# Patient Record
Sex: Male | Born: 1950 | Race: Black or African American | Hispanic: No | Smoking: Former smoker
Health system: Southern US, Community
[De-identification: ages and names within clinical notes are randomized; demographics above are authoritative.]

## PROBLEM LIST (undated history)

## (undated) DIAGNOSIS — C01 Malignant neoplasm of base of tongue: Secondary | ICD-10-CM

## (undated) DIAGNOSIS — R221 Localized swelling, mass and lump, neck: Secondary | ICD-10-CM

## (undated) DIAGNOSIS — C109 Malignant neoplasm of oropharynx, unspecified: Principal | ICD-10-CM

## (undated) DIAGNOSIS — K219 Gastro-esophageal reflux disease without esophagitis: Secondary | ICD-10-CM

## (undated) HISTORY — DX: Malignant neoplasm of oropharynx, unspecified: C10.9

## (undated) HISTORY — DX: Malignant neoplasm of base of tongue: C01

---

## 2014-07-27 ENCOUNTER — Emergency Department (HOSPITAL_COMMUNITY)
Admission: EM | Admit: 2014-07-27 | Discharge: 2014-07-27 | Disposition: A | Discharge status: Home / Self Care | Payer: Medicaid Other | Attending: Emergency Medicine | Admitting: Emergency Medicine

## 2014-07-27 ENCOUNTER — Encounter (HOSPITAL_COMMUNITY): Payer: Self-pay | Admitting: Emergency Medicine

## 2014-07-27 ENCOUNTER — Emergency Department (HOSPITAL_COMMUNITY): Payer: Medicaid Other

## 2014-07-27 DIAGNOSIS — C109 Malignant neoplasm of oropharynx, unspecified: Secondary | ICD-10-CM | POA: Insufficient documentation

## 2014-07-27 DIAGNOSIS — Z720 Tobacco use: Secondary | ICD-10-CM | POA: Insufficient documentation

## 2014-07-27 DIAGNOSIS — R221 Localized swelling, mass and lump, neck: Secondary | ICD-10-CM | POA: Diagnosis present

## 2014-07-27 DIAGNOSIS — R609 Edema, unspecified: Secondary | ICD-10-CM

## 2014-07-27 LAB — CBC WITH DIFFERENTIAL/PLATELET
Basophils Absolute: 0 10*3/uL (ref 0.0–0.1)
Basophils Relative: 0 % (ref 0–1)
EOS ABS: 0.2 10*3/uL (ref 0.0–0.7)
EOS PCT: 3 % (ref 0–5)
HEMATOCRIT: 43.2 % (ref 39.0–52.0)
HEMOGLOBIN: 14.1 g/dL (ref 13.0–17.0)
Lymphocytes Relative: 45 % (ref 12–46)
Lymphs Abs: 2.1 10*3/uL (ref 0.7–4.0)
MCH: 31.4 pg (ref 26.0–34.0)
MCHC: 32.6 g/dL (ref 30.0–36.0)
MCV: 96.2 fL (ref 78.0–100.0)
Monocytes Absolute: 0.5 10*3/uL (ref 0.1–1.0)
Monocytes Relative: 11 % (ref 3–12)
NEUTROS PCT: 41 % — AB (ref 43–77)
Neutro Abs: 1.9 10*3/uL (ref 1.7–7.7)
Platelets: 205 10*3/uL (ref 150–400)
RBC: 4.49 MIL/uL (ref 4.22–5.81)
RDW: 13.5 % (ref 11.5–15.5)
WBC: 4.7 10*3/uL (ref 4.0–10.5)

## 2014-07-27 LAB — BASIC METABOLIC PANEL
Anion gap: 7 (ref 5–15)
BUN: 8 mg/dL (ref 6–20)
CO2: 31 mmol/L (ref 22–32)
Calcium: 9.2 mg/dL (ref 8.9–10.3)
Chloride: 103 mmol/L (ref 101–111)
Creatinine, Ser: 0.96 mg/dL (ref 0.61–1.24)
GFR calc Af Amer: >60 mL/min (ref >60)
GFR calc non Af Amer: >60 mL/min (ref >60)
Glucose, Bld: 88 mg/dL (ref 70–99)
POTASSIUM: 3.7 mmol/L (ref 3.5–5.1)
SODIUM: 141 mmol/L (ref 135–145)

## 2014-07-27 LAB — LACTIC ACID, PLASMA: LACTIC ACID, VENOUS: 0.8 mmol/L (ref 0.5–2.0)

## 2014-07-27 MED ORDER — IOHEXOL 300 MG/ML  SOLN
100.0000 mL | Freq: Once | INTRAMUSCULAR | Status: AC | PRN
  Administered 2014-07-27: 75 mL via INTRAVENOUS

## 2014-07-27 NOTE — ED Notes (Signed)
Pt reports neck swelling x1 month. Pt reports "all of this started off with a sore throat." airway patent. Large mass noted to left side of pt neck.

## 2014-07-27 NOTE — ED Notes (Signed)
Consulted EDP concerning pt presentation and neck swelling. EDP give verbal order for cbc and bmet.

## 2014-07-27 NOTE — Discharge Instructions (Signed)
°Emergency Department Resource Guide °1) Find a Doctor and Pay Out of Pocket °Although you won't have to find out who is covered by your insurance plan, it is a good idea to ask around and get recommendations. You will then need to call the office and see if the doctor you have chosen will accept you as a new patient and what types of options they offer for patients who are self-pay. Some doctors offer discounts or will set up payment plans for their patients who do not have insurance, but you will need to ask so you aren't surprised when you get to your appointment. ° °2) Contact Your Local Health Department °Not all health departments have doctors that can see patients for sick visits, but many do, so it is worth a call to see if yours does. If you don't know where your local health department is, you can check in your phone book. The CDC also has a tool to help you locate your state's health department, and many state websites also have listings of all of their local health departments. ° °3) Find a Walk-in Clinic °If your illness is not likely to be very severe or complicated, you may want to try a walk in clinic. These are popping up all over the country in pharmacies, drugstores, and shopping centers. They're usually staffed by nurse practitioners or physician assistants that have been trained to treat common illnesses and complaints. They're usually fairly quick and inexpensive. However, if you have serious medical issues or chronic medical problems, these are probably not your best option. ° °No Primary Care Doctor: °- Call Health Connect at  832-8000 - they can help you locate a primary care doctor that  accepts your insurance, provides certain services, etc. °- Physician Referral Service- 1-800-533-3463 ° °Chronic Pain Problems: °Organization         Address  Phone   Notes  °[facility]  (336) 297-2271 Patients need to be referred by their primary care doctor.  ° °Medication  Assistance: °Organization         Address  Phone   Notes  °[place] [facility] [address] °[place], [place] [address] (336) 641-8030 --Must be a resident of [place] °-- Must have NO insurance coverage whatsoever (no Medicaid/ Medicare, etc.) °-- The pt. MUST have a primary care doctor that directs their care regularly and follows them in the community °  °[facility]  (866) 331-1348   °[employer]  (888) 892-1162   ° °Agencies that provide inexpensive medical care: °Organization         Address  Phone   Notes  °[facility]  (336) 832-8035   °[facility]    (336) 832-7272   °[facility] [address] °[place], [place] [address] (336) 832-4777   °[facility] [address], °[place] (336) 271-4999   °[facility]    (336) 373-0678   °[facility]    (336) 272-1050   °[facility] ° [address], [place] Phone:  (336) 832-4444, Fax:  (336) 832-4440 Hours of Operation:  9 am - 6 pm, M-F.  Also accepts Medicaid/Medicare and self-pay.  °[facility] ° [address], [place] Phone: (336) 832-3150, Fax: (336) [phone]. Hours of Operation:  8:30 am - 5:30 pm, M-F.  Also accepts Medicaid and self-pay.  °[facility] [place] [address], [place] Phone: (336) 878-6027   °[facility] [address], [place], [place] (336)723-1848, Ext. 123 Mondays & Thursdays: 7-9 AM.  First 15 patients are seen on a first come, first serve basis. °  ° °Medicaid-accepting [place] Providers: ° °Organization         Address  Phone   Notes  °[facility] [name], [place] (336) 641-2100 Also accepts self-pay patients.  °[facility] [address], [place] ° (336) 856-9996   °[facility] [address], [place]  (336) 288-8857   °[facility] [address] [place] [address], [place] (336) 299-7000   °Veita Bland [address], [place]  ° (336) 373-1557 Only accepts [place] Medicaid patients after they have their name applied to their card.  ° °Self-Pay (no insurance) in [place]: ° °Organization         Address  Phone   Notes  °Sickle Cell Patients, [facility] [address], [place] (336) 832-1970   °[facility] [address], [place] (336) 832-4400   °[facility] ° [address], [place] (336) 992-4800   °[facility]/Dr. Osei-Bonsu ° [address] [place] [address], [place] or [address], [place] (336) [phone] Phone number for both [place] and [place] locations is the same.  °[facility] [address], [place] (336) 299-0000   °[facility] [address] [place] [address], [place] or [address] (336) 852-7530 °(336) 878-2260   °[facility] [address], [place] (336) 350-1642, phone; (336) 294-5005, fax Sees patients 1st and 3rd Saturday of every month.  Must not qualify for public or private insurance (i.e. Medicaid, Medicare, [place] Health Choice, Veterans' Benefits) • Household income should be no more than 200% of the poverty level •The clinic cannot treat you if you are pregnant or think you are pregnant • Sexually transmitted diseases are not treated at the clinic.  ° ° °Dental Care: °Organization         Address  Phone  Notes  °[place] [facility] [address], [place] (336) 641-6152 Accepts children up to age 21 who are enrolled in Medicaid or [place] Health Choice; pregnant women with a Medicaid card; and children who have applied for Medicaid or [place] Health Choice, but were declined, whose parents can pay a reduced fee at time of service.  °[place]  [facility] [place]  [address], [place] (336) 641-7733 Accepts children up to age 21 who are enrolled in Medicaid or [place] Health Choice; pregnant women with a Medicaid card; and children who have applied for Medicaid or [place] Health Choice, but were declined, whose parents can pay a reduced fee at time of service.  °[facility] ° [address],  (336) 641-4533 Patients are seen by appointment only. Walk-ins are not accepted. [facility] will see patients 18 years of age and older. °Monday - Tuesday (8am-5pm) °Most Wednesdays (8:30-5pm) °$30 per visit, cash only  °[facility] ° [address], [place] (336) 641-4533 Patients are seen by appointment only. Walk-ins are not accepted. [facility] will see patients 18 years of age and older. °One   Wednesday Evening (Monthly: Volunteer Based).  $30 per visit, cash only  °[facility]  (919) [phone] for adults; Children under age 4, call Graduate Pediatric Dentistry at ([phone]) [phone]. Children aged 4-14, please call (919) [phone] to request a pediatric application. ° Dental services are provided in all areas of dental care including fillings, crowns and bridges, complete and partial dentures, implants, gum treatment, root canals, and extractions. Preventive care is also provided. Treatment is provided to both adults and children. °Patients are selected via a lottery and there is often a waiting list. °  °[facility] [address], °[place] ° (336) 763-8833 [email or web address].[email or web address] °  °[facility] [address], [place], [place] (336)723-1848, Ext. 123 Second and Fourth Thursday of each month, opens at 6:30 AM; Clinic ends at 9 AM.  Patients are seen on a first-come first-served basis, and a limited number are seen during each clinic.  ° °[facility] ° [address], [place], [place] (336) [phone]    Eligibility Requirements °You must have lived in [place], [place], or [place] counties for at least the last three months. °  You cannot be eligible for state or federal sponsored healthcare insurance, including Veterans Administration, Medicaid, or Medicare. °  You generally cannot be eligible for healthcare insurance through your employer.  °  How to apply: °Eligibility screenings are held every Tuesday and Wednesday afternoon from 1:00 pm until 4:00 pm. You do not need an appointment for the interview!  °[facility] [address], [place], [place] 336-631-2330   °[facility]  336-342-8273   °[facility]  336-703-3100   °[facility]  336-570-6415   ° °Behavioral Health Resources in the Community: °Intensive Outpatient Programs °Organization         Address  Phone  Notes  °[facility] [address], [place], [place] 336-878-6098   °[facility] [address], [place], [place] 336-832-9800   °[facility] [address], [place], [place] ° 336-882-2125   °[facility] [address],  °[place], [place] 1-800-853-5163 or 336-641-4981   °Substance Abuse Resources °Organization         Address  Phone  Notes  °[facility]  336-882-2125   °[facility]  336-784-9470   °[facility]  336-285-9073   °[facility]  336-845-3988   °Residential & Outpatient Substance Abuse Program  1-800-659-3381   °Psychological Services °Organization         Address  Phone  Notes  °[facility]  336- 832-9600   °[facility]  336- 378-7881   °[facility] [address], [place] 1-800-853-5163 or 336-641-4981   ° °Mobile Crisis Teams °Organization         Address  Phone  Notes  °[facility], Mobile Crisis Care Unit  1-877-626-1772   °Assertive °[facility] ° [address]  [place], [place] 336-834-9664   °Sharon DeEsch [address] °[place] 336-554-5454   ° °Self-Help/Support Groups °Organization         Address  Phone             Notes  °[facility] [place] - variety of support groups  336- 373-1402 Call for more information  °Narcotics Anonymous (NA), [facility] [address], °[place]  2 meetings at this location  ° °  Residential Treatment Programs Organization         Address  Phone  Notes  [facility] [address]    [place]  1-251 833 1821   [facility]  [address], [place] [address], [place], [place]   [place], [place] [phone] Admissions: 8am-3pm M-F  [facility] [place] [address],    [place], [place] [phone]   [facility] [address] [place], [place], [place]   [facility] [address]  [place], [place]   [facility] Intensive Outpatient [address], [place], [place]   [facility] ([place].,  [place], [place] 1-743-038-5469 or 267-234-7362   [facility] ([facility] [address] [place], [place] Accepts Medicaid  [facility] [address],  [place] 1-724-778-1069 Substance Abuse/Addiction Treatment   [facility] Organization         Address  Phone  Notes  [facility]  (323) 274-5250   Domenic Schwab, PhD [address] [place], [place]   845-500-2011 or [phone]   [facility], [place] [phone]   [facility] [address] [place], [place] [phone] Insurance/Medicaid/sponsorship through [facility] Families [address]                                    [place], [place] [phone] [place] [address], [place] [phone]    Dr. Adele Schilder  ([phone]) [phone]   [facility] 1) [address], [place] 2) [place] 3)  [place] [address], [place] [phone]  [phone]   [place] (272)[phone] or [phone] (After Hours)     Take your usual prescriptions as previously directed. Try to stop smoking. The [facility] office will call you tomorrow to schedule a follow up appointment to further evaluate the findings on your CT scan today.  Return to the Emergency Department immediately sooner if worsening.

## 2014-07-27 NOTE — ED Provider Notes (Signed)
CSN: [id number]     Arrival date & time 07/27/14  1708 History   First MD Initiated Contact with Patient 07/27/14 1755     Chief Complaint  Patient presents with  . Edema    left neck      HPI Pt was seen at 1810. Per pt, c/o gradual onset and worsening of persistent sore throat and left sided neck "swelling" for the past 1+ month. Pt states "it all started with a sore throat." States his sore throat "comes and goes," but the left side of his neck has progressively "swollen." Pt states "sometimes it feels like food gets stuck over there" but he has been able to tol PO food and fluids without dysphagia or choking. Denies dental pain, no injury, no focal motor weakness, no tingling/numbness in extremities, no neck pain, no CP/SOB, no fevers, no rash.    History reviewed. No pertinent past medical history.   History reviewed. No pertinent past surgical history.  History  Substance Use Topics  . Smoking status: Current Every Day Smoker -- 0.50 packs/day  . Smokeless tobacco: Not on file  . Alcohol Use: No    Review of Systems ROS: Statement: All systems negative except as marked or noted in the HPI; Constitutional: Negative for fever and chills. ; ; Eyes: Negative for eye pain, redness and discharge. ; ; ENMT: Negative for ear pain, hoarseness, nasal congestion, sinus pressure and +sore throat, +left neck swelling.. ; ; Cardiovascular: Negative for chest pain, palpitations, diaphoresis, dyspnea and peripheral edema. ; ; Respiratory: Negative for cough, wheezing and stridor. ; ; Gastrointestinal: Negative for nausea, vomiting, diarrhea, abdominal pain, blood in stool, hematemesis, jaundice and rectal bleeding. . ; ; Genitourinary: Negative for dysuria, flank pain and hematuria. ; ; Musculoskeletal: Negative for back pain and neck pain. Negative for deformity and trauma.; ; Skin: Negative for pruritus, rash, abrasions, blisters, bruising and skin lesion.; ; Neuro: Negative for headache,  lightheadedness and neck stiffness. Negative for weakness, altered level of consciousness , altered mental status, extremity weakness, paresthesias, involuntary movement, seizure and syncope.      Allergies  Review of patient's allergies indicates no known allergies.  Home Medications   Prior to Admission medications   Medication Sig Start Date End Date Taking? Authorizing Provider  naproxen sodium (ANAPROX) 220 MG tablet Take 440 mg by mouth daily as needed (for pain).   Yes Historical Provider, MD   BP 155/77 mmHg  Pulse 58  Temp(Src) 98.6 F (37 C) (Oral)  Resp 18  Ht '6\' 1"'$  (1.854 m)  Wt 178 lb (80.74 kg)  BMI 23.49 kg/m2  SpO2 100% Physical Exam  1815: Physical examination: Vital signs and O2 SAT: Reviewed; Constitutional: Well developed, Well nourished, Well hydrated, In no acute distress; Head and Face: Normocephalic, Atraumatic; Eyes: EOMI, PERRL, No scleral icterus; ENMT: Mouth and pharynx normal, Poor dentition, Widespread dental decay, Left TM normal, Right TM normal, Mucous membranes moist. No gingival erythema, edema, fluctuance, or drainage. +left sided soft palate and tonsillar bulging. No submandibular or sublingual edema. No hoarse voice, no drooling, no stridor. No trismus. ; Neck: Supple, Full range of motion. +left sided neck edema. No soft tissue crepitus, no rash, no open wounds.; Cardiovascular: Regular rate and rhythm, No gallop; Respiratory: Breath sounds clear & equal bilaterally, No wheezes, Normal respiratory effort/excursion; Chest: Nontender, Movement normal;  Abdomen: Soft, Nontender, Nondistended, Normal bowel sounds; Genitourinary: No CVA tenderness; Extremities: Pulses normal, No tenderness, No edema, No calf edema or asymmetry.; Neuro:  AA&Ox3, Major CN grossly intact. No facial droop. Speech clear. No gross focal motor or sensory deficits in extremities.; Skin: Color normal, Warm, Dry.     ED Course  Procedures     EKG Interpretation None       MDM  MDM Reviewed: nursing note and vitals Interpretation: labs and CT scan     Results for orders placed or performed during the hospital encounter of 07/27/14  CBC with Differential  Result Value Ref Range   WBC 4.7 4.0 - 10.5 K/uL   RBC 4.49 4.22 - 5.81 MIL/uL   Hemoglobin 14.1 13.0 - 17.0 g/dL   HCT 43.2 39.0 - 52.0 %   MCV 96.2 78.0 - 100.0 fL   MCH 31.4 26.0 - 34.0 pg   MCHC 32.6 30.0 - 36.0 g/dL   RDW 13.5 11.5 - 15.5 %   Platelets 205 150 - 400 K/uL   Neutrophils Relative % 41 (L) 43 - 77 %   Neutro Abs 1.9 1.7 - 7.7 K/uL   Lymphocytes Relative 45 12 - 46 %   Lymphs Abs 2.1 0.7 - 4.0 K/uL   Monocytes Relative 11 3 - 12 %   Monocytes Absolute 0.5 0.1 - 1.0 K/uL   Eosinophils Relative 3 0 - 5 %   Eosinophils Absolute 0.2 0.0 - 0.7 K/uL   Basophils Relative 0 0 - 1 %   Basophils Absolute 0.0 0.0 - 0.1 K/uL  Basic metabolic panel  Result Value Ref Range   Sodium 141 135 - 145 mmol/L   Potassium 3.7 3.5 - 5.1 mmol/L   Chloride 103 101 - 111 mmol/L   CO2 31 22 - 32 mmol/L   Glucose, Bld 88 70 - 99 mg/dL   BUN 8 6 - 20 mg/dL   Creatinine, Ser 0.96 0.61 - 1.24 mg/dL   Calcium 9.2 8.9 - 10.3 mg/dL   GFR calc non Af Amer >60 >60 mL/min   GFR calc Af Amer >60 >60 mL/min   Anion gap 7 5 - 15  Lactic acid, plasma  Result Value Ref Range   Lactic Acid, Venous 0.8 0.5 - 2.0 mmol/L   Ct Soft Tissue Neck W Contrast 07/27/2014   CLINICAL DATA:  Left neck swelling for 1 month.  EXAM: CT NECK WITH CONTRAST  TECHNIQUE: Multidetector CT imaging of the neck was performed using the standard protocol following the bolus administration of intravenous contrast.  CONTRAST:  71m OMNIPAQUE IOHEXOL 300 MG/ML  SOLN  COMPARISON:  None.  FINDINGS: Pharynx and larynx: There is a large, centrally hypo enhancing/necrotic mass with extensive submucosal component, epicenter at the left glossotonsillar sulcus. The mass measures at least 45 mm in diameter, and extends into left greater than right  intrinsic tongue muscles, likely involving the left lingual neurovascular bundle. There is extrinsic tongue muscle involvement on the left; no visible bone erosion, carotid involvement, or pterygoid involvement.  Necrotic lymphadenopathy throughout the left neck, with the jugulodigastric node measuring 5 cm in maximal dimension. This node has macroscopic extra capsular invasion which invades the left internal jugular vein. Necrotic lymphadenopathy continues throughout the left cervical chain and also involves the left posterior triangle. No enlarged or necrotic appearing lymph nodes present on the right.  The stage is at T4a(extrinsic tongue muscle involvement) N2b  Salivary glands: Few stones in the right parotid gland.  Thyroid: Negative  Vascular: Left IJ invasion as above  Limited intracranial: No evidence of metastatic spread.  Visualized orbits: Negative  Mastoids and visualized  paranasal sinuses: Negative  Skeleton: No evidence of direct invasion or osseous metastasis. 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  No nodules.  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 Detailed staging above.   Electronically Signed   By: Monte Fantasia M.D.   On: 07/27/2014 20:09    2020:  T/C to [facility] Med Oncology Dr. Whitney Muse, case discussed, including:  HPI, pertinent PM/SHx, VS/PE, dx testing, ED course and treatment:  States pt does not necessarily need admission for further workup, she will have her office call pt tomorrow morning to schedule f/u appointment for further dx testing, tx, etc. Dx and testing, as well as d/w Med Oncology Dr. Whitney Muse, d/w pt and family.  Questions answered.  Verb understanding. Pt states he would like to go home now. He is agreeable to f/u outpt with [facility] MD and will expect  the office call tomorrow.    Francine Graven, DO 07/30/14 [phone]

## 2014-07-27 NOTE — ED Notes (Signed)
Pt reports that he had a sore throat about a month ago and it started swelling on the left side of his neck.  Reports painless swelling on that side that has gradually increased.  States hard to swallow food but breathing is without difficulty as well as swallowing liquids.

## 2014-07-28 ENCOUNTER — Encounter (HOSPITAL_COMMUNITY): Payer: Self-pay | Admitting: Oncology

## 2014-07-28 ENCOUNTER — Other Ambulatory Visit (HOSPITAL_COMMUNITY): Payer: Self-pay | Admitting: Oncology

## 2014-07-28 DIAGNOSIS — C109 Malignant neoplasm of oropharynx, unspecified: Secondary | ICD-10-CM

## 2014-07-28 HISTORY — DX: Malignant neoplasm of oropharynx, unspecified: C10.9

## 2014-07-30 ENCOUNTER — Other Ambulatory Visit: Payer: Self-pay | Admitting: Otolaryngology

## 2014-07-30 ENCOUNTER — Ambulatory Visit (INDEPENDENT_AMBULATORY_CARE_PROVIDER_SITE_OTHER): Payer: Medicaid Other | Admitting: Otolaryngology

## 2014-07-30 DIAGNOSIS — D3702 Neoplasm of uncertain behavior of tongue: Secondary | ICD-10-CM | POA: Diagnosis not present

## 2014-08-03 ENCOUNTER — Encounter (HOSPITAL_BASED_OUTPATIENT_CLINIC_OR_DEPARTMENT_OTHER): Payer: Self-pay | Admitting: *Deleted

## 2014-08-03 ENCOUNTER — Ambulatory Visit (HOSPITAL_COMMUNITY)
Admission: RE | Admit: 2014-08-03 | Discharge: 2014-08-03 | Disposition: A | Discharge status: Home / Self Care | Payer: Medicaid Other | Source: Ambulatory Visit | Attending: Oncology | Admitting: Oncology

## 2014-08-03 ENCOUNTER — Encounter (HOSPITAL_COMMUNITY)
Admission: RE | Admit: 2014-08-03 | Discharge: 2014-08-03 | Disposition: A | Discharge status: Home / Self Care | Payer: Medicaid Other | Source: Ambulatory Visit | Attending: Oncology | Admitting: Oncology

## 2014-08-03 ENCOUNTER — Encounter (HOSPITAL_COMMUNITY): Payer: Self-pay

## 2014-08-03 DIAGNOSIS — C109 Malignant neoplasm of oropharynx, unspecified: Secondary | ICD-10-CM | POA: Insufficient documentation

## 2014-08-03 MED ORDER — IOHEXOL 300 MG/ML  SOLN
100.0000 mL | Freq: Once | INTRAMUSCULAR | Status: AC | PRN
  Administered 2014-08-03: 100 mL via INTRAVENOUS

## 2014-08-03 MED ORDER — TECHNETIUM TC 99M MEDRONATE IV KIT
25.0000 | PACK | Freq: Once | INTRAVENOUS | Status: AC | PRN
  Administered 2014-08-03: 25 via INTRAVENOUS

## 2014-08-05 ENCOUNTER — Telehealth (HOSPITAL_COMMUNITY): Payer: Self-pay | Admitting: Oncology

## 2014-08-05 ENCOUNTER — Encounter (HOSPITAL_COMMUNITY): Payer: Self-pay | Admitting: Oncology

## 2014-08-05 ENCOUNTER — Encounter (HOSPITAL_COMMUNITY): Payer: Self-pay | Admitting: Lab

## 2014-08-05 ENCOUNTER — Encounter (HOSPITAL_COMMUNITY): Payer: Medicaid Other | Attending: Oncology | Admitting: Oncology

## 2014-08-05 VITALS — BP 124/71 | HR 58 | Temp 98.8°F | Resp 16 | Wt 153.5 lb

## 2014-08-05 DIAGNOSIS — C01 Malignant neoplasm of base of tongue: Secondary | ICD-10-CM | POA: Diagnosis not present

## 2014-08-05 DIAGNOSIS — C109 Malignant neoplasm of oropharynx, unspecified: Secondary | ICD-10-CM

## 2014-08-05 DIAGNOSIS — Z87891 Personal history of nicotine dependence: Secondary | ICD-10-CM

## 2014-08-05 MED ORDER — OXYCODONE-ACETAMINOPHEN 5-325 MG PO TABS
2.0000 | ORAL_TABLET | Freq: Four times a day (QID) | ORAL | Status: DC | PRN
Start: 2014-08-05 — End: 2014-08-12

## 2014-08-05 NOTE — Progress Notes (Signed)
Referral to Dr [name] port and peg/  [facility] [place],  Nutrition [name], Referral to [facility] ( they will contact patient from rehab due to scheduling speech)

## 2014-08-05 NOTE — Progress Notes (Signed)
Referral sent to Dr Enrique Sack.  I talked to Lattie Haw at office on 5/11 and Dr to review records and will contact patient with appt.

## 2014-08-05 NOTE — Telephone Encounter (Signed)
SPOKE WITH PT AND QUESTIONED IF HE HAS APPLIED FOR MEDICARE/MCD/DSS. PER PT HE HAS ON/ABOUT 08/02/14 [facility] [phone]

## 2014-08-05 NOTE — Progress Notes (Addendum)
[facility] Hematology/Oncology Consultation   Name: Alexander [name]      MRN: [id number]    Location: Room/bed info not found  Date: 08/05/2014 Time:8:38 AM   REFERRING PHYSICIAN:  Francine Graven, DO  REASON FOR CONSULT:  Advanced oropharyngeal cancer.   DIAGNOSIS:  Stage IVA Oropharyngeal Cancer  HISTORY OF PRESENT ILLNESS:   Alexander [name] is a 64 year old black American with an insignificant past medical history who presented to the [facility] ED on 07/27/2014 with left neck swelling.  This was worked up by Dr. Thurnell Garbe which included a CT of neck that was impressive for findings suggestive of malignancy.  Since the patient was stable and not meeting criteria for hospitalization, Dr. Thurnell Garbe reached out to [facility] and we have navigated the patient through his diagnosis.  Upon learning of the patient, I got him set-up for bone scan and CT CAP.  He has also met with ENT, Dr. Benjamine Mola, who is planning biopsy tomorrow, 08/06/2014 for official diagnosis and histology.  I personally reviewed and went over laboratory results with the patient.  The results are noted within this dictation.  I personally reviewed and went over laboratory results with the patient.  The results are noted within this dictation.  The patient reports a 13 lb weight loss x 2 months secondary to soreness with eating.  He notes that his appetite is strong, but when he masticated, he experienced soreness that prevented him from eating.  He reports that the discomfort is improved with pain medication provided by Dr. Benjamine Mola.  He reports that he is supplementing his intake with Ensure.  He admits to 1/2 ppd smoking history, quitting 1 week ago, x 30+ years.  He denies heavy EtOH abuse, but admits to a history of 1-2 beers on weekends.  He did serve in the [employer].  His mother is deceased at the age of 56 from spinal meningitis, his father is deceased at the age of 42 from "young girls."  The exact mechanism of his death is  unknown.  He has 5 children, 4 sons, and 1 daughter.  His oldest son is 3 years old his his youngest child is 23 years old.  All are healthy to his knowledge.  He live with his wife part-time.  He denies any home medications and has not seen a physician in 20+ years.  I have reviewed his staging which is Stage IVA ([id number]) oropharyngeal cancer.  We briefly discussed treatment which will including concomitant chemoradiation with Cisplatin, including secondary procedures including port placement, PEG placement, nutritionist evaluation, and speech pathology evaluation via [facility].  He is given information regarding head and neck cancer.     PAST MEDICAL HISTORY:   Past Medical History  Diagnosis Date  . Oropharyngeal cancer 07/28/2014    ALLERGIES: No Known Allergies    MEDICATIONS: I have reviewed the patient's current medications.     PAST SURGICAL HISTORY No past surgical history on file.  FAMILY HISTORY: No family history on file.  SOCIAL HISTORY:  reports that he has quit smoking. He does not have any smokeless tobacco history on file. He reports that he does not drink alcohol or use illicit drugs.  PERFORMANCE STATUS: The patient's performance status is 0 - Asymptomatic  PHYSICAL EXAM: Most Recent Vital Signs: There were no vitals taken for this visit. General appearance: alert, cooperative, appears stated age, no distress and accompanied by wife and family friend. Head: Normocephalic, without  obvious abnormality, atraumatic Eyes: conjunctivae/corneas clear. PERRL, EOM's intact. Fundi benign. Throat: normal findings: lips normal without lesions, buccal mucosa normal and palate normal and abnormal findings: multiple missing teeth Neck: supple, symmetrical, trachea midline and left anterior cervical chain lymph node measuring 5 cm clinically with left supraclavilcular nodes, largest medially Back: symmetric, no curvature. ROM normal. No CVA tenderness. Lungs: clear to  auscultation bilaterally and normal percussion bilaterally Heart: regular rate and rhythm, S1, S2 normal, no murmur, click, rub or gallop Abdomen: soft, non-tender; bowel sounds normal; no masses,  no organomegaly Extremities: extremities normal, atraumatic, no cyanosis or edema Pulses: 2+ and symmetric Skin: Skin color, texture, turgor normal. No rashes or lesions Lymph nodes: as stated above Neurologic: Alert and oriented X 3, normal strength and tone. Normal symmetric reflexes. Normal coordination and gait  LABORATORY DATA:  No results found for this or any previous visit (from the past 48 hour(s)).    RADIOGRAPHY: Ct Chest W Contrast  08/03/2014   CLINICAL DATA:  Subsequent encounter for head and neck cancer.  EXAM: CT CHEST, ABDOMEN, AND PELVIS WITH CONTRAST  TECHNIQUE: Multidetector CT imaging of the chest, abdomen and pelvis was performed following the standard protocol during bolus administration of intravenous contrast.  CONTRAST:  132m OMNIPAQUE IOHEXOL 300 MG/ML  SOLN  COMPARISON:  None.  FINDINGS: CT CHEST FINDINGS  Mediastinum/Nodes: No axillary lymphadenopathy. Left supraclavicular lymphadenopathy was better seen on the previous neck CT. No mediastinal or hilar lymphadenopathy. Heart size is normal. No pericardial effusion.  Lungs/Pleura: Lung windows demonstrate centrilobular and paraseptal emphysema. No pulmonary edema or focal airspace consolidation. No suspicious pulmonary parenchymal nodule or mass. No evidence for pleural effusion.  Musculoskeletal: Bone windows reveal no worrisome lytic or sclerotic osseous lesions.  CT ABDOMEN AND PELVIS FINDINGS  Hepatobiliary: Multiple small cysts are seen scattered in the liver parenchyma. There is no evidence for gallstones, gallbladder wall thickening, or pericholecystic fluid. No intrahepatic or extrahepatic biliary dilation.  Pancreas: No focal mass lesion. No dilatation of the main duct. No intraparenchymal cyst. No peripancreatic edema.   Spleen: No splenomegaly. No focal mass lesion.  Adrenals/Urinary Tract: No adrenal nodule or mass. Kidneys are unremarkable. No evidence for hydroureter. Urinary bladder is normal by CT imaging.  Stomach/Bowel: Stomach is nondistended. No gastric wall thickening. No evidence of outlet obstruction. Duodenum is normally positioned as is the ligament of Treitz. No small bowel wall thickening. No small bowel dilatation. Terminal ileum is normal. The appendix is normal. No gross colonic mass. No colonic wall thickening. No substantial diverticular change.  Vascular/Lymphatic: There is abdominal aortic atherosclerosis without aneurysm. No gastrohepatic or hepatoduodenal ligament lymphadenopathy. No retroperitoneal lymphadenopathy appears no evidence for pelvic sidewall lymphadenopathy.  Reproductive: Prostate gland and seminal vesicles are unremarkable.  Other: No intraperitoneal free fluid.  Musculoskeletal: Bone windows reveal no worrisome lytic or sclerotic osseous lesions. Nonacute fractures of the anterior left sixth through ninth ribs are evident. No evidence of these fractures are pathologic and the presence on contiguous ribs would be more consistent with trauma.  IMPRESSION: 1. Left supraclavicular lymphadenopathy is been incompletely visualized. This was better seen on the previous neck CT from 07/27/2014. 2. Otherwise, no evidence for metastatic disease in the chest, abdomen, or pelvis.   Electronically Signed   By: EMisty StanleyM.D.   On: 08/03/2014 16:19   Nm Bone Scan Whole Body  08/03/2014   CLINICAL DATA:  Oropharyngeal cancer newly diagnosed, staging, no osseous pain  EXAM: NUCLEAR MEDICINE WHOLE BODY BONE SCAN  TECHNIQUE: Whole body anterior and posterior images were obtained approximately 3 hours after intravenous injection of radiopharmaceutical.  RADIOPHARMACEUTICALS:  25 mCi of Technetium-3mMDP IV  COMPARISON:  None  Radiographic correlation:  CT neck 07/27/2014  FINDINGS: Uptake at multiple  adjacent anterior LEFT ribs approximately sixth seventh and eighth likely representing fractures.  Questionable foci of abnormal uptake at posterior RIGHT ribs approximately eighth and ninth ribs, nonspecific.  No additional abnormal sites of osseous tracer accumulation are identified which are suspicious for metastatic disease.  Minimal uptake at scattered joints typically degenerative.  Expected urinary tract and soft tissue distribution of tracer.  IMPRESSION: Uptake at adjacent anterior LEFT sixth seventh and eighth ribs likely representing trauma/fractures.  Questionable nonspecific increased tracer localization at the posterior RIGHT eighth and ninth ribs, the adjacent nature which raises a question of trauma as well; recommend correlation with ordered followup imaging (CT chest abdomen pelvis).   Electronically Signed   By: MLavonia DanaM.D.   On: 08/03/2014 13:54   Ct Abdomen Pelvis W Contrast  08/03/2014   CLINICAL DATA:  Subsequent encounter for head and neck cancer.  EXAM: CT CHEST, ABDOMEN, AND PELVIS WITH CONTRAST  TECHNIQUE: Multidetector CT imaging of the chest, abdomen and pelvis was performed following the standard protocol during bolus administration of intravenous contrast.  CONTRAST:  1026mOMNIPAQUE IOHEXOL 300 MG/ML  SOLN  COMPARISON:  None.  FINDINGS: CT CHEST FINDINGS  Mediastinum/Nodes: No axillary lymphadenopathy. Left supraclavicular lymphadenopathy was better seen on the previous neck CT. No mediastinal or hilar lymphadenopathy. Heart size is normal. No pericardial effusion.  Lungs/Pleura: Lung windows demonstrate centrilobular and paraseptal emphysema. No pulmonary edema or focal airspace consolidation. No suspicious pulmonary parenchymal nodule or mass. No evidence for pleural effusion.  Musculoskeletal: Bone windows reveal no worrisome lytic or sclerotic osseous lesions.  CT ABDOMEN AND PELVIS FINDINGS  Hepatobiliary: Multiple small cysts are seen scattered in the liver parenchyma.  There is no evidence for gallstones, gallbladder wall thickening, or pericholecystic fluid. No intrahepatic or extrahepatic biliary dilation.  Pancreas: No focal mass lesion. No dilatation of the main duct. No intraparenchymal cyst. No peripancreatic edema.  Spleen: No splenomegaly. No focal mass lesion.  Adrenals/Urinary Tract: No adrenal nodule or mass. Kidneys are unremarkable. No evidence for hydroureter. Urinary bladder is normal by CT imaging.  Stomach/Bowel: Stomach is nondistended. No gastric wall thickening. No evidence of outlet obstruction. Duodenum is normally positioned as is the ligament of Treitz. No small bowel wall thickening. No small bowel dilatation. Terminal ileum is normal. The appendix is normal. No gross colonic mass. No colonic wall thickening. No substantial diverticular change.  Vascular/Lymphatic: There is abdominal aortic atherosclerosis without aneurysm. No gastrohepatic or hepatoduodenal ligament lymphadenopathy. No retroperitoneal lymphadenopathy appears no evidence for pelvic sidewall lymphadenopathy.  Reproductive: Prostate gland and seminal vesicles are unremarkable.  Other: No intraperitoneal free fluid.  Musculoskeletal: Bone windows reveal no worrisome lytic or sclerotic osseous lesions. Nonacute fractures of the anterior left sixth through ninth ribs are evident. No evidence of these fractures are pathologic and the presence on contiguous ribs would be more consistent with trauma.  IMPRESSION: 1. Left supraclavicular lymphadenopathy is been incompletely visualized. This was better seen on the previous neck CT from 07/27/2014. 2. Otherwise, no evidence for metastatic disease in the chest, abdomen, or pelvis.   Electronically Signed   By: ErMisty Stanley.D.   On: 08/03/2014 16:19      CLINICAL DATA: Left neck swelling for 1 month.  EXAM: CT NECK  WITH CONTRAST  TECHNIQUE: Multidetector CT imaging of the neck was performed using the standard protocol following the bolus  administration of intravenous contrast.  CONTRAST: 80m OMNIPAQUE IOHEXOL 300 MG/ML SOLN  COMPARISON: None.  FINDINGS: Pharynx and larynx: There is a large, centrally hypo enhancing/necrotic mass with extensive submucosal component, epicenter at the left glossotonsillar sulcus. The mass measures at least 45 mm in diameter, and extends into left greater than right intrinsic tongue muscles, likely involving the left lingual neurovascular bundle. There is extrinsic tongue muscle involvement on the left; no visible bone erosion, carotid involvement, or pterygoid involvement.  Necrotic lymphadenopathy throughout the left neck, with the jugulodigastric node measuring 5 cm in maximal dimension. This node has macroscopic extra capsular invasion which invades the left internal jugular vein. Necrotic lymphadenopathy continues throughout the left cervical chain and also involves the left posterior triangle. No enlarged or necrotic appearing lymph nodes present on the right.  The stage is at T4a(extrinsic tongue muscle involvement) N2b  Salivary glands: Few stones in the right parotid gland.  Thyroid: Negative  Vascular: Left IJ invasion as above  Limited intracranial: No evidence of metastatic spread.  Visualized orbits: Negative  Mastoids and visualized paranasal sinuses: Negative  Skeleton: No evidence of direct invasion or osseous metastasis. 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 No nodules.  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 Detailed staging above.   Electronically Signed  By: JMonte FantasiaM.D.  On: 07/27/2014 20:09 PATHOLOGY:  TBD  ASSESSMENT:  1. Stage IVA (([id number] oropharyngeal cancer, with upcoming date for biopsy via  Dr. TBenjamine Mola(ENT).     PLAN:  1. I personally reviewed and went over laboratory results with the patient.  The results are noted within this dictation. 2. I personally reviewed and went over radiographic studies with the patient.  The results are noted within this dictation.   3. Chart reviewed 4. Information regaridng head and neck cancer provided. 5. Referral to Gen Surgery for port placement and PEG placement. 6. [place]. Nutrition[facility] referral [facility]. Speech pathology referral 10. Referral to Dr. KEnrique Sackfor consideration of tooth extraction prior to treatment for Oropharyngeal Ca. 11. Biopsy tomorrow by Dr. TBenjamine Molaas planned.  12. Brief discussion regarding stage of cancer, disease history, and treatment which will include concomitant chemoradiation with Cisplatin single-agent. 13. Rx for Percocet provided 14. Return in 10 days for follow-up.  All questions were answered. The patient knows to call the clinic with any problems, questions or concerns. We can certainly see the patient much sooner if necessary.  Patient and plan discussed with Dr. SAncil Linseyand she is in agreement with the aforementioned.   More than 50% of the time spent with the patient was utilized for counseling and coordination of care.  This note is electronically signed by: KRobynn Pane5/01/2015 8:38 AM   This note is seen and examined, I agree with the details as documented above. Unfortunately he appears to presented with a stage IV locally advanced base of tongue cancer. He has been referred to Dr. TBenjamine Molafor biopsy and additional evaluation. He has upcoming appointments with dentistry, radiation oncology, speech pathology, nutrition, and general surgery. He is anxious to proceed with treatment and we discussed his treatment briefly today. I advised the patient that we will be seeing  him weekly throughout therapy. I advised that he will need and receive  formal chemotherapy teaching prior to the initiation of any treatment. He was given reading information today and I have encouraged him to write down any questions and bring them to follow-up. This point I anticipate he will receive concurrent single agent cisplatin with radiation, cisplatin being given on days 1, 22, and 43 of therapy. The patient and I will again address this further at his next visit. Molli Hazard, MD

## 2014-08-05 NOTE — Patient Instructions (Signed)
..  [place] at [facility] Discharge Instructions  RECOMMENDATIONS MADE BY THE CONSULTANT AND ANY TEST RESULTS WILL BE SENT TO YOUR REFERRING PHYSICIAN.  Exam today with Robynn Pane, PA-C and Dr. Whitney Muse Referrals made to Dr. [name] for port-a-cath and PEG tube placement, nutritionist consult, [facility] program, speech pathology, Dr. Enrique Sack, and Radiation.  Expect phone calls from each one for appointment scheduling. Keep appointment with Dr. Benjamine Mola tomorrow for biopsy. Return in 10 days, here, for follow up appointment. Call for any new or worsening symptoms, questions, or concerns.    Thank you for choosing [place] at [facility] to provide your oncology and hematology care.  To afford each patient quality time with our provider, please arrive at least 15 minutes before your scheduled appointment time.    You need to re-schedule your appointment should you arrive 10 or more minutes late.  We strive to give you quality time with our providers, and arriving late affects you and other patients whose appointments are after yours.  Also, if you no show three or more times for appointments you may be dismissed from the clinic at the providers discretion.     Again, thank you for choosing [facility].  Our hope is that these requests will decrease the amount of time that you wait before being seen by our physicians.       _____________________________________________________________  Should you have questions after your visit to [facility], please contact our office at [phone] between the hours of 8:30 a.m. and 4:30 p.m.  Voicemails left after 4:30 p.m. will not be returned until the following business day.  For prescription refill requests, have your pharmacy contact our office.

## 2014-08-06 ENCOUNTER — Encounter: Payer: Self-pay | Admitting: Dietician

## 2014-08-06 ENCOUNTER — Ambulatory Visit (HOSPITAL_BASED_OUTPATIENT_CLINIC_OR_DEPARTMENT_OTHER): Payer: Medicaid Other | Admitting: Certified Registered"

## 2014-08-06 ENCOUNTER — Encounter (HOSPITAL_BASED_OUTPATIENT_CLINIC_OR_DEPARTMENT_OTHER): Payer: Self-pay | Admitting: *Deleted

## 2014-08-06 ENCOUNTER — Encounter (HOSPITAL_BASED_OUTPATIENT_CLINIC_OR_DEPARTMENT_OTHER)
Admission: RE | Disposition: A | Discharge status: Home / Self Care | Payer: Self-pay | Source: Ambulatory Visit | Attending: Otolaryngology

## 2014-08-06 ENCOUNTER — Ambulatory Visit (HOSPITAL_BASED_OUTPATIENT_CLINIC_OR_DEPARTMENT_OTHER)
Admission: RE | Admit: 2014-08-06 | Discharge: 2014-08-06 | Disposition: A | Discharge status: Home / Self Care | Payer: Medicaid Other | Source: Ambulatory Visit | Attending: Otolaryngology | Admitting: Otolaryngology

## 2014-08-06 DIAGNOSIS — C01 Malignant neoplasm of base of tongue: Secondary | ICD-10-CM | POA: Insufficient documentation

## 2014-08-06 DIAGNOSIS — D3705 Neoplasm of uncertain behavior of pharynx: Secondary | ICD-10-CM | POA: Diagnosis not present

## 2014-08-06 DIAGNOSIS — F17210 Nicotine dependence, cigarettes, uncomplicated: Secondary | ICD-10-CM | POA: Diagnosis not present

## 2014-08-06 DIAGNOSIS — K148 Other diseases of tongue: Secondary | ICD-10-CM | POA: Diagnosis present

## 2014-08-06 HISTORY — DX: Malignant neoplasm of base of tongue: C01

## 2014-08-06 HISTORY — PX: PANENDOSCOPY: SHX2159

## 2014-08-06 LAB — POCT HEMOGLOBIN-HEMACUE: Hemoglobin: 14.9 g/dL (ref 13.0–17.0)

## 2014-08-06 SURGERY — LARYNGOSCOPY, WITH BRONCHOSCOPY AND ESOPHAGOSCOPY
Anesthesia: General | Site: Mouth

## 2014-08-06 MED ORDER — EPINEPHRINE 1 MG/ML IJ SOLN
INTRAMUSCULAR | Status: DC | PRN
  Administered 2014-08-06: 1 mg via TOPICAL

## 2014-08-06 MED ORDER — MIDAZOLAM HCL 2 MG/2ML IJ SOLN: 1.0000 mg | INTRAMUSCULAR | Status: DC | PRN

## 2014-08-06 MED ORDER — HYDROMORPHONE HCL 1 MG/ML IJ SOLN
0.2500 mg | INTRAMUSCULAR | Status: DC | PRN
  Administered 2014-08-06 (×2): 0.5 mg via INTRAVENOUS

## 2014-08-06 MED ORDER — EPINEPHRINE HCL 1 MG/ML IJ SOLN
INTRAMUSCULAR | Status: AC
  Filled 2014-08-06: qty 1

## 2014-08-06 MED ORDER — FENTANYL CITRATE (PF) 100 MCG/2ML IJ SOLN
INTRAMUSCULAR | Status: DC | PRN
  Administered 2014-08-06: 100 ug via INTRAVENOUS

## 2014-08-06 MED ORDER — FENTANYL CITRATE (PF) 100 MCG/2ML IJ SOLN: 50.0000 ug | INTRAMUSCULAR | Status: DC | PRN

## 2014-08-06 MED ORDER — PROPOFOL 10 MG/ML IV BOLUS
INTRAVENOUS | Status: DC | PRN
  Administered 2014-08-06: 50 mg via INTRAVENOUS
  Administered 2014-08-06: 200 mg via INTRAVENOUS

## 2014-08-06 MED ORDER — HYDROMORPHONE HCL 1 MG/ML IJ SOLN
INTRAMUSCULAR | Status: AC
Start: 2014-08-06 — End: 2014-08-06
  Filled 2014-08-06: qty 1

## 2014-08-06 MED ORDER — ONDANSETRON HCL 4 MG/2ML IJ SOLN
INTRAMUSCULAR | Status: DC | PRN
  Administered 2014-08-06: 4 mg via INTRAVENOUS

## 2014-08-06 MED ORDER — SUCCINYLCHOLINE CHLORIDE 20 MG/ML IJ SOLN
INTRAMUSCULAR | Status: DC | PRN
  Administered 2014-08-06: 100 mg via INTRAVENOUS

## 2014-08-06 MED ORDER — DEXAMETHASONE SODIUM PHOSPHATE 4 MG/ML IJ SOLN
INTRAMUSCULAR | Status: DC | PRN
  Administered 2014-08-06: 10 mg via INTRAVENOUS

## 2014-08-06 MED ORDER — LACTATED RINGERS IV SOLN
INTRAVENOUS | Status: DC
  Administered 2014-08-06 (×2): via INTRAVENOUS

## 2014-08-06 MED ORDER — LIDOCAINE HCL (CARDIAC) 20 MG/ML IV SOLN
INTRAVENOUS | Status: DC | PRN
  Administered 2014-08-06: 100 mg via INTRAVENOUS

## 2014-08-06 MED ORDER — GLYCOPYRROLATE 0.2 MG/ML IJ SOLN
0.2000 mg | Freq: Once | INTRAMUSCULAR | Status: AC | PRN
  Administered 2014-08-06: 0.2 mg via INTRAVENOUS

## 2014-08-06 MED ORDER — MIDAZOLAM HCL 2 MG/2ML IJ SOLN
INTRAMUSCULAR | Status: AC
  Filled 2014-08-06: qty 2

## 2014-08-06 MED ORDER — BACITRACIN ZINC 500 UNIT/GM EX OINT
TOPICAL_OINTMENT | CUTANEOUS | Status: AC
  Filled 2014-08-06: qty 0.9

## 2014-08-06 MED ORDER — PROPOFOL 500 MG/50ML IV EMUL
INTRAVENOUS | Status: AC
  Filled 2014-08-06: qty 50

## 2014-08-06 MED ORDER — SUCCINYLCHOLINE CHLORIDE 20 MG/ML IJ SOLN
INTRAMUSCULAR | Status: AC
  Filled 2014-08-06: qty 1

## 2014-08-06 MED ORDER — OXYMETAZOLINE HCL 0.05 % NA SOLN
NASAL | Status: AC
  Filled 2014-08-06: qty 15

## 2014-08-06 MED ORDER — MIDAZOLAM HCL 5 MG/5ML IJ SOLN
INTRAMUSCULAR | Status: DC | PRN
  Administered 2014-08-06: 2 mg via INTRAVENOUS

## 2014-08-06 MED ORDER — FENTANYL CITRATE (PF) 100 MCG/2ML IJ SOLN
INTRAMUSCULAR | Status: AC
  Filled 2014-08-06: qty 4

## 2014-08-06 SURGICAL SUPPLY — 17 items
ADAPTER TUBE FLEX ULTRASET (MISCELLANEOUS) ×3 IMPLANT
GOWN STRL REUS W/ TWL LRG LVL3 (GOWN DISPOSABLE) ×1 IMPLANT
GOWN STRL REUS W/TWL LRG LVL3 (GOWN DISPOSABLE) ×2
GUARD TEETH (MISCELLANEOUS) ×3 IMPLANT
MARKER SKIN DUAL TIP RULER LAB (MISCELLANEOUS) ×3 IMPLANT
NEEDLE HYPO 18GX1.5 BLUNT FILL (NEEDLE) ×3 IMPLANT
NS IRRIG 1000ML POUR BTL (IV SOLUTION) ×3 IMPLANT
PATTIES SURGICAL .5 X3 (DISPOSABLE) ×3 IMPLANT
SHEET MEDIUM DRAPE 40X70 STRL (DRAPES) ×3 IMPLANT
SLEEVE SCD COMPRESS KNEE MED (MISCELLANEOUS) ×3 IMPLANT
SOLUTION BUTLER CLEAR DIP (MISCELLANEOUS) ×3 IMPLANT
SPONGE GAUZE 4X4 12PLY STER LF (GAUZE/BANDAGES/DRESSINGS) ×3 IMPLANT
SYR CONTROL 10ML LL (SYRINGE) ×3 IMPLANT
SYR TB 1ML LL NO SAFETY (SYRINGE) IMPLANT
TOWEL OR 17X24 6PK STRL BLUE (TOWEL DISPOSABLE) ×3 IMPLANT
TUBE CONNECTING 20'X1/4 (TUBING) ×1
TUBE CONNECTING 20X1/4 (TUBING) ×2 IMPLANT

## 2014-08-06 NOTE — Brief Op Note (Signed)
08/06/2014  8:17 AM  PATIENT:  Alexander [name]  64 y.o. male  PRE-OPERATIVE DIAGNOSIS:  LEFT TONGUE BASE MASS  POST-OPERATIVE DIAGNOSIS:  LEFT TONGUE BASE MASS  PROCEDURE:  Procedure(s): 1) Direct laryngoscopy with biopsy 2) Flexible bronchoscopy 3) Rigid esophagoscopy  SURGEON:  Surgeon(s) and Role:    * Leta Baptist, MD - Primary  PHYSICIAN ASSISTANT:   ASSISTANTS: none   ANESTHESIA:   general  EBL:  Total I/O In: 1100 [I.V.:1100] Out: -   BLOOD ADMINISTERED:none  DRAINS: none   LOCAL MEDICATIONS USED:  OTHER Epinephrine  SPECIMEN:  Source of Specimen:  Left oropharyngeal mass  DISPOSITION OF SPECIMEN:  PATHOLOGY  COUNTS:  YES  TOURNIQUET:  * No tourniquets in log *  DICTATION: .Other Dictation: Dictation Number [id number]  PLAN OF CARE: Discharge to home after PACU  PATIENT DISPOSITION:  PACU - hemodynamically stable.   Delay start of Pharmacological VTE agent (>24hrs) due to surgical blood loss or risk of bleeding: not applicable

## 2014-08-06 NOTE — Anesthesia Procedure Notes (Signed)
Procedure Name: Intubation Date/Time: 08/06/2014 7:45 AM Performed by: Baxter Flattery Pre-anesthesia Checklist: Patient identified, Emergency Drugs available, Suction available, Patient being monitored and Timeout performed Patient Re-evaluated:Patient Re-evaluated prior to inductionOxygen Delivery Method: Circle System Utilized Preoxygenation: Pre-oxygenation with 100% oxygen Intubation Type: IV induction Ventilation: Mask ventilation without difficulty Laryngoscope Size: Glidescope and 4 Grade View: Grade II Tube type: Oral Tube size: 7.0 mm Number of attempts: 1 Airway Equipment and Method: Stylet,  LTA kit utilized and Video-laryngoscopy Placement Confirmation: ETT inserted through vocal cords under direct vision,  positive ETCO2 and breath sounds checked- equal and bilateral Secured at: 24 cm Tube secured with: Tape Dental Injury: Teeth and Oropharynx as per pre-operative assessment

## 2014-08-06 NOTE — Discharge Instructions (Addendum)
Resume all previous activities and diet. Pt will follow up in my office in 1 week.    Post Anesthesia Home Care Instructions  Activity: Get plenty of rest for the remainder of the day. A responsible adult should stay with you for 24 hours following the procedure.  For the next 24 hours, DO NOT: -Drive a car -Paediatric nurse -Drink alcoholic beverages -Take any medication unless instructed by your physician -Make any legal decisions or sign important papers.  Meals: Start with liquid foods such as gelatin or soup. Progress to regular foods as tolerated. Avoid greasy, spicy, heavy foods. If nausea and/or vomiting occur, drink only clear liquids until the nausea and/or vomiting subsides. Call your physician if vomiting continues.  Special Instructions/Symptoms: Your throat may feel dry or sore from the anesthesia or the breathing tube placed in your throat during surgery. If this causes discomfort, gargle with warm salt water. The discomfort should disappear within 24 hours.  If you had a scopolamine patch placed behind your ear for the management of post- operative nausea and/or vomiting:  1. The medication in the patch is effective for 72 hours, after which it should be removed.  Wrap patch in a tissue and discard in the trash. Wash hands thoroughly with soap and water. 2. You may remove the patch earlier than 72 hours if you experience unpleasant side effects which may include dry mouth, dizziness or visual disturbances. 3. Avoid touching the patch. Wash your hands with soap and water after contact with the patch.

## 2014-08-06 NOTE — Anesthesia Preprocedure Evaluation (Signed)
Anesthesia Evaluation  Patient identified by MRN, date of birth, ID band Patient awake    Reviewed: Allergy & Precautions, NPO status , Patient's Chart, lab work & pertinent test results  Airway Mallampati: II  TM Distance: >3 FB Neck ROM: Full    Dental   Pulmonary former smoker,  breath sounds clear to auscultation        Cardiovascular negative cardio ROS  Rhythm:Regular Rate:Normal     Neuro/Psych    GI/Hepatic negative GI ROS, Neg liver ROS,   Endo/Other  negative endocrine ROS  Renal/GU negative Renal ROS     Musculoskeletal   Abdominal   Peds  Hematology   Anesthesia Other Findings   Reproductive/Obstetrics                             Anesthesia Physical Anesthesia Plan  ASA: II  Anesthesia Plan: General   Post-op Pain Management:    Induction: Intravenous  Airway Management Planned: Oral ETT  Additional Equipment:   Intra-op Plan:   Post-operative Plan: Extubation in OR  Informed Consent: I have reviewed the patients History and Physical, chart, labs and discussed the procedure including the risks, benefits and alternatives for the proposed anesthesia with the patient or authorized representative who has indicated his/her understanding and acceptance.   Dental advisory given  Plan Discussed with: CRNA and Anesthesiologist  Anesthesia Plan Comments:         Anesthesia Quick Evaluation

## 2014-08-06 NOTE — Progress Notes (Signed)
Was consulted by Medical Oncology regarding this new Head and Neck cancer patient  Contacted Pt by Phone   Wt Readings from Last 10 Encounters:  08/06/14 154 lb 9.6 oz (70.126 kg)  08/05/14 153 lb 8 oz (69.627 kg)  07/27/14 178 lb (80.74 kg)  Patient has reportedly lost 13 lbs x2 months  Patient reports oral intake as poor-fair and is suffering from symptoms including painful swallowing. He has had this for a couple months now. However, today he is s/p a biopsy and he reports reduced pain, possibly from medications he received for the procedure.  He denies any n/v/c/d and has not had trouble with swallowing; it has just been painful  He says he eats 2 meals a day, 2 ensures, and snacks in between. He was interested in the [employer]. Will order him a case for his appt on Monday.  Discussed with pt the benefits of maintaining weight during treatment. We talked about the importance of high calorie, high protein foods and listed a few of them. We talked about some good meal options such as creamy soups. Also discussed eating patterns to maximize intake, such as  Eating 6 small, calorie/protein dense snacks.  Pt seemed to be thankful for the call.   Mailed my contact info, coupons, and handouts titled "Soft and Moist High-Protein Menu Ideas", "Sore Mouth", and "Increasing Protein and Calories"   [name] RD, LDN Nutrition Pager: [phone] 08/06/2014 2:57 PM

## 2014-08-06 NOTE — Anesthesia Postprocedure Evaluation (Signed)
  Anesthesia Post-op Note  Patient: Alexander [name]  Procedure(s) Performed: Procedure(s): PANENDOSCOPY WITH BIOPSY (N/A)  Patient Location: PACU  Anesthesia Type:General  Level of Consciousness: awake  Airway and Oxygen Therapy: Patient Spontanous Breathing  Post-op Pain: mild  Post-op Assessment: Post-op Vital signs reviewed  Post-op Vital Signs: Reviewed  Last Vitals:  Filed Vitals:   08/06/14 0845  BP:   Pulse: 73  Temp:   Resp: 16    Complications: No apparent anesthesia complications

## 2014-08-06 NOTE — Transfer of Care (Signed)
Immediate Anesthesia Transfer of Care Note  Patient: Alexander [name]  Procedure(s) Performed: Procedure(s): PANENDOSCOPY WITH BIOPSY (N/A)  Patient Location: PACU  Anesthesia Type:General  Level of Consciousness: awake, alert , oriented and patient cooperative  Airway & Oxygen Therapy: Patient Spontanous Breathing and Patient connected to face mask oxygen  Post-op Assessment: Report given to RN, Post -op Vital signs reviewed and stable and Patient moving all extremities  Post vital signs: Reviewed and stable  Last Vitals:  Filed Vitals:   08/06/14 0826  BP:   Pulse: 80  Temp:   Resp: 17    Complications: No apparent anesthesia complications

## 2014-08-06 NOTE — Addendum Note (Signed)
Addendum  created 08/06/14 3748 by Baxter Flattery, CRNA   Modules edited: Charges VN

## 2014-08-06 NOTE — H&P (Signed)
Cc: Left neck and tongue masses  HPI: The patient is a 64 year old male who presents today for evaluation of his large left oropharyngeal mass and left neck masses. According to the patient, he has been experiencing left-sided sore throat and left otalgia for the past 2 months.  He noted increasing swelling of his left neck approximately 1 month ago.  He was seen at the [facility]. last week. He underwent a neck CT scan.  The CT showed a large 4 cm left oropharyngeal mass. The patient also has numerous necrotic lymphadenopathies throughout the left neck.  The largest jugular digastric node measured 5 cm in maximal dimension. 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  The patient has a long history of tobacco use.  He has been smoking cigarettes since 64 years of age.  He has lost 5 to 6 lb over the past month.  Currently he complains of dysphagia and odynophagia.  However, he has no dyspnea.  He has no previous history of ENT surgery.   The patient's review of systems (constitutional, eyes, ENT, cardiovascular, respiratory, GI, musculoskeletal, skin, neurologic, psychiatric, endocrine, hematologic, allergic) is noted in the ROS questionnaire.  It is reviewed with the patient.  Family health history: None.   Major events: None.   Ongoing medical problems: Throat cancer.   Social history: The patient is married. He smokes 5-7 cigarettes a day. He denies the  use alcohol or illegal drugs.   Exam General: Communicates without difficulty, well nourished, no acute distress. Head: Normocephalic, no evidence injury, no tenderness, facial buttresses intact without stepoff. Eyes: PERRL, EOMI. No scleral icterus, conjunctivae clear. Neuro: CN II exam reveals vision grossly intact.  No nystagmus at any point of gaze. Ears: Auricles well formed without lesions.  Ear canals are intact without mass or lesion.  No erythema or edema is appreciated.  The TMs are intact  without fluid. Nose: External evaluation reveals normal support and skin without lesions.  Dorsum is intact.  Anterior rhinoscopy reveals healthy pink mucosa over anterior aspect of inferior turbinates and intact septum.  No purulence noted. Oral:  Oral cavity and oropharynx are intact, symmetric, without erythema or edema.  Mucosa is moist without lesions. Neck: Full range of motion without pain.  There is a large left neck mass.   Thyroid bed within normal limits to palpation. Trachea is midline. Neuro:  CN 2-12 grossly intact. Gait normal.   Procedure:  Flexible Fiberoptic Laryngoscopy Risks, benefits, and alternatives of flexible endoscopy were explained to the patient.  Specific mention was made of the risk of throat numbness with difficulty swallowing, possible bleeding from the nose and mouth, and pain from the procedure.  The patient gave oral consent to proceed.  The nasal cavities were decongested and anesthetised with a combination of oxymetazoline and 4% lidocaine solution.  The flexible scope was inserted into the right nasal cavity and advanced towards the nasopharynx.  Visualized mucosa over the turbinates and septum were as described above.  The nasopharynx was clear.  Oropharyngeal walls were symmetric and mobile without lesion, mass, or edema.  Hypopharynx was also without  lesion or edema.  Larynx was mobile without lesions. Supraglottic structures were free of edema, mass, and asymmetry.  True vocal folds were white without mass or lesion.  Base of tongue was noted to be irregular, worse on the left side.  Assessment The patient has a large 4 cm left tongue base mass.  He also has  numerous left neck lymphadenopathies. 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   Plan 1.  The physical exam findings and laryngoscopy findings are reviewed with the patient.   2.  Panendoscopy with biopsy of the tongue base mass.

## 2014-08-06 NOTE — Op Note (Signed)
NAMELADONTE, Alexander [name]              ACCOUNT NO.:  [id number]  MEDICAL RECORD NO.:  [id number]  LOCATION:                                FACILITY:  [facility]  PHYSICIAN:  Leta Baptist, MD            DATE OF BIRTH:  27-Feb-1951  DATE OF PROCEDURE:  08/06/2014 DATE OF DISCHARGE:  08/06/2014                              OPERATIVE REPORT   SURGEON:  Leta Baptist, MD.  PREOPERATIVE DIAGNOSIS:  Left oropharyngeal and neck masses.  POSTOPERATIVE DIAGNOSIS:  Left oropharyngeal and neck masses.  PROCEDURE PERFORMED: 1. Direct laryngoscopy with biopsy of oropharyngeal mass. 2. Bronchoscopy. 3. Rigid esophagoscopy.  ANESTHESIA:  General endotracheal tube anesthesia.  COMPLICATIONS:  None.  ESTIMATED BLOOD LOSS:  Less than 10 mL.  INDICATION FOR PROCEDURE:  The patient is a 64 year old male with 2- month history of left-sided sore throat and enlarging left neck mass. He presented to the [facility] for evaluation of his condition.  His CT scan showed a large 4 cm left oropharyngeal mass.  He was also noted to have numerous necrotic lymphadenopathies throughout his left neck.  The findings were suspicious for metastatic squamous cell carcinoma.  Based on the above findings, the decision was made for the patient to undergo the above stated procedures.  The risks, benefits, alternatives, and details of the procedure were discussed with the patient.  Questions were invited and answered.  Informed consent was obtained.  DESCRIPTION:  The patient was taken to the operating room and placed supine on the operating table.  General endotracheal tube anesthesia was administered by the anesthesiologist.  The patient was positioned and prepped and draped in a standard fashion for panendoscopy.  Attention was first focused on the bronchoscopy examination.  Flexible bronchoscope was inserted via the endotracheal tube into the trachea. It was gradually advanced to the level of the carina.  The main  stem bronchi and segmental takeoffs were examined.  No suspicious mass or lesion was noted.  The bronchoscope was withdrawn.  A rigid esophagoscope was then inserted via the oral cavity into the esophageal inlet.  It was advanced into the esophageal lumen.  No suspicious mass or lesion was noted within the esophageal lumen.  The esophagoscope was withdrawn.  The patient was then focused on the direct laryngoscopy portion of the case.  Dedo laryngoscope was used for examination.  It was advanced through the oral cavity into the pharynx.  Examination of the tongue base showed a large necrotic mass around the junction of the tongue base and the left tonsillar fossa.  The epiglottis was not involved.  The aryepiglottic folds, arytenoids, vocal cords, and piriform sinuses were all free of disease.  The Dedo laryngoscope was then focused on the tongue base mass.  Numerous biopsy specimens were obtained from the tongue mass.  The specimens were sent to the Pathology Department for permanent histologic identification.  Hemostasis was achieved with pledgets soaked with epinephrine.  The care of the patient was turned over to the anesthesiologist.  The patient was awakened from anesthesia without difficulty.  He was extubated and transferred to the recovery room in good condition.  OPERATIVE  FINDINGS: 1. A large necrotic mass was noted at the junction of the left tongue     base and left tonsillar fossa.  The larynx does not appear to be     grossly involved.  SPECIMENS: 1. Oropharyngeal mass biopsy specimens.  FOLLOWUP CARE:  The patient will be discharged home once he is awake and alert.  He will follow up in my office in 1 week.     Leta Baptist, MD     ST/MEDQ  D:  08/06/2014  T:  08/06/2014  Job:  [id number]  cc:   [facility]

## 2014-08-07 ENCOUNTER — Encounter (HOSPITAL_BASED_OUTPATIENT_CLINIC_OR_DEPARTMENT_OTHER): Payer: Self-pay | Admitting: Otolaryngology

## 2014-08-10 ENCOUNTER — Encounter (HOSPITAL_COMMUNITY): Payer: Self-pay | Admitting: Dentistry

## 2014-08-10 ENCOUNTER — Encounter (HOSPITAL_COMMUNITY): Payer: Self-pay

## 2014-08-10 ENCOUNTER — Encounter (HOSPITAL_COMMUNITY)
Admission: RE | Admit: 2014-08-10 | Discharge: 2014-08-10 | Disposition: A | Discharge status: Home / Self Care | Payer: Medicaid Other | Source: Ambulatory Visit | Attending: Dentistry | Admitting: Dentistry

## 2014-08-10 ENCOUNTER — Ambulatory Visit (HOSPITAL_COMMUNITY): Payer: Self-pay | Admitting: Dentistry

## 2014-08-10 ENCOUNTER — Other Ambulatory Visit: Payer: Self-pay

## 2014-08-10 VITALS — BP 121/63 | HR 51 | Temp 98.0°F

## 2014-08-10 DIAGNOSIS — R008 Other abnormalities of heart beat: Secondary | ICD-10-CM | POA: Insufficient documentation

## 2014-08-10 DIAGNOSIS — K0889 Other specified disorders of teeth and supporting structures: Secondary | ICD-10-CM

## 2014-08-10 DIAGNOSIS — K088 Other specified disorders of teeth and supporting structures: Secondary | ICD-10-CM

## 2014-08-10 DIAGNOSIS — Z01812 Encounter for preprocedural laboratory examination: Secondary | ICD-10-CM | POA: Diagnosis not present

## 2014-08-10 DIAGNOSIS — K08409 Partial loss of teeth, unspecified cause, unspecified class: Secondary | ICD-10-CM

## 2014-08-10 DIAGNOSIS — IMO0002 Reserved for concepts with insufficient information to code with codable children: Secondary | ICD-10-CM

## 2014-08-10 DIAGNOSIS — Z01810 Encounter for preprocedural cardiovascular examination: Secondary | ICD-10-CM | POA: Insufficient documentation

## 2014-08-10 DIAGNOSIS — K0530 Chronic periodontitis, unspecified: Secondary | ICD-10-CM

## 2014-08-10 DIAGNOSIS — M264 Malocclusion, unspecified: Secondary | ICD-10-CM

## 2014-08-10 DIAGNOSIS — Z01818 Encounter for other preprocedural examination: Secondary | ICD-10-CM

## 2014-08-10 DIAGNOSIS — K0820 Unspecified atrophy of edentulous alveolar ridge: Secondary | ICD-10-CM

## 2014-08-10 DIAGNOSIS — K030 Excessive attrition of teeth: Secondary | ICD-10-CM

## 2014-08-10 DIAGNOSIS — K036 Deposits [accretions] on teeth: Secondary | ICD-10-CM

## 2014-08-10 DIAGNOSIS — C01 Malignant neoplasm of base of tongue: Secondary | ICD-10-CM

## 2014-08-10 HISTORY — DX: Localized swelling, mass and lump, neck: R22.1

## 2014-08-10 NOTE — Patient Instructions (Signed)
[address] [facility]  08/10/2014   Your procedure is scheduled on:   08-12-2014 [place]  Enter through [facility]  Entrance and follow signs to [facility]. Arrive at   [place]     AM. (Only 1 person to be with you)  Call this number if you have problems the morning of surgery: [phone]  Or Presurgical Testing [phone].   For Living Will and/or Health Care Power Attorney Forms: please provide copy for your medical record,may bring AM of surgery(Forms should be already notarized -we do not provide this service).(08-10-14 No information preferred today).    Do not eat food/ or drink: After Midnight.      Take these medicines the morning of surgery with A SIP OF WATER: NONE.   Do not wear jewelry, make-up or nail polish.  Do not wear deodorant, lotions, powders, or perfumes.   Do not shave legs and under arms- 48 hours(2 days) prior to first CHG shower.(Shaving face and neck okay.)  Do not bring valuables to the hospital.(Hospital is not responsible for lost valuables).  Contacts, dentures or removable bridgework, body piercing, hair pins may not be worn into surgery.  Leave suitcase in the car. After surgery it may be brought to your room.  For patients admitted to the hospital, checkout time is 11:00 AM the day of discharge.(Restricted visitors-Any Persons displaying flu-like symptoms or illness).    Patients discharged the day of surgery will not be allowed to drive home. Must have responsible person with you x 24 hours once discharged.  Name and phone number of your driver: Iris-spouse [phone] cell     Please read over the following fact sheets that you were given:  CHG(Chlorhexidine Gluconate 4% Surgical Soap) use.           [facility] - Preparing for Surgery Before surgery, you can play an important role.  Because skin is not sterile, your skin needs to be as free of germs as possible.  You can reduce the number of germs on your skin by washing with CHG  (chlorahexidine gluconate) soap before surgery.  CHG is an antiseptic cleaner which kills germs and bonds with the skin to continue killing germs even after washing. Please DO NOT use if you have an allergy to CHG or antibacterial soaps.  If your skin becomes reddened/irritated stop using the CHG and inform your nurse when you arrive at Short Stay. Do not shave (including legs and underarms) for at least 48 hours prior to the first CHG shower.  You may shave your face/neck. Please follow these instructions carefully:  1.  Shower with CHG Soap the night before surgery and the  morning of Surgery.  2.  If you choose to wash your hair, wash your hair first as usual with your  normal  shampoo.  3.  After you shampoo, rinse your hair and body thoroughly to remove the  shampoo.                           4.  Use CHG as you would any other liquid soap.  You can apply chg directly  to the skin and wash                       Gently with a scrungie or clean washcloth.  5.  Apply the CHG Soap to your body ONLY FROM THE NECK DOWN.   Do not use  on face/ open                           Wound or open sores. Avoid contact with eyes, ears mouth and genitals (private parts).                       Wash face,  Genitals (private parts) with your normal soap.             6.  Wash thoroughly, paying special attention to the area where your surgery  will be performed.  7.  Thoroughly rinse your body with warm water from the neck down.  8.  DO NOT shower/wash with your normal soap after using and rinsing off  the CHG Soap.                9.  Pat yourself dry with a clean towel.            10.  Wear clean pajamas.            11.  Place clean sheets on your bed the night of your first shower and do not  sleep with pets. Day of Surgery : Do not apply any lotions/deodorants the morning of surgery.  Please wear clean clothes to the hospital/surgery center.  FAILURE TO FOLLOW THESE INSTRUCTIONS MAY RESULT IN THE CANCELLATION OF  YOUR SURGERY PATIENT SIGNATURE_________________________________  NURSE SIGNATURE__________________________________  ________________________________________________________________________

## 2014-08-10 NOTE — Pre-Procedure Instructions (Addendum)
08-10-14 Ekg done today-review by Dr. Landry Dyke per phone-will see preop AM of-reviewed pt history and notes in Epic. Labs-07-27-14 CBC/d,BMP; 08-06-14 H/H  Epic. CT Chest/abd/pelvis 08-03-14 Epic. CT neck soft tissue 07-27-14 Epic.

## 2014-08-10 NOTE — Progress Notes (Signed)
DENTAL CONSULTATION  Date of Consultation:  08/10/2014 Patient Name:   Alexander [name] Date of Birth:   November 21, 1950 Medical Record Number: [id number]  VITALS: BP 121/63 mmHg  Pulse 51  Temp(Src) 98 F (36.7 C) (Oral)  CHIEF COMPLAINT: Patient referred by Dr. Whitney Muse for a dental consultation.  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 Patient with anticipated chemoradiation therapy. Patient is now seen as part of a pre-chemoradiation there be dental protocol examination.  The patient currently denies acute toothaches, swellings, or abscesses. Patient has not seen a dentist for 10-15 years. Patient was last evaluated by a dentist for an "abscess". Patient was treated with antibiotics and then had a tooth pulled by a dentist in [place], [place]. The patient denies complications from that dental extraction. Patient does not seek regular dental care. Patient indicates that he is "afraid of the dentist".  Patient has no partial dentures. The patient is interested in having all remaining teeth extracted at this time.  PROBLEM LIST: Patient Active Problem List   Diagnosis Date Noted  . 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 Chronic periodontitis 08/10/2014  . Loose teeth 08/10/2014  . 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 Panendoscopy N/A 08/06/2014    Procedure: PANENDOSCOPY WITH BIOPSY;  Surgeon: Leta Baptist, MD;  Location: [facility];  Service: ENT;  Laterality: N/A;    ALLERGIES: No Known Allergies  MEDICATIONS: Current Outpatient Prescriptions  Medication Sig Dispense Refill  . oxyCODONE-acetaminophen (PERCOCET/ROXICET) 5-325 MG per tablet Take 2 tablets by mouth every 6 (six) hours as needed for severe pain. 100 tablet 0   No  current facility-administered medications for this visit.    LABS: Lab Results  Component Value Date   WBC 4.7 07/27/2014   HGB 14.9 08/06/2014   HCT 43.2 07/27/2014   MCV 96.2 07/27/2014   PLT 205 07/27/2014      Component Value Date/Time   NA 141 07/27/2014 1808   K 3.7 07/27/2014 1808   CL 103 07/27/2014 1808   CO2 31 07/27/2014 1808   GLUCOSE 88 07/27/2014 1808   BUN 8 07/27/2014 1808   CREATININE 0.96 07/27/2014 1808   CALCIUM 9.2 07/27/2014 1808   GFRNONAA >60 07/27/2014 1808   GFRAA >60 07/27/2014 1808   No results found for: INR, PROTIME No results found for: PTT  SOCIAL HISTORY: History   Social History  . Marital Status: Married    Spouse Name: N/A  . Number of Children: 5  . Years of Education: N/A   Occupational History  . Not on file.   Social History Main Topics  . Smoking status: Former Smoker -- 0.50 packs/day for 30 years    Quit date: 07/22/2014  . Smokeless tobacco: Never Used     Comment: Quit smoking a week ago  . Alcohol Use: No     Comment: 1-2 beers on the weekend  . Drug Use: No  . Sexual Activity: Not on file   Other Topics Concern  . Not on file   Social History Narrative    FAMILY HISTORY: History reviewed. No pertinent family history.  REVIEW OF SYSTEMS: Reviewed with the patient and is included in dental record.  DENTAL HISTORY:  CHIEF COMPLAINT: Patient referred by Dr.  Penland for a dental consultation.  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 Patient with anticipated chemoradiation therapy. Patient is now seen as part of a pre-chemoradiation there be dental protocol examination.  The patient currently denies acute toothaches, swellings, or abscesses. Patient has not seen a dentist for 10-15 years. Patient was last evaluated by a dentist for an "abscess". Patient was treated with antibiotics and then had a tooth pulled by a dentist in  [place], [place]. The patient denies complications from that dental extraction. Patient does not seek regular dental care. Patient indicates that he is "afraid of the dentist".  Patient has no partial dentures. The patient is interested in having all remaining teeth extracted at this time.  DENTAL EXAMINATION: GENERAL: The patient is a well-developed, slightly built male in no acute distress. HEAD AND NECK: Patient has significant left neck lymphadenopathy. I do not palpate any right neck lymphadenopathy. The patient denies acute TMJ symptoms. Maximum interincisal opening is measured at 50 mm. INTRAORAL EXAM: The patient has normal saliva. The left tonsil is consistent with cancer diagnosis. I do not see any evidence of oral abscess formation. DENTITION: Patient is missing tooth numbers 1 through 5, 7 through 16, 18 through 21, and 28 through 32. PERIODONTAL: The patient has chronic periodontitis with plaque and calculus accumulations, generalized gingival recession, and generalized tooth mobility as per dental charting form. DENTAL CARIES/SUBOPTIMAL RESTORATIONS: Patient has significant attrition associated with tooth #6. ENDODONTIC: Patient currently denies acute pulpitis symptoms. I do not see any evidence of periapical pathology. CROWN AND BRIDGE: There are no crown or bridge restorations. PROSTHODONTIC: Patient denies having partial dentures. OCCLUSION: Patient has a poor occlusal scheme secondary to multiple missing teeth, supra-eruption and drifting of the unopposed teeth into the edentulous areas, and lack of replacement missing teeth with dental prostheses.  RADIOGRAPHIC INTERPRETATION: An orthopantogram was taken and supplemented with 6 periapical radiographs  There are multiple missing teeth. There is moderate to severe bone loss. Radiographic calculus is noted. There is supra-eruption and drifting of the unopposed teeth into the edentulous areas. Malocclusion is noted.   Pneumatization of the maxillary sinuses are noted.  ASSESSMENTS: 1. 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 2. Pre-chemoradiation therapy dental protocol 3. Chronic periodontitis with bone loss 4. Gingival recession 5. Tooth mobility 6. Excessive attrition 7. Multiple missing teeth 8. Malocclusion 9. 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 Pneumatization of the maxillary sinuses  PLAN/RECOMMENDATIONS: 1. I discussed the risks, benefits, and complications of various treatment options with the patient in relationship to his medical and dental conditions, anticipated chemoradiation therapy, and chemoradiation therapy side effects to include xerostomia, radiation caries, trismus, mucositis, taste changes, gum and jawbone changes, and risk for infection, bleeding, and osteoradionecrosis. We discussed various treatment options to include no treatment, multiple extractions with alveoloplasty, pre-prosthetic surgery as indicated, periodontal therapy, dental restorations, root canal therapy, crown and bridge therapy, implant therapy, and replacement of missing teeth as indicated. The patient currently wishes to proceed with extraction of remaining teeth with alveoloplasty as needed in the operating room of general anesthesia. The operating procedure has been scheduled for this Wednesday, 08/12/2014 at 8:30 AM at [facility]. The patient will then follow-up with a dentist of his choice for fabrication of upper and lower complete dentures after adequate healing and approximately 3 months after the radiation therapy has been completed.    2. Discussion of findings with medical team and  coordination of future medical and dental care as needed.  I spent in excess of  120 minutes during the conduct of this consultation and >50% of this time involved direct face-to-face encounter for counseling and/or coordination of the patient's care.    Lenn Cal, DDS

## 2014-08-10 NOTE — Patient Instructions (Signed)

## 2014-08-11 ENCOUNTER — Encounter (HOSPITAL_COMMUNITY): Payer: Self-pay

## 2014-08-11 ENCOUNTER — Encounter (HOSPITAL_COMMUNITY)
Admission: RE | Admit: 2014-08-11 | Discharge: 2014-08-11 | Disposition: A | Discharge status: Home / Self Care | Payer: Medicaid Other | Source: Ambulatory Visit | Attending: General Surgery | Admitting: General Surgery

## 2014-08-11 DIAGNOSIS — Z01812 Encounter for preprocedural laboratory examination: Secondary | ICD-10-CM | POA: Diagnosis not present

## 2014-08-11 LAB — CBC
HCT: 45 % (ref 39.0–52.0)
HEMOGLOBIN: 15.1 g/dL (ref 13.0–17.0)
MCH: 31.5 pg (ref 26.0–34.0)
MCHC: 33.6 g/dL (ref 30.0–36.0)
MCV: 93.9 fL (ref 78.0–100.0)
Platelets: 236 10*3/uL (ref 150–400)
RBC: 4.79 MIL/uL (ref 4.22–5.81)
RDW: 13.4 % (ref 11.5–15.5)
WBC: 5.5 10*3/uL (ref 4.0–10.5)

## 2014-08-11 LAB — BASIC METABOLIC PANEL
Anion gap: 10 (ref 5–15)
BUN: 14 mg/dL (ref 6–20)
CALCIUM: 9.5 mg/dL (ref 8.9–10.3)
CO2: 28 mmol/L (ref 22–32)
CREATININE: 1 mg/dL (ref 0.61–1.24)
Chloride: 102 mmol/L (ref 101–111)
Glucose, Bld: 106 mg/dL — ABNORMAL HIGH (ref 65–99)
POTASSIUM: 4.4 mmol/L (ref 3.5–5.1)
Sodium: 140 mmol/L (ref 135–145)

## 2014-08-11 NOTE — H&P (Signed)
  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  Referred from oncology for PEG/portacath placement in order to start therapy.  Review of Symptoms:  Constitutional:unremarkable   Head:unremarkable Eyes:unremarkable   sore throat Cardiovascular:  unremarkable Respiratory:unremarkable Gastrointestinal:  unremarkable   Genitourinary:unremarkable   Musculoskeletal:unremarkable Skin:unremarkable swollen lymph nodes in neck Allergic/Immunologic:unremarkable   Past Medical History:  Reviewed  Past Medical History  Surgical History: laryngeal biopsy Medical Problems: oropharyngeal carcinoma Allergies: nkda Medications: oxycodone   Social History:Reviewed  Social History  Preferred Language: English Race:  Black or African American Ethnicity: Not Hispanic / Latino Age: [age]year Marital Status:  S Alcohol: no   Smoking Status: Former smoker reviewed on 08/11/2014 Started Date:  Stopped Date:  Functional Status reviewed on 08/11/2014 ------------------------------------------------ Bathing: Normal Cooking: Normal Dressing: Normal Driving: Normal Eating: Normal Managing Meds: Normal Oral Care: Normal Shopping: Normal Toileting: Normal Transferring: Normal Walking: Normal Cognitive Status reviewed on 08/11/2014 ------------------------------------------------ Attention: Normal Decision Making: Normal Language: Normal Memory: Normal Motor: Normal Perception: Normal Problem Solving: Normal Visual and Spatial: Normal   Family History:Reviewed  Family Health History Family History is Unknown    Objective Information: General:Well appearing, well nourished in no distress. extensive lymphadenopathy along left side  of neck Heart:RRR, no murmur Lungs:  CTA bilaterally, no wheezes, rhonchi, rales.  Breathing unlabored. Abdomen:Soft, NT/ND, no HSM, no masses.  Assessment:Oropharyngeal carcinoma  Diagnoses: 146.9  C10.9 Malignant tumor of oropharynx (Malignant neoplasm of oropharynx, unspecified)  Procedures: 992924- OFFICE OUTPATIENT NEW 30 MINUTES    Plan:  Scheduled for PEG, portacath placement on 08/17/14.   Patient Education:Alternative treatments to surgery were discussed with patient (and family).  Risks and benefits  of procedure including bleeding, infection, pneumothorax, and bowel injury were fully explained to the patient (and family) who gave informed consent. Patient/family questions were addressed.  Follow-up:Pending Surgery

## 2014-08-11 NOTE — Pre-Procedure Instructions (Signed)
Advised Dr Patsey Berthold of patient's complicated dx of oropharyngeal cancer with carcinoma to left base of tongue.  Per Dr [name], this patient is scheduled to have procedure under MAC anesthesia, therefore Dr. Patsey Berthold agreed to proceed as planned.

## 2014-08-11 NOTE — Patient Instructions (Signed)
Alexander [name]  08/11/2014   Your procedure is scheduled on:  08/17/2014  Report to [facility] at 8:25 AM.  Call this number if you have problems the morning of surgery: [phone]   Remember:   Do not eat food or drink liquids after midnight.   Take these medicines the morning of surgery with A SIP OF WATER: Oxycodone   Do not wear jewelry, make-up or nail polish.  Do not wear lotions, powders, or perfumes. You may wear deodorant.  Do not shave 48 hours prior to surgery. Men may shave face and neck.  Do not bring valuables to the hospital.  [facility] is not responsible for any belongings or valuables.               Contacts, dentures or bridgework may not be worn into surgery.  Leave suitcase in the car. After surgery it may be brought to your room.  For patients admitted to the hospital, discharge time is determined by your treatment team.               Patients discharged the day of surgery will not be allowed to drive home.  Name and phone number of your driver:   Special Instructions: Shower using CHG 2 nights before surgery and the night before surgery.  If you shower the day of surgery use CHG.  Use special wash - you have one bottle of CHG for all showers.  You should use approximately 1/3 of the bottle for each shower.   Please read over the following fact sheets that you were given: Surgical Site Infection Prevention and Anesthesia Post-op Instructions   PATIENT INSTRUCTIONS POST-ANESTHESIA  IMMEDIATELY FOLLOWING SURGERY:  Do not drive or operate machinery for the first twenty four hours after surgery.  Do not make any important decisions for twenty four hours after surgery or while taking narcotic pain medications or sedatives.  If you develop intractable nausea and vomiting or a severe headache please notify your doctor immediately.  FOLLOW-UP:  Please make an appointment with your surgeon as instructed. You do not need to follow up with anesthesia unless specifically  instructed to do so.  WOUND CARE INSTRUCTIONS (if applicable):  Keep a dry clean dressing on the anesthesia/puncture wound site if there is drainage.  Once the wound has quit draining you may leave it open to air.  Generally you should leave the bandage intact for twenty four hours unless there is drainage.  If the epidural site drains for more than 36-48 hours please call the anesthesia department.  QUESTIONS?:  Please feel free to call your physician or the hospital operator if you have any questions, and they will be happy to assist you.      Implanted [facility] Guide An implanted port is a type of central line that is placed under the skin. Central lines are used to provide IV access when treatment or nutrition needs to be given through a person's veins. Implanted ports are used for long-term IV access. An implanted port may be placed because:   You need IV medicine that would be irritating to the small veins in your hands or arms.   You need long-term IV medicines, such as antibiotics.   You need IV nutrition for a long period.   You need frequent blood draws for lab tests.   You need dialysis.  Implanted ports are usually placed in the chest area, but they can also be placed in the upper arm, the  abdomen, or the leg. An implanted port has two main parts:   Reservoir. The reservoir is round and will appear as a small, raised area under your skin. The reservoir is the part where a needle is inserted to give medicines or draw blood.   Catheter. The catheter is a thin, flexible tube that extends from the reservoir. The catheter is placed into a large vein. Medicine that is inserted into the reservoir goes into the catheter and then into the vein.  HOW WILL I CARE FOR MY INCISION SITE? Do not get the incision site wet. Bathe or shower as directed by your health care provider.  HOW IS MY PORT ACCESSED? Special steps must be taken to access the port:   Before the port is accessed,  a numbing cream can be placed on the skin. This helps numb the skin over the port site.   Your health care provider uses a sterile technique to access the port.  Your health care provider must put on a mask and sterile gloves.  The skin over your port is cleaned carefully with an antiseptic and allowed to dry.  The port is gently pinched between sterile gloves, and a needle is inserted into the port.  Only "non-coring" port needles should be used to access the port. Once the port is accessed, a blood return should be checked. This helps ensure that the port is in the vein and is not clogged.   If your port needs to remain accessed for a constant infusion, a clear (transparent) bandage will be placed over the needle site. The bandage and needle will need to be changed every week, or as directed by your health care provider.   Keep the bandage covering the needle clean and dry. Do not get it wet. Follow your health care provider's instructions on how to take a shower or bath while the port is accessed.   If your port does not need to stay accessed, no bandage is needed over the port.  WHAT IS FLUSHING? Flushing helps keep the port from getting clogged. Follow your health care provider's instructions on how and when to flush the port. Ports are usually flushed with saline solution or a medicine called heparin. The need for flushing will depend on how the port is used.   If the port is used for intermittent medicines or blood draws, the port will need to be flushed:   After medicines have been given.   After blood has been drawn.   As part of routine maintenance.   If a constant infusion is running, the port may not need to be flushed.  HOW LONG WILL MY PORT STAY IMPLANTED? The port can stay in for as long as your health care provider thinks it is needed. When it is time for the port to come out, surgery will be done to remove it. The procedure is similar to the one performed when  the port was put in.  WHEN SHOULD I SEEK IMMEDIATE MEDICAL CARE? When you have an implanted port, you should seek immediate medical care if:   You notice a bad smell coming from the incision site.   You have swelling, redness, or drainage at the incision site.   You have more swelling or pain at the port site or the surrounding area.   You have a fever that is not controlled with medicine. Document Released: 03/13/2005 Document Revised: 01/01/2013 Document Reviewed: 11/18/2012 [facility] Patient Information 2015 [place], [place]. This information is not  intended to replace advice given to you by your health care provider. Make sure you discuss any questions you have with your health care provider. PEG, Home Care You had a percutaneous endoscopic gastrostomy (PEG) tube put in your stomach. Do not put anything in your feeding tube until you have been shown how to use it. HOME CARE For the first 24 hours:  Do not sign any important or legal papers.  Do not drive. Every day:  Check the place where the tube goes into your belly. See your doctor if the tube site is red, puffy (swollen), or smells bad.  Clean around the tube with soap and water. Dry the area by patting it gently with a clean towel.  Do not put a bandage around the tube site. Keep the area dry.  You should turn the bumper on the outside of your belly. The bumper should lie flat against your skin so you are comfortable.  Do not put whole pills through the tube. Instead, crush and dissolve all pills in warm water before placing down tube. GET HELP RIGHT AWAY IF:  You throw up (vomit).  The pain in your belly gets worse.  You have trouble breathing.  You have a temperature by mouth above 102 F (38.9 C), not controlled by medicine.  You have pain when you swallow.  You have chest pain.  There is green or yellow fluid (drainage) around the tube site that smells bad.  There is redness and puffiness around the tube  site.  The tube comes out. MAKE SURE YOU:  Understand these instructions.  Will watch your condition.  Will get help right away if you are not doing well or get worse. Document Released: 04/15/2010 Document Revised: 11/13/2012 Document Reviewed: 09/12/2012 [facility] Patient Information 2015 [place], [place]. This information is not intended to replace advice given to you by your health care provider. Make sure you discuss any questions you have with your health care provider.

## 2014-08-12 ENCOUNTER — Ambulatory Visit (HOSPITAL_COMMUNITY): Payer: Medicaid Other | Admitting: Anesthesiology

## 2014-08-12 ENCOUNTER — Ambulatory Visit (HOSPITAL_COMMUNITY)
Admission: RE | Admit: 2014-08-12 | Discharge: 2014-08-12 | Disposition: A | Discharge status: Home / Self Care | Payer: Medicaid Other | Source: Ambulatory Visit | Attending: Dentistry | Admitting: Dentistry

## 2014-08-12 ENCOUNTER — Encounter (HOSPITAL_COMMUNITY)
Admission: RE | Disposition: A | Discharge status: Home / Self Care | Payer: Self-pay | Source: Ambulatory Visit | Attending: Dentistry

## 2014-08-12 ENCOUNTER — Encounter (HOSPITAL_COMMUNITY): Payer: Self-pay | Admitting: *Deleted

## 2014-08-12 DIAGNOSIS — K0530 Chronic periodontitis, unspecified: Secondary | ICD-10-CM

## 2014-08-12 DIAGNOSIS — K088 Other specified disorders of teeth and supporting structures: Secondary | ICD-10-CM

## 2014-08-12 DIAGNOSIS — Z87891 Personal history of nicotine dependence: Secondary | ICD-10-CM | POA: Insufficient documentation

## 2014-08-12 DIAGNOSIS — C01 Malignant neoplasm of base of tongue: Secondary | ICD-10-CM | POA: Diagnosis present

## 2014-08-12 HISTORY — PX: MULTIPLE EXTRACTIONS WITH ALVEOLOPLASTY: SHX5342

## 2014-08-12 SURGERY — MULTIPLE EXTRACTION WITH ALVEOLOPLASTY
Anesthesia: General | Site: Mouth

## 2014-08-12 MED ORDER — LACTATED RINGERS IV SOLN
INTRAVENOUS | Status: DC | PRN
  Administered 2014-08-12 (×2): via INTRAVENOUS

## 2014-08-12 MED ORDER — SUCCINYLCHOLINE CHLORIDE 20 MG/ML IJ SOLN
INTRAMUSCULAR | Status: DC | PRN
  Administered 2014-08-12: 100 mg via INTRAVENOUS

## 2014-08-12 MED ORDER — LIDOCAINE-EPINEPHRINE 2 %-1:100000 IJ SOLN
INTRAMUSCULAR | Status: DC | PRN
  Administered 2014-08-12: 1.7 mL via INTRADERMAL

## 2014-08-12 MED ORDER — OXYCODONE-ACETAMINOPHEN 5-325 MG PO TABS: ORAL_TABLET | ORAL | Status: DC

## 2014-08-12 MED ORDER — LACTATED RINGERS IV SOLN: INTRAVENOUS | Status: DC

## 2014-08-12 MED ORDER — GLYCOPYRROLATE 0.2 MG/ML IJ SOLN
INTRAMUSCULAR | Status: AC
  Filled 2014-08-12: qty 3

## 2014-08-12 MED ORDER — ROCURONIUM BROMIDE 100 MG/10ML IV SOLN
INTRAVENOUS | Status: AC
Start: 2014-08-12 — End: 2014-08-12
  Filled 2014-08-12: qty 1

## 2014-08-12 MED ORDER — BUPIVACAINE-EPINEPHRINE (PF) 0.5% -1:200000 IJ SOLN
INTRAMUSCULAR | Status: AC
Start: 2014-08-12 — End: 2014-08-12
  Filled 2014-08-12: qty 3.6

## 2014-08-12 MED ORDER — 0.9 % SODIUM CHLORIDE (POUR BTL) OPTIME
TOPICAL | Status: DC | PRN
  Administered 2014-08-12: 1000 mL

## 2014-08-12 MED ORDER — MIDAZOLAM HCL 5 MG/5ML IJ SOLN
INTRAMUSCULAR | Status: DC | PRN
  Administered 2014-08-12: 2 mg via INTRAVENOUS

## 2014-08-12 MED ORDER — LIDOCAINE HCL (CARDIAC) 20 MG/ML IV SOLN
INTRAVENOUS | Status: AC
  Filled 2014-08-12: qty 5

## 2014-08-12 MED ORDER — ONDANSETRON HCL 4 MG/2ML IJ SOLN
INTRAMUSCULAR | Status: DC | PRN
  Administered 2014-08-12: 4 mg via INTRAVENOUS

## 2014-08-12 MED ORDER — GLYCOPYRROLATE 0.2 MG/ML IJ SOLN
INTRAMUSCULAR | Status: DC | PRN
  Administered 2014-08-12: 0.6 mg via INTRAVENOUS

## 2014-08-12 MED ORDER — OXYMETAZOLINE HCL 0.05 % NA SOLN
NASAL | Status: AC
  Filled 2014-08-12: qty 15

## 2014-08-12 MED ORDER — HYDROMORPHONE HCL 1 MG/ML IJ SOLN: 0.2500 mg | INTRAMUSCULAR | Status: DC | PRN

## 2014-08-12 MED ORDER — LIDOCAINE-EPINEPHRINE 2 %-1:100000 IJ SOLN
INTRAMUSCULAR | Status: AC
  Filled 2014-08-12: qty 10.2

## 2014-08-12 MED ORDER — LIDOCAINE HCL (CARDIAC) 20 MG/ML IV SOLN
INTRAVENOUS | Status: DC | PRN
  Administered 2014-08-12: 50 mg via INTRAVENOUS

## 2014-08-12 MED ORDER — FENTANYL CITRATE (PF) 100 MCG/2ML IJ SOLN
INTRAMUSCULAR | Status: DC | PRN
  Administered 2014-08-12: 50 ug via INTRAVENOUS
  Administered 2014-08-12: 100 ug via INTRAVENOUS
  Administered 2014-08-12 (×2): 50 ug via INTRAVENOUS

## 2014-08-12 MED ORDER — CEFAZOLIN SODIUM-DEXTROSE 2-3 GM-% IV SOLR
INTRAVENOUS | Status: AC
  Filled 2014-08-12: qty 50

## 2014-08-12 MED ORDER — ROCURONIUM BROMIDE 100 MG/10ML IV SOLN
INTRAVENOUS | Status: DC | PRN
  Administered 2014-08-12: 30 mg via INTRAVENOUS

## 2014-08-12 MED ORDER — PROPOFOL 10 MG/ML IV BOLUS
INTRAVENOUS | Status: AC
  Filled 2014-08-12: qty 20

## 2014-08-12 MED ORDER — OXYCODONE-ACETAMINOPHEN 5-325 MG PO TABS: 1.0000 | ORAL_TABLET | ORAL | Status: DC | PRN

## 2014-08-12 MED ORDER — NEOSTIGMINE METHYLSULFATE 10 MG/10ML IV SOLN
INTRAVENOUS | Status: AC
Start: 2014-08-12 — End: 2014-08-12
  Filled 2014-08-12: qty 1

## 2014-08-12 MED ORDER — ISOPROPYL ALCOHOL 70 % SOLN
Status: DC | PRN
  Administered 2014-08-12: 1 via TOPICAL

## 2014-08-12 MED ORDER — MEPERIDINE HCL 50 MG/ML IJ SOLN: 6.2500 mg | INTRAMUSCULAR | Status: DC | PRN

## 2014-08-12 MED ORDER — DEXAMETHASONE SODIUM PHOSPHATE 10 MG/ML IJ SOLN
INTRAMUSCULAR | Status: AC
  Filled 2014-08-12: qty 1

## 2014-08-12 MED ORDER — ISOPROPYL ALCOHOL 70 % SOLN
Status: AC
  Filled 2014-08-12: qty 480

## 2014-08-12 MED ORDER — CEFAZOLIN SODIUM-DEXTROSE 2-3 GM-% IV SOLR
2.0000 g | Freq: Once | INTRAVENOUS | Status: AC
  Administered 2014-08-12: 2 g via INTRAVENOUS

## 2014-08-12 MED ORDER — ONDANSETRON HCL 4 MG/2ML IJ SOLN
INTRAMUSCULAR | Status: AC
  Filled 2014-08-12: qty 2

## 2014-08-12 MED ORDER — PROPOFOL 10 MG/ML IV BOLUS
INTRAVENOUS | Status: DC | PRN
  Administered 2014-08-12: 180 mg via INTRAVENOUS

## 2014-08-12 MED ORDER — MIDAZOLAM HCL 2 MG/2ML IJ SOLN
INTRAMUSCULAR | Status: AC
Start: 2014-08-12 — End: 2014-08-12
  Filled 2014-08-12: qty 2

## 2014-08-12 MED ORDER — NEOSTIGMINE METHYLSULFATE 10 MG/10ML IV SOLN
INTRAVENOUS | Status: DC | PRN
  Administered 2014-08-12: 4 mg via INTRAVENOUS

## 2014-08-12 MED ORDER — PROMETHAZINE HCL 25 MG/ML IJ SOLN: 6.2500 mg | INTRAMUSCULAR | Status: DC | PRN

## 2014-08-12 MED ORDER — DEXAMETHASONE SODIUM PHOSPHATE 10 MG/ML IJ SOLN
INTRAMUSCULAR | Status: DC | PRN
  Administered 2014-08-12: 10 mg via INTRAVENOUS

## 2014-08-12 MED ORDER — FENTANYL CITRATE (PF) 250 MCG/5ML IJ SOLN
INTRAMUSCULAR | Status: AC
Start: 2014-08-12 — End: 2014-08-12
  Filled 2014-08-12: qty 5

## 2014-08-12 SURGICAL SUPPLY — 27 items
ATTRACTOMAT 16X20 MAGNETIC DRP (DRAPES) ×3 IMPLANT
BAG ZIPLOCK 12X15 (MISCELLANEOUS) IMPLANT
BANDAGE EYE OVAL (MISCELLANEOUS) ×6 IMPLANT
BLADE SURG 15 STRL LF DISP TIS (BLADE) ×2 IMPLANT
BLADE SURG 15 STRL SS (BLADE) ×4
CANNULA VESSEL W/WING WO/VALVE (CANNULA) ×3 IMPLANT
GAUZE SPONGE 4X4 12PLY STRL (GAUZE/BANDAGES/DRESSINGS) ×3 IMPLANT
GAUZE SPONGE 4X4 16PLY XRAY LF (GAUZE/BANDAGES/DRESSINGS) ×3 IMPLANT
GLOVE BIOGEL PI IND STRL 6 (GLOVE) ×1 IMPLANT
GLOVE BIOGEL PI INDICATOR 6 (GLOVE) ×2
GLOVE SURG ORTHO 8.0 STRL STRW (GLOVE) ×3 IMPLANT
GLOVE SURG SS PI 6.0 STRL IVOR (GLOVE) ×3 IMPLANT
GOWN STRL REUS W/TWL 2XL LVL3 (GOWN DISPOSABLE) ×3 IMPLANT
GOWN STRL REUS W/TWL LRG LVL3 (GOWN DISPOSABLE) ×3 IMPLANT
KIT BASIN OR (CUSTOM PROCEDURE TRAY) ×3 IMPLANT
NS IRRIG 1000ML POUR BTL (IV SOLUTION) ×3 IMPLANT
PACK EENT SPLIT (PACKS) ×3 IMPLANT
PACKING VAGINAL (PACKING) ×3 IMPLANT
SUCTION FRAZIER 12FR DISP (SUCTIONS) IMPLANT
SUT CHROMIC 3 0 PS 2 (SUTURE) ×9 IMPLANT
SUT CHROMIC 4 0 P 3 18 (SUTURE) IMPLANT
SYR 50ML LL SCALE MARK (SYRINGE) ×3 IMPLANT
TOWEL OR 17X26 10 PK STRL BLUE (TOWEL DISPOSABLE) ×3 IMPLANT
TUBING CONNECTING 10 (TUBING) ×2 IMPLANT
TUBING CONNECTING 10' (TUBING) ×1
WATER STERILE IRR 1500ML POUR (IV SOLUTION) ×3 IMPLANT
YANKAUER SUCT BULB TIP NO VENT (SUCTIONS) ×3 IMPLANT

## 2014-08-12 NOTE — Anesthesia Procedure Notes (Signed)
Procedure Name: Intubation Date/Time: 08/12/2014 8:40 AM Performed by: Noralyn Pick D Pre-anesthesia Checklist: Patient identified, Emergency Drugs available, Suction available and Patient being monitored Patient Re-evaluated:Patient Re-evaluated prior to inductionOxygen Delivery Method: Circle System Utilized Preoxygenation: Pre-oxygenation with 100% oxygen Intubation Type: IV induction Ventilation: Mask ventilation without difficulty Laryngoscope Size: Glidescope and 4 Grade View: Grade II Tube type: Oral Tube size: 7.5 mm Number of attempts: 1 Airway Equipment and Method: Stylet and Oral airway Placement Confirmation: ETT inserted through vocal cords under direct vision,  positive ETCO2 and breath sounds checked- equal and bilateral Secured at: 22 cm Tube secured with: Tape Dental Injury: Teeth and Oropharynx as per pre-operative assessment

## 2014-08-12 NOTE — Anesthesia Preprocedure Evaluation (Signed)
Anesthesia Evaluation  Patient identified by MRN, date of birth, ID band Patient awake    Reviewed: Allergy & Precautions, NPO status , Patient's Chart, lab work & pertinent test results  Airway Mallampati: II  TM Distance: >3 FB Neck ROM: Full    Dental   Pulmonary former smoker,  breath sounds clear to auscultation        Cardiovascular negative cardio ROS  Rhythm:Regular Rate:Normal     Neuro/Psych    GI/Hepatic negative GI ROS, Neg liver ROS,   Endo/Other  negative endocrine ROS  Renal/GU negative Renal ROS     Musculoskeletal   Abdominal   Peds  Hematology   Anesthesia Other Findings   Reproductive/Obstetrics                             Anesthesia Physical  Anesthesia Plan  ASA: II  Anesthesia Plan: General   Post-op Pain Management:    Induction: Intravenous  Airway Management Planned: Nasal ETT  Additional Equipment:   Intra-op Plan:   Post-operative Plan: Extubation in OR  Informed Consent: I have reviewed the patients History and Physical, chart, labs and discussed the procedure including the risks, benefits and alternatives for the proposed anesthesia with the patient or authorized representative who has indicated his/her understanding and acceptance.   Dental advisory given  Plan Discussed with: CRNA and Anesthesiologist  Anesthesia Plan Comments:         Anesthesia Quick Evaluation

## 2014-08-12 NOTE — Op Note (Signed)
OPERATIVE REPORT  Patient:            Alexander [name] Date of Birth:  February 02, 1951 MRN:                [id number]   DATE OF PROCEDURE:  08/12/2014  PREOPERATIVE DIAGNOSES: 1. 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 Pre-chemoradiation therapy dental protocol 3. Chronic periodontitis 4. Tooth mobility  POSTOPERATIVE DIAGNOSES: 1. 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 Pre-chemoradiation therapy dental protocol 3. Chronic periodontitis 4. Tooth mobility  OPERATIONS: 1. Multiple extraction of tooth numbers 6, 17, 22, 23, 24, 25, 26, and 27. 2. 3 Quadrants of alveoloplasty   SURGEON: Lenn Cal, DDS  ASSISTANT: Camie Patience, (dental assistant)  ANESTHESIA: General anesthesia via oral endotracheal tube.  MEDICATIONS: 1. Ancef 2 g IV prior to invasive dental procedures. 2. Local anesthesia with a total utilization of 4 carpules each containing 34 mg of lidocaine with 0.017 mg of epinephrine as well as 2 carpules each containing 9 mg of bupivacaine with 0.009 mg of epinephrine.  SPECIMENS: There are 8 teeth that were discarded.  DRAINS: None  CULTURES: None  COMPLICATIONS: None   ESTIMATED BLOOD LOSS: 50 mLs.  INTRAVENOUS FLUIDS: 1300 mLs of Lactated ringers solution.  INDICATIONS: The patient was recently diagnosed with squamous cell carcinoma of the left base of tongue.  A dental consultation was then requested as part of a medically necessary pre-chemoradiation therapy dental protocol examination. The patient was examined and treatment planned for extraction of remaining teeth with alveoloplasty in the operating room with general anesthesia.  This treatment plan was formulated to decrease the risks and complications associated with dental infection from affecting the patient's systemic health and to prevent future complications such as osteoradionecrosis.  OPERATIVE FINDINGS: Patient was examined operating room number 11.  The teeth  were identified for extraction. The patient was noted be affected by chronic periodontitis and tooth mobility.   DESCRIPTION OF PROCEDURE: Patient was brought to the main operating room number 11. Patient was then placed in the supine position on the operating table. General anesthesia was then induced per the anesthesia team. The patient was then prepped and draped in the usual manner for dental medicine procedure. A timeout was performed. The patient was identified and procedures were verified. A throat pack was placed at this time. The oral cavity was then thoroughly examined with the findings noted above. The patient was then ready for dental medicine procedure as follows:  Local anesthesia was then administered sequentially with a total utilization of 4 carpules each containing 34 mg of lidocaine with 0.017 mg of epinephrine as well as 2 carpules  each containing 9 mg bupivacaine with 0.009 mg of epinephrine.  The Maxillary right quadrant was first approached. Anesthesia was then delivered utilizing infiltration with lidocaine with epinephrine. A #15 blade incision was then made from the distal of #4 and extended to the mesial of #8.  A  surgical flap was then carefully reflected. Tooth number 6 was then removed with a 150 forceps without complications. Alveoloplasty was then performed utilizing a ronguers and bone file. The surgical site was then irrigated with copious amounts of sterile saline. The tissues were approximated and trimmed appropriately. The surgical site was then closed from the distal of #4 and extended the mesial #8 utilizing 3-0 chromic gut material in a continuous interrupted suture technique 1.  At this point time, the mandibular quadrants were approached. The patient was  given bilateral inferior alveolar nerve blocks and long buccal nerve blocks utilizing the bupivacaine with epinephrine. Further infiltration was then achieved utilizing the lidocaine with epinephrine. A 15 blade  incision was then made from the distal of number 17 and extended to the distal of #19.  A second 15 blade incision was then made from the distal of #21 and extended to the distal of #29.  A surgical flap was then carefully reflected. The lower teeth were then subluxated with a series straight elevators. Tooth #17 was then removed with a 151 forceps without complication. Tooth numbers 22, 23, 24, 25, 26, 27 were then removed with a 15 1 forceps without complications. Alveoloplasty was then performed utilizing a rongeurs and bone file. The tissues were approximated and trimmed appropriately. The surgical sites were then irrigated with copious amounts of sterile saline. The mandibular left surgical site was then closed from the distal of  17 and extended to the distal of #19 utilizing 3-0 chromic gut suture in a continuous interrupted suture technique 1. The mandibular left surgical site was then further closed from the distal of #21 and extended to the mesial #24 utilizing 3-0 chromic gut suture in a continuous interrupted suture technique 1. The modular right surgical site was then closed from the distal of #29 extended the mesial #25 utilizing 3-0 chromic gut suture in a continuous interrupted suture technique 1. 2 individual interrupted sutures were then placed to further closed surgical site as needed.   At this point time, the entire mouth was irrigated with copious amounts of sterile saline. The patient was examined for complications, seeing none, the dental medicine procedure was deemed to be complete. The throat pack was removed at this time. An oral airway was then placed at the request of the anesthesia team. A series of 4 x 4 gauze were placed in the mouth to aid hemostasis. The patient was then handed over to the anesthesia team for final disposition. After an appropriate amount of time, the patient was extubated and taken to the postanesthsia care unit in good condition.  All counts were correct for  the dental medicine procedure. The patient is to return dental medicine in 7-10 days for evaluation for suture removal.    Lenn Cal, DDS.

## 2014-08-12 NOTE — H&P (Signed)
08/12/2014  Patient:            Alexander [name] Date of Birth:  October 31, 1950 MRN:                [id number]   BP 132/67 mmHg  Pulse 54  Temp(Src) 98 F (36.7 C) (Oral)  Resp 18  Ht 6' 1.5" (1.867 m)  Wt 155 lb 6 oz (70.478 kg)  BMI 20.22 kg/m2  SpO2 99%   Alexander [name] is a 64 year old male that presents for dental procedure in the OR with GA. Patient denies acute medical or dental problems. Please use H and P from Dr. Aviva Signs dated 08/11/14 for use as H and P for dental OR procedure.  Alexander [name], DDS  Author: Aviva Signs Md, MD Service: Surgery Author Type: Physician    Filed: 08/11/2014 10:31 AM Note Time: 08/11/2014 10:31 AM Status: Signed   Editor: Aviva Signs Md, MD (Physician)     Expand All Collapse All    NTS SOAP Note  Vital Signs:  Vitals as of: 3/54/5625: Systolic 638: Diastolic 65: Heart Rate 64: Temp 31F: Height 28f 1.5in: Weight 154Lbs 0 Ounces: Pain Level 4: BMI 20.04  BMI : 20.04 kg/m2  Subjective: This 64year old male presents for of oropharyngeal carcinoma. Referred from oncology for PEG/portacath placement in order to start therapy.  Review of Symptoms:  Constitutional:unremarkable  Head:unremarkable Eyes:unremarkable  sore throat Cardiovascular: unremarkable Respiratory:unremarkable Gastrointestinal: unremarkable  Genitourinary:unremarkable  Musculoskeletal:unremarkable Skin:unremarkable swollen lymph nodes in neck Allergic/Immunologic:unremarkable   Past Medical History: Reviewed  Past Medical History  Surgical History: laryngeal biopsy Medical Problems: oropharyngeal carcinoma Allergies: nkda Medications: oxycodone   Social History:Reviewed  Social History  Preferred Language: English Race: Black or African American Ethnicity: Not Hispanic / Latino Age: [age]year Marital Status: S Alcohol: no   Smoking Status: Former smoker reviewed on 08/11/2014 Started Date:  Stopped  Date:  Functional Status reviewed on 08/11/2014 ------------------------------------------------ Bathing: Normal Cooking: Normal Dressing: Normal Driving: Normal Eating: Normal Managing Meds: Normal Oral Care: Normal Shopping: Normal Toileting: Normal Transferring: Normal Walking: Normal Cognitive Status reviewed on 08/11/2014 ------------------------------------------------ Attention: Normal Decision Making: Normal Language: Normal Memory: Normal Motor: Normal Perception: Normal Problem Solving: Normal Visual and Spatial: Normal   Family History:Reviewed  Family Health History Family History is Unknown    Objective Information: General:Well appearing, well nourished in no distress. extensive lymphadenopathy along left side of neck Heart:RRR, no murmur Lungs: CTA bilaterally, no wheezes, rhonchi, rales. Breathing unlabored. Abdomen:Soft, NT/ND, no HSM, no masses.  Assessment:Oropharyngeal carcinoma  Diagnoses: 146.9  C10.9 Malignant tumor of oropharynx (Malignant neoplasm of oropharynx, unspecified)  Procedures: 993734- OFFICE OUTPATIENT NEW 30 MINUTES    Plan: Scheduled for PEG, portacath placement on 08/17/14.   Patient Education:Alternative treatments to surgery were discussed with patient (and family). Risks and benefits of procedure including bleeding, infection, pneumothorax, and bowel injury were fully explained to the patient (and family) who gave informed consent. Patient/family questions were addressed.  Follow-up:Pending Surgery

## 2014-08-12 NOTE — Discharge Instructions (Signed)

## 2014-08-12 NOTE — Anesthesia Postprocedure Evaluation (Signed)
Anesthesia Post Note  Patient: Alexander [name]  Procedure(s) Performed: Procedure(s) (LRB): Extraction of tooth #'s 6,17,22,23,24,25,26,27 with alveoloplasty (N/A)  Anesthesia type: General  Patient location: PACU  Post pain: Pain level controlled  Post assessment: Post-op Vital signs reviewed  Last Vitals: BP 150/74 mmHg  Pulse 52  Temp(Src) 36.3 C (Oral)  Resp 19  Ht 6' 1.5" (1.867 m)  Wt 155 lb 6 oz (70.478 kg)  BMI 20.22 kg/m2  SpO2 100%  Post vital signs: Reviewed  Level of consciousness: sedated  Complications: No apparent anesthesia complications

## 2014-08-12 NOTE — Progress Notes (Signed)
PRE-OPERATIVE NOTE:  08/12/2014   Alexander [name] [id number]  VITALS: BP 132/67 mmHg  Pulse 54  Temp(Src) 98 F (36.7 C) (Oral)  Resp 18  Ht 6' 1.5" (1.867 m)  Wt 155 lb 6 oz (70.478 kg)  BMI 20.22 kg/m2  SpO2 99%  Lab Results  Component Value Date   WBC 5.5 08/11/2014   HGB 15.1 08/11/2014   HCT 45.0 08/11/2014   MCV 93.9 08/11/2014   PLT 236 08/11/2014   BMET    Component Value Date/Time   NA 140 08/11/2014 1400   K 4.4 08/11/2014 1400   CL 102 08/11/2014 1400   CO2 28 08/11/2014 1400   GLUCOSE 106* 08/11/2014 1400   BUN 14 08/11/2014 1400   CREATININE 1.00 08/11/2014 1400   CALCIUM 9.5 08/11/2014 1400   GFRNONAA >60 08/11/2014 1400   GFRAA >60 08/11/2014 1400    No results found for: INR, PROTIME No results found for: PTT   Alexander [name] presents for multiple dental extractions with alveoloplasty in the operating room.   SUBJECTIVE: The patient denies any acute medical or dental changes and agrees to proceed with treatment as planned.  EXAM: No sign of acute dental changes.  ASSESSMENT: Patient is affected by chronic periodontitis, tooth mobility, and excessive attrition.  PLAN: Patient agrees to proceed with treatment as planned in the operating room as previously discussed and accepts the risks, benefits, and complications of the proposed treatment. Patient is aware of the risk for bleeding, bruising, swelling, infection, pain, nerve damage, soft tissue damage,  sinus involvement, root tip fracture, mandible fracture, and the risks of complications associated with the anesthesia including airway compromise. Patient also is aware of the potential for other complications not mentioned above.   Lenn Cal, DDS

## 2014-08-12 NOTE — Transfer of Care (Signed)
Immediate Anesthesia Transfer of Care Note  Patient: Alexander [name]  Procedure(s) Performed: Procedure(s): Extraction of tooth #'s 6,17,22,23,24,25,26,27 with alveoloplasty (N/A)  Patient Location: PACU  Anesthesia Type:General  Level of Consciousness: awake, alert  and oriented  Airway & Oxygen Therapy: Patient Spontanous Breathing and Patient connected to face mask oxygen  Post-op Assessment: Report given to RN and Post -op Vital signs reviewed and stable  Post vital signs: Reviewed and stable  Last Vitals:  Filed Vitals:   08/12/14 0643  BP: 132/67  Pulse: 54  Temp: 36.7 C  Resp: 18    Complications: No apparent anesthesia complications

## 2014-08-13 ENCOUNTER — Ambulatory Visit (INDEPENDENT_AMBULATORY_CARE_PROVIDER_SITE_OTHER): Payer: Self-pay | Admitting: Otolaryngology

## 2014-08-13 ENCOUNTER — Encounter (HOSPITAL_COMMUNITY): Payer: Self-pay | Admitting: Dentistry

## 2014-08-17 ENCOUNTER — Encounter (HOSPITAL_COMMUNITY): Payer: Self-pay | Admitting: Oncology

## 2014-08-17 ENCOUNTER — Ambulatory Visit (HOSPITAL_COMMUNITY): Payer: Medicaid Other | Admitting: Anesthesiology

## 2014-08-17 ENCOUNTER — Encounter (HOSPITAL_COMMUNITY): Payer: Self-pay | Admitting: *Deleted

## 2014-08-17 ENCOUNTER — Ambulatory Visit (HOSPITAL_COMMUNITY): Payer: Medicaid Other

## 2014-08-17 ENCOUNTER — Encounter (HOSPITAL_COMMUNITY)
Admission: RE | Disposition: A | Discharge status: Home / Self Care | Payer: Self-pay | Source: Ambulatory Visit | Attending: General Surgery

## 2014-08-17 ENCOUNTER — Encounter (HOSPITAL_BASED_OUTPATIENT_CLINIC_OR_DEPARTMENT_OTHER): Payer: Medicaid Other | Admitting: Oncology

## 2014-08-17 ENCOUNTER — Ambulatory Visit (HOSPITAL_COMMUNITY)
Admission: RE | Admit: 2014-08-17 | Discharge: 2014-08-17 | Disposition: A | Discharge status: Home / Self Care | Payer: Medicaid Other | Source: Ambulatory Visit | Attending: General Surgery | Admitting: General Surgery

## 2014-08-17 VITALS — BP 136/64 | HR 56 | Temp 98.2°F | Resp 16 | Wt 157.5 lb

## 2014-08-17 DIAGNOSIS — Z87891 Personal history of nicotine dependence: Secondary | ICD-10-CM | POA: Diagnosis not present

## 2014-08-17 DIAGNOSIS — C109 Malignant neoplasm of oropharynx, unspecified: Secondary | ICD-10-CM | POA: Insufficient documentation

## 2014-08-17 DIAGNOSIS — Z95828 Presence of other vascular implants and grafts: Secondary | ICD-10-CM

## 2014-08-17 HISTORY — PX: PEG PLACEMENT: SHX5437

## 2014-08-17 HISTORY — PX: PORTACATH PLACEMENT: SHX2246

## 2014-08-17 HISTORY — PX: ESOPHAGOGASTRODUODENOSCOPY (EGD) WITH PROPOFOL: SHX5813

## 2014-08-17 SURGERY — INSERTION, TUNNELED CENTRAL VENOUS DEVICE, WITH PORT
Anesthesia: Monitor Anesthesia Care | Site: Chest | Laterality: Right

## 2014-08-17 MED ORDER — MIDAZOLAM HCL 2 MG/2ML IJ SOLN
INTRAMUSCULAR | Status: AC
  Filled 2014-08-17: qty 2

## 2014-08-17 MED ORDER — HEPARIN SOD (PORK) LOCK FLUSH 100 UNIT/ML IV SOLN
INTRAVENOUS | Status: AC
  Filled 2014-08-17: qty 5

## 2014-08-17 MED ORDER — LIDOCAINE HCL (PF) 1 % IJ SOLN
INTRAMUSCULAR | Status: AC
  Filled 2014-08-17: qty 30

## 2014-08-17 MED ORDER — PROPOFOL INFUSION 10 MG/ML OPTIME
INTRAVENOUS | Status: DC | PRN
  Administered 2014-08-17: 75 ug/kg/min via INTRAVENOUS
  Administered 2014-08-17: 11:00:00 via INTRAVENOUS

## 2014-08-17 MED ORDER — HEPARIN SOD (PORK) LOCK FLUSH 100 UNIT/ML IV SOLN
INTRAVENOUS | Status: DC | PRN
  Administered 2014-08-17: 500 [IU] via INTRAVENOUS

## 2014-08-17 MED ORDER — LIDOCAINE-PRILOCAINE 2.5-2.5 % EX CREA: TOPICAL_CREAM | CUTANEOUS | Status: DC

## 2014-08-17 MED ORDER — FENTANYL CITRATE (PF) 250 MCG/5ML IJ SOLN
INTRAMUSCULAR | Status: DC | PRN
  Administered 2014-08-17: 25 ug via INTRAVENOUS

## 2014-08-17 MED ORDER — LACTATED RINGERS IV SOLN
INTRAVENOUS | Status: DC
  Administered 2014-08-17 (×3): via INTRAVENOUS

## 2014-08-17 MED ORDER — ONDANSETRON HCL 8 MG PO TABS: ORAL_TABLET | ORAL | Status: DC

## 2014-08-17 MED ORDER — OXYCODONE-ACETAMINOPHEN 5-325 MG PO TABS: ORAL_TABLET | ORAL | Status: DC

## 2014-08-17 MED ORDER — CEFAZOLIN SODIUM-DEXTROSE 2-3 GM-% IV SOLR
INTRAVENOUS | Status: AC
  Filled 2014-08-17: qty 50

## 2014-08-17 MED ORDER — KETOROLAC TROMETHAMINE 30 MG/ML IJ SOLN
30.0000 mg | Freq: Once | INTRAMUSCULAR | Status: AC
  Administered 2014-08-17: 30 mg via INTRAVENOUS

## 2014-08-17 MED ORDER — CEFAZOLIN SODIUM-DEXTROSE 2-3 GM-% IV SOLR
2.0000 g | INTRAVENOUS | Status: AC
  Administered 2014-08-17: 2 g via INTRAVENOUS

## 2014-08-17 MED ORDER — LIDOCAINE HCL (PF) 1 % IJ SOLN
INTRAMUSCULAR | Status: DC | PRN
  Administered 2014-08-17: 12 mL
  Administered 2014-08-17: 3 mL

## 2014-08-17 MED ORDER — FENTANYL CITRATE (PF) 100 MCG/2ML IJ SOLN: 25.0000 ug | INTRAMUSCULAR | Status: DC | PRN

## 2014-08-17 MED ORDER — ONDANSETRON HCL 4 MG/2ML IJ SOLN: 4.0000 mg | Freq: Once | INTRAMUSCULAR | Status: DC | PRN

## 2014-08-17 MED ORDER — SODIUM CHLORIDE 0.9 % IV SOLN
INTRAVENOUS | Status: DC | PRN
  Administered 2014-08-17: 500 mL via INTRAMUSCULAR

## 2014-08-17 MED ORDER — ONDANSETRON HCL 4 MG/2ML IJ SOLN
4.0000 mg | Freq: Once | INTRAMUSCULAR | Status: AC
  Administered 2014-08-17: 4 mg via INTRAVENOUS

## 2014-08-17 MED ORDER — LIDOCAINE HCL (PF) 1 % IJ SOLN
INTRAMUSCULAR | Status: AC
  Filled 2014-08-17: qty 5

## 2014-08-17 MED ORDER — KETOROLAC TROMETHAMINE 30 MG/ML IJ SOLN
INTRAMUSCULAR | Status: AC
  Filled 2014-08-17: qty 1

## 2014-08-17 MED ORDER — FENTANYL CITRATE (PF) 100 MCG/2ML IJ SOLN
25.0000 ug | INTRAMUSCULAR | Status: AC
  Administered 2014-08-17 (×2): 25 ug via INTRAVENOUS

## 2014-08-17 MED ORDER — PROPOFOL 10 MG/ML IV BOLUS
INTRAVENOUS | Status: AC
  Filled 2014-08-17: qty 20

## 2014-08-17 MED ORDER — FENTANYL CITRATE (PF) 100 MCG/2ML IJ SOLN
INTRAMUSCULAR | Status: AC
  Filled 2014-08-17: qty 2

## 2014-08-17 MED ORDER — GLYCOPYRROLATE 0.2 MG/ML IJ SOLN
INTRAMUSCULAR | Status: DC | PRN
  Administered 2014-08-17: 0.2 mg via INTRAVENOUS

## 2014-08-17 MED ORDER — ONDANSETRON HCL 4 MG/2ML IJ SOLN
INTRAMUSCULAR | Status: AC
  Filled 2014-08-17: qty 2

## 2014-08-17 MED ORDER — PROCHLORPERAZINE MALEATE 10 MG PO TABS: 10.0000 mg | ORAL_TABLET | Freq: Four times a day (QID) | ORAL | Status: DC | PRN

## 2014-08-17 MED ORDER — MIDAZOLAM HCL 5 MG/5ML IJ SOLN
INTRAMUSCULAR | Status: DC | PRN
  Administered 2014-08-17: 1 mg via INTRAVENOUS

## 2014-08-17 MED ORDER — MIDAZOLAM HCL 2 MG/2ML IJ SOLN
1.0000 mg | INTRAMUSCULAR | Status: DC | PRN
  Administered 2014-08-17 (×2): 2 mg via INTRAVENOUS

## 2014-08-17 MED ORDER — CHLORHEXIDINE GLUCONATE 4 % EX LIQD: 1.0000 "application " | Freq: Once | CUTANEOUS | Status: DC

## 2014-08-17 SURGICAL SUPPLY — 37 items
APPLIER CLIP 9.375 SM OPEN (CLIP)
BAG DECANTER FOR FLEXI CONT (MISCELLANEOUS) ×3 IMPLANT
BAG HAMPER (MISCELLANEOUS) ×3 IMPLANT
CATH HICKMAN DUAL 12.0 (CATHETERS) IMPLANT
CLIP APPLIE 9.375 SM OPEN (CLIP) IMPLANT
CLOTH BEACON ORANGE TIMEOUT ST (SAFETY) ×3 IMPLANT
COVER LIGHT HANDLE STERIS (MISCELLANEOUS) ×6 IMPLANT
DECANTER SPIKE VIAL GLASS SM (MISCELLANEOUS) ×3 IMPLANT
DRAPE C-ARM FOLDED MOBILE STRL (DRAPES) ×3 IMPLANT
DURAPREP 6ML APPLICATOR 50/CS (WOUND CARE) ×3 IMPLANT
ELECT REM PT RETURN 9FT ADLT (ELECTROSURGICAL) ×3
ELECTRODE REM PT RTRN 9FT ADLT (ELECTROSURGICAL) ×2 IMPLANT
GLOVE BIOGEL M 7.0 STRL (GLOVE) ×3 IMPLANT
GLOVE BIOGEL PI IND STRL 7.0 (GLOVE) ×2 IMPLANT
GLOVE BIOGEL PI INDICATOR 7.0 (GLOVE) ×1
GLOVE EXAM NITRILE MD LF STRL (GLOVE) ×3 IMPLANT
GLOVE SURG SS PI 7.5 STRL IVOR (GLOVE) ×6 IMPLANT
GOWN STRL REUS W/TWL LRG LVL3 (GOWN DISPOSABLE) ×6 IMPLANT
IV NS 500ML (IV SOLUTION) ×1
IV NS 500ML BAXH (IV SOLUTION) ×2 IMPLANT
KIT PORT POWER 8FR ISP MRI (CATHETERS) ×3 IMPLANT
KIT ROOM TURNOVER APOR (KITS) ×3 IMPLANT
LIQUID BAND (GAUZE/BANDAGES/DRESSINGS) ×3 IMPLANT
MANIFOLD NEPTUNE II (INSTRUMENTS) ×3 IMPLANT
NEEDLE HYPO 25X1 1.5 SAFETY (NEEDLE) ×6 IMPLANT
PACK MINOR (CUSTOM PROCEDURE TRAY) ×3 IMPLANT
PAD ARMBOARD 7.5X6 YLW CONV (MISCELLANEOUS) ×3 IMPLANT
SET BASIN LINEN APH (SET/KITS/TRAYS/PACK) ×3 IMPLANT
SET INTRODUCER 12FR PACEMAKER (SHEATH) IMPLANT
SHEATH COOK PEEL AWAY SET 8F (SHEATH) IMPLANT
SUT PROLENE 3 0 PS 2 (SUTURE) IMPLANT
SUT VIC AB 3-0 SH 27 (SUTURE) ×1
SUT VIC AB 3-0 SH 27X BRD (SUTURE) ×2 IMPLANT
SUT VIC AB 4-0 PS2 27 (SUTURE) ×3 IMPLANT
SYR 20CC LL (SYRINGE) ×3 IMPLANT
SYR CONTROL 10ML LL (SYRINGE) ×6 IMPLANT
TUBE ENDOVIVE SAFETY PEG KIT (TUBING) ×3 IMPLANT

## 2014-08-17 NOTE — Patient Instructions (Addendum)
[place]   CHEMOTHERAPY INSTRUCTIONS  Premds: Aloxi - to prevent/reduce nausea/vomiting (lasts for approximately 72 hours in your body)  Emend - to prevent/reduce nausea/vomiting (lasts for approximately 72 hours in your body) Dexamethasone - given to prevent/reduce nausea/vomiting. Side Effects of Dexamethasone - feeling energized, anxious/jittery, trouble sleeping, feeling like you are hot or flushed feeling in the face/neck. (premeds take 30 minutes to infuse)  Cisplatin - this medication is hard on your kidneys. This is why we give you a large amount of fluids through your IV while you are here getting chemo. We also need you drinking 64 oz of fluid (preferably water/decaff fluids) 2 days prior to chemo and for up to 4-5 days after chemo. Drink more if you can. This will help keep your kidneys flushed. This can also cause acute and/or delayed nausea/vomiting. You must take your nausea meds as prescribed if you get nauseated. Do not wait until you start vomiting. This med can also cause peripheral neuropathy (numbness/tingling/burning in hands/fingers/feet/toes). Let us know if this develops so that we can monitor it and treat if necessary. (takes 1 hour to infuse)   POTENTIAL SIDE EFFECTS OF TREATMENT: Increased Susceptibility to Infection, Vomiting, Constipation, Hair Thinning, Changes in Character of Skin and Nails (brittleness, dryness,etc.), Bone Marrow Suppression, Abdominal Cramping, Complete Hair Loss, Nausea, Diarrhea, Sun Sensitivity and Mouth Sores   SELF IMAGE NEEDS AND REFERRALS MADE: Obtain hair accessories as soon as possible (caps,etc.)   EDUCATIONAL MATERIALS GIVEN AND REVIEWED: Chemotherapy and You booklet Specific Instructions Sheets: Cisplatin, Aloxi, Emend, Dexamethasone, EMLA cream, Zofran, Compazine   SELF CARE ACTIVITIES WHILE ON CHEMOTHERAPY: Increase your fluid intake 48 hours prior to treatment and drink at least 2 quarts per  day after treatment., No alcohol intake., No aspirin or other medications unless approved by your oncologist., Eat foods that are light and easy to digest., Eat foods at cold or room temperature., No fried, fatty, or spicy foods immediately before or after treatment., Have teeth cleaned professionally before starting treatment. Keep dentures and partial plates clean., Use soft toothbrush and do not use mouthwashes that contain alcohol. Biotene is a good mouthwash that is available at most pharmacies or may be ordered by calling [phone]., Use warm salt water gargles (1 teaspoon salt per 1 quart warm water) before and after meals and at bedtime. Or you may rinse with 2 tablespoons of three -percent hydrogen peroxide mixed in eight ounces of water., Always use sunscreen with SPF (Sun Protection Factor) of 30 or higher., Use your nausea medication as directed to prevent nausea., Use your stool softener or laxative as directed to prevent constipation. and Use your anti-diarrheal medication as directed to stop diarrhea.  Please wash your hands for at least 30 seconds using warm soapy water. Handwashing is the #1 way to prevent the spread of germs. Stay away from sick people or people who are getting over a cold. If you develop respiratory systems such as green/yellow mucus production or productive cough or persistent cough let us know and we will see if you need an antibiotic. It is a good idea to keep a pair of gloves on when going into grocery stores/[employer] to decrease your risk of coming into contact with germs on the carts, etc. Carry alcohol hand gel with you at all times and use it frequently if out in public. All foods need to be cooked thoroughly. No raw foods. No medium or undercooked meats, eggs. If your food is  cooked medium well, it does not need to be hot pink or saturated with bloody liquid at all. Vegetables and fruits need to be washed/rinsed under the faucet with a dish detergent before being  consumed. You can eat raw fruits and vegetables unless we tell you otherwise but it would be best if you cooked them or bought frozen. Do not eat off of salad bars or hot bars unless you really trust the cleanliness of the restaurant. If you need dental work, please let Dr. Whitney Muse know before you go for your appointment so that we can coordinate the best possible time for you in regards to your chemo regimen. You need to also let your dentist know that you are actively taking chemo. We may need to do labs prior to your dental appointment. We also want your bowels moving at least every other day. If this is not happening, we need to know so that we can get you on a bowel regimen to help you go.   MEDICATIONS: You have been given prescriptions for the following medications:  Zofran '8mg'$  tablet. Take 1 tablet every 8 hours as needed for nausea/vomiting. (#1 nausea med to take, this can constipate)  Compazine '10mg'$  tablet. Take 1 tablet every 6 hours as needed for nausea/vomiting. (#2 nausea med to take, this can make you sleepy)  EMLA cream. Apply a quarter size amount to port site 1 hour prior to chemo. Do not rub in. Cover with plastic wrap.   Over-the-Counter Meds:  Miralax 1 capful in 8 oz of fluid daily. May increase to two times a day if needed. This is a stool softener. If this doesn't work proceed you can add:  Senokot S  - start with 1 tablet two times a day and increase to 4 tablets two times a day if needed. (total of 8 tablets in a 24 hour period). This is a stimulant laxative.   Call us if this does not help your bowels move.   Imodium '2mg'$  capsule. Take 2 capsules after the 1st loose stool and then 1 capsule every 2 hours until you go a total of 12 hours without having a loose stool. Call the [facility] [place] if loose stools continue.   SYMPTOMS TO REPORT AS SOON AS POSSIBLE AFTER TREATMENT:  FEVER GREATER THAN 100.5 F  CHILLS WITH OR WITHOUT FEVER  NAUSEA AND VOMITING THAT IS NOT  CONTROLLED WITH YOUR NAUSEA MEDICATION  UNUSUAL SHORTNESS OF BREATH  UNUSUAL BRUISING OR BLEEDING  TENDERNESS IN MOUTH AND THROAT WITH OR WITHOUT PRESENCE OF ULCERS  URINARY PROBLEMS  BOWEL PROBLEMS  UNUSUAL RASH    Wear comfortable clothing and clothing appropriate for easy access to any Portacath or PICC line. Let us know if there is anything that we can do to make your therapy better!      I have been informed and understand all of the instructions given to me and have received a copy. I have been instructed to call the clinic [phone] or my family physician as soon as possible for continued medical care, if indicated. I do not have any more questions at this time but understand that I may call the [facility] or the Patient Navigator at [phone] during office hours should I have questions or need assistance in obtaining follow-up care.           Cisplatin injection What is this medicine? CISPLATIN (SIS pla tin) is a chemotherapy drug. It targets fast dividing cells, like cancer cells,  and causes these cells to die. This medicine is used to treat many types of cancer like bladder, ovarian, and testicular cancers. This medicine may be used for other purposes; ask your health care provider or pharmacist if you have questions. COMMON BRAND NAME(S): Platinol, Platinol -AQ What should I tell my health care provider before I take this medicine? They need to know if you have any of these conditions: -blood disorders -hearing problems -kidney disease -recent or ongoing radiation therapy -an unusual or allergic reaction to cisplatin, carboplatin, other chemotherapy, other medicines, foods, dyes, or preservatives -pregnant or trying to get pregnant -breast-feeding How should I use this medicine? This drug is given as an infusion into a vein. It is administered in a hospital or clinic by a specially trained health care professional. Talk to your pediatrician  regarding the use of this medicine in children. Special care may be needed. Overdosage: If you think you have taken too much of this medicine contact a poison control center or emergency room at once. NOTE: This medicine is only for you. Do not share this medicine with others. What if I miss a dose? It is important not to miss a dose. Call your doctor or health care professional if you are unable to keep an appointment. What may interact with this medicine? -dofetilide -foscarnet -medicines for seizures -medicines to increase blood counts like filgrastim, pegfilgrastim, sargramostim -probenecid -pyridoxine used with altretamine -rituximab -some antibiotics like amikacin, gentamicin, neomycin, polymyxin B, streptomycin, tobramycin -sulfinpyrazone -vaccines -zalcitabine Talk to your doctor or health care professional before taking any of these medicines: -acetaminophen -aspirin -ibuprofen -ketoprofen -naproxen This list may not describe all possible interactions. Give your health care provider a list of all the medicines, herbs, non-prescription drugs, or dietary supplements you use. Also tell them if you smoke, drink alcohol, or use illegal drugs. Some items may interact with your medicine. What should I watch for while using this medicine? Your condition will be monitored carefully while you are receiving this medicine. You will need important blood work done while you are taking this medicine. This drug may make you feel generally unwell. This is not uncommon, as chemotherapy can affect healthy cells as well as cancer cells. Report any side effects. Continue your course of treatment even though you feel ill unless your doctor tells you to stop. In some cases, you may be given additional medicines to help with side effects. Follow all directions for their use. Call your doctor or health care professional for advice if you get a fever, chills or sore throat, or other symptoms of a cold or  flu. Do not treat yourself. This drug decreases your body's ability to fight infections. Try to avoid being around people who are sick. This medicine may increase your risk to bruise or bleed. Call your doctor or health care professional if you notice any unusual bleeding. Be careful brushing and flossing your teeth or using a toothpick because you may get an infection or bleed more easily. If you have any dental work done, tell your dentist you are receiving this medicine. Avoid taking products that contain aspirin, acetaminophen, ibuprofen, naproxen, or ketoprofen unless instructed by your doctor. These medicines may hide a fever. Do not become pregnant while taking this medicine. Women should inform their doctor if they wish to become pregnant or think they might be pregnant. There is a potential for serious side effects to an unborn child. Talk to your health care professional or pharmacist for more information. Do  not breast-feed an infant while taking this medicine. Drink fluids as directed while you are taking this medicine. This will help protect your kidneys. Call your doctor or health care professional if you get diarrhea. Do not treat yourself. What side effects may I notice from receiving this medicine? Side effects that you should report to your doctor or health care professional as soon as possible: -allergic reactions like skin rash, itching or hives, swelling of the face, lips, or tongue -signs of infection - fever or chills, cough, sore throat, pain or difficulty passing urine -signs of decreased platelets or bleeding - bruising, pinpoint red spots on the skin, black, tarry stools, nosebleeds -signs of decreased red blood cells - unusually weak or tired, fainting spells, lightheadedness -breathing problems -changes in hearing -gout pain -low blood counts - This drug may decrease the number of white blood cells, red blood cells and platelets. You may be at increased risk for  infections and bleeding. -nausea and vomiting -pain, swelling, redness or irritation at the injection site -pain, tingling, numbness in the hands or feet -problems with balance, movement -trouble passing urine or change in the amount of urine Side effects that usually do not require medical attention (report to your doctor or health care professional if they continue or are bothersome): -changes in vision -loss of appetite -metallic taste in the mouth or changes in taste This list may not describe all possible side effects. Call your doctor for medical advice about side effects. You may report side effects to FDA at 1-800-FDA-1088. Where should I keep my medicine? This drug is given in a hospital or clinic and will not be stored at home. NOTE: This sheet is a summary. It may not cover all possible information. If you have questions about this medicine, talk to your doctor, pharmacist, or health care provider.  2015, Elsevier/Gold Standard. (2007-06-18 14:40:54) Palonosetron Injection What is this medicine? PALONOSETRON (pal oh NOE se tron) is used to prevent nausea and vomiting caused by chemotherapy. It also helps prevent delayed nausea and vomiting that may occur a few days after your treatment. This medicine may be used for other purposes; ask your health care provider or pharmacist if you have questions. COMMON BRAND NAME(S): Aloxi What should I tell my health care provider before I take this medicine? They need to know if you have any of these conditions: -an unusual or allergic reaction to palonosetron, dolasetron, granisetron, ondansetron, other medicines, foods, dyes, or preservatives -pregnant or trying to get pregnant -breast-feeding How should I use this medicine? This medicine is for infusion into a vein. It is given by a health care professional in a hospital or clinic setting. Talk to your pediatrician regarding the use of this medicine in children. While this drug may be  prescribed for children as young as 1 month for selected conditions, precautions do apply. Overdosage: If you think you have taken too much of this medicine contact a poison control center or emergency room at once. NOTE: This medicine is only for you. Do not share this medicine with others. What if I miss a dose? This does not apply. What may interact with this medicine? -certain medicines for depression, anxiety, or psychotic disturbances -fentanyl -linezolid -MAOIs like Carbex, Eldepryl, Marplan, Nardil, and Parnate -methylene blue (injected into a vein) -tramadol This list may not describe all possible interactions. Give your health care provider a list of all the medicines, herbs, non-prescription drugs, or dietary supplements you use. Also tell them if you smoke,  drink alcohol, or use illegal drugs. Some items may interact with your medicine. What should I watch for while using this medicine? Your condition will be monitored carefully while you are receiving this medicine. What side effects may I notice from receiving this medicine? Side effects that you should report to your doctor or health care professional as soon as possible: -allergic reactions like skin rash, itching or hives, swelling of the face, lips, or tongue -breathing problems -confusion -dizziness -fast, irregular heartbeat -fever and chills -loss of balance or coordination -seizures -sweating -swelling of the hands and feet -tremors -unusually weak or tired Side effects that usually do not require medical attention (report to your doctor or health care professional if they continue or are bothersome): -constipation or diarrhea -headache This list may not describe all possible side effects. Call your doctor for medical advice about side effects. You may report side effects to FDA at 1-800-FDA-1088. Where should I keep my medicine? This drug is given in a hospital or clinic and will not be stored at home. NOTE:  This sheet is a summary. It may not cover all possible information. If you have questions about this medicine, talk to your doctor, pharmacist, or health care provider.  2015, Elsevier/Gold Standard. (2013-01-17 10:38:36) Fosaprepitant injection What is this medicine? FOSAPREPITANT (fos ap RE pi tant) is used together with other medicines to prevent nausea and vomiting caused by cancer treatment (chemotherapy). This medicine may be used for other purposes; ask your health care provider or pharmacist if you have questions. COMMON BRAND NAME(S): Emend What should I tell my health care provider before I take this medicine? They need to know if you have any of these conditions: -liver disease -an unusual or allergic reaction to fosaprepitant, aprepitant, medicines, foods, dyes, or preservatives -pregnant or trying to get pregnant -breast-feeding How should I use this medicine? This medicine is for injection into a vein. It is given by a health care professional in a hospital or clinic setting. Talk to your pediatrician regarding the use of this medicine in children. Special care may be needed. Overdosage: If you think you have taken too much of this medicine contact a poison control center or emergency room at once. NOTE: This medicine is only for you. Do not share this medicine with others. What if I miss a dose? This does not apply. What may interact with this medicine? Do not take this medicine with any of these medicines: -cisapride -pimozide -ranolazine This medicine may also interact with the following medications: -diltiazem -male hormones, like estrogens or progestins and birth control pills -medicines for fungal infections like ketoconazole and itraconazole -medicines for HIV -medicines for seizures or to control epilepsy like carbamazepine or phenytoin -medicines used for sleep or anxiety disorders like alprazolam, diazepam, or  midazolam -nefazodone -paroxetine -rifampin -some chemotherapy medications like etoposide, ifosfamide, vinblastine, vincristine -some antibiotics like clarithromycin, erythromycin, troleandomycin -steroid medicines like dexamethasone or methylprednisolone -tolbutamide -warfarin This list may not describe all possible interactions. Give your health care provider a list of all the medicines, herbs, non-prescription drugs, or dietary supplements you use. Also tell them if you smoke, drink alcohol, or use illegal drugs. Some items may interact with your medicine. What should I watch for while using this medicine? Do not take this medicine if you already have nausea and vomiting. Ask your health care provider what to do if you already have nausea. Birth control pills may not work properly while you are taking this medicine. Talk to your doctor  about using an extra method of birth control. This medicine should not be used continuously for a long time. Visit your doctor or health care professional for regular check-ups. This medicine may change your liver function blood test results. What side effects may I notice from receiving this medicine? Side effects that you should report to your doctor or health care professional as soon as possible: -allergic reactions like skin rash, itching or hives, swelling of the face, lips, or tongue -breathing problems -changes in heart rhythm -high or low blood pressure -pain, redness, or irritation at site where injected -rectal bleeding -serious dizziness or disorientation, confusion -sharp or severe stomach pain -sharp pain in your leg Side effects that usually do not require medical attention (report to your doctor or health care professional if they continue or are bothersome): -constipation or diarrhea -hair loss -headache -hiccups -loss of appetite -nausea -upset stomach -tiredness This list may not describe all possible side effects. Call your  doctor for medical advice about side effects. You may report side effects to FDA at 1-800-FDA-1088. Where should I keep my medicine? This drug is given in a hospital or clinic and will not be stored at home. NOTE: This sheet is a summary. It may not cover all possible information. If you have questions about this medicine, talk to your doctor, pharmacist, or health care provider.  2015, [employer]vier/Gold Standard. (2009-02-23 12:46:13) Dexamethasone injection What is this medicine? DEXAMETHASONE (dex a METH a sone) is a corticosteroid. It is used to treat inflammation of the skin, joints, lungs, and other organs. Common conditions treated include asthma, allergies, and arthritis. It is also used for other conditions, like blood disorders and diseases of the adrenal glands. This medicine may be used for other purposes; ask your health care provider or pharmacist if you have questions. COMMON BRAND NAME(S): Decadron, Solurex What should I tell my health care provider before I take this medicine? They need to know if you have any of these conditions: -blood clotting problems -Cushing's syndrome -diabetes -glaucoma -heart problems or disease -high blood pressure -infection like herpes, measles, tuberculosis, or chickenpox -kidney disease -liver disease -mental problems -myasthenia gravis -osteoporosis -previous heart attack -seizures -stomach, ulcer or intestine disease including colitis and diverticulitis -thyroid problem -an unusual or allergic reaction to dexamethasone, corticosteroids, other medicines, lactose, foods, dyes, or preservatives -pregnant or trying to get pregnant -breast-feeding How should I use this medicine? This medicine is for injection into a muscle, joint, lesion, soft tissue, or vein. It is given by a health care professional in a hospital or clinic setting. Talk to your pediatrician regarding the use of this medicine in children. Special care may be  needed. Overdosage: If you think you have taken too much of this medicine contact a poison control center or emergency room at once. NOTE: This medicine is only for you. Do not share this medicine with others. What if I miss a dose? This may not apply. If you are having a series of injections over a prolonged period, try not to miss an appointment. Call your doctor or health care professional to reschedule if you are unable to keep an appointment. What may interact with this medicine? Do not take this medicine with any of the following medications: -mifepristone, RU-486 -vaccines This medicine may also interact with the following medications: -amphotericin B -antibiotics like clarithromycin, erythromycin, and troleandomycin -aspirin and aspirin-like drugs -barbiturates like phenobarbital -carbamazepine -cholestyramine -cholinesterase inhibitors like donepezil, galantamine, rivastigmine, and tacrine -cyclosporine -digoxin -diuretics -ephedrine -male hormones,  like estrogens or progestins and birth control pills -indinavir -isoniazid -ketoconazole -medicines for diabetes -medicines that improve muscle tone or strength for conditions like myasthenia gravis -NSAIDs, medicines for pain and inflammation, like ibuprofen or naproxen -phenytoin -rifampin -thalidomide -warfarin This list may not describe all possible interactions. Give your health care provider a list of all the medicines, herbs, non-prescription drugs, or dietary supplements you use. Also tell them if you smoke, drink alcohol, or use illegal drugs. Some items may interact with your medicine. What should I watch for while using this medicine? Your condition will be monitored carefully while you are receiving this medicine. If you are taking this medicine for a long time, carry an identification card with your name and address, the type and dose of your medicine, and your doctor's name and address. This medicine may  increase your risk of getting an infection. Stay away from people who are sick. Tell your doctor or health care professional if you are around anyone with measles or chickenpox. Talk to your health care provider before you get any vaccines that you take this medicine. If you are going to have surgery, tell your doctor or health care professional that you have taken this medicine within the last twelve months. Ask your doctor or health care professional about your diet. You may need to lower the amount of salt you eat. The medicine can increase your blood sugar. If you are a diabetic check with your doctor if you need help adjusting the dose of your diabetic medicine. What side effects may I notice from receiving this medicine? Side effects that you should report to your doctor or health care professional as soon as possible: -allergic reactions like skin rash, itching or hives, swelling of the face, lips, or tongue -black or tarry stools -change in the amount of urine -changes in vision -confusion, excitement, restlessness, a false sense of well-being -fever, sore throat, sneezing, cough, or other signs of infection, wounds that will not heal -hallucinations -increased thirst -mental depression, mood swings, mistaken feelings of self importance or of being mistreated -pain in hips, back, ribs, arms, shoulders, or legs -pain, redness, or irritation at the injection site -redness, blistering, peeling or loosening of the skin, including inside the mouth -rounding out of face -swelling of feet or lower legs -unusual bleeding or bruising -unusual tired or weak -wounds that do not heal Side effects that usually do not require medical attention (report to your doctor or health care professional if they continue or are bothersome): -diarrhea or constipation -change in taste -headache -nausea, vomiting -skin problems, acne, thin and shiny skin -touble sleeping -unusual growth of hair on the face  or body -weight gain This list may not describe all possible side effects. Call your doctor for medical advice about side effects. You may report side effects to FDA at 1-800-FDA-1088. Where should I keep my medicine? This drug is given in a hospital or clinic and will not be stored at home. NOTE: This sheet is a summary. It may not cover all possible information. If you have questions about this medicine, talk to your doctor, pharmacist, or health care provider.  2015, Elsevier/Gold Standard. (2007-07-04 14:04:12) Lidocaine; Prilocaine cream What is this medicine? LIDOCAINE; PRILOCAINE (LYE doe kane; PRIL oh kane) is a topical anesthetic that causes loss of feeling in the skin and surrounding tissues. It is used to numb the skin before procedures or injections. This medicine may be used for other purposes; ask your health care provider or  pharmacist if you have questions. COMMON BRAND NAME(S): EMLA What should I tell my health care provider before I take this medicine? They need to know if you have any of these conditions: -glucose-6-phosphate deficiencies -heart disease -kidney or liver disease -methemoglobinemia -an unusual or allergic reaction to lidocaine, prilocaine, other medicines, foods, dyes, or preservatives -pregnant or trying to get pregnant -breast-feeding How should I use this medicine? This medicine is for external use only on the skin. Do not take by mouth. Follow the directions on the prescription label. Wash hands before and after use. Do not use more or leave in contact with the skin longer than directed. Do not apply to eyes or open wounds. It can cause irritation and blurred or temporary loss of vision. If this medicine comes in contact with your eyes, immediately rinse the eye with water. Do not touch or rub the eye. Contact your health care provider right away. Talk to your pediatrician regarding the use of this medicine in children. While this medicine may be  prescribed for children for selected conditions, precautions do apply. Overdosage: If you think you have taken too much of this medicine contact a poison control center or emergency room at once. NOTE: This medicine is only for you. Do not share this medicine with others. What if I miss a dose? This medicine is usually only applied once prior to each procedure. It must be in contact with the skin for a period of time for it to work. If you applied this medicine later than directed, tell your health care professional before starting the procedure. What may interact with this medicine? -acetaminophen -chloroquine -dapsone -medicines to control heart rhythm -nitrates like nitroglycerin and nitroprusside -other ointments, creams, or sprays that may contain anesthetic medicine -phenobarbital -phenytoin -quinine -sulfonamides like sulfacetamide, sulfamethoxazole, sulfasalazine and others This list may not describe all possible interactions. Give your health care provider a list of all the medicines, herbs, non-prescription drugs, or dietary supplements you use. Also tell them if you smoke, drink alcohol, or use illegal drugs. Some items may interact with your medicine. What should I watch for while using this medicine? Be careful to avoid injury to the treated area while it is numb and you are not aware of pain. Avoid scratching, rubbing, or exposing the treated area to hot or cold temperatures until complete sensation has returned. The numb feeling will wear off a few hours after applying the cream. What side effects may I notice from receiving this medicine? Side effects that you should report to your doctor or health care professional as soon as possible: -blurred vision -chest pain -difficulty breathing -dizziness -drowsiness -fast or irregular heartbeat -skin rash or itching -swelling of your throat, lips, or face -trembling Side effects that usually do not require medical attention  (report to your doctor or health care professional if they continue or are bothersome): -changes in ability to feel hot or cold -redness and swelling at the application site This list may not describe all possible side effects. Call your doctor for medical advice about side effects. You may report side effects to FDA at 1-800-FDA-1088. Where should I keep my medicine? Keep out of reach of children. Store at room temperature between 15 and 30 degrees C (59 and 86 degrees F). Keep container tightly closed. Throw away any unused medicine after the expiration date. NOTE: This sheet is a summary. It may not cover all possible information. If you have questions about this medicine, talk to your doctor, pharmacist,  or health care provider.  2015, Elsevier/Gold Standard. (2007-09-16 17:14:35) Ondansetron tablets What is this medicine? ONDANSETRON (on DAN se tron) is used to treat nausea and vomiting caused by chemotherapy. It is also used to prevent or treat nausea and vomiting after surgery. This medicine may be used for other purposes; ask your health care provider or pharmacist if you have questions. COMMON BRAND NAME(S): Zofran What should I tell my health care provider before I take this medicine? They need to know if you have any of these conditions: -heart disease -history of irregular heartbeat -liver disease -low levels of magnesium or potassium in the blood -an unusual or allergic reaction to ondansetron, granisetron, other medicines, foods, dyes, or preservatives -pregnant or trying to get pregnant -breast-feeding How should I use this medicine? Take this medicine by mouth with a glass of water. Follow the directions on your prescription label. Take your doses at regular intervals. Do not take your medicine more often than directed. Talk to your pediatrician regarding the use of this medicine in children. Special care may be needed. Overdosage: If you think you have taken too much of  this medicine contact a poison control center or emergency room at once. NOTE: This medicine is only for you. Do not share this medicine with others. What if I miss a dose? If you miss a dose, take it as soon as you can. If it is almost time for your next dose, take only that dose. Do not take double or extra doses. What may interact with this medicine? Do not take this medicine with any of the following medications: -apomorphine -certain medicines for fungal infections like fluconazole, itraconazole, ketoconazole, posaconazole, voriconazole -cisapride -dofetilide -dronedarone -pimozide -thioridazine -ziprasidone This medicine may also interact with the following medications: -carbamazepine -certain medicines for depression, anxiety, or psychotic disturbances -fentanyl -linezolid -MAOIs like Carbex, Eldepryl, Marplan, Nardil, and Parnate -methylene blue (injected into a vein) -other medicines that prolong the QT interval (cause an abnormal heart rhythm) -phenytoin -rifampicin -tramadol This list may not describe all possible interactions. Give your health care provider a list of all the medicines, herbs, non-prescription drugs, or dietary supplements you use. Also tell them if you smoke, drink alcohol, or use illegal drugs. Some items may interact with your medicine. What should I watch for while using this medicine? Check with your doctor or health care professional right away if you have any sign of an allergic reaction. What side effects may I notice from receiving this medicine? Side effects that you should report to your doctor or health care professional as soon as possible: -allergic reactions like skin rash, itching or hives, swelling of the face, lips or tongue -breathing problems -confusion -dizziness -fast or irregular heartbeat -feeling faint or lightheaded, falls -fever and chills -loss of balance or coordination -seizures -sweating -swelling of the hands or  feet -tightness in the chest -tremors -unusually weak or tired Side effects that usually do not require medical attention (report to your doctor or health care professional if they continue or are bothersome): -constipation or diarrhea -headache This list may not describe all possible side effects. Call your doctor for medical advice about side effects. You may report side effects to FDA at 1-800-FDA-1088. Where should I keep my medicine? Keep out of the reach of children. Store between 2 and 30 degrees C (36 and 86 degrees F). Throw away any unused medicine after the expiration date. NOTE: This sheet is a summary. It may not cover all possible information. If  you have questions about this medicine, talk to your doctor, pharmacist, or health care provider.  2015, Elsevier/Gold Standard. (2012-12-18 16:27:45) Prochlorperazine tablets What is this medicine? PROCHLORPERAZINE (proe klor PER a zeen) helps to control severe nausea and vomiting. This medicine is also used to treat schizophrenia. It can also help patients who experience anxiety that is not due to psychological illness. This medicine may be used for other purposes; ask your health care provider or pharmacist if you have questions. COMMON BRAND NAME(S): Compazine What should I tell my health care provider before I take this medicine? They need to know if you have any of these conditions: -blood disorders or disease -dementia -liver disease or jaundice -Parkinson's disease -uncontrollable movement disorder -an unusual or allergic reaction to prochlorperazine, other medicines, foods, dyes, or preservatives -pregnant or trying to get pregnant -breast-feeding How should I use this medicine? Take this medicine by mouth with a glass of water. Follow the directions on the prescription label. Take your doses at regular intervals. Do not take your medicine more often than directed. Do not stop taking this medicine suddenly. This can cause  nausea, vomiting, and dizziness. Ask your doctor or health care professional for advice. Talk to your pediatrician regarding the use of this medicine in children. Special care may be needed. While this drug may be prescribed for children as young as 2 years for selected conditions, precautions do apply. Overdosage: If you think you have taken too much of this medicine contact a poison control center or emergency room at once. NOTE: This medicine is only for you. Do not share this medicine with others. What if I miss a dose? If you miss a dose, take it as soon as you can. If it is almost time for your next dose, take only that dose. Do not take double or extra doses. What may interact with this medicine? Do not take this medicine with any of the following medications: -amoxapine -antidepressants like citalopram, escitalopram, fluoxetine, paroxetine, and sertraline -deferoxamine -dofetilide -maprotiline -tricyclic antidepressants like amitriptyline, clomipramine, imipramine, nortiptyline and others This medicine may also interact with the following medications: -lithium -medicines for pain -phenytoin -propranolol -warfarin This list may not describe all possible interactions. Give your health care provider a list of all the medicines, herbs, non-prescription drugs, or dietary supplements you use. Also tell them if you smoke, drink alcohol, or use illegal drugs. Some items may interact with your medicine. What should I watch for while using this medicine? Visit your doctor or health care professional for regular checks on your progress. You may get drowsy or dizzy. Do not drive, use machinery, or do anything that needs mental alertness until you know how this medicine affects you. Do not stand or sit up quickly, especially if you are an older patient. This reduces the risk of dizzy or fainting spells. Alcohol may interfere with the effect of this medicine. Avoid alcoholic drinks. This medicine  can reduce the response of your body to heat or cold. Dress warm in cold weather and stay hydrated in hot weather. If possible, avoid extreme temperatures like saunas, hot tubs, very hot or cold showers, or activities that can cause dehydration such as vigorous exercise. This medicine can make you more sensitive to the sun. Keep out of the sun. If you cannot avoid being in the sun, wear protective clothing and use sunscreen. Do not use sun lamps or tanning beds/booths. Your mouth may get dry. Chewing sugarless gum or sucking hard candy, and drinking plenty  of water may help. Contact your doctor if the problem does not go away or is severe. What side effects may I notice from receiving this medicine? Side effects that you should report to your doctor or health care professional as soon as possible: -blurred vision -breast enlargement in men or women -breast milk in women who are not breast-feeding -chest pain, fast or irregular heartbeat -confusion, restlessness -dark yellow or brown urine -difficulty breathing or swallowing -dizziness or fainting spells -drooling, shaking, movement difficulty (shuffling walk) or rigidity -fever, chills, sore throat -involuntary or uncontrollable movements of the eyes, mouth, head, arms, and legs -seizures -stomach area pain -unusually weak or tired -unusual bleeding or bruising -yellowing of skin or eyes Side effects that usually do not require medical attention (report to your doctor or health care professional if they continue or are bothersome): -difficulty passing urine -difficulty sleeping -headache -sexual dysfunction -skin rash, or itching This list may not describe all possible side effects. Call your doctor for medical advice about side effects. You may report side effects to FDA at 1-800-FDA-1088. Where should I keep my medicine? Keep out of the reach of children. Store at room temperature between 15 and 30 degrees C (59 and 86 degrees F).  Protect from light. Throw away any unused medicine after the expiration date. NOTE: This sheet is a summary. It may not cover all possible information. If you have questions about this medicine, talk to your doctor, pharmacist, or health care provider.  2015, Elsevier/Gold Standard. (2011-08-01 16:59:39)

## 2014-08-17 NOTE — Interval H&P Note (Signed)
History and Physical Interval Note:  08/17/2014 9:48 AM  Alexander [name]  has presented today for surgery, with the diagnosis of oropharyngeal cancer  The various methods of treatment have been discussed with the patient and family. After consideration of risks, benefits and other options for treatment, the patient has consented to  Procedure(s): INSERTION PORT-A-CATH (N/A) PERCUTANEOUS ENDOSCOPIC GASTROSTOMY (PEG) PLACEMENT (N/A) ESOPHAGOGASTRODUODENOSCOPY (EGD) WITH PROPOFOL (N/A) as a surgical intervention .  The patient's history has been reviewed, patient examined, no change in status, stable for surgery.  I have reviewed the patient's chart and labs.  Questions were answered to the patient's satisfaction.     Aviva Signs A

## 2014-08-17 NOTE — Addendum Note (Signed)
Addendum  created 08/17/14 1354 by Ollen Bowl, CRNA   Modules edited: Notes Section   Notes Section:  Delete: [id number]; File: [id number]

## 2014-08-17 NOTE — Assessment & Plan Note (Addendum)
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   Oncology history updated to reflect positive biopsy and dental procedure by Dr. Enrique [name].  I have reviewed the NCCN guidelines pertaining to treatment for advanced stage oropharyngeal malignancy, squamous cell type.  It remains clear that for fit individuals, concurrent chemotherapy with XRT using high dose cisplatin remains standard of care and preferred treatment modality.  Induction chemotherapy has not yet demonstrated convincing data regaring survival benefit and therefore is not typically utilized at this time.  Cetuximab-based therapy is reserved for unfit patients.  Fortunately, Alexander [name] is a very fit individual with an excellent [place] status.  Thus, we will treat with curative intent using high-dose Cisplatin + XRT.  He had his port and PEG placed today.  He has seen Nutritionist and has been referred to [facility] for speech pathology.  He has an appointment with Dr. Enrique [name] tomorrow for follow-up.  He has an appointment with Rad Onc later this week.  I suspect we will begin therapy next week with radiation.  He will have chemotherapy teaching this week or next with our nurse navigator.  Return in 1 week for planned chemotherapy with the start of radiation.  Tomorrow, 5/24, his case is being presented to the [facility].

## 2014-08-17 NOTE — OR Nursing (Signed)
Up to bathroom to bathroom to void

## 2014-08-17 NOTE — Transfer of Care (Signed)
Immediate Anesthesia Transfer of Care Note  Patient: Alexander [name]  Procedure(s) Performed: Procedure(s): INSERTION PORT-A-CATH (procedure #2) (Right) PERCUTANEOUS ENDOSCOPIC GASTROSTOMY (PEG) PLACEMENT (procedure #1) (N/A) ESOPHAGOGASTRODUODENOSCOPY (EGD) WITH PROPOFOL (procedure #1) (N/A)  Patient Location: PACU  Anesthesia Type:MAC  Level of Consciousness: awake, alert  and oriented  Airway & Oxygen Therapy: Patient Spontanous Breathing and Patient connected to face mask oxygen  Post-op Assessment: Report given to RN  Post vital signs: Reviewed and stable  Last Vitals:  Filed Vitals:   08/17/14 1025  BP: 123/67  Temp:   Resp: 19    Complications: No apparent anesthesia complications

## 2014-08-17 NOTE — Op Note (Signed)
Patient:  Alexander [name]  DOB:  09-05-1950  MRN:  [id number]   Preop Diagnosis:  Oropharyngeal carcinoma  Postop Diagnosis:  Same  Procedure:  Port-A-Cath insertion, percutaneous endoscopic gastrostomy tube placement with EGD  Surgeon:  Aviva Signs, M.D.  Anes:  Mac  Indications:  Patient is a 64 year old black male recently diagnosed with an oral pharyngeal carcinoma who is about to undergo chemotherapy and radiation therapy. He needs a Port-A-Cath placed as well as a PEG tube. The risks and benefits of both her seizures including bleeding, infection, pneumothorax, and bowel injury were fully explained to the patient, who gave informed consent.  Procedure note:  The PEG tube was first inserted. An endoscope was advanced down into the first portion of the duodenum without difficulty. The pylorus was noted to be patent. The stomach was easily distensible. The abdomen was then prepped with ChloraPrep. 1% Xylocaine was used for local anesthesia. A needle is advanced percutaneously into the antrum of the stomach without difficulty. The guidewire was then advanced into the stomach and this was grasped using a snare. The guidewire was then brought out with retraction of the endoscope. A gastrostomy tube was then fitted to the wire and using the pull technique, the gastrostomy tube was placed into the stomach and the tube was up a 3 cm Tawonda Legaspi at the skin level. The endoscope was advanced back down into the stomach and adequate positioning of the gastrostomy tube was noted. There was then evacuated from the stomach and the endoscope removed. A bolster was put on the gastrostomy tube which measured 20 [place] in size. Neosporin ointment was applied to the exit site. A dressing was then applied.  Next, a Port-A-Cath was inserted into the right subclavian vein. 1% Xylocaine was used for local anesthesia. An incision was made below the right clavicle. A subcutaneous pocket was then formed. A needle is  advanced into the right subclavian vein using the Seldinger technique without difficulty. A guidewire was then advanced into the right atrium under fluoroscopic guidance. An introducer and peel-away sheath were placed over the guidewire. The catheter was inserted through the peel-away sheath the peel-away sheath was removed. The catheter was then attached to the port and the port placed in subcutaneous pocket. Adequate positioning was confirmed by fluoroscopy. Good backflow of blood was noted in the port. The port was flushed with heparin flush. The subcutaneous layer was reapproximated using a 3-0 Vicryl interrupted suture. The skin was closed using a 4-0 Vicryl subcuticular suture. Liquiband was applied.  All tape and needle counts were correct at the end of the procedure. The patient was transferred to PACU in stable condition. A chest x-ray will be performed at that time.  Complications:  None  EBL:  Minimal  Specimen:  None

## 2014-08-17 NOTE — Patient Instructions (Addendum)
[place] at [facility] Discharge Instructions  RECOMMENDATIONS MADE BY THE CONSULTANT AND ANY TEST RESULTS WILL BE SENT TO YOUR REFERRING PHYSICIAN.  Exam and discussion by Robynn Pane, PA-C. Will get you scheduled for chemotherapy teaching with Lupita Raider our nurse navigator this Friday at [place] in the [place] on 2nd floor.  Got to the elevators by the [employer] and go to 2nd floor. Once you have been seen by radiation therapy and you have your schedule, let us know so we can get your chemotherapy scheduled for the same day.  Will see you the same day of chemotherapy. You will be receiving single agent chemotherapy using Cisplatin. Radiation appointment is 5/25 (Wednesday) and you need to arrive at [facility] at 7:30am.    Thank you for choosing [place] [facility] to provide your oncology and hematology care.  To afford each patient quality time with our provider, please arrive at least 15 minutes before your scheduled appointment time.    You need to re-schedule your appointment should you arrive 10 or more minutes late.  We strive to give you quality time with our providers, and arriving late affects you and other patients whose appointments are after yours.  Also, if you no show three or more times for appointments you may be dismissed from the clinic at the providers discretion.     Again, thank you for choosing [facility]  Our hope is that these requests will decrease the amount of time that you wait before being seen by our physicians.       _____________________________________________________________  Should you have questions after your visit to [facility], please contact our office at [phone] between the hours of 8:30 a.m. and 4:30 p.m.  Voicemails left after 4:30 p.m. will not be returned until the following business day.  For prescription refill requests, have your  pharmacy contact our office.    Cisplatin injection What is this medicine? CISPLATIN (SIS pla tin) is a chemotherapy drug. It targets fast dividing cells, like cancer cells, and causes these cells to die. This medicine is used to treat many types of cancer like bladder, ovarian, and testicular cancers. This medicine may be used for other purposes; ask your health care provider or pharmacist if you have questions. COMMON BRAND NAME(S): Platinol, Platinol -AQ What should I tell my health care provider before I take this medicine? They need to know if you have any of these conditions: -blood disorders -hearing problems -kidney disease -recent or ongoing radiation therapy -an unusual or allergic reaction to cisplatin, carboplatin, other chemotherapy, other medicines, foods, dyes, or preservatives -pregnant or trying to get pregnant -breast-feeding How should I use this medicine? This drug is given as an infusion into a vein. It is administered in a hospital or clinic by a specially trained health care professional. Talk to your pediatrician regarding the use of this medicine in children. Special care may be needed. Overdosage: If you think you have taken too much of this medicine contact a poison control center or emergency room at once. NOTE: This medicine is only for you. Do not share this medicine with others. What if I miss a dose? It is important not to miss a dose. Call your doctor or health care professional if you are unable to keep an appointment. What may interact with this medicine? -dofetilide -foscarnet -medicines for seizures -medicines to increase blood counts like  filgrastim, pegfilgrastim, sargramostim -probenecid -pyridoxine used with altretamine -rituximab -some antibiotics like amikacin, gentamicin, neomycin, polymyxin B, streptomycin, tobramycin -sulfinpyrazone -vaccines -zalcitabine Talk to your doctor or health care professional before taking any of these  medicines: -acetaminophen -aspirin -ibuprofen -ketoprofen -naproxen This list may not describe all possible interactions. Give your health care provider a list of all the medicines, herbs, non-prescription drugs, or dietary supplements you use. Also tell them if you smoke, drink alcohol, or use illegal drugs. Some items may interact with your medicine. What should I watch for while using this medicine? Your condition will be monitored carefully while you are receiving this medicine. You will need important blood work done while you are taking this medicine. This drug may make you feel generally unwell. This is not uncommon, as chemotherapy can affect healthy cells as well as cancer cells. Report any side effects. Continue your course of treatment even though you feel ill unless your doctor tells you to stop. In some cases, you may be given additional medicines to help with side effects. Follow all directions for their use. Call your doctor or health care professional for advice if you get a fever, chills or sore throat, or other symptoms of a cold or flu. Do not treat yourself. This drug decreases your body's ability to fight infections. Try to avoid being around people who are sick. This medicine may increase your risk to bruise or bleed. Call your doctor or health care professional if you notice any unusual bleeding. Be careful brushing and flossing your teeth or using a toothpick because you may get an infection or bleed more easily. If you have any dental work done, tell your dentist you are receiving this medicine. Avoid taking products that contain aspirin, acetaminophen, ibuprofen, naproxen, or ketoprofen unless instructed by your doctor. These medicines may hide a fever. Do not become pregnant while taking this medicine. Women should inform their doctor if they wish to become pregnant or think they might be pregnant. There is a potential for serious side effects to an unborn child. Talk to  your health care professional or pharmacist for more information. Do not breast-feed an infant while taking this medicine. Drink fluids as directed while you are taking this medicine. This will help protect your kidneys. Call your doctor or health care professional if you get diarrhea. Do not treat yourself. What side effects may I notice from receiving this medicine? Side effects that you should report to your doctor or health care professional as soon as possible: -allergic reactions like skin rash, itching or hives, swelling of the face, lips, or tongue -signs of infection - fever or chills, cough, sore throat, pain or difficulty passing urine -signs of decreased platelets or bleeding - bruising, pinpoint red spots on the skin, black, tarry stools, nosebleeds -signs of decreased red blood cells - unusually weak or tired, fainting spells, lightheadedness -breathing problems -changes in hearing -gout pain -low blood counts - This drug may decrease the number of white blood cells, red blood cells and platelets. You may be at increased risk for infections and bleeding. -nausea and vomiting -pain, swelling, redness or irritation at the injection site -pain, tingling, numbness in the hands or feet -problems with balance, movement -trouble passing urine or change in the amount of urine Side effects that usually do not require medical attention (report to your doctor or health care professional if they continue or are bothersome): -changes in vision -loss of appetite -metallic taste in the mouth or changes in  taste This list may not describe all possible side effects. Call your doctor for medical advice about side effects. You may report side effects to FDA at 1-800-FDA-1088. Where should I keep my medicine? This drug is given in a hospital or clinic and will not be stored at home. NOTE: This sheet is a summary. It may not cover all possible information. If you have questions about this medicine,  talk to your doctor, pharmacist, or health care provider.  2015, Elsevier/Gold Standard. (2007-06-18 14:40:54)

## 2014-08-17 NOTE — Progress Notes (Signed)
No PCP Per Patient No address on file  Oropharyngeal cancer  CURRENT THERAPY:  Treatment planning.  INTERVAL HISTORY: Alexander [name] 64 y.o. 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     Oropharyngeal cancer   07/27/2014 Imaging CT neck- Advanced stage oropharyngeal cancer with necrotic adenopathy accounting for the left neck swelling.   07/28/2014 Initial Diagnosis Oropharyngeal cancer   08/03/2014 Imaging CT CAP- L supraclavicular lymphadenopathy is not completely visualized. This is better seen on the previous neck CT from 07/27/2014. Otherwise, no evidence for metastatic disease in the chest, abdomen, or pelvis.   08/03/2014 Imaging Bone scan- Uptake at adjacent anterior LEFT 6, 7, 8 ribs likely representing trauma/fractures. Questionable nonspecific increased tracer localization at the posterior RIGHT 8th and 9th ribs, the adjacent nature which raises a a question of trauma as well   08/06/2014 Pathology Results Dr. Benjamine Mola- Oropharynx, biopsy, Left - INVASIVE SQUAMOUS CELL CARCINOMA.   08/12/2014 Procedure Dr. Enrique Sack- 1. Multiple extraction of tooth numbers 6, 17, 22, 23, 24, 25, 26, and 27. 3 Quadrants of alveoloplasty    I reviewed NCCN guidelines pertaining to treatment.  High-dose Cisplatin with concurrent radiation therapy remains preferred modality of treatment for advanced stage squamous cell carcinoma of oropharynx versus induction therapy but no convincing data has proven a survival benefit of induction therapy.  Therefore, we do not typically use this treatment course.  As a result, I discussed concurrent Cisplatin + XRT treatment.  We discussed the risks, benefits, alternatives, and side effects of chemotherapy.  He is agreeable to this plan.  He has an appointment with Rad Onc on May 25th at 7:30 AM.  Once they set a date for treatment, we will plan for single-agent Cisplatin.    He denies any complaints today and is seen  eating lunch in the exam room.  Past Medical History  Diagnosis Date  . Oropharyngeal cancer 07/28/2014    dx. 3 weeks ago.- Dr. Oneal [facility] [place], [place].  . Mass of neck     dx. oropharyngeal squamous cell carcinoma- Chemo. 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     has No Known Allergies.  Current Facility-Administered Medications on File Prior to Visit  Medication Dose Route Frequency Provider Last Rate Last Dose  . ceFAZolin (ANCEF) IVPB 2 g/50 mL premix  2 g Intravenous On Call to OR Aviva Signs Md, MD      . chlorhexidine (HIBICLENS) 4 % liquid 1 application  1 application Topical Once Aviva Signs Md, MD       Current Outpatient Prescriptions on File Prior to Visit  Medication Sig Dispense Refill  . oxyCODONE-acetaminophen (PERCOCET) 5-325 MG per tablet Take one or two tablets by mouth every 6 hours as needed for pain. 50 tablet 0    Past Surgical History  Procedure Laterality Date  . Panendoscopy N/A 08/06/2014    Procedure: PANENDOSCOPY WITH BIOPSY;  Surgeon: Leta Baptist, MD;  Location: [place];  Service: ENT;  Laterality: N/A;  . Multiple extractions with alveoloplasty N/A 08/12/2014    Procedure: Extraction of tooth #'s 6,17,22,23,24,25,26,27 with alveoloplasty;  Surgeon: Lenn Cal, DDS;  Location: [facility];  Service: Oral Surgery;  Laterality: N/A;    Denies any headaches, dizziness, double vision, fevers, chills, night sweats, nausea, vomiting, diarrhea, constipation, chest pain, heart palpitations, shortness of breath, blood  in stool, black tarry stool, urinary pain, urinary burning, urinary frequency, hematuria.   PHYSICAL EXAMINATION  ECOG PERFORMANCE STATUS: 0 - Asymptomatic  There were no vitals filed for this visit.  GENERAL:alert, no distress, well nourished, well developed, comfortable, cooperative, smiling and accompanied by wife, optimistic. SKIN:  skin color, texture, turgor are normal, no rashes or significant lesions HEAD: Normocephalic EYES: normal, EOMI, Conjunctiva are pink and non-injected EARS: External ears normal OROPHARYNX:lips, buccal mucosa, and tongue normal and mucous membranes are moist  NECK: left anterior cervical chain lymph node measuring 5 cm clinically with left supraclavilcular nodes, largest medially LYMPH:  As above BREAST:not examined LUNGS: not examined HEART: not examined ABDOMEN:abdomen soft and normal bowel sounds BACK: Back symmetric, no curvature. EXTREMITIES:less then 2 second capillary refill, no joint deformities, effusion, or inflammation, no skin discoloration, no cyanosis  NEURO: alert & oriented x 3 with fluent speech, no focal motor/sensory deficits, gait normal    LABORATORY DATA: CBC    Component Value Date/Time   WBC 5.5 08/11/2014 1400   RBC 4.79 08/11/2014 1400   HGB 15.1 08/11/2014 1400   HCT 45.0 08/11/2014 1400   PLT 236 08/11/2014 1400   MCV 93.9 08/11/2014 1400   MCH 31.5 08/11/2014 1400   MCHC 33.6 08/11/2014 1400   RDW 13.4 08/11/2014 1400   LYMPHSABS 2.1 07/27/2014 1808   MONOABS 0.5 07/27/2014 1808   EOSABS 0.2 07/27/2014 1808   BASOSABS 0.0 07/27/2014 1808      Chemistry      Component Value Date/Time   NA 140 08/11/2014 1400   K 4.4 08/11/2014 1400   CL 102 08/11/2014 1400   CO2 28 08/11/2014 1400   BUN 14 08/11/2014 1400   CREATININE 1.00 08/11/2014 1400      Component Value Date/Time   CALCIUM 9.5 08/11/2014 1400       RADIOGRAPHIC STUDIES:  Ct Soft Tissue Neck W Contrast  07/27/2014   CLINICAL DATA:  Left neck swelling for 1 month.  EXAM: CT NECK WITH CONTRAST  TECHNIQUE: Multidetector CT imaging of the neck was performed using the standard protocol following the bolus administration of intravenous contrast.  CONTRAST:  25m OMNIPAQUE IOHEXOL 300 MG/ML  SOLN  COMPARISON:  None.  FINDINGS: Pharynx and larynx: There is a large, centrally hypo  enhancing/necrotic mass with extensive submucosal component, epicenter at the left glossotonsillar sulcus. The mass measures at least 45 mm in diameter, and extends into left greater than right intrinsic tongue muscles, likely involving the left lingual neurovascular bundle. There is extrinsic tongue muscle involvement on the left; no visible bone erosion, carotid involvement, or pterygoid involvement.  Necrotic lymphadenopathy throughout the left neck, with the jugulodigastric node measuring 5 cm in maximal dimension. This node has macroscopic extra capsular invasion which invades the left internal jugular vein. Necrotic lymphadenopathy continues throughout the left cervical chain and also involves the left posterior triangle. No enlarged or necrotic appearing lymph nodes present on the right.  The stage is at T4a(extrinsic tongue muscle involvement) N2b  Salivary glands: Few stones in the right parotid gland.  Thyroid: Negative  Vascular: Left IJ invasion as above  Limited intracranial: No evidence of metastatic spread.  Visualized orbits: Negative  Mastoids and visualized paranasal sinuses: Negative  Skeleton: No evidence of direct invasion or osseous metastasis. 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  No nodules.  IMPRESSION: Advanced  stage oropharyngeal cancer with necrotic adenopathy accounting for the left neck swelling. Detailed staging above.   Electronically Signed   By: Monte Fantasia M.D.   On: 07/27/2014 20:09   Ct Chest W Contrast  08/03/2014   CLINICAL DATA:  Subsequent encounter for head and neck cancer.  EXAM: CT CHEST, ABDOMEN, AND PELVIS WITH CONTRAST  TECHNIQUE: Multidetector CT imaging of the chest, abdomen and pelvis was performed following the standard protocol during bolus administration of intravenous contrast.  CONTRAST:  168m OMNIPAQUE IOHEXOL  300 MG/ML  SOLN  COMPARISON:  None.  FINDINGS: CT CHEST FINDINGS  Mediastinum/Nodes: No axillary lymphadenopathy. Left supraclavicular lymphadenopathy was better seen on the previous neck CT. No mediastinal or hilar lymphadenopathy. Heart size is normal. No pericardial effusion.  Lungs/Pleura: Lung windows demonstrate centrilobular and paraseptal emphysema. No pulmonary edema or focal airspace consolidation. No suspicious pulmonary parenchymal nodule or mass. No evidence for pleural effusion.  Musculoskeletal: Bone windows reveal no worrisome lytic or sclerotic osseous lesions.  CT ABDOMEN AND PELVIS FINDINGS  Hepatobiliary: Multiple small cysts are seen scattered in the liver parenchyma. There is no evidence for gallstones, gallbladder wall thickening, or pericholecystic fluid. No intrahepatic or extrahepatic biliary dilation.  Pancreas: No focal mass lesion. No dilatation of the main duct. No intraparenchymal cyst. No peripancreatic edema.  Spleen: No splenomegaly. No focal mass lesion.  Adrenals/Urinary Tract: No adrenal nodule or mass. Kidneys are unremarkable. No evidence for hydroureter. Urinary bladder is normal by CT imaging.  Stomach/Bowel: Stomach is nondistended. No gastric wall thickening. No evidence of outlet obstruction. Duodenum is normally positioned as is the ligament of Treitz. No small bowel wall thickening. No small bowel dilatation. Terminal ileum is normal. The appendix is normal. No gross colonic mass. No colonic wall thickening. No substantial diverticular change.  Vascular/Lymphatic: There is abdominal aortic atherosclerosis without aneurysm. No gastrohepatic or hepatoduodenal ligament lymphadenopathy. No retroperitoneal lymphadenopathy appears no evidence for pelvic sidewall lymphadenopathy.  Reproductive: Prostate gland and seminal vesicles are unremarkable.  Other: No intraperitoneal free fluid.  Musculoskeletal: Bone windows reveal no worrisome lytic or sclerotic osseous lesions.  Nonacute fractures of the anterior left sixth through ninth ribs are evident. No evidence of these fractures are pathologic and the presence on contiguous ribs would be more consistent with trauma.  IMPRESSION: 1. Left supraclavicular lymphadenopathy is been incompletely visualized. This was better seen on the previous neck CT from 07/27/2014. 2. Otherwise, no evidence for metastatic disease in the chest, abdomen, or pelvis.   Electronically Signed   By: EMisty StanleyM.D.   On: 08/03/2014 16:19   Nm Bone Scan Whole Body  08/03/2014   CLINICAL DATA:  Oropharyngeal cancer newly diagnosed, staging, no osseous pain  EXAM: NUCLEAR MEDICINE WHOLE BODY BONE SCAN  TECHNIQUE: Whole body anterior and posterior images were obtained approximately 3 hours after intravenous injection of radiopharmaceutical.  RADIOPHARMACEUTICALS:  25 mCi of Technetium-948mDP IV  COMPARISON:  None  Radiographic correlation:  CT neck 07/27/2014  FINDINGS: Uptake at multiple adjacent anterior LEFT ribs approximately sixth seventh and eighth likely representing fractures.  Questionable foci of abnormal uptake at posterior RIGHT ribs approximately eighth and ninth ribs, nonspecific.  No additional abnormal sites of osseous tracer accumulation are identified which are suspicious for metastatic disease.  Minimal uptake at scattered joints typically degenerative.  Expected urinary tract and soft tissue distribution of tracer.  IMPRESSION: Uptake at adjacent anterior LEFT sixth seventh and eighth ribs likely representing trauma/fractures.  Questionable nonspecific increased tracer localization at  the posterior RIGHT eighth and ninth ribs, the adjacent nature which raises a question of trauma as well; recommend correlation with ordered followup imaging (CT chest abdomen pelvis).   Electronically Signed   By: Lavonia Dana M.D.   On: 08/03/2014 13:54   Ct Abdomen Pelvis W Contrast  08/03/2014   CLINICAL DATA:  Subsequent encounter for head and neck  cancer.  EXAM: CT CHEST, ABDOMEN, AND PELVIS WITH CONTRAST  TECHNIQUE: Multidetector CT imaging of the chest, abdomen and pelvis was performed following the standard protocol during bolus administration of intravenous contrast.  CONTRAST:  144m OMNIPAQUE IOHEXOL 300 MG/ML  SOLN  COMPARISON:  None.  FINDINGS: CT CHEST FINDINGS  Mediastinum/Nodes: No axillary lymphadenopathy. Left supraclavicular lymphadenopathy was better seen on the previous neck CT. No mediastinal or hilar lymphadenopathy. Heart size is normal. No pericardial effusion.  Lungs/Pleura: Lung windows demonstrate centrilobular and paraseptal emphysema. No pulmonary edema or focal airspace consolidation. No suspicious pulmonary parenchymal nodule or mass. No evidence for pleural effusion.  Musculoskeletal: Bone windows reveal no worrisome lytic or sclerotic osseous lesions.  CT ABDOMEN AND PELVIS FINDINGS  Hepatobiliary: Multiple small cysts are seen scattered in the liver parenchyma. There is no evidence for gallstones, gallbladder wall thickening, or pericholecystic fluid. No intrahepatic or extrahepatic biliary dilation.  Pancreas: No focal mass lesion. No dilatation of the main duct. No intraparenchymal cyst. No peripancreatic edema.  Spleen: No splenomegaly. No focal mass lesion.  Adrenals/Urinary Tract: No adrenal nodule or mass. Kidneys are unremarkable. No evidence for hydroureter. Urinary bladder is normal by CT imaging.  Stomach/Bowel: Stomach is nondistended. No gastric wall thickening. No evidence of outlet obstruction. Duodenum is normally positioned as is the ligament of Treitz. No small bowel wall thickening. No small bowel dilatation. Terminal ileum is normal. The appendix is normal. No gross colonic mass. No colonic wall thickening. No substantial diverticular change.  Vascular/Lymphatic: There is abdominal aortic atherosclerosis without aneurysm. No gastrohepatic or hepatoduodenal ligament lymphadenopathy. No retroperitoneal  lymphadenopathy appears no evidence for pelvic sidewall lymphadenopathy.  Reproductive: Prostate gland and seminal vesicles are unremarkable.  Other: No intraperitoneal free fluid.  Musculoskeletal: Bone windows reveal no worrisome lytic or sclerotic osseous lesions. Nonacute fractures of the anterior left sixth through ninth ribs are evident. No evidence of these fractures are pathologic and the presence on contiguous ribs would be more consistent with trauma.  IMPRESSION: 1. Left supraclavicular lymphadenopathy is been incompletely visualized. This was better seen on the previous neck CT from 07/27/2014. 2. Otherwise, no evidence for metastatic disease in the chest, abdomen, or pelvis.   Electronically Signed   By: EMisty StanleyM.D.   On: 08/03/2014 16:19      ASSESSMENT AND PLAN:  Oropharyngeal cancer Stage IVA (T4a, N2b, M0) invasive squamous cell carcinoma of oropharynx.   Oncology history updated to reflect positive biopsy and dental procedure by Dr. KEnrique Sack  I have reviewed the NCCN guidelines pertaining to treatment for advanced stage oropharyngeal malignancy, squamous cell type.  It remains clear that for fit individuals, concurrent chemotherapy with XRT using high dose cisplatin remains standard of care and preferred treatment modality.  Induction chemotherapy has not yet demonstrated convincing data regaring survival benefit and therefore is not typically utilized at this time.  Cetuximab-based therapy is reserved for unfit patients.  Fortunately, Mr. GBlakemanis a very fit individual with an excellent EStuckeystatus.  Thus, we will treat with curative intent using high-dose Cisplatin + XRT.  Port, PEG, RAD ONC consult.  THERAPY PLAN:  Plan on starting therapy next week following Rad Onc appointment.  All questions were answered. The patient knows to call the clinic with any problems, questions or concerns. We can certainly see the patient much sooner if necessary.  Patient and plan  discussed with Dr. Ancil Linsey and she is in agreement with the aforementioned.   This note is electronically signed by: Doy Mince 08/17/2014 8:23 AM

## 2014-08-17 NOTE — Anesthesia Postprocedure Evaluation (Deleted)
  Anesthesia Post-op Note  Patient: Alexander [name]  Procedure(s) Performed: Procedure(s): INSERTION PORT-A-CATH (procedure #2) (Right) PERCUTANEOUS ENDOSCOPIC GASTROSTOMY (PEG) PLACEMENT (procedure #1) (N/A) ESOPHAGOGASTRODUODENOSCOPY (EGD) WITH PROPOFOL (procedure #1) (N/A)  Patient Location: PACU  Anesthesia Type:MAC  Level of Consciousness: awake, alert  and oriented  Airway and Oxygen Therapy: Patient Spontanous Breathing  Post-op Pain: none  Post-op Assessment: Post-op Vital signs reviewed, Patient's Cardiovascular Status Stable, Respiratory Function Stable, Patent Airway and No signs of Nausea or vomiting  Post-op Vital Signs: Reviewed and stable  Last Vitals:  Filed Vitals:   08/17/14 1213  BP: 137/81  Pulse: 62  Temp: 36.6 C  Resp: 16    Complications: No apparent anesthesia complications

## 2014-08-17 NOTE — Anesthesia Postprocedure Evaluation (Signed)
  Anesthesia Post-op Note  Patient: Alexander [name]  Procedure(s) Performed: Procedure(s): INSERTION PORT-A-CATH (procedure #2) (Right) PERCUTANEOUS ENDOSCOPIC GASTROSTOMY (PEG) PLACEMENT (procedure #1) (N/A) ESOPHAGOGASTRODUODENOSCOPY (EGD) WITH PROPOFOL (procedure #1) (N/A)  Patient Location: PACU  Anesthesia Type:General  Level of Consciousness: awake, alert  and oriented  Airway and Oxygen Therapy: Patient Spontanous Breathing  Post-op Pain: mild  Post-op Assessment: Post-op Vital signs reviewed, Patient's Cardiovascular Status Stable, Respiratory Function Stable, Patent Airway and No signs of Nausea or vomiting  Post-op Vital Signs: Reviewed and stable  Last Vitals:  Filed Vitals:   08/17/14 1025  BP: 123/67  Temp:   Resp: 19    Complications: No apparent anesthesia complications

## 2014-08-17 NOTE — Anesthesia Preprocedure Evaluation (Signed)
Anesthesia Evaluation  Patient identified by MRN, date of birth, ID band Patient awake    Reviewed: Allergy & Precautions, NPO status , Patient's Chart, lab work & pertinent test results  Airway Mallampati: II  TM Distance: >3 FB Neck ROM: Full    Dental  (+) Edentulous Upper, Edentulous Lower Recent full extraction :   Pulmonary former smoker,  breath sounds clear to auscultation        Cardiovascular negative cardio ROS  Rhythm:Regular Rate:Normal     Neuro/Psych    GI/Hepatic negative GI ROS, Neg liver ROS,   Endo/Other  negative endocrine ROS  Renal/GU negative Renal ROS     Musculoskeletal   Abdominal   Peds  Hematology   Anesthesia Other Findings   Reproductive/Obstetrics                             Anesthesia Physical Anesthesia Plan  ASA: II  Anesthesia Plan: General and MAC   Post-op Pain Management:    Induction: Intravenous  Airway Management Planned: Simple Face Mask  Additional Equipment:   Intra-op Plan:   Post-operative Plan: Extubation in OR  Informed Consent: I have reviewed the patients History and Physical, chart, labs and discussed the procedure including the risks, benefits and alternatives for the proposed anesthesia with the patient or authorized representative who has indicated his/her understanding and acceptance.     Plan Discussed with:   Anesthesia Plan Comments:         Anesthesia Quick Evaluation

## 2014-08-17 NOTE — Discharge Instructions (Signed)
Care of a Feeding Tube Feeding tubes are often given to those who have trouble swallowing or cannot take food or medicine. A feeding tube can:   Go into the nose and down to the stomach.  Go through the skin in the belly (abdomen) and into the stomach or small bowel. SUPPLIES NEEDED TO CARE FOR THE TUBE SITE  Clean gloves.  Clean wash cloth, gauze pads, or soft paper towel.  Cotton swabs.  Skin barrier ointment or cream.  Soap and water.  Precut foam pads or gauze (that go around the tube).  Tube tape. TUBE SITE CARE 1. Have all supplies ready. 2. Wash hands well. 3.  Put on clean gloves. 4. Remove dirty foam pads or gauze near the tube site, if present. 5. Check the skin around the tube site for redness, rash, puffiness (swelling), leaking fluid, or extra tissue growth. Call your doctor if you see any of these. 6. Wet the gauze and cotton swabs with water and soap. 7. Wipe the area closest to the tube with cotton swabs. Wipe the surrounding skin with moistened gauze. Rinse with water. 8. Dry the skin and tube site with a dry gauze pad or soft paper towel. Do not use antibiotic ointments at the tube site. 9. If the skin is red, apply petroleum jelly in a circular motion, using a cotton swab. Your doctor may suggest a different cream or ointment. Use what the doctor suggests. 10. Apply a new pre-cut foam pad or gauze around the tube. Tape the edges down. Foam pads or gauze may be left off if there is no fluid at the tube site. 11. Use tape or a device that will attach your feeding tube to your skin or do as directed. Rotate where you tape the tube. 12. Sit the person up. 13. Throw away used supplies. 14. Remove gloves. 15. Wash hands. SUPPLIES NEEDED TO FLUSH A FEEDING TUBE  Clean gloves.  60 mL syringe (that connects to feeding tube).  Towel.  Water. FLUSHING A FEEDING TUBE  1. Have all supplies ready. 2. Wash hands well. 3. Put on clean gloves. 4. Pull 30 mL of  water into the syringe. 5. Bend (kink) the feeding tube while disconnecting it from the feeding-bag tubing or while removing the plug at the end of the tube. 6. Insert the tip of the syringe into the end of the feeding tube. Stop bending the tube. Slowly inject the water. 7. If you cannot inject the water, the person with the feeding tube should lay on their left side. 8. After injecting the water, remove the syringe. 9. Always flush the tube before giving the first medicine, between medicines, and after the final medicine before starting a feeding. 10. Throw away used supplies. 11. Remove gloves. 12. Wash hands. Document Released: 12/06/2011 Document Reviewed: 12/06/2011 [facility] Patient Information 2015 [place]. This information is not intended to replace advice given to you by your health care provider. Make sure you discuss any questions you have with your health care provider. Implanted Port Insertion, Care After Refer to this sheet in the next few weeks. These instructions provide you with information on caring for yourself after your procedure. Your health care provider may also give you more specific instructions. Your treatment has been planned according to current medical practices, but problems sometimes occur. Call your health care provider if you have any problems or questions after your procedure. WHAT TO EXPECT AFTER THE PROCEDURE After your procedure, it is typical to  have the following:   Discomfort at the port insertion site. Ice packs to the area will help.  Bruising on the skin over the port. This will subside in 3-4 days. HOME CARE INSTRUCTIONS  After your port is placed, you will get a manufacturer's information card. The card has information about your port. Keep this card with you at all times.   Know what kind of port you have. There are many types of ports available.   Wear a medical alert bracelet in case of an emergency. This can help alert health care  workers that you have a port.   The port can stay in for as long as your health care provider believes it is necessary.   A home health care nurse may give medicines and take care of the port.   You or a family member can get special training and directions for giving medicine and taking care of the port at home.  SEEK MEDICAL CARE IF:  16. Your port does not flush or you are unable to get a blood return.  17. You have a fever or chills. SEEK IMMEDIATE MEDICAL CARE IF:  You have new fluid or pus coming from your incision.   You notice a bad smell coming from your incision site.   You have swelling, pain, or more redness at the incision or port site.   You have chest pain or shortness of breath. Document Released: 01/01/2013 Document Revised: 03/18/2013 Document Reviewed: 01/01/2013 [facility] Patient Information 2015 [place], [place]. This information is not intended to replace advice given to you by your health care provider. Make sure you discuss any questions you have with your health care provider. Implanted [facility] Guide An implanted port is a type of central line that is placed under the skin. Central lines are used to provide IV access when treatment or nutrition needs to be given through a person's veins. Implanted ports are used for long-term IV access. An implanted port may be placed because:   You need IV medicine that would be irritating to the small veins in your hands or arms.   You need long-term IV medicines, such as antibiotics.   You need IV nutrition for a long period.   You need frequent blood draws for lab tests.   You need dialysis.  Implanted ports are usually placed in the chest area, but they can also be placed in the upper arm, the abdomen, or the leg. An implanted port has two main parts:   Reservoir. The reservoir is round and will appear as a small, raised area under your skin. The reservoir is the part where a needle is inserted to give  medicines or draw blood.   Catheter. The catheter is a thin, flexible tube that extends from the reservoir. The catheter is placed into a large vein. Medicine that is inserted into the reservoir goes into the catheter and then into the vein.  HOW WILL I CARE FOR MY INCISION SITE? Do not get the incision site wet. Bathe or shower as directed by your health care provider.  HOW IS MY PORT ACCESSED? Special steps must be taken to access the port:  18. Before the port is accessed, a numbing cream can be placed on the skin. This helps numb the skin over the port site.  19. Your health care provider uses a sterile technique to access the port. 1. Your health care provider must put on a mask and sterile gloves. 2. The skin over  your port is cleaned carefully with an antiseptic and allowed to dry. 3. The port is gently pinched between sterile gloves, and a needle is inserted into the port. 20. Only "non-coring" port needles should be used to access the port. Once the port is accessed, a blood return should be checked. This helps ensure that the port is in the vein and is not clogged.  21. If your port needs to remain accessed for a constant infusion, a clear (transparent) bandage will be placed over the needle site. The bandage and needle will need to be changed every week, or as directed by your health care provider.  22. Keep the bandage covering the needle clean and dry. Do not get it wet. Follow your health care provider's instructions on how to take a shower or bath while the port is accessed.  23. If your port does not need to stay accessed, no bandage is needed over the port.  WHAT IS FLUSHING? Flushing helps keep the port from getting clogged. Follow your health care provider's instructions on how and when to flush the port. Ports are usually flushed with saline solution or a medicine called heparin. The need for flushing will depend on how the port is used.   If the port is used for  intermittent medicines or blood draws, the port will need to be flushed:   After medicines have been given.   After blood has been drawn.   As part of routine maintenance.   If a constant infusion is running, the port may not need to be flushed.  HOW LONG WILL MY PORT STAY IMPLANTED? The port can stay in for as long as your health care provider thinks it is needed. When it is time for the port to come out, surgery will be done to remove it. The procedure is similar to the one performed when the port was put in.  WHEN SHOULD I SEEK IMMEDIATE MEDICAL CARE? When you have an implanted port, you should seek immediate medical care if:  13. You notice a bad smell coming from the incision site.  14. You have swelling, redness, or drainage at the incision site.  15. You have more swelling or pain at the port site or the surrounding area.  16. You have a fever that is not controlled with medicine. Document Released: 03/13/2005 Document Revised: 01/01/2013 Document Reviewed: 11/18/2012 [facility] Patient Information 2015 [place], [place]. This information is not intended to replace advice given to you by your health care provider. Make sure you discuss any questions you have with your health care provider.

## 2014-08-18 ENCOUNTER — Other Ambulatory Visit (HOSPITAL_COMMUNITY): Payer: Self-pay | Admitting: Oncology

## 2014-08-18 ENCOUNTER — Encounter (HOSPITAL_COMMUNITY): Payer: Self-pay | Admitting: General Surgery

## 2014-08-18 DIAGNOSIS — C109 Malignant neoplasm of oropharynx, unspecified: Secondary | ICD-10-CM

## 2014-08-18 DIAGNOSIS — C01 Malignant neoplasm of base of tongue: Secondary | ICD-10-CM

## 2014-08-19 ENCOUNTER — Other Ambulatory Visit (HOSPITAL_COMMUNITY): Payer: Self-pay | Admitting: Hematology & Oncology

## 2014-08-19 ENCOUNTER — Ambulatory Visit (HOSPITAL_COMMUNITY): Payer: Self-pay

## 2014-08-19 DIAGNOSIS — C01 Malignant neoplasm of base of tongue: Secondary | ICD-10-CM

## 2014-08-19 DIAGNOSIS — C109 Malignant neoplasm of oropharynx, unspecified: Secondary | ICD-10-CM

## 2014-08-20 ENCOUNTER — Ambulatory Visit (HOSPITAL_COMMUNITY): Payer: Self-pay | Admitting: Dentistry

## 2014-08-20 ENCOUNTER — Other Ambulatory Visit (HOSPITAL_COMMUNITY): Payer: Self-pay | Admitting: Oncology

## 2014-08-20 ENCOUNTER — Encounter (HOSPITAL_COMMUNITY): Payer: Self-pay | Admitting: Dentistry

## 2014-08-20 VITALS — BP 114/63 | HR 59 | Temp 98.3°F

## 2014-08-20 DIAGNOSIS — K08109 Complete loss of teeth, unspecified cause, unspecified class: Secondary | ICD-10-CM

## 2014-08-20 DIAGNOSIS — C01 Malignant neoplasm of base of tongue: Secondary | ICD-10-CM

## 2014-08-20 DIAGNOSIS — Z01818 Encounter for other preprocedural examination: Secondary | ICD-10-CM

## 2014-08-20 DIAGNOSIS — K0820 Unspecified atrophy of edentulous alveolar ridge: Secondary | ICD-10-CM

## 2014-08-20 NOTE — Patient Instructions (Signed)

## 2014-08-20 NOTE — Progress Notes (Signed)
POST OPERATIVE NOTE:  08/20/2014   Sabino Donovan [id number]  VITALS: BP 114/63 mmHg  Pulse 59  Temp(Src) 98.3 F (36.8 C) (Oral)  LABS:  Lab Results  Component Value Date   WBC 5.5 08/11/2014   HGB 15.1 08/11/2014   HCT 45.0 08/11/2014   MCV 93.9 08/11/2014   PLT 236 08/11/2014   BMET    Component Value Date/Time   NA 140 08/11/2014 1400   K 4.4 08/11/2014 1400   CL 102 08/11/2014 1400   CO2 28 08/11/2014 1400   GLUCOSE 106* 08/11/2014 1400   BUN 14 08/11/2014 1400   CREATININE 1.00 08/11/2014 1400   CALCIUM 9.5 08/11/2014 1400   GFRNONAA >60 08/11/2014 1400   GFRAA >60 08/11/2014 1400    No results found for: INR, PROTIME No results found for: PTT   BRADD MERLOS is status post multiple dental extractions with alveoloplasty in the operating room on 08/12/2014..  SUBJECTIVE: Patient without major discomfort from dental extractions. Some stitches still remain.   EXAM: There is no sign of infection, heme, or ooze. Sutures are loosely intact. Generalized primary closure is noted. There is atrophy of the edentulous alveolar ridges.  PROCEDURE: The patient was given a chlorhexidine gluconate rinse for 30 seconds. Sutures were then removed without complication. Patient tolerated the procedure well.  ASSESSMENT: Post operative course is consistent with dental procedures performed in the OR. Patient is now edentulous.  There is atrophy of the edentulous alveolar ridges.  PLAN: 1. Continue salt water rinses as needed to aid healing. 2. Brush tongue daily. 3. Advance diet as tolerated. 4. Return to clinic in 2-3 weeks for oral examination during treatments. 5. Patient is cleared for chemoradiation therapy starting on 08/26/2014.   Lenn Cal, DDS

## 2014-08-21 ENCOUNTER — Encounter (HOSPITAL_COMMUNITY): Payer: Medicaid Other | Attending: Hematology & Oncology

## 2014-08-21 ENCOUNTER — Inpatient Hospital Stay (HOSPITAL_COMMUNITY): Payer: Self-pay

## 2014-08-21 DIAGNOSIS — C109 Malignant neoplasm of oropharynx, unspecified: Secondary | ICD-10-CM | POA: Diagnosis present

## 2014-08-21 NOTE — Progress Notes (Signed)
Chemo teaching done and consent signed for Cisplatin. Distress screening done. PEG tube assessed and WNL including PEG site. New drsg applied. PEG tube flushed and pt shown how to flush PEG tube with 73m of bottled water. [facility] & re-demonstration of PEG tube flushes and feedings. Patient aware. New calendar made. [facility]  Dietician NBurtis Juneshaving someone check with patient's insurance regarding if they will cover @ home feedings (something other than Ensure). NOvid Curdto give patient strawberry Ensure @ next MD visit on Wed August 26, 2014. Pt has [facility] referral on June 10 in which he should see speech therapy for eval.

## 2014-08-21 NOTE — Progress Notes (Signed)
Orders for [facility] done and will be faxed later today to [facility].

## 2014-08-25 ENCOUNTER — Ambulatory Visit (HOSPITAL_COMMUNITY): Payer: MEDICAID

## 2014-08-25 ENCOUNTER — Encounter (HOSPITAL_COMMUNITY)
Admission: RE | Admit: 2014-08-25 | Discharge: 2014-08-25 | Disposition: A | Discharge status: Home / Self Care | Payer: Medicaid Other | Source: Ambulatory Visit | Attending: Oncology | Admitting: Oncology

## 2014-08-25 DIAGNOSIS — C01 Malignant neoplasm of base of tongue: Secondary | ICD-10-CM | POA: Diagnosis present

## 2014-08-25 DIAGNOSIS — Z931 Gastrostomy status: Secondary | ICD-10-CM | POA: Insufficient documentation

## 2014-08-25 DIAGNOSIS — R590 Localized enlarged lymph nodes: Secondary | ICD-10-CM | POA: Diagnosis not present

## 2014-08-25 DIAGNOSIS — N429 Disorder of prostate, unspecified: Secondary | ICD-10-CM | POA: Diagnosis not present

## 2014-08-25 LAB — GLUCOSE, CAPILLARY: GLUCOSE-CAPILLARY: 95 mg/dL (ref 65–99)

## 2014-08-25 MED ORDER — FLUDEOXYGLUCOSE F - 18 (FDG) INJECTION
7.7000 | Freq: Once | INTRAVENOUS | Status: AC | PRN
  Administered 2014-08-25: 7.7 via INTRAVENOUS

## 2014-08-26 ENCOUNTER — Encounter: Payer: Self-pay | Admitting: Dietician

## 2014-08-26 ENCOUNTER — Encounter (HOSPITAL_COMMUNITY): Payer: Self-pay | Admitting: Oncology

## 2014-08-26 ENCOUNTER — Encounter (HOSPITAL_COMMUNITY): Payer: Medicaid Other | Attending: Oncology | Admitting: Oncology

## 2014-08-26 VITALS — BP 125/71 | HR 72 | Temp 98.6°F | Resp 20 | Wt 151.8 lb

## 2014-08-26 DIAGNOSIS — C109 Malignant neoplasm of oropharynx, unspecified: Secondary | ICD-10-CM | POA: Diagnosis present

## 2014-08-26 NOTE — Assessment & Plan Note (Signed)
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   Oncology history updated.  He had his port and PEG placed.  He has seen Nutritionist and has been referred to [facility] for speech pathology.  He is S/P treatment by oncology dentist, Dr. Enrique Sack.  He has seen Rad Onc, Dr. Isidore Moos, and is set-up to start radiation on 6/9.  We will begin systemic chemotherapy as planned next week.   Return as scheduled on 6/13 for follow-up.

## 2014-08-26 NOTE — Progress Notes (Signed)
Followed up with patient when dropping off Ensure.   Wt Readings from Last 10 Encounters:  08/26/14 151 lb 12.8 oz (68.856 kg)  08/17/14 155 lb (70.308 kg)  08/17/14 157 lb 8 oz (71.442 kg)  08/12/14 155 lb 6 oz (70.478 kg)  08/06/14 154 lb 9.6 oz (70.126 kg)  08/05/14 153 lb 8 oz (69.627 kg)  07/27/14 178 lb (80.74 kg)   Patient weight has decreased by about 5 lbs in 2 weeks  Very briefly spoke to patient as he was about to leave.   Patient reports oral intake as  Fair and is suffering from symptoms including trouble swallowing. He eats ~4 smaller meals a day.   He has not used his PEG yet. He says he would not like to use it for as long as possible. He start radiation in about a week. He believes as the tumour shrinks he will be able to start eating the solids again.   Discussed with pt that we should definitely start to Utilize his PEG if he continues to lose weight, maintaining his nutritional status is crucial.  Explained that he would likely be experiencing a very irritated throat once he starts radiation. Gave him handouts on diet modifications he can make to help him maintain a good intake  Left case of Ensure and handouts titled "Sore or Irritated Throat", "Easy-to-Chew-and-Swallow Foods" and "Full Liquid Diet"  [name] RD, LDN Nutrition Pager: [phone] 08/26/2014 10:13 AM

## 2014-08-26 NOTE — Patient Instructions (Signed)
[place] at [facility] Discharge Instructions  RECOMMENDATIONS MADE BY THE CONSULTANT AND ANY TEST RESULTS WILL BE SENT TO YOUR REFERRING PHYSICIAN. Start your ensure today. Radiation as scheduled on June 9th. Chemo as scheduled next week. Return as scheduled for follow up on June 13th.   Call for any questions or concerns.  Thank you for choosing [place] at [facility] to provide your oncology and hematology care.  To afford each patient quality time with our provider, please arrive at least 15 minutes before your scheduled appointment time.    You need to re-schedule your appointment should you arrive 10 or more minutes late.  We strive to give you quality time with our providers, and arriving late affects you and other patients whose appointments are after yours.  Also, if you no show three or more times for appointments you may be dismissed from the clinic at the providers discretion.     Again, thank you for choosing [facility].  Our hope is that these requests will decrease the amount of time that you wait before being seen by our physicians.       _____________________________________________________________  Should you have questions after your visit to [facility], please contact our office at [phone] between the hours of 8:30 a.m. and 4:30 p.m.  Voicemails left after 4:30 p.m. will not be returned until the following business day.  For prescription refill requests, have your pharmacy contact our office.

## 2014-08-26 NOTE — Progress Notes (Signed)
No PCP Per Patient No address on file  Oropharyngeal cancer  CURRENT THERAPY:  Treatment planning.  INTERVAL HISTORY: Alexander [name] 64 y.o. 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     Oropharyngeal cancer   07/27/2014 Imaging CT neck- Advanced stage oropharyngeal cancer with necrotic adenopathy accounting for the left neck swelling.   07/28/2014 Initial Diagnosis Oropharyngeal cancer   08/03/2014 Imaging CT CAP- L supraclavicular lymphadenopathy is not completely visualized. This is better seen on the previous neck CT from 07/27/2014. Otherwise, no evidence for metastatic disease in the chest, abdomen, or pelvis.   08/03/2014 Imaging Bone scan- Uptake at adjacent anterior LEFT 6, 7, 8 ribs likely representing trauma/fractures. Questionable nonspecific increased tracer localization at the posterior RIGHT 8th and 9th ribs, the adjacent nature which raises a a question of trauma as well   08/06/2014 Pathology Results Dr. Benjamine Mola- Oropharynx, biopsy, Left - INVASIVE SQUAMOUS CELL CARCINOMA.   08/12/2014 Procedure Dr. Enrique Sack- 1. Multiple extraction of tooth numbers 6, 17, 22, 23, 24, 25, 26, and 27. 3 Quadrants of alveoloplasty    He has seen Rad Onc, Dr. Isidore Moos who wished to have a PET scan.  This was ordered and completed yesterday, but oddly, the report is not yet available to me at the time of this dictation.  Images are available for review.  I personally reviewed and went over radiographic studies with the patient.  The results are noted within this dictation.    Rad Onc consult note appreciated and reviewed.  He was simulated on 5/25 and he reports that he will be starting radiation on 6/9.  We will pursue systemic chemotherapy as planned.  He denies any complaints today and is seen eating lunch in the exam room.  Past Medical History  Diagnosis Date  . Oropharyngeal cancer 07/28/2014    dx. 3 weeks ago.- Dr. [facility]  [place], [place].  . 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     has No Known Allergies.  Current Outpatient Prescriptions on File Prior to Visit  Medication Sig Dispense Refill  . oxyCODONE-acetaminophen (PERCOCET) 5-325 MG per tablet Take one or two tablets by mouth every 6 hours as needed for pain. 50 tablet 0  . polyethylene glycol (MIRALAX / GLYCOLAX) packet Take 17 g by mouth daily.    Marland Kitchen CISPLATIN IV Inject into the vein every 21 ( twenty-one) days. To begin with radiation    . lidocaine-prilocaine (EMLA) cream Apply a quarter size amount to port site 1 hour prior to chemo. Do not rub in. Cover with plastic wrap. (Patient not taking: Reported on 08/26/2014) 30 g 2  . ondansetron (ZOFRAN) 8 MG tablet Take 1 tablet every 8 hours as needed for nausea/vomiting. (Patient not taking: Reported on 08/26/2014) 30 tablet 2  . Pegfilgrastim (NEULASTA [place]) Inject into the skin every 21 ( twenty-one) days. To be given 24 hours after the completion of chemo    . prochlorperazine (COMPAZINE) 10 MG tablet Take 1 tablet (10 mg total) by mouth every 6 (six) hours as needed (Nausea or vomiting). (Patient not taking: Reported on 08/26/2014) 30 tablet 2   No current facility-administered medications on file prior to visit.    Past Surgical History  Procedure Laterality Date  . Panendoscopy N/A 08/06/2014    Procedure: PANENDOSCOPY WITH BIOPSY;  Surgeon: Leta Baptist, MD;  Location: [facility];  Service: ENT;  Laterality: N/A;  . Multiple extractions with alveoloplasty N/A 08/12/2014    Procedure: Extraction of tooth #'s 6,17,22,23,24,25,26,27 with alveoloplasty;  Surgeon: Lenn Cal, DDS;  Location: [facility];  Service: Oral Surgery;  Laterality: N/A;  . Peg placement Left 08/17/14  . Portacath placement Right 08/17/14  .  Portacath placement Right 08/17/2014    Procedure: INSERTION PORT-A-CATH (procedure #2);  Surgeon: Aviva Signs Md, MD;  Location: [facility];  Service: General;  Laterality: Right;  . Peg placement N/A 08/17/2014    Procedure: PERCUTANEOUS ENDOSCOPIC GASTROSTOMY (PEG) PLACEMENT (procedure #1);  Surgeon: Aviva Signs Md, MD;  Location: [facility];  Service: General;  Laterality: N/A;  . Esophagogastroduodenoscopy (egd) with propofol N/A 08/17/2014    Procedure: ESOPHAGOGASTRODUODENOSCOPY (EGD) WITH PROPOFOL (procedure #1);  Surgeon: Aviva Signs Md, MD;  Location: [facility];  Service: General;  Laterality: N/A;    Denies any headaches, dizziness, double vision, fevers, chills, night sweats, nausea, vomiting, diarrhea, constipation, chest pain, heart palpitations, shortness of breath, blood in stool, black tarry stool, urinary pain, urinary burning, urinary frequency, hematuria.   PHYSICAL EXAMINATION  ECOG PERFORMANCE STATUS: 0 - Asymptomatic  Filed Vitals:   08/26/14 0944  BP: 125/71  Pulse: 72  Temp: 98.6 F (37 C)  Resp: 20    GENERAL:alert, no distress, well nourished, well developed, comfortable, cooperative, smiling  SKIN: skin color, texture, turgor are normal, no rashes or significant lesions HEAD: Normocephalic EYES: normal, EOMI, Conjunctiva are pink and non-injected EARS: External ears normal OROPHARYNX:lips, buccal mucosa, and tongue normal and mucous membranes are moist  NECK: left anterior cervical chain lymph node measuring 5 cm clinically with left supraclavilcular nodes, largest medially LYMPH:  As above BREAST:not examined LUNGS: Clear to auscultation HEART: Regular rate and rhythm ABDOMEN:abdomen soft and normal bowel sounds BACK: Back symmetric, no curvature. EXTREMITIES:less then 2 second capillary refill, no joint deformities, effusion, or inflammation, no skin discoloration, no cyanosis  NEURO: alert & oriented x 3 with fluent speech, no focal motor/sensory deficits,  gait normal    LABORATORY DATA: CBC    Component Value Date/Time   WBC 5.5 08/11/2014 1400   RBC 4.79 08/11/2014 1400   HGB 15.1 08/11/2014 1400   HCT 45.0 08/11/2014 1400   PLT 236 08/11/2014 1400   MCV 93.9 08/11/2014 1400   MCH 31.5 08/11/2014 1400   MCHC 33.6 08/11/2014 1400   RDW 13.4 08/11/2014 1400   LYMPHSABS 2.1 07/27/2014 1808   MONOABS 0.5 07/27/2014 1808   EOSABS 0.2 07/27/2014 1808   BASOSABS 0.0 07/27/2014 1808      Chemistry      Component Value Date/Time   NA 140 08/11/2014 1400   K 4.4 08/11/2014 1400   CL 102 08/11/2014 1400   CO2 28 08/11/2014 1400   BUN 14 08/11/2014 1400   CREATININE 1.00 08/11/2014 1400      Component Value Date/Time   CALCIUM 9.5 08/11/2014 1400       RADIOGRAPHIC STUDIES:  Ct Soft Tissue Neck W Contrast  07/27/2014   CLINICAL DATA:  Left neck swelling for 1 month.  EXAM: CT NECK WITH CONTRAST  TECHNIQUE: Multidetector CT imaging of the neck was performed using the standard protocol following the bolus administration of intravenous contrast.  CONTRAST:  23m OMNIPAQUE IOHEXOL 300 MG/ML  SOLN  COMPARISON:  None.  FINDINGS: Pharynx and larynx: There is a large, centrally hypo enhancing/necrotic mass with extensive submucosal component, epicenter at the left glossotonsillar sulcus. The mass measures at least 45 mm in diameter, and extends into left greater than right intrinsic tongue muscles, likely involving the left lingual neurovascular bundle. There is extrinsic tongue muscle involvement on the left; no visible bone erosion, carotid involvement, or pterygoid involvement.  Necrotic lymphadenopathy throughout the left neck, with the jugulodigastric node measuring 5 cm in maximal dimension. This node has macroscopic extra capsular invasion which invades the left internal jugular vein. Necrotic lymphadenopathy continues throughout the left cervical chain and also involves the left posterior triangle. No enlarged or necrotic appearing lymph  nodes present on the right.  The stage is at T4a(extrinsic tongue muscle involvement) N2b  Salivary glands: Few stones in the right parotid gland.  Thyroid: Negative  Vascular: Left IJ invasion as above  Limited intracranial: No evidence of metastatic spread.  Visualized orbits: Negative  Mastoids and visualized paranasal sinuses: Negative  Skeleton: No evidence of direct invasion or osseous metastasis. 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  No nodules.  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 Detailed staging above.   Electronically Signed   By: Monte Fantasia M.D.   On: 07/27/2014 20:09   Ct Chest W Contrast  08/03/2014   CLINICAL DATA:  Subsequent encounter for head and neck cancer.  EXAM: CT CHEST, ABDOMEN, AND PELVIS WITH CONTRAST  TECHNIQUE: Multidetector CT imaging of the chest, abdomen and pelvis was performed following the standard protocol during bolus administration of intravenous contrast.  CONTRAST:  175m OMNIPAQUE IOHEXOL 300 MG/ML  SOLN  COMPARISON:  None.  FINDINGS: CT CHEST FINDINGS  Mediastinum/Nodes: No axillary lymphadenopathy. Left supraclavicular lymphadenopathy was better seen on the previous neck CT. No mediastinal or hilar lymphadenopathy. Heart size is normal. No pericardial effusion.  Lungs/Pleura: Lung windows demonstrate centrilobular and paraseptal emphysema. No pulmonary edema or focal airspace consolidation. No suspicious pulmonary parenchymal nodule or mass. No evidence for pleural effusion.  Musculoskeletal: Bone windows reveal no worrisome lytic or sclerotic osseous lesions.  CT ABDOMEN AND PELVIS FINDINGS  Hepatobiliary: Multiple small cysts are seen scattered in the liver parenchyma. There is no evidence for gallstones, gallbladder wall thickening, or  pericholecystic fluid. No intrahepatic or extrahepatic biliary dilation.  Pancreas: No focal mass lesion. No dilatation of the main duct. No intraparenchymal cyst. No peripancreatic edema.  Spleen: No splenomegaly. No focal mass lesion.  Adrenals/Urinary Tract: No adrenal nodule or mass. Kidneys are unremarkable. No evidence for hydroureter. Urinary bladder is normal by CT imaging.  Stomach/Bowel: Stomach is nondistended. No gastric wall thickening. No evidence of outlet obstruction. Duodenum is normally positioned as is the ligament of Treitz. No small bowel wall thickening. No small bowel dilatation. Terminal ileum is normal. The appendix is normal. No gross colonic mass. No colonic wall thickening. No substantial diverticular change.  Vascular/Lymphatic: There is abdominal aortic atherosclerosis without aneurysm. No gastrohepatic or hepatoduodenal ligament lymphadenopathy. No retroperitoneal lymphadenopathy appears no evidence for pelvic sidewall lymphadenopathy.  Reproductive: Prostate gland and seminal vesicles are unremarkable.  Other: No intraperitoneal free fluid.  Musculoskeletal: Bone windows reveal no worrisome lytic or sclerotic osseous lesions. Nonacute fractures of the anterior left sixth through ninth ribs are evident. No evidence of these fractures are pathologic and the presence on contiguous  ribs would be more consistent with trauma.  IMPRESSION: 1. Left supraclavicular lymphadenopathy is been incompletely visualized. This was better seen on the previous neck CT from 07/27/2014. 2. Otherwise, no evidence for metastatic disease in the chest, abdomen, or pelvis.   Electronically Signed   By: Misty Stanley M.D.   On: 08/03/2014 16:19   Nm Bone Scan Whole Body  08/03/2014   CLINICAL DATA:  Oropharyngeal cancer newly diagnosed, staging, no osseous pain  EXAM: NUCLEAR MEDICINE WHOLE BODY BONE SCAN  TECHNIQUE: Whole body anterior and posterior images were obtained approximately 3 hours after intravenous  injection of radiopharmaceutical.  RADIOPHARMACEUTICALS:  25 mCi of Technetium-89mMDP IV  COMPARISON:  None  Radiographic correlation:  CT neck 07/27/2014  FINDINGS: Uptake at multiple adjacent anterior LEFT ribs approximately sixth seventh and eighth likely representing fractures.  Questionable foci of abnormal uptake at posterior RIGHT ribs approximately eighth and ninth ribs, nonspecific.  No additional abnormal sites of osseous tracer accumulation are identified which are suspicious for metastatic disease.  Minimal uptake at scattered joints typically degenerative.  Expected urinary tract and soft tissue distribution of tracer.  IMPRESSION: Uptake at adjacent anterior LEFT sixth seventh and eighth ribs likely representing trauma/fractures.  Questionable nonspecific increased tracer localization at the posterior RIGHT eighth and ninth ribs, the adjacent nature which raises a question of trauma as well; recommend correlation with ordered followup imaging (CT chest abdomen pelvis).   Electronically Signed   By: MLavonia DanaM.D.   On: 08/03/2014 13:54   Ct Abdomen Pelvis W Contrast  08/03/2014   CLINICAL DATA:  Subsequent encounter for head and neck cancer.  EXAM: CT CHEST, ABDOMEN, AND PELVIS WITH CONTRAST  TECHNIQUE: Multidetector CT imaging of the chest, abdomen and pelvis was performed following the standard protocol during bolus administration of intravenous contrast.  CONTRAST:  1063mOMNIPAQUE IOHEXOL 300 MG/ML  SOLN  COMPARISON:  None.  FINDINGS: CT CHEST FINDINGS  Mediastinum/Nodes: No axillary lymphadenopathy. Left supraclavicular lymphadenopathy was better seen on the previous neck CT. No mediastinal or hilar lymphadenopathy. Heart size is normal. No pericardial effusion.  Lungs/Pleura: Lung windows demonstrate centrilobular and paraseptal emphysema. No pulmonary edema or focal airspace consolidation. No suspicious pulmonary parenchymal nodule or mass. No evidence for pleural effusion.   Musculoskeletal: Bone windows reveal no worrisome lytic or sclerotic osseous lesions.  CT ABDOMEN AND PELVIS FINDINGS  Hepatobiliary: Multiple small cysts are seen scattered in the liver parenchyma. There is no evidence for gallstones, gallbladder wall thickening, or pericholecystic fluid. No intrahepatic or extrahepatic biliary dilation.  Pancreas: No focal mass lesion. No dilatation of the main duct. No intraparenchymal cyst. No peripancreatic edema.  Spleen: No splenomegaly. No focal mass lesion.  Adrenals/Urinary Tract: No adrenal nodule or mass. Kidneys are unremarkable. No evidence for hydroureter. Urinary bladder is normal by CT imaging.  Stomach/Bowel: Stomach is nondistended. No gastric wall thickening. No evidence of outlet obstruction. Duodenum is normally positioned as is the ligament of Treitz. No small bowel wall thickening. No small bowel dilatation. Terminal ileum is normal. The appendix is normal. No gross colonic mass. No colonic wall thickening. No substantial diverticular change.  Vascular/Lymphatic: There is abdominal aortic atherosclerosis without aneurysm. No gastrohepatic or hepatoduodenal ligament lymphadenopathy. No retroperitoneal lymphadenopathy appears no evidence for pelvic sidewall lymphadenopathy.  Reproductive: Prostate gland and seminal vesicles are unremarkable.  Other: No intraperitoneal free fluid.  Musculoskeletal: Bone windows reveal no worrisome lytic or sclerotic osseous lesions. Nonacute fractures of the anterior left sixth through ninth ribs  are evident. No evidence of these fractures are pathologic and the presence on contiguous ribs would be more consistent with trauma.  IMPRESSION: 1. Left supraclavicular lymphadenopathy is been incompletely visualized. This was better seen on the previous neck CT from 07/27/2014. 2. Otherwise, no evidence for metastatic disease in the chest, abdomen, or pelvis.   Electronically Signed   By: Misty Stanley M.D.   On: 08/03/2014 16:19    Nm Pet Image Initial (pi) Skull Base To Thigh  08/26/2014   CLINICAL DATA:  Initial treatment strategy for squamous cell carcinoma the base of tongue.  EXAM: NUCLEAR MEDICINE PET SKULL BASE TO THIGH  TECHNIQUE: 7.6 mCi F-18 FDG was injected intravenously. Full-ring PET imaging was performed from the skull base to thigh after the radiotracer. CT data was obtained and used for attenuation correction and anatomic localization.  FASTING BLOOD GLUCOSE:  Value: 95 mg/dl  COMPARISON:  CT 08/03/2014  FINDINGS: NECK  Large hypermetabolic mass centered at the left base of tongue measuring approximately 3 cm with SUV max equal 16.5. Metabolic activity extends across midline to the right base tongue (image 30).  Intensely hypermetabolic large necrotic lymph node measures 5.8 cm beneath the left sternocleidomastoid muscle. There are multiple hypermetabolic left cervical lymph nodes (level 2 and level 3). The lowest metabolic lymph node is a left supraclavicular lymph node measuring 16 mm short axis (image 51) with SUV max 11.3.  There are no hypermetabolic contralateral right cervical lymph nodes. Metabolic activity is present within the right submandibular gland which is felt to be physiologic  CHEST  No hypermetabolic mediastinal or hilar nodes. No suspicious pulmonary nodules on the CT scan.  ABDOMEN/PELVIS  No abnormal hypermetabolic activity within the liver, pancreas, adrenal glands, or spleen. No hypermetabolic lymph nodes in the abdomen or pelvis. Expected metabolic activity associated the percutaneous gastrostomy tube  There is a focus of intense metabolic activity associated with the right lobe of the prostate gland involving the mid and lateral gland. There is a high-density region within this area on comparison CT scan measuring 20 mm. This activity is intense with SUV max equal 19.  SKELETON  There are multiple foci of abnormal uptake within the left anterior medial lower ribs with healed fractures on CT  portion. These felt to be posttraumatic healed fractures.  IMPRESSION: 1. Large hypermetabolic mass in the left base of tongue. Activity extends across midline to the right base tongue. 2. Intensely hypermetabolic left cervical metastatic lymph nodes. Lymph nodes extend from the left level II position to the left supraclavicular neck. 3. No hypermetabolic contralateral lymph nodes in the right neck. 4. No evidence of metastatic disease in the chest, abdomen, or pelvis. 5. Intense metabolic activity focally within the right lobe of the prostate gland with rounded lesion on CT. This is concerning for prostate neoplasm versus less likely atypical infection or inflammation. The intense metabolic activity is unusual. Consider prostate MRI versus biopsy.   Electronically Signed   By: Suzy Bouchard M.D.   On: 08/26/2014 10:20   Dg Chest Port 1 View  08/17/2014   CLINICAL DATA:  Status post port placement  EXAM: DG C-ARM 1-60 MIN - NRPT [facility]; PORTABLE CHEST - 1 VIEW  COMPARISON:  08/03/2014  FINDINGS: A right-sided chest wall port is seen with the catheter tip in the proximal superior vena cava. No pneumothorax is noted. The lungs are clear bilaterally. No bony abnormality is seen.  IMPRESSION: No evidence of pneumothorax following port placement   Electronically  Signed   By: Inez Catalina M.D.   On: 08/17/2014 12:56   Dg C-arm 1-60 Min-no Report  08/17/2014   CLINICAL DATA: oropharyngeal cancer   C-ARM 1-60 MINUTES  Fluoroscopy was utilized by the requesting physician.  No radiographic  interpretation.       ASSESSMENT AND PLAN:  Oropharyngeal cancer Stage IVA (T4a, N2b, M0) invasive squamous cell carcinoma of oropharynx.   Oncology history updated.  He had his port and PEG placed.  He has seen Nutritionist and has been referred to [facility] speech pathology.  He is S/P treatment by oncology dentist, Dr. Enrique Sack.  He has seen Rad Onc, Dr. Isidore Moos, and is set-up to start radiation on 6/9.  We will begin  systemic chemotherapy as planned next week.   Return as scheduled on 6/13 for follow-up.     THERAPY PLAN:  Will move on with therapy as planned.  All questions were answered. The patient knows to call the clinic with any problems, questions or concerns. We can certainly see the patient much sooner if necessary.  Patient and plan discussed with Dr. Ancil Linsey and she is in agreement with the aforementioned.   This note is electronically signed by: Doy Mince 08/26/2014 10:33 AM

## 2014-08-28 ENCOUNTER — Inpatient Hospital Stay (HOSPITAL_COMMUNITY): Payer: Self-pay

## 2014-08-28 ENCOUNTER — Ambulatory Visit (HOSPITAL_COMMUNITY): Payer: Self-pay | Admitting: Oncology

## 2014-09-01 ENCOUNTER — Encounter (HOSPITAL_BASED_OUTPATIENT_CLINIC_OR_DEPARTMENT_OTHER): Payer: Medicaid Other

## 2014-09-01 ENCOUNTER — Inpatient Hospital Stay (HOSPITAL_COMMUNITY): Payer: Self-pay

## 2014-09-01 VITALS — BP 123/64 | HR 59 | Temp 98.5°F | Resp 16 | Wt 150.6 lb

## 2014-09-01 DIAGNOSIS — Z5111 Encounter for antineoplastic chemotherapy: Secondary | ICD-10-CM | POA: Diagnosis not present

## 2014-09-01 DIAGNOSIS — C109 Malignant neoplasm of oropharynx, unspecified: Secondary | ICD-10-CM | POA: Diagnosis not present

## 2014-09-01 LAB — CBC WITH DIFFERENTIAL/PLATELET
Basophils Absolute: 0 10*3/uL (ref 0.0–0.1)
Basophils Relative: 0 % (ref 0–1)
Eosinophils Absolute: 0.1 10*3/uL (ref 0.0–0.7)
Eosinophils Relative: 2 % (ref 0–5)
HEMATOCRIT: 43.4 % (ref 39.0–52.0)
HEMOGLOBIN: 14.7 g/dL (ref 13.0–17.0)
LYMPHS ABS: 1.8 10*3/uL (ref 0.7–4.0)
Lymphocytes Relative: 30 % (ref 12–46)
MCH: 30.9 pg (ref 26.0–34.0)
MCHC: 33.9 g/dL (ref 30.0–36.0)
MCV: 91.2 fL (ref 78.0–100.0)
Monocytes Absolute: 0.5 10*3/uL (ref 0.1–1.0)
Monocytes Relative: 8 % (ref 3–12)
Neutro Abs: 3.5 10*3/uL (ref 1.7–7.7)
Neutrophils Relative %: 60 % (ref 43–77)
PLATELETS: 224 10*3/uL (ref 150–400)
RBC: 4.76 MIL/uL (ref 4.22–5.81)
RDW: 12.9 % (ref 11.5–15.5)
WBC: 5.9 10*3/uL (ref 4.0–10.5)

## 2014-09-01 LAB — BASIC METABOLIC PANEL
Anion gap: 9 (ref 5–15)
BUN: 11 mg/dL (ref 6–20)
CALCIUM: 9.4 mg/dL (ref 8.9–10.3)
CO2: 28 mmol/L (ref 22–32)
CREATININE: 1.04 mg/dL (ref 0.61–1.24)
Chloride: 102 mmol/L (ref 101–111)
GFR calc Af Amer: >60 mL/min (ref >60)
GFR calc non Af Amer: >60 mL/min (ref >60)
Glucose, Bld: 155 mg/dL — ABNORMAL HIGH (ref 65–99)
POTASSIUM: 3.6 mmol/L (ref 3.5–5.1)
SODIUM: 139 mmol/L (ref 135–145)

## 2014-09-01 LAB — MAGNESIUM: MAGNESIUM: 2.1 mg/dL (ref 1.7–2.4)

## 2014-09-01 LAB — TSH: TSH: 0.801 u[IU]/mL (ref 0.350–4.500)

## 2014-09-01 MED ORDER — SODIUM CHLORIDE 0.9 % IV SOLN
100.0000 mg/m2 | Freq: Once | INTRAVENOUS | Status: AC
  Administered 2014-09-01: 192 mg via INTRAVENOUS
  Filled 2014-09-01: qty 192

## 2014-09-01 MED ORDER — HEPARIN SOD (PORK) LOCK FLUSH 100 UNIT/ML IV SOLN
500.0000 [IU] | Freq: Once | INTRAVENOUS | Status: AC | PRN
  Administered 2014-09-01: 500 [IU]
  Filled 2014-09-01: qty 5

## 2014-09-01 MED ORDER — SODIUM CHLORIDE 0.9 % IJ SOLN
10.0000 mL | INTRAMUSCULAR | Status: DC | PRN
  Administered 2014-09-01: 10 mL
  Filled 2014-09-01: qty 10

## 2014-09-01 MED ORDER — PALONOSETRON HCL INJECTION 0.25 MG/5ML
0.2500 mg | Freq: Once | INTRAVENOUS | Status: AC
  Administered 2014-09-01: 0.25 mg via INTRAVENOUS
  Filled 2014-09-01: qty 5

## 2014-09-01 MED ORDER — SODIUM CHLORIDE 0.9 % IV SOLN
Freq: Once | INTRAVENOUS | Status: AC
  Administered 2014-09-01: 11:00:00 via INTRAVENOUS
  Filled 2014-09-01: qty 5

## 2014-09-01 MED ORDER — POTASSIUM CHLORIDE 2 MEQ/ML IV SOLN
Freq: Once | INTRAVENOUS | Status: AC
  Administered 2014-09-01: 09:00:00 via INTRAVENOUS
  Filled 2014-09-01: qty 10

## 2014-09-01 NOTE — Progress Notes (Signed)
Tolerated chem well. D/C home to self. Ambulatory on discharge.

## 2014-09-01 NOTE — Patient Instructions (Signed)
[facility] Discharge Instructions for Patients Receiving Chemotherapy  Today you received the following chemotherapy agents Cisplatin.  To help prevent nausea and vomiting after your treatment, we encourage you to take your nausea medication as instructed.  If you develop nausea and vomiting that is not controlled by your nausea medication, call the clinic. If it is after clinic hours your family physician or the after hours number for the clinic or go to the Emergency Department. BELOW ARE SYMPTOMS THAT SHOULD BE REPORTED IMMEDIATELY:  *FEVER GREATER THAN 101.0 F  *CHILLS WITH OR WITHOUT FEVER  NAUSEA AND VOMITING THAT IS NOT CONTROLLED WITH YOUR NAUSEA MEDICATION  *UNUSUAL SHORTNESS OF BREATH  *UNUSUAL BRUISING OR BLEEDING  TENDERNESS IN MOUTH AND THROAT WITH OR WITHOUT PRESENCE OF ULCERS  *URINARY PROBLEMS  *BOWEL PROBLEMS  UNUSUAL RASH Items with * indicate a potential emergency and should be followed up as soon as possible.  Return to clinic tomorrow for Neulasta injection.  I have been informed and understand all the instructions given to me. I know to contact the clinic, my physician, or go to the Emergency Department if any problems should occur. I do not have any questions at this time, but understand that I may call the clinic during office hours or the Patient Navigator at [phone] should I have any questions or need assistance in obtaining follow up care.    __________________________________________  _____________  __________ Signature of Patient or Authorized Representative            Date                   Time    __________________________________________ Nurse's Signature

## 2014-09-02 ENCOUNTER — Encounter (HOSPITAL_BASED_OUTPATIENT_CLINIC_OR_DEPARTMENT_OTHER): Payer: Medicaid Other

## 2014-09-02 VITALS — BP 132/84 | HR 77 | Temp 98.6°F | Resp 18

## 2014-09-02 DIAGNOSIS — Z5189 Encounter for other specified aftercare: Secondary | ICD-10-CM

## 2014-09-02 DIAGNOSIS — C109 Malignant neoplasm of oropharynx, unspecified: Secondary | ICD-10-CM

## 2014-09-02 MED ORDER — PEGFILGRASTIM INJECTION 6 MG/0.6ML ~~LOC~~
PREFILLED_SYRINGE | SUBCUTANEOUS | Status: AC
  Filled 2014-09-02: qty 0.6

## 2014-09-02 MED ORDER — PEGFILGRASTIM INJECTION 6 MG/0.6ML ~~LOC~~
6.0000 mg | PREFILLED_SYRINGE | Freq: Once | SUBCUTANEOUS | Status: AC
  Administered 2014-09-02: 6 mg via SUBCUTANEOUS

## 2014-09-02 NOTE — Patient Instructions (Signed)
[place] at [facility] Discharge Instructions  RECOMMENDATIONS MADE BY THE CONSULTANT AND ANY TEST RESULTS WILL BE SENT TO YOUR REFERRING PHYSICIAN.  Neulasta injection given today as ordered. Return as scheduled.  Thank you for choosing [place] at [facility] to provide your oncology and hematology care.  To afford each patient quality time with our provider, please arrive at least 15 minutes before your scheduled appointment time.    You need to re-schedule your appointment should you arrive 10 or more minutes late.  We strive to give you quality time with our providers, and arriving late affects you and other patients whose appointments are after yours.  Also, if you no show three or more times for appointments you may be dismissed from the clinic at the providers discretion.     Again, thank you for choosing [facility].  Our hope is that these requests will decrease the amount of time that you wait before being seen by our physicians.       _____________________________________________________________  Should you have questions after your visit to [facility], please contact our office at [phone] between the hours of 8:30 a.m. and 4:30 p.m.  Voicemails left after 4:30 p.m. will not be returned until the following business day.  For prescription refill requests, have your pharmacy contact our office.

## 2014-09-02 NOTE — Progress Notes (Signed)
Patient denies any complaints post chemotherapy. Alexander [name] presents today for injection per MD orders. Neulasta '6mg'$  administered SQ in right Abdomen. Administration without incident. Patient tolerated well.

## 2014-09-03 ENCOUNTER — Inpatient Hospital Stay (HOSPITAL_COMMUNITY): Payer: Self-pay

## 2014-09-03 ENCOUNTER — Ambulatory Visit (HOSPITAL_COMMUNITY): Payer: Self-pay

## 2014-09-04 ENCOUNTER — Ambulatory Visit (HOSPITAL_COMMUNITY): Payer: Medicaid Other | Attending: Oncology | Admitting: Physical Therapy

## 2014-09-04 ENCOUNTER — Ambulatory Visit (HOSPITAL_COMMUNITY): Payer: Medicaid Other | Admitting: Speech Pathology

## 2014-09-04 ENCOUNTER — Ambulatory Visit (HOSPITAL_COMMUNITY): Payer: Self-pay

## 2014-09-04 DIAGNOSIS — C109 Malignant neoplasm of oropharynx, unspecified: Secondary | ICD-10-CM | POA: Diagnosis present

## 2014-09-04 DIAGNOSIS — R269 Unspecified abnormalities of gait and mobility: Secondary | ICD-10-CM | POA: Diagnosis not present

## 2014-09-04 DIAGNOSIS — R293 Abnormal posture: Secondary | ICD-10-CM | POA: Insufficient documentation

## 2014-09-04 NOTE — Therapy (Signed)
[place], [place], [address] Phone: [phone]   Fax:  [phone]  Physical Therapy Evaluation  Patient Details  Name: Alexander [name] MRN: [id number] Date of Birth: 10-30-1950 Referring Provider:  Baird Cancer, PA-C  Encounter Date: 09/04/2014      PT End of Session - 09/04/14 1033    Visit Number 1   Number of Visits 1   Authorization Type Medicare/Medicaid    Authorization Time Period 09/04/14 to 11/04/14   Authorization - Visit Number 1   Authorization - Number of Visits 1   PT Start Time 0934   PT Stop Time 1008   PT Time Calculation (min) 34 min   Activity Tolerance Patient tolerated treatment well   Behavior During Therapy [name] for tasks assessed/performed      Past Medical History  Diagnosis Date  . Oropharyngeal cancer 07/28/2014    dx. 3 weeks ago.- Dr. Oneal [facility] [place], [place].  . 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 Cancer     oropharyngeal ca    Past Surgical History  Procedure Laterality Date  . Panendoscopy N/A 08/06/2014    Procedure: PANENDOSCOPY WITH BIOPSY;  Surgeon: Leta Baptist, MD;  Location: [facility];  Service: ENT;  Laterality: N/A;  . Multiple extractions with alveoloplasty N/A 08/12/2014    Procedure: Extraction of tooth #'s 6,17,22,23,24,25,26,27 with alveoloplasty;  Surgeon: Lenn Cal, DDS;  [facility];  Service: Oral Surgery;  Laterality: N/A;  . Peg placement Left 08/17/14  . Portacath placement Right 08/17/14  . Portacath placement Right 08/17/2014    Procedure: INSERTION PORT-A-CATH (procedure #2);  Surgeon: Aviva Signs Md, MD;  Location[facility];  Service: General;  Laterality: Right;  . Peg placement N/A 08/17/2014    Procedure: PERCUTANEOUS ENDOSCOPIC GASTROSTOMY (PEG) PLACEMENT (procedure #1);  Surgeon: Aviva Signs Md, MD;  [facility];  Service: General;  Laterality: N/A;   . Esophagogastroduodenoscopy (egd) with propofol N/A 08/17/2014    Procedure: ESOPHAGOGASTRODUODENOSCOPY (EGD) WITH PROPOFOL (procedure #1);  Surgeon: Aviva Signs Md, MD;  [facility];  Service: General;  Laterality: N/A;    There were no vitals filed for this visit.  Visit Diagnosis:  Oropharyngeal cancer - Plan: PT plan of care cert/re-cert  Abnormality of gait - Plan: PT plan of care cert/re-cert  Poor posture - Plan: PT plan of care cert/re-cert      Subjective Assessment - 09/04/14 0935    Subjective On an average day, patient does feel somewhat draioned by radiation but patient does try to keep active. His tongue is also somewhat irritated due to treatments. States that he is noticing that saliva is constantly flowing but MD states radiation should dry this up so not too concerned. Patient does not note any aspiiration of food or water.    Pertinent History Oropharnygeal cancer diagnosed in April, currently still on chemo and radition. Chemo every 21 days, radiation every weekday for 5 weeks.    Patient Stated Goals keep strength, work on balance, work on endurance    Currently in Pain? Yes   Pain Score 3    Pain Location Other (Comment)  mouth/tongue             [facility] PT Assessment - 09/04/14 0001    Assessment   Medical Diagnosis oropharyngeal cancer    Onset Date/Surgical Date --  April 2016   Next MD  Visit MD appointment today    Precautions   Precautions None   Restrictions   Weight Bearing Restrictions No   Balance Screen   Has the patient fallen in the past 6 months No   Has the patient had a decrease in activity level because of a fear of falling?  Yes   Is the patient reluctant to leave their home because of a fear of falling?  No   Prior Function   Level of Independence Independent;Independent with gait;Independent with transfers   Vocation Retired   Leisure re-do furniture    Observation/Other Assessments   Observations TUG 6.5, 5.8, 5.17; 6 MWT  1754f   SLS 30 seconds each leg    Focus on Therapeutic Outcomes (FOTO)  Facit-F 139; Fatigue VAS 1/1/1, Pain VAS 2/4/4, Distress VAS 2/3/3   Posture/Postural Control   Posture Comments reduced spinal curves, varus knees, possible pronation in stance    AROM   Right Hip External Rotation  --  full range    Right Hip Internal Rotation  43   Left Hip External Rotation  --  full rnage    Left Hip Internal Rotation  41   Right Ankle Dorsiflexion 14   Left Ankle Dorsiflexion 9   Strength   Right Hip Flexion 5/5   Right Hip Extension --  cannot tolerate prone   Right Hip ABduction 4/5   Left Hip Flexion 4+/5   Left Hip Extension --  cannot tolerate prone    Left Hip ABduction 4-/5   Right Knee Flexion 4+/5   Right Knee Extension 5/5   Left Knee Flexion 4+/5   Left Knee Extension 4+/5   Right Ankle Dorsiflexion 5/5   Left Ankle Dorsiflexion 4+/5   Ambulation/Gait   Gait Comments reduced trunk and pelvic rotation, proximal muscle weakness                            PT Education - 09/04/14 1032    Education provided Yes   Education Details prognosis, no need for skilled PT services at this time   Person(s) Educated Patient   Methods Explanation   Comprehension Verbalized understanding          PT Short Term Goals - 09/04/14 1038    PT SHORT TERM GOAL #1   Title Patient not in need of skilled PT services at this time    Time 1   Period Days   Status New           PT Long Term Goals - 09/04/14 1039    PT LONG TERM GOAL #1   Title Patient not in need of skilled PT services at this time    Time 1   Period Days   Status New               Plan - 09/04/14 1035    Clinical Impression Statement Patient presents with diagnosis of oropharngyl cancer, and is currently receiving both radiation and chemo treatments for it. Patient demonstrates high levels of strength in bilateral lower extremities and proximal muscles, good balance and balance  reaction skils, and good levels of functional endurance. At this time patient is not in need of skilled PT services, but was provided with pedometer and [facility] guidebook for his personal use. Patient also educated that if he should start to have increased weakness, balance, and fatigue due to radiation/chemo treatments down the line, that all he will need  to come back to PT is a new MD referral.    PT Frequency One time visit   PT Next Visit Plan none- one time visit    PT Home Exercise Plan advised to continue with his current levels of activity    Consulted and Agree with Plan of Care Patient          G-Codes - Sep 28, 2014 1040    Functional Assessment Tool Used Based on skilled clinical assessment of gait, balance, functional activity tolerance    Functional Limitation Mobility: Walking and moving around   Mobility: Walking and Moving Around Current Status ([id number]) At least 1 percent but less than 20 percent impaired, limited or restricted   Mobility: Walking and Moving Around Goal Status [phone]) At least 1 percent but less than 20 percent impaired, limited or restricted   Mobility: Walking and Moving Around Discharge Status [phone]) At least 1 percent but less than 20 percent impaired, limited or restricted       Problem List Patient Active Problem List   Diagnosis Date Noted  . 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 Oropharyngeal cancer 07/28/2014    Deniece Ree PT, DPT [facility] [address] [place], [place], [address] Phone: [phone]   Fax:  [phone]

## 2014-09-07 ENCOUNTER — Encounter (HOSPITAL_BASED_OUTPATIENT_CLINIC_OR_DEPARTMENT_OTHER): Payer: Medicaid Other | Admitting: Hematology & Oncology

## 2014-09-07 ENCOUNTER — Encounter (HOSPITAL_COMMUNITY): Payer: Self-pay | Admitting: Hematology & Oncology

## 2014-09-07 ENCOUNTER — Telehealth (HOSPITAL_COMMUNITY): Payer: Self-pay | Admitting: *Deleted

## 2014-09-07 VITALS — BP 108/63 | HR 75 | Temp 98.6°F | Resp 18 | Wt 150.1 lb

## 2014-09-07 DIAGNOSIS — C109 Malignant neoplasm of oropharynx, unspecified: Secondary | ICD-10-CM | POA: Diagnosis present

## 2014-09-07 MED ORDER — OXYCODONE-ACETAMINOPHEN 5-325 MG PO TABS: ORAL_TABLET | ORAL | Status: DC

## 2014-09-07 NOTE — Progress Notes (Signed)
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  Oropharyngeal cancer   Staging form: Pharynx - Oropharynx, AJCC 7th Edition     Clinical: Stage IVA (T4a, N2b, M0) - Unsigned    CURRENT THERAPY:  Cisplatin and concurrent XRT  INTERVAL HISTORY: Alexander [name] 64 y.o. 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   He is present today with his wife. Says he is dealing with everything but is still hopeful.  Mood seems uneasy, will be monitored. Says he is struggling with his tongue and speech, starts speech therapy next week with [facility] program. Wife thinks that emotionally, they both are coping.  She thinks about maybe losing him.   Radiation today, 2 treatments done thus far.  He says that he drinks Ensure, 2/day, and force feeds in between. Says that he takes care of his tube and cleans it.   Pain is manageable. He notes that "his neck mass is getting smaller."    Oropharyngeal cancer   07/27/2014 Imaging CT neck- Advanced stage oropharyngeal cancer with necrotic adenopathy accounting for the left neck swelling.   07/28/2014 Initial Diagnosis Oropharyngeal cancer   08/03/2014 Imaging CT CAP- L supraclavicular lymphadenopathy is not completely visualized. This is better seen on the previous neck CT from 07/27/2014. Otherwise, no evidence for metastatic disease in the chest, abdomen, or pelvis.   08/03/2014 Imaging Bone scan- Uptake at adjacent anterior LEFT 6, 7, 8 ribs likely representing trauma/fractures. Questionable nonspecific increased tracer localization at the posterior RIGHT 8th and 9th ribs, the adjacent nature which raises a a question of trauma as well   08/06/2014 Pathology Results Dr. Benjamine Mola- Oropharynx, biopsy, Left - INVASIVE SQUAMOUS CELL CARCINOMA.   08/12/2014 Procedure Dr. Enrique Sack- 1. Multiple extraction of tooth numbers 6, 17, 22, 23, 24, 25, 26, and 27. 3 Quadrants of  alveoloplasty     Past Medical History  Diagnosis Date  . Oropharyngeal cancer 07/28/2014    dx. 3 weeks ago.- Dr. Oneal [facility] [place], [place].  . 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     has No Known Allergies.  Current Outpatient Prescriptions on File Prior to Visit  Medication Sig Dispense Refill  . CISPLATIN IV Inject into the vein every 21 ( twenty-one) days. To begin with radiation    . lidocaine-prilocaine (EMLA) cream Apply a quarter size amount to port site 1 hour prior to chemo. Do not rub in. Cover with plastic wrap. 30 g 2  . Pegfilgrastim (NEULASTA [place]) Inject into the skin every 21 ( twenty-one) days. To be given 24 hours after the completion of chemo    . polyethylene glycol (MIRALAX / GLYCOLAX) packet Take 17 g by mouth daily.    . ondansetron (ZOFRAN) 8 MG tablet Take 1 tablet every 8 hours as needed for nausea/vomiting. (Patient not taking: Reported on 08/26/2014) 30 tablet 2  . prochlorperazine (COMPAZINE) 10 MG tablet Take 1 tablet (10 mg total) by mouth every 6 (six) hours as needed (Nausea or vomiting). (Patient not taking: Reported on 08/26/2014) 30 tablet 2   No current facility-administered medications on file prior to visit.    Past Surgical History  Procedure Laterality Date  .  Panendoscopy N/A 08/06/2014    Procedure: PANENDOSCOPY WITH BIOPSY;  Surgeon: Leta Baptist, MD;  Location: [facility];  Service: ENT;  Laterality: N/A;  . Multiple extractions with alveoloplasty N/A 08/12/2014    Procedure: Extraction of tooth #'s 6,17,22,23,24,25,26,27 with alveoloplasty;  Surgeon: Lenn Cal, DDS;  [facility];  Service: Oral Surgery;  Laterality: N/A;  . Peg placement Left 08/17/14  . Portacath placement Right 08/17/14  . Portacath placement Right 08/17/2014     Procedure: INSERTION PORT-A-CATH (procedure #2);  Surgeon: Aviva Signs Md, MD;  [facility];  Service: General;  Laterality: Right;  . Peg placement N/A 08/17/2014    Procedure: PERCUTANEOUS ENDOSCOPIC GASTROSTOMY (PEG) PLACEMENT (procedure #1);  Surgeon: Aviva Signs Md, MD;  [facility];  Service: General;  Laterality: N/A;  . Esophagogastroduodenoscopy (egd) with propofol N/A 08/17/2014    Procedure: ESOPHAGOGASTRODUODENOSCOPY (EGD) WITH PROPOFOL (procedure #1);  Surgeon: Aviva Signs Md, MD;  [facility];  Service: General;  Laterality: N/A;    Denies any headaches, dizziness, double vision, fevers, chills, night sweats, nausea, vomiting, diarrhea, constipation, chest pain, heart palpitations, shortness of breath, blood in stool, black tarry stool, urinary pain, urinary burning, urinary frequency, hematuria. 14 point review of systems was performed and is negative except as detailed under history of present illness and above    PHYSICAL EXAMINATION  ECOG PERFORMANCE STATUS: 0 - Asymptomatic  Filed Vitals:   09/07/14 0909  BP: 108/63  Pulse: 75  Temp: 98.6 F (37 C)  Resp: 18    GENERAL:alert, no distress, well nourished, well developed, comfortable, cooperative, smiling  SKIN: skin color, texture, turgor are normal, no rashes or significant lesions HEAD: Normocephalic EYES: normal, EOMI, Conjunctiva are pink and non-injected EARS: External ears normal OROPHARYNX:lips, buccal mucosa, and mucous membranes are moist  Tongue pulls to the left NECK: left anterior cervical chain lymph node measuring 5 cm clinically with left supraclavilcular nodes, largest medially Left neck mass is remarkably smaller LYMPH:  As above BREAST:not examined LUNGS: Clear to auscultation HEART: Regular rate and rhythm ABDOMEN:abdomen soft and normal bowel sounds BACK: Back symmetric, no curvature. EXTREMITIES:less then 2 second capillary refill, no joint deformities, effusion, or  inflammation, no skin discoloration, no cyanosis  NEURO: alert & oriented x 3 with fluent speech, no focal motor/sensory deficits, gait normal   LABORATORY DATA: CBC    Component Value Date/Time   WBC 5.9 09/01/2014 0847   RBC 4.76 09/01/2014 0847   HGB 14.7 09/01/2014 0847   HCT 43.4 09/01/2014 0847   PLT 224 09/01/2014 0847   MCV 91.2 09/01/2014 0847   MCH 30.9 09/01/2014 0847   MCHC 33.9 09/01/2014 0847   RDW 12.9 09/01/2014 0847   LYMPHSABS 1.8 09/01/2014 0847   MONOABS 0.5 09/01/2014 0847   EOSABS 0.1 09/01/2014 0847   BASOSABS 0.0 09/01/2014 0847      Chemistry      Component Value Date/Time   NA 139 09/01/2014 0847   K 3.6 09/01/2014 0847   CL 102 09/01/2014 0847   CO2 28 09/01/2014 0847   BUN 11 09/01/2014 0847   CREATININE 1.04 09/01/2014 0847      Component Value Date/Time   CALCIUM 9.4 09/01/2014 0847       RADIOGRAPHIC STUDIES:  Nm Pet Image Initial (pi) Skull Base To Thigh  08/26/2014   CLINICAL DATA:  Initial treatment strategy for squamous cell carcinoma the base of tongue.  EXAM: NUCLEAR MEDICINE PET SKULL BASE TO THIGH  TECHNIQUE: 7.6 mCi F-18 FDG was injected intravenously. Full-ring PET imaging was performed from the skull base to thigh after the radiotracer. CT data was obtained and used for attenuation correction and anatomic localization.  FASTING BLOOD GLUCOSE:  Value: 95 mg/dl  COMPARISON:  CT 08/03/2014  FINDINGS: NECK  Large hypermetabolic mass centered at the left base of tongue measuring approximately 3 cm with SUV max equal 16.5. Metabolic activity extends across midline to the right base tongue (image 30).  Intensely hypermetabolic large necrotic lymph node measures 5.8 cm beneath the left sternocleidomastoid muscle. There are multiple hypermetabolic left cervical lymph nodes (level 2 and level 3). The lowest metabolic lymph node is a left supraclavicular lymph node measuring 16 mm short axis (image 51) with SUV max 11.3.  There are no  hypermetabolic contralateral right cervical lymph nodes. Metabolic activity is present within the right submandibular gland which is felt to be physiologic  CHEST  No hypermetabolic mediastinal or hilar nodes. No suspicious pulmonary nodules on the CT scan.  ABDOMEN/PELVIS  No abnormal hypermetabolic activity within the liver, pancreas, adrenal glands, or spleen. No hypermetabolic lymph nodes in the abdomen or pelvis. Expected metabolic activity associated the percutaneous gastrostomy tube  There is a focus of intense metabolic activity associated with the right lobe of the prostate gland involving the mid and lateral gland. There is a high-density region within this area on comparison CT scan measuring 20 mm. This activity is intense with SUV max equal 19.  SKELETON  There are multiple foci of abnormal uptake within the left anterior medial lower ribs with healed fractures on CT portion. These felt to be posttraumatic healed fractures.  IMPRESSION: 1. Large hypermetabolic mass in the left base of tongue. Activity extends across midline to the right base tongue. 2. Intensely hypermetabolic left cervical metastatic lymph nodes. Lymph nodes extend from the left level II position to the left supraclavicular neck. 3. No hypermetabolic contralateral lymph nodes in the right neck. 4. No evidence of metastatic disease in the chest, abdomen, or pelvis. 5. Intense metabolic activity focally within the right lobe of the prostate gland with rounded lesion on CT. This is concerning for prostate neoplasm versus less likely atypical infection or inflammation. The intense metabolic activity is unusual. Consider prostate MRI versus biopsy.   Electronically Signed   By: Suzy Bouchard M.D.   On: 08/26/2014 10:20   Dg Chest Port 1 View  08/17/2014   CLINICAL DATA:  Status post port placement  EXAM: DG C-ARM 1-60 MIN - [facility]PT [facility]; PORTABLE CHEST - 1 VIEW  COMPARISON:  08/03/2014  FINDINGS: A right-sided chest wall port is seen  with the catheter tip in the proximal superior vena cava. No pneumothorax is noted. The lungs are clear bilaterally. No bony abnormality is seen.  IMPRESSION: No evidence of pneumothorax following port placement   Electronically Signed   By: Inez Catalina M.D.   On: 08/17/2014 12:56   Dg C-arm 1-60 Min-no Report  08/17/2014   CLINICAL DATA:  Status post port placement  EXAM: DG C-ARM 1-60 MIN - [facility]; PORTABLE CHEST - 1 VIEW  COMPARISON:  08/03/2014  FINDINGS: A right-sided chest wall port is seen with the catheter tip in the proximal superior vena cava. No pneumothorax is noted. The lungs are clear bilaterally. No bony abnormality is seen.  IMPRESSION: No evidence of pneumothorax following port placement   Electronically Signed   By: Inez Catalina M.D.   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    THERAPY PLAN:   He is receiving concurrent cisplatin and radiation. We started his cisplatin on June 7 secondary to the significant size of his neck mass. We will see if he is able to tolerate more than 3 cycles of cisplatin. It would be ideal to give him concurrent treatment throughout his radiation. He has definitely had an improvement in the size of his neck mass. Will move on with therapy as planned.  Mood seems uneasy, will be monitored. I have discussed with him signs and symptoms of depression. Encouraged to continuously monitor nutrition intake.  Advised to start pushing fluids and Ensure through the tube to assess his tolerance and ensure adequate daily caloric intake. IV fluids will be given as needed.  All questions were answered. The patient knows to call the clinic with any problems, questions or concerns. We can certainly see the patient much sooner if necessary.  This document serves as a record of services personally performed by Ancil Linsey, MD. It was created on her behalf by Janace Hoard, a trained medical  scribe. The creation of this record is based on the scribe's personal observations and the provider's statements to them. This document has been checked and approved by the attending provider.  I have reviewed the above documentation for accuracy and completeness, and I agree with the above. This note was electronically signed.  Kelby Fam. Whitney Muse, MD

## 2014-09-07 NOTE — Patient Instructions (Signed)
..  [facility] at [facility] Discharge Instructions  RECOMMENDATIONS MADE BY THE CONSULTANT AND ANY TEST RESULTS WILL BE SENT TO YOUR REFERRING PHYSICIAN.  Exam per Dr. Whitney Muse   Thank you for choosing [facility] at [facility] to provide your oncology and hematology care.  To afford each patient quality time with our provider, please arrive at least 15 minutes before your scheduled appointment time.    You need to re-schedule your appointment should you arrive 10 or more minutes late.  We strive to give you quality time with our providers, and arriving late affects you and other patients whose appointments are after yours.  Also, if you no show three or more times for appointments you may be dismissed from the clinic at the providers discretion.     Again, thank you for choosing [facility].  Our hope is that these requests will decrease the amount of time that you wait before being seen by our physicians.       _____________________________________________________________  Should you have questions after your visit to [facility], please contact our office at [phone] between the hours of 8:30 a.m. and 4:30 p.m.  Voicemails left after 4:30 p.m. will not be returned until the following business day.  For prescription refill requests, have your pharmacy contact our office.

## 2014-09-08 ENCOUNTER — Ambulatory Visit (HOSPITAL_COMMUNITY): Payer: Self-pay | Admitting: Hematology & Oncology

## 2014-09-10 ENCOUNTER — Ambulatory Visit (HOSPITAL_COMMUNITY): Payer: Medicaid Other | Admitting: Speech Pathology

## 2014-09-11 ENCOUNTER — Ambulatory Visit (HOSPITAL_COMMUNITY): Payer: Self-pay | Admitting: Hematology & Oncology

## 2014-09-11 ENCOUNTER — Inpatient Hospital Stay (HOSPITAL_COMMUNITY): Payer: Self-pay

## 2014-09-14 ENCOUNTER — Encounter (HOSPITAL_COMMUNITY): Payer: Self-pay | Admitting: Hematology & Oncology

## 2014-09-14 ENCOUNTER — Encounter (HOSPITAL_COMMUNITY): Payer: Self-pay | Admitting: Dentistry

## 2014-09-14 ENCOUNTER — Ambulatory Visit (HOSPITAL_COMMUNITY): Payer: Self-pay | Admitting: Dentistry

## 2014-09-14 VITALS — BP 119/65 | HR 57 | Temp 99.4°F | Wt 155.0 lb

## 2014-09-14 DIAGNOSIS — R432 Parageusia: Secondary | ICD-10-CM

## 2014-09-14 DIAGNOSIS — K08109 Complete loss of teeth, unspecified cause, unspecified class: Secondary | ICD-10-CM

## 2014-09-14 DIAGNOSIS — R1310 Dysphagia, unspecified: Secondary | ICD-10-CM

## 2014-09-14 DIAGNOSIS — C01 Malignant neoplasm of base of tongue: Secondary | ICD-10-CM

## 2014-09-14 DIAGNOSIS — K0820 Unspecified atrophy of edentulous alveolar ridge: Secondary | ICD-10-CM

## 2014-09-14 NOTE — Progress Notes (Signed)
09/14/2014  Patient Name:   Alexander [name] Date of Birth:   10/02/1950 Medical Record Number: [id number]  BP 119/65 mmHg  Pulse 57  Temp(Src) 99.4 F (37.4 C) (Oral)  Wt 155 lb (70.308 kg)  Sabino Donovan presents for oral examination during radiation therapy. Patient has completed 15 radiation treatments.  Patient has completed two cycles of chemotherapy.  REVIEW OF CHIEF COMPLAINTS:  DRY MOUTH: No. HARD TO SWALLOW: Yes, at times.  HURT TO SWALLOW: Yes, t times. TASTE CHANGES: Yes, and is still eating without feeding tube. SORES IN MOUTH: No TRISMUS: No problems with trismus  WEIGHT: 155 pounds with 8 lb. Weight loss.  HOME OH REGIMEN:  BRUSHING: Patient reminded to brush tongue daily. RINSING: Using salt water and Biotene rinses. FLUORIDE: NA TRISMUS EXERCISES:  Maximum interincisal opening: 50 mm   DENTAL EXAM:  Oral Hygiene:(PLAQUE):Edentulous. LOCATION OF MUCOSITIS: None DESCRIPTION OF SALIVA: Normal.  ANY EXPOSED BONE: None noted. OTHER WATCHED AREAS: Previous extraction sites. DX: Dysgeusia, Dysphagia, Odynophagia, Weight Loss and Edentulous, and atrophy of edentulous alveolar ridges.  RECOMMENDATIONS: 1. Brush tongue daily.  2. Use trismus exercises as directed. 3. Use Biotene Rinse or salt water/baking soda rinses. 4. Multiple sips of water as needed. 5. Return to clinic in two months for oral exam after radiation therapy. Call if problems before then.  Lenn Cal, DDS

## 2014-09-14 NOTE — Patient Instructions (Signed)
RECOMMENDATIONS: 1. Brush tongue daily. 2. Use trismus exercises as directed. 3. Use Biotene Rinse or salt water/baking soda rinses. 4. Multiple sips of water as needed. 5. Return to clinic in two months for oral exam after radiation therapy. Call if problems before then.  Ronald F. Kulinski, DDS  

## 2014-09-15 ENCOUNTER — Ambulatory Visit (HOSPITAL_COMMUNITY): Payer: Self-pay | Admitting: Oncology

## 2014-09-16 ENCOUNTER — Telehealth (HOSPITAL_COMMUNITY): Payer: Self-pay | Admitting: Speech Pathology

## 2014-09-16 NOTE — Telephone Encounter (Signed)
Patient did not show up for this apptment

## 2014-09-18 ENCOUNTER — Other Ambulatory Visit (HOSPITAL_COMMUNITY): Payer: Self-pay | Admitting: *Deleted

## 2014-09-18 ENCOUNTER — Inpatient Hospital Stay (HOSPITAL_COMMUNITY): Payer: Self-pay

## 2014-09-18 DIAGNOSIS — C109 Malignant neoplasm of oropharynx, unspecified: Secondary | ICD-10-CM

## 2014-09-18 MED ORDER — OXYCODONE-ACETAMINOPHEN 5-325 MG PO TABS: ORAL_TABLET | ORAL | Status: DC

## 2014-09-22 ENCOUNTER — Ambulatory Visit (HOSPITAL_COMMUNITY): Payer: Self-pay | Admitting: Hematology & Oncology

## 2014-09-22 ENCOUNTER — Encounter (HOSPITAL_COMMUNITY): Payer: Self-pay | Admitting: Hematology & Oncology

## 2014-09-22 ENCOUNTER — Inpatient Hospital Stay (HOSPITAL_COMMUNITY): Payer: Self-pay

## 2014-09-22 ENCOUNTER — Encounter (HOSPITAL_BASED_OUTPATIENT_CLINIC_OR_DEPARTMENT_OTHER): Payer: Medicaid Other

## 2014-09-22 ENCOUNTER — Encounter (HOSPITAL_BASED_OUTPATIENT_CLINIC_OR_DEPARTMENT_OTHER): Payer: Medicaid Other | Admitting: Hematology & Oncology

## 2014-09-22 ENCOUNTER — Encounter: Payer: Self-pay | Admitting: Dietician

## 2014-09-22 VITALS — BP 116/80 | HR 59 | Temp 98.2°F | Resp 18 | Wt 146.4 lb

## 2014-09-22 VITALS — BP 112/57 | HR 58 | Temp 98.5°F | Resp 18

## 2014-09-22 DIAGNOSIS — C760 Malignant neoplasm of head, face and neck: Secondary | ICD-10-CM | POA: Diagnosis present

## 2014-09-22 DIAGNOSIS — C109 Malignant neoplasm of oropharynx, unspecified: Secondary | ICD-10-CM | POA: Diagnosis not present

## 2014-09-22 DIAGNOSIS — K1230 Oral mucositis (ulcerative), unspecified: Secondary | ICD-10-CM

## 2014-09-22 DIAGNOSIS — Z5111 Encounter for antineoplastic chemotherapy: Secondary | ICD-10-CM | POA: Diagnosis not present

## 2014-09-22 DIAGNOSIS — G893 Neoplasm related pain (acute) (chronic): Secondary | ICD-10-CM

## 2014-09-22 DIAGNOSIS — R634 Abnormal weight loss: Secondary | ICD-10-CM

## 2014-09-22 DIAGNOSIS — C01 Malignant neoplasm of base of tongue: Secondary | ICD-10-CM

## 2014-09-22 LAB — BASIC METABOLIC PANEL
Anion gap: 9 (ref 5–15)
BUN: 23 mg/dL — AB (ref 6–20)
CO2: 26 mmol/L (ref 22–32)
Calcium: 9.2 mg/dL (ref 8.9–10.3)
Chloride: 102 mmol/L (ref 101–111)
Creatinine, Ser: 1.5 mg/dL — ABNORMAL HIGH (ref 0.61–1.24)
GFR calc Af Amer: 55 mL/min — ABNORMAL LOW (ref >60)
GFR calc non Af Amer: 47 mL/min — ABNORMAL LOW (ref >60)
GLUCOSE: 115 mg/dL — AB (ref 65–99)
Potassium: 4.6 mmol/L (ref 3.5–5.1)
Sodium: 137 mmol/L (ref 135–145)

## 2014-09-22 LAB — CBC WITH DIFFERENTIAL/PLATELET
BASOS PCT: 0 % (ref 0–1)
Basophils Absolute: 0 10*3/uL (ref 0.0–0.1)
Eosinophils Absolute: 0.1 10*3/uL (ref 0.0–0.7)
Eosinophils Relative: 3 % (ref 0–5)
HCT: 37.4 % — ABNORMAL LOW (ref 39.0–52.0)
Hemoglobin: 12.5 g/dL — ABNORMAL LOW (ref 13.0–17.0)
Lymphocytes Relative: 13 % (ref 12–46)
Lymphs Abs: 0.6 10*3/uL — ABNORMAL LOW (ref 0.7–4.0)
MCH: 30.6 pg (ref 26.0–34.0)
MCHC: 33.4 g/dL (ref 30.0–36.0)
MCV: 91.4 fL (ref 78.0–100.0)
MONO ABS: 0.6 10*3/uL (ref 0.1–1.0)
Monocytes Relative: 12 % (ref 3–12)
NEUTROS PCT: 72 % (ref 43–77)
Neutro Abs: 3.5 10*3/uL (ref 1.7–7.7)
Platelets: 298 10*3/uL (ref 150–400)
RBC: 4.09 MIL/uL — ABNORMAL LOW (ref 4.22–5.81)
RDW: 13.2 % (ref 11.5–15.5)
WBC: 4.8 10*3/uL (ref 4.0–10.5)

## 2014-09-22 LAB — MAGNESIUM: MAGNESIUM: 2.2 mg/dL (ref 1.7–2.4)

## 2014-09-22 MED ORDER — PALONOSETRON HCL INJECTION 0.25 MG/5ML
0.2500 mg | Freq: Once | INTRAVENOUS | Status: AC
  Administered 2014-09-22: 0.25 mg via INTRAVENOUS
  Filled 2014-09-22: qty 5

## 2014-09-22 MED ORDER — POTASSIUM CHLORIDE 2 MEQ/ML IV SOLN
Freq: Once | INTRAVENOUS | Status: AC
  Administered 2014-09-22: 10:00:00 via INTRAVENOUS
  Filled 2014-09-22: qty 10

## 2014-09-22 MED ORDER — SODIUM CHLORIDE 0.9 % IV SOLN
Freq: Once | INTRAVENOUS | Status: AC
  Administered 2014-09-22: 13:00:00 via INTRAVENOUS
  Filled 2014-09-22: qty 5

## 2014-09-22 MED ORDER — MEGESTROL ACETATE 400 MG/10ML PO SUSP: 400.0000 mg | Freq: Every day | ORAL | Status: DC

## 2014-09-22 MED ORDER — MORPHINE SULFATE ER 30 MG PO TBCR: 30.0000 mg | EXTENDED_RELEASE_TABLET | Freq: Two times a day (BID) | ORAL | Status: DC

## 2014-09-22 MED ORDER — HEPARIN SOD (PORK) LOCK FLUSH 100 UNIT/ML IV SOLN
INTRAVENOUS | Status: AC
  Filled 2014-09-22: qty 5

## 2014-09-22 MED ORDER — SODIUM CHLORIDE 0.9 % IV SOLN
100.0000 mg/m2 | Freq: Once | INTRAVENOUS | Status: AC
  Administered 2014-09-22: 192 mg via INTRAVENOUS
  Filled 2014-09-22: qty 192

## 2014-09-22 MED ORDER — JEVITY 1.2 CAL/FIBER PO LIQD: ORAL | Status: DC

## 2014-09-22 MED ORDER — HEPARIN SOD (PORK) LOCK FLUSH 100 UNIT/ML IV SOLN
500.0000 [IU] | Freq: Once | INTRAVENOUS | Status: AC | PRN
  Administered 2014-09-22: 500 [IU]

## 2014-09-22 MED ORDER — SODIUM CHLORIDE 0.9 % IJ SOLN
10.0000 mL | INTRAMUSCULAR | Status: DC | PRN
  Administered 2014-09-22: 10 mL
  Filled 2014-09-22: qty 10

## 2014-09-22 NOTE — Patient Instructions (Signed)
[facility] Discharge Instructions for Patients Receiving Chemotherapy  Today you received the following chemotherapy agents cisplatin Return tomorrow for fluids Follow up as scheduled Please call the clinic if you have any questions or concerns  To help prevent nausea and vomiting after your treatment, we encourage you to take your nausea medication    If you develop nausea and vomiting, or diarrhea that is not controlled by your medication, call the clinic.  The clinic phone number is [phone]. Office hours are Monday-Friday 8:30am-5:00pm.  BELOW ARE SYMPTOMS THAT SHOULD BE REPORTED IMMEDIATELY:  *FEVER GREATER THAN 101.0 F  *CHILLS WITH OR WITHOUT FEVER  NAUSEA AND VOMITING THAT IS NOT CONTROLLED WITH YOUR NAUSEA MEDICATION  *UNUSUAL SHORTNESS OF BREATH  *UNUSUAL BRUISING OR BLEEDING  TENDERNESS IN MOUTH AND THROAT WITH OR WITHOUT PRESENCE OF ULCERS  *URINARY PROBLEMS  *BOWEL PROBLEMS  UNUSUAL RASH Items with * indicate a potential emergency and should be followed up as soon as possible. If you have an emergency after office hours please contact your primary care physician or go to the nearest emergency department.  Please call the clinic during office hours if you have any questions or concerns.   You may also contact the Patient Navigator at [phone] should you have any questions or need assistance in obtaining follow up care. _____________________________________________________________________ Have you asked about our STAR program?    STAR stands for Survivorship Training and Rehabilitation, and this is a nationally recognized cancer care program that focuses on survivorship and rehabilitation.  Cancer and cancer treatments may cause problems, such as, pain, making you feel tired and keeping you from doing the things that you need or want to do. Cancer rehabilitation can help. Our goal is to reduce these troubling effects and help you have the  best quality of life possible.  You may receive a survey from a nurse that asks questions about your current state of health.  Based on the survey results, all eligible patients will be referred to the [facility] program for an evaluation so we can better serve you! A frequently asked questions sheet is available upon request.

## 2014-09-22 NOTE — Patient Instructions (Signed)
[place] at [facility] Discharge Instructions  RECOMMENDATIONS MADE BY THE CONSULTANT AND ANY TEST RESULTS WILL BE SENT TO YOUR REFERRING PHYSICIAN.  Chemo as planned today. Return for IV fluids tomorrow. Neulasta injection on Thursday. Return as scheduled for follow up.  Thank you for choosing [place] at [facility] to provide your oncology and hematology care.  To afford each patient quality time with our provider, please arrive at least 15 minutes before your scheduled appointment time.    You need to re-schedule your appointment should you arrive 10 or more minutes late.  We strive to give you quality time with our providers, and arriving late affects you and other patients whose appointments are after yours.  Also, if you no show three or more times for appointments you may be dismissed from the clinic at the providers discretion.     Again, thank you for choosing [facility].  Our hope is that these requests will decrease the amount of time that you wait before being seen by our physicians.       _____________________________________________________________  Should you have questions after your visit to [facility], please contact our office at [phone] between the hours of 8:30 a.m. and 4:30 p.m.  Voicemails left after 4:30 p.m. will not be returned until the following business day.  For prescription refill requests, have your pharmacy contact our office.

## 2014-09-22 NOTE — Progress Notes (Signed)
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  Oropharyngeal cancer   Staging form: Pharynx - Oropharynx, AJCC 7th Edition     Clinical: Stage IVA (T4a, N2b, M0) - Unsigned    CURRENT THERAPY:  Cisplatin and concurrent XRT  INTERVAL HISTORY: Alexander [name] 64 y.o. 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   The patient is alone today and says he is overall doing well.  He does express that he has stomach pains that come and go and he sometimes feels numbness in his throat. The patient denies depression, says he is sleeping, and his mood is good. He says that his appetite is better.  He has been eating vegetables such as potatoes and green beans along with fluids such as chicken broth; he does not feel as if he needs to meet with a nutritionist again. The patient flushes his feeding tube regularly and uses boost twice a day through it. He drinks one to two cans of boost daily by mouth.  He meets with Dr. Isidore Moos once a week and says that says that aside from his weight loss, she feels he is doing well.  The patient describes his pain as "a 4 or 5  that comes and goes".  He has increased his dosage of Percocet and takes 6-7/ day.  He is able to swallow his tablets. He continues to take Miralax and denies constipation. He has not been smoking.     Oropharyngeal cancer   07/27/2014 Imaging CT neck- Advanced stage oropharyngeal cancer with necrotic adenopathy accounting for the left neck swelling.   07/28/2014 Initial Diagnosis Oropharyngeal cancer   08/03/2014 Imaging CT CAP- L supraclavicular lymphadenopathy is not completely visualized. This is better seen on the previous neck CT from 07/27/2014. Otherwise, no evidence for metastatic disease in the chest, abdomen, or pelvis.   08/03/2014 Imaging Bone scan- Uptake at adjacent anterior LEFT 6, 7, 8 ribs likely representing  trauma/fractures. Questionable nonspecific increased tracer localization at the posterior RIGHT 8th and 9th ribs, the adjacent nature which raises a a question of trauma as well   08/06/2014 Pathology Results Dr. Benjamine Mola- Oropharynx, biopsy, Left - INVASIVE SQUAMOUS CELL CARCINOMA.   08/12/2014 Procedure Dr. Enrique Sack- 1. Multiple extraction of tooth numbers 6, 17, 22, 23, 24, 25, 26, and 27. 3 Quadrants of alveoloplasty     Past Medical History  Diagnosis Date  . Oropharyngeal cancer 07/28/2014    dx. 3 weeks ago.- Dr. Oneal Deputy [facility] [place], [place].  . 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     has No Known Allergies.  Current Outpatient Prescriptions on File Prior to Visit  Medication Sig Dispense Refill  . CISPLATIN IV Inject into the vein every 21 ( twenty-one) days. To begin with radiation    . lidocaine-prilocaine (EMLA) cream Apply a quarter size amount to port site 1 hour prior to chemo. Do not rub in. Cover with plastic wrap. 30 g 2  . ondansetron (ZOFRAN) 8 MG tablet Take 1 tablet every 8 hours as needed for nausea/vomiting. 30 tablet 2  . oxyCODONE-acetaminophen (PERCOCET) 5-325 MG per tablet Take one or two tablets by mouth every 6 hours as  needed for pain. 100 tablet 0  . Pegfilgrastim (NEULASTA Reed) Inject into the skin every 21 ( twenty-one) days. To be given 24 hours after the completion of chemo    . polyethylene glycol (MIRALAX / GLYCOLAX) packet Take 17 g by mouth daily.    . prochlorperazine (COMPAZINE) 10 MG tablet Take 1 tablet (10 mg total) by mouth every 6 (six) hours as needed (Nausea or vomiting). 30 tablet 2   No current facility-administered medications on file prior to visit.    Past Surgical History  Procedure Laterality Date  . Panendoscopy N/A 08/06/2014    Procedure: PANENDOSCOPY  WITH BIOPSY;  Surgeon: Leta Baptist, MD;  Location: [place];  Service: ENT;  Laterality: N/A;  . Multiple extractions with alveoloplasty N/A 08/12/2014    Procedure: Extraction of tooth #'s 6,17,22,23,24,25,26,27 with alveoloplasty;  Surgeon: Lenn Cal, DDS;  Location: [facility];  Service: Oral Surgery;  Laterality: N/A;  . Peg placement Left 08/17/14  . Portacath placement Right 08/17/14  . Portacath placement Right 08/17/2014    Procedure: INSERTION PORT-A-CATH (procedure #2);  Surgeon: Aviva Signs Md, MD;  Location: [facility];  Service: General;  Laterality: Right;  . Peg placement N/A 08/17/2014    Procedure: PERCUTANEOUS ENDOSCOPIC GASTROSTOMY (PEG) PLACEMENT (procedure #1);  Surgeon: Aviva Signs Md, MD;  Location: [facility];  Service: General;  Laterality: N/A;  . Esophagogastroduodenoscopy (egd) with propofol N/A 08/17/2014    Procedure: ESOPHAGOGASTRODUODENOSCOPY (EGD) WITH PROPOFOL (procedure #1);  Surgeon: Aviva Signs Md, MD;  Location: [facility];  Service: General;  Laterality: N/A;    Denies any headaches, dizziness, double vision, fevers, chills, night sweats, nausea, vomiting, diarrhea, constipation, chest pain, heart palpitations, shortness of breath, blood in stool, black tarry stool, urinary pain, urinary burning, urinary frequency, hematuria. 14 point review of systems was performed and is negative except as detailed under history of present illness and above  PHYSICAL EXAMINATION  ECOG PERFORMANCE STATUS: 0 - Asymptomatic  Filed Vitals:   09/22/14 1003  BP: 116/80  Pulse: 59  Temp: 98.2 F (36.8 C)  Resp: 18    GENERAL:alert, no distress, well nourished, well developed, comfortable, cooperative, smiling  SKIN: skin color, texture, turgor are normal, no rashes or significant lesions HEAD: Normocephalic EYES: normal, EOMI, Conjunctiva are pink and non-injected EARS: External ears normal OROPHARYNX:lips, buccal mucosa, and mucous membranes are moist Significant  oral mucositis noted NECK: Left neck mass is remarkably smaller approx 4 cm. Mobile.  LYMPH:  As above BREAST:not examined LUNGS: Clear to auscultation HEART: Regular rate and rhythm ABDOMEN:abdomen soft and normal bowel sounds, FT is C/D/I BACK: Back symmetric, no curvature. EXTREMITIES:less then 2 second capillary refill, no joint deformities, effusion, or inflammation, no skin discoloration, no cyanosis  NEURO: alert & oriented x 3 with fluent speech, no focal motor/sensory deficits, gait normal   LABORATORY DATA: CBC    Component Value Date/Time   WBC 4.8 09/22/2014 1000   RBC 4.09* 09/22/2014 1000   HGB 12.5* 09/22/2014 1000   HCT 37.4* 09/22/2014 1000   PLT 298 09/22/2014 1000   MCV 91.4 09/22/2014 1000   MCH 30.6 09/22/2014 1000   MCHC 33.4 09/22/2014 1000   RDW 13.2 09/22/2014 1000   LYMPHSABS 0.6* 09/22/2014 1000   MONOABS 0.6 09/22/2014 1000   EOSABS 0.1 09/22/2014 1000   BASOSABS 0.0 09/22/2014 1000      Chemistry      Component Value Date/Time   NA 137 09/22/2014 1000   K 4.6  09/22/2014 1000   CL 102 09/22/2014 1000   CO2 26 09/22/2014 1000   BUN 23* 09/22/2014 1000   CREATININE 1.50* 09/22/2014 1000      Component Value Date/Time   CALCIUM 9.2 09/22/2014 1000       RADIOGRAPHIC STUDIES:  Nm Pet Image Initial (pi) Skull Base To Thigh  08/26/2014   CLINICAL DATA:  Initial treatment strategy for squamous cell carcinoma the base of tongue.  EXAM: NUCLEAR MEDICINE PET SKULL BASE TO THIGH  TECHNIQUE: 7.6 mCi F-18 FDG was injected intravenously. Full-ring PET imaging was performed from the skull base to thigh after the radiotracer. CT data was obtained and used for attenuation correction and anatomic localization.  FASTING BLOOD GLUCOSE:  Value: 95 mg/dl  COMPARISON:  CT 08/03/2014  FINDINGS: NECK  Large hypermetabolic mass centered at the left base of tongue measuring approximately 3 cm with SUV max equal 16.5. Metabolic activity extends across midline to the  right base tongue (image 30).  Intensely hypermetabolic large necrotic lymph node measures 5.8 cm beneath the left sternocleidomastoid muscle. There are multiple hypermetabolic left cervical lymph nodes (level 2 and level 3). The lowest metabolic lymph node is a left supraclavicular lymph node measuring 16 mm short axis (image 51) with SUV max 11.3.  There are no hypermetabolic contralateral right cervical lymph nodes. Metabolic activity is present within the right submandibular gland which is felt to be physiologic  CHEST  No hypermetabolic mediastinal or hilar nodes. No suspicious pulmonary nodules on the CT scan.  ABDOMEN/PELVIS  No abnormal hypermetabolic activity within the liver, pancreas, adrenal glands, or spleen. No hypermetabolic lymph nodes in the abdomen or pelvis. Expected metabolic activity associated the percutaneous gastrostomy tube  There is a focus of intense metabolic activity associated with the right lobe of the prostate gland involving the mid and lateral gland. There is a high-density region within this area on comparison CT scan measuring 20 mm. This activity is intense with SUV max equal 19.  SKELETON  There are multiple foci of abnormal uptake within the left anterior medial lower ribs with healed fractures on CT portion. These felt to be posttraumatic healed fractures.  IMPRESSION: 1. Large hypermetabolic mass in the left base of tongue. Activity extends across midline to the right base tongue. 2. Intensely hypermetabolic left cervical metastatic lymph nodes. Lymph nodes extend from the left level II position to the left supraclavicular neck. 3. No hypermetabolic contralateral lymph nodes in the right neck. 4. No evidence of metastatic disease in the chest, abdomen, or pelvis. 5. Intense metabolic activity focally within the right lobe of the prostate gland with rounded lesion on CT. This is concerning for prostate neoplasm versus less likely atypical infection or inflammation. The intense  metabolic activity is unusual. Consider prostate MRI versus biopsy.   Electronically Signed   By: Suzy Bouchard M.D.   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  Pain secondary to therapy Weight loss Mucositis   THERAPY PLAN:   He is receiving concurrent cisplatin and radiation. We started his cisplatin on June 7 secondary to the significant size of his neck mass. We will see if he is able to tolerate more than 3 cycles of cisplatin. It would be ideal to give him concurrent treatment throughout his radiation. He has definitely had an improvement in the size of his neck mass. Will move on with therapy as planned.  His creatinine is  elevated today and I will set him up for additional hydration. I have encouraged him to increase his oral hydration or push more liquids through his feeding tube. He was given a sheet in regards to his thick oral secretions with ideas on how to handle them.  I have given him long-acting morphine and advised him that goal will be to reduce his Percocet usage. In addition the goal of long-acting pain medication provide better long-term constant pain control. Advised him to continue his MiraLAX and if he develops worsening constipation to let us know immediately.  She will return in one week for repeat physical exam and monitoring of his weight and nutritional intake. I will be rechecking his kidney function as well. We will reassess his pain and tolerance of long acting morphine.  All questions were answered. The patient knows to call the clinic with any problems, questions or concerns. We can certainly see the patient much sooner if necessary. //  This document serves as a record of services personally performed by Ancil Linsey, MD. It was created on her behalf by Janace Hoard, a trained medical scribe. The creation of this record is based on the scribe's personal observations and the provider's  statements to them. This document has been checked and approved by the attending provider.  I have reviewed the above documentation for accuracy and completeness, and I agree with the above.   Kelby Fam. Whitney Muse, MD

## 2014-09-22 NOTE — Progress Notes (Signed)
Following up with pt in light of continued wt loss and initiation of tube feedings  Contacted Pt by visiting him during chemo  Wt Readings from Last 10 Encounters:  09/22/14 146 lb 6.4 oz (66.407 kg)  09/14/14 155 lb (70.308 kg)  09/07/14 150 lb 1.6 oz (68.085 kg)  09/01/14 150 lb 9.6 oz (68.312 kg)  08/26/14 151 lb 12.8 oz (68.856 kg)  08/17/14 155 lb (70.308 kg)  08/17/14 157 lb 8 oz (71.442 kg)  08/12/14 155 lb 6 oz (70.478 kg)  08/10/14 155 lb 6 oz (70.478 kg)  08/06/14 154 lb 9.6 oz (70.126 kg)   Patient weight has decreased by ~10 lbs in a month  Patient reports oral intake as poor to fair and is suffering from symptoms including a very sore throat.   Pt states that his biggest issue right now is the pain he is in when he swallows. He says he doesn't have trouble swallowing; It just hurts to do it  Discussed with pt that it is time to utilize his PEG tube. He was visibly anxious/nervuos. He previously stated how he had hoped not to need to use it at all. I tried to comfort him and tell him that it is in his best interest at the moment  He is still eating and I encouraged him to continue to do so. He mentioned things like soup, ensure, and pinto beans as things he can tolerate. I will account for ~500 kcals for oral diet.   Pt seems to be worried about the change.    Estimated Energy Needs Kcals: 2100-2300 (32-35 kcal/kg) Protein: 86-99 g Pro (1.3-1.5 g/kg) Fluid: >2 liters  Starting TF regimen: 6 cans of Jevity 1.2 split into 3 feeds. Flush with 100 mls before and after each feed Kcals: 1706 kcals Protein: 79 g Pro Fluid: 1150 ml fluid + 600 mls from flushes  Left Case of Ensure and handouts titled  "Feeding Tube Use and Care (Bolus/Syringe Feedings)"  [name] RD, LDN Nutrition Pager: [phone] 09/22/2014 11:39 AM

## 2014-09-22 NOTE — Progress Notes (Signed)
Alexander [name] Tolerated chemotherapy well today Discharged ambulatory

## 2014-09-23 ENCOUNTER — Encounter (HOSPITAL_COMMUNITY): Payer: Self-pay

## 2014-09-23 ENCOUNTER — Encounter (HOSPITAL_BASED_OUTPATIENT_CLINIC_OR_DEPARTMENT_OTHER): Payer: Medicaid Other

## 2014-09-23 ENCOUNTER — Encounter: Payer: Self-pay | Admitting: *Deleted

## 2014-09-23 VITALS — BP 123/61 | HR 71 | Temp 98.5°F | Resp 16

## 2014-09-23 DIAGNOSIS — C109 Malignant neoplasm of oropharynx, unspecified: Secondary | ICD-10-CM

## 2014-09-23 DIAGNOSIS — R944 Abnormal results of kidney function studies: Secondary | ICD-10-CM | POA: Diagnosis not present

## 2014-09-23 DIAGNOSIS — Z5189 Encounter for other specified aftercare: Secondary | ICD-10-CM | POA: Diagnosis not present

## 2014-09-23 MED ORDER — HEPARIN SOD (PORK) LOCK FLUSH 100 UNIT/ML IV SOLN
INTRAVENOUS | Status: AC
  Filled 2014-09-23: qty 5

## 2014-09-23 MED ORDER — PEGFILGRASTIM INJECTION 6 MG/0.6ML ~~LOC~~
6.0000 mg | PREFILLED_SYRINGE | Freq: Once | SUBCUTANEOUS | Status: AC
  Administered 2014-09-23: 6 mg via SUBCUTANEOUS

## 2014-09-23 MED ORDER — HEPARIN SOD (PORK) LOCK FLUSH 100 UNIT/ML IV SOLN
500.0000 [IU] | Freq: Once | INTRAVENOUS | Status: AC
  Administered 2014-09-23: 500 [IU] via INTRAVENOUS

## 2014-09-23 MED ORDER — SODIUM CHLORIDE 0.9 % IV SOLN
INTRAVENOUS | Status: DC
  Administered 2014-09-23: 10:00:00 via INTRAVENOUS

## 2014-09-23 MED ORDER — PEGFILGRASTIM INJECTION 6 MG/0.6ML ~~LOC~~
PREFILLED_SYRINGE | SUBCUTANEOUS | Status: AC
  Filled 2014-09-23: qty 0.6

## 2014-09-23 NOTE — Patient Instructions (Signed)
[place] at [facility] Discharge Instructions  RECOMMENDATIONS MADE BY THE CONSULTANT AND ANY TEST RESULTS WILL BE SENT TO YOUR REFERRING PHYSICIAN.  IV fluids today neulasta injection today Please follow up as scheduled Call the clinic if you have any questions or concerns  Thank you for choosing [place] at [facility] to provide your oncology and hematology care.  To afford each patient quality time with our provider, please arrive at least 15 minutes before your scheduled appointment time.    You need to re-schedule your appointment should you arrive 10 or more minutes late.  We strive to give you quality time with our providers, and arriving late affects you and other patients whose appointments are after yours.  Also, if you no show three or more times for appointments you may be dismissed from the clinic at the providers discretion.     Again, thank you for choosing [facility].  Our hope is that these requests will decrease the amount of time that you wait before being seen by our physicians.       _____________________________________________________________  Should you have questions after your visit to [facility], please contact our office at [phone] between the hours of 8:30 a.m. and 4:30 p.m.  Voicemails left after 4:30 p.m. will not be returned until the following business day.  For prescription refill requests, have your pharmacy contact our office.

## 2014-09-23 NOTE — Progress Notes (Signed)
[facility] Clinical Social Work  Clinical Social Work was referred by CSW rounding for re-assessment of needs. Clinical Social Worker met with patient at [facility] to offer support and assess for needs. No new needs identified. CSW encouraged pt to attend Cancer Support Group next month. He will consider.   Clinical Social Work interventions: Reassess needs Resource education   Grier Oluwaseun Cremer, LCSW [facility] Tuesdays 8:30-1pm Wednesdays 8:30-12pm  [phone]           

## 2014-09-23 NOTE — Progress Notes (Signed)
Alexander [name] Tolerated fluids well Discharged ambulatory   Alexander [name] reason for visit today is for an injection as scheduled per MD orders. Alexander [name] also received neualsta sq in abdomen per MD orders; see [facility] for administration details.  Alexander [name] tolerated all procedures well and without incident; questions were answered and patient was discharged.

## 2014-09-24 ENCOUNTER — Inpatient Hospital Stay (HOSPITAL_COMMUNITY): Payer: Self-pay

## 2014-09-24 ENCOUNTER — Ambulatory Visit (HOSPITAL_COMMUNITY): Payer: Self-pay | Admitting: Hematology & Oncology

## 2014-09-24 ENCOUNTER — Ambulatory Visit (HOSPITAL_COMMUNITY): Payer: Self-pay

## 2014-09-25 ENCOUNTER — Ambulatory Visit (HOSPITAL_COMMUNITY): Payer: Self-pay

## 2014-09-30 ENCOUNTER — Encounter (HOSPITAL_COMMUNITY): Payer: Medicaid Other | Attending: Oncology | Admitting: Oncology

## 2014-09-30 ENCOUNTER — Other Ambulatory Visit (HOSPITAL_COMMUNITY): Payer: Self-pay | Admitting: Oncology

## 2014-09-30 VITALS — BP 90/53 | HR 64 | Temp 99.4°F | Resp 16 | Wt 145.8 lb

## 2014-09-30 DIAGNOSIS — E860 Dehydration: Secondary | ICD-10-CM

## 2014-09-30 DIAGNOSIS — B370 Candidal stomatitis: Secondary | ICD-10-CM | POA: Diagnosis not present

## 2014-09-30 DIAGNOSIS — C109 Malignant neoplasm of oropharynx, unspecified: Secondary | ICD-10-CM | POA: Diagnosis present

## 2014-09-30 DIAGNOSIS — R52 Pain, unspecified: Secondary | ICD-10-CM

## 2014-09-30 DIAGNOSIS — Z79899 Other long term (current) drug therapy: Secondary | ICD-10-CM | POA: Insufficient documentation

## 2014-09-30 LAB — CBC
HCT: 36.1 % — ABNORMAL LOW (ref 39.0–52.0)
HEMOGLOBIN: 11.7 g/dL — AB (ref 13.0–17.0)
MCH: 30.2 pg (ref 26.0–34.0)
MCHC: 32.4 g/dL (ref 30.0–36.0)
MCV: 93.3 fL (ref 78.0–100.0)
PLATELETS: 156 10*3/uL (ref 150–400)
RBC: 3.87 MIL/uL — AB (ref 4.22–5.81)
RDW: 13.8 % (ref 11.5–15.5)
WBC: 10.6 10*3/uL — AB (ref 4.0–10.5)

## 2014-09-30 LAB — BASIC METABOLIC PANEL
ANION GAP: 10 (ref 5–15)
BUN: 50 mg/dL — AB (ref 6–20)
CHLORIDE: 105 mmol/L (ref 101–111)
CO2: 27 mmol/L (ref 22–32)
CREATININE: 2.27 mg/dL — AB (ref 0.61–1.24)
Calcium: 8.9 mg/dL (ref 8.9–10.3)
GFR calc Af Amer: 33 mL/min — ABNORMAL LOW (ref >60)
GFR calc non Af Amer: 29 mL/min — ABNORMAL LOW (ref >60)
Glucose, Bld: 128 mg/dL — ABNORMAL HIGH (ref 65–99)
POTASSIUM: 4.4 mmol/L (ref 3.5–5.1)
Sodium: 142 mmol/L (ref 135–145)

## 2014-09-30 MED ORDER — FIRST-DUKES MOUTHWASH MT SUSP: 5.0000 mL | Freq: Four times a day (QID) | OROMUCOSAL | Status: DC | PRN

## 2014-09-30 NOTE — Progress Notes (Signed)
No PCP Per Patient No address on file  Oropharyngeal cancer - Plan: CBC, Basic metabolic panel, CBC, Basic metabolic panel  Oral candidiasis - Plan: Diphenhyd-Hydrocort-Nystatin (FIRST-DUKES MOUTHWASH) SUSP  CURRENT THERAPY: Cisplatin and concurrent XRT  INTERVAL HISTORY: Alexander [name] 64 y.o. 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     Oropharyngeal cancer   07/27/2014 Imaging CT neck- Advanced stage oropharyngeal cancer with necrotic adenopathy accounting for the left neck swelling.   07/28/2014 Initial Diagnosis Oropharyngeal cancer   08/03/2014 Imaging CT CAP- L supraclavicular lymphadenopathy is not completely visualized. This is better seen on the previous neck CT from 07/27/2014. Otherwise, no evidence for metastatic disease in the chest, abdomen, or pelvis.   08/03/2014 Imaging Bone scan- Uptake at adjacent anterior LEFT 6, 7, 8 ribs likely representing trauma/fractures. Questionable nonspecific increased tracer localization at the posterior RIGHT 8th and 9th ribs, the adjacent nature which raises a a question of trauma as well   08/06/2014 Pathology Results Dr. Benjamine Mola- Oropharynx, biopsy, Left - INVASIVE SQUAMOUS CELL CARCINOMA.   08/12/2014 Procedure Dr. Enrique Sack- 1. Multiple extraction of tooth numbers 6, 17, 22, 23, 24, 25, 26, and 27. 3 Quadrants of alveoloplasty   09/01/2014 -  Chemotherapy Cisplatin/XRT     I personally reviewed and went over laboratory results with the patient.  The results are noted within this dictation.  We will update labs today.  He reports that he has 16 more XRT left.  He notes that he is doing well.  He reports that he is consuming plenty of H2O.  He reports that his pain is much improved with the addition of a long-acting pain medication.  He notes that his bowel are moving.  His weight has stabilized over the past week, but we will need to monitor closely moving forward as he nears the completion  of treatment.   Past Medical History  Diagnosis Date  . Oropharyngeal cancer 07/28/2014    dx. 3 weeks ago.- Dr. Oneal [facility] [place], [place].  . 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     has No Known Allergies.  Current Outpatient Prescriptions on File Prior to Visit  Medication Sig Dispense Refill  . CISPLATIN IV Inject into the vein every 21 ( twenty-one) days. To begin with radiation    . lidocaine-prilocaine (EMLA) cream Apply a quarter size amount to port site 1 hour prior to chemo. Do not rub in. Cover with plastic wrap. 30 g 2  . megestrol (MEGACE) 400 MG/10ML suspension Place 10 mLs (400 mg total) into feeding tube daily. 240 mL 0  . morphine (MS CONTIN) 30 MG 12 hr tablet Take 1 tablet (30 mg total) by mouth every 12 (twelve) hours. 60 tablet 0  . Nutritional Supplements (JEVITY 1.2 CAL/FIBER) LIQD Jevity 1.2, 6 cans (1422 mls) split into 3 feeds. Flush with 100 mls water before and after each feed. Provides: 1706 kcals,  79 g Pro, 1750 mls fluid 1422 mL   . ondansetron (ZOFRAN) 8 MG tablet Take 1 tablet every 8 hours as needed for nausea/vomiting. 30 tablet 2  . oxyCODONE-acetaminophen (PERCOCET) 5-325 MG per tablet Take one or two tablets by mouth every 6 hours as needed for pain. 100 tablet  0  . Pegfilgrastim (NEULASTA [place]) Inject into the skin every 21 ( twenty-one) days. To be given 24 hours after the completion of chemo    . polyethylene glycol (MIRALAX / GLYCOLAX) packet Take 17 g by mouth daily.    . prochlorperazine (COMPAZINE) 10 MG tablet Take 1 tablet (10 mg total) by mouth every 6 (six) hours as needed (Nausea or vomiting). 30 tablet 2   No current facility-administered medications on file prior to visit.    Past Surgical History  Procedure Laterality Date  . Panendoscopy N/A  08/06/2014    Procedure: PANENDOSCOPY WITH BIOPSY;  Surgeon: Leta Baptist, MD;  Location: [facility];  Service: ENT;  Laterality: N/A;  . Multiple extractions with alveoloplasty N/A 08/12/2014    Procedure: Extraction of tooth #'s 6,17,22,23,24,25,26,27 with alveoloplasty;  Surgeon: Lenn Cal, DDS;  Location: [facility];  Service: Oral Surgery;  Laterality: N/A;  . Peg placement Left 08/17/14  . Portacath placement Right 08/17/14  . Portacath placement Right 08/17/2014    Procedure: INSERTION PORT-A-CATH (procedure #2);  Surgeon: Aviva Signs Md, MD;  Location: [facility];  Service: General;  Laterality: Right;  . Peg placement N/A 08/17/2014    Procedure: PERCUTANEOUS ENDOSCOPIC GASTROSTOMY (PEG) PLACEMENT (procedure #1);  Surgeon: Aviva Signs Md, MD;  Location: [facility];  Service: General;  Laterality: N/A;  . Esophagogastroduodenoscopy (egd) with propofol N/A 08/17/2014    Procedure: ESOPHAGOGASTRODUODENOSCOPY (EGD) WITH PROPOFOL (procedure #1);  Surgeon: Aviva Signs Md, MD;  Location: [facility];  Service: General;  Laterality: N/A;    Denies any headaches, dizziness, double vision, fevers, chills, night sweats, nausea, vomiting, diarrhea, constipation, chest pain, heart palpitations, shortness of breath, blood in stool, black tarry stool, urinary pain, urinary burning, urinary frequency, hematuria.   PHYSICAL EXAMINATION  ECOG PERFORMANCE STATUS: 1 - Symptomatic but completely ambulatory  Filed Vitals:   09/30/14 1235  BP: 90/53  Pulse: 64  Temp: 99.4 F (37.4 C)  Resp: 16    GENERAL:alert, no distress, cachectic, comfortable, cooperative, smiling and unacommpanied SKIN: skin color, texture, turgor are normal, no rashes or significant lesions HEAD: Normocephalic, No masses, lesions, tenderness or abnormalities EYES: normal, PERRLA, EOMI, Conjunctiva are pink and non-injected EARS: External ears normal OROPHARYNX:thrush  NECK: left neck mass measuring 4 cm in size. BREAST:not  examined LUNGS: clear to auscultation  HEART: regular rate & rhythm, no murmurs and no gallops ABDOMEN:abdomen soft and normal bowel sounds BACK: Back symmetric, no curvature. EXTREMITIES:less then 2 second capillary refill, no joint deformities, effusion, or inflammation, no skin discoloration  NEURO: alert & oriented x 3 with fluent speech, no focal motor/sensory deficits, gait normal  LABORATORY DATA: CBC    Component Value Date/Time   WBC 4.8 09/22/2014 1000   RBC 4.09* 09/22/2014 1000   HGB 12.5* 09/22/2014 1000   HCT 37.4* 09/22/2014 1000   PLT 298 09/22/2014 1000   MCV 91.4 09/22/2014 1000   MCH 30.6 09/22/2014 1000   MCHC 33.4 09/22/2014 1000   RDW 13.2 09/22/2014 1000   LYMPHSABS 0.6* 09/22/2014 1000   MONOABS 0.6 09/22/2014 1000   EOSABS 0.1 09/22/2014 1000   BASOSABS 0.0 09/22/2014 1000      Chemistry      Component Value Date/Time   NA 137 09/22/2014 1000   K 4.6 09/22/2014 1000   CL 102 09/22/2014 1000   CO2 26 09/22/2014 1000   BUN 23* 09/22/2014 1000   CREATININE 1.50* 09/22/2014 1000      Component Value  Date/Time   CALCIUM 9.2 09/22/2014 1000        PENDING LABS:   RADIOGRAPHIC STUDIES:  No results found.   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  Nutrition has been an issue with the start of treatment.  He is being followed by [name], nutritionist.  Weight has stabilized over the past week, but overall, weight is down.  Will continue with close monitoring.  Pain has also been addressed.  He was started on a long-acting narcotic at his last visit.  He notes significant improvement in pain control.  He denies any constipation.  We have been monitoring his renal function closely and he has required additional  hydration support in the recent past.  Labs today: CBC, BMET  Oral candidiasis was noted on exam.  He denies any symptoms.  I have prescribed Magic Mouthwash with lidocaine to [facility].  Return as planned for follow-up.  Depending on renal labs today, we may bring him in tomorrow for IV fluids.    THERAPY PLAN:  Continue with treatment as planned.  All questions were answered. The patient knows to call the clinic with any problems, questions or concerns. We can certainly see the patient much sooner if necessary.  Patient and plan discussed with Dr. Ancil Linsey and she is in agreement with the aforementioned.   This note is electronically signed by: Robynn Pane, PA-C 09/30/2014 1:16 PM

## 2014-09-30 NOTE — Assessment & Plan Note (Addendum)
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  Nutrition has been an issue with the start of treatment.  He is being followed by [name], nutritionist.  Weight has stabilized over the past week, but overall, weight is down.  Will continue with close monitoring.  Pain has also been addressed.  He was started on a long-acting narcotic at his last visit.  He notes significant improvement in pain control.  He denies any constipation.  We have been monitoring his renal function closely and he has required additional hydration support in the recent past.  Labs today: CBC, BMET  Oral candidiasis was noted on exam.  He denies any symptoms.  I have prescribed Magic Mouthwash with lidocaine to [facility].  Return as planned for follow-up.  Depending on renal labs today, we may bring him in tomorrow for IV fluids.

## 2014-09-30 NOTE — Patient Instructions (Signed)
[place] at [facility] Discharge Instructions  RECOMMENDATIONS MADE BY THE CONSULTANT AND ANY TEST RESULTS WILL BE SENT TO YOUR REFERRING PHYSICIAN.  Exam and discussion by Robynn Pane, PA-C Will check labs today Magic Mouthwash prescribed - sent to your pharmacy - use as directed. Report fevers, uncontrolled nausea, vomiting or other concerns. Follow-up as scheduled.  Thank you for choosing [place] at [facility] to provide your oncology and hematology care.  To afford each patient quality time with our provider, please arrive at least 15 minutes before your scheduled appointment time.    You need to re-schedule your appointment should you arrive 10 or more minutes late.  We strive to give you quality time with our providers, and arriving late affects you and other patients whose appointments are after yours.  Also, if you no show three or more times for appointments you may be dismissed from the clinic at the providers discretion.     Again, thank you for choosing [facility].  Our hope is that these requests will decrease the amount of time that you wait before being seen by our physicians.       _____________________________________________________________  Should you have questions after your visit to [facility], please contact our office at [phone] between the hours of 8:30 a.m. and 4:30 p.m.  Voicemails left after 4:30 p.m. will not be returned until the following business day.  For prescription refill requests, have your pharmacy contact our office.

## 2014-09-30 NOTE — Progress Notes (Signed)
Alexander [name] presented for labwork. Labs per MD order drawn via Peripheral Line 23 gauge needle inserted in right AC  Good blood return present. Procedure without incident.  Needle removed intact. Patient tolerated procedure well.

## 2014-10-01 ENCOUNTER — Encounter (HOSPITAL_BASED_OUTPATIENT_CLINIC_OR_DEPARTMENT_OTHER): Payer: Medicaid Other

## 2014-10-01 VITALS — BP 107/53 | HR 59 | Temp 98.5°F | Resp 16

## 2014-10-01 DIAGNOSIS — C109 Malignant neoplasm of oropharynx, unspecified: Secondary | ICD-10-CM | POA: Diagnosis not present

## 2014-10-01 DIAGNOSIS — E860 Dehydration: Secondary | ICD-10-CM

## 2014-10-01 MED ORDER — SODIUM CHLORIDE 0.9 % IV SOLN
INTRAVENOUS | Status: DC
  Administered 2014-10-01: 11:00:00 via INTRAVENOUS

## 2014-10-01 MED ORDER — HEPARIN SOD (PORK) LOCK FLUSH 100 UNIT/ML IV SOLN
500.0000 [IU] | Freq: Once | INTRAVENOUS | Status: AC
  Administered 2014-10-01: 500 [IU] via INTRAVENOUS

## 2014-10-01 MED ORDER — HEPARIN SOD (PORK) LOCK FLUSH 100 UNIT/ML IV SOLN
INTRAVENOUS | Status: AC
  Filled 2014-10-01: qty 5

## 2014-10-01 MED ORDER — SODIUM CHLORIDE 0.9 % IJ SOLN: 10.0000 mL | INTRAMUSCULAR | Status: DC | PRN

## 2014-10-01 NOTE — Progress Notes (Signed)
Patient tolerated infusion well.

## 2014-10-02 ENCOUNTER — Inpatient Hospital Stay (HOSPITAL_COMMUNITY): Payer: Self-pay

## 2014-10-02 ENCOUNTER — Encounter: Payer: Self-pay | Admitting: Dietician

## 2014-10-02 ENCOUNTER — Ambulatory Visit (HOSPITAL_COMMUNITY): Payer: Self-pay | Admitting: Hematology & Oncology

## 2014-10-02 NOTE — Progress Notes (Signed)
Called pt to follow up with him regarding his recently initiated tube feeds.   Contacted Pt by Phone  Wt Readings from Last 10 Encounters:  09/30/14 145 lb 12.8 oz (66.134 kg)  09/22/14 146 lb 6.4 oz (66.407 kg)  09/14/14 155 lb (70.308 kg)  09/07/14 150 lb 1.6 oz (68.085 kg)  09/01/14 150 lb 9.6 oz (68.312 kg)  08/26/14 151 lb 12.8 oz (68.856 kg)  08/17/14 155 lb (70.308 kg)  08/17/14 157 lb 8 oz (71.442 kg)  08/12/14 155 lb 6 oz (70.478 kg)  08/10/14 155 lb 6 oz (70.478 kg)  Patient weight has fallen by 1 lb since I talked to him last  Patient reports that he has been doing the prescribed regimen of 6 cans Jevity 1.2 a day. He stated the advance home care nurse was there today to teach him how to manage his feeds.   As of right now he reports no n/v/d. He says he thinks he may be getting constipated. He reports being compliant with the flushes.   Because his TF only account for about 80% of his needs, it is important that he is still having some oral intake. Patient reports that he is still eating "a little". Some foods he mentioned eating were spinach, soups, and mashed potatoes.    He is also doing Ensure.  Unfortunately I offered him another case having thought he had only gotten two cases of Ensure. He reported he has received only two cases.  In actuality, he has already received 3 cases. Unsure if he forgot or was being untruthful with me.   I will let him know he is at his limit next Appointment  [name] RD, LDN Nutrition Pager: [phone] 10/02/2014 3:36 PM

## 2014-10-06 ENCOUNTER — Other Ambulatory Visit (HOSPITAL_COMMUNITY): Payer: Self-pay | Admitting: Hematology & Oncology

## 2014-10-08 ENCOUNTER — Other Ambulatory Visit (HOSPITAL_COMMUNITY): Payer: Self-pay

## 2014-10-13 ENCOUNTER — Ambulatory Visit (HOSPITAL_COMMUNITY): Payer: Self-pay | Admitting: Oncology

## 2014-10-13 ENCOUNTER — Inpatient Hospital Stay (HOSPITAL_COMMUNITY): Payer: Self-pay

## 2014-10-13 ENCOUNTER — Other Ambulatory Visit (HOSPITAL_COMMUNITY): Payer: Self-pay | Admitting: *Deleted

## 2014-10-13 DIAGNOSIS — C109 Malignant neoplasm of oropharynx, unspecified: Secondary | ICD-10-CM

## 2014-10-13 NOTE — Progress Notes (Signed)
No PCP Per Patient No address on file  Oropharyngeal cancer  CURRENT THERAPY: Cisplatin and concurrent XRT  INTERVAL HISTORY: Alexander [name] 64 y.o. 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     Oropharyngeal cancer   07/27/2014 Imaging CT neck- Advanced stage oropharyngeal cancer with necrotic adenopathy accounting for the left neck swelling.   07/28/2014 Initial Diagnosis Oropharyngeal cancer   08/03/2014 Imaging CT CAP- L supraclavicular lymphadenopathy is not completely visualized. This is better seen on the previous neck CT from 07/27/2014. Otherwise, no evidence for metastatic disease in the chest, abdomen, or pelvis.   08/03/2014 Imaging Bone scan- Uptake at adjacent anterior LEFT 6, 7, 8 ribs likely representing trauma/fractures. Questionable nonspecific increased tracer localization at the posterior RIGHT 8th and 9th ribs, the adjacent nature which raises a a question of trauma as well   08/06/2014 Pathology Results Dr. Benjamine Mola- Oropharynx, biopsy, Left - INVASIVE SQUAMOUS CELL CARCINOMA.   08/12/2014 Procedure Dr. Enrique Sack- 1. Multiple extraction of tooth numbers 6, 17, 22, 23, 24, 25, 26, and 27. 3 Quadrants of alveoloplasty   09/01/2014 -  Chemotherapy Cisplatin/XRT     I personally reviewed and went over laboratory results with the patient.  The results are noted within this dictation.  We will update labs today.  His renal function is worse today.  He reports that he has 8 more XRT left.  He admits that he is not drinking much H2O at home.  He denies any discomfort with swallowing, "nah, my pain is well controlled."  He notes that he gets busy and rests and forgets to drink water.   Past Medical History  Diagnosis Date  . Oropharyngeal cancer 07/28/2014    dx. 3 weeks ago.- Dr. Oneal [facility] [place], [place].  . 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     has No Known Allergies.  Current Outpatient Prescriptions on File Prior to Visit  Medication Sig Dispense Refill  . CISPLATIN IV Inject into the vein every 21 ( twenty-one) days. To begin with radiation    . Diphenhyd-Hydrocort-Nystatin (FIRST-DUKES MOUTHWASH) SUSP Use as directed 5 mLs in the mouth or throat 4 (four) times daily as needed. 300 mL 1  . megestrol (MEGACE) 400 MG/10ML suspension Place 10 mLs (400 mg total) into feeding tube daily. 240 mL 0  . morphine (MS CONTIN) 30 MG 12 hr tablet Take 1 tablet (30 mg total) by mouth every 12 (twelve) hours. 60 tablet 0  . Nutritional Supplements (JEVITY 1.2 CAL/FIBER) LIQD Jevity 1.2, 6 cans (1422 mls) split into 3 feeds. Flush with 100 mls water before and after each feed. Provides: 1706 kcals,  79 g Pro, 1750 mls fluid 1422 mL   . oxyCODONE-acetaminophen (PERCOCET) 5-325 MG per tablet Take one or two tablets by mouth every 6 hours as needed for pain. 100 tablet 0  . Pegfilgrastim (NEULASTA [place]) Inject into the skin every 21 ( twenty-one) days. To be given 24 hours after the completion of chemo    . polyethylene glycol (MIRALAX / GLYCOLAX) packet Take 17 g by mouth daily.     No current facility-administered medications on file prior to visit.    Past Surgical History  Procedure Laterality Date  . Panendoscopy  N/A 08/06/2014    Procedure: PANENDOSCOPY WITH BIOPSY;  Surgeon: Leta Baptist, MD;  Location: [facility];  Service: ENT;  Laterality: N/A;  . Multiple extractions with alveoloplasty N/A 08/12/2014    Procedure: Extraction of tooth #'s 6,17,22,23,24,25,26,27 with alveoloplasty;  Surgeon: Lenn Cal, DDS;  Location: [facility];  Service: Oral Surgery;  Laterality: N/A;  . Peg placement Left 08/17/14  . Portacath placement Right 08/17/14  . Portacath placement Right 08/17/2014    Procedure: INSERTION  PORT-A-CATH (procedure #2);  Surgeon: Aviva Signs Md, MD;  Location: [facility];  Service: General;  Laterality: Right;  . Peg placement N/A 08/17/2014    Procedure: PERCUTANEOUS ENDOSCOPIC GASTROSTOMY (PEG) PLACEMENT (procedure #1);  Surgeon: Aviva Signs Md, MD;  Location: [facility];  Service: General;  Laterality: N/A;  . Esophagogastroduodenoscopy (egd) with propofol N/A 08/17/2014    Procedure: ESOPHAGOGASTRODUODENOSCOPY (EGD) WITH PROPOFOL (procedure #1);  Surgeon: Aviva Signs Md, MD;  Location: [facility];  Service: General;  Laterality: N/A;    Denies any headaches, dizziness, double vision, fevers, chills, night sweats, nausea, vomiting, diarrhea, constipation, chest pain, heart palpitations, shortness of breath, blood in stool, black tarry stool, urinary pain, urinary burning, urinary frequency, hematuria.   PHYSICAL EXAMINATION  ECOG PERFORMANCE STATUS: 1 - Symptomatic but completely ambulatory  There were no vitals filed for this visit.  GENERAL:alert, no distress, cachectic, comfortable, cooperative, smiling and unacommpanied SKIN: skin color, texture, turgor are normal, no rashes or significant lesions HEAD: Normocephalic, No masses, lesions, tenderness or abnormalities EYES: normal, PERRLA, EOMI, Conjunctiva are pink and non-injected EARS: External ears normal OROPHARYNX:thrush  NECK: left neck mass measuring 4 cm in size.  Darkening of skin at radiation sites. BREAST:not examined LUNGS: clear to auscultation  HEART: regular rate & rhythm, no murmurs and no gallops ABDOMEN:abdomen soft and normal bowel sounds BACK: Back symmetric, no curvature. EXTREMITIES:less then 2 second capillary refill, no joint deformities, effusion, or inflammation, no skin discoloration  NEURO: alert & oriented x 3 with fluent speech, no focal motor/sensory deficits, gait normal  LABORATORY DATA: CBC    Component Value Date/Time   WBC 3.0* 10/14/2014 1100   RBC 3.26* 10/14/2014 1100   HGB 10.2*  10/14/2014 1100   HCT 29.4* 10/14/2014 1100   PLT 167 10/14/2014 1100   MCV 90.2 10/14/2014 1100   MCH 31.3 10/14/2014 1100   MCHC 34.7 10/14/2014 1100   RDW 14.3 10/14/2014 1100   LYMPHSABS 0.3* 10/14/2014 1100   MONOABS 0.4 10/14/2014 1100   EOSABS 0.1 10/14/2014 1100   BASOSABS 0.0 10/14/2014 1100      Chemistry      Component Value Date/Time   NA 137 10/14/2014 1100   K 5.2* 10/14/2014 1100   CL 107 10/14/2014 1100   CO2 22 10/14/2014 1100   BUN 49* 10/14/2014 1100   CREATININE 2.29* 10/14/2014 1100      Component Value Date/Time   CALCIUM 9.1 10/14/2014 1100   ALKPHOS 56 10/14/2014 1100   AST 17 10/14/2014 1100   ALT 17 10/14/2014 1100   BILITOT 0.6 10/14/2014 1100        PENDING LABS:   RADIOGRAPHIC STUDIES:  No results found.   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  Today marks his 3rd cycle of Cisplatin which is his  final cycle.  He missed treatment appointment and follow-up appointment yesterday because of radiation therapy increasing to BID treatments to get him caught back up on his radiation treatment schedule as the machine was down for a few days for maintenance. HOWEVER, his renal function has declined.  He admits to poor H2O intake.  Chemotherapy deferred today.  He will get 2 L of fluids today.  He will return tomorrow for repeat labs and possibly chemotherapy depending on lab results.  Labs today: CBC, CMET, Mg  Labs tomorrow: CBC diff, CMET  I think twice weekly fluids are appropriate for Alexander [name].  I will get this scheduled.  Return in 10-14 days for follow-up with twice weekly fluids.    THERAPY PLAN:  As noted above.  All questions were answered. The patient knows to call the clinic with any problems,  questions or concerns. We can certainly see the patient much sooner if necessary.  Patient and plan discussed with Dr. Ancil Linsey and she is in agreement with the aforementioned.   This note is electronically signed by: Doy Mince 10/14/2014 12:50 PM

## 2014-10-13 NOTE — Assessment & Plan Note (Addendum)
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  Today marks his 3rd cycle of Cisplatin which is his final cycle.  He missed treatment appointment and follow-up appointment yesterday because of radiation therapy increasing to BID treatments to get him caught back up on his radiation treatment schedule as the machine was down for a few days for maintenance. HOWEVER, his renal function has declined.  He admits to poor H2O intake.  Chemotherapy deferred today.  He will get 2 L of fluids today.  He will return tomorrow for repeat labs and possibly chemotherapy depending on lab results.  Labs today: CBC, CMET, Mg  Labs tomorrow: CBC diff, CMET  I think twice weekly fluids are appropriate for Alexander [name].  I will get this scheduled.  Return in 10-14 days for follow-up with twice weekly fluids.

## 2014-10-13 NOTE — Progress Notes (Signed)
This encounter was created in error - please disregard.

## 2014-10-13 NOTE — Assessment & Plan Note (Deleted)
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  Nutrition has been an issue with the start of treatment.  He is being followed by [name], nutritionist.  Weight has stabilized over the past week, but overall, weight is down.  Will continue with close monitoring.  We have been monitoring his renal function closely and he has required additional hydration support in the recent past.  Labs today: CBC, CMET, Mg  Return in 10 days for follow-up.

## 2014-10-14 ENCOUNTER — Encounter (HOSPITAL_BASED_OUTPATIENT_CLINIC_OR_DEPARTMENT_OTHER): Payer: Medicaid Other | Admitting: Oncology

## 2014-10-14 ENCOUNTER — Encounter (HOSPITAL_BASED_OUTPATIENT_CLINIC_OR_DEPARTMENT_OTHER): Payer: Medicaid Other

## 2014-10-14 ENCOUNTER — Encounter: Payer: Self-pay | Admitting: Dietician

## 2014-10-14 VITALS — Wt 142.4 lb

## 2014-10-14 VITALS — BP 107/58 | HR 56 | Temp 98.3°F | Resp 18

## 2014-10-14 DIAGNOSIS — C109 Malignant neoplasm of oropharynx, unspecified: Secondary | ICD-10-CM | POA: Diagnosis not present

## 2014-10-14 LAB — CBC WITH DIFFERENTIAL/PLATELET
Basophils Absolute: 0 10*3/uL (ref 0.0–0.1)
Basophils Relative: 0 % (ref 0–1)
EOS ABS: 0.1 10*3/uL (ref 0.0–0.7)
Eosinophils Relative: 2 % (ref 0–5)
HCT: 29.4 % — ABNORMAL LOW (ref 39.0–52.0)
HEMOGLOBIN: 10.2 g/dL — AB (ref 13.0–17.0)
Lymphocytes Relative: 11 % — ABNORMAL LOW (ref 12–46)
Lymphs Abs: 0.3 10*3/uL — ABNORMAL LOW (ref 0.7–4.0)
MCH: 31.3 pg (ref 26.0–34.0)
MCHC: 34.7 g/dL (ref 30.0–36.0)
MCV: 90.2 fL (ref 78.0–100.0)
Monocytes Absolute: 0.4 10*3/uL (ref 0.1–1.0)
Monocytes Relative: 13 % — ABNORMAL HIGH (ref 3–12)
NEUTROS ABS: 2.2 10*3/uL (ref 1.7–7.7)
Neutrophils Relative %: 74 % (ref 43–77)
Platelets: 167 10*3/uL (ref 150–400)
RBC: 3.26 MIL/uL — AB (ref 4.22–5.81)
RDW: 14.3 % (ref 11.5–15.5)
WBC: 3 10*3/uL — AB (ref 4.0–10.5)

## 2014-10-14 LAB — HEPATIC FUNCTION PANEL
ALBUMIN: 3.8 g/dL (ref 3.5–5.0)
ALK PHOS: 56 U/L (ref 38–126)
ALT: 17 U/L (ref 17–63)
AST: 17 U/L (ref 15–41)
BILIRUBIN INDIRECT: 0.5 mg/dL (ref 0.3–0.9)
BILIRUBIN TOTAL: 0.6 mg/dL (ref 0.3–1.2)
Bilirubin, Direct: 0.1 mg/dL (ref 0.1–0.5)
Total Protein: 7 g/dL (ref 6.5–8.1)

## 2014-10-14 LAB — BASIC METABOLIC PANEL
ANION GAP: 8 (ref 5–15)
BUN: 49 mg/dL — ABNORMAL HIGH (ref 6–20)
CHLORIDE: 107 mmol/L (ref 101–111)
CO2: 22 mmol/L (ref 22–32)
CREATININE: 2.29 mg/dL — AB (ref 0.61–1.24)
Calcium: 9.1 mg/dL (ref 8.9–10.3)
GFR, EST AFRICAN AMERICAN: 33 mL/min — AB (ref >60)
GFR, EST NON AFRICAN AMERICAN: 28 mL/min — AB (ref >60)
Glucose, Bld: 89 mg/dL (ref 65–99)
Potassium: 5.2 mmol/L — ABNORMAL HIGH (ref 3.5–5.1)
Sodium: 137 mmol/L (ref 135–145)

## 2014-10-14 LAB — MAGNESIUM: Magnesium: 2.3 mg/dL (ref 1.7–2.4)

## 2014-10-14 MED ORDER — HEPARIN SOD (PORK) LOCK FLUSH 100 UNIT/ML IV SOLN
500.0000 [IU] | Freq: Once | INTRAVENOUS | Status: AC
  Administered 2014-10-14: 500 [IU] via INTRAVENOUS

## 2014-10-14 MED ORDER — SODIUM CHLORIDE 0.9 % IV SOLN
INTRAVENOUS | Status: DC
  Administered 2014-10-14: 14:00:00 via INTRAVENOUS

## 2014-10-14 MED ORDER — JEVITY 1.2 CAL/FIBER PO LIQD: ORAL | Status: DC

## 2014-10-14 MED ORDER — POTASSIUM CHLORIDE 2 MEQ/ML IV SOLN
Freq: Once | INTRAVENOUS | Status: AC
  Administered 2014-10-14: 11:00:00 via INTRAVENOUS
  Filled 2014-10-14: qty 10

## 2014-10-14 MED ORDER — HEPARIN SOD (PORK) LOCK FLUSH 100 UNIT/ML IV SOLN
INTRAVENOUS | Status: AC
  Filled 2014-10-14: qty 5

## 2014-10-14 NOTE — Progress Notes (Signed)
Patient tolerated fluids well.  Reported urinating several times.  VSS.  Understands to return tomorrow for treatment.

## 2014-10-14 NOTE — Patient Instructions (Signed)
[place] at [facility] Discharge Instructions  RECOMMENDATIONS MADE BY THE CONSULTANT AND ANY TEST RESULTS WILL BE SENT TO YOUR REFERRING PHYSICIAN.  Exam and discussion by Robynn Pane, PA-C Will hold chemotherapy today and will give you some fluids today, chemotherapy tomorrow, fluids on Friday and and will then plan on giving you fluids on Tuesdays and Fridays. Call with fevers, uncontrolled nausea, vomiting or other concerns   See schedule.  Thank you for choosing [place] at [facility] to provide your oncology and hematology care.  To afford each patient quality time with our provider, please arrive at least 15 minutes before your scheduled appointment time.    You need to re-schedule your appointment should you arrive 10 or more minutes late.  We strive to give you quality time with our providers, and arriving late affects you and other patients whose appointments are after yours.  Also, if you no show three or more times for appointments you may be dismissed from the clinic at the providers discretion.     Again, thank you for choosing [facility].  Our hope is that these requests will decrease the amount of time that you wait before being seen by our physicians.       _____________________________________________________________  Should you have questions after your visit to [facility], please contact our office at [phone] between the hours of 8:30 a.m. and 4:30 p.m.  Voicemails left after 4:30 p.m. will not be returned until the following business day.  For prescription refill requests, have your pharmacy contact our office.

## 2014-10-14 NOTE — Progress Notes (Signed)
F/u with patient to assess TF tolerance and address weight loss  Contacted Alexander [name] by visiting him during chemo  Wt Readings from Last 10 Encounters:  10/14/14 142 lb 6.4 oz (64.592 kg)  09/30/14 145 lb 12.8 oz (66.134 kg)  09/22/14 146 lb 6.4 oz (66.407 kg)  09/14/14 155 lb (70.308 kg)  09/07/14 150 lb 1.6 oz (68.085 kg)  09/01/14 150 lb 9.6 oz (68.312 kg)  08/26/14 151 lb 12.8 oz (68.856 kg)  08/17/14 155 lb (70.308 kg)  08/17/14 157 lb 8 oz (71.442 kg)  08/12/14 155 lb 6 oz (70.478 kg)   Unfortunately Patient weight has decreased by a few pounds since he was last here a few weeks ago  Patient reports oral intake as almost non existant and is suffering from symptoms including lack of taste and an irritated throat.  Alexander [name] reports that his appetite has declined and now he eats "very little". His current TF regimen accounted for an oral intake of ~500 kcals a day. It sounds like he is no longer eating that much and I told him I would like to increase his TF regimen to 7 cans a day. He was agreeable to this. The free water from the extra TF will also help increase his fluid intake. He has not had any symptoms of TF intolerance  Remarkably he also reports no nausea, vomiting, or diarrhea. The only side effects he acknowledges are a sore throat and loss of taste.   We went over a couple strategies to bring out taste in foods, such as adding lemon, sugar, salt to food. He reports that he has tried adding a lot of salt to food, but it hasnt helped at all. He says that he does eat [employer] and [employer] and he can still taste those. He can also taste soda, but that irritates his throat.   He is coming to the end of his chemo and went over that it would be best to continue his TF even after chemo and untl he is able to start meet >50% of his needs orally  He asked about an Ensure case I had told him I would get for him, but unfortunately I was mistaken and he has already hit his 3 case limit. I  asked if he needed coupons and he said he has some.   Alexander [name] was receptive to what I had to say and seemed to be in a fairly good mood.   Will change TF order: NEW TF regimen: 7 cans of Jevity 1.2 split into 3 feeds of 2-3-2 cans. Flush with 100 mls before and after each feed Kcals: 1991 kcals Protein: 92 g Pro Fluid: 1337 ml fluid + 600 mls from flushes  [name] RD, LDN Nutrition Pager: [phone] 10/14/2014 1:20 PM

## 2014-10-15 ENCOUNTER — Inpatient Hospital Stay (HOSPITAL_COMMUNITY): Payer: Self-pay

## 2014-10-15 ENCOUNTER — Ambulatory Visit (HOSPITAL_COMMUNITY): Payer: Self-pay

## 2014-10-15 ENCOUNTER — Encounter (HOSPITAL_BASED_OUTPATIENT_CLINIC_OR_DEPARTMENT_OTHER): Payer: Medicaid Other

## 2014-10-15 ENCOUNTER — Ambulatory Visit (HOSPITAL_COMMUNITY): Payer: Self-pay | Admitting: Oncology

## 2014-10-15 DIAGNOSIS — C109 Malignant neoplasm of oropharynx, unspecified: Secondary | ICD-10-CM | POA: Diagnosis not present

## 2014-10-15 DIAGNOSIS — E875 Hyperkalemia: Secondary | ICD-10-CM

## 2014-10-15 LAB — COMPREHENSIVE METABOLIC PANEL
ALBUMIN: 3.7 g/dL (ref 3.5–5.0)
ALT: 16 U/L — ABNORMAL LOW (ref 17–63)
ANION GAP: 8 (ref 5–15)
AST: 15 U/L (ref 15–41)
Alkaline Phosphatase: 51 U/L (ref 38–126)
BILIRUBIN TOTAL: 0.8 mg/dL (ref 0.3–1.2)
BUN: 38 mg/dL — ABNORMAL HIGH (ref 6–20)
CO2: 21 mmol/L — ABNORMAL LOW (ref 22–32)
Calcium: 9.1 mg/dL (ref 8.9–10.3)
Chloride: 108 mmol/L (ref 101–111)
Creatinine, Ser: 2.08 mg/dL — ABNORMAL HIGH (ref 0.61–1.24)
GFR calc Af Amer: 37 mL/min — ABNORMAL LOW (ref >60)
GFR, EST NON AFRICAN AMERICAN: 32 mL/min — AB (ref >60)
Glucose, Bld: 86 mg/dL (ref 65–99)
POTASSIUM: 5.4 mmol/L — AB (ref 3.5–5.1)
SODIUM: 137 mmol/L (ref 135–145)
Total Protein: 6.7 g/dL (ref 6.5–8.1)

## 2014-10-15 MED ORDER — SODIUM POLYSTYRENE SULFONATE PO POWD: Freq: Once | ORAL | Status: DC

## 2014-10-15 MED ORDER — POTASSIUM CHLORIDE 2 MEQ/ML IV SOLN
Freq: Once | INTRAVENOUS | Status: AC
  Administered 2014-10-15: 10:00:00 via INTRAVENOUS
  Filled 2014-10-15: qty 10

## 2014-10-15 MED ORDER — HEPARIN SOD (PORK) LOCK FLUSH 100 UNIT/ML IV SOLN
500.0000 [IU] | Freq: Once | INTRAVENOUS | Status: AC
  Administered 2014-10-15: 500 [IU] via INTRAVENOUS

## 2014-10-15 MED ORDER — HEPARIN SOD (PORK) LOCK FLUSH 100 UNIT/ML IV SOLN
INTRAVENOUS | Status: AC
  Filled 2014-10-15: qty 5

## 2014-10-15 MED ORDER — SODIUM CHLORIDE 0.9 % IV SOLN
Freq: Once | INTRAVENOUS | Status: AC
  Administered 2014-10-15: 11:00:00 via INTRAVENOUS

## 2014-10-15 NOTE — Progress Notes (Signed)
Patient tolerated fluids well.  He understands the reasoning for holding his treatment today and that we will hydrate him tomorrow and he will return the first of next week to recheck his lab work, and possible treatment at that time.  He was educated today on the importance of hydrating for his kidney function.  I also instructed him to go to the pharmacy to pick up kayexelate as ordered for his potassium level.  He verbalized understanding to all instructions.

## 2014-10-16 ENCOUNTER — Encounter (HOSPITAL_BASED_OUTPATIENT_CLINIC_OR_DEPARTMENT_OTHER): Payer: Medicaid Other

## 2014-10-16 ENCOUNTER — Ambulatory Visit (HOSPITAL_COMMUNITY): Payer: Self-pay

## 2014-10-16 VITALS — BP 105/56 | HR 57 | Temp 99.0°F | Resp 18

## 2014-10-16 DIAGNOSIS — C109 Malignant neoplasm of oropharynx, unspecified: Secondary | ICD-10-CM | POA: Diagnosis not present

## 2014-10-16 DIAGNOSIS — Z95828 Presence of other vascular implants and grafts: Secondary | ICD-10-CM

## 2014-10-16 MED ORDER — HEPARIN SOD (PORK) LOCK FLUSH 100 UNIT/ML IV SOLN
INTRAVENOUS | Status: AC
  Filled 2014-10-16: qty 5

## 2014-10-16 MED ORDER — HEPARIN SOD (PORK) LOCK FLUSH 100 UNIT/ML IV SOLN
500.0000 [IU] | Freq: Once | INTRAVENOUS | Status: AC
  Administered 2014-10-16: 500 [IU] via INTRAVENOUS

## 2014-10-16 MED ORDER — SODIUM CHLORIDE 0.9 % IV SOLN
INTRAVENOUS | Status: DC
  Administered 2014-10-16: 12:00:00 via INTRAVENOUS

## 2014-10-16 NOTE — Patient Instructions (Signed)
[place] at [facility] Discharge Instructions  RECOMMENDATIONS MADE BY THE CONSULTANT AND ANY TEST RESULTS WILL BE SENT TO YOUR REFERRING PHYSICIAN.  1 liter normal saline given intravenously today as ordered. Continue to push drinking fluids at home. Return as scheduled.  Thank you for choosing [place] at [facility] to provide your oncology and hematology care.  To afford each patient quality time with our provider, please arrive at least 15 minutes before your scheduled appointment time.    You need to re-schedule your appointment should you arrive 10 or more minutes late.  We strive to give you quality time with our providers, and arriving late affects you and other patients whose appointments are after yours.  Also, if you no show three or more times for appointments you may be dismissed from the clinic at the providers discretion.     Again, thank you for choosing [facility].  Our hope is that these requests will decrease the amount of time that you wait before being seen by our physicians.       _____________________________________________________________  Should you have questions after your visit to [facility], please contact our office at [phone] between the hours of 8:30 a.m. and 4:30 p.m.  Voicemails left after 4:30 p.m. will not be returned until the following business day.  For prescription refill requests, have your pharmacy contact our office.

## 2014-10-16 NOTE — Progress Notes (Signed)
Tolerated IV fluids well. Ambulatory on discharge.

## 2014-10-19 ENCOUNTER — Encounter (HOSPITAL_COMMUNITY): Payer: Self-pay

## 2014-10-19 ENCOUNTER — Encounter (HOSPITAL_BASED_OUTPATIENT_CLINIC_OR_DEPARTMENT_OTHER): Payer: Medicaid Other

## 2014-10-19 DIAGNOSIS — E860 Dehydration: Secondary | ICD-10-CM

## 2014-10-19 DIAGNOSIS — C109 Malignant neoplasm of oropharynx, unspecified: Secondary | ICD-10-CM

## 2014-10-19 LAB — BASIC METABOLIC PANEL
Anion gap: 7 (ref 5–15)
BUN: 37 mg/dL — AB (ref 6–20)
CO2: 23 mmol/L (ref 22–32)
Calcium: 8.5 mg/dL — ABNORMAL LOW (ref 8.9–10.3)
Chloride: 105 mmol/L (ref 101–111)
Creatinine, Ser: 2.06 mg/dL — ABNORMAL HIGH (ref 0.61–1.24)
GFR calc Af Amer: 37 mL/min — ABNORMAL LOW (ref >60)
GFR, EST NON AFRICAN AMERICAN: 32 mL/min — AB (ref >60)
GLUCOSE: 119 mg/dL — AB (ref 65–99)
POTASSIUM: 4.5 mmol/L (ref 3.5–5.1)
SODIUM: 135 mmol/L (ref 135–145)

## 2014-10-19 MED ORDER — HEPARIN SOD (PORK) LOCK FLUSH 100 UNIT/ML IV SOLN
INTRAVENOUS | Status: AC
  Filled 2014-10-19: qty 5

## 2014-10-19 MED ORDER — HEPARIN SOD (PORK) LOCK FLUSH 100 UNIT/ML IV SOLN
500.0000 [IU] | Freq: Once | INTRAVENOUS | Status: AC
  Administered 2014-10-19: 500 [IU] via INTRAVENOUS

## 2014-10-19 MED ORDER — SODIUM CHLORIDE 0.9 % IJ SOLN
10.0000 mL | INTRAMUSCULAR | Status: DC | PRN
  Administered 2014-10-19: 10 mL via INTRAVENOUS
  Filled 2014-10-19: qty 10

## 2014-10-19 MED ORDER — SODIUM CHLORIDE 0.9 % IV SOLN
INTRAVENOUS | Status: DC
  Administered 2014-10-19: 11:00:00 via INTRAVENOUS

## 2014-10-19 NOTE — Progress Notes (Signed)
Alexander [name] Tolerated IV fluids discharged ambulatory

## 2014-10-19 NOTE — Patient Instructions (Signed)
[place] at [facility] Discharge Instructions  RECOMMENDATIONS MADE BY THE CONSULTANT AND ANY TEST RESULTS WILL BE SENT TO YOUR REFERRING PHYSICIAN.  Fluids today Return Thursday for more fluids We still are holding chemotherapy due to increased renal function. Please call the clinic if you have any questions or concerns  Thank you for choosing [place] at [facility] to provide your oncology and hematology care.  To afford each patient quality time with our provider, please arrive at least 15 minutes before your scheduled appointment time.    You need to re-schedule your appointment should you arrive 10 or more minutes late.  We strive to give you quality time with our providers, and arriving late affects you and other patients whose appointments are after yours.  Also, if you no show three or more times for appointments you may be dismissed from the clinic at the providers discretion.     Again, thank you for choosing [facility]  Our hope is that these requests will decrease the amount of time that you wait before being seen by our physicians.       _____________________________________________________________  Should you have questions after your visit to [facility], please contact our office at [phone] between the hours of 8:30 a.m. and 4:30 p.m.  Voicemails left after 4:30 p.m. will not be returned until the following business day.  For prescription refill requests, have your pharmacy contact our office.

## 2014-10-22 ENCOUNTER — Inpatient Hospital Stay (HOSPITAL_COMMUNITY): Payer: Self-pay

## 2014-10-22 ENCOUNTER — Encounter (HOSPITAL_COMMUNITY): Payer: Self-pay

## 2014-10-22 ENCOUNTER — Encounter (HOSPITAL_BASED_OUTPATIENT_CLINIC_OR_DEPARTMENT_OTHER): Payer: Medicaid Other

## 2014-10-22 VITALS — BP 109/50 | HR 62 | Temp 98.7°F | Resp 18 | Wt 142.6 lb

## 2014-10-22 DIAGNOSIS — C109 Malignant neoplasm of oropharynx, unspecified: Secondary | ICD-10-CM

## 2014-10-22 LAB — BASIC METABOLIC PANEL
ANION GAP: 7 (ref 5–15)
BUN: 33 mg/dL — ABNORMAL HIGH (ref 6–20)
CALCIUM: 9 mg/dL (ref 8.9–10.3)
CHLORIDE: 104 mmol/L (ref 101–111)
CO2: 25 mmol/L (ref 22–32)
CREATININE: 1.97 mg/dL — AB (ref 0.61–1.24)
GFR calc Af Amer: 39 mL/min — ABNORMAL LOW (ref >60)
GFR, EST NON AFRICAN AMERICAN: 34 mL/min — AB (ref >60)
GLUCOSE: 142 mg/dL — AB (ref 65–99)
POTASSIUM: 4.3 mmol/L (ref 3.5–5.1)
Sodium: 136 mmol/L (ref 135–145)

## 2014-10-22 MED ORDER — SODIUM CHLORIDE 0.9 % IJ SOLN: 10.0000 mL | INTRAMUSCULAR | Status: DC | PRN

## 2014-10-22 MED ORDER — HEPARIN SOD (PORK) LOCK FLUSH 100 UNIT/ML IV SOLN
500.0000 [IU] | Freq: Once | INTRAVENOUS | Status: AC
  Administered 2014-10-22: 500 [IU] via INTRAVENOUS
  Filled 2014-10-22: qty 5

## 2014-10-22 MED ORDER — SODIUM CHLORIDE 0.9 % IV SOLN
INTRAVENOUS | Status: DC
  Administered 2014-10-22: 10:00:00 via INTRAVENOUS

## 2014-10-22 NOTE — Patient Instructions (Signed)
[place] at [facility] Discharge Instructions  RECOMMENDATIONS MADE BY THE CONSULTANT AND ANY TEST RESULTS WILL BE SENT TO YOUR REFERRING PHYSICIAN.  IV fluids today Follow up as scheduled Please call the clinic if you have any questions or concerns  Thank you for choosing [place] at [facility] to provide your oncology and hematology care.  To afford each patient quality time with our provider, please arrive at least 15 minutes before your scheduled appointment time.    You need to re-schedule your appointment should you arrive 10 or more minutes late.  We strive to give you quality time with our providers, and arriving late affects you and other patients whose appointments are after yours.  Also, if you no show three or more times for appointments you may be dismissed from the clinic at the providers discretion.     Again, thank you for choosing [facility].  Our hope is that these requests will decrease the amount of time that you wait before being seen by our physicians.       _____________________________________________________________  Should you have questions after your visit to [facility], please contact our office at [phone] between the hours of 8:30 a.m. and 4:30 p.m.  Voicemails left after 4:30 p.m. will not be returned until the following business day.  For prescription refill requests, have your pharmacy contact our office.

## 2014-10-22 NOTE — Progress Notes (Signed)
Alexander [name] Tolerated IV fluids today Discharged ambulatory

## 2014-10-26 ENCOUNTER — Encounter (HOSPITAL_COMMUNITY): Payer: Medicare HMO | Attending: Oncology

## 2014-10-26 DIAGNOSIS — C01 Malignant neoplasm of base of tongue: Secondary | ICD-10-CM | POA: Insufficient documentation

## 2014-10-26 DIAGNOSIS — C109 Malignant neoplasm of oropharynx, unspecified: Secondary | ICD-10-CM | POA: Diagnosis not present

## 2014-10-26 DIAGNOSIS — Z9889 Other specified postprocedural states: Secondary | ICD-10-CM | POA: Insufficient documentation

## 2014-10-26 MED ORDER — SODIUM CHLORIDE 0.9 % IV SOLN
INTRAVENOUS | Status: DC
  Administered 2014-10-26: 12:00:00 via INTRAVENOUS

## 2014-10-26 MED ORDER — HEPARIN SOD (PORK) LOCK FLUSH 100 UNIT/ML IV SOLN
INTRAVENOUS | Status: AC
  Filled 2014-10-26: qty 5

## 2014-10-26 MED ORDER — HEPARIN SOD (PORK) LOCK FLUSH 100 UNIT/ML IV SOLN
500.0000 [IU] | Freq: Once | INTRAVENOUS | Status: AC
  Administered 2014-10-26: 500 [IU] via INTRAVENOUS

## 2014-10-26 NOTE — Progress Notes (Signed)
Patient tolerated infusion well.  He understands to return Thursday for f/y with MD and fluids.

## 2014-10-29 ENCOUNTER — Encounter (HOSPITAL_BASED_OUTPATIENT_CLINIC_OR_DEPARTMENT_OTHER): Payer: Medicare HMO

## 2014-10-29 ENCOUNTER — Encounter (HOSPITAL_COMMUNITY): Payer: Self-pay | Admitting: Hematology & Oncology

## 2014-10-29 ENCOUNTER — Encounter (HOSPITAL_BASED_OUTPATIENT_CLINIC_OR_DEPARTMENT_OTHER): Payer: Medicare HMO | Admitting: Hematology & Oncology

## 2014-10-29 VITALS — BP 105/50 | HR 66 | Temp 98.4°F | Resp 16 | Wt 142.0 lb

## 2014-10-29 DIAGNOSIS — C109 Malignant neoplasm of oropharynx, unspecified: Secondary | ICD-10-CM | POA: Diagnosis not present

## 2014-10-29 MED ORDER — HEPARIN SOD (PORK) LOCK FLUSH 100 UNIT/ML IV SOLN
INTRAVENOUS | Status: AC
  Filled 2014-10-29: qty 5

## 2014-10-29 MED ORDER — HEPARIN SOD (PORK) LOCK FLUSH 100 UNIT/ML IV SOLN
500.0000 [IU] | Freq: Once | INTRAVENOUS | Status: AC
  Administered 2014-10-29: 500 [IU] via INTRAVENOUS

## 2014-10-29 MED ORDER — SODIUM CHLORIDE 0.9 % IJ SOLN
10.0000 mL | INTRAMUSCULAR | Status: DC | PRN
  Administered 2014-10-29: 10 mL via INTRAVENOUS
  Filled 2014-10-29: qty 10

## 2014-10-29 MED ORDER — MORPHINE SULFATE ER 30 MG PO TBCR: 30.0000 mg | EXTENDED_RELEASE_TABLET | Freq: Two times a day (BID) | ORAL | Status: DC

## 2014-10-29 MED ORDER — SODIUM CHLORIDE 0.9 % IV SOLN
INTRAVENOUS | Status: DC
  Administered 2014-10-29: 12:00:00 via INTRAVENOUS

## 2014-10-29 NOTE — Progress Notes (Signed)
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  Oropharyngeal cancer   Staging form: Pharynx - Oropharynx, AJCC 7th Edition     Clinical: Stage IVA (T4a, N2b, M0) - Unsigned    Oropharyngeal cancer   07/27/2014 Imaging CT neck- Advanced stage oropharyngeal cancer with necrotic adenopathy accounting for the left neck swelling.   07/28/2014 Initial Diagnosis Oropharyngeal cancer   08/03/2014 Imaging CT CAP- L supraclavicular lymphadenopathy is not completely visualized. This is better seen on the previous neck CT from 07/27/2014. Otherwise, no evidence for metastatic disease in the chest, abdomen, or pelvis.   08/03/2014 Imaging Bone scan- Uptake at adjacent anterior LEFT 6, 7, 8 ribs likely representing trauma/fractures. Questionable nonspecific increased tracer localization at the posterior RIGHT 8th and 9th ribs, the adjacent nature which raises a a question of trauma as well   08/06/2014 Pathology Results Dr. Benjamine Mola- Oropharynx, biopsy, Left - INVASIVE SQUAMOUS CELL CARCINOMA.   08/12/2014 Procedure Dr. Enrique Sack- 1. Multiple extraction of tooth numbers 6, 17, 22, 23, 24, 25, 26, and 27. 3 Quadrants of alveoloplasty   09/01/2014 - 10/23/2014 Chemotherapy Cisplatin/XRT    CURRENT THERAPY:  Cisplatin and concurrent XRT  INTERVAL HISTORY: Alexander [name] 64 y.o. 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   Patient is in treatment room. When asked about recent consumption of liquids, he says it is hard to swallow. Gestured to his mouth and said "my tongue," indicating pain and difficulty in his mouth.  He's been using his feeding tube to obtain six cans of nutrition a day. He does not vomit after consuming the six cans a day. He has difficulty tolerating other liquids via PEG. He nods in agreement that his biggest problem is pain. He is using percocet. He says that the pain medicine is enough. He says  he needs a variable amount of pain meds, six at the most.  He reports sleeping "pretty good," from about twelve to five.  He reports being a little constipated.  He is finished with radiation. His last cycle of Cisplatin was eliminated secondary to renal toxicity.   Past Medical History  Diagnosis Date  . Oropharyngeal cancer 07/28/2014    dx. 3 weeks ago.- [facility]. [facility] Alexander [name], [place].  . 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     has No Known Allergies.  Current Outpatient Prescriptions on File Prior to Visit  Medication Sig Dispense Refill  . Diphenhyd-Hydrocort-Nystatin (FIRST-DUKES MOUTHWASH) SUSP Use as directed 5 mLs in the mouth or throat 4 (four) times daily as needed. 300 mL 1  . megestrol (MEGACE) 400 MG/10ML suspension Place 10 mLs (400 mg total) into feeding tube daily. 240 mL 0  . Nutritional Supplements (JEVITY 1.2 [name]/FIBER) LIQD 7 cans Jevity 1.2 split into 3 feeds of 2-3-2 cans. Flush 100 mls before/after each feed.  kcals ,92 g Pro, Fluid: 1337 + 600 mls from flushes    . polyethylene glycol (MIRALAX / GLYCOLAX) packet Take 17 g by mouth daily.     No current facility-administered medications on file prior to visit.    Past Surgical History  Procedure Laterality Date  .  Panendoscopy N/A 08/06/2014    Procedure: PANENDOSCOPY WITH BIOPSY;  Surgeon: Alexander Baptist, [name];  Location: [place];  Service: ENT;  Laterality: N/A;  . Multiple extractions with alveoloplasty N/A 08/12/2014    Procedure: Extraction of tooth #'s 6,17,22,23,24,25,26,27 with alveoloplasty;  Surgeon: Alexander [name], DDS;  Location: [facility];  Service: Oral Surgery;  Laterality: N/A;  . Peg placement Left 08/17/14  . Portacath placement Right 08/17/14  . Portacath  placement Right 08/17/2014    Procedure: INSERTION PORT-A-CATH (procedure #2);  Surgeon: Alexander Signs Md, [name];  Location: [facility];  Service: General;  Laterality: Right;  . Peg placement N/A 08/17/2014    Procedure: PERCUTANEOUS ENDOSCOPIC GASTROSTOMY (PEG) PLACEMENT (procedure #1);  Surgeon: Alexander Signs Md, [name];  Location: [facility];  Service: General;  Laterality: N/A;  . Esophagogastroduodenoscopy (egd) with propofol N/A 08/17/2014    Procedure: ESOPHAGOGASTRODUODENOSCOPY (EGD) WITH PROPOFOL (procedure #1);  Surgeon: Alexander Signs Md, [name];  Location: [facility];  Service: General;  Laterality: N/A;    Denies any headaches, dizziness, double vision, fevers, chills, night sweats, nausea, vomiting, diarrhea, chest pain, heart palpitations, shortness of breath, blood in stool, black tarry stool, urinary pain, urinary burning, urinary frequency, hematuria.  Positive for: Difficulty swallowing, sore throat, constipation.  14 point review of systems was performed and is negative except as detailed under history of present illness and above   PHYSICAL EXAMINATION  ECOG PERFORMANCE STATUS: 0 - Asymptomatic  Filed Vitals:   10/29/14 1201  BP: 105/50  Pulse: 66  Temp: 98.4 F (36.9 C)  Resp: 16    GENERAL:alert, no distress,  well developed, comfortable, cooperative, smiling  SKIN: skin color, texture, turgor are normal, no rashes or significant lesions HEAD: Normocephalic EYES: normal, EOMI, Conjunctiva are pink and non-injected EARS: External ears normal OROPHARYNX:lips, buccal mucosa, and mucous membranes are moist Significant oral mucositis noted improved movement of the tongue NECK: XRT changes noted, palpable abnormality in the neck still noted although markedly reduced in size LYMPH:  As above BREAST:not examined LUNGS: Clear to auscultation HEART: Regular rate and rhythm ABDOMEN:abdomen soft and normal bowel sounds, FT is C/D/I BACK: Back symmetric, no curvature. EXTREMITIES:less then 2  second capillary refill, no joint deformities, effusion, or inflammation, no skin discoloration, no cyanosis  NEURO: alert & oriented x 3 with fluent speech, no focal motor/sensory deficits, gait normal   LABORATORY DATA: Results for GINA, COSTILLA (MRN [id number]) as of 12/05/2014 15:47  Ref. 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 Of course we will not have a good idea of his response for several more weeks. We had to hold his last cycle of cisplatin  secondary to renal dysfunction.  He was given a sheet in regards to his thick oral secretions with ideas on how to handle them. I reviewed this again today.  I have given him long-acting morphine and advised him that goal will be to reduce his Percocet usage. In addition the goal of long-acting pain medication provide better long-term constant pain control. Advised him to continue his MiraLAX and if he develops worsening constipation to let us know immediately.  We will continue administering IV fluids and monitoring his hydration and renal function.  Follow-up in one week.  All questions were answered. The patient knows to call  the clinic with any problems, questions or concerns. We can certainly see the patient much sooner if necessary.  This document serves as a record of services personally performed by Ancil Linsey, [name]. It was created on her behalf by Toni Amend, a trained medical scribe. The creation of this record is based on the scribe's personal observations and the provider's statements to them. This document has been checked and approved by the attending provider.  I have reviewed the above documentation for accuracy and completeness, and I agree with the above.   Kelby Fam. Whitney Muse, [name]

## 2014-10-29 NOTE — Patient Instructions (Signed)
[place] at [facility] Discharge Instructions  RECOMMENDATIONS MADE BY THE CONSULTANT AND ANY TEST RESULTS WILL BE SENT TO YOUR REFERRING PHYSICIAN.  Exam done and seen by Dr. Whitney Muse Continue coming every day to get IV Fluids Follow up for a doctor visit in 1 week.  Thank you for choosing [place] at [facility] to provide your oncology and hematology care.  To afford each patient quality time with our provider, please arrive at least 15 minutes before your scheduled appointment time.    You need to re-schedule your appointment should you arrive 10 or more minutes late.  We strive to give you quality time with our providers, and arriving late affects you and other patients whose appointments are after yours.  Also, if you no show three or more times for appointments you may be dismissed from the clinic at the providers discretion.     Again, thank you for choosing [facility].  Our hope is that these requests will decrease the amount of time that you wait before being seen by our physicians.       _____________________________________________________________  Should you have questions after your visit to [facility], please contact our office at [phone] between the hours of 8:30 a.m. and 4:30 p.m.  Voicemails left after 4:30 p.m. will not be returned until the following business day.  For prescription refill requests, have your pharmacy contact our office.

## 2014-10-30 ENCOUNTER — Encounter (HOSPITAL_BASED_OUTPATIENT_CLINIC_OR_DEPARTMENT_OTHER): Payer: Medicare HMO

## 2014-10-30 VITALS — BP 106/59 | HR 52 | Temp 98.3°F | Resp 16

## 2014-10-30 DIAGNOSIS — C109 Malignant neoplasm of oropharynx, unspecified: Secondary | ICD-10-CM | POA: Diagnosis not present

## 2014-10-30 MED ORDER — SODIUM CHLORIDE 0.9 % IJ SOLN
10.0000 mL | Freq: Once | INTRAMUSCULAR | Status: AC
  Administered 2014-10-30: 10 mL via INTRAVENOUS

## 2014-10-30 MED ORDER — HEPARIN SOD (PORK) LOCK FLUSH 100 UNIT/ML IV SOLN
500.0000 [IU] | Freq: Once | INTRAVENOUS | Status: AC
  Administered 2014-10-30: 500 [IU] via INTRAVENOUS

## 2014-10-30 MED ORDER — HEPARIN SOD (PORK) LOCK FLUSH 100 UNIT/ML IV SOLN
INTRAVENOUS | Status: AC
  Filled 2014-10-30: qty 5

## 2014-10-30 MED ORDER — SODIUM CHLORIDE 0.9 % IV SOLN
INTRAVENOUS | Status: AC
  Administered 2014-10-30: 12:00:00 via INTRAVENOUS

## 2014-10-30 NOTE — Patient Instructions (Signed)
[place] at [facility] Discharge Instructions  RECOMMENDATIONS MADE BY THE CONSULTANT AND ANY TEST RESULTS WILL BE SENT TO YOUR REFERRING PHYSICIAN.  1 liter IV normal saline given today as ordered. Return as scheduled.  Thank you for choosing [place] at [facility] to provide your oncology and hematology care.  To afford each patient quality time with our provider, please arrive at least 15 minutes before your scheduled appointment time.    You need to re-schedule your appointment should you arrive 10 or more minutes late.  We strive to give you quality time with our providers, and arriving late affects you and other patients whose appointments are after yours.  Also, if you no show three or more times for appointments you may be dismissed from the clinic at the providers discretion.     Again, thank you for choosing [facility].  Our hope is that these requests will decrease the amount of time that you wait before being seen by our physicians.       _____________________________________________________________  Should you have questions after your visit to [facility], please contact our office at [phone] between the hours of 8:30 a.m. and 4:30 p.m.  Voicemails left after 4:30 p.m. will not be returned until the following business day.  For prescription refill requests, have your pharmacy contact our office.

## 2014-10-30 NOTE — Progress Notes (Signed)
Tolerated IV fluids well.

## 2014-11-02 ENCOUNTER — Encounter (HOSPITAL_BASED_OUTPATIENT_CLINIC_OR_DEPARTMENT_OTHER): Payer: Medicare HMO

## 2014-11-02 VITALS — BP 126/60 | HR 49 | Temp 98.4°F | Resp 18

## 2014-11-02 DIAGNOSIS — C01 Malignant neoplasm of base of tongue: Secondary | ICD-10-CM | POA: Diagnosis present

## 2014-11-02 DIAGNOSIS — Z95828 Presence of other vascular implants and grafts: Secondary | ICD-10-CM

## 2014-11-02 DIAGNOSIS — C109 Malignant neoplasm of oropharynx, unspecified: Secondary | ICD-10-CM

## 2014-11-02 DIAGNOSIS — Z9889 Other specified postprocedural states: Secondary | ICD-10-CM | POA: Diagnosis present

## 2014-11-02 LAB — CBC WITH DIFFERENTIAL/PLATELET
Basophils Absolute: 0 10*3/uL (ref 0.0–0.1)
Basophils Relative: 0 % (ref 0–1)
EOS PCT: 3 % (ref 0–5)
Eosinophils Absolute: 0.1 10*3/uL (ref 0.0–0.7)
HEMATOCRIT: 27.7 % — AB (ref 39.0–52.0)
Hemoglobin: 9 g/dL — ABNORMAL LOW (ref 13.0–17.0)
LYMPHS PCT: 14 % (ref 12–46)
Lymphs Abs: 0.4 10*3/uL — ABNORMAL LOW (ref 0.7–4.0)
MCH: 30.6 pg (ref 26.0–34.0)
MCHC: 32.5 g/dL (ref 30.0–36.0)
MCV: 94.2 fL (ref 78.0–100.0)
MONOS PCT: 21 % — AB (ref 3–12)
Monocytes Absolute: 0.6 10*3/uL (ref 0.1–1.0)
NEUTROS ABS: 1.6 10*3/uL — AB (ref 1.7–7.7)
NEUTROS PCT: 62 % (ref 43–77)
Platelets: 222 10*3/uL (ref 150–400)
RBC: 2.94 MIL/uL — AB (ref 4.22–5.81)
RDW: 15.6 % — ABNORMAL HIGH (ref 11.5–15.5)
WBC: 2.7 10*3/uL — ABNORMAL LOW (ref 4.0–10.5)

## 2014-11-02 LAB — COMPREHENSIVE METABOLIC PANEL
ALK PHOS: 44 U/L (ref 38–126)
ALT: 14 U/L — ABNORMAL LOW (ref 17–63)
ANION GAP: 9 (ref 5–15)
AST: 19 U/L (ref 15–41)
Albumin: 3.4 g/dL — ABNORMAL LOW (ref 3.5–5.0)
BUN: 28 mg/dL — ABNORMAL HIGH (ref 6–20)
CO2: 27 mmol/L (ref 22–32)
Calcium: 8.7 mg/dL — ABNORMAL LOW (ref 8.9–10.3)
Chloride: 102 mmol/L (ref 101–111)
Creatinine, Ser: 2.12 mg/dL — ABNORMAL HIGH (ref 0.61–1.24)
GFR calc non Af Amer: 31 mL/min — ABNORMAL LOW (ref >60)
GFR, EST AFRICAN AMERICAN: 36 mL/min — AB (ref >60)
GLUCOSE: 106 mg/dL — AB (ref 65–99)
POTASSIUM: 4.8 mmol/L (ref 3.5–5.1)
Sodium: 138 mmol/L (ref 135–145)
Total Bilirubin: 0.3 mg/dL (ref 0.3–1.2)
Total Protein: 6.5 g/dL (ref 6.5–8.1)

## 2014-11-02 MED ORDER — SODIUM CHLORIDE 0.9 % IV SOLN
INTRAVENOUS | Status: AC
  Administered 2014-11-02: 14:00:00 via INTRAVENOUS

## 2014-11-02 MED ORDER — HEPARIN SOD (PORK) LOCK FLUSH 100 UNIT/ML IV SOLN
500.0000 [IU] | Freq: Once | INTRAVENOUS | Status: AC
  Administered 2014-11-02: 500 [IU] via INTRAVENOUS

## 2014-11-02 MED ORDER — HEPARIN SOD (PORK) LOCK FLUSH 100 UNIT/ML IV SOLN
INTRAVENOUS | Status: AC
  Filled 2014-11-02: qty 5

## 2014-11-02 NOTE — Patient Instructions (Signed)
[place] at [facility] Discharge Instructions  RECOMMENDATIONS MADE BY THE CONSULTANT AND ANY TEST RESULTS WILL BE SENT TO YOUR REFERRING PHYSICIAN.  1 liter IV normal saline given as ordered. Return as scheduled.  Thank you for choosing [place] at [facility] to provide your oncology and hematology care.  To afford each patient quality time with our provider, please arrive at least 15 minutes before your scheduled appointment time.    You need to re-schedule your appointment should you arrive 10 or more minutes late.  We strive to give you quality time with our providers, and arriving late affects you and other patients whose appointments are after yours.  Also, if you no show three or more times for appointments you may be dismissed from the clinic at the providers discretion.     Again, thank you for choosing [facility].  Our hope is that these requests will decrease the amount of time that you wait before being seen by our physicians.       _____________________________________________________________  Should you have questions after your visit to [facility], please contact our office at [phone] between the hours of 8:30 a.m. and 4:30 p.m.  Voicemails left after 4:30 p.m. will not be returned until the following business day.  For prescription refill requests, have your pharmacy contact our office.

## 2014-11-02 NOTE — Progress Notes (Signed)
Tolerated IV fluids well.

## 2014-11-03 ENCOUNTER — Encounter (HOSPITAL_BASED_OUTPATIENT_CLINIC_OR_DEPARTMENT_OTHER): Payer: Medicare HMO

## 2014-11-03 VITALS — BP 126/51 | HR 57 | Temp 97.9°F | Resp 16

## 2014-11-03 DIAGNOSIS — C109 Malignant neoplasm of oropharynx, unspecified: Secondary | ICD-10-CM | POA: Diagnosis not present

## 2014-11-03 MED ORDER — HEPARIN SOD (PORK) LOCK FLUSH 100 UNIT/ML IV SOLN
INTRAVENOUS | Status: AC
  Filled 2014-11-03: qty 5

## 2014-11-03 MED ORDER — SODIUM CHLORIDE 0.9 % IJ SOLN
10.0000 mL | Freq: Once | INTRAMUSCULAR | Status: AC
  Administered 2014-11-03: 10 mL via INTRAVENOUS

## 2014-11-03 MED ORDER — SODIUM CHLORIDE 0.9 % IV SOLN
INTRAVENOUS | Status: AC
  Administered 2014-11-03: 13:00:00 via INTRAVENOUS

## 2014-11-03 MED ORDER — HEPARIN SOD (PORK) LOCK FLUSH 100 UNIT/ML IV SOLN
500.0000 [IU] | Freq: Once | INTRAVENOUS | Status: AC
  Administered 2014-11-03: 500 [IU] via INTRAVENOUS

## 2014-11-03 NOTE — Progress Notes (Signed)
Tolerated IV fluids well.

## 2014-11-04 ENCOUNTER — Encounter (HOSPITAL_COMMUNITY): Payer: Self-pay

## 2014-11-04 ENCOUNTER — Encounter (HOSPITAL_BASED_OUTPATIENT_CLINIC_OR_DEPARTMENT_OTHER): Payer: Medicare HMO | Admitting: Hematology & Oncology

## 2014-11-04 ENCOUNTER — Encounter (HOSPITAL_BASED_OUTPATIENT_CLINIC_OR_DEPARTMENT_OTHER): Payer: Medicare HMO

## 2014-11-04 VITALS — BP 106/56 | HR 53 | Temp 98.0°F | Resp 16

## 2014-11-04 DIAGNOSIS — C109 Malignant neoplasm of oropharynx, unspecified: Secondary | ICD-10-CM | POA: Diagnosis not present

## 2014-11-04 MED ORDER — HEPARIN SOD (PORK) LOCK FLUSH 100 UNIT/ML IV SOLN
500.0000 [IU] | Freq: Once | INTRAVENOUS | Status: AC
  Administered 2014-11-04: 500 [IU] via INTRAVENOUS
  Filled 2014-11-04: qty 5

## 2014-11-04 MED ORDER — SODIUM CHLORIDE 0.9 % IJ SOLN
10.0000 mL | INTRAMUSCULAR | Status: DC | PRN
  Administered 2014-11-04: 10 mL via INTRAVENOUS
  Filled 2014-11-04: qty 10

## 2014-11-04 MED ORDER — SODIUM CHLORIDE 0.9 % IV SOLN
INTRAVENOUS | Status: DC
  Administered 2014-11-04: 13:00:00 via INTRAVENOUS

## 2014-11-04 NOTE — Patient Instructions (Signed)
Alexander [name] at [facility] Discharge Instructions  RECOMMENDATIONS MADE BY THE CONSULTANT AND ANY TEST RESULTS WILL BE SENT TO YOUR REFERRING PHYSICIAN.  One liter of IV fluids infused today. Return tomorrow and Friday for IV fluid infusion.  Thank you for choosing [place] at [facility] to provide your oncology and hematology care.  To afford each patient quality time with our provider, please arrive at least 15 minutes before your scheduled appointment time.    You need to re-schedule your appointment should you arrive 10 or more minutes late.  We strive to give you quality time with our providers, and arriving late affects you and other patients whose appointments are after yours.  Also, if you no show three or more times for appointments you may be dismissed from the clinic at the providers discretion.     Again, thank you for choosing [facility].  Our hope is that these requests will decrease the amount of time that you wait before being seen by our physicians.       _____________________________________________________________  Should you have questions after your visit to [facility], please contact our office at [phone] between the hours of 8:30 a.m. and 4:30 p.m.  Voicemails left after 4:30 p.m. will not be returned until the following business day.  For prescription refill requests, have your pharmacy contact our office.

## 2014-11-04 NOTE — Progress Notes (Signed)
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  Oropharyngeal cancer   Staging form: Pharynx - Oropharynx, AJCC 7th Edition     Clinical: Stage IVA (T4a, N2b, M0) - Unsigned    CURRENT THERAPY:  Cisplatin and concurrent XRT  INTERVAL HISTORY: Alexander [name] 64 y.o. 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   The patient states that his pain level has improved since last Friday.  His long acting pain medication prescribed has been working for him and he has not had to take his short acting as often as before.  He states that his pain is currently "level' and much more tolerable.  He complains that the medication does make him itch however.   He admits to attempting to eat fried chicken but was not able to because it scratched his throat.  His tongue movement has improved though and does not cause him as much pain as before.  He says that he is still drinking water and Ensure.  He has an appointment with his Radiation Doctor in 2 weeks.    Oropharyngeal cancer   07/27/2014 Imaging CT neck- Advanced stage oropharyngeal cancer with necrotic adenopathy accounting for the left neck swelling.   07/28/2014 Initial Diagnosis Oropharyngeal cancer   08/03/2014 Imaging CT CAP- L supraclavicular lymphadenopathy is not completely visualized. This is better seen on the previous neck CT from 07/27/2014. Otherwise, no evidence for metastatic disease in the chest, abdomen, or pelvis.   08/03/2014 Imaging Bone scan- Uptake at adjacent anterior LEFT 6, 7, 8 ribs likely representing trauma/fractures. Questionable nonspecific increased tracer localization at the posterior RIGHT 8th and 9th ribs, the adjacent nature which raises a a question of trauma as well   08/06/2014 Pathology Results Dr. Benjamine Mola- Oropharynx, biopsy, Left - INVASIVE SQUAMOUS CELL CARCINOMA.   08/12/2014 Procedure Dr. Enrique Sack- 1. Multiple  extraction of tooth numbers 6, 17, 22, 23, 24, 25, 26, and 27. 3 Quadrants of alveoloplasty   09/01/2014 - 10/23/2014 Chemotherapy Cisplatin/XRT     Past Medical History  Diagnosis Date  . Oropharyngeal cancer 07/28/2014    dx. 3 weeks ago.- Dr. Oneal [facility] [place], [place].  . 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     has No Known Allergies.  Current Outpatient Prescriptions on File Prior to Visit  Medication Sig Dispense Refill  . Diphenhyd-Hydrocort-Nystatin (FIRST-DUKES MOUTHWASH) SUSP Use as directed 5 mLs in the mouth or throat 4 (four) times daily as needed. 300 mL 1  . megestrol (MEGACE) 400 MG/10ML suspension Place 10 mLs (400 mg total) into feeding tube daily. 240 mL 0  . morphine (MS CONTIN) 30 MG 12 hr tablet Take 1 tablet (30 mg total) by mouth every 12 (twelve) hours. (Patient not taking: Reported on 11/24/2014) 60 tablet 0  . Nutritional Supplements (JEVITY 1.2 CAL/FIBER) LIQD 7 cans Jevity 1.2 split into 3 feeds of 2-3-2 cans. Flush 100 mls before/after each feed.  kcals ,92 g Pro, Fluid: 1337 + 600 mls from flushes    . polyethylene glycol (MIRALAX / GLYCOLAX) packet Take 17 g by mouth daily.    . SPS 15 GM/60ML suspension  No current facility-administered medications on file prior to visit.    Past Surgical History  Procedure Laterality Date  . Panendoscopy N/A 08/06/2014    Procedure: PANENDOSCOPY WITH BIOPSY;  Surgeon: Leta Baptist, MD;  Location: [place];  Service: ENT;  Laterality: N/A;  . Multiple extractions with alveoloplasty N/A 08/12/2014    Procedure: Extraction of tooth #'s 6,17,22,23,24,25,26,27 with alveoloplasty;  Surgeon: Lenn Cal, DDS;  Location: [facility];  Service: Oral Surgery;  Laterality: N/A;  . Peg  placement Left 08/17/14  . Portacath placement Right 08/17/14  . Portacath placement Right 08/17/2014    Procedure: INSERTION PORT-A-CATH (procedure #2);  Surgeon: Aviva Signs Md, MD;  Location: [facility];  Service: General;  Laterality: Right;  . Peg placement N/A 08/17/2014    Procedure: PERCUTANEOUS ENDOSCOPIC GASTROSTOMY (PEG) PLACEMENT (procedure #1);  Surgeon: Aviva Signs Md, MD;  Location: [facility];  Service: General;  Laterality: N/A;  . Esophagogastroduodenoscopy (egd) with propofol N/A 08/17/2014    Procedure: ESOPHAGOGASTRODUODENOSCOPY (EGD) WITH PROPOFOL (procedure #1);  Surgeon: Aviva Signs Md, MD;  Location: [facility];  Service: General;  Laterality: N/A;    Denies any headaches, dizziness, double vision, fevers, chills, night sweats, nausea, vomiting, diarrhea, constipation, chest pain, heart palpitations, shortness of breath, blood in stool, black tarry stool, urinary pain, urinary burning, urinary frequency, hematuria. 14 point review of systems was performed and is negative except as detailed under history of present illness and above  PHYSICAL EXAMINATION  ECOG PERFORMANCE STATUS: 0 - Asymptomatic  Filed Vitals:   11/04/14 1447  BP: 106/56  Pulse: 53  Temp: 98 F (36.7 C)  Resp: 16    GENERAL:alert, no distress, well nourished, well developed, comfortable, cooperative, smiling  SKIN: skin color, texture, turgor are normal, no rashes or significant lesions HEAD: Normocephalic EYES: normal, EOMI, Conjunctiva are pink and non-injected EARS: External ears normal OROPHARYNX:lips, buccal mucosa, and mucous membranes are moist Significant oral mucositis noted NECK: Left neck mass is remarkably smaller but still present approx 4 cm. Mobile.  LYMPH:  As above BREAST:not examined LUNGS: Clear to auscultation HEART: Regular rate and rhythm ABDOMEN:abdomen soft and normal bowel sounds, FT is C/D/I BACK: Back symmetric, no curvature. EXTREMITIES:less then 2 second capillary  refill, no joint deformities, effusion, or inflammation, no skin discoloration, no cyanosis  NEURO: alert & oriented x 3 with fluent speech, no focal motor/sensory deficits, gait normal   LABORATORY DATA: I have reviewed the data detailed below Results for Alexander [name], Alexander [name] (MRN [id number]) as of 12/06/2014 08:02  Ref. Range 11/02/2014 13:30  Sodium Latest Ref Range: 135-145 mmol/L 138  Potassium Latest Ref Range: 3.5-5.1 mmol/L 4.8  Chloride Latest Ref Range: 101-111 mmol/L 102  CO2 Latest Ref Range: 22-32 mmol/L 27  BUN Latest Ref Range: 6-20 mg/dL 28 (H)  Creatinine Latest Ref Range: 0.61-1.24 mg/dL 2.12 (H)  Calcium Latest Ref Range: 8.9-10.3 mg/dL 8.7 (L)  EGFR (Non-African Amer.) Latest Ref Range: >60 mL/min 31 (L)  EGFR (African American) Latest Ref Range: >60 mL/min 36 (L)  Glucose Latest Ref Range: 65-99 mg/dL 106 (H)  Anion gap Latest Ref Range: 5-15  9  Alkaline Phosphatase Latest Ref Range: 38-126 U/L 44  Albumin Latest Ref Range: 3.5-5.0 g/dL 3.4 (L)  AST Latest Ref Range: 15-41 U/L 19  ALT Latest Ref Range: 17-63 U/L 14 (L)  Total Protein Latest Ref Range: 6.5-8.1 g/dL 6.5  Total Bilirubin Latest Ref Range: 0.3-1.2 mg/dL 0.3  WBC Latest Ref Range: 4.0-10.5 K/uL 2.7 (  L)  RBC Latest Ref Range: 4.22-5.81 MIL/uL 2.94 (L)  Hemoglobin Latest Ref Range: 13.0-17.0 g/dL 9.0 (L)  HCT Latest Ref Range: 39.0-52.0 % 27.7 (L)  MCV Latest Ref Range: 78.0-100.0 fL 94.2  MCH Latest Ref Range: 26.0-34.0 pg 30.6  MCHC Latest Ref Range: 30.0-36.0 g/dL 32.5  RDW Latest Ref Range: 11.5-15.5 % 15.6 (H)  Platelets Latest Ref Range: 150-400 K/uL 222  Neutrophils Latest Ref Range: 43-77 % 62  Lymphocytes Latest Ref Range: 12-46 % 14  Monocytes Relative Latest Ref Range: 3-12 % 21 (H)  Eosinophil Latest Ref Range: 0-5 % 3  Basophil Latest Ref Range: 0-1 % 0  NEUT# Latest Ref Range: 1.7-7.7 K/uL 1.6 (L)  Lymphocyte # Latest Ref Range: 0.7-4.0 K/uL 0.4 (L)  Monocyte # Latest Ref Range: 0.1-1.0  K/uL 0.6  Eosinophils Absolute Latest Ref Range: 0.0-0.7 K/uL 0.1  Basophils Absolute Latest Ref Range: 0.0-0.1 K/uL 0.0     ASSESSMENT AND PLAN:  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 I have asked him if he wants me to rotate his pain medication to improve his pruritus. He states it is tolerable. He will therefore continue with his long-acting pain medication and Percocet for breakthrough.  I advised him that some of his pain improvement is also continuing to move further out from therapy. He will obviously need repeat imaging given the persistent findings in the neck I will work with Dr. Isidore Moos in regards to timing of this.  I have advised him if he has difficulties with his fluid intake to continue to let us know and come in for IV hydration but they should be improving. I have encouraged him to continue with trying oral intake but to use soft foods.  We will tentatively see him back again in 2 weeks.  All questions were answered. The patient knows to call the clinic with any problems, questions or concerns. We can certainly see the patient much sooner if necessary.   This document serves as a record of services personally performed by Ancil Linsey, MD. It was created on her behalf by Janace Hoard, a trained medical scribe. The creation of this record is based on the scribe's personal observations and the provider's statements to them. This document has been checked and approved by the attending provider.  I have reviewed the above documentation for accuracy and completeness, and I agree with the above.   This note was electronically signed.  Kelby Fam. Whitney Muse, MD

## 2014-11-04 NOTE — Patient Instructions (Signed)
[place] at [facility] Discharge Instructions  RECOMMENDATIONS MADE BY THE CONSULTANT AND ANY TEST RESULTS WILL BE SENT TO YOUR REFERRING PHYSICIAN.  One liter of IV fluids infused today. Return tomorrow and Friday for IV fluid infusion.  Thank you for choosing [place] at [facility] to provide your oncology and hematology care.  To afford each patient quality time with our provider, please arrive at least 15 minutes before your scheduled appointment time.    You need to re-schedule your appointment should you arrive 10 or more minutes late.  We strive to give you quality time with our providers, and arriving late affects you and other patients whose appointments are after yours.  Also, if you no show three or more times for appointments you may be dismissed from the clinic at the providers discretion.     Again, thank you for choosing [facility].  Our hope is that these requests will decrease the amount of time that you wait before being seen by our physicians.       _____________________________________________________________  Should you have questions after your visit to [facility], please contact our office at [phone] between the hours of 8:30 a.m. and 4:30 p.m.  Voicemails left after 4:30 p.m. will not be returned until the following business day.  For prescription refill requests, have your pharmacy contact our office.

## 2014-11-05 ENCOUNTER — Ambulatory Visit (HOSPITAL_COMMUNITY): Payer: Self-pay

## 2014-11-05 ENCOUNTER — Encounter (HOSPITAL_COMMUNITY): Payer: Self-pay

## 2014-11-05 ENCOUNTER — Encounter (HOSPITAL_BASED_OUTPATIENT_CLINIC_OR_DEPARTMENT_OTHER): Payer: Medicare HMO

## 2014-11-05 DIAGNOSIS — C109 Malignant neoplasm of oropharynx, unspecified: Secondary | ICD-10-CM

## 2014-11-05 MED ORDER — SODIUM CHLORIDE 0.9 % IV SOLN
INTRAVENOUS | Status: AC
  Administered 2014-11-05: 11:00:00 via INTRAVENOUS

## 2014-11-05 MED ORDER — SODIUM CHLORIDE 0.9 % IJ SOLN
10.0000 mL | INTRAMUSCULAR | Status: DC | PRN
Start: 2014-11-05 — End: 2014-11-05
  Administered 2014-11-05: 10 mL via INTRAVENOUS
  Filled 2014-11-05: qty 10

## 2014-11-05 MED ORDER — HEPARIN SOD (PORK) LOCK FLUSH 100 UNIT/ML IV SOLN
500.0000 [IU] | Freq: Once | INTRAVENOUS | Status: AC
  Administered 2014-11-05: 500 [IU] via INTRAVENOUS
  Filled 2014-11-05: qty 5

## 2014-11-05 NOTE — Progress Notes (Signed)
1245:  A&Ox4.  VSS.  Discharged ambulatory.

## 2014-11-05 NOTE — Patient Instructions (Signed)
[place] at [facility] Discharge Instructions  RECOMMENDATIONS MADE BY THE CONSULTANT AND ANY TEST RESULTS WILL BE SENT TO YOUR REFERRING PHYSICIAN.  One liter of IV fluids given today. Return tomorrow as scheduled to receive fluids.  Thank you for choosing [place] at [facility] to provide your oncology and hematology care.  To afford each patient quality time with our provider, please arrive at least 15 minutes before your scheduled appointment time.    You need to re-schedule your appointment should you arrive 10 or more minutes late.  We strive to give you quality time with our providers, and arriving late affects you and other patients whose appointments are after yours.  Also, if you no show three or more times for appointments you may be dismissed from the clinic at the providers discretion.     Again, thank you for choosing [facility].  Our hope is that these requests will decrease the amount of time that you wait before being seen by our physicians.       _____________________________________________________________  Should you have questions after your visit to [facility], please contact our office at [phone] between the hours of 8:30 a.m. and 4:30 p.m.  Voicemails left after 4:30 p.m. will not be returned until the following business day.  For prescription refill requests, have your pharmacy contact our office.

## 2014-11-06 ENCOUNTER — Encounter (HOSPITAL_BASED_OUTPATIENT_CLINIC_OR_DEPARTMENT_OTHER): Payer: Medicare HMO

## 2014-11-06 ENCOUNTER — Encounter (HOSPITAL_COMMUNITY): Payer: Self-pay

## 2014-11-06 ENCOUNTER — Encounter: Payer: Self-pay | Admitting: Dietician

## 2014-11-06 DIAGNOSIS — C109 Malignant neoplasm of oropharynx, unspecified: Secondary | ICD-10-CM

## 2014-11-06 MED ORDER — SODIUM CHLORIDE 0.9 % IV SOLN
INTRAVENOUS | Status: DC
  Administered 2014-11-06: 09:00:00 via INTRAVENOUS

## 2014-11-06 MED ORDER — HEPARIN SOD (PORK) LOCK FLUSH 100 UNIT/ML IV SOLN
500.0000 [IU] | Freq: Once | INTRAVENOUS | Status: AC
  Administered 2014-11-06: 500 [IU] via INTRAVENOUS
  Filled 2014-11-06: qty 5

## 2014-11-06 MED ORDER — HEPARIN SOD (PORK) LOCK FLUSH 100 UNIT/ML IV SOLN
INTRAVENOUS | Status: AC
  Filled 2014-11-06: qty 5

## 2014-11-06 NOTE — Patient Instructions (Signed)
[place] at [facility] Discharge Instructions  RECOMMENDATIONS MADE BY THE CONSULTANT AND ANY TEST RESULTS WILL BE SENT TO YOUR REFERRING PHYSICIAN.  IV fluids today Return as scheduled Please call the clinic if you have any questions or concerns   Thank you for choosing [place] at [facility] to provide your oncology and hematology care.  To afford each patient quality time with our provider, please arrive at least 15 minutes before your scheduled appointment time.    You need to re-schedule your appointment should you arrive 10 or more minutes late.  We strive to give you quality time with our providers, and arriving late affects you and other patients whose appointments are after yours.  Also, if you no show three or more times for appointments you may be dismissed from the clinic at the providers discretion.     Again, thank you for choosing [facility].  Our hope is that these requests will decrease the amount of time that you wait before being seen by our physicians.       _____________________________________________________________  Should you have questions after your visit to [facility], please contact our office at [phone] between the hours of 8:30 a.m. and 4:30 p.m.  Voicemails left after 4:30 p.m. will not be returned until the following business day.  For prescription refill requests, have your pharmacy contact our office.

## 2014-11-06 NOTE — Progress Notes (Signed)
Following up with pt in light of further wt loss (before weighing today)  Contacted Pt by  Visiting during IV fluids  Wt Readings from Last 10 Encounters:  11/06/14 140 lb 12.8 oz (63.866 kg)  11/05/14 138 lb 12.8 oz (62.959 kg)  11/04/14 138 lb (62.596 kg)  10/29/14 142 lb (64.411 kg)  10/22/14 142 lb 9.6 oz (64.683 kg)  10/19/14 143 lb 3.2 oz (64.955 kg)  10/14/14 142 lb 6.4 oz (64.592 kg)  09/30/14 145 lb 12.8 oz (66.134 kg)  09/22/14 146 lb 6.4 oz (66.407 kg)  09/14/14 155 lb (70.308 kg)   Patient weight had 4 lbs,possibly dehydration, until he was repleted yesterday.   Patient reports oral intake as improving. He is done with radiation and has been starting to be able to eat more. He said he ate onions and potatoes yesterday and has even tried fried chicken, but that irritated his throat.   He states that he is compliant with his flushes, but he says he has not been doing to 2-3-2 cans because it caused him bloating. Instead he has been doing 2-2-2 cans of Jevity 1.2. Which was what he was doing before i bumped it up because of decreased PO intake. Given his oral intake is improving this should be ok.   This TF regimen provides: Kcals: 1706 kcals Protein: 79 g Pro Fluid: 1150 ml fluid + 900 mls from flushes  Discussed with pt the importance of adequate hydration. I told him that I want to increase his flushes to 150 before and after each feeds. Thereby increasing his fluid from flushes by 50%.   Pt was agreeable to plan.   [name] RD, LDN Nutrition Pager: [phone] 11/06/2014 10:27 AM

## 2014-11-06 NOTE — Progress Notes (Signed)
Tolerated IV fluids well.

## 2014-11-11 ENCOUNTER — Other Ambulatory Visit: Payer: Self-pay | Admitting: Radiation Oncology

## 2014-11-11 DIAGNOSIS — C01 Malignant neoplasm of base of tongue: Secondary | ICD-10-CM

## 2014-11-23 ENCOUNTER — Ambulatory Visit (HOSPITAL_COMMUNITY): Payer: Self-pay | Admitting: Dentistry

## 2014-11-23 ENCOUNTER — Encounter (HOSPITAL_COMMUNITY): Payer: Self-pay | Admitting: Dentistry

## 2014-11-23 VITALS — BP 122/63 | HR 46 | Temp 98.0°F | Wt 150.0 lb

## 2014-11-23 DIAGNOSIS — K08109 Complete loss of teeth, unspecified cause, unspecified class: Secondary | ICD-10-CM

## 2014-11-23 DIAGNOSIS — Z9221 Personal history of antineoplastic chemotherapy: Secondary | ICD-10-CM

## 2014-11-23 DIAGNOSIS — Z0189 Encounter for other specified special examinations: Secondary | ICD-10-CM

## 2014-11-23 DIAGNOSIS — Z923 Personal history of irradiation: Secondary | ICD-10-CM

## 2014-11-23 DIAGNOSIS — C01 Malignant neoplasm of base of tongue: Secondary | ICD-10-CM

## 2014-11-23 DIAGNOSIS — R432 Parageusia: Secondary | ICD-10-CM

## 2014-11-23 DIAGNOSIS — K0820 Unspecified atrophy of edentulous alveolar ridge: Secondary | ICD-10-CM

## 2014-11-23 DIAGNOSIS — R1310 Dysphagia, unspecified: Secondary | ICD-10-CM

## 2014-11-23 DIAGNOSIS — R682 Dry mouth, unspecified: Secondary | ICD-10-CM

## 2014-11-23 DIAGNOSIS — K117 Disturbances of salivary secretion: Secondary | ICD-10-CM

## 2014-11-23 NOTE — Progress Notes (Signed)
11/23/2014  Patient Name:   Alexander [name] Date of Birth:   [birth year] Medical Record Number: [id number]  BP 122/63 mmHg  Pulse 46  Temp(Src) 98 F (36.7 C) (Oral)  Wt 150 lb (68.04 kg)  Cosmo M Calma presents for oral examination after chemoradiation therapy. Patient has completed all radiation treatments from 09/03/2014 through 10/23/2014. Patient completed 4 cycles of chemotherapy.   REVIEW OF CHIEF COMPLAINTS:  DRY MOUTH: Yes HARD TO SWALLOW: Yes, at times.  HURT TO SWALLOW: Yes, at times TASTE CHANGES: Taste is returning. SORES IN MOUTH: None TRISMUS: No trismus symptoms. WEIGHT: 150 pounds down from initial 160 pounds  HOME OH REGIMEN:  BRUSHING: Brushing his tongue daily. FLOSSING: Not applicable RINSING: Using salt water and baking soda rinses. FLUORIDE: Not applicable TRISMUS EXERCISES:  Maximum interincisal opening: 50 mm   DENTAL EXAM:  Oral Hygiene:(PLAQUE): Edentulous LOCATION OF MUCOSITIS: None noted DESCRIPTION OF SALIVA: Decreased and foamy saliva. Mild xerostomia ANY EXPOSED BONE: None noted OTHER WATCHED AREAS: Previous extraction sites DX: Xerostomia, Dysgeusia, Dysphagia, Odynophagia and Edentulous  RECOMMENDATIONS: 1. Brush tongue daily.  2. Use trismus exercises as directed. 3. Use Biotene Rinse or salt water/baking soda rinses. 4. Multiple sips of water as needed. 5. Return to clinic in two months for start of upper lower complete dentures after Medicaid prior approval is received.  Call if problems before then.  Lenn Cal, DDS

## 2014-11-23 NOTE — Patient Instructions (Addendum)
RECOMMENDATIONS: 1. Brush tongue daily.  2. Use trismus exercises as directed. 3. Use Biotene Rinse or salt water/baking soda rinses. 4. Multiple sips of water as needed. 5. Return to clinic in two months for start of upper lower complete dentures after Medicaid prior approval is received.  Call if problems before then.  Lenn Cal, DDS  RADIATION THERAPY AND DECISIONS REGARDING YOUR TEETH  Xerostomia (dry mouth) Your salivary glands may be in the filed of radiation.  Radiation may include all or part of your saliva glands.  This will cause your saliva to dry up and you will have a dry mouth.  The dry mouth will be for the rest of your life unless your radiation oncologist tells you otherwise.  Your saliva has many functions:  Saliva wets your tongue for speaking.  It coats your teeth and the inside of your mouth for easier movement.  It helps with chewing and swallowing food.  It helps clean away harmful acid and toxic products made by the germs in your mouth, therefore it helps prevent cavities.  It kills some germs in your mouth and helps to prevent gum disease.  It helps to carry flavor to your taste buds.  Once you have lost your saliva you will be at higher risk for tooth decay and gum disease.  What can be done to help improve your mouth when there's not enough saliva:  1.  Your dentist may give a prescription for Salagen.  It will not bring back all of your saliva but may bring back some of it.  Also your saliva may be thick and ropy or white and foamy. It will not feel like it use to feel.  2.  You will need to swish with water every time your mouth feels dry.  YOU CANNOT suck on any cough drops, mints, lemon drops, candy, vitamin C or any other products.  You cannot use anything other than water to make your mouth feel less dry.  If you want to drink anything else you have to drink it all at once and brush afterwards.  Be sure to discuss the details of your diet  habits with your dentist or hygienist.  Radiation caries: This is decay that happens very quickly once your mouth is very dry due to radiation therapy.  Normally cavities take six months to two years to become a problem.  When you have dry mouth cavities may take as little as eight weeks to cause you a problem.  This is why dental check ups every two months are necessary as long as you have a dry mouth. Radiation caries typically, but not always, start at your gum line where it is hard to see the cavity.  It is therefore also hard to fill these cavities adequately.  This high rate of cavities happens because your mouth no longer has saliva and therefore the acid made by the germs starts the decay process.  Whenever you eat anything the germs in your mouth change the food into acid.  The acid then burns a small hole in your tooth.  This small hole is the beginning of a cavity.  If this is not treated then it will grow bigger and become a cavity.  The way to avoid this hole getting bigger is to use fluoride every evening as prescribed by your dentist.  You have to make sure that your teeth are very clean before you use the fluoride.  This fluoride in turn will strengthen  your teeth and prepare them for another day of fighting acid.  If you develop radiation caries many times the damage is so large that you will have to have all your teeth removed.  This could be a big problem if some of these teeth are in the field of radiation.  Further details of why this could be a big problem will follow.  (See Osteoradionecrosis).  Loss of taste (dysgeusia) This happens to varying degrees once you've had radiation therapy to your jaw region.  Many times taste is not completely lost but becomes limited.  The loss of taste is mostly due to radiation affecting your taste buds.  However if you have no saliva in your mouth to carry the flavor to your taste buds it would be difficult for your taste buds to taste anything.  That  is why using water or a prescription for Salagen prior to meals and during meals may help with some of the taste.  Keep in mind that taste generally returns very slowly over the course of several months or several years after radiation therapy.  Don't give up hope.  Trismus According to your Radiation Oncologist your TMJ or jaw joints are going to be partially or fully in the field of radiation.  This means that over time the muscles that help you open and close your mouth may get stiff.  This will potentially result in your not being able to open your mouth wide enough or as wide as you can open it now.  Le me give you an example of how slowly this happens and how unaware people are of it.  A gentlemen that had radiation therapy two years ago came back to me complaining that bananas are just too large for him to be able to fit them in between his teeth.  He was not able to open wide enough to bite into a banana.  This happens slowly and over a period of time.  What do we do to try and prevent this?  Your dentist will probably give you a stack of sticks called a trismus exercise device .  This stack will help your remind your muscles and your jaw joint to open up to the same distance every day.  Use these sticks every morning when you wake up according to the instructions given by the dentist.   You must use these sticks for at least one to two years after radiation therapy.  The reason for that is because it happens so slowly and keeps going on for about two years after radiation therapy.  Your hospital dentist will help you monitor your mouth opening and make sure that it's not getting smaller.  Osteoradionecrosis (ORN) This is a condition where your jaw bone after having had radiation therapy becomes very dry.  It has very little blood supply to keep it alive.  If you develop a cavity that turns into an abscess or an infection then the jaw bone does not have enough blood supply to help fight the  infection.  At this point it is very likely that the infection could cause the death of your jaw bone.  When you have dead bone it has to be removed.  Therefore you might end up having to have surgery to remove part of your jaw bone, the part of the jaw bone that has been affected.   Healing is also a problem if you are to have surgery in the areas where the bone has had  radiation therapy.  The same reasons apply.  If you have surgery you need more blood supply which is not available.  When blood supply and oxygen are not available again, there is a chance for the bone to die.  Occasionally ORN happens on its own with no obvious reason.  This is quite rare.  We believe that patients who continue to smoke and/or drink alcohol have a higher chance of having this bone problem.  Therefore once your jaw bone has had radiation therapy if there are any teeth in that area, you should never have them pulled.  You should also never have any surgery on your teeth or gums in that area unless the oral surgeon or Periodontist is aware of your history of radiation. There is some expensive management techniques that might be used to limit your risks.  The risks for ORN either from infection or spontaneous ( or on it's own) are life long.    TRISMUS  Trismus is a condition where the jaw does not allow the mouth to open as wide as it usually does.  This can happen almost suddenly, or in other cases the process is so slow, it is hard to notice it-until it is too far along.  When the jaw joints and/or muscles have been exposed to radiation treatments, the onset of Trismus is very slow.  This is because the muscles are losing their stretching ability over a long period of time, as long as 2 YEARS after the end of radiation.  It is therefore important to exercise these muscles and joints.  TRISMUS EXERCISES   Stack of tongue depressors measuring the same or a little less than the last documented MIO (Maximum Interincisal  Opening).  Secure them with a rubber band on both ends.  Place the stack in the patient's mouth, supporting the other end.  Allow 30 seconds for muscle stretching.  Rest for a few seconds.  Repeat 3-5 times  For all radiation patients, this exercise is recommended in the mornings and evenings unless otherwise instructed.  The exercise should be done for a period of 2 YEARS after the end of radiation.  MIO should be checked routinely on recall dental visits by the general dentist or the hospital dentist.  The patient is advised to report any changes, soreness, or difficulties encountered when doing the exercises.

## 2014-11-24 ENCOUNTER — Encounter (HOSPITAL_COMMUNITY): Payer: Self-pay | Admitting: Oncology

## 2014-11-24 ENCOUNTER — Encounter (HOSPITAL_COMMUNITY): Payer: Self-pay

## 2014-11-24 ENCOUNTER — Encounter (HOSPITAL_BASED_OUTPATIENT_CLINIC_OR_DEPARTMENT_OTHER): Payer: Medicare HMO | Admitting: Oncology

## 2014-11-24 VITALS — BP 98/54 | HR 68 | Temp 98.3°F | Resp 18 | Wt 146.8 lb

## 2014-11-24 DIAGNOSIS — C109 Malignant neoplasm of oropharynx, unspecified: Secondary | ICD-10-CM | POA: Diagnosis not present

## 2014-11-24 DIAGNOSIS — C01 Malignant neoplasm of base of tongue: Secondary | ICD-10-CM | POA: Diagnosis not present

## 2014-11-24 LAB — CBC WITH DIFFERENTIAL/PLATELET
BASOS ABS: 0 10*3/uL (ref 0.0–0.1)
BASOS PCT: 0 % (ref 0–1)
EOS ABS: 0.1 10*3/uL (ref 0.0–0.7)
Eosinophils Relative: 3 % (ref 0–5)
HEMATOCRIT: 30.8 % — AB (ref 39.0–52.0)
Hemoglobin: 10.2 g/dL — ABNORMAL LOW (ref 13.0–17.0)
Lymphocytes Relative: 19 % (ref 12–46)
Lymphs Abs: 0.7 10*3/uL (ref 0.7–4.0)
MCH: 31.3 pg (ref 26.0–34.0)
MCHC: 33.1 g/dL (ref 30.0–36.0)
MCV: 94.5 fL (ref 78.0–100.0)
MONO ABS: 0.5 10*3/uL (ref 0.1–1.0)
Monocytes Relative: 14 % — ABNORMAL HIGH (ref 3–12)
NEUTROS ABS: 2.3 10*3/uL (ref 1.7–7.7)
Neutrophils Relative %: 64 % (ref 43–77)
PLATELETS: 192 10*3/uL (ref 150–400)
RBC: 3.26 MIL/uL — ABNORMAL LOW (ref 4.22–5.81)
RDW: 16.5 % — AB (ref 11.5–15.5)
WBC: 3.6 10*3/uL — ABNORMAL LOW (ref 4.0–10.5)

## 2014-11-24 LAB — COMPREHENSIVE METABOLIC PANEL
ALBUMIN: 3.9 g/dL (ref 3.5–5.0)
ALT: 24 U/L (ref 17–63)
ANION GAP: 8 (ref 5–15)
AST: 21 U/L (ref 15–41)
Alkaline Phosphatase: 52 U/L (ref 38–126)
BILIRUBIN TOTAL: 0.4 mg/dL (ref 0.3–1.2)
BUN: 29 mg/dL — AB (ref 6–20)
CHLORIDE: 103 mmol/L (ref 101–111)
CO2: 27 mmol/L (ref 22–32)
Calcium: 9.2 mg/dL (ref 8.9–10.3)
Creatinine, Ser: 1.71 mg/dL — ABNORMAL HIGH (ref 0.61–1.24)
GFR calc Af Amer: 47 mL/min — ABNORMAL LOW (ref >60)
GFR calc non Af Amer: 40 mL/min — ABNORMAL LOW (ref >60)
GLUCOSE: 88 mg/dL (ref 65–99)
POTASSIUM: 4.5 mmol/L (ref 3.5–5.1)
SODIUM: 138 mmol/L (ref 135–145)
Total Protein: 7.1 g/dL (ref 6.5–8.1)

## 2014-11-24 MED ORDER — OXYCODONE-ACETAMINOPHEN 10-325 MG PO TABS: 1.0000 | ORAL_TABLET | Freq: Four times a day (QID) | ORAL | Status: DC | PRN

## 2014-11-24 NOTE — Progress Notes (Signed)
Alexander [name]'s reason for visit today is for labs as scheduled per MD orders.  Venipuncture performed with a 23 gauge butterfly needle to R Antecubital.  Alexander [name] tolerated procedure well and without incident; questions were answered and patient was discharged.

## 2014-11-24 NOTE — Patient Instructions (Addendum)
[place] at [facility] Discharge Instructions  RECOMMENDATIONS MADE BY THE CONSULTANT AND ANY TEST RESULTS WILL BE SENT TO YOUR REFERRING PHYSICIAN.  Exam completed by Kirby Crigler Lab work today Oxycodone refill today Return to see the doctor in 3 weeks Please call the clinic if you have any questions or concerns  Thank you for choosing [place] at [facility] to provide your oncology and hematology care.  To afford each patient quality time with our provider, please arrive at least 15 minutes before your scheduled appointment time.    You need to re-schedule your appointment should you arrive 10 or more minutes late.  We strive to give you quality time with our providers, and arriving late affects you and other patients whose appointments are after yours.  Also, if you no show three or more times for appointments you may be dismissed from the clinic at the providers discretion.     Again, thank you for choosing [facility].  Our hope is that these requests will decrease the amount of time that you wait before being seen by our physicians.       _____________________________________________________________  Should you have questions after your visit to [facility], please contact our office at [phone] between the hours of 8:30 a.m. and 4:30 p.m.  Voicemails left after 4:30 p.m. will not be returned until the following business day.  For prescription refill requests, have your pharmacy contact our office.

## 2014-11-24 NOTE — Assessment & Plan Note (Signed)
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 S/P Cisplatin/XRT with curative intent.  Will start to taper pain medication prescription.  Percocet #110 provided today.  This is a 3 week supply.  He must make this last 3 weeks.  If successful, will decrease to #90 and this will be a 1 month supply.  This will be followed in a decreasing fashion in the following manner: #75, #60, #45, #30, #15, #10, 0 (all of which will be a 1 month supply).  He reports severe itching with MS Contin.  Therefore, if I have difficulty weaning his narcotic need, then I will consider adding a long-acting versus referring to pain clinic versus methadone clinic.  He is scheduled for PET scan in October.  He has follow-up with Dr. Enrique Sack.  Return in 3 weeks for narcotic refill (#90- 1 month supply).

## 2014-11-24 NOTE — Progress Notes (Signed)
No PCP Per Patient No address on file  Oropharyngeal cancer - Plan: lidocaine (XYLOCAINE) 2 % solution, oxyCODONE-acetaminophen (PERCOCET) 10-325 MG per tablet, CBC with Differential, Comprehensive metabolic panel, DISCONTINUED: oxyCODONE-acetaminophen (PERCOCET) 10-325 MG per tablet  CURRENT THERAPY: S/P Cisplatin/XRT with curative intent.  INTERVAL HISTORY: Alexander [name] 64 y.o. 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     Oropharyngeal cancer   07/27/2014 Imaging CT neck- Advanced stage oropharyngeal cancer with necrotic adenopathy accounting for the left neck swelling.   07/28/2014 Initial Diagnosis Oropharyngeal cancer   08/03/2014 Imaging CT CAP- L supraclavicular lymphadenopathy is not completely visualized. This is better seen on the previous neck CT from 07/27/2014. Otherwise, no evidence for metastatic disease in the chest, abdomen, or pelvis.   08/03/2014 Imaging Bone scan- Uptake at adjacent anterior LEFT 6, 7, 8 ribs likely representing trauma/fractures. Questionable nonspecific increased tracer localization at the posterior RIGHT 8th and 9th ribs, the adjacent nature which raises a a question of trauma as well   08/06/2014 Pathology Results Dr. Benjamine Mola- Oropharynx, biopsy, Left - INVASIVE SQUAMOUS CELL CARCINOMA.   08/12/2014 Procedure Dr. Enrique Sack- 1. Multiple extraction of tooth numbers 6, 17, 22, 23, 24, 25, 26, and 27. 3 Quadrants of alveoloplasty   09/01/2014 - 10/23/2014 Chemotherapy Cisplatin/XRT   I have reviewed Rad Onc note.  Together we will work on decreasing the patient narcotic needs.    Alexander [name] reports that he stopped taking his MS Contin because it made him itch.  I had the option to switch him to another long-acting narcotic in an attempt to decrease his short-acting needs, but based upon his description of pain, I believe I can taper his short acting and get him off pain medications in a reasonable timeframe.  If  difficulty arises, I would not hesitate to re-institute a long-acting pain medication.  He reports that his pain occurs following consumption of large meals.  "My throat feels raw."  Otherwise, he denies any pain.  He reports that he take 1-2 tablets every 6 hours which coincides with his eating habits.  He was given #96 of Percocet on 8/16.  He reports that he is out of this medication and requests a refill.  In no confusing way, I explained to the patient that we will start to taper his pain medications.  He is agreeable to this plan.    He denies any complaints.  He continues to use his PEG for Jevity consumption.  Otherwise, he is taking most PO.    He notes that he feels great.  He denies any complaints.  Past Medical History  Diagnosis Date  . Oropharyngeal cancer 07/28/2014    dx. 3 weeks ago.- Dr. [facility] [place], [place].  . 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     has No Known Allergies.  Current Outpatient Prescriptions on File Prior to Visit  Medication Sig Dispense Refill  . Diphenhyd-Hydrocort-Nystatin (FIRST-DUKES MOUTHWASH) SUSP Use as directed 5 mLs in the mouth or throat 4 (four) times daily as needed. 300 mL 1  . megestrol (MEGACE) 400 MG/10ML suspension Place 10 mLs (400 mg total) into feeding tube daily. 240 mL 0  . Nutritional  Supplements (JEVITY 1.2 CAL/FIBER) LIQD 7 cans Jevity 1.2 split into 3 feeds of 2-3-2 cans. Flush 100 mls before/after each feed.  kcals ,92 g Pro, Fluid: 1337 + 600 mls from flushes    . polyethylene glycol (MIRALAX / GLYCOLAX) packet Take 17 g by mouth daily.    . SPS 15 GM/60ML suspension     . morphine (MS CONTIN) 30 MG 12 hr tablet Take 1 tablet (30 mg total) by mouth every 12 (twelve) hours. (Patient not taking:  Reported on 11/24/2014) 60 tablet 0   No current facility-administered medications on file prior to visit.    Past Surgical History  Procedure Laterality Date  . Panendoscopy N/A 08/06/2014    Procedure: PANENDOSCOPY WITH BIOPSY;  Surgeon: Leta Baptist, MD;  Location: [facility];  Service: ENT;  Laterality: N/A;  . Multiple extractions with alveoloplasty N/A 08/12/2014    Procedure: Extraction of tooth #'s 6,17,22,23,24,25,26,27 with alveoloplasty;  Surgeon: Lenn Cal, DDS;  Location: [facility];  Service: Oral Surgery;  Laterality: N/A;  . Peg placement Left 08/17/14  . Portacath placement Right 08/17/14  . Portacath placement Right 08/17/2014    Procedure: INSERTION PORT-A-CATH (procedure #2);  Surgeon: Aviva Signs Md, MD;  Location: [facility];  Service: General;  Laterality: Right;  . Peg placement N/A 08/17/2014    Procedure: PERCUTANEOUS ENDOSCOPIC GASTROSTOMY (PEG) PLACEMENT (procedure #1);  Surgeon: Aviva Signs Md, MD;  Location: [facility];  Service: General;  Laterality: N/A;  . Esophagogastroduodenoscopy (egd) with propofol N/A 08/17/2014    Procedure: ESOPHAGOGASTRODUODENOSCOPY (EGD) WITH PROPOFOL (procedure #1);  Surgeon: Aviva Signs Md, MD;  Location: [facility];  Service: General;  Laterality: N/A;    Denies any headaches, dizziness, double vision, fevers, chills, night sweats, nausea, vomiting, diarrhea, constipation, chest pain, heart palpitations, shortness of breath, blood in stool, black tarry stool, urinary pain, urinary burning, urinary frequency, hematuria.   PHYSICAL EXAMINATION  ECOG PERFORMANCE STATUS: 1 - Symptomatic but completely ambulatory  Filed Vitals:   11/24/14 1100  BP: 98/54  Pulse: 68  Temp: 98.3 F (36.8 C)  Resp: 18    GENERAL:alert, no distress, cachectic, comfortable, cooperative, smiling and unacommpanied SKIN: skin color, texture, turgor are normal, no rashes or significant lesions HEAD: Normocephalic, No masses, lesions, tenderness or  abnormalities EYES: normal, PERRLA, EOMI, Conjunctiva are pink and non-injected EARS: External ears normal OROPHARYNX:thrush  NECK: left neck thickening measuring 2-3 cm at the site of large left neck mass in past which is now softer and mobile.  Darkening of skin at radiation sites. BREAST:not examined LUNGS: clear to auscultation  HEART: regular rate & rhythm, no murmurs and no gallops ABDOMEN:abdomen soft and normal bowel sounds BACK: Back symmetric, no curvature. EXTREMITIES:less then 2 second capillary refill, no joint deformities, effusion, or inflammation, no skin discoloration  NEURO: alert & oriented x 3 with fluent speech, no focal motor/sensory deficits, gait normal  LABORATORY DATA: CBC    Component Value Date/Time   WBC 3.6* 11/24/2014 1231   RBC 3.26* 11/24/2014 1231   HGB 10.2* 11/24/2014 1231   HCT 30.8* 11/24/2014 1231   PLT 192 11/24/2014 1231   MCV 94.5 11/24/2014 1231   MCH 31.3 11/24/2014 1231   MCHC 33.1 11/24/2014 1231   RDW 16.5* 11/24/2014 1231   LYMPHSABS 0.7 11/24/2014 1231   MONOABS 0.5 11/24/2014 1231   EOSABS 0.1 11/24/2014 1231   BASOSABS 0.0 11/24/2014 1231      Chemistry      Component Value Date/Time  NA 138 11/24/2014 1231   K 4.5 11/24/2014 1231   CL 103 11/24/2014 1231   CO2 27 11/24/2014 1231   BUN 29* 11/24/2014 1231   CREATININE 1.71* 11/24/2014 1231      Component Value Date/Time   CALCIUM 9.2 11/24/2014 1231   ALKPHOS 52 11/24/2014 1231   AST 21 11/24/2014 1231   ALT 24 11/24/2014 1231   BILITOT 0.4 11/24/2014 1231        PENDING LABS:   RADIOGRAPHIC STUDIES:  No results found.   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 S/P Cisplatin/XRT with curative intent.  Will start to taper pain medication prescription.  Percocet #110 provided today.  This is a 3 week supply.  He must make this last 3 weeks.  If successful, will decrease to #90  and this will be a 1 month supply.  This will be followed in a decreasing fashion in the following manner: #75, #60, #45, #30, #15, #10, 0 (all of which will be a 1 month supply).  He reports severe itching with MS Contin.  Therefore, if I have difficulty weaning his narcotic need, then I will consider adding a long-acting versus referring to pain clinic versus methadone clinic.  He is scheduled for PET scan in October.  He has follow-up with Dr. Enrique Sack.  Return in 3 weeks for narcotic refill (#90- 1 month supply).    THERAPY PLAN:  As noted above.  Removal of PEG tube can be considered in the near future.  All questions were answered. The patient knows to call the clinic with any problems, questions or concerns. We can certainly see the patient much sooner if necessary.  Patient and plan discussed with Dr. Ancil Linsey and she is in agreement with the aforementioned.   This note is electronically signed by: Robynn Pane, PA-C 11/24/2014 4:04 PM

## 2014-12-05 ENCOUNTER — Encounter (HOSPITAL_COMMUNITY): Payer: Self-pay | Admitting: Hematology & Oncology

## 2014-12-06 ENCOUNTER — Encounter (HOSPITAL_COMMUNITY): Payer: Self-pay | Admitting: Hematology & Oncology

## 2014-12-15 ENCOUNTER — Encounter (HOSPITAL_COMMUNITY): Payer: Medicare HMO | Attending: Oncology | Admitting: Oncology

## 2014-12-15 ENCOUNTER — Encounter (HOSPITAL_COMMUNITY): Payer: Medicare HMO

## 2014-12-15 ENCOUNTER — Encounter (HOSPITAL_COMMUNITY): Payer: Self-pay | Admitting: Oncology

## 2014-12-15 VITALS — BP 130/68 | HR 59 | Temp 98.4°F | Resp 16

## 2014-12-15 DIAGNOSIS — C109 Malignant neoplasm of oropharynx, unspecified: Secondary | ICD-10-CM | POA: Insufficient documentation

## 2014-12-15 DIAGNOSIS — C01 Malignant neoplasm of base of tongue: Secondary | ICD-10-CM | POA: Diagnosis present

## 2014-12-15 DIAGNOSIS — Z9889 Other specified postprocedural states: Secondary | ICD-10-CM | POA: Diagnosis present

## 2014-12-15 LAB — CBC WITH DIFFERENTIAL/PLATELET
BASOS ABS: 0 10*3/uL (ref 0.0–0.1)
Basophils Relative: 0 %
EOS ABS: 0.2 10*3/uL (ref 0.0–0.7)
EOS PCT: 4 %
HCT: 30.3 % — ABNORMAL LOW (ref 39.0–52.0)
HEMOGLOBIN: 10 g/dL — AB (ref 13.0–17.0)
LYMPHS ABS: 0.7 10*3/uL (ref 0.7–4.0)
Lymphocytes Relative: 15 %
MCH: 31.2 pg (ref 26.0–34.0)
MCHC: 33 g/dL (ref 30.0–36.0)
MCV: 94.4 fL (ref 78.0–100.0)
Monocytes Absolute: 0.5 10*3/uL (ref 0.1–1.0)
Monocytes Relative: 11 %
NEUTROS PCT: 70 %
Neutro Abs: 3.1 10*3/uL (ref 1.7–7.7)
PLATELETS: 198 10*3/uL (ref 150–400)
RBC: 3.21 MIL/uL — AB (ref 4.22–5.81)
RDW: 15.8 % — ABNORMAL HIGH (ref 11.5–15.5)
WBC: 4.5 10*3/uL (ref 4.0–10.5)

## 2014-12-15 LAB — COMPREHENSIVE METABOLIC PANEL
ALBUMIN: 3.9 g/dL (ref 3.5–5.0)
ALK PHOS: 37 U/L — AB (ref 38–126)
ALT: 12 U/L — AB (ref 17–63)
AST: 17 U/L (ref 15–41)
Anion gap: 7 (ref 5–15)
BUN: 34 mg/dL — AB (ref 6–20)
CALCIUM: 9 mg/dL (ref 8.9–10.3)
CHLORIDE: 109 mmol/L (ref 101–111)
CO2: 23 mmol/L (ref 22–32)
CREATININE: 1.85 mg/dL — AB (ref 0.61–1.24)
GFR calc non Af Amer: 37 mL/min — ABNORMAL LOW (ref >60)
GFR, EST AFRICAN AMERICAN: 42 mL/min — AB (ref >60)
GLUCOSE: 102 mg/dL — AB (ref 65–99)
Potassium: 4.9 mmol/L (ref 3.5–5.1)
SODIUM: 139 mmol/L (ref 135–145)
Total Bilirubin: 0.5 mg/dL (ref 0.3–1.2)
Total Protein: 7.2 g/dL (ref 6.5–8.1)

## 2014-12-15 MED ORDER — HEPARIN SOD (PORK) LOCK FLUSH 100 UNIT/ML IV SOLN
500.0000 [IU] | Freq: Once | INTRAVENOUS | Status: AC
  Administered 2014-12-15: 500 [IU] via INTRAVENOUS
  Filled 2014-12-15: qty 5

## 2014-12-15 MED ORDER — SODIUM CHLORIDE 0.9 % IJ SOLN
10.0000 mL | INTRAMUSCULAR | Status: DC | PRN
  Administered 2014-12-15: 10 mL via INTRAVENOUS
  Filled 2014-12-15: qty 10

## 2014-12-15 MED ORDER — OXYCODONE-ACETAMINOPHEN 10-325 MG PO TABS: 1.0000 | ORAL_TABLET | Freq: Four times a day (QID) | ORAL | Status: DC | PRN

## 2014-12-15 NOTE — Assessment & Plan Note (Signed)
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 S/P Cisplatin/XRT with curative intent.  Will start to taper pain medication prescription.  Percocet #110 provided today.  This is a 4 week supply (comparefd to a three week supply on his last Rx).  He must make this last 4 weeks.  If successful, will decrease to #90 and this will be a 1 month supply.  This will be followed in a decreasing fashion in the following manner: #75, #60, #45, #30, #15, #10, 0 (all of which will be a 1 month supply).  He reports severe itching with MS Contin.  Therefore, if I have difficulty weaning his narcotic need, then I will consider adding a long-acting versus referring to pain clinic versus methadone clinic.  He is agreeable to this plan.  He is scheduled for PET scan on 10/31.  He has follow-up with Dr. Enrique Sack.  Labs today with port flush: CBC diff, CMET  Labs in 4 weeks: CBC diff, CMET  Rx for Oxycodone #110 (1 month supply)  Return in 4 weeks for follow-up and refill of pain medication.

## 2014-12-15 NOTE — Progress Notes (Signed)
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  INTERVAL HISTORY: Alexander [name] 64 y.o. 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     Oropharyngeal cancer   07/27/2014 Imaging CT neck- Advanced stage oropharyngeal cancer with necrotic adenopathy accounting for the left neck swelling.   07/28/2014 Initial Diagnosis Oropharyngeal cancer   08/03/2014 Imaging CT CAP- L supraclavicular lymphadenopathy is not completely visualized. This is better seen on the previous neck CT from 07/27/2014. Otherwise, no evidence for metastatic disease in the chest, abdomen, or pelvis.   08/03/2014 Imaging Bone scan- Uptake at adjacent anterior LEFT 6, 7, 8 ribs likely representing trauma/fractures. Questionable nonspecific increased tracer localization at the posterior RIGHT 8th and 9th ribs, the adjacent nature which raises a a question of trauma as well   08/06/2014 Pathology Results Dr. Benjamine Mola- Oropharynx, biopsy, Left - INVASIVE SQUAMOUS CELL CARCINOMA.   08/12/2014 Procedure Dr. Enrique Sack- 1. Multiple extraction of tooth numbers 6, 17, 22, 23, 24, 25, 26, and 27. 3 Quadrants of alveoloplasty   09/01/2014 - 10/23/2014 Chemotherapy Cisplatin/XRT   I have reviewed Rad Onc note.  Together we will work on decreasing the patient narcotic needs.    His weight is up 4 lbs and he weighs in at 150 lbs.  He notes  that he is taking 50% by mouth and 50% by PEG.  He notes that he has an upcoming appointment with Dr. Enrique Sack as he prepares for dentures.  He notes this will help with his PO food intake because he has difficulty with meats and other food products that require mastication for ease of swallowing.  He is excited for this.  He notes that his appetite is significantly improved.  He notes that his pain is located in the throat when he eats.  He is using Lidocaine to help with discomfort, "but it wears off quickly."  I provided him some helpful hints regarding Lidocaine usage.  Past Medical History  Diagnosis Date  . Oropharyngeal cancer 07/28/2014    dx. 3 weeks ago.- Dr. Oneal Deputy center [place], [place].  . 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     has No Known Allergies.  Current Outpatient Prescriptions on File Prior to Visit  Medication Sig Dispense Refill  . lidocaine (XYLOCAINE) 2 % solution Use as directed 20 mLs in the mouth or throat as needed for mouth pain.    . megestrol (MEGACE) 400 MG/10ML suspension Place 10 mLs (400 mg total) into feeding tube daily. 240 mL 0  . Nutritional Supplements (JEVITY 1.2 CAL/FIBER) LIQD 7 cans Jevity 1.2 split into 3 feeds of 2-3-2 cans. Flush 100 mls before/after each feed.  kcals ,92 g Pro, Fluid: 1337 + 600 mls from  flushes    . polyethylene glycol (MIRALAX / GLYCOLAX) packet Take 17 g by mouth daily.    . SPS 15 GM/60ML suspension     . Diphenhyd-Hydrocort-Nystatin (FIRST-DUKES MOUTHWASH) SUSP Use as directed 5 mLs in the mouth or throat 4 (four) times daily as needed. (Patient not taking: Reported on 12/15/2014) 300 mL 1   No current facility-administered medications on file prior to visit.    Past Surgical History  Procedure  Laterality Date  . Panendoscopy N/A 08/06/2014    Procedure: PANENDOSCOPY WITH BIOPSY;  Surgeon: Leta Baptist, MD;  Location: [place];  Service: ENT;  Laterality: N/A;  . Multiple extractions with alveoloplasty N/A 08/12/2014    Procedure: Extraction of tooth #'s 6,17,22,23,24,25,26,27 with alveoloplasty;  Surgeon: Lenn Cal, DDS;  Location: [facility];  Service: Oral Surgery;  Laterality: N/A;  . Peg placement Left 08/17/14  . Portacath placement Right 08/17/14  . Portacath placement Right 08/17/2014    Procedure: INSERTION PORT-A-CATH (procedure #2);  Surgeon: Aviva Signs Md, MD;  Location: [facility];  Service: General;  Laterality: Right;  . Peg placement N/A 08/17/2014    Procedure: PERCUTANEOUS ENDOSCOPIC GASTROSTOMY (PEG) PLACEMENT (procedure #1);  Surgeon: Aviva Signs Md, MD;  Location: [facility];  Service: General;  Laterality: N/A;  . Esophagogastroduodenoscopy (egd) with propofol N/A 08/17/2014    Procedure: ESOPHAGOGASTRODUODENOSCOPY (EGD) WITH PROPOFOL (procedure #1);  Surgeon: Aviva Signs Md, MD;  Location: [facility];  Service: General;  Laterality: N/A;    Denies any headaches, dizziness, double vision, fevers, chills, night sweats, nausea, vomiting, diarrhea, constipation, chest pain, heart palpitations, shortness of breath, blood in stool, black tarry stool, urinary pain, urinary burning, urinary frequency, hematuria.   PHYSICAL EXAMINATION  ECOG PERFORMANCE STATUS: 1 - Symptomatic but completely ambulatory  Filed Vitals:   12/15/14 1320  BP: 130/68  Pulse: 59  Temp: 98.4 F (36.9 C)  Resp: 16    GENERAL:alert, no distress, cachectic, comfortable, cooperative, smiling and unacommpanied SKIN: skin color, texture, turgor are normal, no rashes or significant lesions HEAD: Normocephalic, No masses, lesions, tenderness or abnormalities EYES: normal, PERRLA, EOMI, Conjunctiva are pink and non-injected EARS: External ears normal OROPHARYNX: moist NECK: left neck  thickening measuring 2-3 cm at the site of large left neck mass in past which is now softer and mobile.  Darkening of skin at radiation sites. BREAST:not examined LUNGS: clear to auscultation  HEART: regular rate & rhythm, no murmurs and no gallops ABDOMEN:abdomen soft and normal bowel sounds BACK: Back symmetric, no curvature. EXTREMITIES:less then 2 second capillary refill, no joint deformities, effusion, or inflammation, no skin discoloration  NEURO: alert & oriented x 3 with fluent speech, no focal motor/sensory deficits, gait normal  LABORATORY DATA: CBC    Component Value Date/Time   WBC 3.6* 11/24/2014 1231   RBC 3.26* 11/24/2014 1231   HGB 10.2* 11/24/2014 1231   HCT 30.8* 11/24/2014 1231   PLT 192 11/24/2014 1231   MCV 94.5 11/24/2014 1231   MCH 31.3 11/24/2014 1231   MCHC 33.1 11/24/2014 1231   RDW 16.5* 11/24/2014 1231   LYMPHSABS 0.7 11/24/2014 1231   MONOABS 0.5 11/24/2014 1231   EOSABS 0.1 11/24/2014 1231   BASOSABS 0.0 11/24/2014 1231      Chemistry      Component Value Date/Time   NA 138 11/24/2014 1231   K 4.5 11/24/2014 1231   CL 103 11/24/2014 1231   CO2 27 11/24/2014 1231   BUN 29* 11/24/2014 1231   CREATININE  1.71* 11/24/2014 1231      Component Value Date/Time   CALCIUM 9.2 11/24/2014 1231   ALKPHOS 52 11/24/2014 1231   AST 21 11/24/2014 1231   ALT 24 11/24/2014 1231   BILITOT 0.4 11/24/2014 1231        PENDING LABS:   RADIOGRAPHIC STUDIES:  No results found.   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 S/P Cisplatin/XRT with curative intent.  Will start to taper pain medication prescription.  Percocet #110 provided today.  This is a 4 week supply (comparefd to a three week supply on his last Rx).  He must make this last 4 weeks.  If successful, will decrease to #90 and this will be a 1 month supply.  This will be followed in a decreasing fashion in the  following manner: #75, #60, #45, #30, #15, #10, 0 (all of which will be a 1 month supply).  He reports severe itching with MS Contin.  Therefore, if I have difficulty weaning his narcotic need, then I will consider adding a long-acting versus referring to pain clinic versus methadone clinic.  He is agreeable to this plan.  He is scheduled for PET scan on 10/31.  He has follow-up with Dr. Enrique Sack.  Labs today with port flush: CBC diff, CMET  Labs in 4 weeks: CBC diff, CMET  Rx for Oxycodone #110 (1 month supply)  Return in 4 weeks for follow-up and refill of pain medication.    THERAPY PLAN:  Continue observation.  All questions were answered. The patient knows to call the clinic with any problems, questions or concerns. We can certainly see the patient much sooner if necessary.  Patient and plan discussed with Dr. Ancil Linsey and she is in agreement with the aforementioned.   This note is electronically signed by: Doy Mince 12/15/2014 2:03 PM

## 2014-12-15 NOTE — Progress Notes (Signed)
Alexander [name] presented for labwork. Labs per MD order drawn via Portacath located in the right chest wall accessed with  H 20 needle. Good blood return present. Procedure without incident.  Needle removed intact. Patient tolerated procedure well.

## 2014-12-15 NOTE — Patient Instructions (Addendum)
[place] at [facility] Discharge Instructions  RECOMMENDATIONS MADE BY THE CONSULTANT AND ANY TEST RESULTS WILL BE SENT TO YOUR REFERRING PHYSICIAN.  Exam and discussion by Robynn Pane, PA-C. You are doing well. PET scan as scheduled Port flush with labs today Call with any concerns.  Follow-up in 4 weeks with port flush, labs and office visit.  Thank you for choosing [place] at [facility] to provide your oncology and hematology care.  To afford each patient quality time with our Ylianna Almanzar, please arrive at least 15 minutes before your scheduled appointment time.    You need to re-schedule your appointment should you arrive 10 or more minutes late.  We strive to give you quality time with our providers, and arriving late affects you and other patients whose appointments are after yours.  Also, if you no show three or more times for appointments you may be dismissed from the clinic at the providers discretion.     Again, thank you for choosing [facility].  Our hope is that these requests will decrease the amount of time that you wait before being seen by our physicians.       _____________________________________________________________  Should you have questions after your visit to [facility], please contact our office at [phone] between the hours of 8:30 a.m. and 4:30 p.m.  Voicemails left after 4:30 p.m. will not be returned until the following business day.  For prescription refill requests, have your pharmacy contact our office.

## 2015-01-12 ENCOUNTER — Encounter (HOSPITAL_COMMUNITY): Payer: Self-pay | Admitting: Hematology & Oncology

## 2015-01-12 ENCOUNTER — Encounter (HOSPITAL_COMMUNITY): Payer: Medicare HMO | Attending: Oncology | Admitting: Hematology & Oncology

## 2015-01-12 ENCOUNTER — Encounter (HOSPITAL_COMMUNITY): Payer: Medicare HMO

## 2015-01-12 VITALS — BP 104/72 | HR 61 | Temp 98.2°F | Resp 16 | Wt 147.4 lb

## 2015-01-12 DIAGNOSIS — D649 Anemia, unspecified: Secondary | ICD-10-CM | POA: Diagnosis not present

## 2015-01-12 DIAGNOSIS — C01 Malignant neoplasm of base of tongue: Secondary | ICD-10-CM | POA: Diagnosis not present

## 2015-01-12 DIAGNOSIS — N289 Disorder of kidney and ureter, unspecified: Secondary | ICD-10-CM

## 2015-01-12 DIAGNOSIS — R52 Pain, unspecified: Secondary | ICD-10-CM | POA: Diagnosis not present

## 2015-01-12 DIAGNOSIS — C109 Malignant neoplasm of oropharynx, unspecified: Secondary | ICD-10-CM | POA: Insufficient documentation

## 2015-01-12 DIAGNOSIS — Z9889 Other specified postprocedural states: Secondary | ICD-10-CM | POA: Insufficient documentation

## 2015-01-12 DIAGNOSIS — R634 Abnormal weight loss: Secondary | ICD-10-CM

## 2015-01-12 LAB — CBC WITH DIFFERENTIAL/PLATELET
Basophils Absolute: 0 10*3/uL (ref 0.0–0.1)
Basophils Relative: 0 %
EOS ABS: 0.1 10*3/uL (ref 0.0–0.7)
EOS PCT: 3 %
HCT: 31.5 % — ABNORMAL LOW (ref 39.0–52.0)
Hemoglobin: 10.4 g/dL — ABNORMAL LOW (ref 13.0–17.0)
LYMPHS ABS: 0.8 10*3/uL (ref 0.7–4.0)
Lymphocytes Relative: 22 %
MCH: 31.4 pg (ref 26.0–34.0)
MCHC: 33 g/dL (ref 30.0–36.0)
MCV: 95.2 fL (ref 78.0–100.0)
MONOS PCT: 12 %
Monocytes Absolute: 0.4 10*3/uL (ref 0.1–1.0)
Neutro Abs: 2.2 10*3/uL (ref 1.7–7.7)
Neutrophils Relative %: 63 %
PLATELETS: 193 10*3/uL (ref 150–400)
RBC: 3.31 MIL/uL — ABNORMAL LOW (ref 4.22–5.81)
RDW: 13.2 % (ref 11.5–15.5)
WBC: 3.5 10*3/uL — AB (ref 4.0–10.5)

## 2015-01-12 LAB — COMPREHENSIVE METABOLIC PANEL
ALT: 13 U/L — ABNORMAL LOW (ref 17–63)
ANION GAP: 8 (ref 5–15)
AST: 17 U/L (ref 15–41)
Albumin: 3.8 g/dL (ref 3.5–5.0)
Alkaline Phosphatase: 44 U/L (ref 38–126)
BUN: 25 mg/dL — ABNORMAL HIGH (ref 6–20)
CHLORIDE: 104 mmol/L (ref 101–111)
CO2: 24 mmol/L (ref 22–32)
Calcium: 9.3 mg/dL (ref 8.9–10.3)
Creatinine, Ser: 2.24 mg/dL — ABNORMAL HIGH (ref 0.61–1.24)
GFR calc non Af Amer: 29 mL/min — ABNORMAL LOW (ref >60)
GFR, EST AFRICAN AMERICAN: 34 mL/min — AB (ref >60)
Glucose, Bld: 94 mg/dL (ref 65–99)
POTASSIUM: 4.4 mmol/L (ref 3.5–5.1)
SODIUM: 136 mmol/L (ref 135–145)
Total Bilirubin: 0.4 mg/dL (ref 0.3–1.2)
Total Protein: 6.8 g/dL (ref 6.5–8.1)

## 2015-01-12 MED ORDER — HEPARIN SOD (PORK) LOCK FLUSH 100 UNIT/ML IV SOLN
500.0000 [IU] | Freq: Once | INTRAVENOUS | Status: AC
  Administered 2015-01-12: 500 [IU] via INTRAVENOUS
  Filled 2015-01-12: qty 5

## 2015-01-12 MED ORDER — OXYCODONE-ACETAMINOPHEN 10-325 MG PO TABS: 1.0000 | ORAL_TABLET | Freq: Four times a day (QID) | ORAL | Status: DC | PRN

## 2015-01-12 MED ORDER — MEGESTROL ACETATE 400 MG/10ML PO SUSP: 400.0000 mg | Freq: Every day | ORAL | Status: DC

## 2015-01-12 MED ORDER — SODIUM CHLORIDE 0.9 % IJ SOLN
10.0000 mL | INTRAMUSCULAR | Status: DC | PRN
  Administered 2015-01-12: 10 mL via INTRAVENOUS
  Filled 2015-01-12: qty 10

## 2015-01-12 NOTE — Patient Instructions (Signed)
[place] at [facility] Discharge Instructions  RECOMMENDATIONS MADE BY THE CONSULTANT AND ANY TEST RESULTS WILL BE SENT TO YOUR REFERRING PHYSICIAN.  Exam and discussion today with Dr. Whitney Muse. Port flush with lab work today. Return in two months for office visit and port flush. Call the clinic with any questions or concerns.  Thank you for choosing [place] at [facility] to provide your oncology and hematology care.  To afford each patient quality time with our provider, please arrive at least 15 minutes before your scheduled appointment time.    You need to re-schedule your appointment should you arrive 10 or more minutes late.  We strive to give you quality time with our providers, and arriving late affects you and other patients whose appointments are after yours.  Also, if you no show three or more times for appointments you may be dismissed from the clinic at the providers discretion.     Again, thank you for choosing [facility].  Our hope is that these requests will decrease the amount of time that you wait before being seen by our physicians.       _____________________________________________________________  Should you have questions after your visit to [facility], please contact our office at [phone] between the hours of 8:30 a.m. and 4:30 p.m.  Voicemails left after 4:30 p.m. will not be returned until the following business day.  For prescription refill requests, have your pharmacy contact our office.

## 2015-01-12 NOTE — Progress Notes (Signed)
Please see doctors encounter for more information 

## 2015-01-12 NOTE — Progress Notes (Signed)
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  Oropharyngeal cancer   Staging form: Pharynx - Oropharynx, AJCC 7th Edition     Clinical: Stage IVA (T4a, N2b, M0) - Unsigned    Oropharyngeal cancer ([place])   07/27/2014 Imaging CT neck- Advanced stage oropharyngeal cancer with necrotic adenopathy accounting for the left neck swelling.   07/28/2014 Initial Diagnosis Oropharyngeal cancer   08/03/2014 Imaging CT CAP- L supraclavicular lymphadenopathy is not completely visualized. This is better seen on the previous neck CT from 07/27/2014. Otherwise, no evidence for metastatic disease in the chest, abdomen, or pelvis.   08/03/2014 Imaging Bone scan- Uptake at adjacent anterior LEFT 6, 7, 8 ribs likely representing trauma/fractures. Questionable nonspecific increased tracer localization at the posterior RIGHT 8th and 9th ribs, the adjacent nature which raises a a question of trauma as well   08/06/2014 Pathology Results Dr. Benjamine Mola- Oropharynx, biopsy, Left - INVASIVE SQUAMOUS CELL CARCINOMA.   08/12/2014 Procedure Dr. Enrique Sack- 1. Multiple extraction of tooth numbers 6, 17, 22, 23, 24, 25, 26, and 27. 3 Quadrants of alveoloplasty   09/01/2014 - 10/23/2014 Chemotherapy Cisplatin/XRT     CURRENT THERAPY:  Observation  INTERVAL HISTORY: Alexander [name] 64 y.o. 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   The patient states that he feels great.  He notes that his tongue movement has improved.  He still experiences dry mouth and "scratchy throat" after eating.  He states that he is not limited to most food and he is eating regularly.  He notes that he is still using his feeding tube some.  Denies choking.  His weight is improving.  He carries around water along with his baking soda/salt solution.  He has a PET scan on 10/31 with Dr. Isidore Moos.  He has not had a flu shot yet this year.  He notes that it makes him  sick.    He states that he is very grateful and positive to have made it through treatment.  He is also excited about helping one of family members quit smoking.  He gets emotional during the office visit.   Past Medical History  Diagnosis Date  . Oropharyngeal cancer ([place]) 07/28/2014    dx. 3 weeks ago.- Dr. Oneal Deputy center [place], [place].  . 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     has No Known Allergies.  Current Outpatient Prescriptions on File Prior to Visit  Medication Sig Dispense Refill  . Diphenhyd-Hydrocort-Nystatin (FIRST-DUKES MOUTHWASH) SUSP Use as directed 5 mLs in the mouth or throat 4 (four) times daily as needed. 300 mL 1  . Nutritional Supplements (JEVITY 1.2 CAL/FIBER) LIQD 7 cans Jevity 1.2 split into 3 feeds of 2-3-2 cans. Flush 100 mls before/after each feed.  kcals ,92 g Pro, Fluid: 1337 + 600 mls from flushes    . lidocaine (XYLOCAINE) 2 % solution Use as directed 20 mLs in the mouth or throat as needed for mouth pain.    . polyethylene glycol (MIRALAX / GLYCOLAX) packet Take 17 g by mouth daily.    . SPS 15 GM/60ML suspension      No  current facility-administered medications on file prior to visit.    Past Surgical History  Procedure Laterality Date  . Panendoscopy N/A 08/06/2014    Procedure: PANENDOSCOPY WITH BIOPSY;  Surgeon: Leta Baptist, MD;  Location: [facility];  Service: ENT;  Laterality: N/A;  . Multiple extractions with alveoloplasty N/A 08/12/2014    Procedure: Extraction of tooth #'s 6,17,22,23,24,25,26,27 with alveoloplasty;  Surgeon: Lenn Cal, DDS;  Location[facility];  Service: Oral Surgery;  Laterality: N/A;  . Peg placement Left 08/17/14  . Portacath placement Right 08/17/14  . Portacath  placement Right 08/17/2014    Procedure: INSERTION PORT-A-CATH (procedure #2);  Surgeon: Aviva Signs Md, MD;  Location: [facility];  Service: General;  Laterality: Right;  . Peg placement N/A 08/17/2014    Procedure: PERCUTANEOUS ENDOSCOPIC GASTROSTOMY (PEG) PLACEMENT (procedure #1);  Surgeon: Aviva Signs Md, MD;  Location: [facility];  Service: General;  Laterality: N/A;  . Esophagogastroduodenoscopy (egd) with propofol N/A 08/17/2014    Procedure: ESOPHAGOGASTRODUODENOSCOPY (EGD) WITH PROPOFOL (procedure #1);  Surgeon: Aviva Signs Md, MD;  Location: [facility];  Service: General;  Laterality: N/A;    Denies any headaches, dizziness, double vision, fevers, chills, night sweats, nausea, vomiting, diarrhea, constipation, chest pain, heart palpitations, shortness of breath, blood in stool, black tarry stool, urinary pain, urinary burning, urinary frequency, hematuria. 14 point review of systems was performed and is negative except as detailed under history of present illness and above  PHYSICAL EXAMINATION  ECOG PERFORMANCE STATUS: 0 - Asymptomatic  Filed Vitals:   01/12/15 1033  BP: 104/72  Pulse: 61  Temp: 98.2 F (36.8 C)  Resp: 16    GENERAL:alert, no distress, well nourished, well developed, comfortable, cooperative, smiling  SKIN: skin color, texture, turgor are normal, no rashes or significant lesions HEAD: Normocephalic EYES: normal, EOMI, Conjunctiva are pink and non-injected EARS: External ears normal OROPHARYNX:lips, buccal mucosa, and mucous membranes are moist Significant oral mucositis noted NECK: Left neck mass is remarkably smaller, less palpable than prior visit LYMPH:  As above BREAST:not examined LUNGS: Clear to auscultation HEART: Regular rate and rhythm ABDOMEN:abdomen soft and normal bowel sounds, FT is C/D/I BACK: Back symmetric, no curvature. EXTREMITIES:less then 2 second capillary refill, no joint deformities, effusion, or inflammation, no skin discoloration, no  cyanosis  NEURO: alert & oriented x 3 with fluent speech, no focal motor/sensory deficits, gait normal   LABORATORY DATA: I have reviewed the data detailed below Results for Alexander [name], Alexander [name] (MRN [id number])   Ref. Range 01/12/2015 11:22  Sodium Latest Ref Range: 135-145 mmol/L 136  Potassium Latest Ref Range: 3.5-5.1 mmol/L 4.4  Chloride Latest Ref Range: 101-111 mmol/L 104  CO2 Latest Ref Range: 22-32 mmol/L 24  BUN Latest Ref Range: 6-20 mg/dL 25 (H)  Creatinine Latest Ref Range: 0.61-1.24 mg/dL 2.24 (H)  Calcium Latest Ref Range: 8.9-10.3 mg/dL 9.3  EGFR (Non-African Amer.) Latest Ref Range: >60 mL/min 29 (L)  EGFR (African American) Latest Ref Range: >60 mL/min 34 (L)  Glucose Latest Ref Range: 65-99 mg/dL 94  Anion gap Latest Ref Range: 5-15  8  Alkaline Phosphatase Latest Ref Range: 38-126 U/L 44  Albumin Latest Ref Range: 3.5-5.0 g/dL 3.8  AST Latest Ref Range: 15-41 U/L 17  ALT Latest Ref Range: 17-63 U/L 13 (L)  Total Protein Latest Ref Range: 6.5-8.1 g/dL 6.8  Total Bilirubin Latest Ref Range: 0.3-1.2 mg/dL 0.4  WBC Latest Ref Range: 4.0-10.5 K/uL 3.5 (L)  RBC Latest Ref Range: 4.22-5.81 MIL/uL 3.31 (L)  Hemoglobin Latest Ref Range: 13.0-17.0 g/dL 10.4 (L)  HCT Latest Ref Range: 39.0-52.0 % 31.5 (L)  MCV Latest Ref Range: 78.0-100.0 fL 95.2  MCH Latest Ref Range: 26.0-34.0 pg 31.4  MCHC Latest Ref Range: 30.0-36.0 g/dL 33.0  RDW Latest Ref Range: 11.5-15.5 % 13.2  Platelets Latest Ref Range: 150-400 K/uL 193  Neutrophils Latest Units: % 63  Lymphocytes Latest Units: % 22  Monocytes Relative Latest Units: % 12  Eosinophil Latest Units: % 3  Basophil Latest Units: % 0  NEUT# Latest Ref Range: 1.7-7.7 K/uL 2.2  Lymphocyte # Latest Ref Range: 0.7-4.0 K/uL 0.8  Monocyte # Latest Ref Range: 0.1-1.0 K/uL 0.4  Eosinophils Absolute Latest Ref Range: 0.0-0.7 K/uL 0.1  Basophils Absolute Latest Ref Range: 0.0-0.1 K/uL 0.0      ASSESSMENT AND PLAN:  Stage IVA invasive  squamous cell carcinoma of oropharynx.  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 He is taking food by mouth, he uses his FT for Boost/Ensure at least once daily. He notes that he eats by mouth at every meal.   He is off long acting pain medication and we will work with him moving forward to decrease his oxycodone use.   He is scheduled for imaging on the 31st by Dr. Isidore Moos. He is due for a port flush today.   All questions were answered. The patient knows to call the clinic with any problems, questions or concerns. We can certainly see the patient much sooner if necessary.   Follow up in the beginning of December.  Patient will call if he needs a refill of medication.  He will contact the clinic if he experiences any other problems.  This document serves as a record of services personally performed by Ancil Linsey, MD. It was created on her behalf by Janace Hoard, a trained medical scribe. The creation of this record is based on the scribe's personal observations and the provider's statements to them. This document has been checked and approved by the attending provider.  I have reviewed the above documentation for accuracy and completeness, and I agree with the above.   This note was electronically signed.  Kelby Fam. Whitney Muse, MD

## 2015-01-12 NOTE — Progress Notes (Signed)
Alexander [name] presented for Portacath access and flush.  Portacath located right chest wall accessed with  H 20 needle.  Good blood return present. Portacath flushed with 56m NS and 500U/561mHeparin and needle removed intact.  Procedure tolerated well and without incident.

## 2015-01-14 ENCOUNTER — Encounter (HOSPITAL_COMMUNITY): Payer: Self-pay | Admitting: Dentistry

## 2015-01-18 ENCOUNTER — Encounter (HOSPITAL_COMMUNITY): Payer: Self-pay | Admitting: Dentistry

## 2015-01-18 ENCOUNTER — Telehealth (HOSPITAL_COMMUNITY): Payer: Self-pay | Admitting: Dentistry

## 2015-01-18 NOTE — Telephone Encounter (Signed)
01/18/2015  Patient:            Alexander [name] Date of Birth:  [birth year] MRN:                [id number]   Patient called and left a message on the missing indicating that he had transportation problems. Appointment was canceled. Patient will call back to reschedule. Dr. Enrique Sack

## 2015-01-25 ENCOUNTER — Ambulatory Visit (HOSPITAL_COMMUNITY)
Admission: RE | Admit: 2015-01-25 | Discharge: 2015-01-25 | Disposition: A | Discharge status: Home / Self Care | Payer: Medicare HMO | Source: Ambulatory Visit | Attending: Radiation Oncology | Admitting: Radiation Oncology

## 2015-01-25 DIAGNOSIS — R590 Localized enlarged lymph nodes: Secondary | ICD-10-CM | POA: Diagnosis not present

## 2015-01-25 DIAGNOSIS — C01 Malignant neoplasm of base of tongue: Secondary | ICD-10-CM | POA: Diagnosis present

## 2015-01-25 DIAGNOSIS — Z923 Personal history of irradiation: Secondary | ICD-10-CM | POA: Diagnosis not present

## 2015-01-25 DIAGNOSIS — N402 Nodular prostate without lower urinary tract symptoms: Secondary | ICD-10-CM | POA: Diagnosis not present

## 2015-01-25 DIAGNOSIS — Z9221 Personal history of antineoplastic chemotherapy: Secondary | ICD-10-CM | POA: Diagnosis not present

## 2015-01-25 DIAGNOSIS — Z931 Gastrostomy status: Secondary | ICD-10-CM | POA: Insufficient documentation

## 2015-01-25 LAB — GLUCOSE, CAPILLARY: Glucose-Capillary: 81 mg/dL (ref 65–99)

## 2015-01-25 MED ORDER — FLUDEOXYGLUCOSE F - 18 (FDG) INJECTION
8.1000 | Freq: Once | INTRAVENOUS | Status: DC | PRN
  Administered 2015-01-25: 8.1 via INTRAVENOUS
  Filled 2015-01-25: qty 8.1

## 2015-01-26 ENCOUNTER — Encounter (HOSPITAL_COMMUNITY): Payer: Self-pay | Admitting: Dentistry

## 2015-01-26 ENCOUNTER — Ambulatory Visit (HOSPITAL_COMMUNITY): Payer: Medicaid - Dental | Admitting: Dentistry

## 2015-01-26 VITALS — BP 122/54 | HR 63 | Temp 98.8°F

## 2015-01-26 DIAGNOSIS — Z9221 Personal history of antineoplastic chemotherapy: Secondary | ICD-10-CM

## 2015-01-26 DIAGNOSIS — Z463 Encounter for fitting and adjustment of dental prosthetic device: Secondary | ICD-10-CM

## 2015-01-26 DIAGNOSIS — K08109 Complete loss of teeth, unspecified cause, unspecified class: Secondary | ICD-10-CM

## 2015-01-26 DIAGNOSIS — R682 Dry mouth, unspecified: Secondary | ICD-10-CM

## 2015-01-26 DIAGNOSIS — C01 Malignant neoplasm of base of tongue: Secondary | ICD-10-CM

## 2015-01-26 DIAGNOSIS — K0820 Unspecified atrophy of edentulous alveolar ridge: Secondary | ICD-10-CM

## 2015-01-26 DIAGNOSIS — Z923 Personal history of irradiation: Secondary | ICD-10-CM

## 2015-01-26 DIAGNOSIS — K117 Disturbances of salivary secretion: Secondary | ICD-10-CM

## 2015-01-26 NOTE — Progress Notes (Signed)
01/26/2015  Patient Name:   Alexander [name] Date of Birth:   1950-03-30 Medical Record Number: [id number]  BP 122/54 mmHg  Pulse 63  Temp(Src) 98.8 F (37.1 C) (Oral)  MENASHE KAFER presents for start of upper and lower denture fabrication.  Exam: Patient is edentulous. Discussed procedures involved in upper and lower denture fabrication and prognosis for successful ability to wear dentures. Price for dentures confirmed.  Patient agrees to proceed with upper and lower denture fabrication. Procedure:  Upper and lower denture primary impressions in alginate. Lab pour. To [facility] for upper and lower denture custom tray fabrication. RTC for upper and lower denture final impressions.  Lenn Cal, DDS

## 2015-01-26 NOTE — Patient Instructions (Signed)
Return to clinic as scheduled for continued upper and lower complete denture fabrication. Dr. Kulinski 

## 2015-02-02 ENCOUNTER — Other Ambulatory Visit (HOSPITAL_COMMUNITY): Payer: Self-pay | Admitting: *Deleted

## 2015-02-02 DIAGNOSIS — C01 Malignant neoplasm of base of tongue: Secondary | ICD-10-CM

## 2015-02-02 MED ORDER — OXYCODONE HCL 20 MG PO TABS: 20.0000 mg | ORAL_TABLET | Freq: Four times a day (QID) | ORAL | Status: DC | PRN

## 2015-02-04 ENCOUNTER — Encounter (HOSPITAL_COMMUNITY): Payer: Self-pay | Admitting: Dentistry

## 2015-02-04 ENCOUNTER — Ambulatory Visit (HOSPITAL_COMMUNITY): Payer: Medicaid - Dental | Admitting: Dentistry

## 2015-02-04 VITALS — BP 120/62 | HR 62

## 2015-02-04 DIAGNOSIS — R682 Dry mouth, unspecified: Secondary | ICD-10-CM

## 2015-02-04 DIAGNOSIS — K08109 Complete loss of teeth, unspecified cause, unspecified class: Secondary | ICD-10-CM

## 2015-02-04 DIAGNOSIS — K0820 Unspecified atrophy of edentulous alveolar ridge: Secondary | ICD-10-CM

## 2015-02-04 DIAGNOSIS — Z463 Encounter for fitting and adjustment of dental prosthetic device: Secondary | ICD-10-CM

## 2015-02-04 DIAGNOSIS — C01 Malignant neoplasm of base of tongue: Secondary | ICD-10-CM

## 2015-02-04 DIAGNOSIS — Z9221 Personal history of antineoplastic chemotherapy: Secondary | ICD-10-CM

## 2015-02-04 DIAGNOSIS — K117 Disturbances of salivary secretion: Secondary | ICD-10-CM

## 2015-02-04 DIAGNOSIS — Z923 Personal history of irradiation: Secondary | ICD-10-CM

## 2015-02-04 NOTE — Progress Notes (Signed)
02/04/2015  Patient Name:   Alexander [name] Date of Birth:   1950/04/09 Medical Record Number: [id number]  BP 120/62 mmHg  Pulse 65  [name] presents for continued upper and lower complete denture fabrication.  Procedure:  Upper and lower border molding and final impressions in Aquasil. Patient tolerated procedure well. To [facility] for custom baseplates with rims. Return to clinic for upper and lower complete denture jaw relations.  Lenn Cal, DDS

## 2015-02-04 NOTE — Patient Instructions (Signed)
Return to clinic as scheduled for continued upper and lower complete denture fabrication. Dr. Kulinski 

## 2015-02-05 DIAGNOSIS — Z463 Encounter for fitting and adjustment of dental prosthetic device: Secondary | ICD-10-CM

## 2015-02-05 DIAGNOSIS — K0820 Unspecified atrophy of edentulous alveolar ridge: Secondary | ICD-10-CM

## 2015-02-05 DIAGNOSIS — K08109 Complete loss of teeth, unspecified cause, unspecified class: Secondary | ICD-10-CM

## 2015-02-15 ENCOUNTER — Encounter (HOSPITAL_COMMUNITY): Payer: Self-pay | Admitting: Dentistry

## 2015-02-15 ENCOUNTER — Ambulatory Visit (HOSPITAL_COMMUNITY): Payer: Medicaid - Dental | Admitting: Dentistry

## 2015-02-15 VITALS — BP 146/74 | HR 75 | Temp 98.0°F

## 2015-02-15 DIAGNOSIS — Z463 Encounter for fitting and adjustment of dental prosthetic device: Secondary | ICD-10-CM

## 2015-02-15 DIAGNOSIS — K08109 Complete loss of teeth, unspecified cause, unspecified class: Secondary | ICD-10-CM

## 2015-02-15 DIAGNOSIS — Z9221 Personal history of antineoplastic chemotherapy: Secondary | ICD-10-CM

## 2015-02-15 DIAGNOSIS — K0820 Unspecified atrophy of edentulous alveolar ridge: Secondary | ICD-10-CM

## 2015-02-15 DIAGNOSIS — C01 Malignant neoplasm of base of tongue: Secondary | ICD-10-CM

## 2015-02-15 DIAGNOSIS — R682 Dry mouth, unspecified: Secondary | ICD-10-CM

## 2015-02-15 DIAGNOSIS — Z923 Personal history of irradiation: Secondary | ICD-10-CM

## 2015-02-15 DIAGNOSIS — K117 Disturbances of salivary secretion: Secondary | ICD-10-CM

## 2015-02-15 NOTE — Progress Notes (Signed)
02/15/2015  Patient Name:   Alexander [name] Date of Birth:   May 12, 1950 Medical Record Number: [id number]  BP 146/74 mmHg  Pulse 75  Temp(Src) 98 F (36.7 C) (Oral)   [name] presents for continued denture fabrication.  Procedure:  Upper and lower denture Jaw relations with aluwax bite registration. Patient agrees to tooth selection of 22 E, H, and 10 degree posteriors to match with Portrait A2 shade. Patient tolerated procedure well. RTC for denture wax try in.   Lenn Cal, DDS

## 2015-02-15 NOTE — Patient Instructions (Signed)
Return to clinic as scheduled for continued upper lower complete denture fabrication. Dr. Kulinski 

## 2015-02-22 ENCOUNTER — Encounter (HOSPITAL_COMMUNITY): Payer: Self-pay | Admitting: Dentistry

## 2015-02-22 ENCOUNTER — Ambulatory Visit (HOSPITAL_COMMUNITY): Payer: Medicaid - Dental | Admitting: Dentistry

## 2015-02-22 VITALS — BP 124/62 | HR 64 | Temp 98.3°F

## 2015-02-22 DIAGNOSIS — Z463 Encounter for fitting and adjustment of dental prosthetic device: Secondary | ICD-10-CM

## 2015-02-22 DIAGNOSIS — K0820 Unspecified atrophy of edentulous alveolar ridge: Secondary | ICD-10-CM

## 2015-02-22 DIAGNOSIS — K08109 Complete loss of teeth, unspecified cause, unspecified class: Secondary | ICD-10-CM

## 2015-02-22 DIAGNOSIS — Z9221 Personal history of antineoplastic chemotherapy: Secondary | ICD-10-CM

## 2015-02-22 DIAGNOSIS — Z923 Personal history of irradiation: Secondary | ICD-10-CM

## 2015-02-22 DIAGNOSIS — C01 Malignant neoplasm of base of tongue: Secondary | ICD-10-CM

## 2015-02-22 DIAGNOSIS — R682 Dry mouth, unspecified: Secondary | ICD-10-CM

## 2015-02-22 DIAGNOSIS — K117 Disturbances of salivary secretion: Secondary | ICD-10-CM

## 2015-02-22 NOTE — Progress Notes (Signed)
02/22/2015  Patient Name:   Alexander [name] Date of Birth:   [birth year] Medical Record Number: [id number]  BP 124/62 mmHg  Pulse 64  Temp(Src) 98.3 F (36.8 C) (Oral)  RAYDEL HOSICK presents for continued upper and lower denture fabrication.  Procedure:  Upper and lower denture wax tryin. Aesthetics are acceptable but the occlusion was off on the right side. Patient accepts esthetics and phonetics of the upper denture. The mandibular posterior teeth were removed and a new bite registration was obtained. Teeth will be reset by the lab per instructions. Patient is to return to clinic for wax try in 2 as scheduled. Patient agrees with the plan of care. Patient to RTC for upper and lower denture wax try in 2.  Lenn Cal, DDS

## 2015-02-22 NOTE — Patient Instructions (Signed)
Return to clinic as scheduled for continued upper and lower complete denture fabrication. Dr. Adrien Shankar 

## 2015-03-02 ENCOUNTER — Ambulatory Visit (HOSPITAL_COMMUNITY): Payer: Medicaid - Dental | Admitting: Dentistry

## 2015-03-02 ENCOUNTER — Encounter (HOSPITAL_COMMUNITY): Payer: Self-pay | Admitting: Dentistry

## 2015-03-02 VITALS — BP 101/75 | HR 76 | Temp 98.1°F

## 2015-03-02 DIAGNOSIS — Z463 Encounter for fitting and adjustment of dental prosthetic device: Secondary | ICD-10-CM

## 2015-03-02 DIAGNOSIS — K0820 Unspecified atrophy of edentulous alveolar ridge: Secondary | ICD-10-CM

## 2015-03-02 DIAGNOSIS — Z923 Personal history of irradiation: Secondary | ICD-10-CM

## 2015-03-02 DIAGNOSIS — K08109 Complete loss of teeth, unspecified cause, unspecified class: Secondary | ICD-10-CM

## 2015-03-02 DIAGNOSIS — Z9221 Personal history of antineoplastic chemotherapy: Secondary | ICD-10-CM

## 2015-03-02 DIAGNOSIS — C01 Malignant neoplasm of base of tongue: Secondary | ICD-10-CM

## 2015-03-02 DIAGNOSIS — R682 Dry mouth, unspecified: Secondary | ICD-10-CM

## 2015-03-02 DIAGNOSIS — K117 Disturbances of salivary secretion: Secondary | ICD-10-CM

## 2015-03-02 NOTE — Progress Notes (Signed)
03/02/2015  Patient Name:   Alexander [name] Date of Birth:   Jun 06, 1950 Medical Record Number: [id number]   BP 101/75 mmHg  Pulse 76  Temp(Src) 98.1 F (36.7 C) (Oral)   Alexander [name] presents for continued upper and lower denture fabrication.  Procedure:  Upper and lower denture wax tryin-2. Patient is aware of the class II set up with dropping of lower premolar to allow for better maximum intercuspation. Patient accepts esthetics, phonetics, fit and function. Patient agrees to process "as is" in 50:50 [place] 199. Patient to RTC for  upper and lower denture insertion.  Lenn Cal, DDS

## 2015-03-02 NOTE — Patient Instructions (Signed)
Return to clinic as scheduled for continued upper and lower complete denture fabrication. Dr. Kulinski 

## 2015-03-09 ENCOUNTER — Other Ambulatory Visit (HOSPITAL_COMMUNITY): Payer: Self-pay | Admitting: Oncology

## 2015-03-09 ENCOUNTER — Ambulatory Visit (HOSPITAL_COMMUNITY): Payer: Self-pay | Admitting: Hematology & Oncology

## 2015-03-09 ENCOUNTER — Other Ambulatory Visit (HOSPITAL_COMMUNITY): Payer: Self-pay

## 2015-03-09 DIAGNOSIS — C01 Malignant neoplasm of base of tongue: Secondary | ICD-10-CM

## 2015-03-10 ENCOUNTER — Other Ambulatory Visit (HOSPITAL_COMMUNITY): Payer: Self-pay | Admitting: Oncology

## 2015-03-10 ENCOUNTER — Encounter (HOSPITAL_COMMUNITY): Payer: Self-pay | Admitting: Dentistry

## 2015-03-10 ENCOUNTER — Ambulatory Visit (HOSPITAL_COMMUNITY): Payer: Medicaid - Dental | Admitting: Dentistry

## 2015-03-10 VITALS — BP 115/68 | HR 63 | Temp 98.5°F

## 2015-03-10 DIAGNOSIS — K0820 Unspecified atrophy of edentulous alveolar ridge: Secondary | ICD-10-CM

## 2015-03-10 DIAGNOSIS — C01 Malignant neoplasm of base of tongue: Secondary | ICD-10-CM

## 2015-03-10 DIAGNOSIS — Z9221 Personal history of antineoplastic chemotherapy: Secondary | ICD-10-CM

## 2015-03-10 DIAGNOSIS — Z463 Encounter for fitting and adjustment of dental prosthetic device: Secondary | ICD-10-CM

## 2015-03-10 DIAGNOSIS — K117 Disturbances of salivary secretion: Secondary | ICD-10-CM

## 2015-03-10 DIAGNOSIS — R682 Dry mouth, unspecified: Secondary | ICD-10-CM

## 2015-03-10 DIAGNOSIS — K08109 Complete loss of teeth, unspecified cause, unspecified class: Secondary | ICD-10-CM

## 2015-03-10 DIAGNOSIS — C109 Malignant neoplasm of oropharynx, unspecified: Secondary | ICD-10-CM

## 2015-03-10 DIAGNOSIS — Z923 Personal history of irradiation: Secondary | ICD-10-CM

## 2015-03-10 MED ORDER — OXYCODONE HCL 20 MG PO TABS: 20.0000 mg | ORAL_TABLET | Freq: Four times a day (QID) | ORAL | Status: DC | PRN

## 2015-03-10 MED ORDER — MEGESTROL ACETATE 400 MG/10ML PO SUSP: 400.0000 mg | Freq: Every day | ORAL | Status: DC

## 2015-03-10 NOTE — Patient Instructions (Signed)
Instructions for Denture Use and Care  Congratulations, you are on the way to oral rehabilitation!  You have just received a new set of complete or partial dentures.  These prostheses will help to improve both your appearance and chewing ability.  These instructions will help you get adjusted to your dentures as well as care for them properly.  Please read these instructions carefully and completely as soon as you get home.  If you or your caregiver have any questions please notify the [facility] at [phone].  HOW YOUR DENTURES LOOK AND FEEL Soon after you begin wearing your dentures, you may feel that your dentures are too large or even loose.  As our mouth and facial muscles become accustomed to the dentures, these feelings will go away.  You also may feel that you are salivating more than you normally do.  This feeling should go away as you get used to having the dentures in your mouth.  You may bite your cheek or your tongue; this will eventually resolve itself as you wear your dentures.  Some soreness is to be expected, but you should not hurt.  If your mouth hurts, call your dentist.  A denture adhesive may occasionally be necessary to hold your dentures in place more securely.  The dentist will let you know when one is recommended for you.  SPEAKING Wearing dentures will change the sound of your voice initially.  This will be noticed by you more than anyone else.  Bite and swallow before you speak, in order to place your dentures in position so that you may speak more clearly.  Practice speaking by reading aloud or counting from 1 to 100 very slowly and distinctly.  After some practice your mouth will become accustomed to your dentures and you will speak more clearly.  EATING Chewing will definitely be different after you receive your dentures.  With a little practice and patience you should be able to eat just about any kind of food.  Begin by eating small quantities of food  that are cut into small pieces.  Star with soft foods such as eggs, cooked vegetables, or puddings.  As you gain confidence advance  Your diet to whatever texture foods you can tolerate.  DENTURE CARE Dentures can collect plaque and calculus much the same as natural teeth can.  If not removed on a regular basis, your dentures will not look or feel clean, and you will experience denture odor.  It is very important that you remove your dentures at bedtime and clean them thoroughly.  You should: 1. Clean your dentures over a sink full of water so if dropped, breakage will be prevented. 2. Rinse your dentures with cool water to remove any large food particles. 3. Use soap and water or a denture cleanser or paste to clean the dentures.  Do not use regular toothpaste as it may abrade the denture base or teeth. 4. Use a moistened denture brush to clean all surfaces (inside and outside). 5. Rinse thoroughly to remove any remaining soap or denture cleanser. 6. Use a soft bristle toothbrush to gently brush any natural teeth, gums, tongue, and palate at bedtime and before reinserting your dentures. 7. Do not sleep with your dentures in your mouth at night.  Remove your dentures and soak them overnight in a denture cup filled with water or denture solution as recommended by your dentist.  This routine will become second nature and will increase the life and comfort   of your dentures.  Please do not try to adjust these dentures yourself; you could damage them.  FOLLOW-UP You should call or make an appointment with your dentist.  Your dentist would like to see you at least once a year for a check-up and examination. 

## 2015-03-10 NOTE — Progress Notes (Signed)
03/10/2015  Patient Name:   Alexander [name] Date of Birth:   Dec 18, 1950 Medical Record Number: [id number]  BP 115/68 mmHg  Pulse 63  Temp(Src) 98.5 F (36.9 C) (Oral)   [name] presents for insertion of upper and lower complete dentures.  Procedure: Pressure indicating paste was applied to the dentures. Adjustments were made as needed. [place] Occlusion evaluated and adjustments made as needed for Centric Relation and protrusive strokes. Good esthetics, phonetics, fit, and function noted. Patient accepts results. Post op instructions provided in written and verbal formats on use and care of dentures. Gave patient denture brush and cup. Patient to keep dentures out if sore spots develop. Use salt water rinses as needed to aid healing. Return to clinic as scheduled for denture adjustment.   Call if problems arise before then.  Lenn Cal, DDS

## 2015-03-11 ENCOUNTER — Ambulatory Visit (INDEPENDENT_AMBULATORY_CARE_PROVIDER_SITE_OTHER): Payer: Medicare HMO | Admitting: Otolaryngology

## 2015-03-11 DIAGNOSIS — C109 Malignant neoplasm of oropharynx, unspecified: Secondary | ICD-10-CM | POA: Diagnosis not present

## 2015-03-12 ENCOUNTER — Encounter (HOSPITAL_COMMUNITY): Payer: Self-pay | Admitting: Oncology

## 2015-03-12 ENCOUNTER — Encounter (HOSPITAL_COMMUNITY): Payer: Medicare HMO | Attending: Oncology | Admitting: Oncology

## 2015-03-12 ENCOUNTER — Encounter (HOSPITAL_COMMUNITY): Payer: Medicare HMO

## 2015-03-12 VITALS — BP 120/76 | HR 84 | Temp 98.0°F | Resp 18 | Wt 149.4 lb

## 2015-03-12 DIAGNOSIS — C109 Malignant neoplasm of oropharynx, unspecified: Secondary | ICD-10-CM | POA: Diagnosis present

## 2015-03-12 DIAGNOSIS — C01 Malignant neoplasm of base of tongue: Secondary | ICD-10-CM | POA: Diagnosis not present

## 2015-03-12 DIAGNOSIS — Z95828 Presence of other vascular implants and grafts: Secondary | ICD-10-CM

## 2015-03-12 DIAGNOSIS — Z9889 Other specified postprocedural states: Secondary | ICD-10-CM | POA: Insufficient documentation

## 2015-03-12 LAB — CBC WITH DIFFERENTIAL/PLATELET
BASOS ABS: 0 10*3/uL (ref 0.0–0.1)
BASOS PCT: 0 %
Eosinophils Absolute: 0.2 10*3/uL (ref 0.0–0.7)
Eosinophils Relative: 3 %
HEMATOCRIT: 38.7 % — AB (ref 39.0–52.0)
HEMOGLOBIN: 12.8 g/dL — AB (ref 13.0–17.0)
LYMPHS PCT: 16 %
Lymphs Abs: 0.8 10*3/uL (ref 0.7–4.0)
MCH: 31.2 pg (ref 26.0–34.0)
MCHC: 33.1 g/dL (ref 30.0–36.0)
MCV: 94.4 fL (ref 78.0–100.0)
Monocytes Absolute: 0.5 10*3/uL (ref 0.1–1.0)
Monocytes Relative: 10 %
NEUTROS ABS: 3.3 10*3/uL (ref 1.7–7.7)
NEUTROS PCT: 71 %
Platelets: 229 10*3/uL (ref 150–400)
RBC: 4.1 MIL/uL — AB (ref 4.22–5.81)
RDW: 14.1 % (ref 11.5–15.5)
WBC: 4.7 10*3/uL (ref 4.0–10.5)

## 2015-03-12 LAB — COMPREHENSIVE METABOLIC PANEL
ALT: 15 U/L — ABNORMAL LOW (ref 17–63)
ANION GAP: 15 (ref 5–15)
AST: 21 U/L (ref 15–41)
Albumin: 4.5 g/dL (ref 3.5–5.0)
Alkaline Phosphatase: 57 U/L (ref 38–126)
BILIRUBIN TOTAL: 0.5 mg/dL (ref 0.3–1.2)
BUN: 18 mg/dL (ref 6–20)
CALCIUM: 9.8 mg/dL (ref 8.9–10.3)
CO2: 28 mmol/L (ref 22–32)
Chloride: 99 mmol/L — ABNORMAL LOW (ref 101–111)
Creatinine, Ser: 2.37 mg/dL — ABNORMAL HIGH (ref 0.61–1.24)
GFR, EST AFRICAN AMERICAN: 31 mL/min — AB (ref >60)
GFR, EST NON AFRICAN AMERICAN: 27 mL/min — AB (ref >60)
Glucose, Bld: 92 mg/dL (ref 65–99)
POTASSIUM: 3.8 mmol/L (ref 3.5–5.1)
Sodium: 142 mmol/L (ref 135–145)
TOTAL PROTEIN: 8 g/dL (ref 6.5–8.1)

## 2015-03-12 MED ORDER — HEPARIN SOD (PORK) LOCK FLUSH 100 UNIT/ML IV SOLN
500.0000 [IU] | Freq: Once | INTRAVENOUS | Status: AC
  Administered 2015-03-12: 500 [IU] via INTRAVENOUS

## 2015-03-12 MED ORDER — SODIUM CHLORIDE 0.9 % IJ SOLN
10.0000 mL | Freq: Once | INTRAMUSCULAR | Status: AC
  Administered 2015-03-12: 10 mL via INTRAVENOUS

## 2015-03-12 NOTE — Assessment & Plan Note (Addendum)
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 S/P curative concomitant chemo/XRT with remission on PET imaging in October 2016.  Oncology history is updated.  Continue follow-up with Dr. Enrique Sack, Dr. Benjamine Mola, and Dr. Isidore Moos.  Labs today: CBC diff, CMET.  He was given Oxycodone #110 2 days ago.  He is agreeable to a taper to this medication moving forward.  This medication is effective for his pain, but "I hate taking medications."  I think a reasonable taper is #15-20 every month.    I hope to plan on PEG removal by the time of his next follow-up appointment.  However, he knows to call us if he is ready to have it removed sooner than that.  Return in 2 months for labs, port flush, and follow-up appointment.

## 2015-03-12 NOTE — Progress Notes (Signed)
No PCP Per Patient No address on file  Oropharyngeal cancer (Alexander [name]) - Plan: CBC with Differential, Comprehensive metabolic panel, TSH  CURRENT THERAPY: Surveillance per NCCN guidelines  INTERVAL HISTORY: Alexander [name] 64 y.o. 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     Oropharyngeal cancer (Alexander [name])   07/27/2014 Imaging CT neck- Advanced stage oropharyngeal cancer with necrotic adenopathy accounting for the left neck swelling.   07/28/2014 Initial Diagnosis Oropharyngeal cancer   08/03/2014 Imaging CT CAP- L supraclavicular lymphadenopathy is not completely visualized. This is better seen on the previous neck CT from 07/27/2014. Otherwise, no evidence for metastatic disease in the chest, abdomen, or pelvis.   08/03/2014 Imaging Bone scan- Uptake at adjacent anterior LEFT 6, 7, 8 ribs likely representing trauma/fractures. Questionable nonspecific increased tracer localization at the posterior RIGHT 8th and 9th ribs, the adjacent nature which raises a a question of trauma as well   08/06/2014 Pathology Results Dr. Benjamine Mola- Oropharynx, biopsy, Left - INVASIVE SQUAMOUS CELL CARCINOMA.   08/12/2014 Procedure Dr. Enrique Sack- 1. Multiple extraction of tooth numbers 6, 17, 22, 23, 24, 25, 26, and 27. 3 Quadrants of alveoloplasty   08/26/2014 PET scan Large hypermetabolic mass in the left base of tongue. Activity extends across midline to the right base tongue. 2. Intensely hypermetabolic left cervical metastatic lymph nodes. Lymph nodes extend from the left level II position to the left supraclavi   09/01/2014 - 10/23/2014 Chemotherapy Cisplatin/XRT   01/25/2015 PET scan Near complete resolution of metabolic activity at the base of tongue. Minimal residual activity is likely post treatment effect. 2. Complete resolution of metabolic activity above LEFT cervical lymph nodes. No evidence of residual metabolically active    He saw Dr. Benjamine Mola yesterday.  He notes that  Dr. Benjamine Mola is very pleased with the patient's progress.  Additionally, he has been seeing Dr. Enrique Sack who recently developed dentures.  He has a follow-up appointment shortly to evaluate the fit of his new dentures.  Alexander [name] is very happy with his dentures.  I personally reviewed and went over laboratory results with the patient.  The results are noted within this dictation.  Labs will be updated today.   He still uses his PEG for 2-3 cans.  He notes that he uses it a few times per week.  He is hopeful that with the addition of his dentures, he will eat more solid food.  He is hopeful to have his PEG removed the 1st quarter of 2017.  Oncologically, he denies any complaints.   Past Medical History  Diagnosis Date  . Oropharyngeal cancer ([place]) 07/28/2014    dx. 3 weeks ago.- Dr. [facility] [place], [place].  . 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     has No Known Allergies.  Current Outpatient Prescriptions on File Prior to Visit  Medication Sig Dispense Refill  . Diphenhyd-Hydrocort-Nystatin (FIRST-DUKES MOUTHWASH) SUSP Use as directed 5 mLs in the mouth or throat 4 (four) times daily as needed. 300 mL 1  . lidocaine (XYLOCAINE) 2 % solution Use as directed 20 mLs in the mouth or throat as needed for mouth pain.    . megestrol (MEGACE) 400 MG/10ML  suspension Place 10 mLs (400 mg total) into feeding tube daily. 240 mL 1  . Nutritional Supplements (JEVITY 1.2 CAL/FIBER) LIQD 7 cans Jevity 1.2 split into 3 feeds of 2-3-2 cans. Flush 100 mls before/after each feed.  kcals ,92 g Pro, Fluid: 1337 + 600 mls from flushes    . Oxycodone HCl 20 MG TABS Take 1 tablet (20 mg total) by mouth 4 (four) times daily as needed (throat pain). 120 tablet 0  .  polyethylene glycol (MIRALAX / GLYCOLAX) packet Take 17 g by mouth daily.    . SPS 15 GM/60ML suspension Reported on 03/12/2015     No current facility-administered medications on file prior to visit.    Past Surgical History  Procedure Laterality Date  . Panendoscopy N/A 08/06/2014    Procedure: PANENDOSCOPY WITH BIOPSY;  Surgeon: Leta Baptist, MD;  Location: [facility];  Service: ENT;  Laterality: N/A;  . Multiple extractions with alveoloplasty N/A 08/12/2014    Procedure: Extraction of tooth #'s 6,17,22,23,24,25,26,27 with alveoloplasty;  Surgeon: Lenn Cal, DDS;  Location: [facility];  Service: Oral Surgery;  Laterality: N/A;  . Peg placement Left 08/17/14  . Portacath placement Right 08/17/14  . Portacath placement Right 08/17/2014    Procedure: INSERTION PORT-A-CATH (procedure #2);  Surgeon: Aviva Signs Md, MD;  Location: [facility];  Service: General;  Laterality: Right;  . Peg placement N/A 08/17/2014    Procedure: PERCUTANEOUS ENDOSCOPIC GASTROSTOMY (PEG) PLACEMENT (procedure #1);  Surgeon: Aviva Signs Md, MD;  Location: [facility];  Service: General;  Laterality: N/A;  . Esophagogastroduodenoscopy (egd) with propofol N/A 08/17/2014    Procedure: ESOPHAGOGASTRODUODENOSCOPY (EGD) WITH PROPOFOL (procedure #1);  Surgeon: Aviva Signs Md, MD;  Location: [facility];  Service: General;  Laterality: N/A;    Denies any headaches, dizziness, double vision, fevers, chills, night sweats, nausea, vomiting, diarrhea, constipation, chest pain, heart palpitations, shortness of breath, blood in stool, black tarry stool, urinary pain, urinary burning, urinary frequency, hematuria.   PHYSICAL EXAMINATION  ECOG PERFORMANCE STATUS: 1 - Symptomatic but completely ambulatory  Filed Vitals:   03/12/15 1446  BP: 120/76  Pulse: 84  Temp: 98 F (36.7 C)  Resp: 18    GENERAL:alert, no distress, cachectic, comfortable, cooperative, smiling and unacommpanied SKIN: skin color, texture, turgor are  normal, no rashes or significant lesions HEAD: Normocephalic, No masses, lesions, tenderness or abnormalities EYES: normal, PERRLA, EOMI, Conjunctiva are pink and non-injected EARS: External ears normal OROPHARYNX: moist NECK: left neck thickening and hyperpigmentation, but soft and mobile.   BREAST:not examined LUNGS: clear to auscultation without wheezes, rales, or rhonchi. HEART: regular rate & rhythm, no murmurs and no gallops ABDOMEN:abdomen soft and normal bowel sounds BACK: Back symmetric, no curvature. EXTREMITIES:less then 2 second capillary refill, no joint deformities, effusion, or inflammation, no skin discoloration  NEURO: alert & oriented x 3 with fluent speech, no focal motor/sensory deficits, gait normal  LABORATORY DATA: CBC    Component Value Date/Time   WBC 3.5* 01/12/2015 1122   RBC 3.31* 01/12/2015 1122   HGB 10.4* 01/12/2015 1122   HCT 31.5* 01/12/2015 1122   PLT 193 01/12/2015 1122   MCV 95.2 01/12/2015 1122   MCH 31.4 01/12/2015 1122   MCHC 33.0 01/12/2015 1122   RDW 13.2 01/12/2015 1122   LYMPHSABS 0.8 01/12/2015 1122   MONOABS 0.4 01/12/2015 1122   EOSABS 0.1 01/12/2015 1122   BASOSABS 0.0 01/12/2015 1122      Chemistry      Component Value Date/Time  NA 136 01/12/2015 1122   K 4.4 01/12/2015 1122   CL 104 01/12/2015 1122   CO2 24 01/12/2015 1122   BUN 25* 01/12/2015 1122   CREATININE 2.24* 01/12/2015 1122      Component Value Date/Time   CALCIUM 9.3 01/12/2015 1122   ALKPHOS 44 01/12/2015 1122   AST 17 01/12/2015 1122   ALT 13* 01/12/2015 1122   BILITOT 0.4 01/12/2015 1122        PENDING LABS:   RADIOGRAPHIC STUDIES:  No results found.   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 S/P curative concomitant chemo/XRT with remission on PET imaging in October 2016.  Oncology history is updated.  Continue follow-up with Dr. Enrique Sack, Dr. Benjamine Mola, and Dr.  Isidore Moos.  Labs today: CBC diff, CMET.  He was given Oxycodone #110 2 days ago.  He is agreeable to a taper to this medication moving forward.  This medication is effective for his pain, but "I hate taking medications."  I think a reasonable taper is #15-20 every month.    I hope to plan on PEG removal by the time of his next follow-up appointment.  However, he knows to call us if he is ready to have it removed sooner than that.  Return in 2 months for labs, port flush, and follow-up appointment.     THERAPY PLAN:  NCCN guidelines for surveillance of Head and Neck cancer recommends:  A. H+P every 1-3 months for year 1  B. H+P every 2-6 months for year 2  C. H+P every 4-8 months for years 3-5  D. H+P every year for years greater than 5  E. Imaging only as clinically indicated following baseline imaging study within 6 months of completion of therapy.   F.Complete head and neck exam; mirror and fiberoptic examination as clinically indicated.  G. TSH every 6-12 months.  H. Chest imaging as clinically indicated for patients with smoking history  I. Speech/hearing and swallowing evaluation and rehabilitation as clinically indicated.  J. Smoking cessation and EtOH counseling as clinically indicated.  K. Dental evaluation: recommended for oral cavity and sites exposed to significant intraoral radiation treatment.  L. Consider EBV DNA monitoring for nasopharyngeal cancer.    All questions were answered. The patient knows to call the clinic with any problems, questions or concerns. We can certainly see the patient much sooner if necessary.  Patient and plan discussed with Dr. Ancil Linsey and she is in agreement with the aforementioned.   This note is electronically signed by: Doy Mince 03/12/2015 4:04 PM

## 2015-03-12 NOTE — Progress Notes (Signed)
This encounter was created in error - please disregard.

## 2015-03-12 NOTE — Patient Instructions (Signed)
[place] at [facility] Discharge Instructions  RECOMMENDATIONS MADE BY THE CONSULTANT AND ANY TEST RESULTS WILL BE SENT TO YOUR REFERRING PHYSICIAN.  Exam and discussion by Robynn Pane, PA-C You are doing well. Call with unexplained weight loss, pain or other concerns. Port flush and labs today  Follow-up: Labs and office visit in 2 months  Thank you for choosing [place] at [facility] to provide your oncology and hematology care.  To afford each patient quality time with our provider, please arrive at least 15 minutes before your scheduled appointment time.    You need to re-schedule your appointment should you arrive 10 or more minutes late.  We strive to give you quality time with our providers, and arriving late affects you and other patients whose appointments are after yours.  Also, if you no show three or more times for appointments you may be dismissed from the clinic at the providers discretion.     Again, thank you for choosing [facility].  Our hope is that these requests will decrease the amount of time that you wait before being seen by our physicians.       _____________________________________________________________  Should you have questions after your visit to [facility], please contact our office at [phone] between the hours of 8:30 a.m. and 4:30 p.m.  Voicemails left after 4:30 p.m. will not be returned until the following business day.  For prescription refill requests, have your pharmacy contact our office.

## 2015-03-12 NOTE — Progress Notes (Signed)
Alexander [name] presented for Portacath access and flush. Proper placement of portacath confirmed by CXR. Portacath located right chest wall accessed with  H 20 needle. Sluggish blood return and flushed with 30 ml saline Portacath flushed with 38m NS and 500U/539mHeparin and needle removed intact. Procedure without incident. Patient tolerated procedure well.  ChSabino Donovanresented for labwork. Labs per MD order drawn via Peripheral Line 23 gauge needle inserted in left AC  Good blood return present. Procedure without incident.  Needle removed intact. Patient tolerated procedure well.

## 2015-03-15 ENCOUNTER — Encounter (HOSPITAL_COMMUNITY): Payer: Self-pay | Admitting: Dentistry

## 2015-03-30 ENCOUNTER — Encounter (HOSPITAL_COMMUNITY): Payer: Self-pay | Admitting: Dentistry

## 2015-04-09 ENCOUNTER — Other Ambulatory Visit (HOSPITAL_COMMUNITY): Payer: Self-pay | Admitting: Oncology

## 2015-04-09 DIAGNOSIS — C01 Malignant neoplasm of base of tongue: Secondary | ICD-10-CM

## 2015-04-09 MED ORDER — OXYCODONE HCL 20 MG PO TABS: 20.0000 mg | ORAL_TABLET | Freq: Four times a day (QID) | ORAL | Status: DC | PRN

## 2015-05-07 ENCOUNTER — Other Ambulatory Visit (HOSPITAL_COMMUNITY): Payer: Self-pay

## 2015-05-07 ENCOUNTER — Ambulatory Visit (HOSPITAL_COMMUNITY): Payer: Self-pay | Admitting: Oncology

## 2015-05-12 ENCOUNTER — Telehealth (HOSPITAL_COMMUNITY): Payer: Self-pay | Admitting: *Deleted

## 2015-05-12 ENCOUNTER — Other Ambulatory Visit (HOSPITAL_COMMUNITY): Payer: Self-pay | Admitting: Oncology

## 2015-05-12 DIAGNOSIS — C01 Malignant neoplasm of base of tongue: Secondary | ICD-10-CM

## 2015-05-12 MED ORDER — OXYCODONE HCL 20 MG PO TABS: 20.0000 mg | ORAL_TABLET | Freq: Four times a day (QID) | ORAL | Status: DC | PRN

## 2015-06-09 ENCOUNTER — Other Ambulatory Visit (HOSPITAL_COMMUNITY): Payer: Self-pay | Admitting: Oncology

## 2015-06-09 ENCOUNTER — Telehealth (HOSPITAL_COMMUNITY): Payer: Self-pay | Admitting: *Deleted

## 2015-06-09 DIAGNOSIS — C01 Malignant neoplasm of base of tongue: Secondary | ICD-10-CM

## 2015-06-09 MED ORDER — OXYCODONE HCL 20 MG PO TABS: 20.0000 mg | ORAL_TABLET | Freq: Four times a day (QID) | ORAL | Status: DC | PRN

## 2015-06-09 NOTE — Telephone Encounter (Signed)
Ready.  Continued decrease in #  KEFALAS,THOMAS, PA-C

## 2015-07-05 ENCOUNTER — Telehealth (HOSPITAL_COMMUNITY): Payer: Self-pay

## 2015-07-05 NOTE — Telephone Encounter (Signed)
Called patient back and left message to let him know that Gershon Mussel said he needed to make an appointment to talk about getting feeding tube out.

## 2015-07-09 ENCOUNTER — Encounter (HOSPITAL_COMMUNITY): Payer: Self-pay

## 2015-07-10 ENCOUNTER — Other Ambulatory Visit (HOSPITAL_COMMUNITY): Payer: Self-pay | Admitting: Oncology

## 2015-07-12 ENCOUNTER — Other Ambulatory Visit (HOSPITAL_COMMUNITY): Payer: Self-pay | Admitting: Oncology

## 2015-07-12 DIAGNOSIS — C01 Malignant neoplasm of base of tongue: Secondary | ICD-10-CM

## 2015-07-12 MED ORDER — OXYCODONE HCL 20 MG PO TABS: 20.0000 mg | ORAL_TABLET | Freq: Four times a day (QID) | ORAL | Status: DC | PRN

## 2015-07-19 NOTE — Progress Notes (Signed)
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     Oropharyngeal cancer ([place])   07/27/2014 Imaging CT neck- Advanced stage oropharyngeal cancer with necrotic adenopathy accounting for the left neck swelling.   07/28/2014 Initial Diagnosis Oropharyngeal cancer   08/03/2014 Imaging CT CAP- L supraclavicular lymphadenopathy is not completely visualized. This is better seen on the previous neck CT from 07/27/2014. Otherwise, no evidence for metastatic disease in the chest, abdomen, or pelvis.   08/03/2014 Imaging Bone scan- Uptake at adjacent anterior LEFT 6, 7, 8 ribs likely representing trauma/fractures. Questionable nonspecific increased tracer localization at the posterior RIGHT 8th and 9th ribs, the adjacent nature which raises a a question of trauma as well   08/06/2014 Pathology Results Dr. Benjamine Mola- Oropharynx, biopsy, Left - INVASIVE SQUAMOUS CELL CARCINOMA.   08/12/2014 Procedure Dr. Enrique Sack- 1. Multiple extraction of tooth numbers 6, 17, 22, 23, 24, 25, 26, and 27. 3 Quadrants of alveoloplasty   08/17/2014 Pathology Results PORT and G-TUBE placed by Dr. Carlis Stable.   08/26/2014 PET scan Large hypermetabolic mass in the left base of tongue. Activity extends across midline to the right base tongue. 2. Intensely hypermetabolic left cervical metastatic lymph nodes. Lymph nodes extend from the left level II position to the left supraclavi   09/01/2014 - 10/23/2014 Chemotherapy Cisplatin/XRT   01/25/2015 PET scan Near complete resolution of metabolic activity at the base of tongue. Minimal residual activity is likely post treatment  effect. 2. Complete resolution of metabolic activity above LEFT cervical lymph nodes. No evidence of residual metabolically active    He was a NO SHOW for his Feb 2017 medical oncology follow-up appointment.  I personally reviewed and went over laboratory results with the patient.  The results are noted within this dictation.  Labs will be updated today.   He and his wife of 31 years recently separated approximately 2 months ago. This is the cause of his February 2017 no show appointment.  He notes that he is dealing with this well. He has talking with 2 ministers to help him deal with this recent life change. He moved out of the house. He remains in close contact with his wife's children (they have no biological children together). He reports that he stays in close contact with his stepdaughter. His stepson is incarcerated. Additionally, he remains active and his step-grandchildren's lives.  "We just grew apart." He does admit that he stays in contact with his wife, but it is infrequent.  His weight is down slightly, but he reports that he is eating very well. He notes that he is eating all foods types, including meats and breads.  He is interested in having it removed.  Since he is not using it, it would be reasonable.  I think his recent weight change is related to personal life changes.  He notes that he "loves" foods.  He notes a little decrease sensation of salty and sour food on the left side of his mouth, but his right side has full capability of taste.    He does note dry mouth.  He is using biotene.  The most effective intervention for him is  lemon-drop candies.  Oncologically, he denies any complaints.   Past Medical History  Diagnosis Date  . Oropharyngeal cancer ([place]) 07/28/2014    dx. 3 weeks ago.- Dr. Oneal [facility] [place], [place].  . 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[place]) 08/06/14      SCCa of Left BOT    has Oropharyngeal cancer ([place]) on his problem list.     has No Known Allergies.  Current Outpatient Prescriptions on File Prior to Visit  Medication Sig Dispense Refill  . Oxycodone HCl 20 MG TABS Take 1 tablet (20 mg total) by mouth 4 (four) times daily as needed (throat pain). 50 tablet 0   No current facility-administered medications on file prior to visit.    Past Surgical History  Procedure Laterality Date  . Panendoscopy N/A 08/06/2014    Procedure: PANENDOSCOPY WITH BIOPSY;  Surgeon: Leta Baptist, MD;  Location: [facility];  Service: ENT;  Laterality: N/A;  . Multiple extractions with alveoloplasty N/A 08/12/2014    Procedure: Extraction of tooth #'s 6,17,22,23,24,25,26,27 with alveoloplasty;  Surgeon: Lenn Cal, DDS;  Location: [facility];  Service: Oral Surgery;  Laterality: N/A;  . Peg placement Left 08/17/14  . Portacath placement Right 08/17/14  . Portacath placement Right 08/17/2014    Procedure: INSERTION PORT-A-CATH (procedure #2);  Surgeon: Aviva Signs Md, MD;  Location: [facility];  Service: General;  Laterality: Right;  . Peg placement N/A 08/17/2014    Procedure: PERCUTANEOUS ENDOSCOPIC GASTROSTOMY (PEG) PLACEMENT (procedure #1);  Surgeon: Aviva Signs Md, MD;  Location: [facility];  Service: General;  Laterality: N/A;  . Esophagogastroduodenoscopy (egd) with propofol N/A 08/17/2014    Procedure: ESOPHAGOGASTRODUODENOSCOPY (EGD) WITH PROPOFOL (procedure #1);  Surgeon: Aviva Signs Md, MD;  Location: [facility];  Service: General;  Laterality: N/A;    Denies any headaches, dizziness, double vision, fevers, chills, night sweats, nausea, vomiting, diarrhea, constipation, chest pain, heart palpitations, shortness of breath, blood in stool, black tarry stool, urinary pain, urinary burning, urinary frequency, hematuria.   PHYSICAL EXAMINATION  ECOG PERFORMANCE STATUS: 1 - Symptomatic but completely ambulatory  Filed Vitals:   07/21/15 0800  BP:  121/62  Pulse: 52  Temp: 98.7 F (37.1 C)  Resp: 18    GENERAL:alert, no distress, cachectic, comfortable, cooperative, smiling and unacommpanied SKIN: skin color, texture, turgor are normal, no rashes or significant lesions HEAD: Normocephalic, No masses, lesions, tenderness or abnormalities EYES: normal, PERRLA, EOMI, Conjunctiva are pink and non-injected EARS: External ears normal OROPHARYNX: dentures in place, moist mucous membranes, no thrush NECK: left neck thickening and hyperpigmentation. BREAST:not examined LUNGS: clear to auscultation without wheezes, rales, or rhonchi. HEART: regular rate & rhythm, no murmurs and no gallops ABDOMEN:abdomen soft and normal bowel sounds BACK: Back symmetric, no curvature. EXTREMITIES:less then 2 second capillary refill, no joint deformities, effusion, or inflammation, no skin discoloration  NEURO: alert & oriented x 3 with fluent speech, no focal motor/sensory deficits, gait normal  LABORATORY DATA: CBC    Component Value Date/Time   WBC 4.7 03/12/2015 1530   RBC 4.10* 03/12/2015 1530   HGB 12.8* 03/12/2015 1530   HCT 38.7* 03/12/2015 1530   PLT 229 03/12/2015 1530   MCV 94.4 03/12/2015 1530   MCH 31.2 03/12/2015 1530   MCHC 33.1 03/12/2015 1530   RDW 14.1 03/12/2015 1530   LYMPHSABS 0.8 03/12/2015 1530   MONOABS 0.5 03/12/2015 1530  EOSABS 0.2 03/12/2015 1530   BASOSABS 0.0 03/12/2015 1530      Chemistry      Component Value Date/Time   NA 142 03/12/2015 1530   K 3.8 03/12/2015 1530   CL 99* 03/12/2015 1530   CO2 28 03/12/2015 1530   BUN 18 03/12/2015 1530   CREATININE 2.37* 03/12/2015 1530      Component Value Date/Time   CALCIUM 9.8 03/12/2015 1530   ALKPHOS 57 03/12/2015 1530   AST 21 03/12/2015 1530   ALT 15* 03/12/2015 1530   BILITOT 0.5 03/12/2015 1530     Lab Results  Component Value Date   TSH 0.801 09/01/2014     PENDING LABS:   RADIOGRAPHIC STUDIES:  No results found.   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 S/P curative concomitant chemo/XRT with remission on PET imaging in October 2016.  Oncology history is updated.  He has PRN follow-up with Dr. Enrique Sack.  His dentures are in place.  He notes that he has follow-up with ENT in 1-2 months (he reports).    He is to continue follow-up with Dr. Isidore Moos ([place]) as directed.  Labs today: CBC diff, CMET.  Labs in 2 months: CBC diff, CMET, TSH.  He is not using his G-tube at all he reports.  I will refer the patient to Dr. [name] for removal.  He will [place] for now.  Patient is agreeable to this.  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  Sour hard candies are most successful for the patient.  He is recently separated from his wife of 31 years (~Feb 2017)  I will refer the patient to survivorship clinic with continued medical oncology surveillance.  Return in 2 months for labs, port flush, and follow-up appointment.   THERAPY PLAN:  NCCN guidelines for surveillance of Head and Neck cancer recommends:  A. H+P every 1-3 months for year 1  B. H+P every 2-6 months for year 2  C. H+P every 4-8 months for years 3-5  D. H+P every year for years greater than 5  E. Imaging only as clinically indicated following baseline imaging study within 6 months of completion of therapy.   F.Complete head and neck exam; mirror and fiberoptic examination as clinically indicated.  G. TSH every 6-12 months.  H. Chest imaging as clinically indicated for patients with smoking history  I. Speech/hearing and swallowing evaluation and rehabilitation as clinically indicated.  J. Smoking cessation and EtOH counseling as clinically indicated.  K. Dental evaluation: recommended for oral cavity and sites exposed to significant intraoral radiation treatment.  L. Consider EBV DNA monitoring for nasopharyngeal cancer.    All questions  were answered. The patient knows to call the clinic with any problems, questions or concerns. We can certainly see the patient much sooner if necessary.  Patient and plan discussed with Dr. Ancil Linsey and she is in agreement with the aforementioned.   This note is electronically signed by: Doy Mince 07/21/2015 9:06 AM

## 2015-07-19 NOTE — Assessment & Plan Note (Addendum)
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 S/P curative concomitant chemo/XRT with remission on PET imaging in October 2016.  Oncology history is updated.  He has PRN follow-up with Dr. Enrique Sack.  His dentures are in place.  He notes that he has follow-up with ENT in 1-2 months (he reports).    He is to continue follow-up with Dr. Isidore Moos ([place]) as directed.  Labs today: CBC diff, CMET.  Labs in 2 months: CBC diff, CMET, TSH.  He is not using his G-tube at all he reports.  I will refer the patient to Dr. [name] for removal.  He will [place] for now.  Patient is agreeable to this.  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  Sour hard candies are most successful for the patient.  He is recently separated from his wife of 31 years (~Feb 2017)  I will refer the patient to survivorship clinic with continued medical oncology surveillance.  Return in 2 months for labs, port flush, and follow-up appointment.

## 2015-07-21 ENCOUNTER — Encounter (HOSPITAL_COMMUNITY): Payer: Medicare HMO | Attending: Oncology | Admitting: Oncology

## 2015-07-21 ENCOUNTER — Encounter (HOSPITAL_BASED_OUTPATIENT_CLINIC_OR_DEPARTMENT_OTHER): Payer: Medicare HMO

## 2015-07-21 ENCOUNTER — Encounter (HOSPITAL_COMMUNITY): Payer: Self-pay | Admitting: Lab

## 2015-07-21 ENCOUNTER — Encounter (HOSPITAL_COMMUNITY): Payer: Self-pay | Admitting: Oncology

## 2015-07-21 VITALS — BP 121/62 | HR 52 | Temp 98.7°F | Resp 18 | Wt 144.8 lb

## 2015-07-21 DIAGNOSIS — C01 Malignant neoplasm of base of tongue: Secondary | ICD-10-CM | POA: Diagnosis present

## 2015-07-21 DIAGNOSIS — C109 Malignant neoplasm of oropharynx, unspecified: Secondary | ICD-10-CM | POA: Diagnosis not present

## 2015-07-21 LAB — CBC WITH DIFFERENTIAL/PLATELET
BASOS ABS: 0 10*3/uL (ref 0.0–0.1)
BASOS PCT: 0 %
EOS ABS: 0.2 10*3/uL (ref 0.0–0.7)
Eosinophils Relative: 6 %
HEMATOCRIT: 37.5 % — AB (ref 39.0–52.0)
Hemoglobin: 12.3 g/dL — ABNORMAL LOW (ref 13.0–17.0)
Lymphocytes Relative: 18 %
Lymphs Abs: 0.7 10*3/uL (ref 0.7–4.0)
MCH: 30.4 pg (ref 26.0–34.0)
MCHC: 32.8 g/dL (ref 30.0–36.0)
MCV: 92.6 fL (ref 78.0–100.0)
MONO ABS: 0.5 10*3/uL (ref 0.1–1.0)
MONOS PCT: 12 %
NEUTROS ABS: 2.5 10*3/uL (ref 1.7–7.7)
NEUTROS PCT: 64 %
Platelets: 194 10*3/uL (ref 150–400)
RBC: 4.05 MIL/uL — ABNORMAL LOW (ref 4.22–5.81)
RDW: 14.4 % (ref 11.5–15.5)
WBC: 3.9 10*3/uL — ABNORMAL LOW (ref 4.0–10.5)

## 2015-07-21 LAB — COMPREHENSIVE METABOLIC PANEL
ALBUMIN: 3.7 g/dL (ref 3.5–5.0)
ALT: 19 U/L (ref 17–63)
ANION GAP: 9 (ref 5–15)
AST: 25 U/L (ref 15–41)
Alkaline Phosphatase: 49 U/L (ref 38–126)
BILIRUBIN TOTAL: 0.6 mg/dL (ref 0.3–1.2)
BUN: 16 mg/dL (ref 6–20)
CHLORIDE: 105 mmol/L (ref 101–111)
CO2: 24 mmol/L (ref 22–32)
Calcium: 9.2 mg/dL (ref 8.9–10.3)
Creatinine, Ser: 1.88 mg/dL — ABNORMAL HIGH (ref 0.61–1.24)
GFR calc Af Amer: 42 mL/min — ABNORMAL LOW (ref >60)
GFR calc non Af Amer: 36 mL/min — ABNORMAL LOW (ref >60)
GLUCOSE: 129 mg/dL — AB (ref 65–99)
POTASSIUM: 3.7 mmol/L (ref 3.5–5.1)
SODIUM: 138 mmol/L (ref 135–145)
Total Protein: 6.8 g/dL (ref 6.5–8.1)

## 2015-07-21 MED ORDER — SODIUM CHLORIDE 0.9% FLUSH
20.0000 mL | INTRAVENOUS | Status: DC | PRN
  Administered 2015-07-21: 20 mL via INTRAVENOUS
  Filled 2015-07-21: qty 20

## 2015-07-21 MED ORDER — HEPARIN SOD (PORK) LOCK FLUSH 100 UNIT/ML IV SOLN
500.0000 [IU] | Freq: Once | INTRAVENOUS | Status: AC
  Administered 2015-07-21: 500 [IU] via INTRAVENOUS

## 2015-07-21 MED ORDER — HEPARIN SOD (PORK) LOCK FLUSH 100 UNIT/ML IV SOLN
INTRAVENOUS | Status: AC
  Filled 2015-07-21: qty 5

## 2015-07-21 NOTE — Patient Instructions (Signed)
[place] at [facility] Discharge Instructions  RECOMMENDATIONS MADE BY THE CONSULTANT AND ANY TEST RESULTS WILL BE SENT TO YOUR REFERRING PHYSICIAN.  Labs today are pending Refer to Dr. [name] for G tube removal We will keep your port in for now Referral to survivorship clinic Labs in 2 months Port flush in 2 months and to see Dr. Whitney Muse in 2 months  Thank you for choosing [place] at [facility] to provide your oncology and hematology care.  To afford each patient quality time with our provider, please arrive at least 15 minutes before your scheduled appointment time.   Beginning January 23rd 2017 lab work for the [facility] will be done in the  Main lab at [facility] on 1st floor. If you have a lab appointment with the [facility] please come in thru the  Main Entrance and check in at the main information desk  You need to re-schedule your appointment should you arrive 10 or more minutes late.  We strive to give you quality time with our providers, and arriving late affects you and other patients whose appointments are after yours.  Also, if you no show three or more times for appointments you may be dismissed from the clinic at the providers discretion.     Again, thank you for choosing [facility].  Our hope is that these requests will decrease the amount of time that you wait before being seen by our physicians.       _____________________________________________________________  Should you have questions after your visit to [facility], please contact our office at [phone] between the hours of 8:30 a.m. and 4:30 p.m.  Voicemails left after 4:30 p.m. will not be returned until the following business day.  For prescription refill requests, have your pharmacy contact our office.         Resources For Cancer Patients and their Caregivers ? American Cancer Society: Can assist with  transportation, wigs, general needs, runs Look Good Feel Better.        249-681-3634 ? Cancer Care: Provides financial assistance, online support groups, medication/co-pay assistance.  1-800-813-HOPE 581 727 0238) ? [place] Assists [place] cancer patients and their families through emotional , educational and financial support.  (959)775-0749 ? [place] DSS Where to apply for food stamps, Medicaid and utility assistance. 343-185-9200 ? [employer]: Transportation to medical appointments. 912 081 5536 ? Social Security Administration: May apply for disability if have a Stage IV cancer. ([phone]) [phone] [phone])727-1777 ? [employer], Disability and Transit Services: Assists with nutrition, care and transit needs. [phone]

## 2015-07-21 NOTE — Progress Notes (Signed)
Alexander [name] presented for Portacath access and flush. Proper placement of portacath confirmed by CXR. Portacath located right chest wall accessed with  H 20 needle. Good blood return present.  Specimen drawn for labs.   Portacath flushed with 66m NS and 500U/540mHeparin and needle removed intact. Procedure without incident. Patient tolerated procedure well.

## 2015-07-21 NOTE — Patient Instructions (Signed)
[place] at [facility] Discharge Instructions  RECOMMENDATIONS MADE BY THE CONSULTANT AND ANY TEST RESULTS WILL BE SENT TO YOUR REFERRING PHYSICIAN.  Port flush with labs today.    Thank you for choosing [place] at [facility] to provide your oncology and hematology care.  To afford each patient quality time with our provider, please arrive at least 15 minutes before your scheduled appointment time.   Beginning January 23rd 2017 lab work for the [facility] will be done in the  Main lab at [facility] on 1st floor. If you have a lab appointment with the [facility] please come in thru the  Main Entrance and check in at the main information desk  You need to re-schedule your appointment should you arrive 10 or more minutes late.  We strive to give you quality time with our providers, and arriving late affects you and other patients whose appointments are after yours.  Also, if you no show three or more times for appointments you may be dismissed from the clinic at the providers discretion.     Again, thank you for choosing [facility].  Our hope is that these requests will decrease the amount of time that you wait before being seen by our physicians.       _____________________________________________________________  Should you have questions after your visit to [facility], please contact our office at [phone] between the hours of 8:30 a.m. and 4:30 p.m.  Voicemails left after 4:30 p.m. will not be returned until the following business day.  For prescription refill requests, have your pharmacy contact our office.         Resources For Cancer Patients and their Caregivers ? American Cancer Society: Can assist with transportation, wigs, general needs, runs Look Good Feel Better.        (607)322-9304 ? Cancer Care: Provides financial assistance, online support groups, medication/co-pay assistance.   1-800-813-HOPE 502 618 9049) ? [place] Assists [place] cancer patients and their families through emotional , educational and financial support.  380-016-1691 ? [place] DSS Where to apply for food stamps, Medicaid and utility assistance. (930)492-8716 ? RCATS: Transportation to medical appointments. (330) 460-8166 ? Social Security Administration: May apply for disability if have a Stage IV cancer. 970-185-5833 [phone])444-2590 ? [employer], Disability and Transit Services: Assists with nutrition, care and transit needs. [phone]

## 2015-07-21 NOTE — Progress Notes (Signed)
Referral sent to Dr [name] for g tube removal.  Records faxed on 4/26.  They will call patient with appt

## 2015-07-27 ENCOUNTER — Ambulatory Visit (HOSPITAL_COMMUNITY): Payer: Self-pay | Admitting: Oncology

## 2015-07-27 ENCOUNTER — Encounter (HOSPITAL_COMMUNITY): Payer: Self-pay

## 2015-08-09 ENCOUNTER — Other Ambulatory Visit (HOSPITAL_COMMUNITY): Payer: Self-pay | Admitting: Oncology

## 2015-08-09 ENCOUNTER — Telehealth (HOSPITAL_COMMUNITY): Payer: Self-pay | Admitting: *Deleted

## 2015-08-09 DIAGNOSIS — C01 Malignant neoplasm of base of tongue: Secondary | ICD-10-CM

## 2015-08-09 MED ORDER — OXYCODONE HCL 20 MG PO TABS: 20.0000 mg | ORAL_TABLET | Freq: Four times a day (QID) | ORAL | Status: DC | PRN

## 2015-08-09 NOTE — Telephone Encounter (Signed)
Printed.  TK

## 2015-08-16 ENCOUNTER — Encounter: Payer: Self-pay | Admitting: Adult Health

## 2015-08-16 NOTE — Progress Notes (Unsigned)
Survivorship Care Plan  Provided by [name], NP on 08/17/2015     General Information  Patient name Alexander [name]   Patient ID [id number]  Date of birth 02/27/1951     Your Care Team  Patient Care Team: No PCP per patient Leta Baptist, MD - Otolaryngology (ENT) Eppie Gibson, MD - Radiation Oncologist Ancil Linsey, MD - Medical Oncologist Kirby Crigler, [place] Physician Assistant, Medical Oncology Teena Dunk, DDS - Dentist Loren Racer, [place] Worker [name], NP - Survivorship Nurse Practitioner   *To reach your [facility] providers, call [phone].   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   07/28/2014 Initial Diagnosis Oropharyngeal cancer   08/03/2014 Imaging CT CAP- L supraclavicular lymphadenopathy is not completely visualized. This is better seen on the previous neck CT from 07/27/2014. Otherwise, no evidence for metastatic disease in the chest, abdomen, or pelvis.   08/03/2014 Imaging Bone scan- Uptake at adjacent anterior LEFT 6, 7, 8 ribs likely representing trauma/fractures. Questionable nonspecific increased tracer localization at the posterior RIGHT 8th and 9th ribs, the adjacent nature which raises a a question of trauma as well   08/06/2014 Pathology Results Dr. Benjamine Mola- Oropharynx, biopsy, Left - INVASIVE SQUAMOUS CELL CARCINOMA.    08/12/2014 Procedure Dr. Enrique Sack- 1. Multiple extraction of tooth numbers 6, 17, 22, 23, 24, 25, 26, and 27. 3 Quadrants of alveoloplasty   08/17/2014 Pathology Results PORT and G-TUBE placed by Dr. Carlis Stable.   08/26/2014 PET scan Large hypermetabolic mass in the left base of tongue. Activity extends across midline to the right base tongue. 2. Intensely hypermetabolic left cervical metastatic lymph nodes. Lymph nodes extend from the left level II position to the left supraclavi   09/01/2014 - 10/23/2014 Chemotherapy Concurrent chemoradiation with Cisplatin 100 mg/m2 x 2 cycles with Neulasta support. Held cycle #3 d/t renal toxicity.    09/03/2014 - 10/23/2014 Radiation Therapy Treated in [place], IMRT Isidore Moos).  Base of tongue and bilat neck. Total dose: 70 Gy in 35 fractions. (of note, he did miss several treatments requiring BID dosing towards the end of treatment).    01/25/2015 PET scan Near complete resolution of metabolic activity at the base of tongue. Minimal residual activity is likely post treatment effect. 2. Complete resolution of metabolic activity above LEFT cervical lymph nodes. No evidence of residual metabolically active         Treatment goal Curative    RADIATION THERAPY:  Location of Radiation Base of tongue and bilateral neck  Radiation Period  Start Date: 09/03/14               End Date: 10/23/14  Number of Radiation Treatments 35 treatments  Total Radiation Dose Base of tongue: 70 Gy Neck lymph nodes (high risk): 63 Gy Neck lymph nodes (intermediate risk): 56 Gy   Potential Late &  Long-Term Side Effects of Head & Neck Radiation Therapy   Fatigue  Dry mouth  Altered taste or loss of taste  Trouble swallowing  Jaw muscle spasms/pain  Skin changes in the area treated with radiation  Neck muscle spasms/pain  Lymphedema (swelling in the face and/or neck)  Hypothyroidism (sluggish thyroid gland)  ***Side effects from radiation therapy are dependent on the areas of the body  in the treatment field.  These are the most common late/long-term effects for patients treated for base of tongue cancer.                              CHEMOTHERAPY: Chemotherapy Given With Radiation Therapy Yes   Drugs Received Date Started Date Completed # cycles (number of times drug was received) Comments  '[x]'$   High-dose Cisplatin  09/01/14  09/22/14 2 Cycle #3 held due to renal (kidney) toxicity.     Potential Late & Long-Term Side Effects of Chemotherapy   Fatigue  Neuropathy (numbness/tingling in hands or feet)  Hearing loss (Cisplatin only)  Infertility  Kidney damage/kidney failure  Liver problems  Rare secondary cancers    NUTRITIONAL SUPPORT: Placement of Feeding Tube Yes  Date of Placement 08/17/14  Expected Date of Removal Soon with Dr. [name]  Placement of Port-a-Cath Yes  Date of Placement  08/17/14  Expected Date of Removal No plans to remove port yet per Dr. Waynette Buttery                     Survivorship Care & Follow-up Schedule   Becoming a cancer survivor is a time of great celebration, but also a time of many questions and concerns.  The following information and resources are for you, your caregivers, and your primary care doctor to better understand the needs of a cancer survivor.    How often will I see my cancer providers: Survivor Years 0-2  (Survivor Years defined by length of time since cancer diagnosis)   When?  Responsible Provider  Medical Oncology visits As directed Dr. Daria Pastures  Radiation Oncology visits As directed Dr. Isidore Moos  Otolaryngologist (ENT) visits As directed  Dr. Benjamine Mola  Lab tests [facility]) Every 6-12 months Dr. Whitney Muse or Dr. Isidore Moos  Scans/Imaging exams None unless you have new symptoms or concerns that warrant additional imaging.  Dr. Vira Blanco. [place]/Dr. Benjamine Mola  Dietician As directed Registered Dietitian  Speech Therapist As directed Speech & Language Pathologist  Social Worker As  needed Loren Racer, LCSW  Survivorship Nurse Practitioner (NP) After treatment and as needed  [name], NP      How often will I see my cancer providers: Survivor Years 2-5 (Survivor Years defined by length of time since cancer diagnosis)  When?  Responsible Provider  Radiation  Oncology visits As directed Dr. Isidore Moos  ENT visits As directed Dr. Benjamine Mola  Medical Oncology visits  As directed Dr. Whitney Muse  Survivorship Nurse Practitioner (NP) As needed  [name], NP   5 Years: GRADUATION!  [name], NP  Lab tests [facility]) Every 6-12 months  Dr. Vira Blanco. Ivonne Andrew, NP          Common Complaints/Concerns of Cancer Survivors  *Patients often worry if what they are experiencing is normal, or to be expected, given their cancer history and treatment.  Below are common complaints & concerns reported by cancer survivors.     The following are common complaints or concerns for cancer survivors:   Fatigue  Memory problems and/or confusion  Depression  Anxiety  Insomnia or trouble sleeping  Weight loss  Changes in your eating habits, your taste, or your smell  Lymphedema (swelling in your neck or face)  Decreased range of motion (difficulty moving neck)  Intimacy and sexuality  Marital/partner/family relationships  Employment, health insurance concerns, and/or finances  Concerns regarding spirituality  Exercise after cancer treatments                               Symptoms to Watch For and Report to Your Provider  *Often cancer survivors are fearful about their cancer coming back or being diagnosed with a new cancer.  Below are symptoms that you and your loved ones should watch for and report to your provider.   Symptoms to Watch For and Report to Your Provider .  Return of the cancer symptoms you had before- such as a lump or new growth where your cancer first started . New or unusual pain that seems unrelated to  an injury and does not go away, including back pain or bone pain . Weight loss without trying/intending . Unexplained bleeding . A rash or allergic reaction, such as swelling, severe itching or wheezing . Chills or fevers . Persistent headaches . Shortness of breath or difficulty breathing . Bloody Stools or blood in your urine . Lumps, bumps, swelling and/or nipple discharge . Nausea, vomiting, diarrhea, loss of appetite, or trouble swallowing . A cough that doesn't go away . Abdominal pain . Swelling in your arms or legs . Fractures . Hot flashes or other menopausal symptoms . Any other signs mentioned by your doctor or nurse or any unusual symptoms                 that you just can't explain   NOTE: Just because you have certain symptoms, it doesn't mean the cancer has come back or you have a new cancer. Symptoms can be due to other problems that need to be addressed.  It is important to watch for these symptoms and report them to your provider so you can be medically evaluated for any of these concerns!      What Now?  Thriving & Surviving In Your Life After Cancer   Consider participating in "Finding Your New Normal" [facility]) survivorship series.   [employer] fitness program for cancer survivors.   Keep your follow-up appointments with all of your specialists (Speech Therapist, Dietician, Physical Therapist, Dentist, etc.)  Consider FREE counseling at cancer center in [place].  For more information, contact Ottis Stain at [phone].  Attend support group and other Patient & [place] class/program offerings.   Stop smoking or continue to abstain from smoking. If you need help with smoking cessation, talk to your healthcare team about different options to help you!  Limit alcohol consumption or abstain from consuming alcohol all together.   Maintain adequate nutrition & fluid intake    Thank you so much for allowing [facility] to care  for you during your cancer experience.  Our continued commitment is to you and your caregiver(s) health and happiness and again, Congratulations!     Survivorship Nurse Practitioner contact:  [name], NP Survivorship Program [place] [phone]

## 2015-08-17 ENCOUNTER — Encounter (HOSPITAL_COMMUNITY): Payer: Medicare HMO | Attending: Oncology | Admitting: Adult Health

## 2015-08-17 ENCOUNTER — Encounter (HOSPITAL_COMMUNITY): Payer: Self-pay | Admitting: Adult Health

## 2015-08-17 VITALS — BP 127/67 | HR 53 | Temp 98.2°F | Resp 14 | Wt 141.6 lb

## 2015-08-17 DIAGNOSIS — C109 Malignant neoplasm of oropharynx, unspecified: Secondary | ICD-10-CM

## 2015-08-17 DIAGNOSIS — C01 Malignant neoplasm of base of tongue: Secondary | ICD-10-CM | POA: Diagnosis not present

## 2015-08-17 DIAGNOSIS — R432 Parageusia: Secondary | ICD-10-CM

## 2015-08-17 DIAGNOSIS — R634 Abnormal weight loss: Secondary | ICD-10-CM | POA: Diagnosis not present

## 2015-08-17 DIAGNOSIS — K117 Disturbances of salivary secretion: Secondary | ICD-10-CM

## 2015-08-17 DIAGNOSIS — Z1211 Encounter for screening for malignant neoplasm of colon: Secondary | ICD-10-CM

## 2015-08-17 NOTE — Progress Notes (Signed)
[place], [place] [address]   CLINIC:  Survivorship  REASON FOR VISIT:  Survivorship Care Plan visit & to address acute survivorship needs.   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   07/28/2014 Initial Diagnosis Oropharyngeal cancer   08/03/2014 Imaging CT CAP- L supraclavicular lymphadenopathy is not completely visualized. This is better seen on the previous neck CT from 07/27/2014. Otherwise, no evidence for metastatic disease in the chest, abdomen, or pelvis.   08/03/2014 Imaging Bone scan- Uptake at adjacent anterior LEFT 6, 7, 8 ribs likely representing trauma/fractures. Questionable nonspecific increased tracer localization at the posterior RIGHT 8th and 9th ribs, the adjacent nature which raises a a question of trauma as well   08/06/2014 Pathology Results Dr. Benjamine Mola- Oropharynx, biopsy, Left - INVASIVE SQUAMOUS CELL CARCINOMA.   08/12/2014 Procedure Dr. Enrique Sack- 1. Multiple extraction of tooth numbers 6, 17, 22, 23, 24, 25, 26, and 27. 3 Quadrants of alveoloplasty   08/17/2014 Pathology Results PORT and G-TUBE placed by Dr. Carlis Stable.   08/26/2014 PET scan Large hypermetabolic mass in the left base of tongue. Activity extends across midline to the right base tongue. 2. Intensely hypermetabolic left cervical metastatic lymph nodes. Lymph nodes extend from the left level II position to the left supraclavi   09/01/2014 - 09/22/2014 Chemotherapy Concurrent chemoradiation with Cisplatin 100 mg/m2 x 2 cycles with Neulasta support. Held cycle #3 d/t renal toxicity.    09/03/2014 - 10/23/2014 Radiation Therapy Treated in [place], IMRT Isidore Moos).  Base of tongue and bilat neck. Total dose: 70 Gy in 35 fractions. (of note, he did miss several treatments requiring BID dosing towards the end of treatment).    01/25/2015 PET scan Near  complete resolution of metabolic activity at the base of tongue. Minimal residual activity is likely post treatment effect. 2. Complete resolution of metabolic activity above LEFT cervical lymph nodes. No evidence of residual metabolically active      INTERVAL HISTORY:  Alexander [name] reports feeling quite well since he completed treatment about 10 months ago.  He is still using the oxycodone for intermittent throat pain.  He got new dentures, which he is still getting used to.  He is eating several small frequent meals throughout the day and is tolerating that well.  He got his feeding tube removed last week with Dr. [name] and that went very well per patient.  Still having some xerostomia, but is managing this with Biotene products and sour candy.  His taste is also improving, particularly on the left side of his tongue.  He saw Dr. Benjamine Mola (ENT) last in 02/2015.  He is not sure the last time he saw Dr. Isidore Moos (Radiation Oncology).  He tells me that he has found a PCP, Dr. Consuello Masse, in [place]. Mr. Grenda is concerned about having a colonoscopy. He states that this was recommended by Dr. Isidore Moos and he met with a GI doctor about the procedure, but he was reluctant to schedule the procedure as he has more questions at this time.  He is not sure when he was evaluated by this doctor nor the doctor's name.  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    ENT: Dr. Benjamine Mola; last visit 02/2015 Rad Onc: Dr. Isidore Moos; patient unsure  of last visit Med Onc: Tom Kefalas, PA; last visit 07/21/15 Dentistry: Edentulous; now has full dentures  Pain: Managed with oxycodone 20 mg; prescription provided by medical oncology Nutrition: PEG removed last week; tolerated procedure well. Maintaining regular diet with no symptoms of dysphagia. Some xerostomia and dysgeusia, but these are improving.  Thyroid: Last TSH 0.801 on 09/01/14 Imaging: PET scan on 01/25/15;  IMPRESSION: 1. Near complete resolution of metabolic activity at the base of tongue. Minimal residual activity is likely post treatment effect. 2. Complete resolution of metabolic activity above LEFT cervical lymph nodes. No evidence of residual metabolically active lymph nodes. 3. No evidence of distant metastasis. 4. Persistent intense uptake within the posterior RIGHT aspect of the prostate gland which corresponds to the nodule on comparison CT of 11/27/2014. Recommend correlation with PSA level and digital rectal exam.   ADDITIONAL REVIEW OF SYSTEMS:  Review of Systems  Constitutional: Negative for malaise/fatigue.  Cardiovascular: Negative for chest pain.  Gastrointestinal: Negative for nausea, vomiting, diarrhea and constipation.       Having bowel movements every other day   Neurological: Negative for dizziness, weakness and headaches.  Psychiatric/Behavioral: Negative for depression. The patient is not nervous/anxious.      PAST MEDICAL & SURGICAL HISTORY:  Past Medical History  Diagnosis Date  . 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 3 weeks ago.- Dr. Oneal [facility] [place], [place].  . 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 Panendoscopy N/A 08/06/2014    Procedure: PANENDOSCOPY WITH BIOPSY;  Surgeon: Leta Baptist, MD;  Location: [facility];  Service: ENT;  Laterality: N/A;  . Multiple extractions with alveoloplasty N/A 08/12/2014    Procedure: Extraction of tooth #'s 6,17,22,23,24,25,26,27 with alveoloplasty;  Surgeon: Lenn Cal, DDS;  Location: [facility];  Service: Oral Surgery;  Laterality: N/A;  . Peg placement Left 08/17/14  . Portacath placement Right 08/17/14  . Portacath placement Right 08/17/2014    Procedure: INSERTION PORT-A-CATH (procedure #2);  Surgeon: Aviva Signs Md, MD;  Location: [facility];  Service: General;  Laterality: Right;  . Peg placement N/A 08/17/2014    Procedure: PERCUTANEOUS ENDOSCOPIC GASTROSTOMY (PEG) PLACEMENT (procedure #1);  Surgeon: Aviva Signs Md, MD;  Location: [facility];  Service: General;  Laterality: N/A;  . Esophagogastroduodenoscopy (egd) with propofol N/A 08/17/2014    Procedure: ESOPHAGOGASTRODUODENOSCOPY (EGD) WITH PROPOFOL (procedure #1);  Surgeon: Aviva Signs Md, MD;  Location: [facility];  Service: General;  Laterality: N/A;     SOCIAL HISTORY: Mr. Busic is separated from his wife (since 04/2015).  He currently lives with a friend in his home in [place].  He does not have any biological children. 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  He really enjoys this work and is very proud of his own ~3 acre property (he showed me pictures today of his manicured lawn at his home).  He also enjoys old cars.    CURRENT MEDICATIONS:  Current Outpatient Prescriptions on File Prior to Visit  Medication Sig Dispense Refill  . Oxycodone HCl 20 MG TABS Take 1 tablet (20 mg total) by mouth 4 (four) times daily as needed (throat pain). 40 tablet 0   No current  facility-administered medications on file prior to visit.    ALLERGIES: No Known Allergies  PHYSICAL EXAM:  Filed Vitals:   08/17/15 1010  BP: 127/67  Pulse: 53  Temp: 98.2 F (36.8 C)  Resp: 14   Filed Weights   08/17/15 1010  Weight: 141 lb 9.6 oz (64.229 kg)  Previous weight:144 lb 12.8 oz (65.681 kg) on 07/21/15      149 lb 6.4 oz (67.767 kg) on 03/12/15  General: Chronically-ill appearing male in no acute distress. Unaccompanied today. HEENT: Head is normocephalic.  Pupils equal and reactive to light. Conjunctivae clear without exudate.  Sclerae anicteric. Oral mucosa is pink and moist without lesions. Oropharynx is pink and moist. No thrush. Lymph: No cervical,  supraclavicular, or infraclavicular lymphadenopathy noted on palpation.   Cardiovascular: Normal rate and rhythm Respiratory: Clear to auscultation bilaterally. Chest expansion symmetric without accessory muscle use. Breathing non-labored.    GU: Deferred.   GI: Soft, non-tender abdomen. Normoactive bowel sounds.  Neuro: No focal deficits. Steady gait.   Psych: Normal mood and affect for situation. Extremities: No edema   Skin: Warm and dry.   LABORATORY DATA: None for this visit.   DIAGNOSTIC IMAGING:  None for this visit.    ASSESSMENT & PLAN:  [name] is a pleasant 65 y.o. 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  Patient presents to survivorship clinic today for survivorship care plan visit and to address any acute survivorship concerns since completing treatment.    1. 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 Herda is continuing to recover from the effects of treatment. Today, he received a copy of his survivorship care plan (SCP) document, which was reviewed with him.  The SCP details his cancer treatment history and potential late/long-term side effects of those treatments.  We discussed the follow-up schedule he can anticipate with interval imaging for surveillance of his cancer.  I have also shared a copy of his treatment summary/SCP with his PCP.  Mr. Ohair will return to the survivorship clinic as needed; he will return to [facility] for surveillance visit with Dr. Whitney Muse in 08/2015.  2. Weight loss: Mr. Hunnell has lost about 8 pounds in the past 5 months.  I encouraged him to continue to work on eating several small frequent meals throughout the day, particularly those foods that are high in calories and protein.  My hope is that his weight loss will stabilize further as his dry mouth and taste continue to improve.    3.  Xerostomia/Dysgeusia/Oral health:  It is not uncommon for head & neck cancer patients to experience dry mouth and taste changes as a result of treatment.  His symptoms are slowly improving. His current interventions with Biotene products and sour candies are helping.  His taste is improving as well. I provided reassurance that our hope is that his taste will continue to improve as he continues to recover from treatment; it is encouraging he is experiencing mild progress with these symptoms.  He is edentulous and now has a new full set of dentures.  He understands the importance of good oral hygiene.   4. Pain/Constipation prevention: Mr. Selsor continues to experience occasional sore throat, requiring oxycodone 20 mg as needed.  We discussed the importance of adequate bowel regimen to prevent constipation in the setting of opiate use.  He currently is having bowel movements every other day and I encouraged him  to consider starting a stool softener or take a laxative if he does not have a BM at least every other day while on pain medications. He voiced understanding.   5. At risk for hypothyroidism: The thyroid gland is often affected after treatment for head & neck cancer.  His most recent TSH was 0.801 on 09/01/14.  Ideally, his thyroid function should be monitored every 6-12 months. He has lab orders to be completed sometime in 08/2015 with subsequent medical oncology follow-up appointments. He is not symptomatic of hypothyroidism at this time.   6. At risk for dysphagia: Given Mr. Schwartz treatment for oropharyngeal/left base of tongue cancer, which included concurrent chemoradiation, he is at risk for chronic dysphagia.  Currently, he is not experiencing any symptoms of dysphagia.  I encouraged him to maintain his follow-up appts, as needed, with his speech therapist.  He understands the importance of continuing to do his swallowing exercises to help prevent dysphagia in the future. No interventions needed  at this time.   7. At risk for lymphedema: When patients with head & neck cancers are treated with surgery and/or radiation therapy, there is an associated increased risk of neck lymphedema.  On physical exam, there is no evidence of lymphedema.  He understands that a referral to physical therapy can be placed to perform massage technique in the future, if needed. No interventions needed now.   8. Tobacco/Alcohol use: I commended Mr. Rathbun continued efforts to remain tobacco and alcohol-free.  We discussed that one of the most important risk reduction strategies in preventing cancer recurrence in cancer patients is smoking & alcohol cessation.  He is committed to abstaining from tobacco and alcohol.  9. Physical activity/Healthy eating: Getting adequate physical activity and maintaining a healthy diet as a cancer survivor is important for overall wellness and reduces the risk of cancer recurrence. 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  He understands the importance of physical activity in his overall health.  We also discussed how critical healthy nutrition is for overall wellness.  We reviewed "The Nutrition Rainbow" handout, as well as the American Cancer Society's booklet with recommendations for nutrition and physical activity.  I encouraged him to keep up the good work with regards to his physical activity and nutrition.  10. Colon cancer screening:  Mr. Lank is interested in having a colonoscopy, but has some questions after meeting with a GI specialist. I provided reassurance regarding the procedure, the purpose/importance of the procedure in detecting colon precancerous and cancer lesions.  He voiced understanding and gratitude for answering his questions. I have placed a referral back to GI today so that a screening colonoscopy can be scheduled.   He would like to have this procedure done before his birthday in July.    11. Support services/Counseling: It is not uncommon for this period of the patient's cancer care trajectory to be one of many emotions and stressors.  I provided support today through active listening, validation of concerns, and expressive supportive counseling.  Mr. Rocchi was encouraged to take advantage of our support services programs and support group to better cope in his new life as a cancer survivor after completing anti-cancer treatment.   Dispo:  -See Dr. Whitney Muse in 08/2015 for continued surveillance  -Return to survivorship clinic as needed; no additional follow-up needed at this time.  -Consider transitioning the patient to long-term survivorship, when clinically appropriate.  A total of 35 minutes was spent in face-to-face care of this patient, with greater than 50% of that time spent in counseling and care coordination.   [name], NP Survivorship Program [place] [phone]

## 2015-09-09 ENCOUNTER — Telehealth (HOSPITAL_COMMUNITY): Payer: Self-pay | Admitting: *Deleted

## 2015-09-10 ENCOUNTER — Encounter (HOSPITAL_BASED_OUTPATIENT_CLINIC_OR_DEPARTMENT_OTHER): Payer: Medicare Other

## 2015-09-10 ENCOUNTER — Other Ambulatory Visit (HOSPITAL_COMMUNITY): Payer: Self-pay | Admitting: Oncology

## 2015-09-10 ENCOUNTER — Encounter (HOSPITAL_COMMUNITY): Payer: Medicare Other | Attending: Oncology | Admitting: Hematology & Oncology

## 2015-09-10 VITALS — BP 122/82 | HR 62 | Temp 98.6°F | Resp 20 | Wt 145.0 lb

## 2015-09-10 DIAGNOSIS — D649 Anemia, unspecified: Secondary | ICD-10-CM

## 2015-09-10 DIAGNOSIS — C01 Malignant neoplasm of base of tongue: Secondary | ICD-10-CM

## 2015-09-10 DIAGNOSIS — C109 Malignant neoplasm of oropharynx, unspecified: Secondary | ICD-10-CM

## 2015-09-10 DIAGNOSIS — R634 Abnormal weight loss: Secondary | ICD-10-CM

## 2015-09-10 LAB — CBC WITH DIFFERENTIAL/PLATELET
Basophils Absolute: 0 10*3/uL (ref 0.0–0.1)
Basophils Relative: 0 %
EOS ABS: 0.1 10*3/uL (ref 0.0–0.7)
EOS PCT: 3 %
HCT: 36.9 % — ABNORMAL LOW (ref 39.0–52.0)
Hemoglobin: 12.6 g/dL — ABNORMAL LOW (ref 13.0–17.0)
LYMPHS ABS: 0.9 10*3/uL (ref 0.7–4.0)
LYMPHS PCT: 22 %
MCH: 31.2 pg (ref 26.0–34.0)
MCHC: 34.1 g/dL (ref 30.0–36.0)
MCV: 91.3 fL (ref 78.0–100.0)
MONOS PCT: 12 %
Monocytes Absolute: 0.5 10*3/uL (ref 0.1–1.0)
Neutro Abs: 2.6 10*3/uL (ref 1.7–7.7)
Neutrophils Relative %: 63 %
PLATELETS: 197 10*3/uL (ref 150–400)
RBC: 4.04 MIL/uL — ABNORMAL LOW (ref 4.22–5.81)
RDW: 14.6 % (ref 11.5–15.5)
WBC: 4.1 10*3/uL (ref 4.0–10.5)

## 2015-09-10 LAB — COMPREHENSIVE METABOLIC PANEL
ALK PHOS: 63 U/L (ref 38–126)
ALT: 17 U/L (ref 17–63)
ANION GAP: 7 (ref 5–15)
AST: 22 U/L (ref 15–41)
Albumin: 4.2 g/dL (ref 3.5–5.0)
BUN: 25 mg/dL — ABNORMAL HIGH (ref 6–20)
CALCIUM: 9.1 mg/dL (ref 8.9–10.3)
CO2: 24 mmol/L (ref 22–32)
CREATININE: 2.16 mg/dL — AB (ref 0.61–1.24)
Chloride: 106 mmol/L (ref 101–111)
GFR, EST AFRICAN AMERICAN: 35 mL/min — AB (ref >60)
GFR, EST NON AFRICAN AMERICAN: 30 mL/min — AB (ref >60)
Glucose, Bld: 117 mg/dL — ABNORMAL HIGH (ref 65–99)
Potassium: 4.3 mmol/L (ref 3.5–5.1)
SODIUM: 137 mmol/L (ref 135–145)
Total Bilirubin: 0.7 mg/dL (ref 0.3–1.2)
Total Protein: 7.2 g/dL (ref 6.5–8.1)

## 2015-09-10 LAB — TSH: TSH: 2.251 u[IU]/mL (ref 0.350–4.500)

## 2015-09-10 MED ORDER — HEPARIN SOD (PORK) LOCK FLUSH 100 UNIT/ML IV SOLN
500.0000 [IU] | Freq: Once | INTRAVENOUS | Status: AC
  Administered 2015-09-10: 500 [IU] via INTRAVENOUS

## 2015-09-10 MED ORDER — OXYCODONE HCL 20 MG PO TABS: 20.0000 mg | ORAL_TABLET | Freq: Four times a day (QID) | ORAL | Status: DC | PRN

## 2015-09-10 MED ORDER — HEPARIN SOD (PORK) LOCK FLUSH 100 UNIT/ML IV SOLN
INTRAVENOUS | Status: AC
  Filled 2015-09-10: qty 5

## 2015-09-10 MED ORDER — SODIUM CHLORIDE 0.9% FLUSH
10.0000 mL | Freq: Once | INTRAVENOUS | Status: AC
  Administered 2015-09-10: 10 mL via INTRAVENOUS

## 2015-09-10 NOTE — Patient Instructions (Signed)
[place] [facility] Discharge Instructions  RECOMMENDATIONS MADE BY THE CONSULTANT AND ANY TEST RESULTS WILL BE SENT TO YOUR REFERRING PHYSICIAN.  Exam done and seen today by Dr. Whitney Muse Labs in 4 months Return to see the doctor in 4 months Please call the clinic if you have any questions or concerns  Thank you for choosing [place] at [facility] to provide your oncology and hematology care.  To afford each patient quality time with our provider, please arrive at least 15 minutes before your scheduled appointment time.   Beginning January 23rd 2017 lab work for the [facility] will be done in the  Main lab at [facility] on 1st floor. If you have a lab appointment with the [facility] please come in thru the  Main Entrance and check in at the main information desk  You need to re-schedule your appointment should you arrive 10 or more minutes late.  We strive to give you quality time with our providers, and arriving late affects you and other patients whose appointments are after yours.  Also, if you no show three or more times for appointments you may be dismissed from the clinic at the providers discretion.     Again, thank you for choosing [facility].  Our hope is that these requests will decrease the amount of time that you wait before being seen by our physicians.       _____________________________________________________________  Should you have questions after your visit to [facility], please contact our office at [phone] between the hours of 8:30 a.m. and 4:30 p.m.  Voicemails left after 4:30 p.m. will not be returned until the following business day.  For prescription refill requests, have your pharmacy contact our office.         Resources For Cancer Patients and their Caregivers ? American Cancer Society: Can assist with transportation, wigs, general needs, runs Look Good Feel Better.         8053664999 ? Cancer Care: Provides financial assistance, online support groups, medication/co-pay assistance.  1-800-813-HOPE 848-141-2552) ? [facility] Assists [place] cancer patients and their families through emotional , educational and financial support.  478-060-8001 ? [place] DSS Where to apply for food stamps, Medicaid and utility assistance. 860-760-7004 ? [facility]: Transportation to medical appointments. 575-067-1352 ? Social Security Administration: May apply for disability if have a Stage IV cancer. (413)650-6830 432-271-0516 ? [employer], Disability and Transit Services: Assists with nutrition, care and transit needs. [place] Support Programs: '@10RELATIVEDAYS'$ @ > Cancer Support Group  2nd Tuesday of the month 1pm-2pm, Journey Room  > Creative Journey  3rd Tuesday of the month 1130am-1pm, Journey Room  > Look Good Feel Better  1st Wednesday of the month 10am-12 noon, Journey Room (Call [place] to register [phone])

## 2015-09-10 NOTE — Progress Notes (Signed)
Alexander [name] presented for Portacath access and flush. Proper placement of portacath confirmed by CXR. Portacath located rt chest wall accessed with  H 20 needle. Good blood return present. Portacath flushed with 58m NS and 500U/538mHeparin and needle removed intact. Procedure without incident. Patient tolerated procedure well.

## 2015-09-10 NOTE — Progress Notes (Signed)
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  Oropharyngeal cancer   Staging form: Pharynx - Oropharynx, AJCC 7th Edition     Clinical: Stage IVA (T4a, N2b, M0) - Unsigned    Oropharyngeal cancer ([place])   07/27/2014 Imaging CT neck- Advanced stage oropharyngeal cancer with necrotic adenopathy accounting for the left neck swelling.   07/28/2014 Initial Diagnosis Oropharyngeal cancer   08/03/2014 Imaging CT CAP- L supraclavicular lymphadenopathy is not completely visualized. This is better seen on the previous neck CT from 07/27/2014. Otherwise, no evidence for metastatic disease in the chest, abdomen, or pelvis.   08/03/2014 Imaging Bone scan- Uptake at adjacent anterior LEFT 6, 7, 8 ribs likely representing trauma/fractures. Questionable nonspecific increased tracer localization at the posterior RIGHT 8th and 9th ribs, the adjacent nature which raises a a question of trauma as well   08/06/2014 Pathology Results Dr. Benjamine Mola- Oropharynx, biopsy, Left - INVASIVE SQUAMOUS CELL CARCINOMA.   08/12/2014 Procedure Dr. Enrique Sack- 1. Multiple extraction of tooth numbers 6, 17, 22, 23, 24, 25, 26, and 27. 3 Quadrants of alveoloplasty   08/17/2014 Pathology Results PORT and G-TUBE placed by Dr. Carlis Stable.   08/26/2014 PET scan Large hypermetabolic mass in the left base of tongue. Activity extends across midline to the right base tongue. 2. Intensely hypermetabolic left cervical metastatic lymph nodes. Lymph nodes extend from the left level II position to the left supraclavi   09/01/2014 - 09/22/2014 Chemotherapy Concurrent chemoradiation with Cisplatin 100 mg/m2 x 2 cycles with Neulasta support. Held cycle #3 d/t renal toxicity.    09/03/2014 - 10/23/2014 Radiation Therapy Treated in [place], IMRT Isidore Moos).  Base of tongue and bilat neck. Total dose: 70 Gy in 35 fractions. (of note, he did miss several treatments requiring BID dosing towards the end of treatment).    01/25/2015 PET scan Near complete resolution of metabolic activity at the base of tongue. Minimal residual activity is likely post treatment effect. 2. Complete resolution of metabolic activity above LEFT cervical lymph nodes. No evidence of residual metabolically active      CURRENT THERAPY:  Observation  INTERVAL HISTORY: Alexander [name] 65 y.o. 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   Alexander [name] is unaccompanied.  He had his feeding tube removed. He is eating everything by mouth. He has been using lemon drops and Biotine. He reports a good range of motion in his neck and 95% in his tongue. He is still adjusting to wearing dentures, it feels like "too much in your mouth".   He has been to see Alexander Craze, NP of survivorship on 08/17/2015 and has not seen Dr. Isidore Moos for the last few months. He missed his recent appointment with Dr. Benjamine Mola and plans to make an appointment after Dr. Benjamine Mola returns from out of town.  When he saw survivorship, he was set up for a colonoscopy. He feels uncertain about this.   He notes that he is now separated from his wife but adjusting.     Past Medical History  Diagnosis Date  . Oropharyngeal cancer ([place]) 07/28/2014    dx. 3 weeks ago.- Dr. Oneal Deputy center [place], [place].  . 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     has No Known Allergies.  Current Outpatient Prescriptions on File Prior to Visit  Medication Sig Dispense Refill  . Oxycodone HCl 20 MG TABS Take 1 tablet (20 mg total) by mouth 4 (four) times daily as needed (throat pain). 40 tablet 0   No current facility-administered medications on file prior to visit.    Past Surgical History  Procedure Laterality Date  . Panendoscopy N/A 08/06/2014    Procedure:  PANENDOSCOPY WITH BIOPSY;  Surgeon: Leta Baptist, MD;  Location: [facility];  Service: ENT;  Laterality: N/A;  . Multiple extractions with alveoloplasty N/A 08/12/2014    Procedure: Extraction of tooth #'s 6,17,22,23,24,25,26,27 with alveoloplasty;  Surgeon: Lenn Cal, DDS;  Location: [facility];  Service: Oral Surgery;  Laterality: N/A;  . Peg placement Left 08/17/14  . Portacath placement Right 08/17/14  . Portacath placement Right 08/17/2014    Procedure: INSERTION PORT-A-CATH (procedure #2);  Surgeon: Aviva Signs Md, MD;  Location: [facility];  Service: General;  Laterality: Right;  . Peg placement N/A 08/17/2014    Procedure: PERCUTANEOUS ENDOSCOPIC GASTROSTOMY (PEG) PLACEMENT (procedure #1);  Surgeon: Aviva Signs Md, MD;  Location: [facility];  Service: General;  Laterality: N/A;  . Esophagogastroduodenoscopy (egd) with propofol N/A 08/17/2014    Procedure: ESOPHAGOGASTRODUODENOSCOPY (EGD) WITH PROPOFOL (procedure #1);  Surgeon: Aviva Signs Md, MD;  Location: [facility];  Service: General;  Laterality: N/A;    SOCIAL HISTORY: Alexander [name] is separated from his wife (since 04/2015). He currently lives with a friend in his home in [place]. He does not have any biological children. 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 He really enjoys this work and is very proud of his own ~3 acre property  He also enjoys old cars.   FAMILY HISTORY: Non contributory  Denies any headaches, dizziness, double vision, fevers, chills, night sweats, nausea, vomiting, diarrhea, constipation, chest pain, heart palpitations, shortness of breath, blood in stool, black tarry stool, urinary pain, urinary burning, urinary frequency, hematuria. 14 point review of systems was performed and is negative except as detailed under history of present illness and above  PHYSICAL EXAMINATION  ECOG PERFORMANCE STATUS: 0 -  Asymptomatic  Filed Vitals:   09/10/15 1051  BP: 122/82  Pulse: 62  Temp: 98.6 F (37 C)  Resp: 20    GENERAL:alert, no distress, well nourished, well developed, comfortable, cooperative, smiling  SKIN: skin color, texture, turgor are normal, no rashes or significant lesions HEAD: Normocephalic EYES: normal, EOMI, Conjunctiva are pink and non-injected EARS: External ears normal OROPHARYNX:lips, buccal mucosa, and mucous membranes are moist Dentures in place.  NECK: No palpable nodularity. Chronic XRT changes LYMPH:  As above BREAST:not examined LUNGS: Clear to auscultation HEART: Regular rate and rhythm ABDOMEN:abdomen soft and normal bowel sounds, No HSM no rebound or guarding. Prior FT site is C/D/I.  BACK: Back symmetric, no curvature. EXTREMITIES:less then 2 second capillary refill, no joint deformities, effusion, or inflammation, no skin discoloration, no cyanosis  NEURO: alert & oriented x 3 with fluent speech, no focal motor/sensory deficits, gait normal   LABORATORY DATA: I have reviewed the data detailed below Results for Alexander [name], Alexander [name] (MRN [id number]) as of 09/10/2015 11:02   Ref. Range 09/10/2015 11:25 09/10/2015 11:26  Sodium Latest Ref Range: 135-145 mmol/L  137  Potassium Latest Ref Range: 3.5-5.1 mmol/L  4.3  Chloride Latest Ref Range: 101-111 mmol/L  106  CO2 Latest Ref Range: 22-32 mmol/L  24  BUN Latest Ref Range: 6-20 mg/dL  25 (H)  Creatinine Latest Ref Range: 0.61-1.24 mg/dL  2.16 (H)  Calcium Latest Ref Range: 8.9-10.3 mg/dL  9.1  EGFR (Non-African Amer.) Latest Ref Range: >60 mL/min  30 (L)  EGFR (African American) Latest Ref Range: >60 mL/min  35 (L)  Glucose Latest Ref Range: 65-99 mg/dL  117 (H)  Anion gap Latest Ref Range: 5-15   7  Alkaline Phosphatase Latest Ref Range: 38-126 U/L  63  Albumin Latest Ref Range: 3.5-5.0 g/dL  4.2  AST Latest Ref Range: 15-41 U/L  22  ALT Latest Ref Range: 17-63 U/L  17  Total Protein Latest Ref Range: 6.5-8.1  g/dL  7.2  Total Bilirubin Latest Ref Range: 0.3-1.2 mg/dL  0.7  WBC Latest Ref Range: 4.0-10.5 K/uL  4.1  RBC Latest Ref Range: 4.22-5.81 MIL/uL  4.04 (L)  Hemoglobin Latest Ref Range: 13.0-17.0 g/dL  12.6 (L)  HCT Latest Ref Range: 39.0-52.0 %  36.9 (L)  MCV Latest Ref Range: 78.0-100.0 fL  91.3  MCH Latest Ref Range: 26.0-34.0 pg  31.2  MCHC Latest Ref Range: 30.0-36.0 g/dL  34.1  RDW Latest Ref Range: 11.5-15.5 %  14.6  Platelets Latest Ref Range: 150-400 K/uL  197  Neutrophils Latest Units: %  63  Lymphocytes Latest Units: %  22  Monocytes Relative Latest Units: %  12  Eosinophil Latest Units: %  3  Basophil Latest Units: %  0  NEUT# Latest Ref Range: 1.7-7.7 K/uL  2.6  Lymphocyte # Latest Ref Range: 0.7-4.0 K/uL  0.9  Monocyte # Latest Ref Range: 0.1-1.0 K/uL  0.5  Eosinophils Absolute Latest Ref Range: 0.0-0.7 K/uL  0.1  Basophils Absolute Latest Ref Range: 0.0-0.1 K/uL  0.0  TSH Latest Ref Range: 0.350-4.500 uIU/mL 2.251     ASSESSMENT AND PLAN:  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 TSH was checked today and WNL. Mild anemia persists and will continue to be monitored. He has lost some weight since his FT removal and new dentures but notes he is still adjusting to his dentures.   Alexander Craze, NP of survivorship has set him up for a colonoscopy. He missed his scheduled appointment with Dr. Benjamine Mola and plans on rescheduling. I reminded the patient how he initially neglected his head and neck cancer and the importance of caring for himself. He has certainly come a long way. We discussed screening colonoscopy and he noted that he planned on going to his scheduled appointment.   I have refilled his oxycodone 20 mg.  He will return for follow up in 4 months.  All questions were answered. The  patient knows to call the clinic with any problems, questions or concerns. We can certainly see the patient much sooner if necessary.   This document serves as a record of services personally performed by Ancil Linsey, MD. It was created on her behalf by Arlyce Harman, a trained medical scribe. The creation of this record is based on the scribe's personal observations and the provider's statements to them. This document has been checked and approved by the attending provider.  I have reviewed the above documentation for accuracy and completeness, and I agree with the above.   This note was electronically signed.  Kelby Fam. Whitney Muse, MD

## 2015-09-27 ENCOUNTER — Ambulatory Visit (INDEPENDENT_AMBULATORY_CARE_PROVIDER_SITE_OTHER): Payer: Medicare Other | Admitting: Otolaryngology

## 2015-09-27 DIAGNOSIS — Z85819 Personal history of malignant neoplasm of unspecified site of lip, oral cavity, and pharynx: Secondary | ICD-10-CM

## 2015-10-08 ENCOUNTER — Other Ambulatory Visit (HOSPITAL_COMMUNITY): Payer: Self-pay | Admitting: Oncology

## 2015-10-08 DIAGNOSIS — C01 Malignant neoplasm of base of tongue: Secondary | ICD-10-CM

## 2015-10-08 MED ORDER — OXYCODONE HCL 20 MG PO TABS: 20.0000 mg | ORAL_TABLET | Freq: Four times a day (QID) | ORAL | Status: DC | PRN

## 2015-10-09 ENCOUNTER — Encounter (HOSPITAL_COMMUNITY): Payer: Self-pay | Admitting: Hematology & Oncology

## 2015-11-03 ENCOUNTER — Encounter (HOSPITAL_COMMUNITY): Payer: Medicare Other | Attending: Oncology

## 2015-11-03 DIAGNOSIS — C01 Malignant neoplasm of base of tongue: Secondary | ICD-10-CM | POA: Insufficient documentation

## 2015-11-03 DIAGNOSIS — C109 Malignant neoplasm of oropharynx, unspecified: Secondary | ICD-10-CM | POA: Diagnosis not present

## 2015-11-03 DIAGNOSIS — Z452 Encounter for adjustment and management of vascular access device: Secondary | ICD-10-CM

## 2015-11-03 MED ORDER — SODIUM CHLORIDE 0.9% FLUSH
10.0000 mL | INTRAVENOUS | Status: DC | PRN
  Administered 2015-11-03: 10 mL via INTRAVENOUS
  Filled 2015-11-03: qty 10

## 2015-11-03 MED ORDER — HEPARIN SOD (PORK) LOCK FLUSH 100 UNIT/ML IV SOLN
500.0000 [IU] | Freq: Once | INTRAVENOUS | Status: AC
  Administered 2015-11-03: 500 [IU] via INTRAVENOUS
  Filled 2015-11-03: qty 5

## 2015-11-03 NOTE — Patient Instructions (Signed)
[place] at [facility] Discharge Instructions  RECOMMENDATIONS MADE BY THE CONSULTANT AND ANY TEST RESULTS WILL BE SENT TO YOUR REFERRING PHYSICIAN.  Port flushed today. Follow up as scheduled.   Thank you for choosing [place] at [facility] to provide your oncology and hematology care.  To afford each patient quality time with our provider, please arrive at least 15 minutes before your scheduled appointment time.   Beginning January 23rd 2017 lab work for the [facility] will be done in the  Main lab at [facility] on 1st floor. If you have a lab appointment with the [facility] please come in thru the  Main Entrance and check in at the main information desk  You need to re-schedule your appointment should you arrive 10 or more minutes late.  We strive to give you quality time with our providers, and arriving late affects you and other patients whose appointments are after yours.  Also, if you no show three or more times for appointments you may be dismissed from the clinic at the providers discretion.     Again, thank you for choosing [facility].  Our hope is that these requests will decrease the amount of time that you wait before being seen by our physicians.       _____________________________________________________________  Should you have questions after your visit to [facility] please contact our office at [phone] between the hours of 8:30 a.m. and 4:30 p.m.  Voicemails left after 4:30 p.m. will not be returned until the following business day.  For prescription refill requests, have your pharmacy contact our office.         Resources For Cancer Patients and their Caregivers ? American Cancer Society: Can assist with transportation, wigs, general needs, runs Look Good Feel Better.        773-470-2893 ? Cancer Care: Provides financial assistance, online support groups, medication/co-pay  assistance.  1-800-813-HOPE (671) 611-5753) ? [place] Assists [place] cancer patients and their families through emotional , educational and financial support.  302-719-0605 ? [place] DSS Where to apply for food stamps, Medicaid and utility assistance. [phone] ? RCATS: Transportation to medical appointments. 9717065525 ? Social Security Administration: May apply for disability if have a Stage IV cancer. 210-381-1733 [phone] ? [employer], Disability and Transit Services: Assists with nutrition, care and transit needs. [place] Support Programs: '@10RELATIVEDAYS'$ @ > Cancer Support Group  2nd Tuesday of the month 1pm-2pm, Journey Room  > Creative Journey  3rd Tuesday of the month 1130am-1pm, Journey Room  > Look Good Feel Better  1st Wednesday of the month 10am-12 noon, Journey Room (Call [place] to register [phone])

## 2015-11-03 NOTE — Progress Notes (Signed)
Alexander [name] presented for Portacath access and flush.  Portacath located right chest wall accessed with  H 20 needle. Good blood return present. Portacath flushed with 36m NS and 500U/571mHeparin and needle removed intact. Procedure without incident. Patient tolerated procedure well.

## 2015-11-05 ENCOUNTER — Encounter (HOSPITAL_COMMUNITY): Payer: Self-pay

## 2015-11-08 ENCOUNTER — Telehealth (HOSPITAL_COMMUNITY): Payer: Self-pay | Admitting: *Deleted

## 2015-11-08 ENCOUNTER — Other Ambulatory Visit (HOSPITAL_COMMUNITY): Payer: Self-pay | Admitting: Oncology

## 2015-11-08 DIAGNOSIS — C01 Malignant neoplasm of base of tongue: Secondary | ICD-10-CM

## 2015-11-08 DIAGNOSIS — B370 Candidal stomatitis: Secondary | ICD-10-CM

## 2015-11-08 MED ORDER — OXYCODONE HCL 20 MG PO TABS: 20.0000 mg | ORAL_TABLET | Freq: Four times a day (QID) | ORAL | 0 refills | Status: DC | PRN

## 2015-11-08 MED ORDER — FIRST-DUKES MOUTHWASH MT SUSP: 5.0000 mL | Freq: Four times a day (QID) | OROMUCOSAL | 1 refills | Status: DC | PRN

## 2015-12-10 ENCOUNTER — Telehealth (HOSPITAL_COMMUNITY): Payer: Self-pay

## 2015-12-10 DIAGNOSIS — C01 Malignant neoplasm of base of tongue: Secondary | ICD-10-CM

## 2015-12-10 MED ORDER — OXYCODONE HCL 20 MG PO TABS: 20.0000 mg | ORAL_TABLET | Freq: Four times a day (QID) | ORAL | 0 refills | Status: DC | PRN

## 2015-12-10 NOTE — Telephone Encounter (Signed)
-----   Message from Epifanio Lesches sent at 12/10/2015  2:32 PM EDT ----- Refill on oxycodone

## 2015-12-10 NOTE — Telephone Encounter (Signed)
Received refill request for oxycodone via in basket. Chart checked and refilled.

## 2016-01-10 ENCOUNTER — Other Ambulatory Visit (HOSPITAL_COMMUNITY): Payer: Self-pay

## 2016-01-10 ENCOUNTER — Telehealth (HOSPITAL_COMMUNITY): Payer: Self-pay | Admitting: *Deleted

## 2016-01-10 ENCOUNTER — Other Ambulatory Visit (HOSPITAL_COMMUNITY): Payer: Self-pay | Admitting: Oncology

## 2016-01-10 ENCOUNTER — Ambulatory Visit (HOSPITAL_COMMUNITY): Payer: Self-pay | Admitting: Hematology & Oncology

## 2016-01-10 DIAGNOSIS — C01 Malignant neoplasm of base of tongue: Secondary | ICD-10-CM

## 2016-01-10 DIAGNOSIS — B370 Candidal stomatitis: Secondary | ICD-10-CM

## 2016-01-10 MED ORDER — OXYCODONE HCL 20 MG PO TABS: 20.0000 mg | ORAL_TABLET | Freq: Four times a day (QID) | ORAL | 0 refills | Status: DC | PRN

## 2016-01-10 MED ORDER — FIRST-DUKES MOUTHWASH MT SUSP: 5.0000 mL | Freq: Four times a day (QID) | OROMUCOSAL | 1 refills | Status: DC | PRN

## 2016-01-10 NOTE — Telephone Encounter (Signed)
Rx printed  Doy Mince 01/10/2016 3:14 PM

## 2016-01-18 ENCOUNTER — Encounter (HOSPITAL_COMMUNITY): Payer: Self-pay

## 2016-01-18 ENCOUNTER — Encounter (HOSPITAL_COMMUNITY): Payer: Medicare Other | Attending: Oncology

## 2016-01-18 DIAGNOSIS — C01 Malignant neoplasm of base of tongue: Secondary | ICD-10-CM | POA: Insufficient documentation

## 2016-01-18 DIAGNOSIS — Z452 Encounter for adjustment and management of vascular access device: Secondary | ICD-10-CM

## 2016-01-18 DIAGNOSIS — C109 Malignant neoplasm of oropharynx, unspecified: Secondary | ICD-10-CM

## 2016-01-18 MED ORDER — HEPARIN SOD (PORK) LOCK FLUSH 100 UNIT/ML IV SOLN
500.0000 [IU] | Freq: Once | INTRAVENOUS | Status: AC
  Administered 2016-01-18: 500 [IU] via INTRAVENOUS

## 2016-01-18 MED ORDER — HEPARIN SOD (PORK) LOCK FLUSH 100 UNIT/ML IV SOLN
INTRAVENOUS | Status: AC
  Filled 2016-01-18: qty 5

## 2016-01-18 MED ORDER — SODIUM CHLORIDE 0.9% FLUSH
10.0000 mL | INTRAVENOUS | Status: DC | PRN
  Administered 2016-01-18: 10 mL via INTRAVENOUS
  Filled 2016-01-18: qty 10

## 2016-01-18 NOTE — Progress Notes (Signed)
Alexander [name] presented for Portacath access and flush. Portacath located right chest wall accessed with  H 20 needle. Good blood return present. Portacath flushed with 64m NS and 500U/519mHeparin and needle removed intact. Procedure without incident. Patient tolerated procedure well.

## 2016-01-18 NOTE — Patient Instructions (Signed)
[facility] at [facility] Discharge Instructions  RECOMMENDATIONS MADE BY THE CONSULTANT AND ANY TEST RESULTS WILL BE SENT TO YOUR REFERRING PHYSICIAN.  You had your port flushed today. Return as scheduled.    Thank you for choosing [facility] at [facility] to provide your oncology and hematology care.  To afford each patient quality time with our provider, please arrive at least 15 minutes before your scheduled appointment time.   Beginning January 23rd 2017 lab work for the Cancer Center will be done in the  Main lab at [facility] on 1st floor. If you have a lab appointment with the Cancer Center please come in thru the  Main Entrance and check in at the main information desk  You need to re-schedule your appointment should you arrive 10 or more minutes late.  We strive to give you quality time with our providers, and arriving late affects you and other patients whose appointments are after yours.  Also, if you no show three or more times for appointments you may be dismissed from the clinic at the providers discretion.     Again, thank you for choosing [facility].  Our hope is that these requests will decrease the amount of time that you wait before being seen by our physicians.       _____________________________________________________________  Should you have questions after your visit to [facility], please contact our office at [phone] between the hours of 8:30 a.m. and 4:30 p.m.  Voicemails left after 4:30 p.m. will not be returned until the following business day.  For prescription refill requests, have your pharmacy contact our office.         Resources For Cancer Patients and their Caregivers ? American Cancer Society: Can assist with transportation, wigs, general needs, runs Look Good Feel Better.        1-888-227-6333 ? Cancer Care: Provides financial assistance, online support groups,  medication/co-pay assistance.  1-800-813-HOPE (4673) ? [facility] Assists [place] cancer patients and their families through emotional , educational and financial support.  336-427-4357 ? [place] DSS Where to apply for food stamps, Medicaid and utility assistance. 336-342-1394 ? RCATS: Transportation to medical appointments. 336-347-2287 ? Social Security Administration: May apply for disability if have a Stage IV cancer. 336-342-7796 1-800-772-1213 ? [place] Aging, Disability and Transit Services: Assists with nutrition, care and transit needs. 336-349-2343  Cancer Center Support Programs: @10RELATIVEDAYS@ > Cancer Support Group  2nd Tuesday of the month 1pm-2pm, Journey Room  > Creative Journey  3rd Tuesday of the month 1130am-1pm, Journey Room  > Look Good Feel Better  1st Wednesday of the month 10am-12 noon, Journey Room (Call American Cancer Society to register 1-800-395-5775)   

## 2016-02-08 ENCOUNTER — Telehealth (HOSPITAL_COMMUNITY): Payer: Self-pay | Admitting: *Deleted

## 2016-02-09 ENCOUNTER — Other Ambulatory Visit (HOSPITAL_COMMUNITY): Payer: Self-pay | Admitting: Oncology

## 2016-02-09 DIAGNOSIS — C01 Malignant neoplasm of base of tongue: Secondary | ICD-10-CM

## 2016-02-09 MED ORDER — OXYCODONE HCL 20 MG PO TABS: 20.0000 mg | ORAL_TABLET | Freq: Four times a day (QID) | ORAL | 0 refills | Status: DC | PRN

## 2016-02-10 ENCOUNTER — Encounter (HOSPITAL_COMMUNITY): Payer: Medicare Other | Attending: Oncology | Admitting: Oncology

## 2016-02-10 ENCOUNTER — Encounter (HOSPITAL_COMMUNITY): Payer: Self-pay | Admitting: Oncology

## 2016-02-10 VITALS — BP 136/69 | HR 63 | Temp 97.7°F | Resp 16 | Ht 73.0 in | Wt 151.0 lb

## 2016-02-10 DIAGNOSIS — C01 Malignant neoplasm of base of tongue: Secondary | ICD-10-CM | POA: Insufficient documentation

## 2016-02-10 DIAGNOSIS — C109 Malignant neoplasm of oropharynx, unspecified: Secondary | ICD-10-CM | POA: Diagnosis not present

## 2016-02-10 NOTE — Assessment & Plan Note (Addendum)
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 S/P curative concomitant chemo/XRT with remission on PET imaging in October 2016.  Oncology history is updated.  Labs in 2 weeks with his next port flush: CBC diff, CMET, TSH.  I personally reviewed and went over laboratory results with the patient.  The results are noted within this dictation.  Labs in 4 months: CBC diff, CMET.  He wishes to maintain his port at this time and will therefore need port flushes every 6 weeks.  He notes that he has follow-up with ENT in near future (he reports).   He reports no further follow-up with Dr. Isidore Moos is scheduled.  He is calling GI on Tuesday, 02/15/2016, to set-up colonoscopy.  He refuses influenza immunization today.  Return in 4 months follow-up.

## 2016-02-10 NOTE — Patient Instructions (Addendum)
Alexander [name] at [facility] Discharge Instructions  RECOMMENDATIONS MADE BY THE CONSULTANT AND ANY TEST RESULTS WILL BE SENT TO YOUR REFERRING PHYSICIAN.  You were seen today by Kirby Crigler PA-C. Port flush with labs in 2 weeks. Port flushes every 6 weeks. Follow up in 4 months.    Thank you for choosing [place] at [facility] to provide your oncology and hematology care.  To afford each patient quality time with our provider, please arrive at least 15 minutes before your scheduled appointment time.   Beginning January 23rd 2017 lab work for the [facility] will be done in the  Main lab at [facility] on 1st floor. If you have a lab appointment with the [facility] please come in thru the  Main Entrance and check in at the main information desk  You need to re-schedule your appointment should you arrive 10 or more minutes late.  We strive to give you quality time with our providers, and arriving late affects you and other patients whose appointments are after yours.  Also, if you no show three or more times for appointments you may be dismissed from the clinic at the providers discretion.     Again, thank you for choosing [facility].  Our hope is that these requests will decrease the amount of time that you wait before being seen by our physicians.       _____________________________________________________________  Should you have questions after your visit to [facility], please contact our office at [phone] between the hours of 8:30 a.m. and 4:30 p.m.  Voicemails left after 4:30 p.m. will not be returned until the following business day.  For prescription refill requests, have your pharmacy contact our office.         Resources For Cancer Patients and their Caregivers ? American Cancer Society: Can assist with transportation, wigs, general needs, runs Look Good Feel Better.         541-602-7984 ? Cancer Care: Provides financial assistance, online support groups, medication/co-pay assistance.  1-800-813-HOPE 937-651-5045) ? [place] Assists [place] cancer patients and their families through emotional , educational and financial support.  801-575-9751 ? [place] DSS Where to apply for food stamps, Medicaid and utility assistance. 602-442-6271 ? RCATS: Transportation to medical appointments. 716-445-2135 ? Social Security Administration: May apply for disability if have a Stage IV cancer. 706-172-2345 [phone]) [phone] ? [employer], Disability and Transit Services: Assists with nutrition, care and transit needs. [place] Support Programs: '@10RELATIVEDAYS'$ @ > Cancer Support Group  2nd Tuesday of the month 1pm-2pm, Journey Room  > Creative Journey  3rd Tuesday of the month 1130am-1pm, Journey Room  > Look Good Feel Better  1st Wednesday of the month 10am-12 noon, Journey Room (Call [place] to register [phone])

## 2016-02-10 NOTE — Progress Notes (Signed)
Manon Hilding, MD [place] [address]  Oropharyngeal cancer [facility]) - Plan: CBC with Differential, Comprehensive metabolic panel, TSH, CBC with Differential, Comprehensive metabolic panel  CURRENT THERAPY: Surveillance per NCCN guidelines  INTERVAL HISTORY: Alexander [name] 65 y.o. 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     Oropharyngeal cancer ([place])   07/27/2014 Imaging    CT neck- Advanced stage oropharyngeal cancer with necrotic adenopathy accounting for the left neck swelling.      07/28/2014 Initial Diagnosis    Oropharyngeal cancer      08/03/2014 Imaging    CT CAP- L supraclavicular lymphadenopathy is not completely visualized. This is better seen on the previous neck CT from 07/27/2014. Otherwise, no evidence for metastatic disease in the chest, abdomen, or pelvis.      08/03/2014 Imaging    Bone scan- Uptake at adjacent anterior LEFT 6, 7, 8 ribs likely representing trauma/fractures. Questionable nonspecific increased tracer localization at the posterior RIGHT 8th and 9th ribs, the adjacent nature which raises a a question of trauma as well      08/06/2014 Pathology Results    Dr. Benjamine Mola- Oropharynx, biopsy, Left - INVASIVE SQUAMOUS CELL CARCINOMA.      08/12/2014 Procedure    Dr. Enrique Sack- 1. Multiple extraction of tooth numbers 6, 17, 22, 23, 24, 25, 26, and 27. 3 Quadrants of alveoloplasty      08/17/2014 Pathology Results    PORT and G-TUBE placed by Dr. Carlis Stable.      08/26/2014 PET scan    Large hypermetabolic mass in the left base of tongue. Activity extends across midline to the right base tongue. 2. Intensely hypermetabolic left cervical metastatic lymph nodes. Lymph nodes extend from the left level II position to the left supraclavi      09/01/2014 - 09/22/2014 Chemotherapy    Concurrent chemoradiation with Cisplatin 100 mg/m2 x 2 cycles with Neulasta support. Held cycle #3 d/t renal toxicity.         09/03/2014 - 10/23/2014 Radiation Therapy    Treated in [place], IMRT Isidore Moos).  Base of tongue and bilat neck. Total dose: 70 Gy in 35 fractions. (of note, he did miss several treatments requiring BID dosing towards the end of treatment).       01/25/2015 PET scan    Near complete resolution of metabolic activity at the base of tongue. Minimal residual activity is likely post treatment effect. 2. Complete resolution of metabolic activity above LEFT cervical lymph nodes. No evidence of residual metabolically active         He is doing well.  He reports ongoing discomfort with swallowing but this is much improved he report.  He notes ongoing follow-up with Dr. Benjamine Mola (ENT).  He wants to maintain his port at this time.    He reports ongoing xerostomia.  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  He is prepared that it may not improve.  He continues to work in Biomedical scientist.  Review of Systems  Constitutional: Negative for chills, fever and weight loss.  HENT: Negative.   Eyes: Negative.   Respiratory: Negative.  Negative for cough.   Cardiovascular: Negative.  Negative for chest pain.  Gastrointestinal: Negative.  Negative for constipation, diarrhea, nausea and vomiting.  Genitourinary: Negative.   Musculoskeletal: Negative.   Skin: Negative.   Neurological: Negative.   Endo/Heme/Allergies: Negative.   Psychiatric/Behavioral: Negative.     Past Medical History:  Diagnosis Date  .  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 Oropharyngeal cancer ([place]) 07/28/2014   dx. 3 weeks ago.- Dr. Oneal [facility] [place], [place].  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 ESOPHAGOGASTRODUODENOSCOPY (EGD) WITH PROPOFOL N/A 08/17/2014   Procedure: ESOPHAGOGASTRODUODENOSCOPY (EGD) WITH PROPOFOL (procedure #1);  Surgeon: Aviva Signs Md, MD;  Location: [facility];   Service: General;  Laterality: N/A;  . MULTIPLE EXTRACTIONS WITH ALVEOLOPLASTY N/A 08/12/2014   Procedure: Extraction of tooth #'s 6,17,22,23,24,25,26,27 with alveoloplasty;  Surgeon: Lenn Cal, DDS;  Location: [facility];  Service: Oral Surgery;  Laterality: N/A;  . PANENDOSCOPY N/A 08/06/2014   Procedure: PANENDOSCOPY WITH BIOPSY;  Surgeon: Leta Baptist, MD;  Location: [place];  Service: ENT;  Laterality: N/A;  . PEG PLACEMENT Left 08/17/14  . PEG PLACEMENT N/A 08/17/2014   Procedure: PERCUTANEOUS ENDOSCOPIC GASTROSTOMY (PEG) PLACEMENT (procedure #1);  Surgeon: Aviva Signs Md, MD;  Location: [facility];  Service: General;  Laterality: N/A;  . PORTACATH PLACEMENT Right 08/17/14  . PORTACATH PLACEMENT Right 08/17/2014   Procedure: INSERTION PORT-A-CATH (procedure #2);  Surgeon: Aviva Signs Md, MD;  Location: [facility];  Service: General;  Laterality: Right;    History reviewed. No pertinent family history.  Social History   Social History  . Marital status: Married    Spouse name: N/A  . Number of children: 5  . Years of education: N/A   Social History Main Topics  . Smoking status: Former Smoker    Packs/day: 0.50    Years: 30.00    Quit date: 07/22/2014  . Smokeless tobacco: Never Used     Comment: Quit smoking a week ago  . Alcohol use No     Comment: 1-2 beers on the weekend  . Drug use: No  . Sexual activity: Not Asked   Other Topics Concern  . None   Social History Narrative  . None     PHYSICAL EXAMINATION  ECOG PERFORMANCE STATUS: 0 - Asymptomatic  Vitals:   02/10/16 1039  BP: 136/69  Pulse: 63  Resp: 16  Temp: 97.7 F (36.5 C)    GENERAL:alert, well nourished, well developed, comfortable, cooperative, smiling and comfortable. SKIN: skin color, texture, turgor are normal, no rashes or significant lesions HEAD: Normocephalic, No masses, lesions, tenderness or abnormalities EYES: normal, EOMI, Conjunctiva are pink and non-injected EARS: External  ears normal OROPHARYNX:lips, buccal mucosa, and tongue normal and mucous membranes are moist  NECK: supple, trachea midline LYMPH:  no palpable lymphadenopathy BREAST:not examined LUNGS: clear to auscultation and percussion HEART: regular rate & rhythm, no murmurs and no gallops ABDOMEN:abdomen soft and normal bowel sounds BACK: Back symmetric, no curvature. EXTREMITIES:less then 2 second capillary refill, no joint deformities, effusion, or inflammation, no skin discoloration, no cyanosis  NEURO: alert & oriented x 3 with fluent speech, no focal motor/sensory deficits, gait normal   LABORATORY DATA: CBC    Component Value Date/Time   WBC 4.1 09/10/2015 1126   RBC 4.04 (L) 09/10/2015 1126   HGB 12.6 (L) 09/10/2015 1126   HCT 36.9 (L) 09/10/2015 1126   PLT 197 09/10/2015 1126   MCV 91.3 09/10/2015 1126   MCH 31.2 09/10/2015 1126   MCHC 34.1 09/10/2015 1126   RDW 14.6 09/10/2015 1126   LYMPHSABS 0.9 09/10/2015 1126   MONOABS 0.5 09/10/2015 1126   EOSABS 0.1  09/10/2015 1126   BASOSABS 0.0 09/10/2015 1126      Chemistry      Component Value Date/Time   NA 137 09/10/2015 1126   K 4.3 09/10/2015 1126   CL 106 09/10/2015 1126   CO2 24 09/10/2015 1126   BUN 25 (H) 09/10/2015 1126   CREATININE 2.16 (H) 09/10/2015 1126      Component Value Date/Time   CALCIUM 9.1 09/10/2015 1126   ALKPHOS 63 09/10/2015 1126   AST 22 09/10/2015 1126   ALT 17 09/10/2015 1126   BILITOT 0.7 09/10/2015 1126        PENDING LABS:   RADIOGRAPHIC STUDIES:  No results found.   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 S/P curative concomitant chemo/XRT with remission on PET imaging in October 2016.  Oncology history is updated.  Labs in 2 weeks with his next port flush: CBC diff, CMET, TSH.  I personally reviewed and went over laboratory results with the patient.  The results are noted within this  dictation.  Labs in 4 months: CBC diff, CMET.  He wishes to maintain his port at this time and will therefore need port flushes every 6 weeks.  He notes that he has follow-up with ENT in near future (he reports).   He reports no further follow-up with Dr. Isidore Moos is scheduled.  He is calling GI on Tuesday, 02/15/2016, to set-up colonoscopy.  He refuses influenza immunization today.  Return in 4 months follow-up.   ORDERS PLACED FOR THIS ENCOUNTER: Orders Placed This Encounter  Procedures  . CBC with Differential  . Comprehensive metabolic panel  . TSH  . CBC with Differential  . Comprehensive metabolic panel    MEDICATIONS PRESCRIBED THIS ENCOUNTER: No orders of the defined types were placed in this encounter.   THERAPY PLAN:  NCCN guidelines for surveillance of Head and Neck cancer recommends (2.2017):  A. H+P every 1-3 months for year 1  B. H+P every 2-6 months for year 2  C. H+P every 4-8 months for years 3-5  D. H+P every year for years greater than 5  E. Post-treatment baseline imaging of primary (and neck, if treated) recommended within 6 months of treatment (category 2B).   F. Chest imaging as clinically indicated for patients with smoking history.  G. Further re-imaging as indicated based on worrisome or equivocal signs/symptoms, smoking history, and areas inaccessible to clinical examination.  H. Routine annual imaging may be indicated in areas difficult to visualize on exam.  I. TSH every 6-12 months if neck irradiated.  J. Dental evaluation   1. Recommended for oral cavity and sites exposed to significant intraoral radiation treatment.  K. Consider EBV DNA monitoring for nasopharyngeal cancer (Category 2B).  L. Supportive care and rehabilitation   1. Speech/hearing and swallowing evaluation and rehabilitation as clinically indicated.   2. Nutritional evaluation and rehabilitation as clinically indicated until nutritional status is stabilized.   3. Ongoing  surveillance for depression   4. Smoking cessation and alcohol counseling as clinically indicated.  M. For response assessment immediately after chemoradiation or RT (see FOLL-A 2 of 2).  N. Integration of survivorship care and care plan within 1 year, complementary to ongoing involvement from a head and neck oncologist.   All questions were answered. The patient knows to call the clinic with any problems, questions or concerns. We can certainly see the patient much sooner if necessary.  Patient and plan discussed with  Dr. Ancil Linsey and she is in agreement with the aforementioned.   This note is electronically signed by: Doy Mince 02/10/2016 4:43 PM

## 2016-02-22 ENCOUNTER — Telehealth: Payer: Self-pay

## 2016-02-22 NOTE — Telephone Encounter (Signed)
Pt received a letter from DS to set up colonoscopy. Please call patient at [phone]

## 2016-02-23 ENCOUNTER — Telehealth: Payer: Self-pay

## 2016-02-23 NOTE — Telephone Encounter (Signed)
Pt called again this morning asking to be set up for his colonoscopy. He had called yesterday. I told him there was a note for the triage nurse to call him and she does her triages between 130-430. He can be reached at [phone]

## 2016-02-23 NOTE — Telephone Encounter (Signed)
See separate triage.  

## 2016-02-24 ENCOUNTER — Other Ambulatory Visit: Payer: Self-pay

## 2016-02-24 DIAGNOSIS — Z1211 Encounter for screening for malignant neoplasm of colon: Secondary | ICD-10-CM

## 2016-02-24 NOTE — Telephone Encounter (Signed)
Gastroenterology Pre-Procedure Review  Request Date:02/23/2016 Requesting Physician: Holley Bouche NP  PATIENT REVIEW QUESTIONS: The patient responded to the following health history questions as indicated:    1. Diabetes Melitis: no 2. Joint replacements in the past 12 months: no 3. Major health problems in the past 3 months: no 4. Has an artificial valve or MVP: no 5. Has a defibrillator: no 6. Has been advised in past to take antibiotics in advance of a procedure like teeth cleaning: no 7. Family history of colon cancer: no  8. Alcohol Use: no 9. History of sleep apnea: no  10. History of coronary artery or other vascular stents placed within the last 12 months: no    MEDICATIONS & ALLERGIES:    Patient reports the following regarding taking any blood thinners:   Plavix? no Aspirin? no Coumadin? no Brilinta? no Xarelto? no Eliquis? no Pradaxa? no Savaysa? no Effient? no  Patient confirms/reports the following medications:  Current Outpatient Prescriptions  Medication Sig Dispense Refill  . Diphenhyd-Hydrocort-Nystatin (FIRST-DUKES MOUTHWASH) SUSP Use as directed 5 mLs in the mouth or throat 4 (four) times daily as needed. 300 mL 1  . Oxycodone HCl 20 MG TABS Take 1 tablet (20 mg total) by mouth 4 (four) times daily as needed (throat pain). 30 tablet 0   No current facility-administered medications for this visit.     Patient confirms/reports the following allergies:  No Known Allergies  No orders of the defined types were placed in this encounter.   AUTHORIZATION INFORMATION Primary Insurance:  ID #:  Group #:  Pre-Cert / Auth required:  Pre-Cert / Auth #:   Secondary Insurance:  ID #:  Group #:  Pre-Cert / Auth required:  Pre-Cert / Auth #:   SCHEDULE INFORMATION: Procedure has been scheduled as follows:  Date:  03/13/2016            Time: 2:15 PM Location: [facility] Short Stay  This Gastroenterology Pre-Precedure Review Form is being  routed to the following provider(s): Barney Drain, MD

## 2016-02-25 ENCOUNTER — Other Ambulatory Visit (HOSPITAL_COMMUNITY): Payer: Self-pay

## 2016-03-02 NOTE — Telephone Encounter (Signed)
FOLLOW A SOFT MECHNICAL DIET 3 DAYS PRIOR TO COLONOSCOPY. FULL LIQUID DIET TWO DAYS PRIOR TO TCS. CLEAR LIQUID DIET ON DAY BEFORE TCS. MOVIPREP.    SOFT MECHANICAL DIET This SOFT MECHANICAL DIET is restricted to:  Foods that are moist, soft-textured, and easy to chew and swallow.   Meats that are ground or are minced no larger than one-quarter inch pieces. Meats are moist with gravy or sauce added.   Foods that do not include bread or bread-like textures except soft pancakes, well-moistened with syrup or sauce.   Textures with some chewing ability required.   Casseroles without rice.   Cooked vegetables that are less than half an inch in size and easily mashed with a fork. No cooked corn, peas, broccoli, cauliflower, cabbage, Brussels sprouts, asparagus, or other fibrous, non-tender or rubbery cooked vegetables.   Canned fruit except for pineapple. Fruit must be cut into pieces no larger than half an inch in size.   Foods that do not include nuts, seeds, coconut, or sticky textures.   FOOD TEXTURES FOR DYSPHAGIA DIET LEVEL 2 -SOFT MECHANICAL DIET (includes all foods on Dysphagia Diet Level 1 - Pureed, in addition to the foods listed below)  FOOD GROUP: Breads. RECOMMENDED: Soft pancakes, well-moistened with syrup or sauce.  AVOID: All others.  FOOD GROUP: Cereals.  RECOMMENDED: Cooked cereals with little texture, including oatmeal. Unprocessed wheat bran stirred into cereals for bulk. Note: If thin liquids are restricted, it is important that all of the liquid is absorbed into the cereal.  AVOID: All dry cereals and any cooked cereals that may contain flax seeds or other seeds or nuts. Whole-grain, dry, or coarse cereals. Cereals with nuts, seeds, dried fruit, and/or coconut.  FOOD GROUP: Desserts. RECOMMENDED: Pudding, custard. Soft fruit pies with bottom crust only. Canned fruit (excluding pineapple). Soft, moist cakes with icing.Frozen malts, milk shakes, frozen yogurt,  eggnog, nutritional supplements, ice cream, sherbet, regular or sugar-free gelatin, or any foods that become thin liquid at either room (70 F) or body temperature (98 F).  AVOID: Dry, coarse cakes and cookies. Anything with nuts, seeds, coconut, pineapple, or dried fruit. Breakfast yogurt with nuts. Rice or bread pudding.  FOOD GROUP: Fats. RECOMMENDED: Butter, margarine, cream for cereal (depending on liquid consistency recommendations), gravy, cream sauces, sour cream, sour cream dips with soft additives, mayonnaise, salad dressings, cream cheese, cream cheese spreads with soft additives, whipped toppings.  AVOID: All fats with coarse or chunky additives.  FOOD GROUP: Fruits. RECOMMENDED: Soft drained, canned, or cooked fruits without seeds or skin. Fresh soft and ripe banana. Fruit juices with a small amount of pulp. If thin liquids are restricted, fruit juices should be thickened to appropriate consistency.  AVOID: Fresh or frozen fruits. Cooked fruit with skin or seeds. Dried fruits. Fresh, canned, or cooked pineapple.  FOOD GROUP: Meats and Meat Substitutes. (Meat pieces should not exceed 1/4 of an inch cube and should be tender.) RECOMMENDED: Moistened ground or cooked meat, poultry, or fish. Moist ground or tender meat may be served with gravy or sauce. Casseroles without rice. Moist macaroni and cheese, well-cooked pasta with meat sauce, tuna noodle casserole, soft, moist lasagna. Moist meatballs, meatloaf, or fish loaf. Protein salads, such as tuna or egg without large chunks, celery, or onion. Cottage cheese, smooth quiche without large chunks. Poached, scrambled, or soft-cooked eggs (egg yolks should not be "runny" but should be moist and able to be mashed with butter, margarine, or other moisture added to them). (Cook eggs to  160 F or use pasteurized eggs for safety.) Souffls may have small, soft chunks. Tofu. Well-cooked, slightly mashed, moist legumes, such as baked beans. All  meats or protein substitutes should be served with sauces or moistened to help maintain cohesiveness in the oral cavity.  AVOID: Dry meats, tough meats (such as bacon, sausage, hot dogs, bratwurst). Dry casseroles or casseroles with rice or large chunks. Peanut butter. Cheese slices and cubes. Hard-cooked or crisp fried eggs. Sandwiches.Pizza.  FOOD GROUP: Potatoes and Starches. RECOMMENDED: Well-cooked, moistened, boiled, baked, or mashed potatoes. Well-cooked shredded hash brown potatoes that are not crisp. (All potatoes need to be moist and in sauces.)Well-cooked noodles in sauce. Spaetzel or soft dumplings that have been moistened with butter or gravy.  AVOID: Potato skins and chips. Fried or French-fried potatoes. Rice.  FOOD GROUP: Soups. RECOMMENDED: Soups with easy-to-chew or easy-to-swallow meats or vegetables: Particle sizes in soups should be less than 1/2 inch. Soups will need to be thickened to appropriate consistency if soup is thinner than prescribed liquid consistency.  AVOID: Soups with large chunks of meat and vegetables. Soups with rice, corn, peas.  FOOD GROUP: Vegetables. RECOMMENDED: All soft, well-cooked vegetables. Vegetables should be less than a half inch. Should be easily mashed with a fork.  AVOID: Cooked corn and peas. Broccoli, cabbage, Brussels sprouts, asparagus, or other fibrous, non-tender or rubbery cooked vegetables.  FOOD GROUP: Miscellaneous. RECOMMENDED: Jams and preserves without seeds, jelly. Sauces, salsas, etc., that may have small tender chunks less than 1/2 inch. Soft, smooth chocolate bars that are easily chewed.  AVOID: Seeds, nuts, coconut, or sticky foods. Chewy candies such as caramels or licorice.  Full Liquid Diet A high-calorie, high-protein supplement should be used to meet your nutritional requirements when the full liquid diet is continued for more than 2 or 3 days. If this diet is to be used for an extended period of time (more than 7  days), a multivitamin should be considered.  Breads and Starches  Allowed: None are allowed   Avoid: Any others.    Potatoes/Pasta/Rice  Allowed: ANY ITEM AS A SOUP OR SMALL PLATE OF MASHED POTATOES OR SCRAMBLED EGGS. (DO NOT EAT MORE THAN ONE SERVING BEFORE COLONOSCOPY).      Vegetables  Allowed: Strained tomato or vegetable juice. Vegetables pureed in soup.   Avoid: Any others.    Fruit  Allowed: Any strained fruit juices and fruit drinks. Include 1 serving of citrus or vitamin C-enriched fruit juice daily.   Avoid: Any others.  Meat and Meat Substitutes  Allowed: Egg  Avoid: Any meat, fish, or fowl. All cheese.  Milk  Allowed: SOY Milk beverages, including milk shakes and instant breakfast mixes. Smooth yogurt.   Avoid: Any others. Avoid dairy products if not tolerated.    Soups and Combination Foods  Allowed: Broth, strained cream soups. Strained, broth-based soups.   Avoid: Any others.    Desserts and Sweets  Allowed: flavored gelatin, tapioca, ice cream, sherbet, smooth pudding, junket, fruit ices, frozen ice pops, pudding pops, frozen fudge pops, chocolate syrup. Sugar, honey, jelly, syrup.   Avoid: Any others.  Fats and Oils  Allowed: Margarine, butter, cream, sour cream, oils.   Avoid: Any others.  Beverages  Allowed: All.   Avoid: None.  Condiments  Allowed: Iodized salt, pepper, spices, flavorings. Cocoa powder.   Avoid: Any others.    SAMPLE MEAL PLAN Breakfast   cup orange juice.   1 OR 2 EGGS  1 cup milk.   1 cup  beverage (coffee or tea).   Cream or sugar, if desired.    Midmorning Snack  2 SCRAMBLED OR HARD BOILED EGG   Lunch  1 cup cream soup.    cup fruit juice.   1 cup milk.    cup custard.   1 cup beverage (coffee or tea).   Cream or sugar, if desired.    Midafternoon Snack  1 cup milk shake.  Dinner  1 cup cream soup.    cup fruit juice.   1 cup MILK    cup pudding.   1 cup  beverage (coffee or tea).   Cream or sugar, if desired.  Evening Snack  1 cup supplement.  To increase calories, add sugar, cream, butter, or margarine if possible. Nutritional supplements will also increase the total calories.

## 2016-03-03 MED ORDER — PEG-KCL-NACL-NASULF-NA ASC-C 100 G PO SOLR: 1.0000 | ORAL | 0 refills | Status: DC

## 2016-03-03 NOTE — Telephone Encounter (Signed)
Rx sent to the pharmacy and instructions mailed to pt.  

## 2016-03-03 NOTE — Telephone Encounter (Signed)
Movie prep not covered by insurance. I am giving the pt a sample. I called and LMOM to call me on Mon and come by and pick up sample and instructions.

## 2016-03-06 NOTE — Telephone Encounter (Signed)
Pt is aware to come by the office to pick up the Movie prep sample and his instructions.

## 2016-03-09 ENCOUNTER — Other Ambulatory Visit (HOSPITAL_COMMUNITY): Payer: Self-pay | Admitting: Oncology

## 2016-03-09 ENCOUNTER — Telehealth: Payer: Self-pay

## 2016-03-09 DIAGNOSIS — C01 Malignant neoplasm of base of tongue: Secondary | ICD-10-CM

## 2016-03-09 MED ORDER — OXYCODONE HCL 20 MG PO TABS: 20.0000 mg | ORAL_TABLET | Freq: Four times a day (QID) | ORAL | 0 refills | Status: DC | PRN

## 2016-03-09 NOTE — Telephone Encounter (Signed)
I called [employer] @ [phone] and spoke to Schuyler E who said a PA is not required for the screening colonoscopy. No reference number required.

## 2016-03-10 ENCOUNTER — Encounter (HOSPITAL_COMMUNITY): Payer: Self-pay | Admitting: *Deleted

## 2016-03-10 ENCOUNTER — Other Ambulatory Visit (HOSPITAL_COMMUNITY): Payer: Self-pay | Admitting: Oncology

## 2016-03-10 DIAGNOSIS — C01 Malignant neoplasm of base of tongue: Secondary | ICD-10-CM

## 2016-03-10 MED ORDER — OXYCODONE HCL 20 MG PO TABS: 20.0000 mg | ORAL_TABLET | Freq: Four times a day (QID) | ORAL | 0 refills | Status: DC | PRN

## 2016-03-13 ENCOUNTER — Encounter (HOSPITAL_COMMUNITY)
Admission: RE | Disposition: A | Discharge status: Home / Self Care | Payer: Self-pay | Source: Ambulatory Visit | Attending: Gastroenterology

## 2016-03-13 ENCOUNTER — Encounter (HOSPITAL_COMMUNITY): Payer: Self-pay

## 2016-03-13 ENCOUNTER — Ambulatory Visit (HOSPITAL_COMMUNITY)
Admission: RE | Admit: 2016-03-13 | Discharge: 2016-03-13 | Disposition: A | Discharge status: Home / Self Care | Payer: Medicare Other | Source: Ambulatory Visit | Attending: Gastroenterology | Admitting: Gastroenterology

## 2016-03-13 DIAGNOSIS — Z85818 Personal history of malignant neoplasm of other sites of lip, oral cavity, and pharynx: Secondary | ICD-10-CM | POA: Insufficient documentation

## 2016-03-13 DIAGNOSIS — Z1211 Encounter for screening for malignant neoplasm of colon: Secondary | ICD-10-CM | POA: Diagnosis not present

## 2016-03-13 DIAGNOSIS — K648 Other hemorrhoids: Secondary | ICD-10-CM | POA: Diagnosis not present

## 2016-03-13 DIAGNOSIS — Z1212 Encounter for screening for malignant neoplasm of rectum: Secondary | ICD-10-CM | POA: Diagnosis not present

## 2016-03-13 DIAGNOSIS — Z85810 Personal history of malignant neoplasm of tongue: Secondary | ICD-10-CM | POA: Insufficient documentation

## 2016-03-13 DIAGNOSIS — Z87891 Personal history of nicotine dependence: Secondary | ICD-10-CM | POA: Insufficient documentation

## 2016-03-13 DIAGNOSIS — Q438 Other specified congenital malformations of intestine: Secondary | ICD-10-CM | POA: Insufficient documentation

## 2016-03-13 HISTORY — PX: COLONOSCOPY: SHX5424

## 2016-03-13 SURGERY — COLONOSCOPY
Anesthesia: Moderate Sedation

## 2016-03-13 MED ORDER — MEPERIDINE HCL 100 MG/ML IJ SOLN
INTRAMUSCULAR | Status: DC | PRN
  Administered 2016-03-13 (×3): 25 mg via INTRAVENOUS

## 2016-03-13 MED ORDER — MIDAZOLAM HCL 5 MG/5ML IJ SOLN
INTRAMUSCULAR | Status: AC
  Filled 2016-03-13: qty 10

## 2016-03-13 MED ORDER — MIDAZOLAM HCL 5 MG/5ML IJ SOLN
INTRAMUSCULAR | Status: DC | PRN
  Administered 2016-03-13: 2 mg via INTRAVENOUS
  Administered 2016-03-13: 1 mg via INTRAVENOUS
  Administered 2016-03-13: 2 mg via INTRAVENOUS

## 2016-03-13 MED ORDER — MEPERIDINE HCL 100 MG/ML IJ SOLN
INTRAMUSCULAR | Status: AC
  Filled 2016-03-13: qty 2

## 2016-03-13 MED ORDER — SODIUM CHLORIDE 0.9 % IV SOLN
INTRAVENOUS | Status: DC
  Administered 2016-03-13: 1000 mL via INTRAVENOUS

## 2016-03-13 MED ORDER — STERILE WATER FOR IRRIGATION IR SOLN
Status: DC | PRN
  Administered 2016-03-13: 2.5 mL

## 2016-03-13 NOTE — Op Note (Signed)
[facility] Patient Name: Alexander [name] Procedure Date: 03/13/2016 11:36 AM MRN: [id number] Date of Birth: August 26, 1950 Attending MD: Barney Drain , MD CSN: [id number] Age: 65 Admit Type: Outpatient Procedure:                Colonoscopy, SCREENING Indications:              Screening for colorectal malignant neoplasm Providers:                Barney Drain, MD, Lurline Del, RN, Isabella Stalling,                            Technician Referring MD:             Ancil Linsey MD Medicines:                Meperidine 75 mg IV, Midazolam 5 mg IV Complications:            No immediate complications. Estimated Blood Loss:     Estimated blood loss: none. Procedure:                Pre-Anesthesia Assessment:                           - Prior to the procedure, a History and Physical                            was performed, and patient medications and                            allergies were reviewed. The patient's tolerance of                            previous anesthesia was also reviewed. The risks                            and benefits of the procedure and the sedation                            options and risks were discussed with the patient.                            All questions were answered, and informed consent                            was obtained. Prior Anticoagulants: The patient has                            taken no previous anticoagulant or antiplatelet                            agents. ASA Grade Assessment: I - A normal, healthy                            patient. After reviewing the risks and benefits,  the patient was deemed in satisfactory condition to                            undergo the procedure. After obtaining informed                            consent, the colonoscope was passed under direct                            vision. Throughout the procedure, the patient's                            blood pressure, pulse, and oxygen  saturations were                            monitored continuously. The EC-[id number] ([id number])                            scope was introduced through the anus and advanced                            to the the cecum, identified by appendiceal orifice                            and ileocecal valve. The colonoscopy was somewhat                            difficult due to a tortuous colon. Successful                            completion of the procedure was aided by COLOWRAP.                            The patient tolerated the procedure well. The                            quality of the bowel preparation was good. The                            ileocecal valve, appendiceal orifice, and rectum                            were photographed. Scope In: 12:14:41 PM Scope Out: 12:34:44 PM Scope Withdrawal Time: 0 hours 14 minutes 48 seconds  Total Procedure Duration: 0 hours 20 minutes 3 seconds  Findings:      The digital rectal exam was normal.      The recto-sigmoid colon and sigmoid colon were moderately redundant.      Internal hemorrhoids were found. The hemorrhoids were small.      The exam was otherwise without abnormality. Impression:               - Redundant LEFT colon.                           -  SMALL Internal hemorrhoids. Moderate Sedation:      Moderate (conscious) sedation was administered by the endoscopy nurse       and supervised by the endoscopist. The following parameters were       monitored: oxygen saturation, heart rate, blood pressure, and response       to care. Total physician intraservice time was 36 minutes. Recommendation:           - High fiber diet.                           - Continue present medications.                           - Repeat colonoscopy in 10 years for surveillance.                           - Patient has a contact number available for                            emergencies. The signs and symptoms of potential                            delayed  complications were discussed with the                            patient. Return to normal activities tomorrow.                            Written discharge instructions were provided to the                            patient. Procedure Code(s):        --- Professional ---                           [phone], Colonoscopy, flexible; diagnostic, including                            collection of specimen(s) by brushing or washing,                            when performed (separate procedure)                           99152, Moderate sedation services provided by the                            same physician or other qualified health care                            professional performing the diagnostic or                            therapeutic service that the sedation supports,  requiring the presence of an independent trained                            observer to assist in the monitoring of the                            patient's level of consciousness and physiological                            status; initial 15 minutes of intraservice time,                            patient age 60 years or older                           [phone], Moderate sedation services; each additional                            15 minutes intraservice time Diagnosis Code(s):        --- Professional ---                           Z12.11, Encounter for screening for malignant                            neoplasm of colon                           K64.8, Other hemorrhoids                           Q43.8, Other specified congenital malformations of                            intestine CPT copyright 2016 [employer]. All rights reserved. The codes documented in this report are preliminary and upon coder review may  be revised to meet current compliance requirements. Barney Drain, MD Barney Drain, MD 03/13/2016 12:50:21 PM This report has been signed electronically. Number of  Addenda: 0

## 2016-03-13 NOTE — Discharge Instructions (Signed)
YOU DID NOT HAVE ANY POLYPS. You have SMALL internal AND moderate EXTERNAL hemorrhoids.    DRINK WATER TO KEEP YOUR URINE LIGHT YELLOW.  FOLLOW A HIGH FIBER DIET. AVOID ITEMS THAT CAUSE BLOATING & GAS. SEE INFO BELOW.  USE PREPARATION H FOUR TIMES  A DAY IF NEEDED TO RELIEVE hemorrhoid PAIN/PRESSURE/BLEEDING.  Next colonoscopy in 10 years.   Colonoscopy Care After Read the instructions outlined below and refer to this sheet in the next week. These discharge instructions provide you with general information on caring for yourself after you leave the hospital. While your treatment has been planned according to the most current medical practices available, unavoidable complications occasionally occur. If you have any problems or questions after discharge, call DR. Rmani Kapusta, [phone].  ACTIVITY  You may resume your regular activity, but move at a slower pace for the next 24 hours.   Take frequent rest periods for the next 24 hours.   Walking will help get rid of the air and reduce the bloated feeling in your belly (abdomen).   No driving for 24 hours (because of the medicine (anesthesia) used during the test).   You may shower.   Do not sign any important legal documents or operate any machinery for 24 hours (because of the anesthesia used during the test).    NUTRITION  Drink plenty of fluids.   You may resume your normal diet as instructed by your doctor.   Begin with a light meal and progress to your normal diet. Heavy or fried foods are harder to digest and may make you feel sick to your stomach (nauseated).   Avoid alcoholic beverages for 24 hours or as instructed.    MEDICATIONS  You may resume your normal medications.   WHAT YOU CAN EXPECT TODAY  Some feelings of bloating in the abdomen.   Passage of more gas than usual.   Spotting of blood in your stool or on the toilet paper  .  IF YOU HAD POLYPS REMOVED DURING THE COLONOSCOPY:  Eat a soft diet IF YOU  HAVE NAUSEA, BLOATING, ABDOMINAL PAIN, OR VOMITING.    FINDING OUT THE RESULTS OF YOUR TEST Not all test results are available during your visit. DR. Oneida Alar WILL CALL YOU WITHIN 14 DAYS OF YOUR PROCEDUE WITH YOUR RESULTS. Do not assume everything is normal if you have not heard from DR. Latravion Lowrimore, CALL HER OFFICE AT [phone].  SEEK IMMEDIATE MEDICAL ATTENTION AND CALL THE OFFICE: [phone] IF:  You have more than a spotting of blood in your stool.   Your belly is swollen (abdominal distention).   You are nauseated or vomiting.   You have a temperature over 101F.   You have abdominal pain or discomfort that is severe or gets worse throughout the day.   High-Fiber Diet A high-fiber diet changes your normal diet to include more whole grains, legumes, fruits, and vegetables. Changes in the diet involve replacing refined carbohydrates with unrefined foods. The calorie level of the diet is essentially unchanged. The Dietary Reference Intake (recommended amount) for adult males is 38 grams per day. For adult females, it is 25 grams per day. Pregnant and lactating women should consume 28 grams of fiber per day. Fiber is the intact part of a plant that is not broken down during digestion. Functional fiber is fiber that has been isolated from the plant to provide a beneficial effect in the body. PURPOSE  Increase stool bulk.   Ease and regulate bowel movements.   Lower  cholesterol.   REDUCE RISK OF COLON CANCER  INDICATIONS THAT YOU NEED MORE FIBER  Constipation and hemorrhoids.   Uncomplicated diverticulosis (intestine condition) and irritable bowel syndrome.   Weight management.   As a protective measure against hardening of the arteries (atherosclerosis), diabetes, and cancer.   GUIDELINES FOR INCREASING FIBER IN THE DIET  Start adding fiber to the diet slowly. A gradual increase of about 5 more grams (2 slices of whole-wheat bread, 2 servings of most fruits or vegetables, or  1 bowl of high-fiber cereal) per day is best. Too rapid an increase in fiber may result in constipation, flatulence, and bloating.   Drink enough water and fluids to keep your urine clear or pale yellow. Water, juice, or caffeine-free drinks are recommended. Not drinking enough fluid may cause constipation.   Eat a variety of high-fiber foods rather than one type of fiber.   Try to increase your intake of fiber through using high-fiber foods rather than fiber pills or supplements that contain small amounts of fiber.   The goal is to change the types of food eaten. Do not supplement your present diet with high-fiber foods, but replace foods in your present diet.   INCLUDE A VARIETY OF FIBER SOURCES  Replace refined and processed grains with whole grains, canned fruits with fresh fruits, and incorporate other fiber sources. White rice, white breads, and most bakery goods contain little or no fiber.   Brown whole-grain rice, buckwheat oats, and many fruits and vegetables are all good sources of fiber. These include: broccoli, Brussels sprouts, cabbage, cauliflower, beets, sweet potatoes, white potatoes (skin on), carrots, tomatoes, eggplant, squash, berries, fresh fruits, and dried fruits.   Cereals appear to be the richest source of fiber. Cereal fiber is found in whole grains and bran. Bran is the fiber-rich outer coat of cereal grain, which is largely removed in refining. In whole-grain cereals, the bran remains. In breakfast cereals, the largest amount of fiber is found in those with "bran" in their names. The fiber content is sometimes indicated on the label.   You may need to include additional fruits and vegetables each day.   In baking, for 1 cup white flour, you may use the following substitutions:   1 cup whole-wheat flour minus 2 tablespoons.   1/2 cup white flour plus 1/2 cup whole-wheat flour.   Polyps, Colon  A polyp is extra tissue that grows inside your body. Colon polyps grow  in the large intestine. The large intestine, also called the colon, is part of your digestive system. It is a long, hollow tube at the end of your digestive tract where your body makes and stores stool. Most polyps are not dangerous. They are benign. This means they are not cancerous. But over time, some types of polyps can turn into cancer. Polyps that are smaller than a pea are usually not harmful. But larger polyps could someday become or may already be cancerous. To be safe, doctors remove all polyps and test them.   PREVENTION There is not one sure way to prevent polyps. You might be able to lower your risk of getting them if you:  Eat more fruits and vegetables and less fatty food.   Do not smoke.   Avoid alcohol.   Exercise every day.   Lose weight if you are overweight.   Eating more calcium and folate can also lower your risk of getting polyps. Some foods that are rich in calcium are milk, cheese, and broccoli. Some  foods that are rich in folate are chickpeas, kidney beans, and spinach.   Hemorrhoids Hemorrhoids are dilated (enlarged) veins around the rectum. Sometimes clots will form in the veins. This makes them swollen and painful. These are called thrombosed hemorrhoids. Causes of hemorrhoids include:  Constipation.   Straining to have a bowel movement.   HEAVY LIFTING  HOME CARE INSTRUCTIONS  Eat a well balanced diet and drink 6 to 8 glasses of water every day to avoid constipation. You may also use a bulk laxative.   Avoid straining to have bowel movements.   Keep anal area dry and clean.   Do not use a donut shaped pillow or sit on the toilet for long periods. This increases blood pooling and pain.   Move your bowels when your body has the urge; this will require less straining and will decrease pain and pressure.

## 2016-03-13 NOTE — H&P (Signed)
Primary Care Physician:  Manon Hilding, MD Primary Gastroenterologist:  Dr. Oneida Alar  Pre-Procedure History & Physical: HPI:  Alexander [name] is a 65 y.o. male here for COLON CANCER SCREENING.  Past Medical History:  Diagnosis Date  . 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 3 weeks ago.- Dr. Oneal [facility] [place], [place].  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 ESOPHAGOGASTRODUODENOSCOPY (EGD) WITH PROPOFOL N/A 08/17/2014   Procedure: ESOPHAGOGASTRODUODENOSCOPY (EGD) WITH PROPOFOL (procedure #1);  Surgeon: Aviva Signs Md, MD;  Location: [facility];  Service: General;  Laterality: N/A;  . MULTIPLE EXTRACTIONS WITH ALVEOLOPLASTY N/A 08/12/2014   Procedure: Extraction of tooth #'s 6,17,22,23,24,25,26,27 with alveoloplasty;  Surgeon: Lenn Cal, DDS;  Location: [facility];  Service: Oral Surgery;  Laterality: N/A;  . PANENDOSCOPY N/A 08/06/2014   Procedure: PANENDOSCOPY WITH BIOPSY;  Surgeon: Leta Baptist, MD;  Location: [facility];  Service: ENT;  Laterality: N/A;  . PEG PLACEMENT Left 08/17/14  . PEG PLACEMENT N/A 08/17/2014   Procedure: PERCUTANEOUS ENDOSCOPIC GASTROSTOMY (PEG) PLACEMENT (procedure #1);  Surgeon: Aviva Signs Md, MD;  Location: [facility];  Service: General;  Laterality: N/A;  . PORTACATH PLACEMENT Right 08/17/14  . PORTACATH PLACEMENT Right 08/17/2014   Procedure: INSERTION PORT-A-CATH (procedure #2);  Surgeon: Aviva Signs Md, MD;  Location: [facility];  Service: General;  Laterality: Right;    Prior to Admission medications   Medication Sig Start Date End Date Taking? Authorizing Provider  Oxycodone HCl 20 MG TABS Take 1 tablet (20 mg total) by mouth 4 (four) times daily as needed (throat pain). 03/10/16  Yes Baird Cancer, PA-C    Allergies as of 02/24/2016  . (No Known  Allergies)    History reviewed. No pertinent family history.  Social History   Social History  . Marital status: Married    Spouse name: N/A  . Number of children: 5  . Years of education: N/A   Occupational History  . Not on file.   Social History Main Topics  . Smoking status: Former Smoker    Packs/day: 0.50    Years: 30.00    Quit date: 07/22/2014  . Smokeless tobacco: Never Used     Comment: Quit smoking a week ago  . Alcohol use No     Comment: 1-2 beers on the weekend  . Drug use: No  . Sexual activity: Not on file   Other Topics Concern  . Not on file   Social History Narrative  . No narrative on file    Review of Systems: See HPI, otherwise negative ROS   Physical Exam: BP (!) 143/77   Pulse (!) 52   Temp 98.1 F (36.7 C) (Oral)   Resp (!) 9   Ht '6\' 1"'$  (1.854 m)   Wt 155 lb (70.3 kg)   SpO2 100%   BMI 20.45 kg/m  General:   Alert,  pleasant and cooperative in NAD Head:  Normocephalic and atraumatic. Neck:  Supple; Lungs:  Clear throughout to auscultation.    Heart:  Regular rate and rhythm. Abdomen:  Soft, nontender and nondistended. Normal bowel sounds, without guarding, and without rebound.   Neurologic:  Alert and  oriented x4;  grossly normal neurologically.  Impression/Plan:     SCREENING  Plan:  1. TCS  TODAY. DISCUSSED PROCEDURE, BENEFITS, & RISKS: < 1% chance of medication reaction, bleeding, perforation, or rupture of spleen/liver.

## 2016-03-16 ENCOUNTER — Encounter (HOSPITAL_COMMUNITY): Payer: Self-pay | Admitting: Gastroenterology

## 2016-04-03 ENCOUNTER — Ambulatory Visit (INDEPENDENT_AMBULATORY_CARE_PROVIDER_SITE_OTHER): Payer: Self-pay | Admitting: Otolaryngology

## 2016-04-07 ENCOUNTER — Other Ambulatory Visit (HOSPITAL_COMMUNITY): Payer: Self-pay | Admitting: Oncology

## 2016-04-07 DIAGNOSIS — B370 Candidal stomatitis: Secondary | ICD-10-CM

## 2016-04-07 DIAGNOSIS — C01 Malignant neoplasm of base of tongue: Secondary | ICD-10-CM

## 2016-04-07 MED ORDER — OXYCODONE HCL 20 MG PO TABS: 20.0000 mg | ORAL_TABLET | Freq: Four times a day (QID) | ORAL | 0 refills | Status: DC | PRN

## 2016-04-07 MED ORDER — FIRST-DUKES MOUTHWASH MT SUSP
5.0000 mL | Freq: Four times a day (QID) | OROMUCOSAL | 0 refills | Status: DC | PRN
Start: 2016-04-07 — End: 2018-02-15

## 2016-04-10 ENCOUNTER — Encounter (HOSPITAL_COMMUNITY): Payer: Self-pay | Admitting: Lab

## 2016-04-10 NOTE — Progress Notes (Unsigned)
Referral sent to [facility] for port removal.  appt 2/1 '@10'$ . Patient aware

## 2016-04-13 ENCOUNTER — Ambulatory Visit (INDEPENDENT_AMBULATORY_CARE_PROVIDER_SITE_OTHER): Payer: Self-pay | Admitting: Otolaryngology

## 2016-04-13 ENCOUNTER — Telehealth (HOSPITAL_COMMUNITY): Payer: Self-pay | Admitting: Oncology

## 2016-04-13 NOTE — Telephone Encounter (Signed)
Per Gershon Mussel let pt know that medicare denied his dukes mouthwash.

## 2016-04-20 ENCOUNTER — Encounter (HOSPITAL_COMMUNITY): Payer: Self-pay

## 2016-05-11 ENCOUNTER — Telehealth (HOSPITAL_COMMUNITY): Payer: Self-pay | Admitting: *Deleted

## 2016-05-11 ENCOUNTER — Other Ambulatory Visit (HOSPITAL_COMMUNITY): Payer: Self-pay | Admitting: Oncology

## 2016-05-11 DIAGNOSIS — C01 Malignant neoplasm of base of tongue: Secondary | ICD-10-CM

## 2016-05-11 MED ORDER — OXYCODONE HCL 20 MG PO TABS: 20.0000 mg | ORAL_TABLET | Freq: Four times a day (QID) | ORAL | 0 refills | Status: DC | PRN

## 2016-05-11 NOTE — Telephone Encounter (Signed)
Rx printed TK

## 2016-06-07 ENCOUNTER — Telehealth (HOSPITAL_COMMUNITY): Payer: Self-pay | Admitting: *Deleted

## 2016-06-08 ENCOUNTER — Other Ambulatory Visit (HOSPITAL_COMMUNITY): Payer: Self-pay | Admitting: Oncology

## 2016-06-08 DIAGNOSIS — C01 Malignant neoplasm of base of tongue: Secondary | ICD-10-CM

## 2016-06-08 MED ORDER — OXYCODONE HCL 20 MG PO TABS: 20.0000 mg | ORAL_TABLET | Freq: Four times a day (QID) | ORAL | 0 refills | Status: DC | PRN

## 2016-06-08 NOTE — Assessment & Plan Note (Addendum)
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 S/P curative concomitant chemo/XRT with remission on PET imaging in October 2016.  Oncology history is updated.  Labs today: CBC diff, CMET.  I personally reviewed and went over laboratory results with the patient.  The results are noted within this dictation.  Labs in 4 months: CBC diff, CMET, TSH.  He wants to have his port removed by Dr. [name], but this was cancelled due to inclement weather.  We will help to get this rescheduled.  I will be decreasing his Oxycodone next month to #20 x 1 and then #15 x 1-2 followed by discontinue opoid prescribing.  He is in agreement with this plan.  He uses his pain medication infrequently and only uses it before/after consuming meats which cause him odynophagia.  He is advised to follow-up with ENT as scheduled.  Colonoscopy completed on 03/13/2016 which was unimpressive.  He will be due for his repeat colonoscopy in 2027.  Return in 4 months follow-up.  If doing well at follow-up in 4 months, we can consider decreasing frequency of follow-up appointment in accordance with the NCCN guidelines for year #3 of surveillance.

## 2016-06-08 NOTE — Progress Notes (Signed)
Manon Hilding, MD [place] [address]  Oropharyngeal cancer [facility])  CURRENT THERAPY: Surveillance per NCCN guidelines  INTERVAL HISTORY: Alexander [name] 66 y.o. 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     Oropharyngeal cancer ([place])   07/27/2014 Imaging    CT neck- Advanced stage oropharyngeal cancer with necrotic adenopathy accounting for the left neck swelling.      07/28/2014 Initial Diagnosis    Oropharyngeal cancer      08/03/2014 Imaging    CT CAP- L supraclavicular lymphadenopathy is not completely visualized. This is better seen on the previous neck CT from 07/27/2014. Otherwise, no evidence for metastatic disease in the chest, abdomen, or pelvis.      08/03/2014 Imaging    Bone scan- Uptake at adjacent anterior LEFT 6, 7, 8 ribs likely representing trauma/fractures. Questionable nonspecific increased tracer localization at the posterior RIGHT 8th and 9th ribs, the adjacent nature which raises a a question of trauma as well      08/06/2014 Pathology Results    Dr. Benjamine Mola- Oropharynx, biopsy, Left - INVASIVE SQUAMOUS CELL CARCINOMA.      08/12/2014 Procedure    Dr. Enrique Sack- 1. Multiple extraction of tooth numbers 6, 17, 22, 23, 24, 25, 26, and 27. 3 Quadrants of alveoloplasty      08/17/2014 Pathology Results    PORT and G-TUBE placed by Dr. Carlis Stable.      08/26/2014 PET scan    Large hypermetabolic mass in the left base of tongue. Activity extends across midline to the right base tongue. 2. Intensely hypermetabolic left cervical metastatic lymph nodes. Lymph nodes extend from the left level II position to the left supraclavi      09/01/2014 - 09/22/2014 Chemotherapy    Concurrent chemoradiation with Cisplatin 100 mg/m2 x 2 cycles with Neulasta support. Held cycle #3 d/t renal toxicity.       09/03/2014 - 10/23/2014 Radiation Therapy    Treated in [place], IMRT Isidore Moos).  Base of tongue and bilat neck. Total dose:  70 Gy in 35 fractions. (of note, he did miss several treatments requiring BID dosing towards the end of treatment).       01/25/2015 PET scan    Near complete resolution of metabolic activity at the base of tongue. Minimal residual activity is likely post treatment effect. 2. Complete resolution of metabolic activity above LEFT cervical lymph nodes. No evidence of residual metabolically active         He looks wonderful.  His appetite is strong his weight is stable.  He denies any nausea or vomiting.  He notes that he does have follow with Dr. Benjamine Mola.  He reports minimal need for pain medication and therefore will continue with his tapering eventually cease from prescribing this medication over the next 2-4 months.  He notes only discomfort with hard foods such as meats.  He reports that it causes a scratchy painful sensation.  He otherwise denies any complaints.  He notes that he was scheduled to have his Port-A-Cath removed by Dr. [name] but due to inclement weather, this was canceled.  Will make sure we get him back to Dr. [name] have his port removed.  He denies any aspiration complaints.  He  continue to work for a [employer].  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  Review of Systems  Constitutional: Negative.  Negative for chills, fever and weight loss.  HENT: Negative.   Eyes: Negative.   Respiratory: Negative.  Negative for cough.   Cardiovascular: Negative.  Negative for chest pain.  Gastrointestinal: Negative.  Negative for blood in stool, constipation, diarrhea, melena, nausea and vomiting.  Genitourinary: Negative.   Musculoskeletal: Negative.   Skin: Negative.   Neurological: Negative.  Negative for weakness.  Endo/Heme/Allergies: Negative.   Psychiatric/Behavioral: Negative.     Past Medical History:  Diagnosis Date    . 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 Oropharyngeal cancer ([place]) 07/28/2014   dx. 3 weeks ago.- Dr. Oneal Deputy center [place], [place].  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 COLONOSCOPY N/A 03/13/2016   Procedure: COLONOSCOPY;  Surgeon: Danie Binder, MD;  [facility];  Service: Endoscopy;  Laterality: N/A;  2:15 PM  . ESOPHAGOGASTRODUODENOSCOPY (EGD) WITH PROPOFOL N/A 08/17/2014   Procedure: ESOPHAGOGASTRODUODENOSCOPY (EGD) WITH PROPOFOL (procedure #1);  Surgeon: Aviva Signs Md, MD;  Location: [facility];  Service: General;  Laterality: N/A;  . MULTIPLE EXTRACTIONS WITH ALVEOLOPLASTY N/A 08/12/2014   Procedure: Extraction of tooth #'s 6,17,22,23,24,25,26,27 with alveoloplasty;  Surgeon: Lenn Cal, DDS;  [facility];  Service: Oral Surgery;  Laterality: N/A;  . PANENDOSCOPY N/A 08/06/2014   Procedure: PANENDOSCOPY WITH BIOPSY;  Surgeon: Leta Baptist, MD;  Location: [place];  Service: ENT;  Laterality: N/A;  . PEG PLACEMENT Left 08/17/14  . PEG PLACEMENT N/A 08/17/2014   Procedure: PERCUTANEOUS ENDOSCOPIC GASTROSTOMY (PEG) PLACEMENT (procedure #1);  Surgeon: Aviva Signs Md, MD;  Location[facility];  Service: General;  Laterality: N/A;  . PORTACATH PLACEMENT Right 08/17/14  . PORTACATH PLACEMENT Right 08/17/2014   Procedure: INSERTION PORT-A-CATH (procedure #2);  Surgeon: Aviva Signs Md, MD;  [facility];  Service: General;  Laterality: Right;    No family history on file.  Social History   Social History  . Marital status: Married    Spouse name: N/A  . Number of children: 5  . Years of education: N/A   Social History Main Topics  . Smoking status: Former Smoker    Packs/day: 0.50    Years: 30.00    Quit date: 07/22/2014  . Smokeless tobacco: Never Used     Comment: Quit smoking a week ago  .  Alcohol use No     Comment: 1-2 beers on the weekend  . Drug use: No  . Sexual activity: Not on file   Other Topics Concern  . Not on file   Social History Narrative  . No narrative on file     PHYSICAL EXAMINATION  ECOG PERFORMANCE STATUS: 0 - Asymptomatic  Vitals:   06/09/16 1021  BP: 117/63  Pulse: 64  Resp: 16  Temp: 98.1 F (36.7 C)    GENERAL:alert, well nourished, well developed, comfortable, cooperative, smiling and comfortable, unaccompanied, nicely dressed. SKIN: skin color, texture, turgor are normal, no rashes or significant lesions HEAD: Normocephalic, No masses, lesions, tenderness or abnormalities EYES: normal, EOMI, Conjunctiva are pink and non-injected EARS: External ears normal OROPHARYNX:lips, buccal mucosa, and tongue normal and mucous membranes are moist, upper and lower dentures in place. NECK: supple, trachea midline LYMPH:  no palpable lymphadenopathy  BREAST:not examined LUNGS: clear to auscultation and percussion HEART: regular rate & rhythm, no murmurs and no gallops ABDOMEN:abdomen soft and normal bowel sounds BACK: Back symmetric, no curvature. EXTREMITIES:less then 2 second capillary refill, no joint deformities, effusion, or inflammation, no skin discoloration, no cyanosis  NEURO: alert & oriented x 3 with fluent speech, no focal motor/sensory deficits, gait normal   LABORATORY DATA: CBC    Component Value Date/Time   WBC 5.0 06/09/2016 0956   RBC 4.47 06/09/2016 0956   HGB 13.7 06/09/2016 0956   HCT 41.6 06/09/2016 0956   PLT 190 06/09/2016 0956   MCV 93.1 06/09/2016 0956   MCH 30.6 06/09/2016 0956   MCHC 32.9 06/09/2016 0956   RDW 14.9 06/09/2016 0956   LYMPHSABS 1.0 06/09/2016 0956   MONOABS 0.6 06/09/2016 0956   EOSABS 0.1 06/09/2016 0956   BASOSABS 0.0 06/09/2016 0956      Chemistry      Component Value Date/Time   NA 138 06/09/2016 0956   K 4.3 06/09/2016 0956   CL 103 06/09/2016 0956   CO2 25 06/09/2016 0956    BUN 14 06/09/2016 0956   CREATININE 1.88 (H) 06/09/2016 0956      Component Value Date/Time   CALCIUM 9.1 06/09/2016 0956   ALKPHOS 53 06/09/2016 0956   AST 19 06/09/2016 0956   ALT 11 (L) 06/09/2016 0956   BILITOT 0.5 06/09/2016 0956        PENDING LABS:   RADIOGRAPHIC STUDIES:  No results found.   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 S/P curative concomitant chemo/XRT with remission on PET imaging in October 2016.  Oncology history is updated.  Labs today: CBC diff, CMET.  I personally reviewed and went over laboratory results with the patient.  The results are noted within this dictation.  Labs in 4 months: CBC diff, CMET, TSH.  He wants to have his port removed by Dr. [name], but this was cancelled due to inclement weather.  We will help to get this rescheduled.  I will be decreasing his Oxycodone next month to #20 x 1 and then #15 x 1-2 followed by discontinue opoid prescribing.  He is in agreement with this plan.  He uses his pain medication infrequently and only uses it before/after consuming meats which cause him odynophagia.  He is advised to follow-up with ENT as scheduled.  Colonoscopy completed on 03/13/2016 which was unimpressive.  He will be due for his repeat colonoscopy in 2027.  Return in 4 months follow-up.  If doing well at follow-up in 4 months, we can consider decreasing frequency of follow-up appointment in accordance with the NCCN guidelines for year #3 of surveillance.   ORDERS PLACED FOR THIS ENCOUNTER: No orders of the defined types were placed in this encounter.   MEDICATIONS PRESCRIBED THIS ENCOUNTER: No orders of the defined types were placed in this encounter.   THERAPY PLAN:  NCCN guidelines for surveillance of Head and Neck cancer recommends (2.2017):  A. H+P every 1-3 months for year 1  B. H+P every 2-6 months for year 2  C. H+P every 4-8 months for  years 3-5  D. H+P every year for years greater than 5  E. Post-treatment baseline imaging of primary (and neck, if treated) recommended within 6 months of treatment (category 2B).   F. Chest imaging as clinically indicated for patients with smoking history.  G. Further re-imaging as indicated based on worrisome  or equivocal signs/symptoms, smoking history, and areas inaccessible to clinical examination.  H. Routine annual imaging may be indicated in areas difficult to visualize on exam.  I. TSH every 6-12 months if neck irradiated.  J. Dental evaluation   1. Recommended for oral cavity and sites exposed to significant intraoral radiation treatment.  K. Consider EBV DNA monitoring for nasopharyngeal cancer (Category 2B).  L. Supportive care and rehabilitation   1. Speech/hearing and swallowing evaluation and rehabilitation as clinically indicated.   2. Nutritional evaluation and rehabilitation as clinically indicated until nutritional status is stabilized.   3. Ongoing surveillance for depression   4. Smoking cessation and alcohol counseling as clinically indicated.  M. For response assessment immediately after chemoradiation or RT (see FOLL-A 2 of 2).  N. Integration of survivorship care and care plan within 1 year, complementary to ongoing involvement from a head and neck oncologist.   All questions were answered. The patient knows to call the clinic with any problems, questions or concerns. We can certainly see the patient much sooner if necessary.  Patient and plan discussed with Dr. Twana First and she is in agreement with the aforementioned.   This note is electronically signed by: Doy Mince 06/09/2016 10:41 AM

## 2016-06-09 ENCOUNTER — Encounter (HOSPITAL_COMMUNITY): Payer: Medicare Other | Attending: Oncology | Admitting: Oncology

## 2016-06-09 ENCOUNTER — Encounter (HOSPITAL_COMMUNITY): Payer: Medicare Other

## 2016-06-09 ENCOUNTER — Encounter (HOSPITAL_COMMUNITY): Payer: Self-pay

## 2016-06-09 ENCOUNTER — Encounter (HOSPITAL_COMMUNITY): Payer: Self-pay | Admitting: Oncology

## 2016-06-09 DIAGNOSIS — Z87891 Personal history of nicotine dependence: Secondary | ICD-10-CM | POA: Diagnosis not present

## 2016-06-09 DIAGNOSIS — Z9889 Other specified postprocedural states: Secondary | ICD-10-CM | POA: Diagnosis not present

## 2016-06-09 DIAGNOSIS — Z923 Personal history of irradiation: Secondary | ICD-10-CM | POA: Diagnosis not present

## 2016-06-09 DIAGNOSIS — C109 Malignant neoplasm of oropharynx, unspecified: Secondary | ICD-10-CM | POA: Insufficient documentation

## 2016-06-09 DIAGNOSIS — Z85810 Personal history of malignant neoplasm of tongue: Secondary | ICD-10-CM | POA: Insufficient documentation

## 2016-06-09 LAB — CBC WITH DIFFERENTIAL/PLATELET
BASOS ABS: 0 10*3/uL (ref 0.0–0.1)
Basophils Relative: 0 %
Eosinophils Absolute: 0.1 10*3/uL (ref 0.0–0.7)
Eosinophils Relative: 3 %
HEMATOCRIT: 41.6 % (ref 39.0–52.0)
Hemoglobin: 13.7 g/dL (ref 13.0–17.0)
LYMPHS PCT: 19 %
Lymphs Abs: 1 10*3/uL (ref 0.7–4.0)
MCH: 30.6 pg (ref 26.0–34.0)
MCHC: 32.9 g/dL (ref 30.0–36.0)
MCV: 93.1 fL (ref 78.0–100.0)
MONO ABS: 0.6 10*3/uL (ref 0.1–1.0)
Monocytes Relative: 12 %
NEUTROS ABS: 3.3 10*3/uL (ref 1.7–7.7)
Neutrophils Relative %: 66 %
Platelets: 190 10*3/uL (ref 150–400)
RBC: 4.47 MIL/uL (ref 4.22–5.81)
RDW: 14.9 % (ref 11.5–15.5)
WBC: 5 10*3/uL (ref 4.0–10.5)

## 2016-06-09 LAB — COMPREHENSIVE METABOLIC PANEL
ALK PHOS: 53 U/L (ref 38–126)
ALT: 11 U/L — AB (ref 17–63)
AST: 19 U/L (ref 15–41)
Albumin: 4 g/dL (ref 3.5–5.0)
Anion gap: 10 (ref 5–15)
BILIRUBIN TOTAL: 0.5 mg/dL (ref 0.3–1.2)
BUN: 14 mg/dL (ref 6–20)
CALCIUM: 9.1 mg/dL (ref 8.9–10.3)
CO2: 25 mmol/L (ref 22–32)
CREATININE: 1.88 mg/dL — AB (ref 0.61–1.24)
Chloride: 103 mmol/L (ref 101–111)
GFR calc Af Amer: 41 mL/min — ABNORMAL LOW (ref >60)
GFR calc non Af Amer: 36 mL/min — ABNORMAL LOW (ref >60)
Glucose, Bld: 132 mg/dL — ABNORMAL HIGH (ref 65–99)
Potassium: 4.3 mmol/L (ref 3.5–5.1)
Sodium: 138 mmol/L (ref 135–145)
TOTAL PROTEIN: 7.2 g/dL (ref 6.5–8.1)

## 2016-06-09 NOTE — Patient Instructions (Signed)
[place] at [facility] Discharge Instructions  RECOMMENDATIONS MADE BY THE CONSULTANT AND ANY TEST RESULTS WILL BE SENT TO YOUR REFERRING PHYSICIAN.  Labs look great today. We will work with you on decreasing you pain medication and we will stop this medication in the near future.  Follow-up with Dr. Benjamine Mola as directed. We will refer you back to Dr. [name] for rescheduling of your port removal. Return in 4 months for follow-up with lab work. Call with any questions or concerns.  Thank you for choosing [place] at [facility] to provide your oncology and hematology care.  To afford each patient quality time with our provider, please arrive at least 15 minutes before your scheduled appointment time.    If you have a lab appointment with the [facility] please come in thru the  Main Entrance and check in at the main information desk  You need to re-schedule your appointment should you arrive 10 or more minutes late.  We strive to give you quality time with our providers, and arriving late affects you and other patients whose appointments are after yours.  Also, if you no show three or more times for appointments you may be dismissed from the clinic at the providers discretion.     Again, thank you for choosing [facility].  Our hope is that these requests will decrease the amount of time that you wait before being seen by our physicians.       _____________________________________________________________  Should you have questions after your visit to [facility], please contact our office at [phone] between the hours of 8:30 a.m. and 4:30 p.m.  Voicemails left after 4:30 p.m. will not be returned until the following business day.  For prescription refill requests, have your pharmacy contact our office.       Resources For Cancer Patients and their Caregivers ? American Cancer Society: Can assist with  transportation, wigs, general needs, runs Look Good Feel Better.        3085344331 ? Cancer Care: Provides financial assistance, online support groups, medication/co-pay assistance.  1-800-813-HOPE (774)364-3832) ? [place] Assists [place] cancer patients and their families through emotional , educational and financial support.  (539)290-8991 ? [place] DSS Where to apply for food stamps, Medicaid and utility assistance. 7572269699 ? [facility]: Transportation to medical appointments. 973-619-6736 ? Social Security Administration: May apply for disability if have a Stage IV cancer. 714-002-1974 684 256 2642 ? [employer], Disability and Transit Services: Assists with nutrition, care and transit needs. [place] Support Programs: '@10RELATIVEDAYS'$ @ > Cancer Support Group  2nd Tuesday of the month 1pm-2pm, Journey Room  > Creative Journey  3rd Tuesday of the month 1130am-1pm, Journey Room  > Look Good Feel Better  1st Wednesday of the month 10am-12 noon, Journey Room (Call [place] to register [phone])

## 2016-07-04 ENCOUNTER — Encounter: Payer: Self-pay | Admitting: General Surgery

## 2016-07-04 ENCOUNTER — Ambulatory Visit (INDEPENDENT_AMBULATORY_CARE_PROVIDER_SITE_OTHER): Payer: Medicare Other | Admitting: General Surgery

## 2016-07-04 VITALS — BP 139/68 | HR 57 | Temp 98.4°F | Resp 18 | Ht 73.0 in | Wt 154.0 lb

## 2016-07-04 DIAGNOSIS — C109 Malignant neoplasm of oropharynx, unspecified: Secondary | ICD-10-CM | POA: Diagnosis not present

## 2016-07-04 NOTE — H&P (Signed)
Alexander [name]; [id number]; [birth year]   HPI  patient is a 66 year old black male with a history of oropharyngeal cancer who is referred by oncology for Port-A-Cath removal.  He has finished his chemotherapy.     Past Medical History:  Diagnosis Date  . 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 Oropharyngeal cancer ([place]) 07/28/2014   dx. 3 weeks ago.- Dr. Oneal Deputy center [place], [place].  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 COLONOSCOPY N/A 03/13/2016   Procedure: COLONOSCOPY;  Surgeon: Danie Binder, MD;  [facility];  Service: Endoscopy;  Laterality: N/A;  2:15 PM  . ESOPHAGOGASTRODUODENOSCOPY (EGD) WITH PROPOFOL N/A 08/17/2014   Procedure: ESOPHAGOGASTRODUODENOSCOPY (EGD) WITH PROPOFOL (procedure #1);  Surgeon: Aviva Signs Md, MD;  Location[facility];  Service: General;  Laterality: N/A;  . MULTIPLE EXTRACTIONS WITH ALVEOLOPLASTY N/A 08/12/2014   Procedure: Extraction of tooth #'s 6,17,22,23,24,25,26,27 with alveoloplasty;  Surgeon: Lenn Cal, DDS;  [facility];  Service: Oral Surgery;  Laterality: N/A;  . PANENDOSCOPY N/A 08/06/2014   Procedure: PANENDOSCOPY WITH BIOPSY;  Surgeon: Leta Baptist, MD;  Location: [place];  Service: ENT;  Laterality: N/A;  . PEG PLACEMENT Left 08/17/14  . PEG PLACEMENT N/A 08/17/2014   Procedure: PERCUTANEOUS ENDOSCOPIC GASTROSTOMY (PEG) PLACEMENT (procedure #1);  Surgeon: Aviva Signs Md, MD;  Location[facility];  Service: General;  Laterality: N/A;  . PORTACATH PLACEMENT Right 08/17/14  . PORTACATH PLACEMENT Right 08/17/2014   Procedure: INSERTION PORT-A-CATH (procedure #2);  Surgeon: Aviva Signs Md, MD;  Location: [facility];  Service: General;  Laterality: Right;    History reviewed. No pertinent family history.        Current Outpatient Prescriptions on  File Prior to Visit  Medication Sig Dispense Refill  . Diphenhyd-Hydrocort-Nystatin (FIRST-DUKES MOUTHWASH) SUSP Use as directed 5 mLs in the mouth or throat 4 (four) times daily as needed. 300 mL 0  . Oxycodone HCl 20 MG TABS Take 1 tablet (20 mg total) by mouth 4 (four) times daily as needed (throat pain). 30 tablet 0   No current facility-administered medications on file prior to visit.     No Known Allergies       History  Alcohol Use No    Comment: 1-2 beers on the weekend          History  Smoking Status  . Former Smoker  . Packs/day: 0.50  . Years: 30.00  . Quit date: 07/22/2014  Smokeless Tobacco  . Never Used    Comment: Quit smoking a week ago    Review of Systems  Constitutional: Negative.   HENT: Negative.   Eyes: Negative.   Respiratory: Negative.   Cardiovascular: Negative.   Gastrointestinal: Negative.   Genitourinary: Negative.   Musculoskeletal: Negative.   Skin: Negative.   Neurological: Negative.   Endo/Heme/Allergies: Negative.   Psychiatric/Behavioral: Negative.     Objective      Vitals:   07/04/16 1026  BP: 139/68  Pulse: (!) 57  Resp: 18  Temp: 98.4 F (36.9 C)    Physical Exam  Constitutional: He is oriented to person, place, and time and well-developed, well-nourished, and in no distress.  HENT:  Head: Normocephalic and atraumatic.  Neck: Normal range of motion. Neck supple.  Cardiovascular: Normal rate, regular rhythm  and normal heart sounds.   No murmur heard. Pulmonary/Chest: Effort normal and breath sounds normal. He has no wheezes. He has no rales.  Port-A-Cath in place in the right upper chest.  Neurological: He is alert and oriented to person, place, and time.  Skin: Skin is warm and dry.  Vitals reviewed.   Assessment    Oral pharyngeal carcinoma, finished with chemotherapy Plan    scheduled for Port-A-Cath removal in the minor procedure room on 07/12/2016.  Risks and benefits of the procedure  were fully explained to the patient, who gave informed consent.

## 2016-07-04 NOTE — Patient Instructions (Signed)
Implanted Port Removal Implanted port removal is a procedure to remove the port and catheter (port-a-cath) that is implanted under your skin. The port is a small disc under your skin that can be punctured with a needle. It is connected to a vein in your chest or neck by a small flexible tube (catheter). The port-a-cath is used for treatment through an IV tube and for taking blood samples. Your health care provider will remove the port-a-cath if:  You no longer need it for treatment.  It is not working properly.  The area around it gets infected. Tell a health care provider about:  Any allergies you have.  All medicines you are taking, including vitamins, herbs, eye drops, creams, and over-the-counter medicines.  Any problems you or family members have had with anesthetic medicines.  Any blood disorders you have.  Any surgeries you have had.  Any medical conditions you have.  Whether you are pregnant or may be pregnant. What are the risks? Generally, this is a safe procedure. However, problems may occur, including:  Infection.  Bleeding.  Allergic reactions to anesthetic medicines.  Damage to nerves or blood vessels. What happens before the procedure?  You will have:  A physical exam.  Blood tests.  Imaging tests, including a chest X-ray.  Follow instructions from your health care provider about eating or drinking restrictions.  Ask your health care provider about:  Changing or stopping your regular medicines. This is especially important if you are taking diabetes medicines or blood thinners.  Taking medicines such as aspirin and ibuprofen. These medicines can thin your blood. Do not take these medicines before your procedure if your surgeon instructs you not to.  Ask your health care provider how your surgical site will be marked or identified.  You may be given antibiotic medicine to help prevent infection.  Plan to have someone take you home after the  procedure.  If you will be going home right after the procedure, plan to have someone stay with you for 24 hours. What happens during the procedure?  To reduce your risk of infection:  Your health care team will wash or sanitize their hands.  Your skin will be washed with soap.  You may be given one or more of the following:  A medicine to help you relax (sedative).  A medicine to numb the area (local anesthetic).  A small cut (incision) will be made at the site of your port-a-cath.  The port-a-cath and the catheter that has been inside your vein will gently be removed.  The incision will be closed with stitches (sutures), adhesive strips, or skin glue.  A bandage (dressing) will be placed over the incision. The procedure may vary among health care providers and hospitals. What happens after the procedure?  Your blood pressure, heart rate, breathing rate, and blood oxygen level will be monitored often until the medicines you were given have worn off.  Do not drive for 24 hours if you received a sedative. This information is not intended to replace advice given to you by your health care provider. Make sure you discuss any questions you have with your health care provider. Document Released: 02/22/2015 Document Revised: 08/19/2015 Document Reviewed: 12/16/2014 Elsevier Interactive Patient Education  2017 [employer].

## 2016-07-04 NOTE — Progress Notes (Signed)
Alexander [name]; [id number]; 1950/07/15   HPI  patient is a 66 year old black male with a history of oropharyngeal cancer who is referred by oncology for Port-A-Cath removal.  He has finished his chemotherapy. Past Medical History:  Diagnosis Date  . 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 Oropharyngeal cancer ([place]) 07/28/2014   dx. 3 weeks ago.- Dr. Oneal [facility] [place], [place].  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 COLONOSCOPY N/A 03/13/2016   Procedure: COLONOSCOPY;  Surgeon: Danie Binder, MD;  [facility];  Service: Endoscopy;  Laterality: N/A;  2:15 PM  . ESOPHAGOGASTRODUODENOSCOPY (EGD) WITH PROPOFOL N/A 08/17/2014   Procedure: ESOPHAGOGASTRODUODENOSCOPY (EGD) WITH PROPOFOL (procedure #1);  Surgeon: Aviva Signs Md, MD;  Location: [facility];  Service: General;  Laterality: N/A;  . MULTIPLE EXTRACTIONS WITH ALVEOLOPLASTY N/A 08/12/2014   Procedure: Extraction of tooth #'s 6,17,22,23,24,25,26,27 with alveoloplasty;  Surgeon: Lenn Cal, DDS;  Location: [facility];  Service: Oral Surgery;  Laterality: N/A;  . PANENDOSCOPY N/A 08/06/2014   Procedure: PANENDOSCOPY WITH BIOPSY;  Surgeon: Leta Baptist, MD;  Location: [facility];  Service: ENT;  Laterality: N/A;  . PEG PLACEMENT Left 08/17/14  . PEG PLACEMENT N/A 08/17/2014   Procedure: PERCUTANEOUS ENDOSCOPIC GASTROSTOMY (PEG) PLACEMENT (procedure #1);  Surgeon: Aviva Signs Md, MD;  Location: [facility];  Service: General;  Laterality: N/A;  . PORTACATH PLACEMENT Right 08/17/14  . PORTACATH PLACEMENT Right 08/17/2014   Procedure: INSERTION PORT-A-CATH (procedure #2);  Surgeon: Aviva Signs Md, MD;  Location: [facility];  Service: General;  Laterality: Right;    History reviewed. No pertinent family history.  Current Outpatient Prescriptions on File Prior to Visit  Medication Sig  Dispense Refill  . Diphenhyd-Hydrocort-Nystatin (FIRST-DUKES MOUTHWASH) SUSP Use as directed 5 mLs in the mouth or throat 4 (four) times daily as needed. 300 mL 0  . Oxycodone HCl 20 MG TABS Take 1 tablet (20 mg total) by mouth 4 (four) times daily as needed (throat pain). 30 tablet 0   No current facility-administered medications on file prior to visit.     No Known Allergies  History  Alcohol Use No    Comment: 1-2 beers on the weekend    History  Smoking Status  . Former Smoker  . Packs/day: 0.50  . Years: 30.00  . Quit date: 07/22/2014  Smokeless Tobacco  . Never Used    Comment: Quit smoking a week ago    Review of Systems  Constitutional: Negative.   HENT: Negative.   Eyes: Negative.   Respiratory: Negative.   Cardiovascular: Negative.   Gastrointestinal: Negative.   Genitourinary: Negative.   Musculoskeletal: Negative.   Skin: Negative.   Neurological: Negative.   Endo/Heme/Allergies: Negative.   Psychiatric/Behavioral: Negative.     Objective   Vitals:   07/04/16 1026  BP: 139/68  Pulse: (!) 57  Resp: 18  Temp: 98.4 F (36.9 C)    Physical Exam  Constitutional: He is oriented to person, place, and time and well-developed, well-nourished, and in no distress.  HENT:  Head: Normocephalic and atraumatic.  Neck: Normal range of motion. Neck supple.  Cardiovascular: Normal rate, regular rhythm and normal heart sounds.   No murmur heard. Pulmonary/Chest: Effort normal and breath sounds normal. He has no wheezes. He has no rales.  Port-A-Cath in place in  the right upper chest.  Neurological: He is alert and oriented to person, place, and time.  Skin: Skin is warm and dry.  Vitals reviewed.   Assessment    Oral pharyngeal carcinoma, finished with chemotherapy Plan    scheduled for Port-A-Cath removal in the minor procedure room on 07/12/2016.  Risks and benefits of the procedure were fully explained to the patient, who gave informed consent.

## 2016-07-07 ENCOUNTER — Telehealth (HOSPITAL_COMMUNITY): Payer: Self-pay | Admitting: *Deleted

## 2016-07-07 ENCOUNTER — Encounter (HOSPITAL_COMMUNITY): Payer: Self-pay | Admitting: Adult Health

## 2016-07-07 ENCOUNTER — Other Ambulatory Visit (HOSPITAL_COMMUNITY): Payer: Self-pay | Admitting: Adult Health

## 2016-07-07 DIAGNOSIS — C01 Malignant neoplasm of base of tongue: Secondary | ICD-10-CM

## 2016-07-07 MED ORDER — OXYCODONE HCL 20 MG PO TABS: 20.0000 mg | ORAL_TABLET | Freq: Four times a day (QID) | ORAL | 0 refills | Status: DC | PRN

## 2016-07-07 NOTE — Progress Notes (Signed)
Patient called cancer center requesting refill of oxycodone.   [place] Controlled Substance Registry reviewed and refill is appropriate. Based on Tom Kefalas, PA-C's last note, #20 pill limit on this prescription, as we are attempting to wean opiates.  Paper prescription printed and left at cancer center front desk for patient to retrieve after showing photo ID per clinic policy.   [place] reviewed:     [name], NP [place] [phone]

## 2016-07-07 NOTE — Telephone Encounter (Signed)
Prescription printed; #20 based on Tom's last note. (see documentation encounter).   [name], NP [place] [phone]

## 2016-07-12 ENCOUNTER — Ambulatory Visit (HOSPITAL_COMMUNITY)
Admission: RE | Admit: 2016-07-12 | Discharge: 2016-07-12 | Disposition: A | Discharge status: Home / Self Care | Payer: Medicare Other | Source: Ambulatory Visit | Attending: General Surgery | Admitting: General Surgery

## 2016-07-12 MED ORDER — LIDOCAINE HCL (PF) 1 % IJ SOLN
INTRAMUSCULAR | Status: AC
  Filled 2016-07-12: qty 30

## 2016-07-17 ENCOUNTER — Encounter (HOSPITAL_COMMUNITY)
Admission: RE | Disposition: A | Discharge status: Home / Self Care | Payer: Self-pay | Source: Ambulatory Visit | Attending: General Surgery

## 2016-07-17 ENCOUNTER — Ambulatory Visit (HOSPITAL_COMMUNITY)
Admission: RE | Admit: 2016-07-17 | Discharge: 2016-07-17 | Disposition: A | Discharge status: Home / Self Care | Payer: Medicare Other | Source: Ambulatory Visit | Attending: General Surgery | Admitting: General Surgery

## 2016-07-17 ENCOUNTER — Encounter (HOSPITAL_COMMUNITY): Payer: Self-pay

## 2016-07-17 DIAGNOSIS — Z87891 Personal history of nicotine dependence: Secondary | ICD-10-CM | POA: Insufficient documentation

## 2016-07-17 DIAGNOSIS — Z452 Encounter for adjustment and management of vascular access device: Secondary | ICD-10-CM | POA: Diagnosis not present

## 2016-07-17 DIAGNOSIS — Z79899 Other long term (current) drug therapy: Secondary | ICD-10-CM | POA: Insufficient documentation

## 2016-07-17 DIAGNOSIS — Z9221 Personal history of antineoplastic chemotherapy: Secondary | ICD-10-CM | POA: Diagnosis not present

## 2016-07-17 DIAGNOSIS — C109 Malignant neoplasm of oropharynx, unspecified: Secondary | ICD-10-CM

## 2016-07-17 DIAGNOSIS — Z85810 Personal history of malignant neoplasm of tongue: Secondary | ICD-10-CM | POA: Diagnosis not present

## 2016-07-17 HISTORY — PX: PORT-A-CATH REMOVAL: SHX5289

## 2016-07-17 SURGERY — MINOR REMOVAL PORT-A-CATH
Anesthesia: LOCAL | Site: Chest | Laterality: Right

## 2016-07-17 SURGERY — MINOR REMOVAL PORT-A-CATH
Anesthesia: LOCAL | Laterality: Right

## 2016-07-17 MED ORDER — LIDOCAINE HCL (PF) 1 % IJ SOLN
INTRAMUSCULAR | Status: DC | PRN
  Administered 2016-07-17: 5 mL

## 2016-07-17 MED ORDER — CHLORHEXIDINE GLUCONATE CLOTH 2 % EX PADS: 6.0000 | MEDICATED_PAD | Freq: Once | CUTANEOUS | Status: DC

## 2016-07-17 MED ORDER — LIDOCAINE HCL (PF) 1 % IJ SOLN
INTRAMUSCULAR | Status: AC
  Filled 2016-07-17: qty 30

## 2016-07-17 SURGICAL SUPPLY — 19 items
CHLORAPREP W/TINT 10.5 ML (MISCELLANEOUS) ×2 IMPLANT
CLOTH BEACON ORANGE TIMEOUT ST (SAFETY) ×2 IMPLANT
DECANTER SPIKE VIAL GLASS SM (MISCELLANEOUS) ×2 IMPLANT
DERMABOND ADVANCED (GAUZE/BANDAGES/DRESSINGS) ×1
DERMABOND ADVANCED .7 DNX12 (GAUZE/BANDAGES/DRESSINGS) ×1 IMPLANT
DRAPE PROXIMA HALF (DRAPES) ×2 IMPLANT
ELECT REM PT RETURN 9FT ADLT (ELECTROSURGICAL) ×2
ELECTRODE REM PT RTRN 9FT ADLT (ELECTROSURGICAL) ×1 IMPLANT
GLOVE BIOGEL PI IND STRL 7.0 (GLOVE) ×2 IMPLANT
GLOVE BIOGEL PI INDICATOR 7.0 (GLOVE) ×2
GLOVE SURG SS PI 7.5 STRL IVOR (GLOVE) ×2 IMPLANT
GOWN STRL REUS W/TWL LRG LVL3 (GOWN DISPOSABLE) ×2 IMPLANT
NEEDLE HYPO 25X1 1.5 SAFETY (NEEDLE) ×2 IMPLANT
PENCIL HANDSWITCHING (ELECTRODE) IMPLANT
SUT VIC AB 3-0 SH 27 (SUTURE) ×1
SUT VIC AB 3-0 SH 27X BRD (SUTURE) ×1 IMPLANT
SUT VIC AB 4-0 PS2 27 (SUTURE) ×2 IMPLANT
SYR CONTROL 10ML LL (SYRINGE) ×2 IMPLANT
TOWEL OR 17X26 4PK STRL BLUE (TOWEL DISPOSABLE) ×2 IMPLANT

## 2016-07-17 SURGICAL SUPPLY — 19 items
CLOTH BEACON ORANGE TIMEOUT ST (SAFETY) ×2 IMPLANT
DECANTER SPIKE VIAL GLASS SM (MISCELLANEOUS) ×2 IMPLANT
DRAPE PROXIMA HALF (DRAPES) ×2 IMPLANT
DURAPREP 6ML APPLICATOR 50/CS (WOUND CARE) ×2 IMPLANT
ELECT REM PT RETURN 9FT ADLT (ELECTROSURGICAL) ×2
ELECTRODE REM PT RTRN 9FT ADLT (ELECTROSURGICAL) ×1 IMPLANT
GLOVE BIOGEL PI IND STRL 7.0 (GLOVE) ×1 IMPLANT
GLOVE BIOGEL PI INDICATOR 7.0 (GLOVE) ×1
GLOVE SURG SS PI 7.5 STRL IVOR (GLOVE) ×2 IMPLANT
GOWN STRL REUS W/TWL LRG LVL3 (GOWN DISPOSABLE) ×2 IMPLANT
LIQUID BAND (GAUZE/BANDAGES/DRESSINGS) ×2 IMPLANT
NEEDLE HYPO 25X1 1.5 SAFETY (NEEDLE) ×2 IMPLANT
PENCIL HANDSWITCHING (ELECTRODE) ×2 IMPLANT
SPONGE GAUZE 2X2 8PLY STRL LF (GAUZE/BANDAGES/DRESSINGS) ×2 IMPLANT
SUT VIC AB 3-0 SH 27 (SUTURE) ×1
SUT VIC AB 3-0 SH 27X BRD (SUTURE) ×1 IMPLANT
SUT VIC AB 4-0 PS2 27 (SUTURE) ×2 IMPLANT
SYR CONTROL 10ML LL (SYRINGE) ×2 IMPLANT
TOWEL OR 17X26 4PK STRL BLUE (TOWEL DISPOSABLE) ×2 IMPLANT

## 2016-07-17 NOTE — Interval H&P Note (Signed)
History and Physical Interval Note:  07/17/2016 10:38 AM  (Patient seen at 10:10am)  Alexander [name]  has presented today for surgery, with the diagnosis of pharyngeal cancer  The various methods of treatment have been discussed with the patient and family. After consideration of risks, benefits and other options for treatment, the patient has consented to  Procedure(s): MINOR REMOVAL PORT-A-CATH (Right) as a surgical intervention .  The patient's history has been reviewed, patient examined, no change in status, stable for surgery.  I have reviewed the patient's chart and labs.  Questions were answered to the patient's satisfaction.     Aviva Signs

## 2016-07-17 NOTE — Discharge Instructions (Signed)
Implanted Port Removal, Care After Refer to this sheet in the next few weeks. These instructions provide you with information about caring for yourself after your procedure. Your health care provider may also give you more specific instructions. Your treatment has been planned according to current medical practices, but problems sometimes occur. Call your health care provider if you have any problems or questions after your procedure. What can I expect after the procedure? After the procedure, it is common to have:  Soreness or pain near your incision.  Some swelling or bruising near your incision. Follow these instructions at home: Medicines   Take over-the-counter and prescription medicines only as told by your health care provider.  If you were prescribed an antibiotic medicine, take it as told by your health care provider. Do not stop taking the antibiotic even if you start to feel better. Bathing   Do not take baths, swim, or use a hot tub until your health care provider approves. Ask your health care provider if you can take showers. You may only be allowed to take sponge baths for bathing. Incision care   Follow instructions from your health care provider about how to take care of your incision. Make sure you:  Wash your hands with soap and water before you change your bandage (dressing). If soap and water are not available, use hand sanitizer.  Change your dressing as told by your health care provider.  Keep your dressing dry.  Leave stitches (sutures), skin glue, or adhesive strips in place. These skin closures may need to stay in place for 2 weeks or longer. If adhesive strip edges start to loosen and curl up, you may trim the loose edges. Do not remove adhesive strips completely unless your health care provider tells you to do that.  Check your incision area every day for signs of infection. Check for:  More redness, swelling, or pain.  More fluid or  blood.  Warmth.  Pus or a bad smell. Driving   If you received a sedative, do not drive for 24 hours after the procedure.  If you did not receive a sedative, ask your health care provider when it is safe to drive. Activity   Return to your normal activities as told by your health care provider. Ask your health care provider what activities are safe for you.  Until your health care provider says it is safe:  Do not lift anything that is heavier than 10 lb (4.5 kg).  Do not do activities that involve lifting your arms over your head. General instructions   Do not use any tobacco products, such as cigarettes, chewing tobacco, and e-cigarettes. Tobacco can delay healing. If you need help quitting, ask your health care provider.  Keep all follow-up visits as told by your health care provider. This is important. Contact a health care provider if:  You have more redness, swelling, or pain around your incision.  You have more fluid or blood coming from your incision.  Your incision feels warm to the touch.  You have pus or a bad smell coming from your incision.  You have a fever.  You have pain that is not relieved by your pain medicine. Get help right away if:  You have chest pain.  You have difficulty breathing. This information is not intended to replace advice given to you by your health care provider. Make sure you discuss any questions you have with your health care provider. Document Released: 02/22/2015 Document Revised: 08/19/2015 Document  Reviewed: 12/16/2014 Elsevier Interactive Patient Education  2017 [employer].

## 2016-07-17 NOTE — H&P (View-Only) (Signed)
Alexander [name]; [id number]; [birth year]   HPI  patient is a 66 year old black male with a history of oropharyngeal cancer who is referred by oncology for Port-A-Cath removal.  He has finished his chemotherapy.     Past Medical History:  Diagnosis Date  . 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 Oropharyngeal cancer ([place]) 07/28/2014   dx. 3 weeks ago.- Dr. Oneal [facility] [place], [place].  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 COLONOSCOPY N/A 03/13/2016   Procedure: COLONOSCOPY;  Surgeon: Danie Binder, MD;  [facility];  Service: Endoscopy;  Laterality: N/A;  2:15 PM  . ESOPHAGOGASTRODUODENOSCOPY (EGD) WITH PROPOFOL N/A 08/17/2014   Procedure: ESOPHAGOGASTRODUODENOSCOPY (EGD) WITH PROPOFOL (procedure #1);  Surgeon: Aviva Signs Md, MD;  [facility];  Service: General;  Laterality: N/A;  . MULTIPLE EXTRACTIONS WITH ALVEOLOPLASTY N/A 08/12/2014   Procedure: Extraction of tooth #'s 6,17,22,23,24,25,26,27 with alveoloplasty;  Surgeon: Lenn Cal, DDS;  [facility];  Service: Oral Surgery;  Laterality: N/A;  . PANENDOSCOPY N/A 08/06/2014   Procedure: PANENDOSCOPY WITH BIOPSY;  Surgeon: Leta Baptist, MD;  Location: [facility];  Service: ENT;  Laterality: N/A;  . PEG PLACEMENT Left 08/17/14  . PEG PLACEMENT N/A 08/17/2014   Procedure: PERCUTANEOUS ENDOSCOPIC GASTROSTOMY (PEG) PLACEMENT (procedure #1);  Surgeon: Aviva Signs Md, MD;  Location[facility];  Service: General;  Laterality: N/A;  . PORTACATH PLACEMENT Right 08/17/14  . PORTACATH PLACEMENT Right 08/17/2014   Procedure: INSERTION PORT-A-CATH (procedure #2);  Surgeon: Aviva Signs Md, MD;  Location[facility];  Service: General;  Laterality: Right;    History reviewed. No pertinent family history.        Current Outpatient Prescriptions on  File Prior to Visit  Medication Sig Dispense Refill  . Diphenhyd-Hydrocort-Nystatin (FIRST-DUKES MOUTHWASH) SUSP Use as directed 5 mLs in the mouth or throat 4 (four) times daily as needed. 300 mL 0  . Oxycodone HCl 20 MG TABS Take 1 tablet (20 mg total) by mouth 4 (four) times daily as needed (throat pain). 30 tablet 0   No current facility-administered medications on file prior to visit.     No Known Allergies       History  Alcohol Use No    Comment: 1-2 beers on the weekend          History  Smoking Status  . Former Smoker  . Packs/day: 0.50  . Years: 30.00  . Quit date: 07/22/2014  Smokeless Tobacco  . Never Used    Comment: Quit smoking a week ago    Review of Systems  Constitutional: Negative.   HENT: Negative.   Eyes: Negative.   Respiratory: Negative.   Cardiovascular: Negative.   Gastrointestinal: Negative.   Genitourinary: Negative.   Musculoskeletal: Negative.   Skin: Negative.   Neurological: Negative.   Endo/Heme/Allergies: Negative.   Psychiatric/Behavioral: Negative.     Objective      Vitals:   07/04/16 1026  BP: 139/68  Pulse: (!) 57  Resp: 18  Temp: 98.4 F (36.9 C)    Physical Exam  Constitutional: He is oriented to person, place, and time and well-developed, well-nourished, and in no distress.  HENT:  Head: Normocephalic and atraumatic.  Neck: Normal range of motion. Neck supple.  Cardiovascular: Normal rate, regular rhythm  and normal heart sounds.   No murmur heard. Pulmonary/Chest: Effort normal and breath sounds normal. He has no wheezes. He has no rales.  Port-A-Cath in place in the right upper chest.  Neurological: He is alert and oriented to person, place, and time.  Skin: Skin is warm and dry.  Vitals reviewed.   Assessment    Oral pharyngeal carcinoma, finished with chemotherapy Plan    scheduled for Port-A-Cath removal in the minor procedure room on 07/12/2016.  Risks and benefits of the procedure  were fully explained to the patient, who gave informed consent.

## 2016-07-17 NOTE — Op Note (Signed)
Patient:  Alexander [name]  DOB:  [birth year]  MRN:  [id number]   Preop Diagnosis:  Squamous cell carcinoma, finished with chemotherapy  Postop Diagnosis:  Same  Procedure:  Port-A-Cath removal  Surgeon:  Aviva Signs, M.D.  Anes:  Local  Indications:  Patient is a 66 year old black male status post treatment for laryngeal carcinoma who now presents for Port-A-Cath removal. He has finished with chemotherapy. The risks and benefits of the procedure were fully explained to the patient, who gave informed consent.  Procedure note:  The patient was placed in the supine position. The right upper chest was prepped and draped using usual sterile technique with DuraPrep. Surgical site confirmation was performed. One percent Xylocaine was used for local anesthesia.  An incision was made through the previous surgical incision site. The dissection was taken down to the port. The Port-A-Cath was removed in total without difficulty. It was disposed of. The subcutaneous layer was reapproximated using a 3-0 Vicryl interrupted suture. The skin was closed using a 4-0 Vicryl subcuticular suture. Dermabond was applied.  All tape and needle counts were correct at the end of the procedure. The patient was discharged in good condition.  Complications:  None  EBL:  None  Specimen:  None

## 2016-07-20 ENCOUNTER — Encounter (HOSPITAL_COMMUNITY): Payer: Self-pay | Admitting: General Surgery

## 2016-08-07 ENCOUNTER — Other Ambulatory Visit (HOSPITAL_COMMUNITY): Payer: Self-pay | Admitting: Oncology

## 2016-08-07 DIAGNOSIS — C01 Malignant neoplasm of base of tongue: Secondary | ICD-10-CM

## 2016-08-07 MED ORDER — OXYCODONE HCL 20 MG PO TABS: 15.0000 mg | ORAL_TABLET | Freq: Four times a day (QID) | ORAL | 0 refills | Status: DC | PRN

## 2016-09-07 ENCOUNTER — Other Ambulatory Visit (HOSPITAL_COMMUNITY): Payer: Self-pay | Admitting: Oncology

## 2016-09-07 ENCOUNTER — Telehealth (HOSPITAL_COMMUNITY): Payer: Self-pay | Admitting: *Deleted

## 2016-09-07 DIAGNOSIS — C01 Malignant neoplasm of base of tongue: Secondary | ICD-10-CM

## 2016-09-07 MED ORDER — OXYCODONE HCL 20 MG PO TABS: 20.0000 mg | ORAL_TABLET | Freq: Four times a day (QID) | ORAL | 0 refills | Status: DC | PRN

## 2016-09-07 NOTE — Telephone Encounter (Signed)
Printed

## 2016-10-09 ENCOUNTER — Other Ambulatory Visit (HOSPITAL_COMMUNITY): Payer: Self-pay | Admitting: Oncology

## 2016-10-09 ENCOUNTER — Telehealth (HOSPITAL_COMMUNITY): Payer: Self-pay | Admitting: *Deleted

## 2016-10-09 DIAGNOSIS — C01 Malignant neoplasm of base of tongue: Secondary | ICD-10-CM

## 2016-10-09 MED ORDER — OXYCODONE HCL 20 MG PO TABS: 20.0000 mg | ORAL_TABLET | Freq: Four times a day (QID) | ORAL | 0 refills | Status: DC | PRN

## 2016-10-10 ENCOUNTER — Other Ambulatory Visit (HOSPITAL_COMMUNITY): Payer: Self-pay | Admitting: *Deleted

## 2016-10-10 DIAGNOSIS — C109 Malignant neoplasm of oropharynx, unspecified: Secondary | ICD-10-CM

## 2016-10-10 IMAGING — CT CT CHEST W/ CM
2 of 5 series · 12 of 36 positions shown, 15 images · IV contrast (Omnipaque 300)
Comparison: None.

CLINICAL DATA: Subsequent encounter for head and neck cancer.

EXAM:
CT CHEST, ABDOMEN, AND PELVIS WITH CONTRAST
TECHNIQUE: Multidetector CT imaging of the chest, abdomen and pelvis was
performed following the standard protocol during bolus
administration of intravenous contrast.
CONTRAST:  100mL OMNIPAQUE IOHEXOL 300 MG/ML  SOLN

[Series 2: cap with 5.0 b40f · axial · 0.67mm/px · z∈[+665,+1255]mm · 9 of 138 slices shown, 12 images]
[im 10/138  mediastinal]
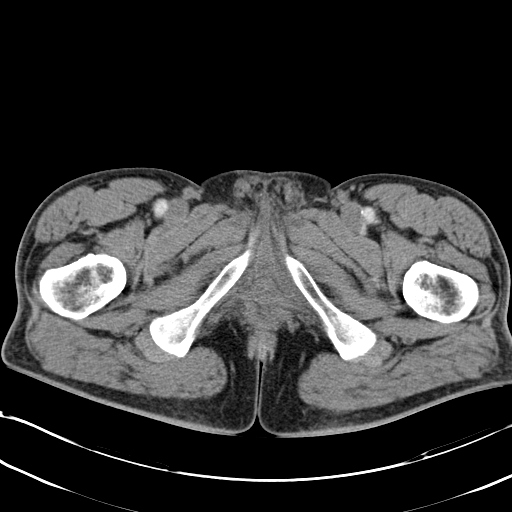
[im 10/138  lung]
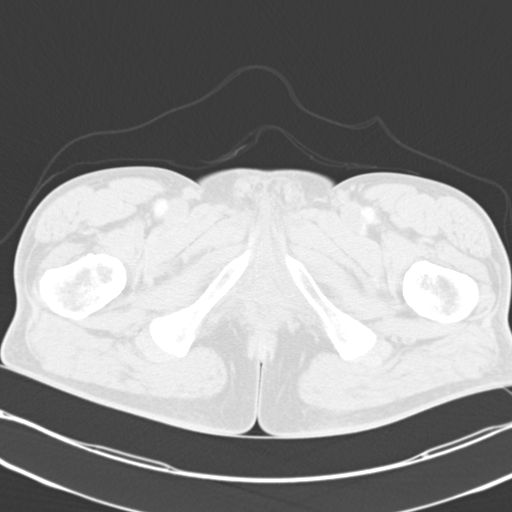
[im 28/138  lung]
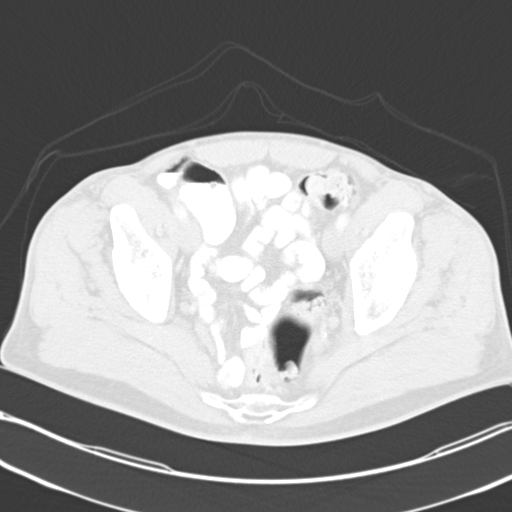
[im 37/138  lung]
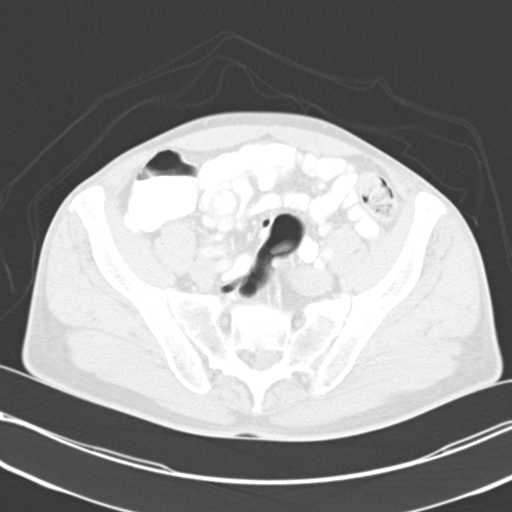
[im 55/138  lung]
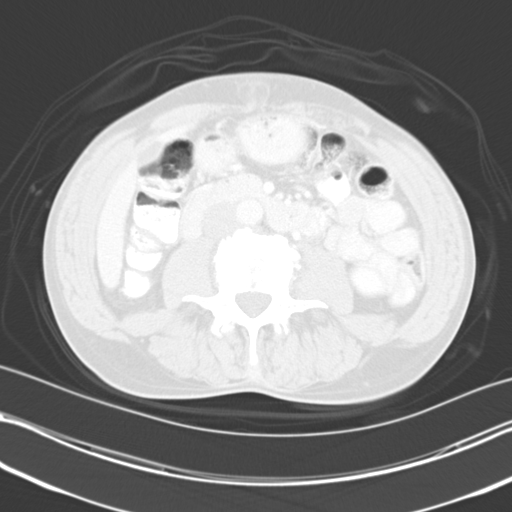
[im 74/138  mediastinal]
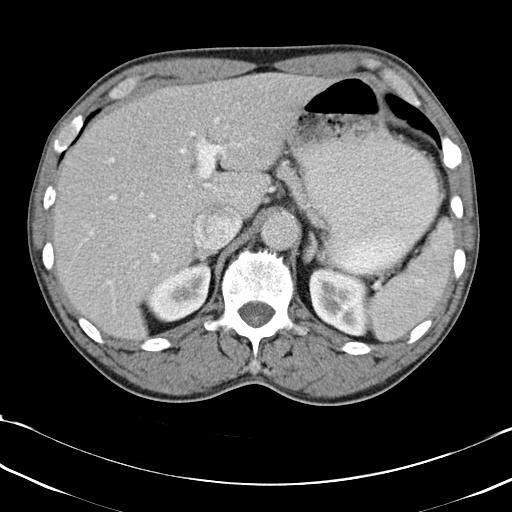
[im 74/138  lung]
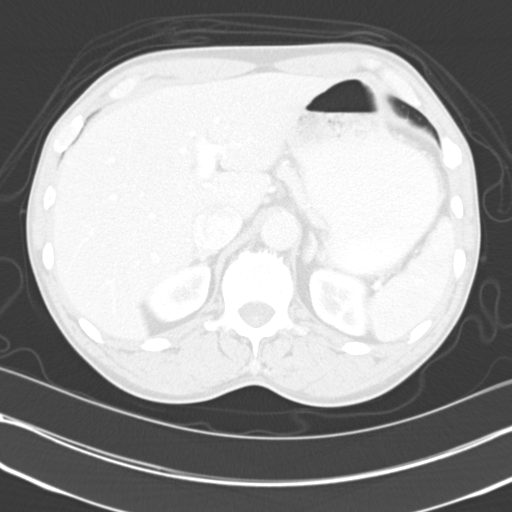
[im 83/138  lung]
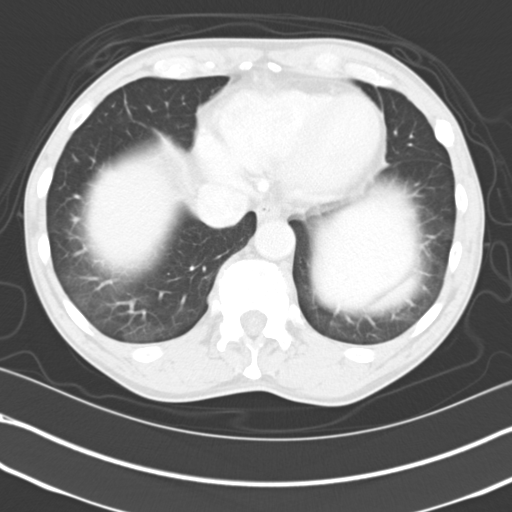
[im 101/138  lung]
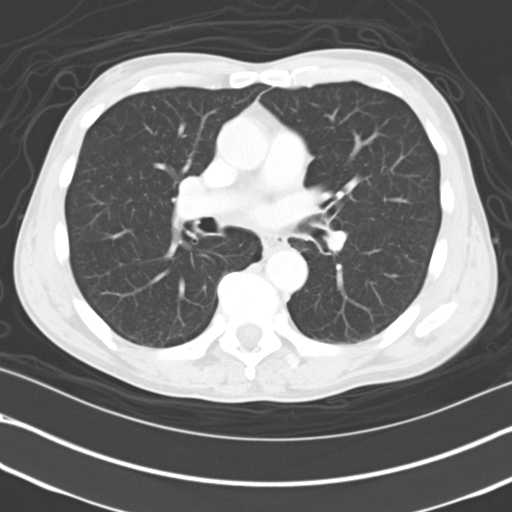
[im 110/138  lung]
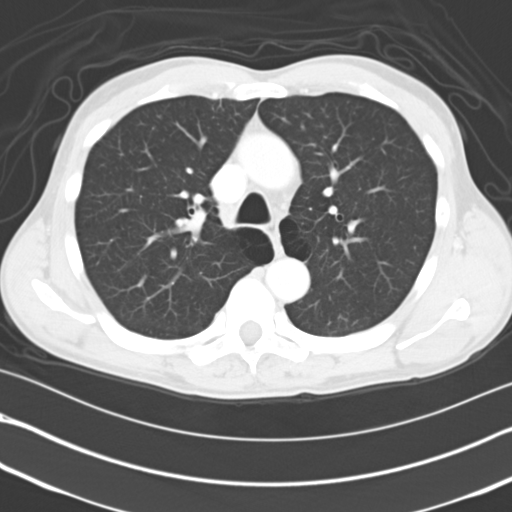
[im 128/138  mediastinal]
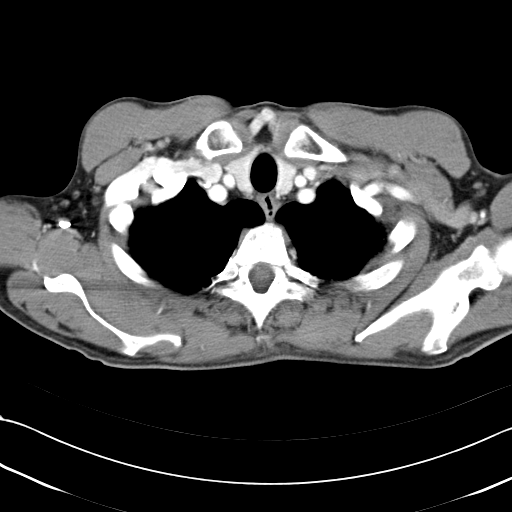
[im 128/138  lung]
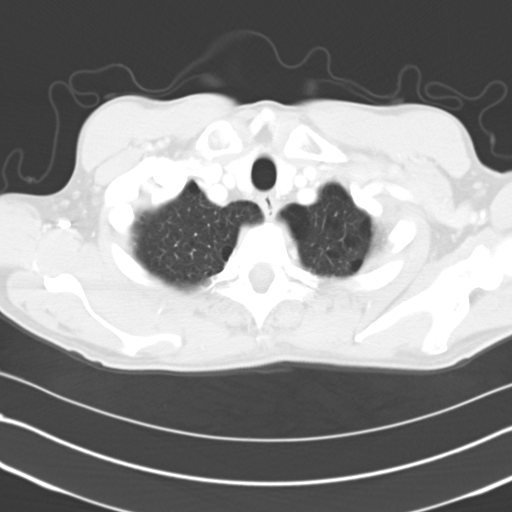

[Series 3: mpr cor post contrast (id) · coronal · 0.63mm/px · 3 of 92 slices shown]
[im 19/92  lung]
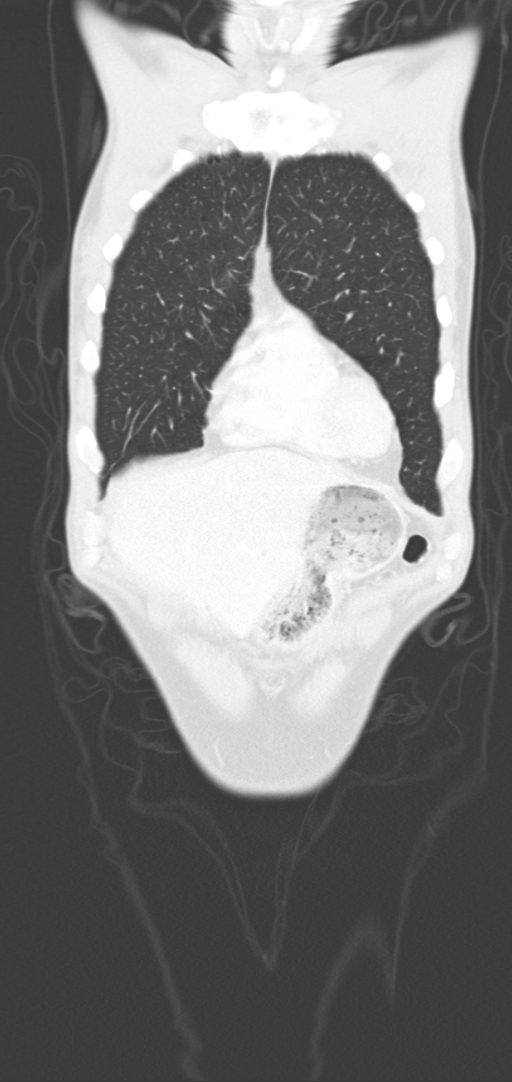
[im 37/92  lung]
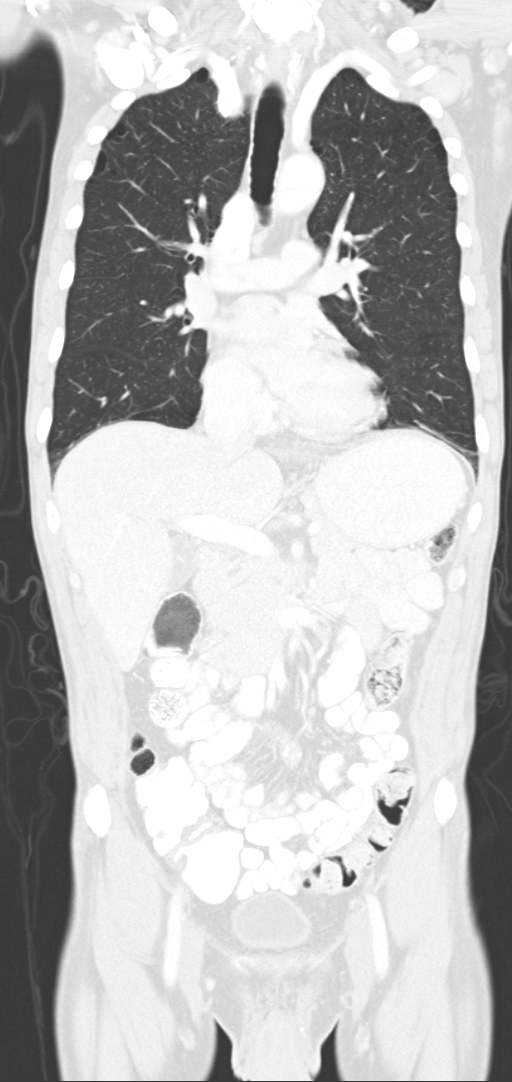
[im 55/92  lung]
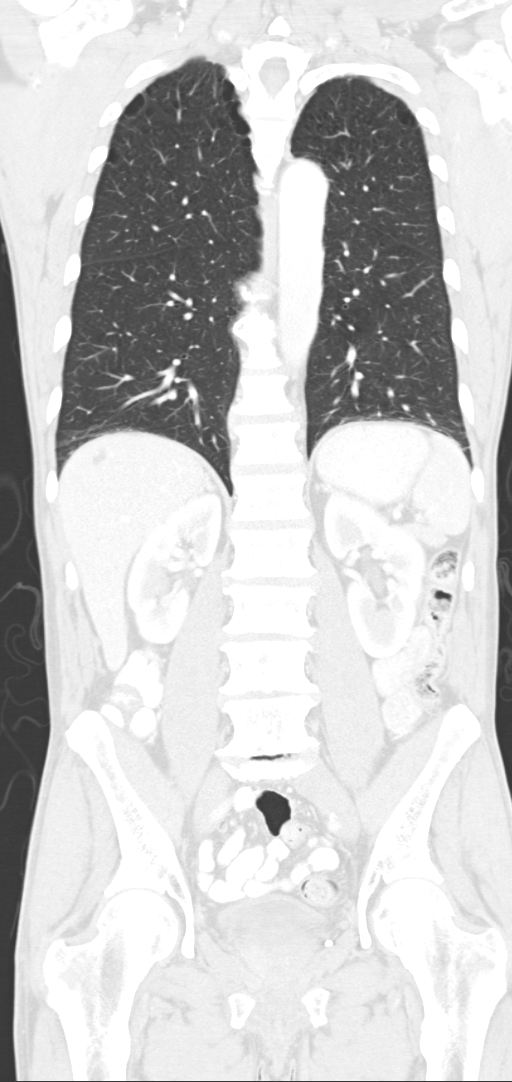

[12 of 36 positions shown; findings below may reference images not displayed]

FINDINGS: CT CHEST FINDINGS

Mediastinum/Nodes: No axillary lymphadenopathy. Left supraclavicular
lymphadenopathy was better seen on the previous neck CT. No
mediastinal or hilar lymphadenopathy. Heart size is normal. No
pericardial effusion.

Lungs/Pleura: Lung windows demonstrate centrilobular and paraseptal
emphysema. No pulmonary edema or focal airspace consolidation. No
suspicious pulmonary parenchymal nodule or mass. No evidence for
pleural effusion.

Musculoskeletal: Bone windows reveal no worrisome lytic or sclerotic
osseous lesions.

CT ABDOMEN AND PELVIS FINDINGS

Hepatobiliary: Multiple small cysts are seen scattered in the liver
parenchyma. There is no evidence for gallstones, gallbladder wall
thickening, or pericholecystic fluid. No intrahepatic or
extrahepatic biliary dilation.

Pancreas: No focal mass lesion. No dilatation of the main duct. No
intraparenchymal cyst. No peripancreatic edema.

Spleen: No splenomegaly. No focal mass lesion.

Adrenals/Urinary Tract: No adrenal nodule or mass. Kidneys are
unremarkable. No evidence for hydroureter. Urinary bladder is normal
by CT imaging.

Stomach/Bowel: Stomach is nondistended. No gastric wall thickening.
No evidence of outlet obstruction. Duodenum is normally positioned
as is the ligament of Treitz. No small bowel wall thickening. No
small bowel dilatation. Terminal ileum is normal. The appendix is
normal. No gross colonic mass. No colonic wall thickening. No
substantial diverticular change.

Vascular/Lymphatic: There is abdominal aortic atherosclerosis
without aneurysm. No gastrohepatic or hepatoduodenal ligament
lymphadenopathy. No retroperitoneal lymphadenopathy appears no
evidence for pelvic sidewall lymphadenopathy.

Reproductive: Prostate gland and seminal vesicles are unremarkable.

Other: No intraperitoneal free fluid.

Musculoskeletal: Bone windows reveal no worrisome lytic or sclerotic
osseous lesions. Nonacute fractures of the anterior left sixth
through ninth ribs are evident. No evidence of these fractures are
pathologic and the presence on contiguous ribs would be more
consistent with trauma.
IMPRESSION: 1. Left supraclavicular lymphadenopathy is been incompletely
visualized. This was better seen on the previous neck CT from
07/27/2014.
[DATE]. Otherwise, no evidence for metastatic disease in the chest,
abdomen, or pelvis.

## 2016-10-11 ENCOUNTER — Other Ambulatory Visit (HOSPITAL_COMMUNITY): Payer: Self-pay

## 2016-10-11 ENCOUNTER — Ambulatory Visit (HOSPITAL_COMMUNITY): Payer: Self-pay | Admitting: Adult Health

## 2016-10-11 NOTE — Progress Notes (Deleted)
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      07/28/2014 Initial Diagnosis    Oropharyngeal cancer      08/03/2014 Imaging    CT CAP- L supraclavicular lymphadenopathy is not completely visualized. This is better seen on the previous neck CT from 07/27/2014. Otherwise, no evidence for metastatic disease in the chest, abdomen, or pelvis.      08/03/2014 Imaging    Bone scan- Uptake at adjacent anterior LEFT 6, 7, 8 ribs likely representing trauma/fractures. Questionable nonspecific increased tracer localization at the posterior RIGHT 8th and 9th ribs, the adjacent nature which raises a a question of trauma as well      08/06/2014 Pathology Results    Dr. Benjamine Mola- Oropharynx, biopsy, Left - INVASIVE SQUAMOUS CELL CARCINOMA.      08/12/2014 Procedure    Dr. Enrique Sack- 1. Multiple extraction of tooth numbers 6, 17, 22, 23, 24, 25, 26, and 27. 3 Quadrants of alveoloplasty      08/17/2014 Pathology Results    PORT and G-TUBE placed by Dr. Carlis Stable.      08/26/2014 PET scan    Large hypermetabolic mass in the left base of tongue. Activity extends across midline to the right base tongue. 2. Intensely hypermetabolic left cervical metastatic lymph nodes. Lymph nodes extend from the left level II position to the left supraclavi      09/01/2014 - 09/22/2014 Chemotherapy    Concurrent chemoradiation with Cisplatin 100 mg/m2 x 2 cycles with Neulasta support. Held cycle #3 d/t renal toxicity.       09/03/2014 - 10/23/2014 Radiation Therapy    Treated in [facility], IMRT Isidore Moos).  Base of tongue and bilat neck. Total  dose: 70 Gy in 35 fractions. (of note, he did miss several treatments requiring BID dosing towards the end of treatment).       01/25/2015 PET scan    Near complete resolution of metabolic activity at the base of tongue. Minimal residual activity is likely post treatment effect. 2. Complete resolution of metabolic activity above LEFT cervical lymph nodes. No evidence of residual metabolically active       07/17/2016 Procedure    Port-a-cath removed [name])         HISTORY OF PRESENT ILLNESS:     INTERVAL HISTORY:    REVIEW OF SYSTEMS:  Review of Systems - Oncology   PAST MEDICAL/SURGICAL HISTORY:  Past Medical History:  Diagnosis Date  . 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 3 weeks ago.- Dr. Oneal Deputy center [place], [place].  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 COLONOSCOPY N/A 03/13/2016   Procedure: COLONOSCOPY;  Surgeon: Danie Binder, MD;  [facility];  Service: Endoscopy;  Laterality: N/A;  2:15 PM  . ESOPHAGOGASTRODUODENOSCOPY (EGD) WITH PROPOFOL N/A 08/17/2014   Procedure: ESOPHAGOGASTRODUODENOSCOPY (EGD) WITH PROPOFOL (procedure #1);  Surgeon: Aviva Signs Md, MD;  Location: [facility];  Service: General;  Laterality: N/A;  . MULTIPLE EXTRACTIONS WITH ALVEOLOPLASTY N/A 08/12/2014   Procedure: Extraction of tooth #'s 6,17,22,23,24,25,26,27 with alveoloplasty;  Surgeon: Lenn Cal, DDS;  Location: [facility];  Service: Oral Surgery;  Laterality: N/A;  . PANENDOSCOPY N/A 08/06/2014   Procedure: PANENDOSCOPY WITH BIOPSY;  Surgeon: Leta Baptist, MD;  Location: [facility];  Service: ENT;  Laterality: N/A;  . PEG PLACEMENT Left 08/17/14  . PEG PLACEMENT N/A 08/17/2014   Procedure: PERCUTANEOUS ENDOSCOPIC GASTROSTOMY (PEG) PLACEMENT (procedure #1);  Surgeon: Aviva Signs Md, MD;  Location: [facility];  Service: General;  Laterality: N/A;  . PORT-A-CATH REMOVAL Right 07/17/2016   Procedure: MINOR REMOVAL PORT-A-CATH;  Surgeon: Aviva Signs, MD;  Location: [facility];  Service: General;  Laterality: Right;  . PORTACATH PLACEMENT Right 08/17/14  . PORTACATH PLACEMENT Right 08/17/2014   Procedure: INSERTION PORT-A-CATH (procedure #2);  Surgeon: Aviva Signs Md, MD;  Location: [facility];  Service: General;  Laterality: Right;     SOCIAL HISTORY:  Social History   Social History  . Marital status: Married    Spouse name: N/A  . Number of children: 5  . Years of education: N/A   Occupational History  . Not on file.   Social History Main Topics  . Smoking status: Former Smoker    Packs/day: 0.50    Years: 30.00    Quit date: 07/22/2014  . Smokeless tobacco: Never Used     Comment: Quit smoking a week ago  . Alcohol use No     Comment: 1-2 beers on the weekend  . Drug use: No  . Sexual activity: Not on file   Other Topics Concern  . Not on file   Social History Narrative  . No narrative on file    FAMILY HISTORY:  No family history on file.  CURRENT MEDICATIONS:  Outpatient Encounter Prescriptions as of 10/11/2016  Medication Sig  . Diphenhyd-Hydrocort-Nystatin (FIRST-DUKES MOUTHWASH) SUSP Use as directed 5 mLs in the mouth or throat 4 (four) times daily as needed.  . Oxycodone HCl 20 MG TABS Take 1 tablet (20 mg total) by mouth 4 (four) times daily as needed (throat pain).   No facility-administered encounter medications on file as of 10/11/2016.     ALLERGIES:  No Known Allergies   PHYSICAL EXAM:  ECOG Performance status: ***  There were no vitals filed for this visit. There were no vitals filed for this visit.  Physical Exam   LABORATORY DATA:  I have reviewed the labs as listed.  CBC    Component Value Date/Time   WBC 5.0 06/09/2016 0956   RBC 4.47 06/09/2016 0956   HGB 13.7 06/09/2016 0956   HCT 41.6 06/09/2016 0956   PLT  190 06/09/2016 0956   MCV 93.1 06/09/2016 0956   MCH 30.6 06/09/2016 0956   MCHC 32.9 06/09/2016 0956   RDW 14.9 06/09/2016 0956   LYMPHSABS 1.0 06/09/2016 0956   MONOABS 0.6 06/09/2016 0956   EOSABS 0.1 06/09/2016 0956   BASOSABS 0.0 06/09/2016 0956   CMP Latest Ref Rng & Units 06/09/2016 09/10/2015 07/21/2015  Glucose 65 - 99 mg/dL 132(H) 117(H) 129(H)  BUN 6 - 20 mg/dL 14 25(H) 16  Creatinine 0.61 - 1.24 mg/dL 1.88(H) 2.16(H) 1.88(H)  Sodium 135 - 145 mmol/L 138 137 138  Potassium 3.5 - 5.1 mmol/L 4.3 4.3 3.7  Chloride 101 - 111 mmol/L 103 106 105  CO2 22 - 32 mmol/L 25 24 24   Calcium 8.9 -  10.3 mg/dL 9.1 9.1 9.2  Total Protein 6.5 - 8.1 g/dL 7.2 7.2 6.8  Total Bilirubin 0.3 - 1.2 mg/dL 0.5 0.7 0.6  Alkaline Phos 38 - 126 U/L 53 63 49  AST 15 - 41 U/L 19 22 25   ALT 17 - 63 U/L 11(L) 17 19    PENDING LABS:    DIAGNOSTIC IMAGING:  *The following radiologic images and reports have been reviewed independently and agree with below findings.  ***  PATHOLOGY:  ***   ASSESSMENT & PLAN:         Dispo:  -   All questions were answered to patient's stated satisfaction. Encouraged patient to call with any new concerns or questions before his next visit to the cancer center and we can certain see him sooner, if needed.    Plan of care discussed with Dr. ***, who agrees with the above aforementioned.    Orders placed this encounter:  No orders of the defined types were placed in this encounter.     [name], NP [place] [phone]

## 2016-11-07 ENCOUNTER — Encounter (HOSPITAL_COMMUNITY): Payer: Self-pay | Admitting: Adult Health

## 2016-11-07 ENCOUNTER — Other Ambulatory Visit (HOSPITAL_COMMUNITY): Payer: Self-pay | Admitting: Adult Health

## 2016-11-07 DIAGNOSIS — C01 Malignant neoplasm of base of tongue: Secondary | ICD-10-CM

## 2016-11-07 MED ORDER — OXYCODONE HCL 20 MG PO TABS: 20.0000 mg | ORAL_TABLET | Freq: Four times a day (QID) | ORAL | 0 refills | Status: DC | PRN

## 2016-11-07 NOTE — Progress Notes (Signed)
Patient called cancer center requesting refill of Oxycodone.   [place] Controlled Substance Reporting System reviewed and refill is appropriate on or after 11/10/16. Paper prescription printed & post-dated; Rx left at cancer center front desk for patient to retrieve after showing photo ID per clinic policy.   NCCSRS reviewed:     [name], NP [place] [phone]

## 2016-12-11 ENCOUNTER — Other Ambulatory Visit (HOSPITAL_COMMUNITY): Payer: Self-pay

## 2016-12-11 DIAGNOSIS — C01 Malignant neoplasm of base of tongue: Secondary | ICD-10-CM

## 2016-12-11 MED ORDER — OXYCODONE HCL 20 MG PO TABS: 20.0000 mg | ORAL_TABLET | Freq: Four times a day (QID) | ORAL | 0 refills | Status: DC | PRN

## 2017-01-05 ENCOUNTER — Encounter (HOSPITAL_COMMUNITY): Payer: Self-pay | Admitting: Adult Health

## 2017-01-05 ENCOUNTER — Telehealth (HOSPITAL_COMMUNITY): Payer: Self-pay | Admitting: *Deleted

## 2017-01-05 ENCOUNTER — Other Ambulatory Visit (HOSPITAL_COMMUNITY): Payer: Self-pay | Admitting: Adult Health

## 2017-01-05 DIAGNOSIS — C01 Malignant neoplasm of base of tongue: Secondary | ICD-10-CM

## 2017-01-05 MED ORDER — OXYCODONE HCL 20 MG PO TABS: 20.0000 mg | ORAL_TABLET | Freq: Four times a day (QID) | ORAL | 0 refills | Status: DC | PRN

## 2017-01-05 NOTE — Progress Notes (Signed)
Patient called cancer center requesting refill of Oxycodone.   [place] Controlled Substance Reporting System reviewed and refill is appropriate on or after 01/10/17. Paper prescription printed & post-dated; Rx left at cancer center front desk for patient to retrieve after showing photo ID per clinic policy.   NCCSRS reviewed:     [name], NP [place] [phone]

## 2017-01-05 NOTE — Telephone Encounter (Signed)
Cannot fill until 01/10/17. Rx printed and post-dated.   gwd

## 2017-02-07 ENCOUNTER — Other Ambulatory Visit (HOSPITAL_COMMUNITY): Payer: Self-pay | Admitting: Adult Health

## 2017-02-07 ENCOUNTER — Encounter (HOSPITAL_COMMUNITY): Payer: Self-pay | Admitting: Adult Health

## 2017-02-07 DIAGNOSIS — C01 Malignant neoplasm of base of tongue: Secondary | ICD-10-CM

## 2017-02-07 MED ORDER — OXYCODONE HCL 20 MG PO TABS: 20.0000 mg | ORAL_TABLET | Freq: Four times a day (QID) | ORAL | 0 refills | Status: DC | PRN

## 2017-02-07 NOTE — Progress Notes (Signed)
Patient called cancer center requesting refill of Oxycodone.   [place] Controlled Substance Reporting System reviewed and refill is appropriate on or after 02/10/17. Paper prescription printed & post-dated; Rx left at cancer center front desk for patient to retrieve after showing photo ID per clinic policy.   NCCSRS reviewed:     [name], NP [place] [phone]

## 2017-03-09 ENCOUNTER — Telehealth (HOSPITAL_COMMUNITY): Payer: Self-pay | Admitting: *Deleted

## 2017-03-09 ENCOUNTER — Other Ambulatory Visit (HOSPITAL_COMMUNITY): Payer: Self-pay | Admitting: Oncology

## 2017-03-09 DIAGNOSIS — C01 Malignant neoplasm of base of tongue: Secondary | ICD-10-CM

## 2017-03-09 MED ORDER — OXYCODONE HCL 20 MG PO TABS: 20.0000 mg | ORAL_TABLET | Freq: Four times a day (QID) | ORAL | 0 refills | Status: DC | PRN

## 2017-03-12 ENCOUNTER — Other Ambulatory Visit (HOSPITAL_COMMUNITY): Payer: Self-pay | Admitting: Emergency Medicine

## 2017-03-12 DIAGNOSIS — C01 Malignant neoplasm of base of tongue: Secondary | ICD-10-CM

## 2017-03-12 MED ORDER — OXYCODONE HCL 20 MG PO TABS: 20.0000 mg | ORAL_TABLET | Freq: Four times a day (QID) | ORAL | 0 refills | Status: DC | PRN

## 2017-04-09 ENCOUNTER — Telehealth (HOSPITAL_COMMUNITY): Payer: Self-pay | Admitting: *Deleted

## 2017-04-10 ENCOUNTER — Encounter (HOSPITAL_COMMUNITY): Payer: Self-pay | Admitting: Adult Health

## 2017-04-10 ENCOUNTER — Other Ambulatory Visit (HOSPITAL_COMMUNITY): Payer: Self-pay | Admitting: Adult Health

## 2017-04-10 DIAGNOSIS — C01 Malignant neoplasm of base of tongue: Secondary | ICD-10-CM

## 2017-04-10 MED ORDER — OXYCODONE HCL 20 MG PO TABS: 20.0000 mg | ORAL_TABLET | Freq: Four times a day (QID) | ORAL | 0 refills | Status: DC | PRN

## 2017-04-10 NOTE — Progress Notes (Signed)
Patient called cancer center requesting refill of Oxycodone.   [place] Controlled Substance Reporting System reviewed and refill is appropriate on or after 04/12/17. Paper prescription printed & post-dated; Rx left at cancer center front desk for patient to retrieve after showing photo ID per clinic policy.   NCCSRS reviewed:     [name], NP [place] [phone]

## 2017-04-10 NOTE — Telephone Encounter (Signed)
Rx printed  gwd

## 2017-05-10 ENCOUNTER — Encounter (HOSPITAL_COMMUNITY): Payer: Self-pay | Admitting: Adult Health

## 2017-05-10 ENCOUNTER — Telehealth (HOSPITAL_COMMUNITY): Payer: Self-pay | Admitting: *Deleted

## 2017-05-10 ENCOUNTER — Other Ambulatory Visit (HOSPITAL_COMMUNITY): Payer: Self-pay | Admitting: Adult Health

## 2017-05-10 DIAGNOSIS — C01 Malignant neoplasm of base of tongue: Secondary | ICD-10-CM

## 2017-05-10 MED ORDER — OXYCODONE HCL 20 MG PO TABS: 20.0000 mg | ORAL_TABLET | Freq: Four times a day (QID) | ORAL | 0 refills | Status: DC | PRN

## 2017-05-10 NOTE — Progress Notes (Signed)
Patient called cancer center requesting refill of Oxycodone.   [place] Controlled Substance Reporting System reviewed and refill is appropriate on or after 05/12/17. Medication e-scribed to his pharmacy [facility]) using [employer]'s 2-step verification process.    NCCSRS reviewed:     [name], NP [place] [phone]

## 2017-05-10 NOTE — Telephone Encounter (Signed)
Refill for oxycodone sent to [facility].   gwd

## 2017-06-07 ENCOUNTER — Telehealth (HOSPITAL_COMMUNITY): Payer: Self-pay | Admitting: *Deleted

## 2017-06-08 ENCOUNTER — Other Ambulatory Visit (HOSPITAL_COMMUNITY): Payer: Self-pay | Admitting: Adult Health

## 2017-06-08 ENCOUNTER — Encounter (HOSPITAL_COMMUNITY): Payer: Self-pay | Admitting: Adult Health

## 2017-06-08 DIAGNOSIS — C01 Malignant neoplasm of base of tongue: Secondary | ICD-10-CM

## 2017-06-08 MED ORDER — OXYCODONE HCL 20 MG PO TABS: 20.0000 mg | ORAL_TABLET | Freq: Four times a day (QID) | ORAL | 0 refills | Status: DC | PRN

## 2017-06-08 NOTE — Telephone Encounter (Signed)
Rx sent to pharmacy.   gwd

## 2017-06-08 NOTE — Progress Notes (Signed)
Patient called cancer center requesting refill of Oxycodone.   [place] Controlled Substance Reporting System reviewed and refill is appropriate on or after 06/11/17. Medication e-scribed to his pharmacy [facility]) using [employer]'s 2-step verification process.    NCCSRS reviewed:     [name], NP [place] [phone]

## 2017-07-09 ENCOUNTER — Telehealth (HOSPITAL_COMMUNITY): Payer: Self-pay | Admitting: *Deleted

## 2017-07-10 ENCOUNTER — Other Ambulatory Visit (HOSPITAL_COMMUNITY): Payer: Self-pay | Admitting: Adult Health

## 2017-07-10 ENCOUNTER — Encounter (HOSPITAL_COMMUNITY): Payer: Self-pay | Admitting: Adult Health

## 2017-07-10 DIAGNOSIS — C01 Malignant neoplasm of base of tongue: Secondary | ICD-10-CM

## 2017-07-10 MED ORDER — OXYCODONE HCL 20 MG PO TABS: 20.0000 mg | ORAL_TABLET | Freq: Four times a day (QID) | ORAL | 0 refills | Status: DC | PRN

## 2017-07-10 NOTE — Telephone Encounter (Signed)
Rx sent to his pharmacy.   gwd

## 2017-07-10 NOTE — Progress Notes (Signed)
Patient called cancer center requesting refill of Oxycodone.   [place] Controlled Substance Reporting System reviewed and refill is appropriate on or after 07/11/17. Medication e-scribed to his pharmacy [facility]) using [employer]'s 2-step verification process.    NCCSRS reviewed:     [name], NP [place] [phone]

## 2017-08-10 ENCOUNTER — Other Ambulatory Visit (HOSPITAL_COMMUNITY): Payer: Self-pay

## 2017-08-10 ENCOUNTER — Inpatient Hospital Stay (HOSPITAL_COMMUNITY): Payer: Medicare Other | Attending: Internal Medicine

## 2017-08-10 DIAGNOSIS — Z800 Family history of malignant neoplasm of digestive organs: Secondary | ICD-10-CM | POA: Insufficient documentation

## 2017-08-10 DIAGNOSIS — R1319 Other dysphagia: Secondary | ICD-10-CM | POA: Diagnosis not present

## 2017-08-10 DIAGNOSIS — Z923 Personal history of irradiation: Secondary | ICD-10-CM | POA: Insufficient documentation

## 2017-08-10 DIAGNOSIS — E039 Hypothyroidism, unspecified: Secondary | ICD-10-CM | POA: Insufficient documentation

## 2017-08-10 DIAGNOSIS — C109 Malignant neoplasm of oropharynx, unspecified: Secondary | ICD-10-CM

## 2017-08-10 DIAGNOSIS — Z9221 Personal history of antineoplastic chemotherapy: Secondary | ICD-10-CM | POA: Insufficient documentation

## 2017-08-10 DIAGNOSIS — R52 Pain, unspecified: Secondary | ICD-10-CM | POA: Insufficient documentation

## 2017-08-10 LAB — CBC WITH DIFFERENTIAL/PLATELET
BASOS ABS: 0 10*3/uL (ref 0.0–0.1)
Basophils Relative: 0 %
EOS ABS: 0.2 10*3/uL (ref 0.0–0.7)
EOS PCT: 3 %
HCT: 41.7 % (ref 39.0–52.0)
HEMOGLOBIN: 13.7 g/dL (ref 13.0–17.0)
LYMPHS ABS: 0.9 10*3/uL (ref 0.7–4.0)
LYMPHS PCT: 17 %
MCH: 30.6 pg (ref 26.0–34.0)
MCHC: 32.9 g/dL (ref 30.0–36.0)
MCV: 93.1 fL (ref 78.0–100.0)
Monocytes Absolute: 0.6 10*3/uL (ref 0.1–1.0)
Monocytes Relative: 12 %
NEUTROS PCT: 68 %
Neutro Abs: 3.5 10*3/uL (ref 1.7–7.7)
PLATELETS: 201 10*3/uL (ref 150–400)
RBC: 4.48 MIL/uL (ref 4.22–5.81)
RDW: 14.4 % (ref 11.5–15.5)
WBC: 5.1 10*3/uL (ref 4.0–10.5)

## 2017-08-10 LAB — COMPREHENSIVE METABOLIC PANEL
ALT: 16 U/L — ABNORMAL LOW (ref 17–63)
AST: 19 U/L (ref 15–41)
Albumin: 4.1 g/dL (ref 3.5–5.0)
Alkaline Phosphatase: 49 U/L (ref 38–126)
Anion gap: 8 (ref 5–15)
BILIRUBIN TOTAL: 0.6 mg/dL (ref 0.3–1.2)
BUN: 29 mg/dL — AB (ref 6–20)
CHLORIDE: 102 mmol/L (ref 101–111)
CO2: 29 mmol/L (ref 22–32)
Calcium: 9.5 mg/dL (ref 8.9–10.3)
Creatinine, Ser: 2.19 mg/dL — ABNORMAL HIGH (ref 0.61–1.24)
GFR, EST AFRICAN AMERICAN: 34 mL/min — AB (ref >60)
GFR, EST NON AFRICAN AMERICAN: 29 mL/min — AB (ref >60)
Glucose, Bld: 105 mg/dL — ABNORMAL HIGH (ref 65–99)
POTASSIUM: 4.9 mmol/L (ref 3.5–5.1)
Sodium: 139 mmol/L (ref 135–145)
TOTAL PROTEIN: 7.4 g/dL (ref 6.5–8.1)

## 2017-08-10 LAB — TSH: TSH: 8.82 u[IU]/mL — AB (ref 0.350–4.500)

## 2017-08-10 NOTE — Progress Notes (Signed)
Patient came by the cancer center requesting refill on pain medication. Reviewed with NP and Dr. Raliegh Ip. Since patient has not been seen since 05/2016 and did not come to his last appt in July 2018, he has to be seen before he can get a refill. Explained this to patient. Appt made with Dr. Walden Field for next week. Patient verbalized understanding and went ahead and had his labs performed today in preparation for his follow up.

## 2017-08-14 ENCOUNTER — Other Ambulatory Visit: Payer: Self-pay

## 2017-08-14 ENCOUNTER — Encounter (HOSPITAL_COMMUNITY): Payer: Self-pay | Admitting: Internal Medicine

## 2017-08-14 ENCOUNTER — Inpatient Hospital Stay (HOSPITAL_BASED_OUTPATIENT_CLINIC_OR_DEPARTMENT_OTHER): Payer: Medicare Other | Admitting: Internal Medicine

## 2017-08-14 VITALS — BP 118/98 | HR 69 | Temp 98.3°F | Resp 18 | Wt 139.2 lb

## 2017-08-14 DIAGNOSIS — Z9221 Personal history of antineoplastic chemotherapy: Secondary | ICD-10-CM | POA: Diagnosis not present

## 2017-08-14 DIAGNOSIS — Z800 Family history of malignant neoplasm of digestive organs: Secondary | ICD-10-CM | POA: Diagnosis not present

## 2017-08-14 DIAGNOSIS — Z923 Personal history of irradiation: Secondary | ICD-10-CM

## 2017-08-14 DIAGNOSIS — C109 Malignant neoplasm of oropharynx, unspecified: Secondary | ICD-10-CM

## 2017-08-14 DIAGNOSIS — R52 Pain, unspecified: Secondary | ICD-10-CM

## 2017-08-14 DIAGNOSIS — E039 Hypothyroidism, unspecified: Secondary | ICD-10-CM

## 2017-08-14 DIAGNOSIS — R1319 Other dysphagia: Secondary | ICD-10-CM | POA: Diagnosis not present

## 2017-08-14 NOTE — Progress Notes (Signed)
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  Oropharyngeal cancer.  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 S/P curative concomitant chemo/XRT with remission on PET imaging in October 2016. He was last seen at [facility] in 05/2016 and has failed to keep follow-up appointments.  He called in requesting pain medications.  Previously, he was instructed he would no longer be prescribed opiods by PA Kefalas.  I have discussed with him today if he is having symptoms, he will under PET scan for follow-up imaging.  He should also follow-up with ENT.  Pending PET results if negative, he will be seen for follow-up in 6 months with labs.    2.  Dysphagia.  He will be set up for PET scan for interval evaluation.  He is referred to GI for evaluation.    3.  Pain.  He is referred to pain clinic for evaluation and management.    4.  RI.  Cr is noted to be 2.19 on labs done 08/10/2017.  He is referred to nephrology.    5.  Hypothyroidism.  TSH noted to be elevated at 8.8 on labs done 08/10/2017.  Will refer to endocrinology or PCP.    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 S/P curative concomitant chemo/XRT with remission on PET imaging in October 2016.   Current Status:  Pt is seen today for follow-up.  He is complaining of dysphagia.  He is also requesting pain medication.       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      07/28/2014 Initial Diagnosis    Oropharyngeal cancer      08/03/2014 Imaging    CT CAP- L supraclavicular lymphadenopathy is not completely visualized. This is better seen on the previous neck CT from 07/27/2014. Otherwise, no evidence for metastatic disease in the chest, abdomen, or pelvis.      08/03/2014 Imaging    Bone scan- Uptake at adjacent anterior LEFT 6, 7, 8 ribs likely representing trauma/fractures. Questionable nonspecific increased tracer localization at the posterior RIGHT 8th and 9th ribs, the adjacent nature which raises a a question of trauma as well      08/06/2014 Pathology Results    Dr. Benjamine Mola- Oropharynx, biopsy, Left - INVASIVE SQUAMOUS CELL CARCINOMA.      08/12/2014 Procedure    Dr. Enrique Sack- 1. Multiple extraction of tooth numbers 6, 17, 22, 23, 24, 25, 26, and 27. 3 Quadrants of alveoloplasty      08/17/2014 Pathology Results    PORT and G-TUBE placed by Dr. Carlis Stable.      08/26/2014 PET scan    Large hypermetabolic mass in the left base of tongue. Activity extends across midline to the right base tongue. 2. Intensely hypermetabolic left cervical metastatic lymph nodes. Lymph nodes extend from the left level II position to the left supraclavi      09/01/2014 - 09/22/2014 Chemotherapy    Concurrent chemoradiation with Cisplatin 100 mg/m2  x 2 cycles with Neulasta support. Held cycle #3 d/t renal toxicity.       09/03/2014 - 10/23/2014 Radiation Therapy    Treated in [place], IMRT Isidore Moos).  Base of tongue and bilat neck. Total dose: 70 Gy in 35 fractions. (of note, he did miss several treatments requiring BID dosing towards the end of treatment).       01/25/2015 PET scan    Near complete resolution of metabolic activity at the base of tongue. Minimal residual activity is likely post treatment effect. 2. Complete resolution of metabolic activity above LEFT cervical lymph nodes. No evidence of  residual metabolically active       07/17/2016 Procedure    Port-a-cath removed [name])         Problem List Patient Active Problem List   Diagnosis Date Noted  . 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 3 weeks ago.- Dr. Oneal Deputy center [place], [place].  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 COLONOSCOPY N/A 03/13/2016   Procedure: COLONOSCOPY;  Surgeon: Danie Binder, MD;  [facility];  Service: Endoscopy;  Laterality: N/A;  2:15 PM  . ESOPHAGOGASTRODUODENOSCOPY (EGD) WITH PROPOFOL N/A 08/17/2014   Procedure: ESOPHAGOGASTRODUODENOSCOPY (EGD) WITH PROPOFOL (procedure #1);  Surgeon: Aviva Signs Md, MD;  Location: [facility];  Service: General;  Laterality: N/A;  . MULTIPLE EXTRACTIONS WITH ALVEOLOPLASTY N/A 08/12/2014   Procedure: Extraction of tooth #'s 6,17,22,23,24,25,26,27 with alveoloplasty;  Surgeon: Lenn Cal, DDS;  Location[facility];  Service: Oral Surgery;  Laterality: N/A;  . PANENDOSCOPY N/A 08/06/2014   Procedure: PANENDOSCOPY WITH BIOPSY;  Surgeon: Leta Baptist, MD;  Location: [place];  Service: ENT;  Laterality: N/A;  . PEG PLACEMENT Left 08/17/14  . PEG PLACEMENT N/A 08/17/2014   Procedure: PERCUTANEOUS ENDOSCOPIC GASTROSTOMY (PEG) PLACEMENT (procedure #1);  Surgeon: Aviva Signs Md, MD;  Location[facility];  Service: General;  Laterality: N/A;  . PORT-A-CATH REMOVAL Right 07/17/2016   Procedure: MINOR REMOVAL PORT-A-CATH;  Surgeon: Aviva Signs, MD;  Location: [facility];  Service: General;  Laterality: Right;  . PORTACATH PLACEMENT Right 08/17/14  . PORTACATH PLACEMENT Right 08/17/2014   Procedure: INSERTION PORT-A-CATH  (procedure #2);  Surgeon: Aviva Signs Md, MD;  Location: [facility];  Service: General;  Laterality: Right;    Family History History reviewed. No pertinent family history.   Social History  reports that he quit smoking about 3 years ago. He has a 15.00 pack-year smoking history. He has never used smokeless tobacco. He reports that he does not drink alcohol or use drugs.  Medications  Current Outpatient Medications:  .  Diphenhyd-Hydrocort-Nystatin (FIRST-DUKES MOUTHWASH) SUSP, Use as directed 5 mLs in the mouth or throat 4 (four) times daily as needed., Disp: 300 mL, Rfl: 0 .  Oxycodone HCl 20 MG TABS, Take 1 tablet (20 mg total) by mouth 4 (four) times daily as needed (throat pain)., Disp: 120 tablet, Rfl: 0  Allergies Patient has no known allergies.  Review of Systems Review of Systems - Oncology ROS as per HPI otherwise 12 point ROS is negative.   Physical Exam  Vitals Wt Readings from Last 3 Encounters:  08/14/17 139 lb 3.2 oz (63.1 kg)  07/04/16 154 lb (69.9 kg)  06/09/16 155 lb 12.8 oz (70.7 kg)   Temp Readings from Last 3 Encounters:  08/14/17 98.3 F (36.8 C) (Oral)  07/17/16 98 F (36.7 C) (Oral)  07/04/16 98.4 F (36.9 C)   BP Readings from Last 3 Encounters:  08/14/17 (!) 118/98  07/17/16 128/72  07/04/16 139/68   Pulse Readings from Last 3 Encounters:  08/14/17 69  07/17/16 66  07/04/16 (!) 57    Constitutional: Well-developed, well-nourished, and in no distress.   HENT: Head: Normocephalic and atraumatic.  Mouth/Throat: No oropharyngeal exudate. Mucosa moist. Eyes: Pupils are equal, round, and reactive to light. Conjunctivae are normal. No scleral icterus.  Neck: Normal range of motion. Neck supple. No JVD present.  Cardiovascular: Normal rate, regular rhythm and normal heart sounds.  Exam reveals no gallop and no friction rub.   No murmur heard. Pulmonary/Chest: Effort normal and breath sounds normal. No respiratory distress. No wheezes.No rales.   Abdominal: Soft. Bowel sounds are normal. No distension. There is no tenderness. There is no guarding.  Musculoskeletal: No edema or tenderness.  Lymphadenopathy: No cervical, axillary or supraclavicular adenopathy.  Neurological: Alert and oriented to person, place, and time. No cranial nerve deficit.  Skin: Skin is warm and dry. No rash noted. No erythema. No pallor.  Psychiatric: Affect and judgment normal.   Labs No visits with results within 3 Day(s) from this visit.  Latest known visit with results is:  Orders Only on 08/10/2017  Component Date Value Ref Range Status  . TSH 08/10/2017 8.820* 0.350 - 4.500 uIU/mL Final   Comment: Performed by a 3rd Generation assay with a functional sensitivity of <=0.01 uIU/mL. Performed at [facility], [address], [place], [place] [address]   . Sodium 08/10/2017 139  135 - 145 mmol/L Final  . Potassium 08/10/2017 4.9  3.5 - 5.1 mmol/L Final  . Chloride 08/10/2017 102  101 - 111 mmol/L Final  . CO2 08/10/2017 29  22 - 32 mmol/L Final  . Glucose, Bld 08/10/2017 105* 65 - 99 mg/dL Final  . BUN 08/10/2017 29* 6 - 20 mg/dL Final  . Creatinine, Ser 08/10/2017 2.19* 0.61 - 1.24 mg/dL Final  . Calcium 08/10/2017 9.5  8.9 - 10.3 mg/dL Final  . Total Protein 08/10/2017 7.4  6.5 - 8.1 g/dL Final  . Albumin 08/10/2017 4.1  3.5 - 5.0 g/dL Final  . AST 08/10/2017 19  15 - 41 U/L Final  . ALT 08/10/2017 16* 17 - 63 U/L Final  . Alkaline Phosphatase 08/10/2017 49  38 - 126 U/L Final  . Total Bilirubin 08/10/2017 0.6  0.3 - 1.2 mg/dL Final  . GFR calc non Af Amer 08/10/2017 29* >60 mL/min Final  . GFR calc Af Amer 08/10/2017 34* >60 mL/min Final   Comment: (NOTE) The eGFR has been calculated using the CKD EPI equation. This calculation has not been validated in all clinical situations. eGFR's persistently <60 mL/min signify possible Chronic Kidney Disease.   Georgiann Hahn gap 08/10/2017 8  5 - 15 Final   Performed at [facility], [address]  [place], [place] [address]  . WBC 08/10/2017 5.1  4.0 - 10.5 K/uL Final  . RBC 08/10/2017 4.48  4.22 - 5.81 MIL/uL Final  . Hemoglobin 08/10/2017 13.7  13.0 - 17.0 g/dL Final  . HCT 08/10/2017 41.7  39.0 - 52.0 % Final  . MCV 08/10/2017 93.1  78.0 - 100.0 fL Final  .  MCH 08/10/2017 30.6  26.0 - 34.0 pg Final  . MCHC 08/10/2017 32.9  30.0 - 36.0 g/dL Final  . RDW 08/10/2017 14.4  11.5 - 15.5 % Final  . Platelets 08/10/2017 201  150 - 400 K/uL Final  . Neutrophils Relative % 08/10/2017 68  % Final  . Neutro Abs 08/10/2017 3.5  1.7 - 7.7 K/uL Final  . Lymphocytes Relative 08/10/2017 17  % Final  . Lymphs Abs 08/10/2017 0.9  0.7 - 4.0 K/uL Final  . Monocytes Relative 08/10/2017 12  % Final  . Monocytes Absolute 08/10/2017 0.6  0.1 - 1.0 K/uL Final  . Eosinophils Relative 08/10/2017 3  % Final  . Eosinophils Absolute 08/10/2017 0.2  0.0 - 0.7 K/uL Final  . Basophils Relative 08/10/2017 0  % Final  . Basophils Absolute 08/10/2017 0.0  0.0 - 0.1 K/uL Final   Performed at [facility], [address] [place], [place] [address]     Pathology Orders Placed This Encounter  Procedures  . NM PET Image Restag (PS) Skull Base To Thigh    Standing Status:   Future    Standing Expiration Date:   08/14/2018    Order Specific Question:   If indicated for the ordered procedure, I authorize the administration of a radiopharmaceutical per Radiology protocol    Answer:   Yes    Order Specific Question:   Preferred imaging location?    Answer:   [facility]    Order Specific Question:   Radiology Contrast Protocol - do NOT remove file path    Answer:   [email or web address]  . CBC with Differential/Platelet    Standing Status:   Future    Standing Expiration Date:   08/15/2018  . Comprehensive metabolic panel    Standing Status:   Future    Standing Expiration Date:   08/15/2018  . Lactate dehydrogenase    Standing Status:   Future    Standing Expiration Date:   08/15/2018        Zoila Shutter MD

## 2017-08-14 NOTE — Patient Instructions (Signed)
[facility] at [facility] Discharge Instructions  Today you saw Dr. Higgs.    Thank you for choosing [facility] at [facility] to provide your oncology and hematology care.  To afford each patient quality time with our provider, please arrive at least 15 minutes before your scheduled appointment time.   If you have a lab appointment with the Cancer Center please come in thru the  Main Entrance and check in at the main information desk  You need to re-schedule your appointment should you arrive 10 or more minutes late.  We strive to give you quality time with our providers, and arriving late affects you and other patients whose appointments are after yours.  Also, if you no show three or more times for appointments you may be dismissed from the clinic at the providers discretion.     Again, thank you for choosing [facility].  Our hope is that these requests will decrease the amount of time that you wait before being seen by our physicians.       _____________________________________________________________  Should you have questions after your visit to [facility], please contact our office at [phone] between the hours of 8:30 a.m. and 4:30 p.m.  Voicemails left after 4:30 p.m. will not be returned until the following business day.  For prescription refill requests, have your pharmacy contact our office.       Resources For Cancer Patients and their Caregivers ? American Cancer Society: Can assist with transportation, wigs, general needs, runs Look Good Feel Better.        1-888-227-6333 ? Cancer Care: Provides financial assistance, online support groups, medication/co-pay assistance.  1-800-813-HOPE (4673) ? [facility] Assists [place] cancer patients and their families through emotional , educational and financial support.  336-427-4357 ? [place] DSS Where to apply for  food stamps, Medicaid and utility assistance. 336-342-1394 ? [facility]: Transportation to medical appointments. 336-347-2287 ? Social Security Administration: May apply for disability if have a Stage IV cancer. 336-342-7796 1-800-772-1213 ? [place] Aging, Disability and Transit Services: Assists with nutrition, care and transit needs. 336-349-2343  Cancer Center Support Programs:   > Cancer Support Group  2nd Tuesday of the month 1pm-2pm, Journey Room   > Creative Journey  3rd Tuesday of the month 1130am-1pm, Journey Room    

## 2017-08-15 ENCOUNTER — Encounter: Payer: Self-pay | Admitting: Gastroenterology

## 2017-09-03 ENCOUNTER — Telehealth (HOSPITAL_COMMUNITY): Payer: Self-pay | Admitting: Internal Medicine

## 2017-09-03 ENCOUNTER — Other Ambulatory Visit (HOSPITAL_COMMUNITY): Payer: Self-pay | Admitting: Internal Medicine

## 2017-09-03 ENCOUNTER — Encounter (HOSPITAL_COMMUNITY): Admission: RE | Admit: 2017-09-03 | Payer: Medicare Other | Source: Ambulatory Visit

## 2017-09-03 DIAGNOSIS — R634 Abnormal weight loss: Secondary | ICD-10-CM

## 2017-09-03 DIAGNOSIS — C109 Malignant neoplasm of oropharynx, unspecified: Secondary | ICD-10-CM

## 2017-09-03 NOTE — Progress Notes (Unsigned)
t

## 2017-09-04 ENCOUNTER — Telehealth (HOSPITAL_COMMUNITY): Payer: Self-pay | Admitting: Internal Medicine

## 2017-09-17 ENCOUNTER — Other Ambulatory Visit (HOSPITAL_COMMUNITY): Payer: Medicare Other

## 2017-09-20 ENCOUNTER — Ambulatory Visit (HOSPITAL_COMMUNITY)
Admission: RE | Admit: 2017-09-20 | Discharge: 2017-09-20 | Disposition: A | Discharge status: Home / Self Care | Payer: Medicare Other | Source: Ambulatory Visit | Attending: Internal Medicine | Admitting: Internal Medicine

## 2017-09-20 DIAGNOSIS — K7689 Other specified diseases of liver: Secondary | ICD-10-CM | POA: Insufficient documentation

## 2017-09-20 DIAGNOSIS — C109 Malignant neoplasm of oropharynx, unspecified: Secondary | ICD-10-CM

## 2017-09-20 DIAGNOSIS — R1310 Dysphagia, unspecified: Secondary | ICD-10-CM | POA: Diagnosis not present

## 2017-09-20 DIAGNOSIS — R634 Abnormal weight loss: Secondary | ICD-10-CM | POA: Diagnosis not present

## 2017-09-20 DIAGNOSIS — R918 Other nonspecific abnormal finding of lung field: Secondary | ICD-10-CM | POA: Diagnosis not present

## 2017-09-20 DIAGNOSIS — R590 Localized enlarged lymph nodes: Secondary | ICD-10-CM | POA: Diagnosis not present

## 2017-09-20 LAB — POCT I-STAT CREATININE: Creatinine, Ser: 2 mg/dL — ABNORMAL HIGH (ref 0.61–1.24)

## 2017-09-20 MED ORDER — IOPAMIDOL (ISOVUE-300) INJECTION 61%
80.0000 mL | Freq: Once | INTRAVENOUS | Status: AC | PRN
  Administered 2017-09-20: 80 mL via INTRAVENOUS

## 2017-09-24 ENCOUNTER — Ambulatory Visit (INDEPENDENT_AMBULATORY_CARE_PROVIDER_SITE_OTHER): Payer: Medicare Other | Admitting: Otolaryngology

## 2017-09-24 DIAGNOSIS — Z85818 Personal history of malignant neoplasm of other sites of lip, oral cavity, and pharynx: Secondary | ICD-10-CM

## 2017-09-24 DIAGNOSIS — R070 Pain in throat: Secondary | ICD-10-CM

## 2017-10-16 ENCOUNTER — Encounter: Payer: Self-pay | Admitting: Nurse Practitioner

## 2017-11-05 ENCOUNTER — Ambulatory Visit: Payer: Medicare Other | Admitting: Nurse Practitioner

## 2017-11-07 ENCOUNTER — Ambulatory Visit: Payer: Medicare Other | Admitting: Nurse Practitioner

## 2017-11-09 ENCOUNTER — Ambulatory Visit (INDEPENDENT_AMBULATORY_CARE_PROVIDER_SITE_OTHER): Payer: Medicare Other | Admitting: Nurse Practitioner

## 2017-11-09 ENCOUNTER — Encounter: Payer: Self-pay | Admitting: Nurse Practitioner

## 2017-11-09 DIAGNOSIS — R1310 Dysphagia, unspecified: Secondary | ICD-10-CM | POA: Diagnosis not present

## 2017-11-09 DIAGNOSIS — R1319 Other dysphagia: Secondary | ICD-10-CM

## 2017-11-09 DIAGNOSIS — Z85819 Personal history of malignant neoplasm of unspecified site of lip, oral cavity, and pharynx: Secondary | ICD-10-CM

## 2017-11-09 NOTE — Assessment & Plan Note (Signed)
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  In the setting of his chronic dysphasia with recent worsening there could be radiation-induced esophageal damage responsible for his symptoms.  We will further evaluate as per above.  Recommend follow-up with oncology as they recommend.

## 2017-11-09 NOTE — Patient Instructions (Addendum)
1. We will schedule your upper endoscopy for you. 2. I am giving you further information related to a soft diet to help prevent swallowing problems. 3. Try to stop taking Advil.  This could be worsening your symptoms.  I recommend trying Tylenol instead. 4. Return for follow-up in 3 months. 5. Call us if you have any questions or concerns.  At [facility] we value your feedback. You may receive a survey about your visit today. Please share your experience as we strive to create trusting relationships with our patients to provide genuine, compassionate, quality care.  It was great to meet you today!  I hope you have a great summer!!     Dysphagia Diet Level 3, Mechanically Advanced The dysphagia level 3 diet includes foods that are soft, moist, and can be chopped into 1-inch chunks. This diet is helpful for people with mild swallowing difficulties. It reduces the risk of food getting caught in the windpipe, trachea, or lungs. What do I need to know about this diet?  You may eat foods that are soft and moist.  If you were on the dysphagia level 1 or level 2 diets, you may eat any of the foods included on those lists.  Avoid foods that are dry, hard, sticky, chewy, coarse, and crunchy. Also avoid large cuts of food.  Take small bites. Each bite should contain 1 inch or less of food.  Thicken liquids if instructed by your health care provider. Follow your health care provider's instructions on how to do this and to what consistency.  See your dietitian or speech language pathologist regularly for help with your dietary changes. What foods can I eat? Grains Moist breads without nuts or seeds. Biscuits, muffins, pancakes, and waffles well-moistened with syrup, jelly, margarine, or butter. Smooth cereals with plenty of milk to moisten them. Moist bread stuffing. Moist rice. Vegetables All cooked, soft vegetables. Shredded lettuce. Tender fried potatoes. Fruits All canned  and cooked fruits. Soft, peeled fresh fruits, such as peaches, nectarines, kiwis, cantaloupe, honeydew melon, and watermelon without seeds. Soft berries, such as strawberries. Meat and Other Protein Sources Moist ground or finely diced or sliced meats. Solid, tender cuts of meat. Meatloaf. Hamburger with a bun. Sausage patty. Deli thin-sliced lunch meat. Chicken, egg, or tuna salad sandwich. Sloppy joe. Moist fish. Eggs prepared any way. Casseroles with small chunks of meats, ground meats, or tender meats. Dairy Cheese spreads without coarse large chunks. Shredded cheese. Cheese slices. Cottage cheese. Milk at the right texture. Smooth frappes. Yogurt without nuts or coconut. Ask your health care provider whether you can have frozen desserts (such as malts or milk shakes) and thin liquids. Sweets/Desserts Soft, smooth, moist desserts. Non-chewy, smooth candy. Jam. Jelly. Honey. Preserves. Ask your health care provider whether you can have frozen desserts. Fats and Oils Butter. Oils. Margarine. Mayonnaise. Gravy. Spreads. Other All seasonings and sweeteners. All sauces without large chunks. The items listed above may not be a complete list of recommended foods or beverages. Contact your dietitian for more options. What foods are not recommended? Grains Coarse or dry cereals. Dry breads. Toast. Crackers. Tough, crusty breads, such French bread and baguettes. Tough, crisp fried potatoes. Potato skins. Dry bread stuffing. Granola. Popcorn. Chips. Vegetables All raw vegetables except shredded lettuce. Cooked corn. Rubbery or stiff cooked vegetables. Stringy vegetables, such as celery. Fruits Hard fruits that are difficult to chew, such as apples or pears. Stringy, high-pulp fruits, such as pineapple, papaya, or mango. Fruits with tough skins, such  as grapes. Coconut. All dried fruits. Fruit leather. Fruit roll-ups. Fruit snacks. Meat and Other Protein Sources Dry or tough meats or poultry. Dry fish.  Fish with bones. Peanut butter. All nuts and seeds. Dairy Any with nuts, seeds, chocolate chips, dried fruit, coconut, or pineapple. Sweets/Desserts Dry cakes. Chewy or dry cookies. Any with nuts, seeds, dry fruits, coconut, pineapple, or anything dry, sticky, or hard. Chewy caramel. Licorice. Taffy-type candies. Ask your health care provider whether you can have frozen desserts. Fats and Oils Any with chunks, nuts, seeds, or pineapple. Olives. Angie Fava. Other Soups with tough or large chunks of meats, poultry, or vegetables. Corn or clam chowder. The items listed above may not be a complete list of foods and beverages to avoid. Contact your dietitian for more information. This information is not intended to replace advice given to you by your health care provider. Make sure you discuss any questions you have with your health care provider. Document Released: 03/13/2005 Document Revised: 08/19/2015 Document Reviewed: 02/24/2013 Elsevier Interactive Patient Education  [employer].

## 2017-11-09 NOTE — Progress Notes (Addendum)
REVIEWED. Recent CT CHEST shows bulky mediastinal lymphadenopathy.  Referring Provider: Manon Hilding, MD Primary Care Physician:  Manon Hilding, MD Primary GI:  Dr. Oneida Alar  Chief Complaint  Patient presents with  . Dysphagia    food and pills    HPI:   Alexander [name] is a 67 y.o. male who presents for evaluation of dysphasia.  Patient was last seen by our service for screening colonoscopy on 03/13/2016.  Colonoscopy findings included redundant sigmoid and rectosigmoid colon, internal hemorrhoids which were small.  Recommended repeat colonoscopy in 10 years.  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  The patient was last seen by oncology 08/14/2017 at which point they noted dysphasia and planned a nuc med PET scan, CMP, lactate dehydrogenase.  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  His PET scan is ordered but not yet completed.  However, CT of the soft tissues/neck, chest, and abdomen/pelvis were completed.  CT of the soft tissues of the neck found limited assessment of the pharynx due to motion, decreased size of low-density left level 2 lymph node with no evidence of new or progressive cervical lymphadenopathy.  CT of the chest and abdomen/pelvis found interval development of bulky mediastinal adenopathy concerning for metastatic disease, interval development of pulmonary nodules within the left lower lobe concerning for possible pulmonary metastatic disease, no definite metastatic disease within the abdomen or pelvis, multiple hepatic cysts.  Labs were ordered but not yet completed.  Today he states he's had chronic dysphagia. Noted worsening when his pain medications were discontinued (about 3 months ago). Solid food dysphagia every meal. Pill dysphagia with every medication,  worse with larger pills. Takes Aleve before meals (3-4 times a day); no other NSAIDs or ASA powders. With dysphagia, food/pills eventually pass with copious fluids. Only has GERD if he eats trigger foods (ie- spaghetti sauce). Denies abdominal pain, N/V, hematochezia, melena, fever, chills. Notes 5 lb unintentional weight loss in the past 3 months ("since they stopped my pain medication"). Denies chest pain, dyspnea, dizziness, lightheadedness, syncope, near syncope. Denies any other upper or lower GI symptoms.  Past Medical History:  Diagnosis Date  . 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 Oropharyngeal cancer ([place]) 07/28/2014   dx. 3 weeks ago.- Dr. [facility]al [facility] [place], [place].  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 COLONOSCOPY N/A 03/13/2016   Procedure: COLONOSCOPY;  Surgeon: Danie Binder, MD;  Location: [facility];  Service: Endoscopy;  Laterality: N/A;  2:15 PM  . ESOPHAGOGASTRODUODENOSCOPY (EGD) WITH PROPOFOL N/A 08/17/2014   Procedure: ESOPHAGOGASTRODUODENOSCOPY (EGD) WITH PROPOFOL (procedure #1);  Surgeon: Aviva Signs Md, MD;  Location: [facility];  Service: General;  Laterality: N/A;  . MULTIPLE EXTRACTIONS WITH ALVEOLOPLASTY N/A 08/12/2014   Procedure: Extraction of tooth #'s 6,17,22,23,24,25,26,27 with alveoloplasty;  Surgeon: Lenn Cal, DDS;  Location: [facility];  Service: Oral Surgery;  Laterality: N/A;  . PANENDOSCOPY N/A 08/06/2014   Procedure: PANENDOSCOPY WITH BIOPSY;  Surgeon: Leta Baptist, MD;  Location: [facility];  Service: ENT;  Laterality: N/A;  . PEG PLACEMENT Left 08/17/14  . PEG PLACEMENT N/A 08/17/2014   Procedure:  PERCUTANEOUS ENDOSCOPIC GASTROSTOMY (PEG) PLACEMENT (procedure #1);  Surgeon: Aviva Signs Md, MD;  Location: [facility];  Service: General;  Laterality: N/A;  . PORT-A-CATH REMOVAL Right 07/17/2016    Procedure: MINOR REMOVAL PORT-A-CATH;  Surgeon: Aviva Signs, MD;  Location: [facility];  Service: General;  Laterality: Right;  . PORTACATH PLACEMENT Right 08/17/14  . PORTACATH PLACEMENT Right 08/17/2014   Procedure: INSERTION PORT-A-CATH (procedure #2);  Surgeon: Aviva Signs Md, MD;  Location: [facility];  Service: General;  Laterality: Right;    Current Outpatient Medications  Medication Sig Dispense Refill  . Diphenhyd-Hydrocort-Nystatin (FIRST-DUKES MOUTHWASH) SUSP Use as directed 5 mLs in the mouth or throat 4 (four) times daily as needed. 300 mL 0   No current facility-administered medications for this visit.     Allergies as of 11/09/2017  . (No Known Allergies)    Family History  Problem Relation Age of Onset  . Colon cancer Neg Hx   . Gastric cancer Neg Hx   . Esophageal cancer Neg Hx     Social History   Socioeconomic History  . Marital status: Legally Separated    Spouse name: Not on file  . Number of children: 5  . Years of education: Not on file  . Highest education level: Not on file  Occupational History  . Not on file  Social Needs  . Financial resource strain: Not on file  . Food insecurity:    Worry: Not on file    Inability: Not on file  . Transportation needs:    Medical: Not on file    Non-medical: Not on file  Tobacco Use  . Smoking status: Former Smoker    Packs/day: 0.50    Years: 30.00    Pack years: 15.00    Last attempt to quit: 07/22/2014    Years since quitting: 3.3  . Smokeless tobacco: Never Used  Substance and Sexual Activity  . Alcohol use: No    Alcohol/week: 0.0 standard drinks    Comment: None currently (11/09/17); previously 1-2 beers on the weekend  . Drug use: No  . Sexual activity: Not on file  Lifestyle  . Physical activity:    Days per week: Not on file    Minutes per session: Not on file  . Stress: Not on file  Relationships  . Social connections:    Talks on phone: Not on file    Gets together: Not on file    Attends  religious service: Not on file    Active member of club or organization: Not on file    Attends meetings of clubs or organizations: Not on file    Relationship status: Not on file  Other Topics Concern  . Not on file  Social History Narrative  . Not on file    Review of Systems: Complete ROS negative except as per HPI.   Physical Exam: BP 116/68   Pulse 64   Temp 97.7 F (36.5 C) (Oral)   Ht 6\' 1"  (1.854 m)   Wt 135 lb 3.2 oz (61.3 kg)   BMI 17.84 kg/m  General:   Alert and oriented. Pleasant and cooperative. Well-nourished and well-developed.  Eyes:  Without icterus, sclera clear and conjunctiva pink.  Ears:  Normal auditory acuity. Cardiovascular:  S1, S2 present without murmurs appreciated. Extremities without clubbing or edema. Respiratory:  Clear to auscultation bilaterally. No wheezes, rales, or rhonchi. No distress.  Gastrointestinal:  +BS, soft, non-tender and non-distended. No HSM noted. No guarding or rebound. No  masses appreciated.  Rectal:  Deferred  Musculoskalatal:  Symmetrical without gross deformities. Neurologic:  Alert and oriented x4;  grossly normal neurologically. Psych:  Alert and cooperative. Normal mood and affect. Heme/Lymph/Immune: No excessive bruising noted.    11/09/2017 11:31 AM   Disclaimer: This note was dictated with voice recognition software. Similar sounding words can inadvertently be transcribed and may not be corrected upon review.

## 2017-11-09 NOTE — Assessment & Plan Note (Signed)
The patient notes chronic dysphagia that have been well controlled while he was on pain medications by oncology.  He recently saw them and they ordered a CT of the soft tissues of the neck, chest, and abdomen/pelvis.  There are some concerning findings on these exams.  It appears that a PET scan is scheduled but not yet completed.  Given that he has had radiation to his oral pharyngeal cancer there is possibility for radiation-induced injury.  He is having solid food dysphagia with every meal, pill dysphasia.  He is on NSAIDs about 3 times a day.  His could be worsening his symptoms.  At this point I will recommend he stop using NSAIDs, can substitute Tylenol for now.  Dysphasia 3 diet.  We will plan for an upper endoscopy with possible dilation to further evaluate.  Proceed with EGD +/- dilation with Dr. Oneida Alar in near future: the risks, benefits, and alternatives have been discussed with the patient in detail. The patient states understanding and desires to proceed.  The patient has a history of chronic pain medications due to cancer treatment.  Previous social alcohol use.  Currently he is not on any anticoagulants, anxiolytics, chronic pain medications, or antidepressants.  Given no ongoing sedating medicines and his last procedure in 2017 was completed on conscious sedation, we will plan for conscious sedation for his upcoming procedure which should be adequate.

## 2017-11-12 ENCOUNTER — Other Ambulatory Visit: Payer: Self-pay

## 2017-11-12 ENCOUNTER — Telehealth: Payer: Self-pay

## 2017-11-12 DIAGNOSIS — R1310 Dysphagia, unspecified: Secondary | ICD-10-CM

## 2017-11-12 DIAGNOSIS — R1319 Other dysphagia: Secondary | ICD-10-CM

## 2017-11-12 DIAGNOSIS — Z85819 Personal history of malignant neoplasm of unspecified site of lip, oral cavity, and pharynx: Secondary | ICD-10-CM

## 2017-11-12 NOTE — Progress Notes (Signed)
CC'D TO PCP °

## 2017-11-12 NOTE — Telephone Encounter (Signed)
Tried to call pt to inform of pre-op appt 01/08/18 at 11:00am, call went straight to VM, LMOVM. Letter mailed.

## 2018-01-03 NOTE — Patient Instructions (Signed)
20    Your procedure is scheduled on: 01/15/2018  Report to [facility] at     7:00 AM.  Call this number if you have problems the morning of surgery: [phone]   Remember:   Do not drink or eat food:After Midnight.     Do not wear jewelry, make-up or nail polish.  Do not wear lotions, powders, or perfumes. You may wear deodorant.    Do not bring valuables to the hospital.  Contacts, dentures or bridgework may not be worn into surgery.  Leave suitcase in the car. After surgery it may be brought to your room.  For patients admitted to the hospital, checkout time is 11:00 AM the day of discharge.   Patients discharged the day of surgery will not be allowed to drive home.  Name and phone number of your driver:    Please read over the following fact sheets that you were given: Pain Booklet, Lab Information and Anesthesia Post-op Instructions   Endoscopy Care After Please read the instructions outlined below and refer to this sheet in the next few weeks. These discharge instructions provide you with general information on caring for yourself after you leave the hospital. Your doctor may also give you specific instructions. While your treatment has been planned according to the most current medical practices available, unavoidable complications occasionally occur. If you have any problems or questions after discharge, please call your doctor. HOME CARE INSTRUCTIONS Activity  You may resume your regular activity but move at a slower pace for the next 24 hours.   Take frequent rest periods for the next 24 hours.   Walking will help expel (get rid of) the air and reduce the bloated feeling in your abdomen.   No driving for 24 hours (because of the anesthesia (medicine) used during the test).   You may shower.   Do not sign any important legal documents or operate any machinery for 24 hours (because of the anesthesia used during the test).  Nutrition  Drink plenty of fluids.   You  may resume your normal diet.   Begin with a light meal and progress to your normal diet.   Avoid alcoholic beverages for 24 hours or as instructed by your caregiver.  Medications You may resume your normal medications unless your caregiver tells you otherwise. What you can expect today  You may experience abdominal discomfort such as a feeling of fullness or "gas" pains.   You may experience a sore throat for 2 to 3 days. This is normal. Gargling with salt water may help this.  Follow-up Your doctor will discuss the results of your test with you. SEEK IMMEDIATE MEDICAL CARE IF:  You have excessive nausea (feeling sick to your stomach) and/or vomiting.   You have severe abdominal pain and distention (swelling).   You have trouble swallowing.   You have a temperature over 100 F (37.8 C).   You have rectal bleeding or vomiting of blood.  Document Released: 10/26/2003 Document Revised: 03/02/2011 Document Reviewed: 05/08/2007  Esophageal Dilatation Esophageal dilatation is a procedure to open a blocked or narrowed part of the esophagus. The esophagus is the long tube in your throat that carries food and liquid from your mouth to your stomach. The procedure is also called esophageal dilation. You may need this procedure if you have a buildup of scar tissue in your esophagus that makes it difficult, painful, or even impossible to swallow. This can be caused by gastroesophageal reflux disease (GERD). In  rare cases, people need this procedure because they have cancer of the esophagus or a problem with the way food moves through the esophagus. Sometimes you may need to have another dilatation to enlarge the opening of the esophagus gradually. Tell a health care provider about:  Any allergies you have.  All medicines you are taking, including vitamins, herbs, eye drops, creams, and over-the-counter medicines.  Any problems you or family members have had with anesthetic medicines.  Any  blood disorders you have.  Any surgeries you have had.  Any medical conditions you have.  Any antibiotic medicines you are required to take before dental procedures. What are the risks? Generally, this is a safe procedure. However, problems can occur and include:  Bleeding from a tear in the lining of the esophagus.  A hole (perforation) in the esophagus.  What happens before the procedure?  Do not eat or drink anything after midnight on the night before the procedure or as directed by your health care provider.  Ask your health care provider about changing or stopping your regular medicines. This is especially important if you are taking diabetes medicines or blood thinners.  Plan to have someone take you home after the procedure. What happens during the procedure?  You will be given a medicine that makes you relaxed and sleepy (sedative).  A medicine may be sprayed or gargled to numb the back of the throat.  Your health care provider can use various instruments to do an esophageal dilatation. During the procedure, the instrument used will be placed in your mouth and passed down into your esophagus. Options include: ? Simple dilators. This instrument is carefully placed in the esophagus to stretch it. ? Guided wire bougies. In this method, a flexible tube (endoscope) is used to insert a wire into the esophagus. The dilator is passed over this wire to enlarge the esophagus. Then the wire is removed. ? Balloon dilators. An endoscope with a small balloon at the end is passed down into the esophagus. Inflating the balloon gently stretches the esophagus and opens it up. What happens after the procedure?  Your blood pressure, heart rate, breathing rate, and blood oxygen level will be monitored often until the medicines you were given have worn off.  Your throat may feel slightly sore and will probably still feel numb. This will improve slowly over time.  You will not be allowed to eat  or drink until the throat numbness has resolved.  If this is a same-day procedure, you may be allowed to go home once you have been able to drink, urinate, and sit on the edge of the bed without nausea or dizziness.  If this is a same-day procedure, you should have a friend or family member with you for the next 24 hours after the procedure. This information is not intended to replace advice given to you by your health care provider. Make sure you discuss any questions you have with your health care provider. Document Released: 05/04/2005 Document Revised: 08/19/2015 Document Reviewed: 07/23/2013 Elsevier Interactive Patient Education  2018 [place], Care After These instructions provide you with information about caring for yourself after your procedure. Your health care provider may also give you more specific instructions. Your treatment has been planned according to current medical practices, but problems sometimes occur. Call your health care provider if you have any problems or questions after your procedure. What can I expect after the procedure? After your procedure, it is common to:  Feel sleepy for several hours.  Feel clumsy and have poor balance for several hours.  Feel forgetful about what happened after the procedure.  Have poor judgment for several hours.  Feel nauseous or vomit.  Have a sore throat if you had a breathing tube during the procedure.  Follow these instructions at home: For at least 24 hours after the procedure:   Do not: ? Participate in activities in which you could fall or become injured. ? Drive. ? Use heavy machinery. ? Drink alcohol. ? Take sleeping pills or medicines that cause drowsiness. ? Make important decisions or sign legal documents. ? Take care of children on your own.  Rest. Eating and drinking  Follow the diet that is recommended by your health care provider.  If you vomit, drink water, juice,  or soup when you can drink without vomiting.  Make sure you have little or no nausea before eating solid foods. General instructions  Have a responsible adult stay with you until you are awake and alert.  Take over-the-counter and prescription medicines only as told by your health care provider.  If you smoke, do not smoke without supervision.  Keep all follow-up visits as told by your health care provider. This is important. Contact a health care provider if:  You keep feeling nauseous or you keep vomiting.  You feel light-headed.  You develop a rash.  You have a fever. Get help right away if:  You have trouble breathing. This information is not intended to replace advice given to you by your health care provider. Make sure you discuss any questions you have with your health care provider. Document Released: 07/04/2015 Document Revised: 11/03/2015 Document Reviewed: 07/04/2015 Elsevier Interactive Patient Education  [employer].

## 2018-01-08 ENCOUNTER — Encounter (HOSPITAL_COMMUNITY)
Admission: RE | Admit: 2018-01-08 | Discharge: 2018-01-08 | Disposition: A | Discharge status: Home / Self Care | Payer: Medicare Other | Source: Ambulatory Visit | Attending: Gastroenterology | Admitting: Gastroenterology

## 2018-01-08 ENCOUNTER — Other Ambulatory Visit: Payer: Self-pay

## 2018-01-08 ENCOUNTER — Encounter (HOSPITAL_COMMUNITY): Payer: Self-pay

## 2018-01-08 DIAGNOSIS — Z01818 Encounter for other preprocedural examination: Secondary | ICD-10-CM | POA: Diagnosis not present

## 2018-01-08 DIAGNOSIS — I289 Disease of pulmonary vessels, unspecified: Secondary | ICD-10-CM

## 2018-01-08 LAB — BASIC METABOLIC PANEL
ANION GAP: 9 (ref 5–15)
BUN: 20 mg/dL (ref 8–23)
CO2: 25 mmol/L (ref 22–32)
Calcium: 9.1 mg/dL (ref 8.9–10.3)
Chloride: 105 mmol/L (ref 98–111)
Creatinine, Ser: 1.75 mg/dL — ABNORMAL HIGH (ref 0.61–1.24)
GFR calc Af Amer: 44 mL/min — ABNORMAL LOW (ref >60)
GFR calc non Af Amer: 38 mL/min — ABNORMAL LOW (ref >60)
GLUCOSE: 109 mg/dL — AB (ref 70–99)
POTASSIUM: 4.1 mmol/L (ref 3.5–5.1)
Sodium: 139 mmol/L (ref 135–145)

## 2018-01-08 LAB — CBC WITH DIFFERENTIAL/PLATELET
ABS IMMATURE GRANULOCYTES: 0.02 10*3/uL (ref 0.00–0.07)
BASOS PCT: 0 %
Basophils Absolute: 0 10*3/uL (ref 0.0–0.1)
Eosinophils Absolute: 0.1 10*3/uL (ref 0.0–0.5)
Eosinophils Relative: 2 %
HEMATOCRIT: 41.6 % (ref 39.0–52.0)
Hemoglobin: 13.2 g/dL (ref 13.0–17.0)
IMMATURE GRANULOCYTES: 0 %
LYMPHS ABS: 1 10*3/uL (ref 0.7–4.0)
Lymphocytes Relative: 17 %
MCH: 29.3 pg (ref 26.0–34.0)
MCHC: 31.7 g/dL (ref 30.0–36.0)
MCV: 92.4 fL (ref 80.0–100.0)
MONOS PCT: 11 %
Monocytes Absolute: 0.7 10*3/uL (ref 0.1–1.0)
NEUTROS ABS: 4 10*3/uL (ref 1.7–7.7)
NEUTROS PCT: 70 %
PLATELETS: 214 10*3/uL (ref 150–400)
RBC: 4.5 MIL/uL (ref 4.22–5.81)
RDW: 15.2 % (ref 11.5–15.5)
WBC: 5.8 10*3/uL (ref 4.0–10.5)
nRBC: 0 % (ref 0.0–0.2)

## 2018-01-09 NOTE — Progress Notes (Signed)
CC'D TO PCP °

## 2018-01-15 ENCOUNTER — Encounter (HOSPITAL_COMMUNITY)
Admission: RE | Disposition: A | Discharge status: Home / Self Care | Payer: Self-pay | Source: Ambulatory Visit | Attending: Gastroenterology

## 2018-01-15 ENCOUNTER — Ambulatory Visit (HOSPITAL_COMMUNITY)
Admission: RE | Admit: 2018-01-15 | Discharge: 2018-01-15 | Disposition: A | Discharge status: Home / Self Care | Payer: Medicare Other | Source: Ambulatory Visit | Attending: Gastroenterology | Admitting: Gastroenterology

## 2018-01-15 ENCOUNTER — Ambulatory Visit (HOSPITAL_COMMUNITY): Payer: Medicare Other | Admitting: Anesthesiology

## 2018-01-15 ENCOUNTER — Encounter (HOSPITAL_COMMUNITY): Payer: Self-pay | Admitting: Gastroenterology

## 2018-01-15 DIAGNOSIS — K2970 Gastritis, unspecified, without bleeding: Secondary | ICD-10-CM | POA: Insufficient documentation

## 2018-01-15 DIAGNOSIS — K2980 Duodenitis without bleeding: Secondary | ICD-10-CM | POA: Diagnosis not present

## 2018-01-15 DIAGNOSIS — Z923 Personal history of irradiation: Secondary | ICD-10-CM | POA: Diagnosis not present

## 2018-01-15 DIAGNOSIS — K449 Diaphragmatic hernia without obstruction or gangrene: Secondary | ICD-10-CM | POA: Diagnosis not present

## 2018-01-15 DIAGNOSIS — R1319 Other dysphagia: Secondary | ICD-10-CM

## 2018-01-15 DIAGNOSIS — K319 Disease of stomach and duodenum, unspecified: Secondary | ICD-10-CM | POA: Insufficient documentation

## 2018-01-15 DIAGNOSIS — R1310 Dysphagia, unspecified: Secondary | ICD-10-CM

## 2018-01-15 DIAGNOSIS — Z9221 Personal history of antineoplastic chemotherapy: Secondary | ICD-10-CM | POA: Insufficient documentation

## 2018-01-15 DIAGNOSIS — K210 Gastro-esophageal reflux disease with esophagitis: Secondary | ICD-10-CM | POA: Diagnosis not present

## 2018-01-15 DIAGNOSIS — Z85818 Personal history of malignant neoplasm of other sites of lip, oral cavity, and pharynx: Secondary | ICD-10-CM | POA: Insufficient documentation

## 2018-01-15 DIAGNOSIS — Z87891 Personal history of nicotine dependence: Secondary | ICD-10-CM | POA: Diagnosis not present

## 2018-01-15 DIAGNOSIS — K3189 Other diseases of stomach and duodenum: Secondary | ICD-10-CM | POA: Diagnosis not present

## 2018-01-15 DIAGNOSIS — K228 Other specified diseases of esophagus: Secondary | ICD-10-CM | POA: Insufficient documentation

## 2018-01-15 DIAGNOSIS — Z85819 Personal history of malignant neoplasm of unspecified site of lip, oral cavity, and pharynx: Secondary | ICD-10-CM

## 2018-01-15 DIAGNOSIS — K2990 Gastroduodenitis, unspecified, without bleeding: Secondary | ICD-10-CM

## 2018-01-15 HISTORY — PX: ESOPHAGOGASTRODUODENOSCOPY (EGD) WITH PROPOFOL: SHX5813

## 2018-01-15 HISTORY — PX: BIOPSY: SHX5522

## 2018-01-15 HISTORY — PX: SAVORY DILATION: SHX5439

## 2018-01-15 SURGERY — ESOPHAGOGASTRODUODENOSCOPY (EGD) WITH PROPOFOL
Anesthesia: General

## 2018-01-15 MED ORDER — MINERAL OIL PO OIL
TOPICAL_OIL | ORAL | Status: AC
  Filled 2018-01-15: qty 30

## 2018-01-15 MED ORDER — MIDAZOLAM HCL 2 MG/2ML IJ SOLN: 0.5000 mg | Freq: Once | INTRAMUSCULAR | Status: DC | PRN

## 2018-01-15 MED ORDER — LACTATED RINGERS IV SOLN
INTRAVENOUS | Status: DC
  Administered 2018-01-15: 08:00:00 via INTRAVENOUS

## 2018-01-15 MED ORDER — LIDOCAINE HCL URETHRAL/MUCOSAL 2 % EX GEL
CUTANEOUS | Status: DC | PRN
  Administered 2018-01-15: 2 via TOPICAL

## 2018-01-15 MED ORDER — PROMETHAZINE HCL 25 MG/ML IJ SOLN: 6.2500 mg | INTRAMUSCULAR | Status: DC | PRN

## 2018-01-15 MED ORDER — LIDOCAINE VISCOUS HCL 2 % MT SOLN
OROMUCOSAL | Status: AC
  Filled 2018-01-15: qty 15

## 2018-01-15 MED ORDER — HYDROCODONE-ACETAMINOPHEN 7.5-325 MG PO TABS: 1.0000 | ORAL_TABLET | Freq: Once | ORAL | Status: DC | PRN

## 2018-01-15 MED ORDER — OMEPRAZOLE 20 MG PO CPDR: DELAYED_RELEASE_CAPSULE | ORAL | 3 refills | Status: DC

## 2018-01-15 MED ORDER — CHLORHEXIDINE GLUCONATE CLOTH 2 % EX PADS: 6.0000 | MEDICATED_PAD | Freq: Once | CUTANEOUS | Status: DC

## 2018-01-15 MED ORDER — LIDOCAINE VISCOUS HCL 2 % MT SOLN: 10.0000 mL | Freq: Once | OROMUCOSAL | Status: DC

## 2018-01-15 MED ORDER — PROPOFOL 10 MG/ML IV BOLUS
INTRAVENOUS | Status: DC | PRN
  Administered 2018-01-15 (×3): 20 mg via INTRAVENOUS

## 2018-01-15 MED ORDER — HYDROMORPHONE HCL 1 MG/ML IJ SOLN: 0.2500 mg | INTRAMUSCULAR | Status: DC | PRN

## 2018-01-15 MED ORDER — PROPOFOL 500 MG/50ML IV EMUL
INTRAVENOUS | Status: DC | PRN
  Administered 2018-01-15: 150 ug/kg/min via INTRAVENOUS

## 2018-01-15 NOTE — Anesthesia Postprocedure Evaluation (Signed)
Anesthesia Post Note  Patient: CLYDELL SPOSITO  Procedure(s) Performed: ESOPHAGOGASTRODUODENOSCOPY (EGD) WITH PROPOFOL (N/A ) SAVORY DILATION (N/A ) BIOPSY  Patient location during evaluation: PACU Anesthesia Type: General Level of consciousness: awake and alert and oriented Pain management: pain level controlled Vital Signs Assessment: post-procedure vital signs reviewed and stable Respiratory status: spontaneous breathing Cardiovascular status: blood pressure returned to baseline Postop Assessment: no apparent nausea or vomiting Anesthetic complications: no     Last Vitals:  Vitals:   01/15/18 0811  BP: 126/72  Pulse: (!) 52  Resp: 16  Temp: 36.6 C  SpO2: 98%    Last Pain:  Vitals:   01/15/18 0915  TempSrc:   PainSc: 0-No pain                 Syria Kestner

## 2018-01-15 NOTE — Anesthesia Preprocedure Evaluation (Signed)
Anesthesia Evaluation  Patient identified by MRN, date of birth, ID band Patient awake    Reviewed: Allergy & Precautions, NPO status , Patient's Chart, lab work & pertinent test results  Airway Mallampati: I  TM Distance: >3 FB Neck ROM: Full    Dental no notable dental hx. (+) Edentulous Upper, Edentulous Lower   Pulmonary neg pulmonary ROS, former smoker,    Pulmonary exam normal breath sounds clear to auscultation       Cardiovascular Exercise Tolerance: Good negative cardio ROS Normal cardiovascular examI Rhythm:Regular Rate:Normal  States very active -denies CP/DOE Baseline HR 50s   Neuro/Psych negative neurological ROS  negative psych ROS   GI/Hepatic negative GI ROS, Neg liver ROS, H/o Oropharngeal Ca diagnosed ~2017  Here for EGD poss dilation    Endo/Other  negative endocrine ROS  Renal/GU negative Renal ROS  negative genitourinary   Musculoskeletal negative musculoskeletal ROS (+)   Abdominal   Peds negative pediatric ROS (+)  Hematology negative hematology ROS (+)   Anesthesia Other Findings   Reproductive/Obstetrics negative OB ROS                             Anesthesia Physical Anesthesia Plan  ASA: II  Anesthesia Plan: General   Post-op Pain Management:    Induction: Intravenous  PONV Risk Score and Plan:   Airway Management Planned: Nasal Cannula and Simple Face Mask  Additional Equipment:   Intra-op Plan:   Post-operative Plan:   Informed Consent: I have reviewed the patients History and Physical, chart, labs and discussed the procedure including the risks, benefits and alternatives for the proposed anesthesia with the patient or authorized representative who has indicated his/her understanding and acceptance.   Dental advisory given  Plan Discussed with: CRNA  Anesthesia Plan Comments:         Anesthesia Quick Evaluation

## 2018-01-15 NOTE — Transfer of Care (Signed)
Immediate Anesthesia Transfer of Care Note  Patient: Alexander [name]  Procedure(s) Performed: ESOPHAGOGASTRODUODENOSCOPY (EGD) WITH PROPOFOL (N/A ) SAVORY DILATION (N/A ) BIOPSY  Patient Location: PACU  Anesthesia Type:General  Level of Consciousness: awake, alert  and oriented  Airway & Oxygen Therapy: Patient Spontanous Breathing  Post-op Assessment: Report given to RN  Post vital signs: Reviewed and stable  Last Vitals:  Vitals Value Taken Time  BP    Temp    Pulse 57 01/15/2018  9:38 AM  Resp 19 01/15/2018  9:38 AM  SpO2 98 % 01/15/2018  9:38 AM  Vitals shown include unvalidated device data.  Last Pain:  Vitals:   01/15/18 0915  TempSrc:   PainSc: 0-No pain      Patients Stated Pain Goal: 6 (76/[id number])  Complications: No apparent anesthesia complications

## 2018-01-15 NOTE — Discharge Instructions (Signed)
I dilated your esophagus DUE TO a stricture in your esophagus. You have esophagitis. A SMALL HIATAL HERNIA, gastritis, and duodenitis. I biopsied your stomach.    DRINK WATER TO KEEP YOUR URINE LIGHT YELLOW.  YOU MAY STILL HAVE TROUBLE SWALLOWING DUE TO ENLARGED NODES IN THE MIDDLE OF YOUR CHEST,. IF SO, FOLLOW A SOFT MECHANICAL DIET, WHICH MEANS MEATS SHOULD BE CHOPPED OR GROUND ONLY AND DO NOT EAT CHUNKS OF ANYTHING.   START OMEPRAZOLE 30 MINUTES PRIOR TO first MEAL.   YOUR BIOPSY RESULTS WILL BE BACK IN 5 BUSINESS DAYS.  FOLLOW UP IN 4 MOS.  UPPER ENDOSCOPY AFTER CARE Read the instructions outlined below and refer to this sheet in the next week. These discharge instructions provide you with general information on caring for yourself after you leave the hospital. While your treatment has been planned according to the most current medical practices available, unavoidable complications occasionally occur. If you have any problems or questions after discharge, call DR. Kaarin Pardy, [phone].  ACTIVITY  You may resume your regular activity, but move at a slower pace for the next 24 hours.   Take frequent rest periods for the next 24 hours.   Walking will help get rid of the air and reduce the bloated feeling in your belly (abdomen).   No driving for 24 hours (because of the medicine (anesthesia) used during the test).   You may shower.   Do not sign any important legal documents or operate any machinery for 24 hours (because of the anesthesia used during the test).    NUTRITION  Drink plenty of fluids.   You may resume your normal diet as instructed by your doctor.   Begin with a light meal and progress to your normal diet. Heavy or fried foods are harder to digest and may make you feel sick to your stomach (nauseated).   Avoid alcoholic beverages for 24 hours or as instructed.    MEDICATIONS  You may resume your normal medications.   WHAT YOU CAN EXPECT TODAY  Some  feelings of bloating in the abdomen.   Passage of more gas than usual.    IF YOU HAD A BIOPSY TAKEN DURING THE UPPER ENDOSCOPY:  Eat a soft diet IF YOU HAVE NAUSEA, BLOATING, ABDOMINAL PAIN, OR VOMITING.    FINDING OUT THE RESULTS OF YOUR TEST Not all test results are available during your visit. DR. Oneida Alar WILL CALL YOU WITHIN 14 DAYS OF YOUR PROCEDUE WITH YOUR RESULTS. Do not assume everything is normal if you have not heard from DR. Jamir Rone, CALL HER OFFICE AT [phone].  SEEK IMMEDIATE MEDICAL ATTENTION AND CALL THE OFFICE: [phone] IF:  You have more than a spotting of blood in your stool.   Your belly is swollen (abdominal distention).   You are nauseated or vomiting.   You have a temperature over 101F.   You have abdominal pain or discomfort that is severe or gets worse throughout the day.  Gastritis  Gastritis is an inflammation (the body's way of reacting to injury and/or infection) of the stomach. It is often caused by viral or bacterial (germ) infections. It can also be caused BY ASPIRIN, BC/GOODY POWDER'S, (IBUPROFEN) MOTRIN, OR ALEVE (NAPROXEN), chemicals (including alcohol), SPICY FOODS, and medications. This illness may be associated with generalized malaise (feeling tired, not well), UPPER ABDOMINAL STOMACH cramps, and fever. One common bacterial cause of gastritis is an organism known as H. Pylori. This can be treated with antibiotics.   ESOPHAGEAL STRICTURE  Esophageal  strictures can be caused by stomach acid backing up into the tube that carries food from the mouth down to the stomach (lower esophagus).  TREATMENT There are a number of medicines used to treat reflux/stricture, including: Antacids.  Proton-pump inhibitors: OMEPRAZOLE

## 2018-01-15 NOTE — H&P (Signed)
Primary Care Physician:  Manon Hilding, MD Primary Gastroenterologist:  Dr. Oneida Alar  Pre-Procedure History & Physical: HPI:  Alexander [name] is a 67 y.o. male here for DYSPHAGIA.  Past Medical History:  Diagnosis Date  . 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 Oropharyngeal cancer ([place]) 07/28/2014   dx. 3 weeks ago.- Dr. Oneal [facility] [place], [place].  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 COLONOSCOPY N/A 03/13/2016   Procedure: COLONOSCOPY;  Surgeon: Danie Binder, MD;  Location: [facility];  Service: Endoscopy;  Laterality: N/A;  2:15 PM  . ESOPHAGOGASTRODUODENOSCOPY (EGD) WITH PROPOFOL N/A 08/17/2014   Procedure: ESOPHAGOGASTRODUODENOSCOPY (EGD) WITH PROPOFOL (procedure #1);  Surgeon: Aviva Signs Md, MD;  Location: [facility];  Service: General;  Laterality: N/A;  . MULTIPLE EXTRACTIONS WITH ALVEOLOPLASTY N/A 08/12/2014   Procedure: Extraction of tooth #'s 6,17,22,23,24,25,26,27 with alveoloplasty;  Surgeon: Lenn Cal, DDS;  Location: [facility];  Service: Oral Surgery;  Laterality: N/A;  . PANENDOSCOPY N/A 08/06/2014   Procedure: PANENDOSCOPY WITH BIOPSY;  Surgeon: Leta Baptist, MD;  Location: [facility];  Service: ENT;  Laterality: N/A;  . PEG PLACEMENT Left 08/17/14  . PEG PLACEMENT N/A 08/17/2014   Procedure: PERCUTANEOUS ENDOSCOPIC GASTROSTOMY (PEG) PLACEMENT (procedure #1);  Surgeon: Aviva Signs Md, MD;  Location: [facility];  Service: General;  Laterality: N/A;  . PORT-A-CATH REMOVAL Right 07/17/2016   Procedure: MINOR REMOVAL PORT-A-CATH;  Surgeon: Aviva Signs, MD;  Location: [facility];  Service: General;  Laterality: Right;  . PORTACATH PLACEMENT Right 08/17/14  . PORTACATH PLACEMENT Right 08/17/2014   Procedure: INSERTION PORT-A-CATH (procedure #2);  Surgeon: Aviva Signs Md, MD;  Location: [facility];  Service: General;   Laterality: Right;    Prior to Admission medications   Medication Sig Start Date End Date Taking? Authorizing Provider  lactose free nutrition (BOOST) LIQD Take 237 mLs by mouth daily.   Yes [provider]  Diphenhyd-Hydrocort-Nystatin (FIRST-DUKES MOUTHWASH) SUSP Use as directed 5 mLs in the mouth or throat 4 (four) times daily as needed. Patient not taking: Reported on 01/01/2018 04/07/16   Baird Cancer, PA-C    Allergies as of 11/12/2017  . (No Known Allergies)    Family History  Problem Relation Age of Onset  . Colon cancer Neg Hx   . Gastric cancer Neg Hx   . Esophageal cancer Neg Hx     Social History   Socioeconomic History  . Marital status: Legally Separated    Spouse name: Not on file  . Number of children: 5  . Years of education: Not on file  . Highest education level: Not on file  Occupational History  . Not on file  Social Needs  . Financial resource strain: Not on file  . Food insecurity:    Worry: Not on file    Inability: Not on file  . Transportation needs:    Medical: Not on file    Non-medical: Not on file  Tobacco Use  . Smoking status: Former Smoker    Packs/day: 0.50    Years: 30.00    Pack years: 15.00    Last attempt to quit: 07/22/2014    Years since quitting: 3.4  . Smokeless tobacco: Never Used  Substance and Sexual Activity  . Alcohol use: No    Alcohol/week: 0.0 standard  drinks    Comment: None currently (11/09/17); previously 1-2 beers on the weekend  . Drug use: No  . Sexual activity: Not on file  Lifestyle  . Physical activity:    Days per week: Not on file    Minutes per session: Not on file  . Stress: Not on file  Relationships  . Social connections:    Talks on phone: Not on file    Gets together: Not on file    Attends religious service: Not on file    Active member of club or organization: Not on file    Attends meetings of clubs or organizations: Not on file    Relationship status: Not on file  .  Intimate partner violence:    Fear of current or ex partner: Not on file    Emotionally abused: Not on file    Physically abused: Not on file    Forced sexual activity: Not on file  Other Topics Concern  . Not on file  Social History Narrative  . Not on file    Review of Systems: See HPI, otherwise negative ROS   Physical Exam: BP 126/72   Pulse (!) 52   Temp 97.9 F (36.6 C) (Oral)   Resp 16   Ht 6\' 1"  (1.854 m)   Wt 63.5 kg   SpO2 98%   BMI 18.47 kg/m  General:   Alert,  pleasant and cooperative in NAD Head:  Normocephalic and atraumatic. Neck:  Supple; Lungs:  Clear throughout to auscultation.    Heart:  Regular rate and rhythm. Abdomen:  Soft, nontender and nondistended. Normal bowel sounds, without guarding, and without rebound.   Neurologic:  Alert and  oriented x4;  grossly normal neurologically.  Impression/Plan:     DYSPHAGIA  PLAN:  EGD/possible DILation TODAY. DISCUSSED PROCEDURE, BENEFITS, & RISKS: < 1% chance of medication reaction, bleeding, OR perforation.

## 2018-01-15 NOTE — Op Note (Addendum)
[facility] Patient Name: Alexander [name] Procedure Date: 01/15/2018 9:06 AM MRN: [id number] Date of Birth: 1950-08-03 Attending MD: Barney Drain MD, MD CSN: [id number] Age: 67 Admit Type: Outpatient Procedure:                Upper GI endoscopy WITH COLD FORCEPS                            BIOPSY/ESOPHAGEAL DILATION Indications:              Dysphagia Providers:                Barney Drain MD, MD, Otis Peak B. Sharon Seller, RN,                            Randa Spike, Technician Referring MD:             Manon Hilding MD, MD Medicines:                Propofol per Anesthesia Complications:            No immediate complications. Estimated Blood Loss:     Estimated blood loss was minimal. Procedure:                Pre-Anesthesia Assessment:                           - Prior to the procedure, a History and Physical                            was performed, and patient medications and                            allergies were reviewed. The patient's tolerance of                            previous anesthesia was also reviewed. The risks                            and benefits of the procedure and the sedation                            options and risks were discussed with the patient.                            All questions were answered, and informed consent                            was obtained. Prior Anticoagulants: The patient has                            taken no previous anticoagulant or antiplatelet                            agents. ASA Grade Assessment: II - A patient with  mild systemic disease. After reviewing the risks                            and benefits, the patient was deemed in                            satisfactory condition to undergo the procedure.                            After obtaining informed consent, the endoscope was                            passed under direct vision. Throughout the                            procedure,  the patient's blood pressure, pulse, and                            oxygen saturations were monitored continuously. The                            [id number]-H190 ([id number]) scope was introduced through the                            mouth, and advanced to the duodenal bulb. The upper                            GI endoscopy was accomplished without difficulty.                            The patient tolerated the procedure well. Scope In: 9:19:57 AM Scope Out: 9:28:29 AM Total Procedure Duration: 0 hours 8 minutes 32 seconds  Findings:      LA Grade B (one or more mucosal breaks greater than 5 mm, not extending       between the tops of two mucosal folds) esophagitis was found. A       guidewire was placed and the scope was withdrawn. Dilation was performed       with a Savary dilator with mild resistance at 15 mm, 16 mm and 17 mm.       Slight deformity on distal esophagus due to exterinisic complression but       able to distend distal esophagus fully.      A small hiatal hernia was present.      Diffuse moderate inflammation characterized by congestion (edema) and       erythema was found in the entire examined stomach. Biopsies(2: body, 3:       antrum) were taken with a cold forceps for Helicobacter pylori testing.      Diffuse moderate inflammation characterized by congestion (edema),       erosions and erythema was found in the duodenal bulb.      The second portion of the duodenum was normal. Impression:               - LA Grade B reflux esophagitis. Dilated.                           -  Small hiatal hernia.                           - MODERATE Gastritis/MILD Duodenitis. Moderate Sedation:      Per Anesthesia Care Recommendation:           - Patient has a contact number available for                            emergencies. The signs and symptoms of potential                            delayed complications were discussed with the                            patient. Return to normal  activities tomorrow.                            Written discharge instructions were provided to the                            patient.                           - Soft diet.                           - Continue present medications. ADD OMEPRAZOLE ONCE                            DAILY.                           - Await pathology results.                           - Return to my office in 4 months. Procedure Code(s):        --- Professional ---                           [phone], Esophagogastroduodenoscopy, flexible,                            transoral; with insertion of guide wire followed by                            passage of dilator(s) through esophagus over guide                            wire                           36629, 35, Esophagogastroduodenoscopy, flexible,                            transoral; with biopsy, single or multiple Diagnosis Code(s):        --- Professional ---  K21.0, Gastro-esophageal reflux disease with                            esophagitis                           K44.9, Diaphragmatic hernia without obstruction or                            gangrene                           K29.70, Gastritis, unspecified, without bleeding                           K29.80, Duodenitis without bleeding                           R13.10, Dysphagia, unspecified CPT copyright 2018 American Medical Association. All rights reserved. The codes documented in this report are preliminary and upon coder review may  be revised to meet current compliance requirements. Barney Drain, MD Barney Drain MD, MD 01/15/2018 9:52:47 AM This report has been signed electronically. Number of Addenda: 0

## 2018-01-17 ENCOUNTER — Telehealth: Payer: Self-pay | Admitting: Gastroenterology

## 2018-01-17 NOTE — Telephone Encounter (Signed)
Please call pt. His stomach Bx shows gastritis.    DRINK WATER TO KEEP YOUR URINE LIGHT YELLOW. YOU MAY STILL HAVE TROUBLE SWALLOWING DUE TO ENLARGED NODES IN THE MIDDLE OF YOUR CHEST,. IF SO, FOLLOW A SOFT MECHANICAL DIET, WHICH MEANS MEATS SHOULD BE CHOPPED OR GROUND ONLY AND DO NOT EAT CHUNKS OF ANYTHING.   START OMEPRAZOLE 30 MINUTES PRIOR TO first MEAL.   FOLLOW UP IN 4 MOS E30 DYSPHAGIA/GASTRITIS.

## 2018-01-17 NOTE — Telephone Encounter (Signed)
Left Vm for a return call.  

## 2018-01-18 NOTE — Telephone Encounter (Signed)
cc'ed to pcp °

## 2018-01-18 NOTE — Telephone Encounter (Signed)
PT is aware.

## 2018-01-21 ENCOUNTER — Encounter (HOSPITAL_COMMUNITY): Payer: Self-pay | Admitting: Gastroenterology

## 2018-02-08 ENCOUNTER — Inpatient Hospital Stay (HOSPITAL_COMMUNITY): Payer: Medicare Other | Attending: Hematology

## 2018-02-08 DIAGNOSIS — R52 Pain, unspecified: Secondary | ICD-10-CM | POA: Insufficient documentation

## 2018-02-08 DIAGNOSIS — R1310 Dysphagia, unspecified: Secondary | ICD-10-CM | POA: Diagnosis not present

## 2018-02-08 DIAGNOSIS — R1319 Other dysphagia: Secondary | ICD-10-CM

## 2018-02-08 DIAGNOSIS — C109 Malignant neoplasm of oropharynx, unspecified: Secondary | ICD-10-CM

## 2018-02-08 DIAGNOSIS — E039 Hypothyroidism, unspecified: Secondary | ICD-10-CM | POA: Diagnosis not present

## 2018-02-08 DIAGNOSIS — Z79899 Other long term (current) drug therapy: Secondary | ICD-10-CM | POA: Diagnosis not present

## 2018-02-08 LAB — CBC WITH DIFFERENTIAL/PLATELET
Abs Immature Granulocytes: 0.02 10*3/uL (ref 0.00–0.07)
Basophils Absolute: 0 10*3/uL (ref 0.0–0.1)
Basophils Relative: 0 %
Eosinophils Absolute: 0.1 10*3/uL (ref 0.0–0.5)
Eosinophils Relative: 2 %
HEMATOCRIT: 43.3 % (ref 39.0–52.0)
HEMOGLOBIN: 13.7 g/dL (ref 13.0–17.0)
Immature Granulocytes: 0 %
LYMPHS PCT: 16 %
Lymphs Abs: 0.9 10*3/uL (ref 0.7–4.0)
MCH: 30 pg (ref 26.0–34.0)
MCHC: 31.6 g/dL (ref 30.0–36.0)
MCV: 95 fL (ref 80.0–100.0)
MONO ABS: 0.6 10*3/uL (ref 0.1–1.0)
MONOS PCT: 11 %
Neutro Abs: 4.1 10*3/uL (ref 1.7–7.7)
Neutrophils Relative %: 71 %
Platelets: 213 10*3/uL (ref 150–400)
RBC: 4.56 MIL/uL (ref 4.22–5.81)
RDW: 15 % (ref 11.5–15.5)
WBC: 5.7 10*3/uL (ref 4.0–10.5)
nRBC: 0 % (ref 0.0–0.2)

## 2018-02-08 LAB — COMPREHENSIVE METABOLIC PANEL
ALK PHOS: 47 U/L (ref 38–126)
ALT: 13 U/L (ref 0–44)
AST: 18 U/L (ref 15–41)
Albumin: 4.1 g/dL (ref 3.5–5.0)
Anion gap: 8 (ref 5–15)
BUN: 20 mg/dL (ref 8–23)
CALCIUM: 9.2 mg/dL (ref 8.9–10.3)
CO2: 27 mmol/L (ref 22–32)
CREATININE: 1.74 mg/dL — AB (ref 0.61–1.24)
Chloride: 106 mmol/L (ref 98–111)
GFR, EST AFRICAN AMERICAN: 45 mL/min — AB (ref >60)
GFR, EST NON AFRICAN AMERICAN: 39 mL/min — AB (ref >60)
Glucose, Bld: 85 mg/dL (ref 70–99)
Potassium: 4.3 mmol/L (ref 3.5–5.1)
Sodium: 141 mmol/L (ref 135–145)
Total Bilirubin: 0.7 mg/dL (ref 0.3–1.2)
Total Protein: 7.4 g/dL (ref 6.5–8.1)

## 2018-02-08 LAB — LACTATE DEHYDROGENASE: LDH: 124 U/L (ref 98–192)

## 2018-02-11 ENCOUNTER — Other Ambulatory Visit (HOSPITAL_COMMUNITY): Payer: Self-pay | Admitting: Internal Medicine

## 2018-02-15 ENCOUNTER — Inpatient Hospital Stay (HOSPITAL_BASED_OUTPATIENT_CLINIC_OR_DEPARTMENT_OTHER): Payer: Medicare Other | Admitting: Internal Medicine

## 2018-02-15 ENCOUNTER — Encounter (HOSPITAL_COMMUNITY): Payer: Self-pay | Admitting: Internal Medicine

## 2018-02-15 ENCOUNTER — Other Ambulatory Visit: Payer: Self-pay

## 2018-02-15 VITALS — BP 104/60 | HR 44 | Temp 98.4°F | Resp 16 | Wt 143.0 lb

## 2018-02-15 DIAGNOSIS — E039 Hypothyroidism, unspecified: Secondary | ICD-10-CM

## 2018-02-15 DIAGNOSIS — Z79899 Other long term (current) drug therapy: Secondary | ICD-10-CM | POA: Diagnosis not present

## 2018-02-15 DIAGNOSIS — R1310 Dysphagia, unspecified: Secondary | ICD-10-CM | POA: Diagnosis not present

## 2018-02-15 DIAGNOSIS — R52 Pain, unspecified: Secondary | ICD-10-CM

## 2018-02-15 DIAGNOSIS — C109 Malignant neoplasm of oropharynx, unspecified: Secondary | ICD-10-CM | POA: Diagnosis not present

## 2018-02-15 NOTE — Progress Notes (Signed)
Diagnosis Oropharyngeal carcinoma ([place]) - Plan: CBC with Differential/Platelet, Comprehensive metabolic panel, Lactate dehydrogenase, T4 AND TSH  Staging Cancer Staging Oropharyngeal carcinoma (HCC) Staging form: Pharynx - Oropharynx, AJCC 7th Edition - Clinical: Stage IVA (T4a, N2b, M0) - Unsigned   Assessment and Plan:  1.  Oropharyngeal cancer.  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 S/P curative concomitant chemo/XRT with remission on PET imaging in October 2016. He was previously last seen at [facility] in 05/2016 and has failed to keep follow-up appointments.  He called in requesting pain medications.  Previously, he was instructed he would no longer be prescribed opiods by PA Kefalas.    Pt was recommended for PET scan but did not have imaging done.  He was set up for CT neck CAP that was done on 09/20/2017 and showed   IMPRESSION:  CT neck:  1. Limited assessment of the pharynx due to motion. 2. Decreased size of low-density left level II lymph node. No evidence of new or progressive cervical lymphadenopathy.  1. Interval development of bulky mediastinal adenopathy concerning for metastatic disease. 2. Interval development of pulmonary nodules within the left lower lobe concerning for the possibility of pulmonary metastatic disease. 3. No definite metastatic disease identified within the abdomen or pelvis. Multiple hepatic cysts are demonstrated.  Pt was referred to Dr. Benjamine Mola and was seen by him on 10/07/2017 and recommended PET/CT.  Pt did not have imaging done.  He has smoking history.  I discussed with him he will again be set up for PET scan and will RTC to go over results.  Compliance with procedure encouraged.  Pt will be set up for biopsy pending results.    2.  Dysphagia.  Pt was seen by GI and underwent EGD on 01/15/2018 and showed esophagitis, gastritis and hiatal hernia.  Pt was dilated.   He is again set up for  PET scan for interval evaluation of scan findings.   3.  Pain.  Pt was previously referred to pain clinic for evaluation and management.    4.  RI.  Labs done 02/08/2018 reviewed and showed WBC 5.7 HB 13.7 plts 213,000.  Chemistries WNL with K+ 4.3 and normal LFTs.  Cr is elevated at 1.74.  Pt was previously referred to nephrology.  Unclear if he went to appointment. Will again refer on RTC once scan has been reviewed.    5.  Hypothyroidism.  TSH noted to be elevated at 8.8 on labs done 08/10/2017.  He has not followed up with PCP or endocrinology.  Will repeat labs on RTC. Pt not on synthroid.    6.  Noncompliance. Pt has not followed up with Dr. Benjamine Mola or this office and has not had imaging done as previously recommended. Will recommend social work evaluation on RTC to assess deterrent to compliance.    25 minutes spent with more than 50% spent in counseling and coordination of care.    Interval History: Historical data obtained from note dated 08/14/2017.   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 S/P curative concomitant chemo/XRT with remission on PET imaging in October 2016.  Current Status:  Pt is seen today for follow-up.  He has not had PET scan done as previously ordered.  He reports minor improvement in dysphagia after recent GI procedure.  He has not followed up with Dr Benjamine Mola.     Oropharyngeal carcinoma ([place])   07/27/2014 Imaging  CT neck- Advanced stage oropharyngeal cancer with necrotic adenopathy accounting for the left neck swelling.    07/28/2014 Initial Diagnosis    Oropharyngeal cancer    08/03/2014 Imaging    CT CAP- L supraclavicular lymphadenopathy is not completely visualized. This is better seen on the previous neck CT from 07/27/2014. Otherwise, no evidence for metastatic disease in the chest, abdomen, or pelvis.    08/03/2014 Imaging    Bone scan- Uptake at adjacent anterior LEFT 6, 7, 8 ribs likely representing trauma/fractures.  Questionable nonspecific increased tracer localization at the posterior RIGHT 8th and 9th ribs, the adjacent nature which raises a a question of trauma as well    08/06/2014 Pathology Results    Dr. Benjamine Mola- Oropharynx, biopsy, Left - INVASIVE SQUAMOUS CELL CARCINOMA.    08/12/2014 Procedure    Dr. Enrique Sack- 1. Multiple extraction of tooth numbers 6, 17, 22, 23, 24, 25, 26, and 27. 3 Quadrants of alveoloplasty    08/17/2014 Pathology Results    PORT and G-TUBE placed by Dr. Carlis Stable.    08/26/2014 PET scan    Large hypermetabolic mass in the left base of tongue. Activity extends across midline to the right base tongue. 2. Intensely hypermetabolic left cervical metastatic lymph nodes. Lymph nodes extend from the left level II position to the left supraclavi    09/01/2014 - 09/22/2014 Chemotherapy    Concurrent chemoradiation with Cisplatin 100 mg/m2 x 2 cycles with Neulasta support. Held cycle #3 d/t renal toxicity.     09/03/2014 - 10/23/2014 Radiation Therapy    Treated in [place], IMRT Isidore Moos).  Base of tongue and bilat neck. Total dose: 70 Gy in 35 fractions. (of note, he did miss several treatments requiring BID dosing towards the end of treatment).     01/25/2015 PET scan    Near complete resolution of metabolic activity at the base of tongue. Minimal residual activity is likely post treatment effect. 2. Complete resolution of metabolic activity above LEFT cervical lymph nodes. No evidence of residual metabolically active     4/88/8916 Procedure    Port-a-cath removed [name])       Problem List Patient Active Problem List   Diagnosis Date Noted  . Gastritis and gastroduodenitis [K29.70, K25.90]   . Dysphagia [R13.10] 11/09/2017  . History of oropharyngeal cancer [Z85.819] 11/09/2017  . Oropharyngeal carcinoma ([place]) [C10.9] 07/28/2014    Past Medical History Past Medical History:  Diagnosis Date  . 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  Oropharyngeal cancer ([place]) 07/28/2014   dx. 3 weeks ago.- [facility]. [facility] [place], [place].  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 BIOPSY  01/15/2018   Procedure: BIOPSY;  Surgeon: Danie Binder, MD;  [facility];  Service: Endoscopy;;  gastric  . COLONOSCOPY N/A 03/13/2016   Procedure: COLONOSCOPY;  Surgeon: Danie Binder, MD;  [facility];  Service: Endoscopy;  Laterality: N/A;  2:15 PM  . ESOPHAGOGASTRODUODENOSCOPY (EGD) WITH PROPOFOL N/A 08/17/2014   Procedure: ESOPHAGOGASTRODUODENOSCOPY (EGD) WITH PROPOFOL (procedure #1);  Surgeon: Aviva Signs Md, MD;  Location: [facility];  Service: General;  Laterality: N/A;  . ESOPHAGOGASTRODUODENOSCOPY (EGD) WITH PROPOFOL N/A 01/15/2018   Procedure: ESOPHAGOGASTRODUODENOSCOPY (EGD) WITH PROPOFOL;  Surgeon: Danie Binder, MD;  [facility];  Service: Endoscopy;  Laterality: N/A;  9:30am  .  MULTIPLE EXTRACTIONS WITH ALVEOLOPLASTY N/A 08/12/2014   Procedure: Extraction of tooth #'s 6,17,22,23,24,25,26,27 with alveoloplasty;  Surgeon: Lenn Cal, DDS;  Location: [facility];  Service: Oral Surgery;  Laterality: N/A;  . PANENDOSCOPY N/A 08/06/2014   Procedure: PANENDOSCOPY WITH BIOPSY;  Surgeon: Leta Baptist, MD;  Location: [place];  Service: ENT;  Laterality: N/A;  . PEG PLACEMENT Left 08/17/14  . PEG PLACEMENT N/A 08/17/2014   Procedure: PERCUTANEOUS ENDOSCOPIC GASTROSTOMY (PEG) PLACEMENT (procedure #1);  Surgeon: Aviva Signs Md, MD;  Location: [facility];  Service: General;  Laterality: N/A;  . PORT-A-CATH REMOVAL Right 07/17/2016   Procedure: MINOR REMOVAL PORT-A-CATH;  Surgeon: Aviva Signs, MD;  Location: [facility];  Service: General;  Laterality: Right;  . PORTACATH PLACEMENT Right 08/17/14  . PORTACATH PLACEMENT Right 08/17/2014   Procedure: INSERTION PORT-A-CATH (procedure #2);  Surgeon:  Aviva Signs Md, MD;  Location: [facility];  Service: General;  Laterality: Right;  . SAVORY DILATION N/A 01/15/2018   Procedure: SAVORY DILATION;  Surgeon: Danie Binder, MD;  [facility];  Service: Endoscopy;  Laterality: N/A;    Family History Family History  Problem Relation Age of Onset  . Colon cancer Neg Hx   . Gastric cancer Neg Hx   . Esophageal cancer Neg Hx      Social History  reports that he quit smoking about 3 years ago. He has a 15.00 pack-year smoking history. He has never used smokeless tobacco. He reports that he does not drink alcohol or use drugs.  Medications  Current Outpatient Medications:  .  lactose free nutrition (BOOST) LIQD, Take 237 mLs by mouth daily., Disp: , Rfl:  .  omeprazole (PRILOSEC) 20 MG capsule, 1 PO 30 MINS PRIOR TO BREAKFAST., Disp: 90 capsule, Rfl: 3  Allergies Patient has no known allergies.  Review of Systems Review of Systems - Oncology ROS negative other than minor improvement in dysphagia   Physical Exam  Vitals Wt Readings from Last 3 Encounters:  02/15/18 143 lb (64.9 kg)  01/15/18 140 lb (63.5 kg)  11/09/17 135 lb 3.2 oz (61.3 kg)   Temp Readings from Last 3 Encounters:  02/15/18 98.4 F (36.9 C) (Oral)  01/15/18 97.8 F (36.6 C) (Oral)  01/08/18 98.8 F (37.1 C) (Oral)   BP Readings from Last 3 Encounters:  02/15/18 104/60  01/15/18 112/63  01/08/18 (!) 102/59   Pulse Readings from Last 3 Encounters:  02/15/18 (!) 44  01/15/18 (!) 53  01/08/18 69    Constitutional: Well-developed, well-nourished, and in no distress.   HENT: Head: Normocephalic and atraumatic.  Mouth/Throat: No oropharyngeal exudate. Mucosa moist. Eyes: Pupils are equal, round, and reactive to light. Conjunctivae are normal. No scleral icterus.  Neck: Normal range of motion. Neck supple. No JVD present.  Cardiovascular: Normal rate, regular rhythm and normal heart sounds.  Exam reveals no gallop and no friction rub.   No  murmur heard. Pulmonary/Chest: Effort normal and breath sounds normal. No respiratory distress. No wheezes.No rales.  Abdominal: Soft. Bowel sounds are normal. No distension. There is no tenderness. There is no guarding.  Musculoskeletal: No edema or tenderness.  Lymphadenopathy: No cervical, axillary or supraclavicular adenopathy.  Neurological: Alert and oriented to person, place, and time. No cranial nerve deficit.  Skin: Skin is warm and dry. No rash noted. No erythema. No pallor.  Psychiatric: Affect and judgment normal.   Labs No visits with results within 3 Day(s) from this visit.  Latest known visit with  results is:  Appointment on 02/08/2018  Component Date Value Ref Range Status  . WBC 02/08/2018 5.7  4.0 - 10.5 K/uL Final  . RBC 02/08/2018 4.56  4.22 - 5.81 MIL/uL Final  . Hemoglobin 02/08/2018 13.7  13.0 - 17.0 g/dL Final  . HCT 02/08/2018 43.3  39.0 - 52.0 % Final  . MCV 02/08/2018 95.0  80.0 - 100.0 fL Final  . MCH 02/08/2018 30.0  26.0 - 34.0 pg Final  . MCHC 02/08/2018 31.6  30.0 - 36.0 g/dL Final  . RDW 02/08/2018 15.0  11.5 - 15.5 % Final  . Platelets 02/08/2018 213  150 - 400 K/uL Final  . nRBC 02/08/2018 0.0  0.0 - 0.2 % Final  . Neutrophils Relative % 02/08/2018 71  % Final  . Neutro Abs 02/08/2018 4.1  1.7 - 7.7 K/uL Final  . Lymphocytes Relative 02/08/2018 16  % Final  . Lymphs Abs 02/08/2018 0.9  0.7 - 4.0 K/uL Final  . Monocytes Relative 02/08/2018 11  % Final  . Monocytes Absolute 02/08/2018 0.6  0.1 - 1.0 K/uL Final  . Eosinophils Relative 02/08/2018 2  % Final  . Eosinophils Absolute 02/08/2018 0.1  0.0 - 0.5 K/uL Final  . Basophils Relative 02/08/2018 0  % Final  . Basophils Absolute 02/08/2018 0.0  0.0 - 0.1 K/uL Final  . Immature Granulocytes 02/08/2018 0  % Final  . Abs Immature Granulocytes 02/08/2018 0.02  0.00 - 0.07 K/uL Final   Performed at [facility], [address], [place], [place] [address]  . Sodium 02/08/2018 141  135 - 145 mmol/L Final   . Potassium 02/08/2018 4.3  3.5 - 5.1 mmol/L Final  . Chloride 02/08/2018 106  98 - 111 mmol/L Final  . CO2 02/08/2018 27  22 - 32 mmol/L Final  . Glucose, Bld 02/08/2018 85  70 - 99 mg/dL Final  . BUN 02/08/2018 20  8 - 23 mg/dL Final  . Creatinine, Ser 02/08/2018 1.74* 0.61 - 1.24 mg/dL Final  . Calcium 02/08/2018 9.2  8.9 - 10.3 mg/dL Final  . Total Protein 02/08/2018 7.4  6.5 - 8.1 g/dL Final  . Albumin 02/08/2018 4.1  3.5 - 5.0 g/dL Final  . AST 02/08/2018 18  15 - 41 U/L Final  . ALT 02/08/2018 13  0 - 44 U/L Final  . Alkaline Phosphatase 02/08/2018 47  38 - 126 U/L Final  . Total Bilirubin 02/08/2018 0.7  0.3 - 1.2 mg/dL Final  . GFR calc non Af Amer 02/08/2018 39* >60 mL/min Final  . GFR calc Af Amer 02/08/2018 45* >60 mL/min Final   Comment: (NOTE) The eGFR has been calculated using the CKD EPI equation. This calculation has not been validated in all clinical situations. eGFR's persistently <60 mL/min signify possible Chronic Kidney Disease.   Georgiann Hahn gap 02/08/2018 8  5 - 15 Final   Performed at [facility], [address], [place], [place] [address]  . LDH 02/08/2018 124  98 - 192 U/L Final   Performed at [facility], [address] [place], [place] [address]     Pathology Orders Placed This Encounter  Procedures  . CBC with Differential/Platelet    Standing Status:   Future    Standing Expiration Date:   02/16/2019  . Comprehensive metabolic panel    Standing Status:   Future    Standing Expiration Date:   02/16/2019  . Lactate dehydrogenase    Standing Status:   Future    Standing Expiration  Date:   02/16/2019  . T4 AND TSH    Standing Status:   Future    Standing Expiration Date:   02/16/2019       Zoila Shutter MD

## 2018-02-18 ENCOUNTER — Encounter (HOSPITAL_COMMUNITY)
Admission: RE | Admit: 2018-02-18 | Discharge: 2018-02-18 | Disposition: A | Discharge status: Home / Self Care | Payer: Medicare Other | Source: Ambulatory Visit | Attending: Internal Medicine | Admitting: Internal Medicine

## 2018-02-18 ENCOUNTER — Ambulatory Visit: Payer: Self-pay | Admitting: Nurse Practitioner

## 2018-02-18 DIAGNOSIS — C760 Malignant neoplasm of head, face and neck: Secondary | ICD-10-CM | POA: Diagnosis not present

## 2018-02-18 DIAGNOSIS — R1319 Other dysphagia: Secondary | ICD-10-CM | POA: Diagnosis not present

## 2018-02-18 MED ORDER — FLUDEOXYGLUCOSE F - 18 (FDG) INJECTION
8.2400 | Freq: Once | INTRAVENOUS | Status: AC | PRN
  Administered 2018-02-18: 8.24 via INTRAVENOUS

## 2018-02-25 ENCOUNTER — Inpatient Hospital Stay (HOSPITAL_COMMUNITY): Payer: Medicare Other | Attending: Internal Medicine | Admitting: Internal Medicine

## 2018-02-25 ENCOUNTER — Encounter (HOSPITAL_COMMUNITY): Payer: Self-pay | Admitting: Internal Medicine

## 2018-02-25 ENCOUNTER — Other Ambulatory Visit: Payer: Self-pay

## 2018-02-25 VITALS — BP 111/90 | HR 66 | Temp 97.6°F | Resp 16 | Wt 146.4 lb

## 2018-02-25 DIAGNOSIS — Z923 Personal history of irradiation: Secondary | ICD-10-CM | POA: Diagnosis not present

## 2018-02-25 DIAGNOSIS — E039 Hypothyroidism, unspecified: Secondary | ICD-10-CM | POA: Diagnosis not present

## 2018-02-25 DIAGNOSIS — C109 Malignant neoplasm of oropharynx, unspecified: Secondary | ICD-10-CM | POA: Diagnosis not present

## 2018-02-25 DIAGNOSIS — R1310 Dysphagia, unspecified: Secondary | ICD-10-CM | POA: Diagnosis not present

## 2018-02-25 DIAGNOSIS — Z9221 Personal history of antineoplastic chemotherapy: Secondary | ICD-10-CM | POA: Diagnosis not present

## 2018-02-25 DIAGNOSIS — F17210 Nicotine dependence, cigarettes, uncomplicated: Secondary | ICD-10-CM | POA: Diagnosis not present

## 2018-02-25 DIAGNOSIS — R52 Pain, unspecified: Secondary | ICD-10-CM

## 2018-02-25 NOTE — Progress Notes (Signed)
Diagnosis No diagnosis found.  Staging Cancer Staging Oropharyngeal carcinoma ([place]) Staging form: Pharynx - Oropharynx, AJCC 7th Edition - Clinical: Stage IVA (T4a, N2b, M0) - Unsigned   Assessment and Plan:   1.  Oropharyngeal cancer.  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 S/P curative concomitant chemo/XRT with remission on PET imaging in October 2016. He was previously last seen at [facility] in 05/2016 and has failed to keep follow-up appointments.  He called in requesting pain medications.  Previously, he was instructed he would no longer be prescribed opiods by PA Kefalas.    Pt was recommended for PET scan but did not have imaging done.  He was set up for CT neck CAP that was done on 09/20/2017 and showed   IMPRESSION:  CT neck:  1. Limited assessment of the pharynx due to motion. 2. Decreased size of low-density left level II lymph node. No evidence of new or progressive cervical lymphadenopathy.  1. Interval development of bulky mediastinal adenopathy concerning for metastatic disease. 2. Interval development of pulmonary nodules within the left lower lobe concerning for the possibility of pulmonary metastatic disease. 3. No definite metastatic disease identified within the abdomen or pelvis. Multiple hepatic cysts are demonstrated.  Pt was referred to Dr. Benjamine Mola and was seen by him on 10/07/2017 and recommended PET/CT.  Pt did not have imaging done.  He has smoking history and reports that he quit smoking but started Vaping.    PET scan done 02/18/2018 reviewed and showed  IMPRESSION: 1. Interval development of hypermetabolic mediastinal and left hilar lymphadenopathy associated with small hypermetabolic left lung nodules. Metastatic disease a distinct consideration and left lung primary with metastatic disease to the left hilum and mediastinum not excluded. 2. Persistent hypermetabolic activity in the right  prostate gland. 3. Similar appearance of diffuse FDG uptake in the tongue and muscles of the left neck, likely related to movement.  Pt is referred to CT surgery for evaluation for EBUS/Bronch for tissue diagnosis of lung findings.  Further recommendations will follow-up once pathology reviewed.   2.  Dysphagia.  Pt was seen by GI and underwent EGD on 01/15/2018 and showed esophagitis, gastritis and hiatal hernia.  Pt was dilated.   He is again set up for PET scan for interval evaluation of scan findings.   3.  Pain.  Pt was previously referred to pain clinic for evaluation and management.    4.  RI.  Labs done 02/08/2018 reviewed and showed WBC 5.7 HB 13.7 plts 213,000.  Chemistries WNL with K+ 4.3 and normal LFTs.  Cr is elevated at 1.74.  Pt was previously referred to nephrology.  Unclear if he went to appointment. Will refer to nephrology.    5.  Hypothyroidism.  TSH noted to be elevated at 8.8 on labs done 08/10/2017.  He has not followed up with PCP or endocrinology.  Pt not on synthroid.  Refer to PCP for evaluation.    6.  Noncompliance. Pt has not followed up with Dr. Benjamine Mola or this office and has not had imaging done as previously recommended. Will recommend social work evaluation to assess deterrent to compliance.    7.  Smoking.  Pt reports he is no longer smoking cigarettes but he is vaping.  I have discussed with him potential risks of vaping as well as smoking.    25 minutes spent with more than 50% spent in counseling and coordination of care.  Interval History: Historical data obtained from note dated 08/14/2017.   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 S/P curative concomitant chemo/XRT with remission on PET imaging in October 2016.  Current Status:  Pt is seen today for follow-up.  He is here to go over PET scan.  When questioned, he reports he was Vaping.     Oropharyngeal carcinoma ([place])   07/27/2014 Imaging    CT neck- Advanced stage  oropharyngeal cancer with necrotic adenopathy accounting for the left neck swelling.    07/28/2014 Initial Diagnosis    Oropharyngeal cancer    08/03/2014 Imaging    CT CAP- L supraclavicular lymphadenopathy is not completely visualized. This is better seen on the previous neck CT from 07/27/2014. Otherwise, no evidence for metastatic disease in the chest, abdomen, or pelvis.    08/03/2014 Imaging    Bone scan- Uptake at adjacent anterior LEFT 6, 7, 8 ribs likely representing trauma/fractures. Questionable nonspecific increased tracer localization at the posterior RIGHT 8th and 9th ribs, the adjacent nature which raises a a question of trauma as well    08/06/2014 Pathology Results    Dr. Benjamine Mola- Oropharynx, biopsy, Left - INVASIVE SQUAMOUS CELL CARCINOMA.    08/12/2014 Procedure    Dr. Enrique Sack- 1. Multiple extraction of tooth numbers 6, 17, 22, 23, 24, 25, 26, and 27. 3 Quadrants of alveoloplasty    08/17/2014 Pathology Results    PORT and G-TUBE placed by Dr. Carlis Stable.    08/26/2014 PET scan    Large hypermetabolic mass in the left base of tongue. Activity extends across midline to the right base tongue. 2. Intensely hypermetabolic left cervical metastatic lymph nodes. Lymph nodes extend from the left level II position to the left supraclavi    09/01/2014 - 09/22/2014 Chemotherapy    Concurrent chemoradiation with Cisplatin 100 mg/m2 x 2 cycles with Neulasta support. Held cycle #3 d/t renal toxicity.     09/03/2014 - 10/23/2014 Radiation Therapy    Treated in [place], IMRT Isidore Moos).  Base of tongue and bilat neck. Total dose: 70 Gy in 35 fractions. (of note, he did miss several treatments requiring BID dosing towards the end of treatment).     01/25/2015 PET scan    Near complete resolution of metabolic activity at the base of tongue. Minimal residual activity is likely post treatment effect. 2. Complete resolution of metabolic activity above LEFT cervical lymph nodes. No evidence of residual  metabolically active     4/33/2951 Procedure    Port-a-cath removed [name])       Problem List Patient Active Problem List   Diagnosis Date Noted  . Gastritis and gastroduodenitis [K29.70, K25.90]   . Dysphagia [R13.10] 11/09/2017  . History of oropharyngeal cancer [Z85.819] 11/09/2017  . Oropharyngeal carcinoma ([place]) [C10.9] 07/28/2014    Past Medical History Past Medical History:  Diagnosis Date  . 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 Oropharyngeal cancer ([place]) 07/28/2014   dx. 3 weeks ago.- Dr. Oneal [facility] [place], [place].  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 BIOPSY  01/15/2018   Procedure: BIOPSY;  Surgeon: Danie Binder, MD;  [facility];  Service: Endoscopy;;  gastric  . COLONOSCOPY N/A 03/13/2016   Procedure: COLONOSCOPY;  Surgeon: Danie Binder, MD;  [facility];  Service: Endoscopy;  Laterality: N/A;  2:15 PM  . ESOPHAGOGASTRODUODENOSCOPY (EGD) WITH PROPOFOL N/A 08/17/2014   Procedure: ESOPHAGOGASTRODUODENOSCOPY (EGD) WITH PROPOFOL (procedure #1);  Surgeon: Aviva Signs Md, MD;  Location: [facility];  Service: General;  Laterality: N/A;  . ESOPHAGOGASTRODUODENOSCOPY (EGD) WITH PROPOFOL N/A 01/15/2018   Procedure: ESOPHAGOGASTRODUODENOSCOPY (EGD) WITH PROPOFOL;  Surgeon: Danie Binder, MD;  [facility];  Service: Endoscopy;  Laterality: N/A;  9:30am  . MULTIPLE EXTRACTIONS WITH ALVEOLOPLASTY N/A 08/12/2014   Procedure: Extraction of tooth #'s 6,17,22,23,24,25,26,27 with alveoloplasty;  Surgeon: Lenn Cal, DDS;  Location: [facility];  Service: Oral Surgery;  Laterality: N/A;  . PANENDOSCOPY N/A 08/06/2014   Procedure: PANENDOSCOPY WITH BIOPSY;  Surgeon: Leta Baptist, MD;  Location: [place];  Service: ENT;  Laterality: N/A;  . PEG PLACEMENT Left  08/17/14  . PEG PLACEMENT N/A 08/17/2014   Procedure: PERCUTANEOUS ENDOSCOPIC GASTROSTOMY (PEG) PLACEMENT (procedure #1);  Surgeon: Aviva Signs Md, MD;  Location: [facility];  Service: General;  Laterality: N/A;  . PORT-A-CATH REMOVAL Right 07/17/2016   Procedure: MINOR REMOVAL PORT-A-CATH;  Surgeon: Aviva Signs, MD;  Location: [facility];  Service: General;  Laterality: Right;  . PORTACATH PLACEMENT Right 08/17/14  . PORTACATH PLACEMENT Right 08/17/2014   Procedure: INSERTION PORT-A-CATH (procedure #2);  Surgeon: Aviva Signs Md, MD;  Location: [facility];  Service: General;  Laterality: Right;  . SAVORY DILATION N/A 01/15/2018   Procedure: SAVORY DILATION;  Surgeon: Danie Binder, MD;  [facility];  Service: Endoscopy;  Laterality: N/A;    Family History Family History  Problem Relation Age of Onset  . Colon cancer Neg Hx   . Gastric cancer Neg Hx   . Esophageal cancer Neg Hx      Social History  reports that he quit smoking about 3 years ago. He has a 15.00 pack-year smoking history. He has never used smokeless tobacco. He reports that he does not drink alcohol or use drugs.  Medications  Current Outpatient Medications:  .  lactose free nutrition (BOOST) LIQD, Take 237 mLs by mouth daily., Disp: , Rfl:  .  omeprazole (PRILOSEC) 20 MG capsule, 1 PO 30 MINS PRIOR TO BREAKFAST., Disp: 90 capsule, Rfl: 3  Allergies Patient has no known allergies.  Review of Systems Review of Systems - Oncology ROS negative    Physical Exam  Vitals Wt Readings from Last 3 Encounters:  02/25/18 146 lb 6.4 oz (66.4 kg)  02/15/18 143 lb (64.9 kg)  01/15/18 140 lb (63.5 kg)   Temp Readings from Last 3 Encounters:  02/25/18 97.6 F (36.4 C) (Oral)  02/15/18 98.4 F (36.9 C) (Oral)  01/15/18 97.8 F (36.6 C) (Oral)   BP Readings from Last 3 Encounters:  02/25/18 111/90  02/15/18 104/60  01/15/18 112/63   Pulse Readings from Last 3 Encounters:  02/25/18 66  02/15/18 (!) 44  01/15/18  (!) 53   Constitutional: Well-developed, well-nourished, and in no distress.   HENT: Head: Normocephalic and atraumatic.  Mouth/Throat: No oropharyngeal exudate. Mucosa moist. Eyes: Pupils are equal, round, and reactive to light. Conjunctivae are normal. No scleral icterus.  Neck: Normal range of motion. Neck supple. No JVD present.  Cardiovascular: Normal rate, regular rhythm and normal heart sounds.  Exam reveals no gallop and no friction rub.   No murmur heard. Pulmonary/Chest: Effort normal and breath sounds normal. No respiratory distress. No wheezes.No rales.  Abdominal: Soft. Bowel sounds are normal. No distension. There is  no tenderness. There is no guarding.  Musculoskeletal: No edema or tenderness.  Lymphadenopathy: No cervical, axillary or supraclavicular adenopathy.  Neurological: Alert and oriented to person, place, and time. No cranial nerve deficit.  Skin: Skin is warm and dry. No rash noted. No erythema. No pallor.  Psychiatric: Affect and judgment normal.   Labs No visits with results within 3 Day(s) from this visit.  Latest known visit with results is:  Appointment on 02/08/2018  Component Date Value Ref Range Status  . WBC 02/08/2018 5.7  4.0 - 10.5 K/uL Final  . RBC 02/08/2018 4.56  4.22 - 5.81 MIL/uL Final  . Hemoglobin 02/08/2018 13.7  13.0 - 17.0 g/dL Final  . HCT 02/08/2018 43.3  39.0 - 52.0 % Final  . MCV 02/08/2018 95.0  80.0 - 100.0 fL Final  . MCH 02/08/2018 30.0  26.0 - 34.0 pg Final  . MCHC 02/08/2018 31.6  30.0 - 36.0 g/dL Final  . RDW 02/08/2018 15.0  11.5 - 15.5 % Final  . Platelets 02/08/2018 213  150 - 400 K/uL Final  . nRBC 02/08/2018 0.0  0.0 - 0.2 % Final  . Neutrophils Relative % 02/08/2018 71  % Final  . Neutro Abs 02/08/2018 4.1  1.7 - 7.7 K/uL Final  . Lymphocytes Relative 02/08/2018 16  % Final  . Lymphs Abs 02/08/2018 0.9  0.7 - 4.0 K/uL Final  . Monocytes Relative 02/08/2018 11  % Final  . Monocytes Absolute 02/08/2018 0.6  0.1 - 1.0  K/uL Final  . Eosinophils Relative 02/08/2018 2  % Final  . Eosinophils Absolute 02/08/2018 0.1  0.0 - 0.5 K/uL Final  . Basophils Relative 02/08/2018 0  % Final  . Basophils Absolute 02/08/2018 0.0  0.0 - 0.1 K/uL Final  . Immature Granulocytes 02/08/2018 0  % Final  . Abs Immature Granulocytes 02/08/2018 0.02  0.00 - 0.07 K/uL Final   Performed at [facility], [address], [place], [place] [address]  . Sodium 02/08/2018 141  135 - 145 mmol/L Final  . Potassium 02/08/2018 4.3  3.5 - 5.1 mmol/L Final  . Chloride 02/08/2018 106  98 - 111 mmol/L Final  . CO2 02/08/2018 27  22 - 32 mmol/L Final  . Glucose, Bld 02/08/2018 85  70 - 99 mg/dL Final  . BUN 02/08/2018 20  8 - 23 mg/dL Final  . Creatinine, Ser 02/08/2018 1.74* 0.61 - 1.24 mg/dL Final  . Calcium 02/08/2018 9.2  8.9 - 10.3 mg/dL Final  . Total Protein 02/08/2018 7.4  6.5 - 8.1 g/dL Final  . Albumin 02/08/2018 4.1  3.5 - 5.0 g/dL Final  . AST 02/08/2018 18  15 - 41 U/L Final  . ALT 02/08/2018 13  0 - 44 U/L Final  . Alkaline Phosphatase 02/08/2018 47  38 - 126 U/L Final  . Total Bilirubin 02/08/2018 0.7  0.3 - 1.2 mg/dL Final  . GFR calc non Af Amer 02/08/2018 39* >60 mL/min Final  . GFR calc Af Amer 02/08/2018 45* >60 mL/min Final   Comment: (NOTE) The eGFR has been calculated using the CKD EPI equation. This calculation has not been validated in all clinical situations. eGFR's persistently <60 mL/min signify possible Chronic Kidney Disease.   Georgiann Hahn gap 02/08/2018 8  5 - 15 Final   Performed at [facility], [address] [place], [place] [address]  . LDH 02/08/2018 124  98 - 192 U/L Final   Performed at [facility], [address], [place], [place] [address]  Pathology No orders of the defined types were placed in this encounter.      Zoila Shutter MD

## 2018-02-25 NOTE — Patient Instructions (Signed)
[facility]  Discharge Instructions: You saw Dr. Higgs today                               _______________________________________________________________  Thank you for choosing [facility] to provide your oncology and hematology care.  To afford each patient quality time with our providers, please arrive at least 15 minutes before your scheduled appointment.  You need to re-schedule your appointment if you arrive 10 or more minutes late.  We strive to give you quality time with our providers, and arriving late affects you and other patients whose appointments are after yours.  Also, if you no show three or more times for appointments you may be dismissed from the clinic.  Again, thank you for choosing [facility]. Our hope is that these requests will allow you access to exceptional care and in a timely manner. _______________________________________________________________  If you have questions after your visit, please contact our office at [phone] between the hours of 8:30 a.m. and 5:00 p.m. Voicemails left after 4:30 p.m. will not be returned until the following business day. _______________________________________________________________  For prescription refill requests, have your pharmacy contact our office. _______________________________________________________________  Recommendations made by the consultant and any test results will be sent to your referring physician. _______________________________________________________________ 

## 2018-03-06 ENCOUNTER — Ambulatory Visit (HOSPITAL_COMMUNITY): Payer: Self-pay | Admitting: Internal Medicine

## 2018-03-13 ENCOUNTER — Encounter: Payer: Self-pay | Admitting: Thoracic Surgery (Cardiothoracic Vascular Surgery)

## 2018-03-18 ENCOUNTER — Ambulatory Visit (HOSPITAL_COMMUNITY): Payer: Self-pay | Admitting: Internal Medicine

## 2018-04-01 ENCOUNTER — Encounter: Payer: Self-pay | Admitting: Thoracic Surgery (Cardiothoracic Vascular Surgery)

## 2018-04-02 ENCOUNTER — Telehealth (HOSPITAL_COMMUNITY): Payer: Self-pay | Admitting: General Practice

## 2018-04-02 NOTE — Telephone Encounter (Signed)
[facility] CSW Progress Notes  Request from nurse navigator to call patient to discuss need for help w transportation to appointments.  Patient has both [employer] Medicare (eff 03/2018) and Medicaid.  Pt has contacted Medicaid transport for tomorrow's appointment - was told he needs to give 5 days notice for transportation requests.  Can use this option for future appointments w sufficient notice.  Also has [facility], was given number for [employer] (also needs sufficient notice to schedule rides). Referred to [facility] - their volunteer drivers need more notice to be scheduled but agency can provide multiple avenues of support.  Can also be referred to [place] [facility], however they will not be able to transport for appt tomorrow in [place].  Discussed need to arrange transportation in advance - Medicaid transport will be able to transport to all appointments both in [facility] and [place].  Patient aware that he will need to make his own arrangements for tomorrow's appointment, will contact a friend to see if she is able to drive him.  No other transport options are readily available to him at this time due to the short notice.  Will mail information packet on [facility] cancer resources/supports.  Edwyna Shell, LCSW Clinical Social Worker Phone:  [phone]

## 2018-04-03 ENCOUNTER — Encounter: Payer: Medicare HMO | Admitting: Thoracic Surgery (Cardiothoracic Vascular Surgery)

## 2018-04-09 ENCOUNTER — Encounter: Payer: Self-pay | Admitting: Thoracic Surgery (Cardiothoracic Vascular Surgery)

## 2018-04-09 ENCOUNTER — Institutional Professional Consult (permissible substitution) (INDEPENDENT_AMBULATORY_CARE_PROVIDER_SITE_OTHER): Payer: Medicare HMO | Admitting: Thoracic Surgery (Cardiothoracic Vascular Surgery)

## 2018-04-09 ENCOUNTER — Other Ambulatory Visit: Payer: Self-pay

## 2018-04-09 ENCOUNTER — Other Ambulatory Visit: Payer: Self-pay | Admitting: *Deleted

## 2018-04-09 VITALS — BP 121/68 | HR 60 | Resp 18 | Ht 73.0 in | Wt 148.0 lb

## 2018-04-09 DIAGNOSIS — R918 Other nonspecific abnormal finding of lung field: Secondary | ICD-10-CM

## 2018-04-09 DIAGNOSIS — R590 Localized enlarged lymph nodes: Secondary | ICD-10-CM

## 2018-04-09 NOTE — Progress Notes (Signed)
PCP is Sasser, Silvestre Moment, MD Referring Provider is Higgs, Mathis Dad, MD  Chief Complaint  Patient presents with  . Lung Lesion    Surgical eval, PET [place] 02/18/18, CT Neck/Chest/ABD 09/21/17    HPI: Alexander [name] is sent for evaluation for possible endobronchial ultrasound.  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  He was treated with chemo and radiation in 2016.  He has been in remission since then.  He recently had a follow-up CT which showed multiple left lung nodules and mediastinal and hilar adenopathy.  A PET/CT was done which showed the nodules and lymph nodes were hypermetabolic.  He was having difficulty swallowing but had an esophageal dilatation couple of months ago.  He denies any shortness of breath, cough, hemoptysis, wheezing, change in appetite, weight loss, headaches, or visual changes.  Zubrod Score: At the time of surgery this patient's most appropriate activity status/level should be described as: [x]     0    Normal activity, no symptoms []     1    Restricted in physical strenuous activity but ambulatory, able to do out light work []     2    Ambulatory and capable of self care, unable to do work activities, up and about >50 % of waking hours                              []     3    Only limited self care, in bed greater than 50% of waking hours []     4    Completely disabled, no self care, confined to bed or chair []     5    Moribund  Past Medical History:  Diagnosis Date  . 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 Oropharyngeal cancer ([place]) 07/28/2014   dx. 3 weeks ago.- Dr. [facility]al [facility] [place], [place].  Marland Kitchen 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[place]) 08/06/14   SCCa of Left BOT    Past Surgical History:  Procedure Laterality Date  . BIOPSY  01/15/2018   Procedure: BIOPSY;  Surgeon: Danie Binder, MD;  [facility];  Service: Endoscopy;;  gastric  . COLONOSCOPY N/A 03/13/2016   Procedure: COLONOSCOPY;  Surgeon: Danie Binder, MD;  [facility];  Service: Endoscopy;  Laterality: N/A;  2:15 PM  . ESOPHAGOGASTRODUODENOSCOPY (EGD) WITH PROPOFOL N/A 08/17/2014   Procedure: ESOPHAGOGASTRODUODENOSCOPY (EGD) WITH PROPOFOL (procedure #1);  Surgeon: Aviva Signs Md, MD;  [facility]: [facility];  Service: General;  Laterality: N/A;  . ESOPHAGOGASTRODUODENOSCOPY (EGD) WITH PROPOFOL N/A 01/15/2018   Procedure: ESOPHAGOGASTRODUODENOSCOPY (EGD) WITH PROPOFOL;  Surgeon: Danie Binder, MD;  [facility];  Service: Endoscopy;  Laterality: N/A;  9:30am  . MULTIPLE EXTRACTIONS WITH ALVEOLOPLASTY N/A 08/12/2014   Procedure: Extraction of tooth #'s 6,17,22,23,24,25,26,27 with alveoloplasty;  Surgeon: Lenn Cal, DDS;  [facility];  Service: Oral Surgery;  Laterality: N/A;  . PANENDOSCOPY N/A 08/06/2014   Procedure: PANENDOSCOPY WITH BIOPSY;  Surgeon: Leta Baptist, MD;  Location: [place];  Service: ENT;  Laterality: N/A;  . PEG PLACEMENT Left 08/17/14  . PEG PLACEMENT N/A 08/17/2014   Procedure: PERCUTANEOUS ENDOSCOPIC GASTROSTOMY (PEG) PLACEMENT (procedure #1);  Surgeon: Aviva Signs Md, MD;  [facility];  Service: General;  Laterality: N/A;  . PORT-A-CATH REMOVAL Right 07/17/2016   Procedure: MINOR REMOVAL PORT-A-CATH;  Surgeon: Aviva Signs, MD;  [facility];  Service: General;  Laterality: Right;  . PORTACATH PLACEMENT Right 08/17/14  . PORTACATH PLACEMENT Right 08/17/2014   Procedure: INSERTION PORT-A-CATH (procedure #2);  Surgeon: Aviva Signs Md, MD;  [facility];  Service: General;  Laterality: Right;  . SAVORY DILATION N/A 01/15/2018   Procedure: SAVORY DILATION;  Surgeon: Danie Binder, MD;  [facility];  Service: Endoscopy;  Laterality: N/A;    Family History  Problem Relation Age of Onset  . Colon cancer Neg Hx   . Gastric cancer Neg Hx    . Esophageal cancer Neg Hx     Social History Social History   Tobacco Use  . Smoking status: Former Smoker    Packs/day: 0.50    Years: 30.00    Pack years: 15.00    Last attempt to quit: 07/22/2014    Years since quitting: 3.7  . Smokeless tobacco: Never Used  Substance Use Topics  . Alcohol use: No    Alcohol/week: 0.0 standard drinks    Comment: None currently (11/09/17); previously 1-2 beers on the weekend  . Drug use: No    Current Outpatient Medications  Medication Sig Dispense Refill  . lactose free nutrition (BOOST) LIQD Take 237 mLs by mouth daily.    Marland Kitchen omeprazole (PRILOSEC) 20 MG capsule 1 PO 30 MINS PRIOR TO BREAKFAST. 90 capsule 3   No current facility-administered medications for this visit.     No Known Allergies  Review of Systems  Constitutional: Negative for activity change, appetite change and unexpected weight change.  HENT: Positive for trouble swallowing (Improved after dilatation). Negative for voice change.   Eyes: Negative for visual disturbance.  Respiratory: Negative for cough and shortness of breath.   Cardiovascular: Negative for chest pain and leg swelling.  Gastrointestinal: Negative for abdominal distention and abdominal pain.  Genitourinary: Negative for difficulty urinating and dysuria.  Musculoskeletal: Negative for arthralgias and myalgias.  Neurological: Negative for dizziness, weakness and headaches.  Hematological: Negative for adenopathy. Does not bruise/bleed easily.  All other systems reviewed and are negative.   BP 121/68 (BP Location: Right Arm, Patient Position: Sitting, Cuff Size: Normal)   Pulse 60   Resp 18   Ht 6\' 1"  (1.854 m)   Wt 148 lb (67.1 kg)   SpO2 98% Comment: RA  BMI 19.53 kg/m  Physical Exam Vitals signs reviewed.  Constitutional:      General: He is not in acute distress. HENT:     Head: Normocephalic and atraumatic.  Eyes:     General: No scleral icterus.    Extraocular Movements: Extraocular  movements intact.  Neck:     Musculoskeletal: Neck supple.  Cardiovascular:     Rate and Rhythm: Normal rate and regular rhythm.     Heart sounds: No murmur. No friction rub. No gallop.   Pulmonary:     Effort: Pulmonary effort is normal. No respiratory distress.     Breath sounds: Wheezing (Left greater than right) present.  Abdominal:     General: There is no distension.     Palpations: Abdomen is soft.     Tenderness: There is no abdominal tenderness.  Musculoskeletal:        General: No swelling.  Lymphadenopathy:     Cervical: No cervical adenopathy.  Skin:    General: Skin is warm and dry.  Neurological:  General: No focal deficit present.     Mental Status: He is alert and oriented to person, place, and time.     Cranial Nerves: No cranial nerve deficit.     Coordination: Coordination normal.    Diagnostic Tests: NUCLEAR MEDICINE PET SKULL BASE TO THIGH  TECHNIQUE: 8.2 mCi F-18 FDG was injected intravenously. Full-ring PET imaging was performed from the skull base to thigh after the radiotracer. CT data was obtained and used for attenuation correction and anatomic localization.  Fasting blood glucose: 70 mg/dl  COMPARISON:  01/25/2015  FINDINGS: Mediastinal blood pool activity: SUV max 1.8  NECK: Similar appearance of diffuse FDG uptake in the tongue. No hypermetabolic lymphadenopathy in the neck.  Incidental CT findings: none  CHEST: Hypermetabolic lymphadenopathy identified in the mediastinum. 18 mm short axis AP window lymph node demonstrates SUV max = 5.4 (103/3). 15 mm short axis right paratracheal node (91/3) demonstrates SUV max = 6.8. Hypermetabolism in the left hilum identified with SUV max = 6.3. 2.2 cm short axis subcarinal lymph node is hypermetabolic.  11 mm left lower lobe nodule (782/9) is hypermetabolic with SUV max = 4.4. Adjacent 10 mm left lower lobe nodule (134/3) demonstrates SUV max = 2.6. Irregular 9 mm left upper lobe  nodule (56/2) is hypermetabolic with SUV max = 2.7.  Incidental CT findings: Coronary artery calcification is evident. Atherosclerotic calcification is noted in the wall of the thoracic aorta. Centrilobular and paraseptal emphysema noted in the lungs bilaterally.  ABDOMEN/PELVIS: No abnormal hypermetabolic activity within the liver, pancreas, adrenal glands, or spleen. No hypermetabolic lymph nodes in the abdomen or pelvis.  Intense hypermetabolism again noted in the right aspect of the prostate gland.  Incidental CT findings: There is abdominal aortic atherosclerosis without aneurysm. Small periumbilical and umbilical hernias contain only fat.  SKELETON: No focal hypermetabolic activity to suggest skeletal metastasis.  Incidental CT findings: No worrisome lytic or sclerotic osseous abnormality.  IMPRESSION: 1. Interval development of hypermetabolic mediastinal and left hilar lymphadenopathy associated with small hypermetabolic left lung nodules. Metastatic disease a distinct consideration and left lung primary with metastatic disease to the left hilum and mediastinum not excluded. 2. Persistent hypermetabolic activity in the right prostate gland. 3. Similar appearance of diffuse FDG uptake in the tongue and muscles of the left neck, likely related to movement.   Electronically Signed   By: Misty Stanley M.D.   On: 02/19/2018 09:26 I personally reviewed the CT and PET/CT images and concur with the findings noted above  Impression: Alexander [name] is a 68 year old man with a history of tobacco abuse and stage IV a squamous cell carcinoma of the base of the tongue.  He now has markedly enlarged mediastinal and hilar lymph nodes and multiple lung nodules.  Those are all hypermetabolic on PET/CT.  This almost certainly represents malignancy.  Differential diagnosis includes new primary lung cancer versus recurrent squamous cell carcinoma.  He needs a tissue diagnosis in  order to guide therapy.  I recommended that we do a bronchoscopy and endobronchial ultrasound for diagnostic purposes.  He understands this will not be therapeutic.  We will plan to do this in the operating room, under general anesthesia, and on an outpatient basis.  I described the general nature of the procedure to him.  He understands the indications, risk, benefits, and alternatives.  He understands the risk include those associated with general anesthesia.  He understands seizure specific risk include bleeding, pneumothorax, and failure to make a diagnosis.  He accepts the  risk and agrees to proceed.  Plan: Bronchoscopy and endobronchial ultrasound on Monday, 04/15/2018  Melrose Nakayama, MD [facility] [phone]

## 2018-04-09 NOTE — H&P (View-Only) (Signed)
PCP is Sasser, Silvestre Moment, MD Referring Provider is Higgs, Mathis Dad, MD  Chief Complaint  Patient presents with  . Lung Lesion    Surgical eval, PET [place] 02/18/18, CT Neck/Chest/ABD 09/21/17    HPI: Mr. Labell is sent for evaluation for possible endobronchial ultrasound.  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  He was treated with chemo and radiation in 2016.  He has been in remission since then.  He recently had a follow-up CT which showed multiple left lung nodules and mediastinal and hilar adenopathy.  A PET/CT was done which showed the nodules and lymph nodes were hypermetabolic.  He was having difficulty swallowing but had an esophageal dilatation couple of months ago.  He denies any shortness of breath, cough, hemoptysis, wheezing, change in appetite, weight loss, headaches, or visual changes.  Zubrod Score: At the time of surgery this patient's most appropriate activity status/level should be described as: [x]     0    Normal activity, no symptoms []     1    Restricted in physical strenuous activity but ambulatory, able to do out light work []     2    Ambulatory and capable of self care, unable to do work activities, up and about >50 % of waking hours                              []     3    Only limited self care, in bed greater than 50% of waking hours []     4    Completely disabled, no self care, confined to bed or chair []     5    Moribund  Past Medical History:  Diagnosis Date  . 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 Oropharyngeal cancer ([place]) 07/28/2014   dx. 3 weeks ago.- Dr. One[facility] [place], [place].  Marland Kitchen 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[place]) 08/06/14   SCCa of Left BOT    Past Surgical History:  Procedure Laterality Date  . BIOPSY  01/15/2018   Procedure: BIOPSY;  Surgeon: Danie Binder, MD;  [facility];  Service: Endoscopy;;  gastric  . COLONOSCOPY N/A 03/13/2016   Procedure: COLONOSCOPY;  Surgeon: Danie Binder, MD;  [facility];  Service: Endoscopy;  Laterality: N/A;  2:15 PM  . ESOPHAGOGASTRODUODENOSCOPY (EGD) WITH PROPOFOL N/A 08/17/2014   Procedure: ESOPHAGOGASTRODUODENOSCOPY (EGD) WITH PROPOFOL (procedure #1);  Surgeon: Aviva Signs Md, MD;  [facility];  Service: General;  Laterality: N/A;  . ESOPHAGOGASTRODUODENOSCOPY (EGD) WITH PROPOFOL N/A 01/15/2018   Procedure: ESOPHAGOGASTRODUODENOSCOPY (EGD) WITH PROPOFOL;  Surgeon: Danie Binder, MD;  [facility];  Service: Endoscopy;  Laterality: N/A;  9:30am  . MULTIPLE EXTRACTIONS WITH ALVEOLOPLASTY N/A 08/12/2014   Procedure: Extraction of tooth #'s 6,17,22,23,24,25,26,27 with alveoloplasty;  Surgeon: Lenn Cal, DDS;  [facility];  Service: Oral Surgery;  Laterality: N/A;  . PANENDOSCOPY N/A 08/06/2014   Procedure: PANENDOSCOPY WITH BIOPSY;  Surgeon: Leta Baptist, MD;  Location: [facility];  Service: ENT;  Laterality: N/A;  . PEG PLACEMENT Left 08/17/14  . PEG PLACEMENT N/A 08/17/2014   Procedure: PERCUTANEOUS ENDOSCOPIC GASTROSTOMY (PEG) PLACEMENT (procedure #1);  Surgeon: Aviva Signs Md, MD;  [facility];  Service: General;  Laterality: N/A;  . PORT-A-CATH REMOVAL Right 07/17/2016   Procedure: MINOR REMOVAL PORT-A-CATH;  Surgeon: Aviva Signs, MD;  [facility];  Service: General;  Laterality: Right;  . PORTACATH PLACEMENT Right 08/17/14  . PORTACATH PLACEMENT Right 08/17/2014   Procedure: INSERTION PORT-A-CATH (procedure #2);  Surgeon: Aviva Signs Md, MD;  [facility];  Service: General;  Laterality: Right;  . SAVORY DILATION N/A 01/15/2018   Procedure: SAVORY DILATION;  Surgeon: Danie Binder, MD;  [facility];  Service: Endoscopy;  Laterality: N/A;    Family History  Problem Relation Age of Onset  . Colon cancer Neg Hx   . Gastric cancer Neg Hx    . Esophageal cancer Neg Hx     Social History Social History   Tobacco Use  . Smoking status: Former Smoker    Packs/day: 0.50    Years: 30.00    Pack years: 15.00    Last attempt to quit: 07/22/2014    Years since quitting: 3.7  . Smokeless tobacco: Never Used  Substance Use Topics  . Alcohol use: No    Alcohol/week: 0.0 standard drinks    Comment: None currently (11/09/17); previously 1-2 beers on the weekend  . Drug use: No    Current Outpatient Medications  Medication Sig Dispense Refill  . lactose free nutrition (BOOST) LIQD Take 237 mLs by mouth daily.    Marland Kitchen omeprazole (PRILOSEC) 20 MG capsule 1 PO 30 MINS PRIOR TO BREAKFAST. 90 capsule 3   No current facility-administered medications for this visit.     No Known Allergies  Review of Systems  Constitutional: Negative for activity change, appetite change and unexpected weight change.  HENT: Positive for trouble swallowing (Improved after dilatation). Negative for voice change.   Eyes: Negative for visual disturbance.  Respiratory: Negative for cough and shortness of breath.   Cardiovascular: Negative for chest pain and leg swelling.  Gastrointestinal: Negative for abdominal distention and abdominal pain.  Genitourinary: Negative for difficulty urinating and dysuria.  Musculoskeletal: Negative for arthralgias and myalgias.  Neurological: Negative for dizziness, weakness and headaches.  Hematological: Negative for adenopathy. Does not bruise/bleed easily.  All other systems reviewed and are negative.   BP 121/68 (BP Location: Right Arm, Patient Position: Sitting, Cuff Size: Normal)   Pulse 60   Resp 18   Ht 6\' 1"  (1.854 m)   Wt 148 lb (67.1 kg)   SpO2 98% Comment: RA  BMI 19.53 kg/m  Physical Exam Vitals signs reviewed.  Constitutional:      General: He is not in acute distress. HENT:     Head: Normocephalic and atraumatic.  Eyes:     General: No scleral icterus.    Extraocular Movements: Extraocular  movements intact.  Neck:     Musculoskeletal: Neck supple.  Cardiovascular:     Rate and Rhythm: Normal rate and regular rhythm.     Heart sounds: No murmur. No friction rub. No gallop.   Pulmonary:     Effort: Pulmonary effort is normal. No respiratory distress.     Breath sounds: Wheezing (Left greater than right) present.  Abdominal:     General: There is no distension.     Palpations: Abdomen is soft.     Tenderness: There is no abdominal tenderness.  Musculoskeletal:        General: No swelling.  Lymphadenopathy:     Cervical: No cervical adenopathy.  Skin:    General: Skin is warm and dry.  Neurological:  General: No focal deficit present.     Mental Status: He is alert and oriented to person, place, and time.     Cranial Nerves: No cranial nerve deficit.     Coordination: Coordination normal.    Diagnostic Tests: NUCLEAR MEDICINE PET SKULL BASE TO THIGH  TECHNIQUE: 8.2 mCi F-18 FDG was injected intravenously. Full-ring PET imaging was performed from the skull base to thigh after the radiotracer. CT data was obtained and used for attenuation correction and anatomic localization.  Fasting blood glucose: 70 mg/dl  COMPARISON:  01/25/2015  FINDINGS: Mediastinal blood pool activity: SUV max 1.8  NECK: Similar appearance of diffuse FDG uptake in the tongue. No hypermetabolic lymphadenopathy in the neck.  Incidental CT findings: none  CHEST: Hypermetabolic lymphadenopathy identified in the mediastinum. 18 mm short axis AP window lymph node demonstrates SUV max = 5.4 (103/3). 15 mm short axis right paratracheal node (91/3) demonstrates SUV max = 6.8. Hypermetabolism in the left hilum identified with SUV max = 6.3. 2.2 cm short axis subcarinal lymph node is hypermetabolic.  11 mm left lower lobe nodule (233/0) is hypermetabolic with SUV max = 4.4. Adjacent 10 mm left lower lobe nodule (134/3) demonstrates SUV max = 2.6. Irregular 9 mm left upper lobe  nodule (07/6) is hypermetabolic with SUV max = 2.7.  Incidental CT findings: Coronary artery calcification is evident. Atherosclerotic calcification is noted in the wall of the thoracic aorta. Centrilobular and paraseptal emphysema noted in the lungs bilaterally.  ABDOMEN/PELVIS: No abnormal hypermetabolic activity within the liver, pancreas, adrenal glands, or spleen. No hypermetabolic lymph nodes in the abdomen or pelvis.  Intense hypermetabolism again noted in the right aspect of the prostate gland.  Incidental CT findings: There is abdominal aortic atherosclerosis without aneurysm. Small periumbilical and umbilical hernias contain only fat.  SKELETON: No focal hypermetabolic activity to suggest skeletal metastasis.  Incidental CT findings: No worrisome lytic or sclerotic osseous abnormality.  IMPRESSION: 1. Interval development of hypermetabolic mediastinal and left hilar lymphadenopathy associated with small hypermetabolic left lung nodules. Metastatic disease a distinct consideration and left lung primary with metastatic disease to the left hilum and mediastinum not excluded. 2. Persistent hypermetabolic activity in the right prostate gland. 3. Similar appearance of diffuse FDG uptake in the tongue and muscles of the left neck, likely related to movement.   Electronically Signed   By: Misty Stanley M.D.   On: 02/19/2018 09:26 I personally reviewed the CT and PET/CT images and concur with the findings noted above  Impression: Mr. Runk is a 68 year old man with a history of tobacco abuse and stage IV a squamous cell carcinoma of the base of the tongue.  He now has markedly enlarged mediastinal and hilar lymph nodes and multiple lung nodules.  Those are all hypermetabolic on PET/CT.  This almost certainly represents malignancy.  Differential diagnosis includes new primary lung cancer versus recurrent squamous cell carcinoma.  He needs a tissue diagnosis in  order to guide therapy.  I recommended that we do a bronchoscopy and endobronchial ultrasound for diagnostic purposes.  He understands this will not be therapeutic.  We will plan to do this in the operating room, under general anesthesia, and on an outpatient basis.  I described the general nature of the procedure to him.  He understands the indications, risk, benefits, and alternatives.  He understands the risk include those associated with general anesthesia.  He understands seizure specific risk include bleeding, pneumothorax, and failure to make a diagnosis.  He accepts the  risk and agrees to proceed.  Plan: Bronchoscopy and endobronchial ultrasound on Monday, 04/15/2018  Melrose Nakayama, MD [facility] [phone]

## 2018-04-12 ENCOUNTER — Encounter (HOSPITAL_COMMUNITY): Payer: Self-pay | Admitting: *Deleted

## 2018-04-12 ENCOUNTER — Other Ambulatory Visit: Payer: Self-pay

## 2018-04-12 NOTE — Anesthesia Preprocedure Evaluation (Addendum)
Anesthesia Evaluation  Patient identified by MRN, date of birth, ID band Patient awake    Reviewed: Allergy & Precautions, H&P , NPO status , Patient's Chart, lab work & pertinent test results  Airway Mallampati: II  TM Distance: >3 FB Neck ROM: Full    Dental no notable dental hx. (+) Teeth Intact, Dental Advisory Given   Pulmonary neg pulmonary ROS, former smoker,    Pulmonary exam normal breath sounds clear to auscultation       Cardiovascular negative cardio ROS Normal cardiovascular exam Rhythm:Regular Rate:Normal     Neuro/Psych negative neurological ROS     GI/Hepatic Neg liver ROS, GERD  ,  Endo/Other  negative endocrine ROS  Renal/GU negative Renal ROS     Musculoskeletal negative musculoskeletal ROS (+)   Abdominal   Peds  Hematology negative hematology ROS (+)   Anesthesia Other Findings   Reproductive/Obstetrics                           Anesthesia Physical Anesthesia Plan  ASA: III  Anesthesia Plan: General   Post-op Pain Management:    Induction: Intravenous  PONV Risk Score and Plan: 3 and Ondansetron, Dexamethasone and Treatment may vary due to age or medical condition  Airway Management Planned: Video Laryngoscope Planned and Oral ETT  Additional Equipment: Arterial line  Intra-op Plan:   Post-operative Plan: Extubation in OR  Informed Consent: I have reviewed the patients History and Physical, chart, labs and discussed the procedure including the risks, benefits and alternatives for the proposed anesthesia with the patient or authorized representative who has indicated his/her understanding and acceptance.     Dental advisory given  Plan Discussed with:   Anesthesia Plan Comments: (Same day work-up. PAT note written 04/12/2018 by Myra Gianotti, PA-C. )      Anesthesia Quick Evaluation

## 2018-04-12 NOTE — Progress Notes (Addendum)
Anesthesia Chart Review: Alexander [name]   Case:  [id number] Date/Time:  04/15/18 1112   Procedure:  VIDEO BRONCHOSCOPY WITH ENDOBRONCHIAL ULTRASOUND (N/A )   Anesthesia type:  General   Pre-op diagnosis:  MEDIASTINAL AND HILAR ADENOPATHY   Location:  [facility] OR ROOM 10 / [facility]   Surgeon:  Melrose Nakayama, MD      DISCUSSION: Patient is a 68 year old male scheduled for the above procedure. Known SCC of the tongue s/p chemoradiation in 2016. Recent follow-up CT showed multiple lung nodules and mediastinal and hilar adenopathy that were hypermetabolic by PET. The above procedure recommended.  History includes former smoker (quit '16; reported vaping per 02/25/18 oncology note), oropharyngeal cancer/SCC tongue (stage IVa [id number] diagnosed 07/2014; Port-a-cath, PEG 08/17/14, s/p chemoradiation with remission on PET imaging 12/2014, PAC removed 07/17/16). Although CKD is not listed on his history, labs starting on 09/22/14 have shown an elevated Cr ranging from 1.50-2.29 with labs since 08/10/17 -02/08/18 (in Epic) ranging from 1.74-2.19.  Dr. Roxan Hockey classified patient's Zubrod Score of 0 (normal activity, no symptoms).  Labs suggest CKD since at least mid 2016. Will attempt to get last office note and labs from his PCP Dr. Quintin Alto. (Piru 04/12/18 4:15 PM: Response from Dr. Edythe Lynn office is that patient is not currently seen at [facility].) Patient will need repeat labs prior to surgery, and if results stable then I would anticipate that he can proceed as planned.   PROVIDERS: Sasser, Silvestre Moment, MD is listed as PCP ([facility].)  Higgs, Mathis Dad, MD is HEM-ONC Leta Baptist, MD is ENT   LABS: He is for labs on the day of surgery.   IMAGES: PET scan 02/18/18: IMPRESSION: 1. Interval development of hypermetabolic mediastinal and left hilar lymphadenopathy associated with small hypermetabolic left lung nodules. Metastatic disease a distinct consideration and left  lung primary with metastatic disease to the left hilum and mediastinum not excluded. 2. Persistent hypermetabolic activity in the right prostate gland. 3. Similar appearance of diffuse FDG uptake in the tongue and muscles of the left neck, likely related to movement.  CT soft tissue neck/chest/abd/pelvis 09/20/17: IMPRESSION: 1. Interval development of bulky mediastinal adenopathy concerning for metastatic disease. 2. Interval development of pulmonary nodules within the left lower lobe concerning for the possibility of pulmonary metastatic disease. 3. No definite metastatic disease identified within the abdomen or pelvis. Multiple hepatic cysts are demonstrated.   EKG: 01/08/18: NSR, biatrial enlargement, right superior axis deviation, pulmonary disease patter, RVH.    CV: N/A   Past Medical History:  Diagnosis Date  . 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 Oropharyngeal cancer ([place]) 07/28/2014   dx. 3 weeks ago.- Dr. Oneal Deputy center [place], [place].  Marland Kitchen 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[place]) 08/06/14   SCCa of Left BOT    Past Surgical History:  Procedure Laterality Date  . BIOPSY  01/15/2018   Procedure: BIOPSY;  Surgeon: Danie Binder, MD;  Location: [facility];  Service: Endoscopy;;  gastric  . COLONOSCOPY N/A 03/13/2016   Procedure: COLONOSCOPY;  Surgeon: Danie Binder, MD;  [facility]: [facility];  Service: Endoscopy;  Laterality: N/A;  2:15 PM  . ESOPHAGOGASTRODUODENOSCOPY (EGD) WITH PROPOFOL N/A 08/17/2014   Procedure: ESOPHAGOGASTRODUODENOSCOPY (EGD) WITH PROPOFOL (procedure #1);  Surgeon: Aviva Signs Md, MD;  Location: [facility]S;  Service: General;  Laterality: N/A;  . ESOPHAGOGASTRODUODENOSCOPY (EGD) WITH PROPOFOL N/A 01/15/2018  Procedure: ESOPHAGOGASTRODUODENOSCOPY (EGD) WITH PROPOFOL;  Surgeon: Danie Binder, MD;  Location: [facility];  Service: Endoscopy;  Laterality: N/A;  9:30am  . MULTIPLE  EXTRACTIONS WITH ALVEOLOPLASTY N/A 08/12/2014   Procedure: Extraction of tooth #'s 6,17,22,23,24,25,26,27 with alveoloplasty;  Surgeon: Lenn Cal, DDS;  Location: [facility];  Service: Oral Surgery;  Laterality: N/A;  . PANENDOSCOPY N/A 08/06/2014   Procedure: PANENDOSCOPY WITH BIOPSY;  Surgeon: Leta Baptist, MD;  Location: [facility];  Service: ENT;  Laterality: N/A;  . PEG PLACEMENT Left 08/17/14  . PEG PLACEMENT N/A 08/17/2014   Procedure: PERCUTANEOUS ENDOSCOPIC GASTROSTOMY (PEG) PLACEMENT (procedure #1);  Surgeon: Aviva Signs Md, MD;  Location: [facility];  Service: General;  Laterality: N/A;  . PORT-A-CATH REMOVAL Right 07/17/2016   Procedure: MINOR REMOVAL PORT-A-CATH;  Surgeon: Aviva Signs, MD;  Location: [facility];  Service: General;  Laterality: Right;  . PORTACATH PLACEMENT Right 08/17/14  . PORTACATH PLACEMENT Right 08/17/2014   Procedure: INSERTION PORT-A-CATH (procedure #2);  Surgeon: Aviva Signs Md, MD;  Location: [facility];  Service: General;  Laterality: Right;  . SAVORY DILATION N/A 01/15/2018   Procedure: SAVORY DILATION;  Surgeon: Danie Binder, MD;  [facility];  Service: Endoscopy;  Laterality: N/A;    MEDICATIONS: No current facility-administered medications for this encounter.    . feeding supplement, ENSURE ENLIVE, (ENSURE ENLIVE) LIQD  . omeprazole (PRILOSEC) 20 MG capsule    Myra Gianotti, PA-C Surgical Short Stay/Anesthesiology [facility] [place] Phone [phone] [facility] Phone [phone] 04/12/2018 11:56 AM

## 2018-04-12 NOTE — Progress Notes (Signed)
Spoke with pt for pre-op call. Pt denies cardiac history, HTN or diabetes.

## 2018-04-15 ENCOUNTER — Encounter (HOSPITAL_COMMUNITY)
Admission: RE | Disposition: A | Discharge status: Home / Self Care | Payer: Self-pay | Source: Home / Self Care | Attending: Thoracic Surgery (Cardiothoracic Vascular Surgery)

## 2018-04-15 ENCOUNTER — Ambulatory Visit (HOSPITAL_COMMUNITY): Payer: Medicare HMO | Admitting: Vascular Surgery

## 2018-04-15 ENCOUNTER — Other Ambulatory Visit: Payer: Self-pay

## 2018-04-15 ENCOUNTER — Ambulatory Visit (HOSPITAL_COMMUNITY)
Admission: RE | Admit: 2018-04-15 | Discharge: 2018-04-15 | Disposition: A | Discharge status: Home / Self Care | Payer: Medicare HMO | Attending: Thoracic Surgery (Cardiothoracic Vascular Surgery) | Admitting: Thoracic Surgery (Cardiothoracic Vascular Surgery)

## 2018-04-15 ENCOUNTER — Encounter (HOSPITAL_COMMUNITY): Payer: Self-pay | Admitting: *Deleted

## 2018-04-15 ENCOUNTER — Ambulatory Visit (HOSPITAL_COMMUNITY): Payer: Medicare HMO

## 2018-04-15 DIAGNOSIS — C7802 Secondary malignant neoplasm of left lung: Secondary | ICD-10-CM | POA: Insufficient documentation

## 2018-04-15 DIAGNOSIS — R590 Localized enlarged lymph nodes: Secondary | ICD-10-CM

## 2018-04-15 DIAGNOSIS — Z79899 Other long term (current) drug therapy: Secondary | ICD-10-CM | POA: Diagnosis not present

## 2018-04-15 DIAGNOSIS — Z87891 Personal history of nicotine dependence: Secondary | ICD-10-CM | POA: Diagnosis not present

## 2018-04-15 DIAGNOSIS — I700 Atherosclerosis of aorta: Secondary | ICD-10-CM | POA: Insufficient documentation

## 2018-04-15 DIAGNOSIS — Z923 Personal history of irradiation: Secondary | ICD-10-CM | POA: Diagnosis not present

## 2018-04-15 DIAGNOSIS — Z9221 Personal history of antineoplastic chemotherapy: Secondary | ICD-10-CM | POA: Insufficient documentation

## 2018-04-15 DIAGNOSIS — K219 Gastro-esophageal reflux disease without esophagitis: Secondary | ICD-10-CM | POA: Insufficient documentation

## 2018-04-15 DIAGNOSIS — Z85810 Personal history of malignant neoplasm of tongue: Secondary | ICD-10-CM | POA: Insufficient documentation

## 2018-04-15 DIAGNOSIS — K7689 Other specified diseases of liver: Secondary | ICD-10-CM | POA: Insufficient documentation

## 2018-04-15 DIAGNOSIS — C801 Malignant (primary) neoplasm, unspecified: Secondary | ICD-10-CM | POA: Diagnosis not present

## 2018-04-15 HISTORY — DX: Gastro-esophageal reflux disease without esophagitis: K21.9

## 2018-04-15 HISTORY — PX: VIDEO BRONCHOSCOPY WITH ENDOBRONCHIAL ULTRASOUND: SHX6177

## 2018-04-15 LAB — CBC
HCT: 46 % (ref 39.0–52.0)
Hemoglobin: 14.4 g/dL (ref 13.0–17.0)
MCH: 29.6 pg (ref 26.0–34.0)
MCHC: 31.3 g/dL (ref 30.0–36.0)
MCV: 94.5 fL (ref 80.0–100.0)
PLATELETS: 204 10*3/uL (ref 150–400)
RBC: 4.87 MIL/uL (ref 4.22–5.81)
RDW: 14.2 % (ref 11.5–15.5)
WBC: 4.2 10*3/uL (ref 4.0–10.5)
nRBC: 0 % (ref 0.0–0.2)

## 2018-04-15 LAB — COMPREHENSIVE METABOLIC PANEL
ALT: 13 U/L (ref 0–44)
AST: 17 U/L (ref 15–41)
Albumin: 4.1 g/dL (ref 3.5–5.0)
Alkaline Phosphatase: 51 U/L (ref 38–126)
Anion gap: 14 (ref 5–15)
BUN: 21 mg/dL (ref 8–23)
CO2: 22 mmol/L (ref 22–32)
Calcium: 9.4 mg/dL (ref 8.9–10.3)
Chloride: 105 mmol/L (ref 98–111)
Creatinine, Ser: 1.9 mg/dL — ABNORMAL HIGH (ref 0.61–1.24)
GFR calc Af Amer: 41 mL/min — ABNORMAL LOW (ref >60)
GFR calc non Af Amer: 35 mL/min — ABNORMAL LOW (ref >60)
GLUCOSE: 76 mg/dL (ref 70–99)
Potassium: 4.4 mmol/L (ref 3.5–5.1)
Sodium: 141 mmol/L (ref 135–145)
Total Bilirubin: 0.5 mg/dL (ref 0.3–1.2)
Total Protein: 7.4 g/dL (ref 6.5–8.1)

## 2018-04-15 LAB — APTT: aPTT: 38 seconds — ABNORMAL HIGH (ref 24–36)

## 2018-04-15 LAB — PROTIME-INR
INR: 1.01
Prothrombin Time: 13.2 seconds (ref 11.4–15.2)

## 2018-04-15 SURGERY — BRONCHOSCOPY, WITH EBUS
Anesthesia: General

## 2018-04-15 MED ORDER — DEXAMETHASONE SODIUM PHOSPHATE 10 MG/ML IJ SOLN
INTRAMUSCULAR | Status: DC | PRN
  Administered 2018-04-15: 10 mg via INTRAVENOUS

## 2018-04-15 MED ORDER — PROPOFOL 10 MG/ML IV BOLUS
INTRAVENOUS | Status: AC
  Filled 2018-04-15: qty 20

## 2018-04-15 MED ORDER — ROCURONIUM BROMIDE 50 MG/5ML IV SOSY
PREFILLED_SYRINGE | INTRAVENOUS | Status: AC
  Filled 2018-04-15: qty 10

## 2018-04-15 MED ORDER — FENTANYL CITRATE (PF) 250 MCG/5ML IJ SOLN
INTRAMUSCULAR | Status: DC | PRN
  Administered 2018-04-15: 75 ug via INTRAVENOUS
  Administered 2018-04-15: 25 ug via INTRAVENOUS

## 2018-04-15 MED ORDER — EPINEPHRINE PF 1 MG/ML IJ SOLN
INTRAMUSCULAR | Status: DC | PRN
  Administered 2018-04-15: 1 mg via ENDOTRACHEOPULMONARY

## 2018-04-15 MED ORDER — EPHEDRINE SULFATE-NACL 50-0.9 MG/10ML-% IV SOSY
PREFILLED_SYRINGE | INTRAVENOUS | Status: DC | PRN
  Administered 2018-04-15: 10 mg via INTRAVENOUS

## 2018-04-15 MED ORDER — ROCURONIUM BROMIDE 10 MG/ML (PF) SYRINGE
PREFILLED_SYRINGE | INTRAVENOUS | Status: DC | PRN
  Administered 2018-04-15: 50 mg via INTRAVENOUS

## 2018-04-15 MED ORDER — SODIUM CHLORIDE 0.9 % IV SOLN
INTRAVENOUS | Status: DC
  Administered 2018-04-15 (×2): via INTRAVENOUS

## 2018-04-15 MED ORDER — MIDAZOLAM HCL 2 MG/2ML IJ SOLN
INTRAMUSCULAR | Status: AC
  Filled 2018-04-15: qty 2

## 2018-04-15 MED ORDER — EPHEDRINE 5 MG/ML INJ
INTRAVENOUS | Status: AC
  Filled 2018-04-15: qty 10

## 2018-04-15 MED ORDER — SUCCINYLCHOLINE CHLORIDE 200 MG/10ML IV SOSY
PREFILLED_SYRINGE | INTRAVENOUS | Status: DC | PRN
  Administered 2018-04-15: 120 mg via INTRAVENOUS

## 2018-04-15 MED ORDER — 0.9 % SODIUM CHLORIDE (POUR BTL) OPTIME
TOPICAL | Status: DC | PRN
  Administered 2018-04-15: 1000 mL

## 2018-04-15 MED ORDER — EPINEPHRINE PF 1 MG/ML IJ SOLN
INTRAMUSCULAR | Status: AC
  Filled 2018-04-15: qty 1

## 2018-04-15 MED ORDER — GLYCOPYRROLATE PF 0.2 MG/ML IJ SOSY
PREFILLED_SYRINGE | INTRAMUSCULAR | Status: AC
  Filled 2018-04-15: qty 1

## 2018-04-15 MED ORDER — LIDOCAINE 2% (20 MG/ML) 5 ML SYRINGE
INTRAMUSCULAR | Status: DC | PRN
  Administered 2018-04-15: 40 mg via INTRAVENOUS

## 2018-04-15 MED ORDER — FENTANYL CITRATE (PF) 100 MCG/2ML IJ SOLN: 25.0000 ug | INTRAMUSCULAR | Status: DC | PRN

## 2018-04-15 MED ORDER — ONDANSETRON HCL 4 MG/2ML IJ SOLN
INTRAMUSCULAR | Status: DC | PRN
  Administered 2018-04-15: 4 mg via INTRAVENOUS

## 2018-04-15 MED ORDER — SUGAMMADEX SODIUM 200 MG/2ML IV SOLN
INTRAVENOUS | Status: DC | PRN
  Administered 2018-04-15: 200 mg via INTRAVENOUS

## 2018-04-15 MED ORDER — ONDANSETRON HCL 4 MG/2ML IJ SOLN: 4.0000 mg | Freq: Once | INTRAMUSCULAR | Status: DC | PRN

## 2018-04-15 MED ORDER — FENTANYL CITRATE (PF) 250 MCG/5ML IJ SOLN
INTRAMUSCULAR | Status: AC
  Filled 2018-04-15: qty 5

## 2018-04-15 MED ORDER — PROPOFOL 10 MG/ML IV BOLUS
INTRAVENOUS | Status: DC | PRN
  Administered 2018-04-15: 140 mg via INTRAVENOUS

## 2018-04-15 SURGICAL SUPPLY — 40 items
ADAPTER VALVE BIOPSY EBUS (MISCELLANEOUS) IMPLANT
ADPTR VALVE BIOPSY EBUS (MISCELLANEOUS)
BRUSH CYTOL CELLEBRITY 1.5X140 (MISCELLANEOUS) ×2 IMPLANT
CANISTER SUCT 3000ML PPV (MISCELLANEOUS) ×3 IMPLANT
CONT SPEC 4OZ CLIKSEAL STRL BL (MISCELLANEOUS) ×3 IMPLANT
COVER BACK TABLE 60X90IN (DRAPES) ×3 IMPLANT
FILTER STRAW FLUID ASPIR (MISCELLANEOUS) IMPLANT
FORCEPS BIOP RJ4 1.8 (CUTTING FORCEPS) ×2 IMPLANT
FORCEPS RADIAL JAW LRG 4 PULM (INSTRUMENTS) IMPLANT
GAUZE SPONGE 4X4 12PLY STRL (GAUZE/BANDAGES/DRESSINGS) IMPLANT
GLOVE SURG SIGNA 7.5 PF LTX (GLOVE) ×3 IMPLANT
GOWN STRL REUS W/ TWL XL LVL3 (GOWN DISPOSABLE) ×1 IMPLANT
GOWN STRL REUS W/TWL XL LVL3 (GOWN DISPOSABLE) ×2
KIT CLEAN ENDO COMPLIANCE (KITS) ×6 IMPLANT
KIT TURNOVER KIT B (KITS) ×3 IMPLANT
MARKER SKIN DUAL TIP RULER LAB (MISCELLANEOUS) ×3 IMPLANT
NDL ASPIRATION VIZISHOT 19G (NEEDLE) IMPLANT
NDL ASPIRATION VIZISHOT 21G (NEEDLE) ×1 IMPLANT
NDL BLUNT 18X1 FOR OR ONLY (NEEDLE) IMPLANT
NEEDLE ASPIRATION VIZISHOT 19G (NEEDLE) ×3 IMPLANT
NEEDLE ASPIRATION VIZISHOT 21G (NEEDLE) ×3 IMPLANT
NEEDLE BLUNT 18X1 FOR OR ONLY (NEEDLE) IMPLANT
NS IRRIG 1000ML POUR BTL (IV SOLUTION) ×3 IMPLANT
OIL SILICONE PENTAX (PARTS (SERVICE/REPAIRS)) ×3 IMPLANT
PAD ARMBOARD 7.5X6 YLW CONV (MISCELLANEOUS) ×6 IMPLANT
RADIAL JAW LRG 4 PULMONARY (INSTRUMENTS)
SYR 20CC LL (SYRINGE) ×3 IMPLANT
SYR 20ML ECCENTRIC (SYRINGE) ×3 IMPLANT
SYR 3ML LL SCALE MARK (SYRINGE) IMPLANT
SYR 5ML LL (SYRINGE) ×3 IMPLANT
SYR 5ML LUER SLIP (SYRINGE) ×3 IMPLANT
TOWEL GREEN STERILE (TOWEL DISPOSABLE) ×3 IMPLANT
TOWEL GREEN STERILE FF (TOWEL DISPOSABLE) ×3 IMPLANT
TRAP SPECIMEN MUCOUS 40CC (MISCELLANEOUS) ×3 IMPLANT
TUBE CONNECTING 20'X1/4 (TUBING) ×1
TUBE CONNECTING 20X1/4 (TUBING) ×2 IMPLANT
VALVE BIOPSY  SINGLE USE (MISCELLANEOUS) ×2
VALVE BIOPSY SINGLE USE (MISCELLANEOUS) ×1 IMPLANT
VALVE SUCTION BRONCHIO DISP (MISCELLANEOUS) ×3 IMPLANT
WATER STERILE IRR 1000ML POUR (IV SOLUTION) ×3 IMPLANT

## 2018-04-15 NOTE — Op Note (Signed)
NAME: QUASHAWN, JEWKES MEDICAL RECORD EM:[id number] ACCOUNT [id number] DATE OF BIRTH:June 09, 1950 FACILITY: [facility] LOCATION: [facility] PHYSICIAN:Jenevieve Kirschbaum Chaya Jan, MD  OPERATIVE REPORT  DATE OF PROCEDURE:  04/15/2018  PREOPERATIVE DIAGNOSIS:  Mediastinal and hilar adenopathy.  POSTOPERATIVE DIAGNOSIS:  Metastatic carcinoma.  PROCEDURE:   1.Video bronchoscopy with brushings and endobronchial biopsies and 2. Endobronchial ultrasound with mediastinal lymph node needle aspirations.  SURGEON:  Modesto Charon, MD  ASSISTANT:  None.  ANESTHESIA:  General.  FINDINGS:  Markedly enlarged 4R and 7 lymph nodes, mucosal tumor in left main stem bronchus, likely due to invasion from the subcarinal lymph nodes.  Brushings showed carcinoma.  CLINICAL NOTE:  The patient is a 68 year old gentleman with a history of tobacco abuse and prior history of stage IVA squamous cell carcinoma of the base of the tongue.  He recently had a followup CT which showed multiple lung nodules along with  mediastinal and hilar adenopathy.  PET CT showed the nodules and lymph nodes are hypermetabolic.  He was advised to undergo bronchoscopy and endobronchial ultrasound for diagnostic purposes.  The indications, risks, benefits, and alternatives were  discussed in detail with the patient.  He understood and accepted the risks and agreed to proceed.  DESCRIPTION OF PROCEDURE:  The patient was brought to the operating room on 04/15/2018.  Anesthesia was induced and he was intubated.  A timeout was performed.  Flexible fiberoptic bronchoscopy was performed via the endotracheal tube.  In the left main  stem bronchus along the inferior aspect of that there was a visible tumor.  This was approximately a centimeter in width and 2.5 cm in length and less than a centimeter in depth.  It was suspected this was local invasion from the subcarinal lymph nodes.   There were no other endobronchial lesions seen to the level of the  subsegmental bronchi.  Brushings were obtained from the left main stem prior to removing the bronchoscope.  The endobronchial ultrasound scope was placed.  A large level 7 node was easily identified and needle aspirations were obtained.  Next, the scope was  repositioned to the 4R nodes.  There were enlarged lymph nodes in this area.  Two aspirations were performed, one with and one without suction applied.  Part of each specimen was placed into cytologic preparation fluid for cell block.  The bronchoscope was replaced and multiple biopsies were obtained from the tumor in the proximal left main stem bronchus.  These were sent for permanent pathology only.  The level 7 node and brushings both showed carcinoma, further characterization will  await permanent pathology.  There was some bleeding with biopsies and dilute epinephrine was applied.  The scope was removed.  After 5 minutes, the scope was reinserted.  There was no ongoing bleeding.  The patient then was extubated in the operating  room and taken to the [facility] in good condition.  TN/NUANCE  D:04/15/2018 T:04/15/2018 JOB:[id number]

## 2018-04-15 NOTE — Transfer of Care (Signed)
Immediate Anesthesia Transfer of Care Note  Patient: Alexander [name]  Procedure(s) Performed: VIDEO BRONCHOSCOPY WITH ENDOBRONCHIAL ULTRASOUND (N/A )  Patient Location: PACU  Anesthesia Type:General  Level of Consciousness: awake, alert , oriented and patient cooperative  Airway & Oxygen Therapy: Patient Spontanous Breathing and Patient connected to nasal cannula oxygen  Post-op Assessment: Report given to RN and Post -op Vital signs reviewed and stable  Post vital signs: Reviewed and stable  Last Vitals:  Vitals Value Taken Time  BP 116/99 04/15/2018  2:17 PM  Temp    Pulse 57 04/15/2018  2:25 PM  Resp 14 04/15/2018  2:25 PM  SpO2 97 % 04/15/2018  2:25 PM  Vitals shown include unvalidated device data.  Last Pain:  Vitals:   04/15/18 1035  TempSrc:   PainSc: 0-No pain      Patients Stated Pain Goal: 2 (05/08/15 3567)  Complications: No apparent anesthesia complications

## 2018-04-15 NOTE — Brief Op Note (Signed)
04/15/2018  1:32 PM  PATIENT:  Alexander [name]  68 y.o. male  PRE-OPERATIVE DIAGNOSIS:  MEDIASTINAL AND HILAR ADENOPATHY  POST-OPERATIVE DIAGNOSIS:  MEDIASTINAL AND HILAR ADENOPATHY- metastatic carcinoma  PROCEDURE:  Procedure(s): VIDEO BRONCHOSCOPY WITH ENDOBRONCHIAL ULTRASOUND (N/A) Brushings and endobronchial biopsies  SURGEON:  Surgeon(s) and Role:    * Melrose Nakayama, MD - Primary  PHYSICIAN ASSISTANT:   ASSISTANTS: none   ANESTHESIA:   general  EBL:  10 mL   BLOOD ADMINISTERED:none  DRAINS: none   LOCAL MEDICATIONS USED:  NONE  SPECIMEN:  Source of Specimen:  Left main stem bronchus, mediastinal lymph nodes  DISPOSITION OF SPECIMEN:  PATHOLOGY  COUNTS:  NO endoscopic  TOURNIQUET:  * No tourniquets in log *  DICTATION: .Other Dictation: Dictation Number -  PLAN OF CARE: Discharge to home after PACU  PATIENT DISPOSITION:  PACU - hemodynamically stable.   Delay start of Pharmacological VTE agent (>24hrs) due to surgical blood loss or risk of bleeding: not applicable

## 2018-04-15 NOTE — Anesthesia Procedure Notes (Signed)
Procedure Name: Intubation Date/Time: 04/15/2018 12:29 PM Performed by: Oletta Lamas, CRNA Pre-anesthesia Checklist: Patient identified, Emergency Drugs available, Suction available and Patient being monitored Patient Re-evaluated:Patient Re-evaluated prior to induction Oxygen Delivery Method: Circle System Utilized Preoxygenation: Pre-oxygenation with 100% oxygen Induction Type: IV induction Laryngoscope Size: Glidescope Grade View: Grade I Tube type: Oral Number of attempts: 1 Airway Equipment and Method: Stylet Placement Confirmation: ETT inserted through vocal cords under direct vision,  positive ETCO2 and breath sounds checked- equal and bilateral Secured at: 21 cm Tube secured with: Tape Dental Injury: Teeth and Oropharynx as per pre-operative assessment

## 2018-04-15 NOTE — Anesthesia Postprocedure Evaluation (Signed)
Anesthesia Post Note  Patient: ZEYAD DELAGUILA  Procedure(s) Performed: VIDEO BRONCHOSCOPY WITH ENDOBRONCHIAL ULTRASOUND (N/A )     Patient location during evaluation: PACU Anesthesia Type: General Level of consciousness: awake and alert Pain management: pain level controlled Vital Signs Assessment: post-procedure vital signs reviewed and stable Respiratory status: spontaneous breathing, nonlabored ventilation, respiratory function stable and patient connected to nasal cannula oxygen Cardiovascular status: blood pressure returned to baseline and stable Postop Assessment: no apparent nausea or vomiting Anesthetic complications: no    Last Vitals:  Vitals:   04/15/18 1430 04/15/18 1454  BP: 113/70 113/71  Pulse: (!) 58 65  Resp: 14   Temp:    SpO2: 96% 96%    Last Pain:  Vitals:   04/15/18 1454  TempSrc:   PainSc: 0-No pain                 Jakobie Henslee COKER

## 2018-04-15 NOTE — Discharge Instructions (Addendum)
Do not drive or engage in heavy physical activity for 24 hours  You may resume normal activities tomorrow  You may cough up small amounts of blood over the next few days. Call if you cough up more than 2 tablespoons of blood.  You may use acetaminophen (Tylenol) if needed for discomfort  Call [phone] if you develop chest pain, shortness of breath, fever > 101 F or cough up more than 2 tbsp of blood  Follow up as scheduled with Dr. Walden Field

## 2018-04-15 NOTE — Interval H&P Note (Signed)
History and Physical Interval Note:  04/15/2018 12:06 PM  Alexander [name]  has presented today for surgery, with the diagnosis of MEDIASTINAL AND HILAR ADENOPATHY  The various methods of treatment have been discussed with the patient and family. After consideration of risks, benefits and other options for treatment, the patient has consented to  Procedure(s): [place] (N/A) as a surgical intervention .  The patient's history has been reviewed, patient examined, no change in status, stable for surgery.  I have reviewed the patient's chart and labs.  Questions were answered to the patient's satisfaction.     Melrose Nakayama

## 2018-04-16 ENCOUNTER — Ambulatory Visit (HOSPITAL_COMMUNITY): Payer: Self-pay | Admitting: Internal Medicine

## 2018-04-16 ENCOUNTER — Encounter (HOSPITAL_COMMUNITY): Payer: Self-pay | Admitting: Thoracic Surgery (Cardiothoracic Vascular Surgery)

## 2018-04-22 ENCOUNTER — Encounter (HOSPITAL_COMMUNITY): Payer: Self-pay | Admitting: Internal Medicine

## 2018-04-22 ENCOUNTER — Inpatient Hospital Stay (HOSPITAL_COMMUNITY): Payer: Medicare HMO | Attending: Hematology | Admitting: Internal Medicine

## 2018-04-22 ENCOUNTER — Encounter (HOSPITAL_COMMUNITY): Payer: Self-pay | Admitting: *Deleted

## 2018-04-22 ENCOUNTER — Other Ambulatory Visit: Payer: Self-pay

## 2018-04-22 VITALS — BP 125/97 | HR 64 | Temp 98.7°F | Resp 16 | Wt 150.3 lb

## 2018-04-22 DIAGNOSIS — Z9221 Personal history of antineoplastic chemotherapy: Secondary | ICD-10-CM | POA: Diagnosis not present

## 2018-04-22 DIAGNOSIS — C109 Malignant neoplasm of oropharynx, unspecified: Secondary | ICD-10-CM | POA: Diagnosis present

## 2018-04-22 DIAGNOSIS — R1310 Dysphagia, unspecified: Secondary | ICD-10-CM | POA: Diagnosis not present

## 2018-04-22 DIAGNOSIS — Z87891 Personal history of nicotine dependence: Secondary | ICD-10-CM

## 2018-04-22 DIAGNOSIS — E039 Hypothyroidism, unspecified: Secondary | ICD-10-CM | POA: Diagnosis not present

## 2018-04-22 DIAGNOSIS — R52 Pain, unspecified: Secondary | ICD-10-CM

## 2018-04-22 DIAGNOSIS — Z923 Personal history of irradiation: Secondary | ICD-10-CM | POA: Diagnosis not present

## 2018-04-22 MED ORDER — LEVOTHYROXINE SODIUM 25 MCG PO TABS: 25.0000 ug | ORAL_TABLET | Freq: Every day | ORAL | 1 refills | Status: DC

## 2018-04-22 NOTE — Progress Notes (Signed)
I spoke with Alexander [name] in pathology and ordered foundation one and PD-L1 on accession # [id number]. Stage IV, C34.90.

## 2018-04-22 NOTE — Progress Notes (Signed)
Diagnosis Oropharyngeal carcinoma ([place]) - Plan: MR Brain W Wo Contrast, CBC with Differential/Platelet, Comprehensive metabolic panel, Lactate dehydrogenase  Staging Cancer Staging Oropharyngeal carcinoma (HCC) Staging form: Pharynx - Oropharynx, AJCC 7th Edition - Clinical: Stage IVA (T4a, N2b, M0) - Unsigned   Assessment and Plan:   1.  Stage IVA Oropharyngeal cancer.  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 S/P curative concomitant chemo/XRT with remission on PET imaging in October 2016.  He was previously last seen at [facility] in 05/2016 and has failed to keep follow-up appointments.  He called in requesting pain medications.  Previously, he was instructed he would no longer be prescribed opiods by PA Kefalas.    Pt was recommended for PET scan but did not have imaging done.  He was set up for CT neck CAP that was done on 09/20/2017 and showed   IMPRESSION:  CT neck:  1. Limited assessment of the pharynx due to motion. 2. Decreased size of low-density left level II lymph node. No evidence of new or progressive cervical lymphadenopathy.  1. Interval development of bulky mediastinal adenopathy concerning for metastatic disease. 2. Interval development of pulmonary nodules within the left lower lobe concerning for the possibility of pulmonary metastatic disease. 3. No definite metastatic disease identified within the abdomen or pelvis. Multiple hepatic cysts are demonstrated.  Pt was referred to Dr. Benjamine Mola and was seen by him on 10/07/2017 and recommended PET/CT.  Pt did not have imaging done.  He has smoking history and reports that he quit smoking but started Vaping.    PET scan done 02/18/2018 reviewed and showed  IMPRESSION: 1. Interval development of hypermetabolic mediastinal and left hilar lymphadenopathy associated with small hypermetabolic left lung nodules. Metastatic disease a distinct consideration and left  lung primary with metastatic disease to the left hilum and mediastinum not excluded. 2. Persistent hypermetabolic activity in the right prostate gland. 3. Similar appearance of diffuse FDG uptake in the tongue and muscles of the left neck, likely related to movement.  Pt was referred to CT surgery for evaluation for EBUS/Bronch for tissue diagnosis of lung findings.    Pt underwent Video bronchoscopy with brushings and endobronchial biopsies and endobronchial ultrasound with mediastinal lymph node needle aspirations on 04/15/2018  SURGEON:  Modesto Charon, MD  FINDINGS:  Markedly enlarged 4R and 7 lymph nodes, mucosal tumor and left main stem bronchus, likely due to invasion from the subcarinal lymph nodes.  Brushings showed carcinoma.  Path  Bronchus, biopsy, Left Mainstem done 04/15/2018 - SQUAMOUS CELL CARCINOMA, BASALOID.  Basaloid SCCs are frequently associated with HPV 16 positivity and are most common in the oropharynx, ie, the tonsil or base of tongue which indicates findings likely originated from original head and neck cancer history.   I have discussed with him he will undergo MRI of brain to complete staging evaluation.    I have also discussed with him options of therapy with Taxol 175 mg/m2 with carboplatin AUC of 5 every 3 weeks with Keytruda 200 mg IV every 3 weeks based on results of Keynote -48 trial that established Keytruda for metastatic SCC of head and neck with platinum based therapy . The addition of Keytruda to platinum based therapy improved OS.  In pts with large tumor burden, the regimen also showed more rapid tumor response.    Will also request PDL1, and P16 testing.  Pt is set up for [facility] placement.    He will also be  set up for chemotherapy teaching on RTC to go over MRI brain.  All questions answered and he expressed understanding of the information presented.    2.  Dysphagia.  Pt was seen by GI and underwent EGD on 01/15/2018 and showed esophagitis,  gastritis and hiatal hernia.  Pt was dilated.   Follow-up with GI as directed.    3.  Pain.  Pt was previously referred to pain clinic for evaluation and management.    4.  RI.  Labs done 04/15/2018 reviewed and showed WBC 4.2 HB 14.4 plts 204,000.  Chemistries WNL with K+ 4.4 and normal LFTs.  Cr 1.90.  Pt previously referred to nephrology.    Will discuss with Pharmacy pt's prior treatment.  Also will need to closely follow renal function with Carboplatin.    5.  Hypothyroidism.  TSH noted to be elevated at 8.8 on labs done 08/10/2017.  He has not followed up with PCP or endocrinology.  Pt not on synthroid.  Will prescribe Synthroid 25 mcg po daily initially and follow TFTs due to risk for worsening hypothyroidism with Keytruda.    6.  Noncompliance. Pt has not followed up with Dr. Benjamine Mola or this office and has not had imaging done as previously recommended. Will recommend social work evaluation to assess deterrent to compliance.    7.  Smoking.  Pt reports he is no longer smoking cigarettes but he is vaping.  I have discussed with him potential risks of vaping as well as smoking.    25 minutes spent with more than 50% spent in counseling and coordination of care.    Interval History: Historical data obtained from note dated 08/14/2017.   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 S/P curative concomitant chemo/XRT with remission on PET imaging in October 2016.  Current Status:  Pt is seen today for follow-up.  He is here to go over pathology.      Oropharyngeal carcinoma ([place])   07/27/2014 Imaging    CT neck- Advanced stage oropharyngeal cancer with necrotic adenopathy accounting for the left neck swelling.    07/28/2014 Initial Diagnosis    Oropharyngeal cancer    08/03/2014 Imaging    CT CAP- L supraclavicular lymphadenopathy is not completely visualized. This is better seen on the previous neck CT from 07/27/2014. Otherwise, no evidence for metastatic  disease in the chest, abdomen, or pelvis.    08/03/2014 Imaging    Bone scan- Uptake at adjacent anterior LEFT 6, 7, 8 ribs likely representing trauma/fractures. Questionable nonspecific increased tracer localization at the posterior RIGHT 8th and 9th ribs, the adjacent nature which raises a a question of trauma as well    08/06/2014 Pathology Results    Dr. Benjamine Mola- Oropharynx, biopsy, Left - INVASIVE SQUAMOUS CELL CARCINOMA.    08/12/2014 Procedure    Dr. Enrique Sack- 1. Multiple extraction of tooth numbers 6, 17, 22, 23, 24, 25, 26, and 27. 3 Quadrants of alveoloplasty    08/17/2014 Pathology Results    PORT and G-TUBE placed by Dr. Carlis Stable.    08/26/2014 PET scan    Large hypermetabolic mass in the left base of tongue. Activity extends across midline to the right base tongue. 2. Intensely hypermetabolic left cervical metastatic lymph nodes. Lymph nodes extend from the left level II position to the left supraclavi    09/01/2014 - 09/22/2014 Chemotherapy    Concurrent chemoradiation with Cisplatin 100 mg/m2 x 2 cycles with Neulasta support. Held cycle #3 d/t renal  toxicity.     09/03/2014 - 10/23/2014 Radiation Therapy    Treated in [place], IMRT Isidore Moos).  Base of tongue and bilat neck. Total dose: 70 Gy in 35 fractions. (of note, he did miss several treatments requiring BID dosing towards the end of treatment).     01/25/2015 PET scan    Near complete resolution of metabolic activity at the base of tongue. Minimal residual activity is likely post treatment effect. 2. Complete resolution of metabolic activity above LEFT cervical lymph nodes. No evidence of residual metabolically active     5/36/6440 Procedure    Port-a-cath removed [name])       Problem List Patient Active Problem List   Diagnosis Date Noted  . Gastritis and gastroduodenitis [K29.70, K39.90]   . Dysphagia [R13.10] 11/09/2017  . History of oropharyngeal cancer [Z85.819] 11/09/2017  . Oropharyngeal carcinoma ([place]) [C10.9]  07/28/2014    Past Medical History Past Medical History:  Diagnosis Date  . GERD (gastroesophageal reflux disease)   . 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 Oropharyngeal cancer ([place]) 07/28/2014   dx. 3 weeks ago.- Dr. Oneal Deputy center [place], [place].  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 BIOPSY  01/15/2018   Procedure: BIOPSY;  Surgeon: Danie Binder, MD;  [facility];  Service: Endoscopy;;  gastric  . COLONOSCOPY N/A 03/13/2016   Procedure: COLONOSCOPY;  Surgeon: Danie Binder, MD;  [facility];  Service: Endoscopy;  Laterality: N/A;  2:15 PM  . ESOPHAGOGASTRODUODENOSCOPY (EGD) WITH PROPOFOL N/A 08/17/2014   Procedure: ESOPHAGOGASTRODUODENOSCOPY (EGD) WITH PROPOFOL (procedure #1);  Surgeon: Aviva Signs Md, MD;  [facility];  Service: General;  Laterality: N/A;  . ESOPHAGOGASTRODUODENOSCOPY (EGD) WITH PROPOFOL N/A 01/15/2018   Procedure: ESOPHAGOGASTRODUODENOSCOPY (EGD) WITH PROPOFOL;  Surgeon: Danie Binder, MD;  [facility];  Service: Endoscopy;  Laterality: N/A;  9:30am  . MULTIPLE EXTRACTIONS WITH ALVEOLOPLASTY N/A 08/12/2014   Procedure: Extraction of tooth #'s 6,17,22,23,24,25,26,27 with alveoloplasty;  Surgeon: Lenn Cal, DDS;  [facility];  Service: Oral Surgery;  Laterality: N/A;  . PANENDOSCOPY N/A 08/06/2014   Procedure: PANENDOSCOPY WITH BIOPSY;  Surgeon: Leta Baptist, MD;  Location: [place];  Service: ENT;  Laterality: N/A;  . PEG PLACEMENT Left 08/17/14  . PEG PLACEMENT N/A 08/17/2014   Procedure: PERCUTANEOUS ENDOSCOPIC GASTROSTOMY (PEG) PLACEMENT (procedure #1);  Surgeon: Aviva Signs Md, MD;  [facility]: [facility];  Service: General;  Laterality: N/A;  . PORT-A-CATH REMOVAL Right 07/17/2016   Procedure: MINOR REMOVAL PORT-A-CATH;  Surgeon:  Aviva Signs, MD;  [facility];  Service: General;  Laterality: Right;  . PORTACATH PLACEMENT Right 08/17/14  . PORTACATH PLACEMENT Right 08/17/2014   Procedure: INSERTION PORT-A-CATH (procedure #2);  Surgeon: Aviva Signs Md, MD;  [facility];  Service: General;  Laterality: Right;  . SAVORY DILATION N/A 01/15/2018   Procedure: SAVORY DILATION;  Surgeon: Danie Binder, MD;  [facility];  Service: Endoscopy;  Laterality: N/A;  . VIDEO BRONCHOSCOPY WITH ENDOBRONCHIAL ULTRASOUND N/A 04/15/2018   Procedure: VIDEO BRONCHOSCOPY WITH ENDOBRONCHIAL ULTRASOUND;  Surgeon: Melrose Nakayama, MD;  Location: [facility] OR;  Service: Thoracic;  Laterality: N/A;    Family History Family History  Problem Relation Age of Onset  . Colon cancer Neg Hx   .  Gastric cancer Neg Hx   . Esophageal cancer Neg Hx      Social History  reports that he quit smoking about 3 years ago. He has a 15.00 pack-year smoking history. He has never used smokeless tobacco. He reports previous alcohol use. He reports that he does not use drugs.  Medications  Current Outpatient Medications:  .  feeding supplement, ENSURE ENLIVE, (ENSURE ENLIVE) LIQD, Take 237 mLs by mouth 2 (two) times daily., Disp: , Rfl:  .  omeprazole (PRILOSEC) 20 MG capsule, 1 PO 30 MINS PRIOR TO BREAKFAST. (Patient taking differently: Take 20 mg by mouth daily before breakfast. 1 PO 30 MINS PRIOR TO BREAKFAST.), Disp: 90 capsule, Rfl: 3  Allergies Patient has no known allergies.  Review of Systems Review of Systems - Oncology ROS negative   Physical Exam  Vitals Wt Readings from Last 3 Encounters:  04/22/18 150 lb 5 oz (68.2 kg)  04/15/18 158 lb (71.7 kg)  04/09/18 148 lb (67.1 kg)   Temp Readings from Last 3 Encounters:  04/22/18 98.7 F (37.1 C) (Oral)  04/15/18 (!) 97.3 F (36.3 C)  02/25/18 97.6 F (36.4 C) (Oral)   BP Readings from Last 3 Encounters:  04/22/18 (!) 125/97  04/15/18 113/71  04/09/18 121/68    Pulse Readings from Last 3 Encounters:  04/22/18 64  04/15/18 65  04/09/18 60   Constitutional: Well-developed, well-nourished, and in no distress.   HENT: Head: Normocephalic and atraumatic.  Mouth/Throat: No oropharyngeal exudate. Mucosa moist. Eyes: Pupils are equal, round, and reactive to light. Conjunctivae are normal. No scleral icterus.  Neck: Normal range of motion. Neck supple. No JVD present.  Cardiovascular: Normal rate, regular rhythm and normal heart sounds.  Exam reveals no gallop and no friction rub.   No murmur heard. Pulmonary/Chest: Effort normal and breath sounds normal. No respiratory distress. No wheezes.No rales.  Abdominal: Soft. Bowel sounds are normal. No distension. There is no tenderness. There is no guarding.  Musculoskeletal: No edema or tenderness.  Lymphadenopathy: No cervical, axillary or supraclavicular adenopathy.  Neurological: Alert and oriented to person, place, and time. No cranial nerve deficit.  Skin: Skin is warm and dry. No rash noted. No erythema. No pallor.  Psychiatric: Affect and judgment normal.   Labs No visits with results within 3 Day(s) from this visit.  Latest known visit with results is:  Admission on 04/15/2018, Discharged on 04/15/2018  Component Date Value Ref Range Status  . aPTT 04/15/2018 38* 24 - 36 seconds Final   Comment:        IF BASELINE aPTT IS ELEVATED, SUGGEST PATIENT RISK ASSESSMENT BE USED TO DETERMINE APPROPRIATE ANTICOAGULANT THERAPY. Performed at [facility], [place] [address] [place], [place] [address]   . WBC 04/15/2018 4.2  4.0 - 10.5 K/uL Final  . RBC 04/15/2018 4.87  4.22 - 5.81 MIL/uL Final  . Hemoglobin 04/15/2018 14.4  13.0 - 17.0 g/dL Final  . HCT 04/15/2018 46.0  39.0 - 52.0 % Final  . MCV 04/15/2018 94.5  80.0 - 100.0 fL Final  . MCH 04/15/2018 29.6  26.0 - 34.0 pg Final  . MCHC 04/15/2018 31.3  30.0 - 36.0 g/dL Final  . RDW 04/15/2018 14.2  11.5 - 15.5 % Final  . Platelets 04/15/2018  204  150 - 400 K/uL Final  . nRBC 04/15/2018 0.0  0.0 - 0.2 % Final   Performed at [facility], [place] [address], [place], [place] [address]  . Sodium 04/15/2018  141  135 - 145 mmol/L Final  . Potassium 04/15/2018 4.4  3.5 - 5.1 mmol/L Final  . Chloride 04/15/2018 105  98 - 111 mmol/L Final  . CO2 04/15/2018 22  22 - 32 mmol/L Final  . Glucose, Bld 04/15/2018 76  70 - 99 mg/dL Final  . BUN 04/15/2018 21  8 - 23 mg/dL Final  . Creatinine, Ser 04/15/2018 1.90* 0.61 - 1.24 mg/dL Final  . Calcium 04/15/2018 9.4  8.9 - 10.3 mg/dL Final  . Total Protein 04/15/2018 7.4  6.5 - 8.1 g/dL Final  . Albumin 04/15/2018 4.1  3.5 - 5.0 g/dL Final  . AST 04/15/2018 17  15 - 41 U/L Final  . ALT 04/15/2018 13  0 - 44 U/L Final  . Alkaline Phosphatase 04/15/2018 51  38 - 126 U/L Final  . Total Bilirubin 04/15/2018 0.5  0.3 - 1.2 mg/dL Final  . GFR calc non Af Amer 04/15/2018 35* >60 mL/min Final  . GFR calc Af Amer 04/15/2018 41* >60 mL/min Final  . Anion gap 04/15/2018 14  5 - 15 Final   Performed at [facility], [place] [address] [place], [place] [address]  . Prothrombin Time 04/15/2018 13.2  11.4 - 15.2 seconds Final  . INR 04/15/2018 1.01   Final   Performed at [facility], [place] [address] [place], [place] [address]     Pathology Orders Placed This Encounter  Procedures  . MR Brain W Wo Contrast    Standing Status:   Future    Standing Expiration Date:   04/22/2019    Order Specific Question:   If indicated for the ordered procedure, I authorize the administration of contrast media per Radiology protocol    Answer:   Yes    Order Specific Question:   What is the patient's sedation requirement?    Answer:   No Sedation    Order Specific Question:   Does the patient have a pacemaker or implanted devices?    Answer:   No    Order Specific Question:   Use SRS Protocol?    Answer:   No    Order Specific Question:   Radiology Contrast Protocol - do NOT remove file path    Answer:    [email or web address]    Order Specific Question:   Preferred imaging location?    Answer:   [facility] (table limit-350lbs)  . CBC with Differential/Platelet    Standing Status:   Future    Standing Expiration Date:   04/23/2019  . Comprehensive metabolic panel    Standing Status:   Future    Standing Expiration Date:   04/23/2019  . Lactate dehydrogenase    Standing Status:   Future    Standing Expiration Date:   04/23/2019       Zoila Shutter MD

## 2018-04-23 ENCOUNTER — Ambulatory Visit (INDEPENDENT_AMBULATORY_CARE_PROVIDER_SITE_OTHER): Payer: Medicare HMO | Admitting: General Surgery

## 2018-04-23 ENCOUNTER — Encounter (HOSPITAL_COMMUNITY): Payer: Self-pay | Admitting: *Deleted

## 2018-04-23 ENCOUNTER — Encounter: Payer: Self-pay | Admitting: General Surgery

## 2018-04-23 VITALS — BP 110/92 | HR 55 | Temp 98.4°F | Resp 18 | Wt 150.8 lb

## 2018-04-23 DIAGNOSIS — C109 Malignant neoplasm of oropharynx, unspecified: Secondary | ICD-10-CM

## 2018-04-23 NOTE — H&P (Signed)
Alexander [name]; [id number]; [birth year]   HPI Patient is a 68 year old black male who was referred to my care by Dr. Delton Coombes for Port-A-Cath placement.  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  He is about to undergo chemotherapy.  He has had a Port-A-Cath in the past on the right side.  He currently has 0 out of 10 pain.  He denies any fever or chills. Past Medical History:  Diagnosis Date  . GERD (gastroesophageal reflux disease)   . 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 Oropharyngeal cancer ([place]) 07/28/2014   dx. 3 weeks ago.- Dr. Oneal Deputy center [place], [place].  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 BIOPSY  01/15/2018   Procedure: BIOPSY;  Surgeon: Danie Binder, MD;  [facility];  Service: Endoscopy;;  gastric  . COLONOSCOPY N/A 03/13/2016   Procedure: COLONOSCOPY;  Surgeon: Danie Binder, MD;  [facility];  Service: Endoscopy;  Laterality: N/A;  2:15 PM  . ESOPHAGOGASTRODUODENOSCOPY (EGD) WITH PROPOFOL N/A 08/17/2014   Procedure: ESOPHAGOGASTRODUODENOSCOPY (EGD) WITH PROPOFOL (procedure #1);  Surgeon: Aviva Signs Md, MD;  [facility];  Service: General;  Laterality: N/A;  . ESOPHAGOGASTRODUODENOSCOPY (EGD) WITH PROPOFOL N/A 01/15/2018   Procedure: ESOPHAGOGASTRODUODENOSCOPY (EGD) WITH PROPOFOL;  Surgeon: Danie Binder, MD;  [facility];  Service: Endoscopy;  Laterality: N/A;  9:30am  . MULTIPLE EXTRACTIONS WITH ALVEOLOPLASTY N/A 08/12/2014   Procedure: Extraction of tooth #'s 6,17,22,23,24,25,26,27 with alveoloplasty;  Surgeon: Lenn Cal, DDS;  [facility];  Service: Oral Surgery;  Laterality: N/A;  . PANENDOSCOPY N/A 08/06/2014   Procedure: PANENDOSCOPY WITH BIOPSY;  Surgeon: Leta Baptist, MD;  Location: [place];  Service: ENT;  Laterality:  N/A;  . PEG PLACEMENT Left 08/17/14  . PEG PLACEMENT N/A 08/17/2014   Procedure: PERCUTANEOUS ENDOSCOPIC GASTROSTOMY (PEG) PLACEMENT (procedure #1);  Surgeon: Aviva Signs Md, MD;  [facility];  Service: General;  Laterality: N/A;  . PORT-A-CATH REMOVAL Right 07/17/2016   Procedure: MINOR REMOVAL PORT-A-CATH;  Surgeon: Aviva Signs, MD;  [facility];  Service: General;  Laterality: Right;  . PORTACATH PLACEMENT Right 08/17/14  . PORTACATH PLACEMENT Right 08/17/2014   Procedure: INSERTION PORT-A-CATH (procedure #2);  Surgeon: Aviva Signs Md, MD;  [facility];  Service: General;  Laterality: Right;  . SAVORY DILATION N/A 01/15/2018   Procedure: SAVORY DILATION;  Surgeon: Danie Binder, MD;  [facility];  Service: Endoscopy;  Laterality: N/A;  . VIDEO BRONCHOSCOPY WITH ENDOBRONCHIAL ULTRASOUND N/A 04/15/2018   Procedure: VIDEO BRONCHOSCOPY WITH ENDOBRONCHIAL ULTRASOUND;  Surgeon: Melrose Nakayama, MD;  Location: [facility] OR;  Service: Thoracic;  Laterality: N/A;    Family History  Problem Relation Age of Onset  . Colon cancer Neg Hx   . Gastric cancer Neg Hx   . Esophageal cancer Neg Hx     Current Outpatient Medications on File Prior to Visit  Medication Sig Dispense Refill  . feeding supplement, ENSURE ENLIVE, (ENSURE ENLIVE) LIQD Take 237 mLs by mouth 2 (two) times daily.    Marland Kitchen levothyroxine (SYNTHROID) 25 MCG tablet Take 1 tablet (25 mcg total) by mouth daily before breakfast. 30 tablet 1  . omeprazole (PRILOSEC) 20 MG capsule 1 PO 30 MINS PRIOR TO BREAKFAST. (Patient taking differently: Take  20 mg by mouth daily before breakfast. 1 PO 30 MINS PRIOR TO BREAKFAST.) 90 capsule 3   No current facility-administered medications on file prior to visit.     No Known Allergies  Social History   Substance and Sexual Activity  Alcohol Use Not Currently  . Alcohol/week: 0.0 standard drinks   Comment: None currently (11/09/17); previously 1-2 beers on the weekend     Social History   Tobacco Use  Smoking Status Former Smoker  . Packs/day: 0.50  . Years: 30.00  . Pack years: 15.00  . Last attempt to quit: 07/22/2014  . Years since quitting: 3.7  Smokeless Tobacco Never Used    Review of Systems  Constitutional: Negative.   HENT: Negative.   Eyes: Negative.   Respiratory: Negative.   Cardiovascular: Negative.   Gastrointestinal: Negative.   Genitourinary: Negative.   Musculoskeletal: Negative.   Skin: Negative.   Neurological: Negative.   Endo/Heme/Allergies: Negative.   Psychiatric/Behavioral: Negative.     Objective   Vitals:   04/23/18 1313  BP: (!) 110/92  Pulse: (!) 55  Resp: 18  Temp: 98.4 F (36.9 C)    Physical Exam Vitals signs reviewed.  Constitutional:      Appearance: Normal appearance. He is normal weight. He is not ill-appearing.  HENT:     Head: Normocephalic and atraumatic.  Cardiovascular:     Rate and Rhythm: Normal rate and regular rhythm.     Heart sounds: Normal heart sounds. No murmur. No friction rub. No gallop.   Pulmonary:     Effort: Pulmonary effort is normal. No respiratory distress.     Breath sounds: Normal breath sounds. No stridor. No wheezing, rhonchi or rales.  Skin:    General: Skin is warm and dry.  Neurological:     Mental Status: He is alert and oriented to person, place, and time.   Oncology notes reviewed  Assessment  Recurrence of oropharyngeal carcinoma, need for central venous access Plan   Patient is scheduled for Port-A-Cath insertion on 04/26/2018.  The risks and benefits of the procedure including bleeding, infection, and pneumothorax were fully explained to the patient, who gave informed consent.

## 2018-04-23 NOTE — Progress Notes (Signed)
p16 testing was requested on Accession # [id number].

## 2018-04-23 NOTE — Patient Instructions (Signed)

## 2018-04-23 NOTE — Progress Notes (Signed)
Alexander [name]; [id number]; 15-May-1950   HPI Patient is a 68 year old black male who was referred to my care by Dr. Delton Coombes for Port-A-Cath placement.  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  He is about to undergo chemotherapy.  He has had a Port-A-Cath in the past on the right side.  He currently has 0 out of 10 pain.  He denies any fever or chills. Past Medical History:  Diagnosis Date  . GERD (gastroesophageal reflux disease)   . 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 Oropharyngeal cancer ([place]) 07/28/2014   dx. 3 weeks ago.- Dr. [facility] [place], [place].  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 BIOPSY  01/15/2018   Procedure: BIOPSY;  Surgeon: Danie Binder, MD;  [facility];  Service: Endoscopy;;  gastric  . COLONOSCOPY N/A 03/13/2016   Procedure: COLONOSCOPY;  Surgeon: Danie Binder, MD;  [facility];  Service: Endoscopy;  Laterality: N/A;  2:15 PM  . ESOPHAGOGASTRODUODENOSCOPY (EGD) WITH PROPOFOL N/A 08/17/2014   Procedure: ESOPHAGOGASTRODUODENOSCOPY (EGD) WITH PROPOFOL (procedure #1);  Surgeon: Aviva Signs Md, MD;  Location: [facility];  Service: General;  Laterality: N/A;  . ESOPHAGOGASTRODUODENOSCOPY (EGD) WITH PROPOFOL N/A 01/15/2018   Procedure: ESOPHAGOGASTRODUODENOSCOPY (EGD) WITH PROPOFOL;  Surgeon: Danie Binder, MD;  [facility];  Service: Endoscopy;  Laterality: N/A;  9:30am  . MULTIPLE EXTRACTIONS WITH ALVEOLOPLASTY N/A 08/12/2014   Procedure: Extraction of tooth #'s 6,17,22,23,24,25,26,27 with alveoloplasty;  Surgeon: Lenn Cal, DDS;  Location: [facility];  Service: Oral Surgery;  Laterality: N/A;  . PANENDOSCOPY N/A 08/06/2014   Procedure: PANENDOSCOPY WITH BIOPSY;  Surgeon: Leta Baptist, MD;  Location: [facility];  Service: ENT;  Laterality:  N/A;  . PEG PLACEMENT Left 08/17/14  . PEG PLACEMENT N/A 08/17/2014   Procedure: PERCUTANEOUS ENDOSCOPIC GASTROSTOMY (PEG) PLACEMENT (procedure #1);  Surgeon: Aviva Signs Md, MD;  Location: [facility];  Service: General;  Laterality: N/A;  . PORT-A-CATH REMOVAL Right 07/17/2016   Procedure: MINOR REMOVAL PORT-A-CATH;  Surgeon: Aviva Signs, MD;  Location: [facility];  Service: General;  Laterality: Right;  . PORTACATH PLACEMENT Right 08/17/14  . PORTACATH PLACEMENT Right 08/17/2014   Procedure: INSERTION PORT-A-CATH (procedure #2);  Surgeon: Aviva Signs Md, MD;  Location: [facility];  Service: General;  Laterality: Right;  . SAVORY DILATION N/A 01/15/2018   Procedure: SAVORY DILATION;  Surgeon: Danie Binder, MD;  [facility];  Service: Endoscopy;  Laterality: N/A;  . VIDEO BRONCHOSCOPY WITH ENDOBRONCHIAL ULTRASOUND N/A 04/15/2018   Procedure: VIDEO BRONCHOSCOPY WITH ENDOBRONCHIAL ULTRASOUND;  Surgeon: Melrose Nakayama, MD;  Location: [facility];  Service: Thoracic;  Laterality: N/A;    Family History  Problem Relation Age of Onset  . Colon cancer Neg Hx   . Gastric cancer Neg Hx   . Esophageal cancer Neg Hx     Current Outpatient Medications on File Prior to Visit  Medication Sig Dispense Refill  . feeding supplement, ENSURE ENLIVE, (ENSURE ENLIVE) LIQD Take 237 mLs by mouth 2 (two) times daily.    Marland Kitchen levothyroxine (SYNTHROID) 25 MCG tablet Take 1 tablet (25 mcg total) by mouth daily before breakfast. 30 tablet 1  . omeprazole (PRILOSEC) 20 MG capsule 1 PO 30 MINS PRIOR TO BREAKFAST. (Patient taking differently: Take  20 mg by mouth daily before breakfast. 1 PO 30 MINS PRIOR TO BREAKFAST.) 90 capsule 3   No current facility-administered medications on file prior to visit.     No Known Allergies  Social History   Substance and Sexual Activity  Alcohol Use Not Currently  . Alcohol/week: 0.0 standard drinks   Comment: None currently (11/09/17); previously 1-2 beers on the weekend     Social History   Tobacco Use  Smoking Status Former Smoker  . Packs/day: 0.50  . Years: 30.00  . Pack years: 15.00  . Last attempt to quit: 07/22/2014  . Years since quitting: 3.7  Smokeless Tobacco Never Used    Review of Systems  Constitutional: Negative.   HENT: Negative.   Eyes: Negative.   Respiratory: Negative.   Cardiovascular: Negative.   Gastrointestinal: Negative.   Genitourinary: Negative.   Musculoskeletal: Negative.   Skin: Negative.   Neurological: Negative.   Endo/Heme/Allergies: Negative.   Psychiatric/Behavioral: Negative.     Objective   Vitals:   04/23/18 1313  BP: (!) 110/92  Pulse: (!) 55  Resp: 18  Temp: 98.4 F (36.9 C)    Physical Exam Vitals signs reviewed.  Constitutional:      Appearance: Normal appearance. He is normal weight. He is not ill-appearing.  HENT:     Head: Normocephalic and atraumatic.  Cardiovascular:     Rate and Rhythm: Normal rate and regular rhythm.     Heart sounds: Normal heart sounds. No murmur. No friction rub. No gallop.   Pulmonary:     Effort: Pulmonary effort is normal. No respiratory distress.     Breath sounds: Normal breath sounds. No stridor. No wheezing, rhonchi or rales.  Skin:    General: Skin is warm and dry.  Neurological:     Mental Status: He is alert and oriented to person, place, and time.   Oncology notes reviewed  Assessment  Recurrence of oropharyngeal carcinoma, need for central venous access Plan   Patient is scheduled for Port-A-Cath insertion on 04/26/2018.  The risks and benefits of the procedure including bleeding, infection, and pneumothorax were fully explained to the patient, who gave informed consent.

## 2018-04-25 ENCOUNTER — Encounter (HOSPITAL_COMMUNITY)
Admission: RE | Admit: 2018-04-25 | Discharge: 2018-04-25 | Disposition: A | Discharge status: Home / Self Care | Payer: Medicare HMO | Source: Ambulatory Visit | Attending: General Surgery | Admitting: General Surgery

## 2018-04-25 ENCOUNTER — Encounter (HOSPITAL_COMMUNITY): Payer: Self-pay

## 2018-04-25 ENCOUNTER — Ambulatory Visit (HOSPITAL_COMMUNITY)
Admission: RE | Admit: 2018-04-25 | Discharge: 2018-04-25 | Disposition: A | Discharge status: Home / Self Care | Payer: Medicare HMO | Source: Ambulatory Visit | Attending: Internal Medicine | Admitting: Internal Medicine

## 2018-04-25 DIAGNOSIS — C109 Malignant neoplasm of oropharynx, unspecified: Secondary | ICD-10-CM | POA: Insufficient documentation

## 2018-04-25 MED ORDER — GADOBUTROL 1 MMOL/ML IV SOLN
7.0000 mL | Freq: Once | INTRAVENOUS | Status: AC | PRN
  Administered 2018-04-25: 7 mL via INTRAVENOUS

## 2018-04-25 NOTE — Patient Instructions (Signed)
Your procedure is scheduled on: 04/26/2018  Report to [facility] at  9:30   AM.  Call this number if you have problems the morning of surgery: [phone]   Remember:   Do not drink or eat food:After Midnight.  :  Take these medicines the morning of surgery with A SIP OF WATER: Synthroid and prilosec   Do not wear jewelry, make-up or nail polish.  Do not wear lotions, powders, or perfumes. You may wear deodorant.  Do not shave 48 hours prior to surgery. Men may shave face and neck.  Do not bring valuables to the hospital.  Contacts, dentures or bridgework may not be worn into surgery.  Leave suitcase in the car. After surgery it may be brought to your room.  For patients admitted to the hospital, checkout time is 11:00 AM the day of discharge.   Patients discharged the day of surgery will not be allowed to drive home.    Special Instructions: Shower using CHG night before surgery and shower the day of surgery use CHG.  Use special wash - you have one bottle of CHG for all showers.  You should use approximately 1/2 of the bottle for each shower.  Implanted Port Insertion, Care After This sheet gives you information about how to care for yourself after your procedure. Your health care provider may also give you more specific instructions. If you have problems or questions, contact your health care provider. What can I expect after the procedure? After the procedure, it is common to have:  Discomfort at the port insertion site.  Bruising on the skin over the port. This should improve over 3-4 days. Follow these instructions at home: [facility] care  After your port is placed, you will get a manufacturer's information card. The card has information about your port. Keep this card with you at all times.  Take care of the port as told by your health care provider. Ask your health care provider if you or a family member can get training for taking care of the port at home. A home health care  nurse may also take care of the port.  Make sure to remember what type of port you have. Incision care      Follow instructions from your health care provider about how to take care of your port insertion site. Make sure you: ? Wash your hands with soap and water before and after you change your bandage (dressing). If soap and water are not available, use hand sanitizer. ? Change your dressing as told by your health care provider. ? Leave stitches (sutures), skin glue, or adhesive strips in place. These skin closures may need to stay in place for 2 weeks or longer. If adhesive strip edges start to loosen and curl up, you may trim the loose edges. Do not remove adhesive strips completely unless your health care provider tells you to do that.  Check your port insertion site every day for signs of infection. Check for: ? Redness, swelling, or pain. ? Fluid or blood. ? Warmth. ? Pus or a bad smell. Activity  Return to your normal activities as told by your health care provider. Ask your health care provider what activities are safe for you.  Do not lift anything that is heavier than 10 lb (4.5 kg), or the limit that you are told, until your health care provider says that it is safe. General instructions  Take over-the-counter and prescription medicines only as told by your  health care provider.  Do not take baths, swim, or use a hot tub until your health care provider approves. Ask your health care provider if you may take showers. You may only be allowed to take sponge baths.  Do not drive for 24 hours if you were given a sedative during your procedure.  Wear a medical alert bracelet in case of an emergency. This will tell any health care providers that you have a port.  Keep all follow-up visits as told by your health care provider. This is important. Contact a health care provider if:  You cannot flush your port with saline as directed, or you cannot draw blood from the  port.  You have a fever or chills.  You have redness, swelling, or pain around your port insertion site.  You have fluid or blood coming from your port insertion site.  Your port insertion site feels warm to the touch.  You have pus or a bad smell coming from the port insertion site. Get help right away if:  You have chest pain or shortness of breath.  You have bleeding from your port that you cannot control. Summary  Take care of the port as told by your health care provider. Keep the manufacturer's information card with you at all times.  Change your dressing as told by your health care provider.  Contact a health care provider if you have a fever or chills or if you have redness, swelling, or pain around your port insertion site.  Keep all follow-up visits as told by your health care provider. This information is not intended to replace advice given to you by your health care provider. Make sure you discuss any questions you have with your health care provider. Document Released: 01/01/2013 Document Revised: 10/09/2017 Document Reviewed: 10/09/2017 Elsevier Interactive Patient Education  2019 [place], Care After These instructions provide you with information about caring for yourself after your procedure. Your health care provider may also give you more specific instructions. Your treatment has been planned according to current medical practices, but problems sometimes occur. Call your health care provider if you have any problems or questions after your procedure. What can I expect after the procedure? After your procedure, you may:  Feel sleepy for several hours.  Feel clumsy and have poor balance for several hours.  Feel forgetful about what happened after the procedure.  Have poor judgment for several hours.  Feel nauseous or vomit.  Have a sore throat if you had a breathing tube during the procedure. Follow these instructions at  home: For at least 24 hours after the procedure:      Have a responsible adult stay with you. It is important to have someone help care for you until you are awake and alert.  Rest as needed.  Do not: ? Participate in activities in which you could fall or become injured. ? Drive. ? Use heavy machinery. ? Drink alcohol. ? Take sleeping pills or medicines that cause drowsiness. ? Make important decisions or sign legal documents. ? Take care of children on your own. Eating and drinking  Follow the diet that is recommended by your health care provider.  If you vomit, drink water, juice, or soup when you can drink without vomiting.  Make sure you have little or no nausea before eating solid foods. General instructions  Take over-the-counter and prescription medicines only as told by your health care provider.  If you have sleep apnea, surgery  and certain medicines can increase your risk for breathing problems. Follow instructions from your health care provider about wearing your sleep device: ? Anytime you are sleeping, including during daytime naps. ? While taking prescription pain medicines, sleeping medicines, or medicines that make you drowsy.  If you smoke, do not smoke without supervision.  Keep all follow-up visits as told by your health care provider. This is important. Contact a health care provider if:  You keep feeling nauseous or you keep vomiting.  You feel light-headed.  You develop a rash.  You have a fever. Get help right away if:  You have trouble breathing. Summary  For several hours after your procedure, you may feel sleepy and have poor judgment.  Have a responsible adult stay with you for at least 24 hours or until you are awake and alert. This information is not intended to replace advice given to you by your health care provider. Make sure you discuss any questions you have with your health care provider. Document Released: 07/04/2015 Document  Revised: 10/27/2016 Document Reviewed: 07/04/2015 Elsevier Interactive Patient Education  2019 [employer].

## 2018-04-26 ENCOUNTER — Ambulatory Visit (HOSPITAL_COMMUNITY): Payer: Medicare HMO

## 2018-04-26 ENCOUNTER — Ambulatory Visit (HOSPITAL_COMMUNITY): Payer: Medicare HMO | Admitting: Anesthesiology

## 2018-04-26 ENCOUNTER — Ambulatory Visit (HOSPITAL_COMMUNITY)
Admission: RE | Admit: 2018-04-26 | Discharge: 2018-04-26 | Disposition: A | Discharge status: Home / Self Care | Payer: Medicare HMO | Source: Ambulatory Visit | Attending: General Surgery | Admitting: General Surgery

## 2018-04-26 ENCOUNTER — Encounter (HOSPITAL_COMMUNITY): Payer: Self-pay | Admitting: Anesthesiology

## 2018-04-26 ENCOUNTER — Encounter (HOSPITAL_COMMUNITY)
Admission: RE | Disposition: A | Discharge status: Home / Self Care | Payer: Self-pay | Source: Ambulatory Visit | Attending: General Surgery

## 2018-04-26 DIAGNOSIS — Z85818 Personal history of malignant neoplasm of other sites of lip, oral cavity, and pharynx: Secondary | ICD-10-CM | POA: Insufficient documentation

## 2018-04-26 DIAGNOSIS — K2970 Gastritis, unspecified, without bleeding: Secondary | ICD-10-CM | POA: Diagnosis not present

## 2018-04-26 DIAGNOSIS — Z87891 Personal history of nicotine dependence: Secondary | ICD-10-CM | POA: Diagnosis not present

## 2018-04-26 DIAGNOSIS — Z79899 Other long term (current) drug therapy: Secondary | ICD-10-CM | POA: Insufficient documentation

## 2018-04-26 DIAGNOSIS — K219 Gastro-esophageal reflux disease without esophagitis: Secondary | ICD-10-CM | POA: Insufficient documentation

## 2018-04-26 DIAGNOSIS — C109 Malignant neoplasm of oropharynx, unspecified: Secondary | ICD-10-CM | POA: Diagnosis present

## 2018-04-26 DIAGNOSIS — R1310 Dysphagia, unspecified: Secondary | ICD-10-CM | POA: Insufficient documentation

## 2018-04-26 DIAGNOSIS — Z95828 Presence of other vascular implants and grafts: Secondary | ICD-10-CM

## 2018-04-26 HISTORY — PX: PORTACATH PLACEMENT: SHX2246

## 2018-04-26 SURGERY — INSERTION, TUNNELED CENTRAL VENOUS DEVICE, WITH PORT
Anesthesia: Monitor Anesthesia Care | Site: Chest | Laterality: Left

## 2018-04-26 MED ORDER — HEPARIN SOD (PORK) LOCK FLUSH 100 UNIT/ML IV SOLN
INTRAVENOUS | Status: DC | PRN
  Administered 2018-04-26: 500 [IU] via INTRAVENOUS

## 2018-04-26 MED ORDER — LACTATED RINGERS IV SOLN: INTRAVENOUS | Status: DC

## 2018-04-26 MED ORDER — LIDOCAINE HCL (PF) 1 % IJ SOLN
INTRAMUSCULAR | Status: DC | PRN
  Administered 2018-04-26: 7 mL

## 2018-04-26 MED ORDER — PROMETHAZINE HCL 25 MG/ML IJ SOLN: 6.2500 mg | INTRAMUSCULAR | Status: DC | PRN

## 2018-04-26 MED ORDER — KETOROLAC TROMETHAMINE 30 MG/ML IJ SOLN
30.0000 mg | Freq: Once | INTRAMUSCULAR | Status: AC
  Administered 2018-04-26: 30 mg via INTRAVENOUS
  Filled 2018-04-26: qty 1

## 2018-04-26 MED ORDER — LACTATED RINGERS IV SOLN
INTRAVENOUS | Status: DC | PRN
  Administered 2018-04-26: 12:00:00 via INTRAVENOUS

## 2018-04-26 MED ORDER — SODIUM CHLORIDE (PF) 0.9 % IJ SOLN
INTRAMUSCULAR | Status: DC | PRN
  Administered 2018-04-26: 500 mL

## 2018-04-26 MED ORDER — LIDOCAINE 2% (20 MG/ML) 5 ML SYRINGE
INTRAMUSCULAR | Status: AC
  Filled 2018-04-26: qty 15

## 2018-04-26 MED ORDER — GLYCOPYRROLATE PF 0.2 MG/ML IJ SOSY
PREFILLED_SYRINGE | INTRAMUSCULAR | Status: AC
  Filled 2018-04-26: qty 3

## 2018-04-26 MED ORDER — PROPOFOL 10 MG/ML IV BOLUS
INTRAVENOUS | Status: AC
  Filled 2018-04-26: qty 40

## 2018-04-26 MED ORDER — CHLORHEXIDINE GLUCONATE CLOTH 2 % EX PADS: 6.0000 | MEDICATED_PAD | Freq: Once | CUTANEOUS | Status: DC

## 2018-04-26 MED ORDER — FENTANYL CITRATE (PF) 100 MCG/2ML IJ SOLN
INTRAMUSCULAR | Status: DC | PRN
  Administered 2018-04-26: 25 ug via INTRAVENOUS

## 2018-04-26 MED ORDER — MEPERIDINE HCL 50 MG/ML IJ SOLN: 6.2500 mg | INTRAMUSCULAR | Status: DC | PRN

## 2018-04-26 MED ORDER — HYDROCODONE-ACETAMINOPHEN 5-325 MG PO TABS: 1.0000 | ORAL_TABLET | ORAL | 0 refills | Status: DC | PRN

## 2018-04-26 MED ORDER — HYDROMORPHONE HCL 1 MG/ML IJ SOLN: 0.2500 mg | INTRAMUSCULAR | Status: DC | PRN

## 2018-04-26 MED ORDER — CEFAZOLIN SODIUM-DEXTROSE 2-4 GM/100ML-% IV SOLN
2.0000 g | INTRAVENOUS | Status: AC
  Administered 2018-04-26: 2 g via INTRAVENOUS
  Filled 2018-04-26: qty 100

## 2018-04-26 MED ORDER — LIDOCAINE HCL (CARDIAC) PF 100 MG/5ML IV SOSY
PREFILLED_SYRINGE | INTRAVENOUS | Status: DC | PRN
  Administered 2018-04-26: 40 mg via INTRAVENOUS

## 2018-04-26 MED ORDER — PROPOFOL 500 MG/50ML IV EMUL
INTRAVENOUS | Status: DC | PRN
  Administered 2018-04-26: 75 ug/kg/min via INTRAVENOUS

## 2018-04-26 MED ORDER — GLYCOPYRROLATE 0.2 MG/ML IJ SOLN
INTRAMUSCULAR | Status: DC | PRN
  Administered 2018-04-26: 0.2 mg via INTRAVENOUS

## 2018-04-26 MED ORDER — FENTANYL CITRATE (PF) 100 MCG/2ML IJ SOLN
INTRAMUSCULAR | Status: AC
  Filled 2018-04-26: qty 2

## 2018-04-26 MED ORDER — LIDOCAINE HCL (PF) 1 % IJ SOLN
INTRAMUSCULAR | Status: AC
  Filled 2018-04-26: qty 30

## 2018-04-26 MED ORDER — HYDROCODONE-ACETAMINOPHEN 7.5-325 MG PO TABS: 1.0000 | ORAL_TABLET | Freq: Once | ORAL | Status: DC | PRN

## 2018-04-26 MED ORDER — HEPARIN SOD (PORK) LOCK FLUSH 100 UNIT/ML IV SOLN
INTRAVENOUS | Status: AC
  Filled 2018-04-26: qty 5

## 2018-04-26 SURGICAL SUPPLY — 30 items
BAG DECANTER FOR FLEXI CONT (MISCELLANEOUS) ×3 IMPLANT
CHLORAPREP W/TINT 10.5 ML (MISCELLANEOUS) ×3 IMPLANT
CLOTH BEACON ORANGE TIMEOUT ST (SAFETY) ×3 IMPLANT
COVER LIGHT HANDLE STERIS (MISCELLANEOUS) ×6 IMPLANT
COVER WAND RF STERILE (DRAPES) ×3 IMPLANT
DECANTER SPIKE VIAL GLASS SM (MISCELLANEOUS) ×3 IMPLANT
DERMABOND ADVANCED (GAUZE/BANDAGES/DRESSINGS) ×2
DERMABOND ADVANCED .7 DNX12 (GAUZE/BANDAGES/DRESSINGS) ×1 IMPLANT
DRAPE C-ARM FOLDED MOBILE STRL (DRAPES) ×3 IMPLANT
ELECT REM PT RETURN 9FT ADLT (ELECTROSURGICAL) ×3
ELECTRODE REM PT RTRN 9FT ADLT (ELECTROSURGICAL) ×1 IMPLANT
GLOVE BIO SURGEON STRL SZ7 (GLOVE) ×3 IMPLANT
GLOVE BIOGEL PI IND STRL 7.0 (GLOVE) ×2 IMPLANT
GLOVE BIOGEL PI INDICATOR 7.0 (GLOVE) ×4
GLOVE SURG SS PI 7.5 STRL IVOR (GLOVE) ×3 IMPLANT
GOWN STRL REUS W/TWL LRG LVL3 (GOWN DISPOSABLE) ×6 IMPLANT
IV NS 500ML (IV SOLUTION) ×2
IV NS 500ML BAXH (IV SOLUTION) ×1 IMPLANT
KIT PORT POWER 8FR ISP MRI (Port) ×3 IMPLANT
KIT TURNOVER KIT A (KITS) ×3 IMPLANT
NEEDLE HYPO 25X1 1.5 SAFETY (NEEDLE) ×3 IMPLANT
PACK MINOR (CUSTOM PROCEDURE TRAY) ×3 IMPLANT
PAD ARMBOARD 7.5X6 YLW CONV (MISCELLANEOUS) ×3 IMPLANT
SET BASIN LINEN APH (SET/KITS/TRAYS/PACK) ×3 IMPLANT
SUT MNCRL AB 4-0 PS2 18 (SUTURE) ×3 IMPLANT
SUT VIC AB 3-0 SH 27 (SUTURE) ×2
SUT VIC AB 3-0 SH 27X BRD (SUTURE) ×1 IMPLANT
SYR 20CC LL (SYRINGE) ×3 IMPLANT
SYR 5ML LL (SYRINGE) ×3 IMPLANT
SYR CONTROL 10ML LL (SYRINGE) ×3 IMPLANT

## 2018-04-26 NOTE — Discharge Instructions (Signed)

## 2018-04-26 NOTE — Transfer of Care (Signed)
Immediate Anesthesia Transfer of Care Note  Patient: Alexander [name]  Procedure(s) Performed: INSERTION PORT-A-CATH (attached catheter in left subclavian) (Left Chest)  Patient Location: PACU  Anesthesia Type:MAC  Level of Consciousness: awake, alert , oriented and patient cooperative  Airway & Oxygen Therapy: Patient Spontanous Breathing  Post-op Assessment: Report given to RN and Post -op Vital signs reviewed and stable  Post vital signs: Reviewed and stable  Last Vitals:  Vitals Value Taken Time  BP 113/63 04/26/2018 12:16 PM  Temp 36.6 C 04/26/2018 12:15 PM  Pulse 58 04/26/2018 12:24 PM  Resp 15 04/26/2018 12:24 PM  SpO2 100 % 04/26/2018 12:24 PM  Vitals shown include unvalidated device data.  Last Pain:  Vitals:   04/26/18 1215  TempSrc:   PainSc: 0-No pain      Patients Stated Pain Goal: 2 (43/32/95 [id number])  Complications: No apparent anesthesia complications

## 2018-04-26 NOTE — Anesthesia Preprocedure Evaluation (Signed)
Anesthesia Evaluation    Airway Mallampati: II       Dental  (+) Upper Dentures, Lower Dentures   Pulmonary former smoker,    breath sounds clear to auscultation       Cardiovascular  Rhythm:regular     Neuro/Psych    GI/Hepatic GERD  ,  Endo/Other    Renal/GU      Musculoskeletal   Abdominal   Peds  Hematology   Anesthesia Other Findings Oropharyngeal CA, dysphagia Gastritis, gastrodudenitis  Reproductive/Obstetrics                             Anesthesia Physical Anesthesia Plan  ASA: IV  Anesthesia Plan: MAC   Post-op Pain Management:    Induction:   PONV Risk Score and Plan:   Airway Management Planned:   Additional Equipment:   Intra-op Plan:   Post-operative Plan:   Informed Consent: I have reviewed the patients History and Physical, chart, labs and discussed the procedure including the risks, benefits and alternatives for the proposed anesthesia with the patient or authorized representative who has indicated his/her understanding and acceptance.       Plan Discussed with: Anesthesiologist  Anesthesia Plan Comments:         Anesthesia Quick Evaluation

## 2018-04-26 NOTE — Op Note (Signed)
Patient:  Alexander [name]  DOB:  Oct 02, 1950  MRN:  [id number]   Preop Diagnosis: Oropharyngeal carcinoma, need for central venous access  Postop Diagnosis: Same  Procedure: Port-A-Cath insertion  Surgeon: Aviva Signs, MD  Anes: MAC  Indications: Patient is a 68 year old black male who presents for Port-A-Cath.  He is about to undergo chemotherapy for recurrent oropharyngeal carcinoma.  The risks and benefits of the procedure including bleeding, infection, and pneumothorax were fully explained to the patient, who gave informed consent.  Procedure note: The patient was placed in the Trendelenburg position after the left upper chest was prepped and draped using the usual sterile technique with ChloraPrep.  Surgical site confirmation was performed.  1% Xylocaine was used for local anesthesia.  An incision was made below the left clavicle.  A subcutaneous pocket was formed.  A needle was advanced into the left subclavian vein using the Seldinger technique without difficulty.  The guidewire was then advanced into the right atrium under fluoroscopic guidance.  An introducer and peel-away sheath were placed over the guidewire.  The catheter was then inserted through the peel-away sheath and the peel-away sheath was removed.  The catheter was then attached to the port and the port placed in subcutaneous pocket.  Adequate positioning was confirmed by fluoroscopy.  Good backflow blood was noted on aspiration of the port.  The port was flushed with heparin flush.  Subcutaneous layer was reapproximated using a 3-0 Vicryl interrupted suture.  The skin was closed using a 4-0 Monocryl subcuticular suture.  Dermabond was applied.  All tape and needle counts were correct at the end of the procedure.  The patient was awakened and transferred to PACU in stable condition.  A chest x-ray will be performed at that time.  Complications: None  EBL: Minimal  Specimen: None

## 2018-04-26 NOTE — Anesthesia Procedure Notes (Signed)
Procedure Name: [place] Performed by: Andree Elk Amy A, CRNA Pre-anesthesia Checklist: Patient identified, Emergency Drugs available, Suction available, Timeout performed and Patient being monitored Patient Re-evaluated:Patient Re-evaluated prior to induction Oxygen Delivery Method: Nasal Cannula

## 2018-04-26 NOTE — Anesthesia Postprocedure Evaluation (Signed)
Anesthesia Post Note  Patient: Alexander [name]  Procedure(s) Performed: INSERTION PORT-A-CATH (attached catheter in left subclavian) (Left Chest)  Patient location during evaluation: PACU Anesthesia Type: MAC Level of consciousness: awake and alert and oriented Pain management: pain level controlled Vital Signs Assessment: post-procedure vital signs reviewed and stable Respiratory status: spontaneous breathing Cardiovascular status: stable Postop Assessment: no apparent nausea or vomiting Anesthetic complications: no     Last Vitals:  Vitals:   04/26/18 1015 04/26/18 1215  BP: (!) 114/59   Pulse: (!) 52 (!) 56  Temp: 36.6 C 36.6 C  SpO2: 99% 99%    Last Pain:  Vitals:   04/26/18 1215  TempSrc:   PainSc: 0-No pain                 ,  A

## 2018-04-26 NOTE — Interval H&P Note (Signed)
History and Physical Interval Note:  04/26/2018 10:50 AM  Alexander [name]  has presented today for surgery, with the diagnosis of oropharyngeal cancer  The various methods of treatment have been discussed with the patient and family. After consideration of risks, benefits and other options for treatment, the patient has consented to  Procedure(s): INSERTION PORT-A-CATH (Left) as a surgical intervention .  The patient's history has been reviewed, patient examined, no change in status, stable for surgery.  I have reviewed the patient's chart and labs.  Questions were answered to the patient's satisfaction.     Aviva Signs

## 2018-04-29 ENCOUNTER — Ambulatory Visit (HOSPITAL_COMMUNITY): Payer: Self-pay | Admitting: Internal Medicine

## 2018-04-29 ENCOUNTER — Encounter (HOSPITAL_COMMUNITY): Payer: Self-pay | Admitting: Internal Medicine

## 2018-04-29 ENCOUNTER — Other Ambulatory Visit (HOSPITAL_COMMUNITY): Payer: Self-pay | Admitting: Pharmacist

## 2018-04-29 ENCOUNTER — Inpatient Hospital Stay (HOSPITAL_COMMUNITY): Payer: Medicare Other | Attending: Hematology | Admitting: Internal Medicine

## 2018-04-29 ENCOUNTER — Encounter (HOSPITAL_COMMUNITY): Payer: Self-pay | Admitting: General Surgery

## 2018-04-29 ENCOUNTER — Other Ambulatory Visit: Payer: Self-pay

## 2018-04-29 VITALS — BP 136/68 | HR 56 | Temp 98.2°F | Resp 16 | Wt 152.2 lb

## 2018-04-29 DIAGNOSIS — C109 Malignant neoplasm of oropharynx, unspecified: Secondary | ICD-10-CM | POA: Diagnosis not present

## 2018-04-29 DIAGNOSIS — R1310 Dysphagia, unspecified: Secondary | ICD-10-CM | POA: Insufficient documentation

## 2018-04-29 DIAGNOSIS — Z79899 Other long term (current) drug therapy: Secondary | ICD-10-CM | POA: Insufficient documentation

## 2018-04-29 DIAGNOSIS — R52 Pain, unspecified: Secondary | ICD-10-CM

## 2018-04-29 DIAGNOSIS — E039 Hypothyroidism, unspecified: Secondary | ICD-10-CM

## 2018-04-29 DIAGNOSIS — Z87891 Personal history of nicotine dependence: Secondary | ICD-10-CM | POA: Diagnosis not present

## 2018-04-29 DIAGNOSIS — Z5112 Encounter for antineoplastic immunotherapy: Secondary | ICD-10-CM | POA: Diagnosis present

## 2018-04-29 DIAGNOSIS — R972 Elevated prostate specific antigen [PSA]: Secondary | ICD-10-CM | POA: Insufficient documentation

## 2018-04-29 DIAGNOSIS — Z5111 Encounter for antineoplastic chemotherapy: Secondary | ICD-10-CM | POA: Diagnosis not present

## 2018-04-29 NOTE — Progress Notes (Signed)
Diagnosis Oropharyngeal carcinoma ([place]) - Plan: CBC with Differential/Platelet, Comprehensive metabolic panel, Lactate dehydrogenase, T4 AND TSH  Staging Cancer Staging Oropharyngeal carcinoma (HCC) Staging form: Pharynx - Oropharynx, AJCC 7th Edition - Clinical: Stage IVA (T4a, N2b, M0) - Unsigned   Assessment and Plan:   1.  Stage IVA Oropharyngeal cancer.  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 S/P curative concomitant chemo/XRT with remission on PET imaging in October 2016.  He was previously last seen at [facility] in 05/2016 and has failed to keep follow-up appointments.  He called in requesting pain medications.  Previously, he was instructed he would no longer be prescribed opiods by PA Kefalas.    Pt was recommended for PET scan but did not have imaging done.  He was set up for CT neck CAP that was done on 09/20/2017 and showed   IMPRESSION:  CT neck:  1. Limited assessment of the pharynx due to motion. 2. Decreased size of low-density left level II lymph node. No evidence of new or progressive cervical lymphadenopathy.  1. Interval development of bulky mediastinal adenopathy concerning for metastatic disease. 2. Interval development of pulmonary nodules within the left lower lobe concerning for the possibility of pulmonary metastatic disease. 3. No definite metastatic disease identified within the abdomen or pelvis. Multiple hepatic cysts are demonstrated.  Pt was referred to Dr. Benjamine Mola and was seen by him on 10/07/2017 and recommended PET/CT.  Pt did not have imaging done.  He has smoking history and reports that he quit smoking but started Vaping.    PET scan done 02/18/2018 reviewed and showed  IMPRESSION: 1. Interval development of hypermetabolic mediastinal and left hilar lymphadenopathy associated with small hypermetabolic left lung nodules. Metastatic disease a distinct consideration and left lung primary  with metastatic disease to the left hilum and mediastinum not excluded. 2. Persistent hypermetabolic activity in the right prostate gland. 3. Similar appearance of diffuse FDG uptake in the tongue and muscles of the left neck, likely related to movement.  Pt was referred to CT surgery for evaluation for EBUS/Bronch for tissue diagnosis of lung findings.    Pt underwent Video bronchoscopy with brushings and endobronchial biopsies and endobronchial ultrasound with mediastinal lymph node needle aspirations on 04/15/2018  SURGEON:  Modesto Charon, MD  FINDINGS:  Markedly enlarged 4R and 7 lymph nodes, mucosal tumor and left main stem bronchus, likely due to invasion from the subcarinal lymph nodes.  Brushings showed carcinoma.  Path  Bronchus, biopsy, Left Mainstem done 04/15/2018 - SQUAMOUS CELL CARCINOMA, BASALOID.  Basaloid SCCs are frequently associated with HPV 16 positivity and are most common in the oropharynx, ie, the tonsil or base of tongue which indicates  findings likely originated from original head and neck cancer history.   MRI of brain to complete staging evaluation was done 04/25/2018 and was negative for metastatic disease.    I have also discussed with him options of therapy with Taxol 175 mg/m2 with carboplatin AUC of 5 every 3 weeks with Keytruda 200 mg IV every 3 weeks based on results of Keynote -48 trial that established Keytruda for metastatic SCC of head and neck with platinum based therapy . The addition of Keytruda to platinum based therapy improved OS.  In pts with large tumor burden, the regimen also showed more rapid tumor response.  Goals of therapy are disease control.  Will discuss case with RT in [place].    Have requested PDL1, and P16 testing.  Pt has undergone Port placement.  All questions answered and he expressed understanding of the information presented.  Pt provided written information regarding therapy regimen.    2.  Dysphagia.  Pt was seen by GI  and underwent EGD on 01/15/2018 and showed esophagitis, gastritis and hiatal hernia.  Pt was dilated.   Follow-up with GI as directed.  He feels symptoms improved with dilatation.  Pt was seen by Dr. Benjamine Mola 09/24/2017 and had laryngoscopy done that was negative for recurrent mass or lesion.    3.  Pain.  Pt was previously referred to pain clinic for evaluation and management.    4.  RI.  Labs done 04/15/2018 reviewed and showed WBC 4.2 HB 14.4 plts 204,000.  Chemistries WNL with K+ 4.4 and normal LFTs.  Cr 1.90.  Pt previously referred to nephrology. Pt was treated in the past with Cisplatin.  Will need to closely follow renal function with Carboplatin.    5.  Hypothyroidism.  TSH noted to be elevated at 8.8 on labs done 08/10/2017.  He has not followed up with PCP or endocrinology.  Pt not on synthroid.  Prescribed Synthroid 25 mcg po daily initially and follow TFTs due to risk for worsening hypothyroidism with Keytruda.    6.  Noncompliance. Pt has not followed up with Dr. Benjamine Mola or this office and has not had imaging done as previously recommended. Social work evaluation to assess deterrent to compliance.    7.  Smoking.  Pt reports he is no longer smoking cigarettes but he is vaping.  I have discussed with him potential risks of vaping as well as smoking.    25 minutes spent with more than 50% spent in counseling and coordination of care.    Interval History: Historical data obtained from note dated 08/14/2017.   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 S/P curative concomitant chemo/XRT with remission on PET imaging in October 2016.  Current Status:  Pt is seen today for follow-up.  He is here to go over [place].  He denies chest wall discomfort.      Oropharyngeal carcinoma ([place])   07/27/2014 Imaging    CT neck- Advanced stage oropharyngeal cancer with necrotic adenopathy accounting for the left neck swelling.    07/28/2014 Initial Diagnosis    Oropharyngeal  cancer    08/03/2014 Imaging    CT CAP- L supraclavicular lymphadenopathy is not completely visualized. This is better seen on the previous neck CT from 07/27/2014. Otherwise, no evidence for metastatic disease in the chest, abdomen, or pelvis.    08/03/2014 Imaging    Bone scan- Uptake at adjacent anterior LEFT 6, 7, 8 ribs likely representing trauma/fractures. Questionable nonspecific increased tracer localization at the posterior RIGHT 8th and 9th ribs, the adjacent nature which raises a a question of trauma as well    08/06/2014 Pathology Results    Dr. Benjamine Mola- Oropharynx, biopsy, Left - INVASIVE SQUAMOUS CELL CARCINOMA.    08/12/2014 Procedure    Dr. Enrique Sack- 1. Multiple extraction of tooth numbers 6, 17, 22, 23, 24, 25, 26, and 27. 3 Quadrants of alveoloplasty    08/17/2014 Pathology Results    PORT and G-TUBE placed by Dr. Carlis Stable.    08/26/2014 PET scan    Large hypermetabolic mass in the left base of tongue. Activity extends across midline to the right base tongue. 2. Intensely hypermetabolic left cervical metastatic lymph nodes. Lymph nodes extend from the left level II position to the  left supraclavi    09/01/2014 - 09/22/2014 Chemotherapy    Concurrent chemoradiation with Cisplatin 100 mg/m2 x 2 cycles with Neulasta support. Held cycle #3 d/t renal toxicity.     09/03/2014 - 10/23/2014 Radiation Therapy    Treated in [place], IMRT Isidore Moos).  Base of tongue and bilat neck. Total dose: 70 Gy in 35 fractions. (of note, he did miss several treatments requiring BID dosing towards the end of treatment).     01/25/2015 PET scan    Near complete resolution of metabolic activity at the base of tongue. Minimal residual activity is likely post treatment effect. 2. Complete resolution of metabolic activity above LEFT cervical lymph nodes. No evidence of residual metabolically active     12/19/4626 Procedure    Port-a-cath removed [name])     04/30/2018 -  Chemotherapy    The patient had pembrolizumab  (KEYTRUDA) 200 mg in sodium chloride 0.9 % 50 mL chemo infusion, 200 mg, Intravenous, Once, 0 of 6 cycles  for chemotherapy treatment.     04/30/2018 -  Chemotherapy    The patient had palonosetron (ALOXI) injection 0.25 mg, 0.25 mg, Intravenous,  Once, 0 of 3 cycles pegfilgrastim-cbqv (UDENYCA) injection 6 mg, 6 mg, Subcutaneous, Once, 0 of 3 cycles CARBOplatin (PARAPLATIN) in sodium chloride 0.9 % 100 mL chemo infusion, , Intravenous,  Once, 0 of 3 cycles PACLitaxel (TAXOL) 330 mg in sodium chloride 0.9 % 500 mL chemo infusion (> 48m/m2), 175 mg/m2 = 330 mg (original dose ), Intravenous,  Once, 0 of 3 cycles Dose modification: 175 mg/m2 (Cycle 1, Reason: Patient Age)  for chemotherapy treatment.       Problem List Patient Active Problem List   Diagnosis Date Noted  . Gastritis and gastroduodenitis [K29.70, KK73.90]  . Dysphagia [R13.10] 11/09/2017  . History of oropharyngeal cancer [Z85.819] 11/09/2017  . [place]ropharyngeal carcinoma ([place] [C10.9] 07/28/2014    Past Medical History Past Medical History:  Diagnosis Date  . GERD (gastroesophageal reflux disease)   . 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 [place] cancer ([place] 07/[place]/2014   dx. 3 weeks ago.- [place] [place]  .Marland Kitchen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[place] 08/06/14   SCCa of Left BOT    Past Surgical History Past Surgical History:  Procedure Laterality Date  . BIOPSY  01/15/2018   Procedure: BIOPSY;  Surgeon: FDanie Binder MD;  [facility];  Service: Endoscopy;;  gastric  . COLONOSCOPY N/A 03/13/2016   Procedure: COLONOSCOPY;  Surgeon: SDanie Binder MD;  [facility];  Service: Endoscopy;  Laterality: N/A;  2:15 PM  . ESOPHAGOGASTRODUODENOSCOPY (EGD) WITH PROPOFOL N/A 08/17/2014   Procedure: ESOPHAGOGASTRODUODENOSCOPY (EGD) WITH PROPOFOL (procedure #1);  Surgeon: MAviva SignsMd, MD;  Location: [facility];  Service:  General;  Laterality: N/A;  . ESOPHAGOGASTRODUODENOSCOPY (EGD) WITH PROPOFOL N/A 01/15/2018   Procedure: ESOPHAGOGASTRODUODENOSCOPY (EGD) WITH PROPOFOL;  Surgeon: FDanie Binder MD;  [facility];  Service: Endoscopy;  Laterality: N/A;  9:30am  . MULTIPLE EXTRACTIONS WITH ALVEOLOPLASTY N/A 08/12/2014   Procedure: Extraction of tooth #'s 6,17,22,23,24,25,26,27 with alveoloplasty;  Surgeon: RLenn Cal DDS;  [facility]: [facility];  Service: Oral Surgery;  Laterality: N/A;  . PANENDOSCOPY N/A 08/06/2014   Procedure: PANENDOSCOPY WITH BIOPSY;  Surgeon: SLeta Baptist MD;  [place] [facility]  Service: ENT;  Laterality: N/A;  . PEG PLACEMENT Left 08/17/14  . PEG PLACEMENT N/A  08/17/2014   Procedure: PERCUTANEOUS ENDOSCOPIC GASTROSTOMY (PEG) PLACEMENT (procedure #1);  Surgeon: Aviva Signs Md, MD;  Location: [facility];  Service: General;  Laterality: N/A;  . PORT-A-CATH REMOVAL Right 07/17/2016   Procedure: MINOR REMOVAL PORT-A-CATH;  Surgeon: Aviva Signs, MD;  Location: [facility];  Service: General;  Laterality: Right;  . PORTACATH PLACEMENT Right 08/17/14  . PORTACATH PLACEMENT Right 08/17/2014   Procedure: INSERTION PORT-A-CATH (procedure #2);  Surgeon: Aviva Signs Md, MD;  Location: [facility];  Service: General;  Laterality: Right;  . PORTACATH PLACEMENT Left 04/26/2018   Procedure: INSERTION PORT-A-CATH (attached catheter in left subclavian);  Surgeon: Aviva Signs, MD;  Location: [facility];  Service: General;  Laterality: Left;  . SAVORY DILATION N/A 01/15/2018   Procedure: SAVORY DILATION;  Surgeon: Danie Binder, MD;  [facility];  Service: Endoscopy;  Laterality: N/A;  . VIDEO BRONCHOSCOPY WITH ENDOBRONCHIAL ULTRASOUND N/A 04/15/2018   Procedure: VIDEO BRONCHOSCOPY WITH ENDOBRONCHIAL ULTRASOUND;  Surgeon: Melrose Nakayama, MD;  Location: [facility];  Service: Thoracic;  Laterality: N/A;    Family History Family History  Problem Relation Age of Onset  . Colon cancer  Neg Hx   . Gastric cancer Neg Hx   . Esophageal cancer Neg Hx      Social History  reports that he quit smoking about 3 years ago. He has a 15.00 pack-year smoking history. He has never used smokeless tobacco. He reports previous alcohol use. He reports that he does not use drugs.  Medications  Current Outpatient Medications:  .  feeding supplement, ENSURE ENLIVE, (ENSURE ENLIVE) LIQD, Take 237 mLs by mouth 2 (two) times daily., Disp: , Rfl:  .  HYDROcodone-acetaminophen (NORCO) 5-325 MG tablet, Take 1 tablet by mouth every 4 (four) hours as needed for moderate pain., Disp: 30 tablet, Rfl: 0 .  levothyroxine (SYNTHROID) 25 MCG tablet, Take 1 tablet (25 mcg total) by mouth daily before breakfast., Disp: 30 tablet, Rfl: 1 .  omeprazole (PRILOSEC) 20 MG capsule, 1 PO 30 MINS PRIOR TO BREAKFAST. (Patient taking differently: Take 20 mg by mouth daily before breakfast. 1 PO 30 MINS PRIOR TO BREAKFAST.), Disp: 90 capsule, Rfl: 3  Allergies Patient has no known allergies.  Review of Systems Review of Systems - Oncology ROS negative   Physical Exam  Vitals Wt Readings from Last 3 Encounters:  04/29/18 152 lb 3 oz (69 kg)  04/26/18 150 lb 12.8 oz (68.4 kg)  04/23/18 150 lb 12.8 oz (68.4 kg)   Temp Readings from Last 3 Encounters:  04/29/18 98.2 F (36.8 C) (Oral)  04/26/18 97.8 F (36.6 C)  04/23/18 98.4 F (36.9 C) (Temporal)   BP Readings from Last 3 Encounters:  04/29/18 136/68  04/26/18 127/72  04/23/18 (!) 110/92   Pulse Readings from Last 3 Encounters:  04/29/18 (!) 56  04/26/18 (!) 54  04/23/18 (!) 55   Constitutional: Well-developed, well-nourished, and in no distress.   HENT: Head: Normocephalic and atraumatic.  Mouth/Throat: No oropharyngeal exudate. Mucosa moist. Eyes: Pupils are equal, round, and reactive to light. Conjunctivae are normal. No scleral icterus.  Neck: Normal range of motion. Neck supple. No JVD present.  Cardiovascular: Normal rate, regular  rhythm and normal heart sounds.  Exam reveals no gallop and no friction rub.   No murmur heard. Pulmonary/Chest: Effort normal and breath sounds normal. No respiratory distress. No wheezes.No rales.  Abdominal: Soft. Bowel sounds are normal. No distension. There is no tenderness. There is no guarding.  Musculoskeletal: No  edema or tenderness.  Lymphadenopathy: No cervical, axillary or supraclavicular adenopathy.  Neurological: Alert and oriented to person, place, and time. No cranial nerve deficit.  Skin: Skin is warm and dry. No rash noted. No erythema. No pallor.  Psychiatric: Affect and judgment normal.   Labs No visits with results within 3 Day(s) from this visit.  Latest known visit with results is:  Admission on 04/15/2018, Discharged on 04/15/2018  Component Date Value Ref Range Status  . aPTT 04/15/2018 38* 24 - 36 seconds Final   Comment:        IF BASELINE aPTT IS ELEVATED, SUGGEST PATIENT RISK ASSESSMENT BE USED TO DETERMINE APPROPRIATE ANTICOAGULANT THERAPY. Performed at [facility], [place] [address], [place], [place] [address]   . WBC 04/15/2018 4.2  4.0 - 10.5 K/uL Final  . RBC 04/15/2018 4.87  4.22 - 5.81 MIL/uL Final  . Hemoglobin 04/15/2018 14.4  13.0 - 17.0 g/dL Final  . HCT 04/15/2018 46.0  39.0 - 52.0 % Final  . MCV 04/15/2018 94.5  80.0 - 100.0 fL Final  . MCH 04/15/2018 29.6  26.0 - 34.0 pg Final  . MCHC 04/15/2018 31.3  30.0 - 36.0 g/dL Final  . RDW 04/15/2018 14.2  11.5 - 15.5 % Final  . Platelets 04/15/2018 204  150 - 400 K/uL Final  . nRBC 04/15/2018 0.0  0.0 - 0.2 % Final   Performed at [facility], [place] [address], [place], [place] [address]  . Sodium 04/15/2018 141  135 - 145 mmol/L Final  . Potassium 04/15/2018 4.4  3.5 - 5.1 mmol/L Final  . Chloride 04/15/2018 105  98 - 111 mmol/L Final  . CO2 04/15/2018 22  22 - 32 mmol/L Final  . Glucose, Bld 04/15/2018 76  70 - 99 mg/dL Final  . BUN 04/15/2018 21  8 - 23 mg/dL Final  . Creatinine,  Ser 04/15/2018 1.90* 0.61 - 1.24 mg/dL Final  . Calcium 04/15/2018 9.4  8.9 - 10.3 mg/dL Final  . Total Protein 04/15/2018 7.4  6.5 - 8.1 g/dL Final  . Albumin 04/15/2018 4.1  3.5 - 5.0 g/dL Final  . AST 04/15/2018 17  15 - 41 U/L Final  . ALT 04/15/2018 13  0 - 44 U/L Final  . Alkaline Phosphatase 04/15/2018 51  38 - 126 U/L Final  . Total Bilirubin 04/15/2018 0.5  0.3 - 1.2 mg/dL Final  . GFR calc non Af Amer 04/15/2018 35* >60 mL/min Final  . GFR calc Af Amer 04/15/2018 41* >60 mL/min Final  . Anion gap 04/15/2018 14  5 - 15 Final   Performed at [facility], [place] [address], [place], [place] [address]  . Prothrombin Time 04/15/2018 13.2  11.4 - 15.2 seconds Final  . INR 04/15/2018 1.01   Final   Performed at [facility], [place] [address], [place], [place] [address]     Pathology Orders Placed This Encounter  Procedures  . CBC with Differential/Platelet    Standing Status:   Future    Standing Expiration Date:   04/30/2019  . Comprehensive metabolic panel    Standing Status:   Future    Standing Expiration Date:   04/30/2019  . Lactate dehydrogenase    Standing Status:   Future    Standing Expiration Date:   04/30/2019  . T4 AND TSH    Standing Status:   Future    Standing Expiration Date:   04/30/2019  Zoila Shutter MD

## 2018-04-29 NOTE — Progress Notes (Signed)
Faxed referral and patient records to [facility] [phone].  Transmission confirmation received.

## 2018-05-01 ENCOUNTER — Other Ambulatory Visit (HOSPITAL_COMMUNITY): Payer: Self-pay | Admitting: *Deleted

## 2018-05-01 MED ORDER — ONDANSETRON HCL 8 MG PO TABS: 8.0000 mg | ORAL_TABLET | Freq: Two times a day (BID) | ORAL | 1 refills | Status: DC | PRN

## 2018-05-01 MED ORDER — DEXAMETHASONE 4 MG PO TABS: 8.0000 mg | ORAL_TABLET | Freq: Every day | ORAL | 1 refills | Status: DC

## 2018-05-01 MED ORDER — LIDOCAINE-PRILOCAINE 2.5-2.5 % EX CREA: TOPICAL_CREAM | CUTANEOUS | 3 refills | Status: DC

## 2018-05-01 MED ORDER — LORAZEPAM 0.5 MG PO TABS: 0.5000 mg | ORAL_TABLET | Freq: Four times a day (QID) | ORAL | 0 refills | Status: DC | PRN

## 2018-05-01 MED ORDER — PROCHLORPERAZINE MALEATE 10 MG PO TABS: 10.0000 mg | ORAL_TABLET | Freq: Four times a day (QID) | ORAL | 1 refills | Status: DC | PRN

## 2018-05-01 NOTE — Patient Instructions (Signed)
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  You will receive Taxol and carboplatin every 3 weeks to treat your cancer.  Although your cancer is not considered curable, the intent of treatment is to control your cancer - to keep it from growing and spreading further. You will see the doctor regularly throughout treatment.  We monitor your lab work prior to every treatment. The doctor monitors your response to treatment by the way you are feeling, your blood work, and scans periodically.  There will be wait times while you are here for treatment.  It will take about 30 minutes to 1 hour for your lab work to result.  Then there will be wait times while pharmacy mixes your medications.   Aloxi -  High powered nausea medication given to reduce the risk of chemotherapy induced nausea  Emend - high powered nausea/vomiting prevention medication used for chemotherapy patients Pepcid - antihistamine helps to prevent allergic reaction to chemotherapy Benadryl - antihistamine helps to prevent allergic reaction to chemotherapy  Udenyca- this medication is not chemo but being given because you have had chemo. This medication works by boosting your bone marrow's supply of white blood cells. White blood cells are what protect our bodies against infection. The medication is given in the form of a subcutaneous injection. It is given in the fatty tissue of the back of your arm.  The major side effect of this medication is bone or muscle pain. The drug of choice to relieve or lessen the pain is Aleve or Ibuprofen. If a physician has ever told you not to take Aleve or Ibuprofen - then don't take it. You should then take Tylenol/acetaminophen. Take either medication as the bottle directs you to.  The level of pain you experience as a result of this injection can range from none, to mild or moderate, or severe. Please let us know if you develop  moderate or severe bone pain.   You can take Claritin 10 mg over the counter for a few days after receiving neulasta to help with the bone aches and pains.  Carboplatin (Paraplatin, CBDCA)  About This Drug  Carboplatin is used to treat cancer. It is given through your port-a-cath. It will take 30 minutes to infuse.   Possible Side Effects  . Bone marrow suppression. This is a decrease in the number of white blood cells, red blood cells, and platelets. This may raise your risk of infection, make you tired and weak (fatigue), and raise your risk of bleeding. . Nausea and vomiting (throwing up) . Weakness . Changes in your liver function . Changes in your kidney function . Electrolyte changes . Pain  Note: Each of the side effects above was reported in 20% or greater of patients treated with carboplatin. Not all possible side effects are included above.  Warnings and Precautions  . Severe bone marrow suppression . Allergic reactions, including anaphylaxis are rare but may happen in some patients. Signs of allergic reaction to this drug may be swelling of the face, feeling like your tongue or throat are swelling, trouble breathing, rash, itching, fever, chills, feeling dizzy, and/or feeling that your heart is beating in a fast or not normal way. If this happens, do not take another dose of this drug. You should get urgent medical treatment. . Severe nausea and vomiting . Effects on the nerves are called peripheral neuropathy. This risk is increased if you are over  the age of 18 or if you have received other medicine with risk of peripheral neuropathy. You may feel numbness, tingling, or pain in your hands and feet. It may be hard for you to button your clothes, open jars, or walk as usual. The effect on the nerves may get worse with more doses of the drug. These effects get better in some people after the drug is stopped but it does not get better in all people. Marland Kitchen Blurred vision,  loss of vision or other changes in eyesight . Decreased hearing . Skin and tissue irritation including redness, pain, warmth, or swelling at the IV site if the drug leaks out of the vein and into nearby tissue. . Severe changes in your kidney function, which can cause kidney failure . Severe changes in your liver function, which can cause liver failure  Note: Some of the side effects above are very rare. If you have concerns and/or questions, please discuss them with your medical team.  Important Information  . This drug may be present in the saliva, tears, sweat, urine, stool, vomit, semen, and vaginal secretions. Talk to your doctor and/or your nurse about the necessary precautions to take during this time.  Treating Side Effects  . Manage tiredness by pacing your activities for the day. . Be sure to include periods of rest between energy-draining activities. . To decrease the risk of infection, wash your hands regularly. . Avoid close contact with people who have a cold, the flu, or other infections. . Take your temperature as your doctor or nurse tells you, and whenever you feel like you may have a fever. . To help decrease the risk of bleeding, use a soft toothbrush. Check with your nurse before using dental floss. . Be very careful when using knives or tools. . Use an electric shaver instead of a razor. . Drink plenty of fluids (a minimum of eight glasses per day is recommended). . If you throw up or have loose bowel movements, you should drink more fluids so that you do not become dehydrated (lack of water in the body from losing too much fluid). . To help with nausea and vomiting, eat small, frequent meals instead of three large meals a day. Choose foods and drinks that are at room temperature. Ask your nurse or doctor about other helpful tips and medicine that is available to help stop or lessen these symptoms. . If you have numbness and tingling in your hands and feet, be  careful when cooking, walking, and handling sharp objects and hot liquids. Marland Kitchen Keeping your pain under control is important to your well-being. Please tell your doctor or nurse if you are experiencing pain.  Food and Drug Interactions  . There are no known interactions of carboplatin with food. . This drug may interact with other medicines. Tell your doctor and pharmacist about all the prescription and over-the-counter medicines and dietary supplements (vitamins, minerals, herbs and others) that you are taking at this time. Also, check with your doctor or pharmacist before starting any new prescription or over-the-counter medicines, or dietary supplements to make sure that there are no interactions.  When to Call the Doctor  Call your doctor or nurse if you have any of these symptoms and/or any new or unusual symptoms:  . Fever of 100.4 F (38 C) or higher . Chills . Tiredness that interferes with your daily activities . Feeling dizzy or lightheaded . Easy bleeding or bruising . Nausea that stops you from eating or  drinking and/or is not relieved by prescribed medicines . Throwing up more than 3 times a day . Blurred vision or other changes in eyesight . Decrease in hearing or ringing in the ear . Signs of allergic reaction: swelling of the face, feeling like your tongue or throat are swelling, trouble breathing, rash, itching, fever, chills, feeling dizzy, and/or feeling that your heart is beating in a fast or not normal way. If this happens, call 911 for emergency care. . While you are getting this drug, please tell your nurse right away if you have any pain, redness, or swelling at the site of the IV infusion . Signs of possible liver problems: dark urine, pale bowel movements, bad stomach pain, feeling very tired and weak, unusual itching, or yellowing of the eyes or skin . Decreased urine, or very dark urine . Numbness, tingling, or pain in your hands and feet . Pain that does  not go away or is not relieved by prescribed medicine . If you think you may be pregnant  Reproduction Warnings  . Pregnancy warning: This drug may have harmful effects on the unborn baby. Women of child bearing potential should use effective methods of birth control during your cancer treatment. Let your doctor know right away if you think you may be pregnant. . Breastfeeding warning: It is not known if this drug passes into breast milk. For this reason, women should not breastfeed during treatment because this drug could enter the breast milk and cause harm to a breastfeeding baby. . Fertility warning: Human fertility studies have not been done with this drug. Talk with your doctor or nurse if you plan to have children. Ask for information on sperm or egg banking.  Paclitaxel (Taxol)  Paclitaxel is a drug used to treat cancer. It is given in the vein (IV).  This will take 3 hours to infuse.  The first time this drug is given it will take longer to infuse because it is increased slowly to monitor for reactions.  The nurse will be in the room with you for the first 15 minutes.   Possible Side Effects  . Hair loss. Hair loss is often temporary, although with certain medicine, hair loss can sometimes be permanent. Hair loss may happen suddenly or gradually. If you lose hair, you may lose it from your head, face, armpits, pubic area, chest, and/or legs. You may also notice your hair getting thin. . Swelling of your legs, ankles and/or feet (edema) . Flushing . Nausea and throwing up (vomiting) . Loose bowel movements (diarrhea) . Bone marrow depression. This is a decrease in the number of white blood cells, red blood cells, and platelets. This may raise your risk of infection, make you tired and weak (fatigue), and raise your risk of bleeding. . Effects on the nerves are called peripheral neuropathy. You may feel numbness, tingling, or pain in your hands and feet. It may be hard for you to  button your clothes, open jars, or walk as usual. The effect on the nerves may get worse with more doses of the drug. These effects get better in some people after the drug is stopped but it does not get better in all people. . Changes in your liver function . Bone, joint and muscle pain . Abnormal EKG . Allergic reaction: Allergic reactions, including anaphylaxis are rare but may happen in some patients. Signs of allergic reaction to this drug may be swelling of the face, feeling like your tongue or throat are  swelling, trouble breathing, rash, itching, fever, chills, feeling dizzy, and/or feeling that your heart is beating in a fast or not normal way. If this happens, do not take another dose of this drug. You should get urgent medical treatment. . Infection . Changes in your kidney function.  Note: Each of the side effects above was reported in 20% or greater of patients treated with paclitaxel. Not all possible side effects are included above.  Warnings and Precautions  . Severe bone marrow depression  Side Effects  . To help with hair loss, wash with a mild shampoo and avoid washing your hair every day. . Avoid rubbing your scalp, instead, pat your hair or scalp dry . Avoid coloring your hair . Limit your use of hair spray, electric curlers, blow dryers, and curling irons. . If you are interested in getting a wig, talk to your nurse. You can also call the [place] at 800-ACS-2345 to find out information about the "Look Good, Feel Better" program close to where you live. It is a free program where women getting chemotherapy can learn about wigs, turbans and scarves as well as makeup techniques and skin and nail care. . Ask your doctor or nurse about medicines that are available to help stop or lessen diarrhea and/or nausea. . To help with nausea and vomiting, eat small, frequent meals instead of three large meals a day. Choose foods and drinks that are at room temperature.  Ask your nurse or doctor about other helpful tips and medicine that is available to help or stop lessen these symptoms. . If you get diarrhea, eat low-fiber foods that are high in protein and calories and avoid foods that can irritate your digestive tracts or lead to cramping. Ask your nurse or doctor about medicine that can lessen or stop your diarrhea. . Mouth care is very important. Your mouth care should consist of routine, gentle cleaning of your teeth or dentures and rinsing your mouth with a mixture of 1/2 teaspoon of salt in 8 ounces of water or  teaspoon of baking soda in 8 ounces of water. This should be done at least after each meal and at bedtime. . If you have mouth sores, avoid mouthwash that has alcohol. Also avoid alcohol and smoking because they can bother your mouth and throat. . Drink plenty of fluids (a minimum of eight glasses per day is recommended). . Take your temperature as your doctor or nurse tells you, and whenever you feel like you may have a fever. . Talk to your doctor or nurse about precautions you can take to avoid infections and bleeding. . Be careful when cooking, walking, and handling sharp objects and hot liquids.  Food and Drug Interactions  . There are no known interactions of paclitaxel with food. . This drug may interact with other medicines. Tell your doctor and pharmacist about all the medicines and dietary supplements (vitamins, minerals, herbs and others) that you are taking at this time. . The safety and use of dietary supplements and alternative diets are often not known. Using these might affect your cancer or interfere with your treatment. Until more is known, you should not use dietary supplements or alternative diets without your cancer doctor's help.  When to Call the Doctor  Call your doctor or nurse if you have any of the following symptoms and/or any new or unusual symptoms:  . Fever of 100.5 F (38 C) or above . Chills . Redness, pain,  warmth, or swelling at  the IV site during the infusion . Signs of allergic reaction: swelling of the face, feeling like your tongue or throat are swelling, trouble breathing, rash, itching, fever, chills, feeling dizzy, and/or feeling that your heart is beating in a fast or not normal way . Feeling that your heart is beating in a fast or not normal way (palpitations) . Weight gain of 5 pounds in one week (fluid retention) . Decreased urine or very dark urine . Signs of liver problems: dark urine, pale bowel movements, bad stomach pain, feeling very tired and weak, unusual itching, or yellowing of the eyes or skin . Heavy menstrual period that lasts longer than normal . Easy bruising or bleeding . Nausea that stops you from eating or drinking, and/or that is not relieved by prescribed medicines. . Loose bowel movements (diarrhea) more than 4 times a day or diarrhea with weakness or lightheadedness . Pain in your mouth or throat that makes it hard to eat or drink . Lasting loss of appetite or rapid weight loss of five pounds in a week . Signs of peripheral neuropathy: numbness, tingling, or decreased feeling in fingers or toes; trouble walking or changes in the way you walk; or feeling clumsy when buttoning clothes, opening jars, or other routine activities . Joint and muscle pain that is not relieved by prescribed medicines . Extreme fatigue that interferes with normal activities . While you are getting this drug, please tell your nurse right away if you have any pain, redness, or swelling at the site of the IV infusion. . If you think you are pregnant.  Reproduction Warnings  . Pregnancy warning: This drug may have harmful effects on the unborn child, it is recommended that effective methods of birth control should be used during your cancer treatment. Let your doctor know right away if you think you may be pregnant. . Breast feeding warning: Women should not breast feed during treatment because  this drug could enter the breast milk and cause harm to a breast feeding baby.  Pembrolizumab Beryle Flock)  About This Drug  Pembrolizumab is used to treat cancer. It is given in the vein (IV).  Possible Side Effects  . Nausea . Diarrhea (loose bowel movements) . Constipation (not able to move bowels) . Pain in your abdomen . Tiredness . Fever . Decreased appetite (decreased hunger) . Muscle and bone pain . Trouble breathing . Cough . Rash . Itching  Note: Each of the side effects above was reported in 20% or greater of patients treated with pembrolizumab. Your side effects may be different if you are receiving pembrolizumab in combination with another chemotherapy agent. Not all possible side effects are included above.  Warnings and Precautions  . This drug works with your immune system and can cause inflammation in any of your organs and tissues and can change how they work. This may put you at risk for developing serious medical problems, which can be life-threatening. . Inflammation in the colon (colitis), which can be life-threatening - symptoms are loose bowel movements (diarrhea) stomach cramping, and sometimes blood in the bowel movements . Changes in liver function . Changes in kidney function . Inflammation (swelling) of the lungs, which can be life-threatening. You may have a dry cough or trouble breathing. . This drug may affect some of your hormone glands (especially the thyroid, adrenals, pituitary and pancreas). . Blood sugar levels may change, and you may develop diabetes. If you already have diabetes, changes may need to be made to your  diabetes medication. . Severe allergic skin reaction, which can be life-threatening. You may develop blisters on your skin that are filled with fluid or a severe red rash all over your body that may be painful. . Increased risk of organ rejection in patients who have received donor organs . Increased risk of complications  in patients who will undergo a stem cell transplant after receiving pembrolizumab. . While you are getting this drug in your vein (IV), you may have a reaction to the drug. Your nurse will check you closely for these signs: fever or shaking chills, flushing, facial swelling, feeling dizzy, headache, trouble breathing, rash, itching, chest tightness, or chest pain. These reactions may occur after your infusion. If this happens, call 911 for emergency care.  Note: Some of the side effects above are very rare. If you have concerns and/or questions, please discuss them with your medical team.  Important Information  . This drug may be present in the saliva, tears, sweat, urine, stool, vomit, semen, and vaginal secretions. Talk to your doctor and/or your nurse about the necessary precautions to take during this time.  Treating Side Effects  . Drink plenty of fluids (a minimum of eight glasses per day is recommended). . If you throw up or have loose bowel movements, you should drink more fluids so that you do not become dehydrated (lack of water in the body from losing too much fluid). . To help with nausea and vomiting, eat small, frequent meals instead of three large meals a day. Choose foods and drinks that are at room temperature. Ask your nurse or doctor about other helpful tips and medicine that is available to help stop or lessen these symptoms. . If you have diarrhea, eat low-fiber foods that are high in protein and calories and avoid foods that can irritate your digestive tracts or lead to cramping. . If you are not able to move your bowels, check with your doctor or nurse before you use any enemas, laxatives, or suppositories. . Ask your doctor or nurse about medicines that are available to help stop or lessen constipation or diarrhea. . To help with decreased appetite, eat small, frequent meals. Eat foods high in calories and protein, such as meat, poultry, fish, dry beans, tofu,  eggs, nuts, milk, yogurt, cheese, ice cream, pudding, and nutritional supplements. . Consider using sauces and spices to increase taste. Daily exercise, with your doctor's approval, may increase your appetite. . Manage tiredness by pacing your activities for the day. Be sure to include periods of rest between energy-draining activities. Marland Kitchen Keeping your pain under control is important to your wellbeing. Please tell your doctor or nurse if you are experiencing pain. . If you have diabetes, keep good control of your blood sugar level. Tell your nurse or your doctor if your glucose levels are higher or lower than normal. . If you get a rash do not put anything on it unless your doctor or nurse says you may. Keep the area around the rash clean and dry. Ask your doctor for medicine if your rash bothers you. . Moisturize your skin several times a day . Avoid sun exposure and apply sunscreen routinely when outdoors . Infusion reactions may happen after your infusion. If this happens, call 911 for emergency care.  Food and Drug Interactions  . There are no known interactions of pembrolizumab with food. . This drug may interact with other medicines. Tell your doctor and pharmacist about all the prescription and over-the-counter medicines and dietary  supplements (vitamins, minerals, herbs and others) that you are taking at this time. Also, check with your doctor or pharmacist before starting any new prescription or over-the-counter medicines, or dietary supplements to make sure that there are no interactions.  When to Call the Doctor Call your doctor or nurse if you have any of the following symptoms and/or any new or unusual symptoms: . Fever of 100.4 F (38 C) or higher . Chills . Headache that does not go away . Tiredness that interferes with your daily activities . Feeling dizzy or lightheaded . Wheezing or trouble breathing . Chest pain . Dry cough . Coughing up yellow, green or bloody  mucus . Nausea that stops you from eating or drinking, and/or that is not relieved by prescribed medicines . Lasting loss of appetite or rapid weight loss of five pounds in a week . Diarrhea, 4 times in one day or diarrhea with lack of strength or a feeling of being dizzy . Blood in your stool . No bowel movement for 3 days or when you feel uncomfortable . Extreme weakness that interferes with normal activities . Decreased urine . Abnormal blood sugar . Unusual thirst, passing urine often, headache, sweating, shakiness, irritability . A new rash and/or itching . Rash that is not relieved by prescribed medicines . Pain that does not go away or is not relieved by prescribed medicines, especially in the upper right abdomen . Flu-like symptoms: fever, headache, muscle and joint aches, and fatigue (low energy, feeling weak) . Signs of liver problems: dark urine, pale bowel movements, bad stomach pain, feeling very tired and weak, unusual itching, or yellowing of the eyes or skin . Signs of infusion reactions such as fever or shaking chills, flushing, facial swelling, feeling dizzy, headache, trouble breathing, rash, itching, chest tightness, or chest pain. If this happens, call 911 for emergency care. . If you think you are pregnant  Reproduction Warnings  . Pregnancy warning: This drug may have harmful effects on the unborn baby. Women of childbearing potential should use effective methods of birth control during your cancer treatment and for at least 4 months after treatment. Let your doctor know right away if you think you may be pregnant. . Breast feeding warning: It is not known if this drug passes into breast milk. For this reason, women should not breastfeed during treatment and for 4 months after treatment because this drug could enter the breast milk and cause harm to a breastfeeding baby. . Fertility warning: Human fertility studies have not been done with this drug. Talk with  your doctor or nurse if you plan to have children.  SELF CARE ACTIVITIES WHILE ON CHEMOTHERAPY:  Hydration Increase your fluid intake 48 hours prior to treatment and drink at least 8 to 12 cups (64 ounces) of water/decaffeinated beverages per day after treatment. You can still have your cup of coffee or soda but these beverages do not count as part of your 8 to 12 cups that you need to drink daily. No alcohol intake.  Medications Continue taking your normal prescription medication as prescribed.  If you start any new herbal or new supplements please let us know first to make sure it is safe.  Mouth Care Have teeth cleaned professionally before starting treatment. Keep dentures and partial plates clean. Use soft toothbrush and do not use mouthwashes that contain alcohol. Biotene is a good mouthwash that is available at most pharmacies or may be ordered by calling [phone]. Use warm salt water gargles (  1 teaspoon salt per 1 quart warm water) before and after meals and at bedtime. Or you may rinse with 2 tablespoons of three-percent hydrogen peroxide mixed in eight ounces of water. If you are still having problems with your mouth or sores in your mouth please call the clinic. If you need dental work, please let the doctor know before you go for your appointment so that we can coordinate the best possible time for you in regards to your chemo regimen. You need to also let your dentist know that you are actively taking chemo. We may need to do labs prior to your dental appointment.  Skin Care Always use sunscreen that has not expired and with SPF (Sun Protection Factor) of 50 or higher. Wear hats to protect your head from the sun. Remember to use sunscreen on your hands, ears, face, & feet.  Use good moisturizing lotions such as udder cream, eucerin, or even Vaseline. Some chemotherapies can cause dry skin, color changes in your skin and nails.    . Avoid long, hot showers or baths. . Use gentle,  fragrance-free soaps and laundry detergent. . Use moisturizers, preferably creams or ointments rather than lotions because the thicker consistency is better at preventing skin dehydration. Apply the cream or ointment within 15 minutes of showering. Reapply moisturizer at night, and moisturize your hands every time after you wash them.  Hair Loss (if your doctor says your hair will fall out)  . If your doctor says that your hair is likely to fall out, decide before you begin chemo whether you want to wear a wig. You may want to shop before treatment to match your hair color. . Hats, turbans, and scarves can also camouflage hair loss, although some people prefer to leave their heads uncovered. If you go bare-headed outdoors, be sure to use sunscreen on your scalp. . Cut your hair short. It eases the inconvenience of shedding lots of hair, but it also can reduce the emotional impact of watching your hair fall out. . Don't perm or color your hair during chemotherapy. Those chemical treatments are already damaging to hair and can enhance hair loss. Once your chemo treatments are done and your hair has grown back, it's OK to resume dyeing or perming hair. With chemotherapy, hair loss is almost always temporary. But when it grows back, it may be a different color or texture. In older adults who still had hair color before chemotherapy, the new growth may be completely gray.  Often, new hair is very fine and soft.  Infection Prevention Please wash your hands for at least 30 seconds using warm soapy water. Handwashing is the #1 way to prevent the spread of germs. Stay away from sick people or people who are getting over a cold. If you develop respiratory systems such as green/yellow mucus production or productive cough or persistent cough let us know and we will see if you need an antibiotic. It is a good idea to keep a pair of gloves on when going into grocery stores/[employer] to decrease your risk of coming into  contact with germs on the carts, etc. Carry alcohol hand gel with you at all times and use it frequently if out in public. If your temperature reaches 100.5 or higher please call the clinic and let us know.  If it is after hours or on the weekend please go to the ER if your temperature is over 100.5.  Please have your own personal thermometer at home to use.  Sex and bodily fluids If you are going to have sex, a condom must be used to protect the person that isn't taking chemotherapy. Chemo can decrease your libido (sex drive). For a few days after chemotherapy, chemotherapy can be excreted through your bodily fluids.  When using the toilet please close the lid and flush the toilet twice.  Do this for a few day after you have had chemotherapy.   Effects of chemotherapy on your sex life Some changes are simple and won't last long. They won't affect your sex life permanently. Sometimes you may feel: . too tired . not strong enough to be very active . sick or sore  . not in the mood . anxious or low Your anxiety might not seem related to sex. For example, you may be worried about the cancer and how your treatment is going. Or you may be worried about money, or about how you family are coping with your illness. These things can cause stress, which can affect your interest in sex. It's important to talk to your partner about how you feel. Remember - the changes to your sex life don't usually last long. There's usually no medical reason to stop having sex during chemo. The drugs won't have any long term physical effects on your performance or enjoyment of sex. Cancer can't be passed on to your partner during sex  Contraception It's important to use reliable contraception during treatment. Avoid getting pregnant while you or your partner are having chemotherapy. This is because the drugs may harm the baby. Sometimes chemotherapy drugs can leave a man or woman infertile.  This means you would not be able  to have children in the future. You might want to talk to someone about permanent infertility. It can be very difficult to learn that you may no longer be able to have children. Some people find counselling helpful. There might be ways to preserve your fertility, although this is easier for men than for women. You may want to speak to a fertility expert. You can talk about sperm banking or harvesting your eggs. You can also ask about other fertility options, such as donor eggs. If you have or have had breast cancer, your doctor might advise you not to take the contraceptive pill. This is because the hormones in it might affect the cancer.  It is not known for sure whether or not chemotherapy drugs can be passed on through semen or secretions from the vagina. Because of this some doctors advise people to use a barrier method if you have sex during treatment. This applies to vaginal, anal or oral sex. Generally, doctors advise a barrier method only for the time you are actually having the treatment and for about a week after your treatment. Advice like this can be worrying, but this does not mean that you have to avoid being intimate with your partner. You can still have close contact with your partner and continue to enjoy sex.  Animals If you have cats or birds we just ask that you not change the litter or change the cage.  Please have someone else do this for you while you are on chemotherapy.   Food Safety During and After Cancer Treatment Food safety is important for people both during and after cancer treatment. Cancer and cancer treatments, such as chemotherapy, radiation therapy, and stem cell/bone marrow transplantation, often weaken the immune system. This makes it harder for your body to protect itself from foodborne illness, also called food  poisoning. Foodborne illness is caused by eating food that contains harmful bacteria, parasites, or viruses.  Foods to avoid Some foods have a higher  risk of becoming tainted with bacteria. These include: Marland Kitchen Unwashed fresh fruit and vegetables, especially leafy vegetables that can hide dirt and other contaminants . Raw sprouts, such as alfalfa sprouts . Raw or undercooked beef, especially ground beef, or other raw or undercooked meat and poultry . Fatty, fried, or spicy foods immediately before or after treatment.  These can sit heavy on your stomach and make you feel nauseous. . Raw or undercooked shellfish, such as oysters. . Sushi and sashimi, which often contain raw fish.  . Unpasteurized beverages, such as unpasteurized fruit juices, raw milk, raw yogurt, or cider . Undercooked eggs, such as soft boiled, over easy, and poached; raw, unpasteurized eggs; or foods made with raw egg, such as homemade raw cookie dough and homemade mayonnaise Simple steps for food safety Shop smart. . Do not buy food stored or displayed in an unclean area. . Do not buy bruised or damaged fruits or vegetables. . Do not buy cans that have cracks, dents, or bulges. . Pick up foods that can spoil at the end of your shopping trip and store them in a cooler on the way home. Prepare and clean up foods carefully. . Rinse all fresh fruits and vegetables under running water, and dry them with a clean towel or paper towel. . Clean the top of cans before opening them. . After preparing food, wash your hands for 20 seconds with hot water and soap. Pay special attention to areas between fingers and under nails. . Clean your utensils and dishes with hot water and soap. Marland Kitchen Disinfect your kitchen and cutting boards using 1 teaspoon of liquid, unscented bleach mixed into 1 quart of water.   Dispose of old food. . Eat canned and packaged food before its expiration date (the "use by" or "best before" date). . Consume refrigerated leftovers within 3 to 4 days. After that time, throw out the food. Even if the food does not smell or look spoiled, it still may be unsafe. Some  bacteria, such as Listeria, can grow even on foods stored in the refrigerator if they are kept for too long. Take precautions when eating out. . At restaurants, avoid buffets and salad bars where food sits out for a long time and comes in contact with many people. Food can become contaminated when someone with a virus, often a norovirus, or another "bug" handles it. . Put any leftover food in a "to-go" container yourself, rather than having the server do it. And, refrigerate leftovers as soon as you get home. . Choose restaurants that are clean and that are willing to prepare your food as you order it cooked.   MEDICATIONS:  Compazine/Prochlorperazine 10mg  tablet. Take 1 tablet every 6 hours as needed for nausea/vomiting. (This can make you sleepy)   EMLA cream. Apply a quarter size amount to port site 1 hour prior to chemo. Do not rub in. Cover with plastic wrap.   Over-the-Counter Meds:  Colace - 100 mg capsules - take 2 capsules daily.  If this doesn't help then you can increase to 2 capsules twice daily.  Call us if this does not help your bowels move.   Imodium 2mg  capsule. Take 2 capsules after the 1st loose stool and then 1 capsule every 2 hours until you go a total of 12 hours without having a loose stool. Call the [facility] if loose stools continue. If diarrhea occurs at bedtime, take 2 capsules at bedtime. Then take 2 capsules every 4 hours until morning. Call [facility].    Diarrhea Sheet   If you are having loose stools/diarrhea, please purchase Imodium and begin taking as outlined:  At the first sign of poorly formed or loose stools you should begin taking Imodium (loperamide) 2 mg capsules.  Take two caplets (4mg ) followed by one caplet (2mg ) every 2 hours until you have had no diarrhea for 12 hours.  During the  night take two caplets (4mg ) at bedtime and continue every 4 hours during the night until the morning.  Stop taking Imodium only after there is no sign of diarrhea for 12 hours.    Always call the [facility] if you are having loose stools/diarrhea that you can't get under control.  Loose stools/diarrhea leads to dehydration (loss of water) in your body.  We have other options of trying to get the loose stools/diarrhea to stop but you must let us know!   Constipation Sheet  Colace - 100 mg capsules - take 2 capsules daily.  If this doesn't help then you can increase to 2 capsules twice daily.  Please call if the above does not work for you.   Do not go more than 2 days without a bowel movement.  It is very important that you do not become constipated.  It will make you feel sick to your stomach (nausea) and can cause abdominal pain and vomiting.   Nausea Sheet   Compazine/Prochlorperazine 10mg  tablet. Take 1 tablet every 6 hours as needed for nausea/vomiting. (This can make you sleepy)  If you are having persistent nausea (nausea that does not stop) please call the [facility] and let us know the amount of nausea that you are experiencing.  If you begin to vomit, you need to call the [facility] and if it is the weekend and you have vomited more than one time and can't get it to stop-go to the Emergency Room.  Persistent nausea/vomiting can lead to dehydration (loss of fluid in your body) and will make you feel terrible.   Ice chips, sips of clear liquids, foods that are @ room temperature, crackers, and toast tend to be better tolerated.   SYMPTOMS TO REPORT AS SOON AS POSSIBLE AFTER TREATMENT:   FEVER GREATER THAN 100.5 F  CHILLS WITH OR WITHOUT FEVER  NAUSEA AND VOMITING THAT IS NOT CONTROLLED WITH YOUR NAUSEA MEDICATION  UNUSUAL SHORTNESS OF BREATH  UNUSUAL BRUISING OR BLEEDING  TENDERNESS IN MOUTH AND THROAT WITH OR WITHOUT PRESENCE OF ULCERS  URINARY  PROBLEMS  BOWEL PROBLEMS  UNUSUAL RASH      Wear comfortable clothing and clothing appropriate for easy access to any Portacath or PICC line. Let  us know if there is anything that we can do to make your therapy better!    What to do if you need assistance after hours or on the weekends: CALL [phone].  HOLD on the line, do not hang up.  You will hear multiple messages but at the end you will be connected with a nurse triage line.  They will contact the doctor if necessary.  Most of the time they will be able to assist you.  Do not call the hospital operator.      I have been informed and understand all of the instructions given to me and have received a copy. I have been instructed to call the clinic [phone] or my family physician as soon as possible for continued medical care, if indicated. I do not have any more questions at this time but understand that I may call the [facility] or the Patient Navigator at [phone] during office hours should I have questions or need assistance in obtaining follow-up care.

## 2018-05-02 ENCOUNTER — Inpatient Hospital Stay (HOSPITAL_COMMUNITY): Payer: Medicare Other

## 2018-05-02 DIAGNOSIS — Z5112 Encounter for antineoplastic immunotherapy: Secondary | ICD-10-CM | POA: Diagnosis not present

## 2018-05-02 DIAGNOSIS — C109 Malignant neoplasm of oropharynx, unspecified: Secondary | ICD-10-CM

## 2018-05-02 LAB — LACTATE DEHYDROGENASE: LDH: 135 U/L (ref 98–192)

## 2018-05-02 LAB — COMPREHENSIVE METABOLIC PANEL
ALT: 17 U/L (ref 0–44)
AST: 18 U/L (ref 15–41)
Albumin: 4 g/dL (ref 3.5–5.0)
Alkaline Phosphatase: 53 U/L (ref 38–126)
Anion gap: 9 (ref 5–15)
BUN: 22 mg/dL (ref 8–23)
CALCIUM: 9.4 mg/dL (ref 8.9–10.3)
CO2: 27 mmol/L (ref 22–32)
Chloride: 104 mmol/L (ref 98–111)
Creatinine, Ser: 1.79 mg/dL — ABNORMAL HIGH (ref 0.61–1.24)
GFR calc Af Amer: 44 mL/min — ABNORMAL LOW (ref >60)
GFR calc non Af Amer: 38 mL/min — ABNORMAL LOW (ref >60)
Glucose, Bld: 88 mg/dL (ref 70–99)
Potassium: 4.5 mmol/L (ref 3.5–5.1)
Sodium: 140 mmol/L (ref 135–145)
Total Bilirubin: 0.5 mg/dL (ref 0.3–1.2)
Total Protein: 7.2 g/dL (ref 6.5–8.1)

## 2018-05-02 LAB — CBC WITH DIFFERENTIAL/PLATELET
Abs Immature Granulocytes: 0.01 10*3/uL (ref 0.00–0.07)
Basophils Absolute: 0 10*3/uL (ref 0.0–0.1)
Basophils Relative: 1 %
EOS PCT: 5 %
Eosinophils Absolute: 0.2 10*3/uL (ref 0.0–0.5)
HCT: 42.5 % (ref 39.0–52.0)
Hemoglobin: 13.3 g/dL (ref 13.0–17.0)
Immature Granulocytes: 0 %
Lymphocytes Relative: 27 %
Lymphs Abs: 1.1 10*3/uL (ref 0.7–4.0)
MCH: 29.6 pg (ref 26.0–34.0)
MCHC: 31.3 g/dL (ref 30.0–36.0)
MCV: 94.4 fL (ref 80.0–100.0)
MONO ABS: 0.6 10*3/uL (ref 0.1–1.0)
Monocytes Relative: 14 %
Neutro Abs: 2.2 10*3/uL (ref 1.7–7.7)
Neutrophils Relative %: 53 %
Platelets: 204 10*3/uL (ref 150–400)
RBC: 4.5 MIL/uL (ref 4.22–5.81)
RDW: 14.8 % (ref 11.5–15.5)
WBC: 4.2 10*3/uL (ref 4.0–10.5)
nRBC: 0 % (ref 0.0–0.2)

## 2018-05-03 ENCOUNTER — Inpatient Hospital Stay (HOSPITAL_COMMUNITY): Payer: Medicare Other

## 2018-05-03 ENCOUNTER — Encounter (HOSPITAL_COMMUNITY): Payer: Self-pay

## 2018-05-03 VITALS — BP 132/70 | HR 58 | Temp 97.5°F | Resp 18 | Wt 152.8 lb

## 2018-05-03 DIAGNOSIS — Z5112 Encounter for antineoplastic immunotherapy: Secondary | ICD-10-CM | POA: Diagnosis not present

## 2018-05-03 DIAGNOSIS — C109 Malignant neoplasm of oropharynx, unspecified: Secondary | ICD-10-CM

## 2018-05-03 MED ORDER — SODIUM CHLORIDE 0.9 % IV SOLN
381.0000 mg | Freq: Once | INTRAVENOUS | Status: AC
  Administered 2018-05-03: 380 mg via INTRAVENOUS
  Filled 2018-05-03: qty 38

## 2018-05-03 MED ORDER — SODIUM CHLORIDE 0.9 % IV SOLN
175.0000 mg/m2 | Freq: Once | INTRAVENOUS | Status: AC
  Administered 2018-05-03: 330 mg via INTRAVENOUS
  Filled 2018-05-03: qty 55

## 2018-05-03 MED ORDER — HEPARIN SOD (PORK) LOCK FLUSH 100 UNIT/ML IV SOLN
500.0000 [IU] | Freq: Once | INTRAVENOUS | Status: AC | PRN
  Administered 2018-05-03: 500 [IU]

## 2018-05-03 MED ORDER — FAMOTIDINE IN NACL 20-0.9 MG/50ML-% IV SOLN
20.0000 mg | Freq: Once | INTRAVENOUS | Status: AC
  Administered 2018-05-03: 20 mg via INTRAVENOUS
  Filled 2018-05-03: qty 50

## 2018-05-03 MED ORDER — DIPHENHYDRAMINE HCL 50 MG/ML IJ SOLN
50.0000 mg | Freq: Once | INTRAMUSCULAR | Status: AC
  Administered 2018-05-03: 50 mg via INTRAVENOUS
  Filled 2018-05-03: qty 1

## 2018-05-03 MED ORDER — SODIUM CHLORIDE 0.9 % IV SOLN
20.0000 mg | Freq: Once | INTRAVENOUS | Status: AC
  Administered 2018-05-03: 20 mg via INTRAVENOUS
  Filled 2018-05-03: qty 20

## 2018-05-03 MED ORDER — PALONOSETRON HCL INJECTION 0.25 MG/5ML
0.2500 mg | Freq: Once | INTRAVENOUS | Status: AC
  Administered 2018-05-03: 0.25 mg via INTRAVENOUS
  Filled 2018-05-03: qty 5

## 2018-05-03 MED ORDER — SODIUM CHLORIDE 0.9% FLUSH
10.0000 mL | INTRAVENOUS | Status: DC | PRN
  Administered 2018-05-03: 10 mL
  Filled 2018-05-03: qty 10

## 2018-05-03 MED ORDER — SODIUM CHLORIDE 0.9 % IV SOLN
Freq: Once | INTRAVENOUS | Status: AC
  Administered 2018-05-03: 09:00:00 via INTRAVENOUS

## 2018-05-03 NOTE — Patient Instructions (Signed)
[facility] Discharge Instructions for Patients Receiving Chemotherapy   Beginning January 23rd 2017 lab work for the Cancer Center will be done in the  Main lab at [facility] on 1st floor. If you have a lab appointment with the Cancer Center please come in thru the  Main Entrance and check in at the main information desk   Today you received the following chemotherapy agents Taxol and Carboplatin. Follow-up as scheduled. Call clinic for any questions or concerns  To help prevent nausea and vomiting after your treatment, we encourage you to take your nausea medication.   If you develop nausea and vomiting, or diarrhea that is not controlled by your medication, call the clinic.  The clinic phone number is [phone]. Office hours are Monday-Friday 8:30am-5:00pm.  BELOW ARE SYMPTOMS THAT SHOULD BE REPORTED IMMEDIATELY:  *FEVER GREATER THAN 101.0 F  *CHILLS WITH OR WITHOUT FEVER  NAUSEA AND VOMITING THAT IS NOT CONTROLLED WITH YOUR NAUSEA MEDICATION  *UNUSUAL SHORTNESS OF BREATH  *UNUSUAL BRUISING OR BLEEDING  TENDERNESS IN MOUTH AND THROAT WITH OR WITHOUT PRESENCE OF ULCERS  *URINARY PROBLEMS  *BOWEL PROBLEMS  UNUSUAL RASH Items with * indicate a potential emergency and should be followed up as soon as possible. If you have an emergency after office hours please contact your primary care physician or go to the nearest emergency department.  Please call the clinic during office hours if you have any questions or concerns.   You may also contact the Patient Navigator at [phone] should you have any questions or need assistance in obtaining follow up care.      Resources For Cancer Patients and their Caregivers ? American Cancer Society: Can assist with transportation, wigs, general needs, runs Look Good Feel Better.        1-888-227-6333 ? Cancer Care: Provides financial assistance, online support groups, medication/co-pay assistance.   1-800-813-HOPE (4673) ? [facility] Assists [place] cancer patients and their families through emotional , educational and financial support.  336-427-4357 ? [place] DSS Where to apply for food stamps, Medicaid and utility assistance. 336-342-1394 ? [facility]: Transportation to medical appointments. 336-347-2287 ? Social Security Administration: May apply for disability if have a Stage IV cancer. 336-342-7796 1-800-772-1213 ? [place] Aging, Disability and Transit Services: Assists with nutrition, care and transit needs. 336-349-2343         

## 2018-05-03 NOTE — Progress Notes (Signed)
4446 Labs reviewed with Dr. Delton Coombes and pt approved for chemo tx today per MD. Chemotherapy education packet with drug-specific information given to pt and reviewed in detail yesterday by A.Anderson RN with all questions answered and consent obtained    Alexander [name] tolerated chemo tx well without complaints or incident. VSS upon discharge Pt instructed to call for any problems or questions. Pt discharged self ambulatory in satisfactory condition

## 2018-05-06 ENCOUNTER — Telehealth (HOSPITAL_COMMUNITY): Payer: Self-pay

## 2018-05-06 NOTE — Telephone Encounter (Signed)
24 hr Chemo F/U call- no answer: left message for pt to call us for any issues or questions

## 2018-05-09 ENCOUNTER — Other Ambulatory Visit (HOSPITAL_COMMUNITY): Payer: Self-pay | Admitting: *Deleted

## 2018-05-09 DIAGNOSIS — C01 Malignant neoplasm of base of tongue: Secondary | ICD-10-CM

## 2018-05-09 NOTE — Progress Notes (Signed)
[place], RN from [facility] called and asked that we add a PSA to patient's next lab draw for Dr. Harlen Labs.  I have added to his appt in 2 weeks.  T[place]ania also stated that patient is going to return to their clinic in 6 months unless we need to send him back for anything further.

## 2018-05-10 ENCOUNTER — Encounter (HOSPITAL_COMMUNITY): Payer: Self-pay | Admitting: Thoracic Surgery (Cardiothoracic Vascular Surgery)

## 2018-05-14 ENCOUNTER — Encounter: Payer: Self-pay | Admitting: General Practice

## 2018-05-14 ENCOUNTER — Telehealth: Payer: Self-pay | Admitting: General Practice

## 2018-05-14 ENCOUNTER — Ambulatory Visit (INDEPENDENT_AMBULATORY_CARE_PROVIDER_SITE_OTHER): Payer: Medicare Other | Admitting: Nurse Practitioner

## 2018-05-14 ENCOUNTER — Encounter: Payer: Self-pay | Admitting: Nurse Practitioner

## 2018-05-14 VITALS — BP 118/71 | HR 78 | Temp 96.5°F | Ht 73.0 in | Wt 153.8 lb

## 2018-05-14 DIAGNOSIS — K2970 Gastritis, unspecified, without bleeding: Secondary | ICD-10-CM

## 2018-05-14 DIAGNOSIS — R1310 Dysphagia, unspecified: Secondary | ICD-10-CM | POA: Diagnosis not present

## 2018-05-14 DIAGNOSIS — K2990 Gastroduodenitis, unspecified, without bleeding: Secondary | ICD-10-CM

## 2018-05-14 DIAGNOSIS — R1319 Other dysphagia: Secondary | ICD-10-CM

## 2018-05-14 NOTE — Progress Notes (Signed)
CC'D TO PCP °

## 2018-05-14 NOTE — Telephone Encounter (Signed)
Forestine Na CSW Progress Notes  Call from patient, CSW returned call and spoke w patient.  States "I just wanted to let you know that everything is fine, I just left my gastrologist and dont have to go back for another year."  CSW has also received request from Dr Walden Field to check in w patient re noncompliance - staff message sent to MD to clarify what patient needs to do and will call after receving response.  Edwyna Shell, LCSW Clinical Social Worker Phone:  [phone]

## 2018-05-14 NOTE — Assessment & Plan Note (Signed)
No further GERD, gastritis, gastroduodenitis symptoms.  Recommend he continue PPI.  Follow-up in 1 year to allow focus on cancer treatment.

## 2018-05-14 NOTE — Progress Notes (Signed)
Referring Provider: Manon Hilding, MD Primary Care Physician:  Lemmie Evens, MD Primary GI:  Dr. Oneida Alar   Chief Complaint  Patient presents with  . Dysphagia    doing ok    HPI:   Alexander [name] is a 68 y.o. male who presents for follow-up on dysphasia.  The patient was last seen in our office 11/09/2017 for the same as well as history of oropharyngeal cancer.  At that time noted colonoscopy up-to-date.  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  Cancer was stage IVa and status post curative concomitant chemo and radiation with remission noted on PET scan in October 2016.  At his last visit PET scan had not been completed but CT of the chest and abdomen/pelvis found interval development of bulky mediastinal adenopathy concerning for metastatic disease, interval development of pulmonary nodules within the left lower lobe concerning for possible pulmonary metastatic disease.  At his last visit he noted history of chronic dysphagia.  Noted worsening when pain medications were discontinued about 3 months prior and now with solid food dysphagia with every meal and pill dysphagia with every medication, worse with larger pills.  Food and pills eventually pass.  Has GERD symptoms if he eats trigger foods.  Noted 5 pound unintentional weight loss in the previous 3 months.  No other GI complaints.  Recommended EGD for evaluation.  EGD was completed 01/15/2018 which found LA grade B reflux esophagitis status post dilation, small hiatal hernia, moderate gastritis/mild duodenitis.  He was status post dilation at that time.  Gastric biopsies showed gastritis.  Recommended start omeprazole 30 minutes daily.  Follow soft diet as needed including chopped meats.  He began seeing oncology again.  Apparently compliance was an issue previously and at that time as well.  Patient did eventually have PET scan completed  02/18/2018 which found hypermetabolic mediastinal and left hilar lymphadenopathy associated with small hypermetabolic left lung nodules.  Metastatic disease a distant consideration.  Persistent hypermetabolic activity in the right prostate gland.  He was referred to CT surgery for evaluation for tissue diagnosis of lung findings.  It appears he underwent video bronchoscopy with ultrasound in January of this year.  He has also since had a Port-A-Cath placed.  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  Indications include cancer likely originated from original head and neck cancer history.  MRI of the brain was completed 04/25/2018 and negative for metastatic disease.  It appears chemotherapy has begun as of 05/02/2018.  Today he states he's doing ok overall. No further dysphagia symptoms. Chemo is going well, no major side effects or problems. Denies abdominal pain, N/V, fever, chills, unintentional weight loss, GERD symptoms. Denies chest pain, dyspnea, dizziness, lightheadedness, syncope, near syncope. Denies any other upper or lower GI symptoms.  Past Medical History:  Diagnosis Date  . GERD (gastroesophageal reflux disease)   . 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 Oropharyngeal cancer ([place]) 07/28/2014   dx. 3 weeks ago.- Dr. [facility] [place], [place].  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 BIOPSY  01/15/2018   Procedure: BIOPSY;  Surgeon: Danie Binder, MD;  [facility];  Service: Endoscopy;;  gastric  .  COLONOSCOPY N/A 03/13/2016   Procedure: COLONOSCOPY;  Surgeon: Danie Binder, MD;  [facility];  Service: Endoscopy;  Laterality: N/A;  2:15 PM  . ESOPHAGOGASTRODUODENOSCOPY (EGD) WITH PROPOFOL N/A 08/17/2014   Procedure:  ESOPHAGOGASTRODUODENOSCOPY (EGD) WITH PROPOFOL (procedure #1);  Surgeon: Aviva Signs Md, MD;  Location: [facility];  Service: General;  Laterality: N/A;  . ESOPHAGOGASTRODUODENOSCOPY (EGD) WITH PROPOFOL N/A 01/15/2018   Procedure: ESOPHAGOGASTRODUODENOSCOPY (EGD) WITH PROPOFOL;  Surgeon: Danie Binder, MD;  [facility];  Service: Endoscopy;  Laterality: N/A;  9:30am  . MULTIPLE EXTRACTIONS WITH ALVEOLOPLASTY N/A 08/12/2014   Procedure: Extraction of tooth #'s 6,17,22,23,24,25,26,27 with alveoloplasty;  Surgeon: Lenn Cal, DDS;  Location: [facility];  Service: Oral Surgery;  Laterality: N/A;  . PANENDOSCOPY N/A 08/06/2014   Procedure: PANENDOSCOPY WITH BIOPSY;  Surgeon: Leta Baptist, MD;  Location: [facility];  Service: ENT;  Laterality: N/A;  . PEG PLACEMENT Left 08/17/14  . PEG PLACEMENT N/A 08/17/2014   Procedure: PERCUTANEOUS ENDOSCOPIC GASTROSTOMY (PEG) PLACEMENT (procedure #1);  Surgeon: Aviva Signs Md, MD;  Location: [facility];  Service: General;  Laterality: N/A;  . PORT-A-CATH REMOVAL Right 07/17/2016   Procedure: MINOR REMOVAL PORT-A-CATH;  Surgeon: Aviva Signs, MD;  Location: [facility];  Service: General;  Laterality: Right;  . PORTACATH PLACEMENT Right 08/17/14  . PORTACATH PLACEMENT Right 08/17/2014   Procedure: INSERTION PORT-A-CATH (procedure #2);  Surgeon: Aviva Signs Md, MD;  Location: [facility];  Service: General;  Laterality: Right;  . PORTACATH PLACEMENT Left 04/26/2018   Procedure: INSERTION PORT-A-CATH (attached catheter in left subclavian);  Surgeon: Aviva Signs, MD;  Location: [facility];  Service: General;  Laterality: Left;  . SAVORY DILATION N/A 01/15/2018   Procedure: SAVORY DILATION;  Surgeon: Danie Binder, MD;  [facility];  Service: Endoscopy;  Laterality: N/A;  . VIDEO BRONCHOSCOPY WITH ENDOBRONCHIAL ULTRASOUND N/A 04/15/2018   Procedure: VIDEO BRONCHOSCOPY WITH ENDOBRONCHIAL ULTRASOUND;  Surgeon: Melrose Nakayama, MD;  Location[facility];   Service: Thoracic;  Laterality: N/A;    Current Outpatient Medications  Medication Sig Dispense Refill  . CARBOPLATIN IV Inject into the vein every 21 ( twenty-one) days.    Marland Kitchen dexamethasone (DECADRON) 4 MG tablet Take 2 tablets (8 mg total) by mouth daily. Start the day after chemotherapy for 2 days. 30 tablet 1  . feeding supplement, ENSURE ENLIVE, (ENSURE ENLIVE) LIQD Take 237 mLs by mouth 2 (two) times daily.    Marland Kitchen levothyroxine (SYNTHROID) 25 MCG tablet Take 1 tablet (25 mcg total) by mouth daily before breakfast. 30 tablet 1  . lidocaine-prilocaine (EMLA) cream Apply to affected area once (Patient taking differently: Apply to port a cath site and cover with plastic wrap one hour prior to appointment) 30 g 3  . LORazepam (ATIVAN) 0.5 MG tablet Take 1 tablet (0.5 mg total) by mouth every 6 (six) hours as needed (Nausea or vomiting). 30 tablet 0  . omeprazole (PRILOSEC) 20 MG capsule 1 PO 30 MINS PRIOR TO BREAKFAST. (Patient taking differently: Take 20 mg by mouth daily before breakfast. 1 PO 30 MINS PRIOR TO BREAKFAST.) 90 capsule 3  . ondansetron (ZOFRAN) 8 MG tablet Take 1 tablet (8 mg total) by mouth 2 (two) times daily as needed for refractory nausea / vomiting. Start on day 3 after chemo. 30 tablet 1  . PACLitaxel (TAXOL IV) Inject into the vein every 21 ( twenty-one) days.    . Pembrolizumab (KEYTRUDA IV) Inject into the vein every  21 ( twenty-one) days.    . prochlorperazine (COMPAZINE) 10 MG tablet Take 1 tablet (10 mg total) by mouth every 6 (six) hours as needed (Nausea or vomiting). 30 tablet 1   No current facility-administered medications for this visit.     Allergies as of 05/14/2018  . (No Known Allergies)    Family History  Problem Relation Age of Onset  . Colon cancer Neg Hx   . Gastric cancer Neg Hx   . Esophageal cancer Neg Hx     Social History   Socioeconomic History  . Marital status: Legally Separated    Spouse name: Not on file  . Number of children: 5    . Years of education: Not on file  . Highest education level: Not on file  Occupational History  . Not on file  Social Needs  . Financial resource strain: Not on file  . Food insecurity:    Worry: Not on file    Inability: Not on file  . Transportation needs:    Medical: Not on file    Non-medical: Not on file  Tobacco Use  . Smoking status: Former Smoker    Packs/day: 0.50    Years: 30.00    Pack years: 15.00    Last attempt to quit: 07/22/2014    Years since quitting: 3.8  . Smokeless tobacco: Never Used  Substance and Sexual Activity  . Alcohol use: Not Currently    Alcohol/week: 0.0 standard drinks    Comment: None currently (11/09/17); previously 1-2 beers on the weekend  . Drug use: No  . Sexual activity: Not on file  Lifestyle  . Physical activity:    Days per week: Not on file    Minutes per session: Not on file  . Stress: Not on file  Relationships  . Social connections:    Talks on phone: Not on file    Gets together: Not on file    Attends religious service: Not on file    Active member of club or organization: Not on file    Attends meetings of clubs or organizations: Not on file    Relationship status: Not on file  Other Topics Concern  . Not on file  Social History Narrative  . Not on file    Review of Systems: Complete ROS negative except as per HPI.   Physical Exam: BP 118/71   Pulse 78   Temp (!) 96.5 F (35.8 C) (Oral)   Ht 6\' 1"  (1.854 m)   Wt 153 lb 12.8 oz (69.8 kg)   BMI 20.29 kg/m  General:   Alert and oriented. Pleasant and cooperative. Well-nourished and well-developed.  Eyes:  Without icterus, sclera clear and conjunctiva pink.  Ears:  Normal auditory acuity. Cardiovascular:  S1, S2 present without murmurs appreciated. Extremities without clubbing or edema. Respiratory:  Clear to auscultation bilaterally. No wheezes, rales, or rhonchi. No distress.  Gastrointestinal:  +BS, soft, non-tender and non-distended. No HSM noted. No  guarding or rebound. No masses appreciated.  Rectal:  Deferred  Musculoskalatal:  Symmetrical without gross deformities. Neurologic:  Alert and oriented x4;  grossly normal neurologically. Psych:  Alert and cooperative. Normal mood and affect. Heme/Lymph/Immune: No excessive bruising noted.    05/14/2018 10:13 AM   Disclaimer: This note was dictated with voice recognition software. Similar sounding words can inadvertently be transcribed and may not be corrected upon review.

## 2018-05-14 NOTE — Assessment & Plan Note (Signed)
No further dysphagia symptoms.  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  Given no further symptoms we will have him follow-up in 1 year.  He can call us for any worsening or recurrent symptoms before then.

## 2018-05-14 NOTE — Patient Instructions (Signed)
Your health issues we discussed today were:   GERD (heartburn): 1. Continue your acid blocker 2. Call us if you have any problems with worsening symptoms  Dysphagia (swallowing problems): 1. I am glad your swallowing difficulties have resolved 2. Call us if they come back or get worse  Overall I recommend:  1. Return for follow-up in 1 year 2. Call us if you have any questions or concerns 3. Best of luck with cancer treatment!!!  At [facility] we value your feedback. You may receive a survey about your visit today. Please share your experience as we strive to create trusting relationships with our patients to provide genuine, compassionate, quality care.  We appreciate your understanding and patience as we review any laboratory studies, imaging, and other diagnostic tests that are ordered as we care for you. Our office policy is 5 business days for review of these results, and any emergent or urgent results are addressed in a timely manner for your best interest. If you do not hear from our office in 1 week, please contact us.   We also encourage the use of MyChart, which contains your medical information for your review as well. If you are not enrolled in this feature, an access code is on this after visit summary for your convenience. Thank you for allowing [place] to be involved in your care.  It was great to see you today!  I hope you have a great day!!

## 2018-05-16 ENCOUNTER — Encounter (HOSPITAL_COMMUNITY): Payer: Self-pay | Admitting: Thoracic Surgery (Cardiothoracic Vascular Surgery)

## 2018-05-24 ENCOUNTER — Inpatient Hospital Stay (HOSPITAL_COMMUNITY): Payer: Medicare Other

## 2018-05-24 ENCOUNTER — Encounter (HOSPITAL_COMMUNITY): Payer: Self-pay

## 2018-05-24 ENCOUNTER — Inpatient Hospital Stay (HOSPITAL_COMMUNITY): Payer: Medicare Other | Attending: Internal Medicine | Admitting: Internal Medicine

## 2018-05-24 ENCOUNTER — Other Ambulatory Visit: Payer: Self-pay

## 2018-05-24 ENCOUNTER — Encounter (HOSPITAL_COMMUNITY): Payer: Self-pay | Admitting: Internal Medicine

## 2018-05-24 VITALS — BP 134/78 | HR 66 | Temp 97.8°F | Resp 18

## 2018-05-24 VITALS — BP 122/75 | HR 66 | Temp 97.9°F | Resp 18 | Wt 153.1 lb

## 2018-05-24 DIAGNOSIS — C109 Malignant neoplasm of oropharynx, unspecified: Secondary | ICD-10-CM | POA: Diagnosis not present

## 2018-05-24 DIAGNOSIS — E039 Hypothyroidism, unspecified: Secondary | ICD-10-CM | POA: Diagnosis not present

## 2018-05-24 DIAGNOSIS — R590 Localized enlarged lymph nodes: Secondary | ICD-10-CM | POA: Insufficient documentation

## 2018-05-24 DIAGNOSIS — R918 Other nonspecific abnormal finding of lung field: Secondary | ICD-10-CM | POA: Diagnosis not present

## 2018-05-24 DIAGNOSIS — R1310 Dysphagia, unspecified: Secondary | ICD-10-CM

## 2018-05-24 DIAGNOSIS — R972 Elevated prostate specific antigen [PSA]: Secondary | ICD-10-CM

## 2018-05-24 DIAGNOSIS — Z79899 Other long term (current) drug therapy: Secondary | ICD-10-CM | POA: Diagnosis not present

## 2018-05-24 DIAGNOSIS — Z5112 Encounter for antineoplastic immunotherapy: Secondary | ICD-10-CM | POA: Diagnosis not present

## 2018-05-24 DIAGNOSIS — Z87891 Personal history of nicotine dependence: Secondary | ICD-10-CM | POA: Diagnosis not present

## 2018-05-24 DIAGNOSIS — C01 Malignant neoplasm of base of tongue: Secondary | ICD-10-CM

## 2018-05-24 DIAGNOSIS — E038 Other specified hypothyroidism: Secondary | ICD-10-CM | POA: Diagnosis not present

## 2018-05-24 DIAGNOSIS — K7689 Other specified diseases of liver: Secondary | ICD-10-CM | POA: Diagnosis not present

## 2018-05-24 LAB — COMPREHENSIVE METABOLIC PANEL
ALT: 18 U/L (ref 0–44)
AST: 17 U/L (ref 15–41)
Albumin: 3.9 g/dL (ref 3.5–5.0)
Alkaline Phosphatase: 59 U/L (ref 38–126)
Anion gap: 10 (ref 5–15)
BUN: 21 mg/dL (ref 8–23)
CO2: 24 mmol/L (ref 22–32)
Calcium: 9.3 mg/dL (ref 8.9–10.3)
Chloride: 104 mmol/L (ref 98–111)
Creatinine, Ser: 1.87 mg/dL — ABNORMAL HIGH (ref 0.61–1.24)
GFR calc Af Amer: 42 mL/min — ABNORMAL LOW (ref >60)
GFR calc non Af Amer: 36 mL/min — ABNORMAL LOW (ref >60)
Glucose, Bld: 103 mg/dL — ABNORMAL HIGH (ref 70–99)
Potassium: 4.3 mmol/L (ref 3.5–5.1)
Sodium: 138 mmol/L (ref 135–145)
Total Bilirubin: 0.4 mg/dL (ref 0.3–1.2)
Total Protein: 7.4 g/dL (ref 6.5–8.1)

## 2018-05-24 LAB — CBC WITH DIFFERENTIAL/PLATELET
Abs Immature Granulocytes: 0.01 10*3/uL (ref 0.00–0.07)
Basophils Absolute: 0 10*3/uL (ref 0.0–0.1)
Basophils Relative: 0 %
EOS PCT: 2 %
Eosinophils Absolute: 0.1 10*3/uL (ref 0.0–0.5)
HCT: 40.1 % (ref 39.0–52.0)
Hemoglobin: 12.6 g/dL — ABNORMAL LOW (ref 13.0–17.0)
Immature Granulocytes: 0 %
Lymphocytes Relative: 26 %
Lymphs Abs: 1.2 10*3/uL (ref 0.7–4.0)
MCH: 29.4 pg (ref 26.0–34.0)
MCHC: 31.4 g/dL (ref 30.0–36.0)
MCV: 93.5 fL (ref 80.0–100.0)
Monocytes Absolute: 0.5 10*3/uL (ref 0.1–1.0)
Monocytes Relative: 10 %
Neutro Abs: 2.9 10*3/uL (ref 1.7–7.7)
Neutrophils Relative %: 62 %
Platelets: 154 10*3/uL (ref 150–400)
RBC: 4.29 MIL/uL (ref 4.22–5.81)
RDW: 14.9 % (ref 11.5–15.5)
WBC: 4.7 10*3/uL (ref 4.0–10.5)
nRBC: 0 % (ref 0.0–0.2)

## 2018-05-24 LAB — TSH: TSH: 17.257 u[IU]/mL — ABNORMAL HIGH (ref 0.350–4.500)

## 2018-05-24 LAB — PSA: Prostatic Specific Antigen: 39.6 ng/mL — ABNORMAL HIGH (ref 0.00–4.00)

## 2018-05-24 LAB — LACTATE DEHYDROGENASE: LDH: 107 U/L (ref 98–192)

## 2018-05-24 MED ORDER — FAMOTIDINE IN NACL 20-0.9 MG/50ML-% IV SOLN
20.0000 mg | Freq: Once | INTRAVENOUS | Status: AC
  Administered 2018-05-24: 20 mg via INTRAVENOUS
  Filled 2018-05-24: qty 50

## 2018-05-24 MED ORDER — DIPHENHYDRAMINE HCL 50 MG/ML IJ SOLN
50.0000 mg | Freq: Once | INTRAMUSCULAR | Status: AC
  Administered 2018-05-24: 50 mg via INTRAVENOUS
  Filled 2018-05-24: qty 1

## 2018-05-24 MED ORDER — SODIUM CHLORIDE 0.9 % IV SOLN
309.5000 mg | Freq: Once | INTRAVENOUS | Status: AC
  Administered 2018-05-24: 310 mg via INTRAVENOUS
  Filled 2018-05-24: qty 31

## 2018-05-24 MED ORDER — SODIUM CHLORIDE 0.9 % IV SOLN
Freq: Once | INTRAVENOUS | Status: AC
  Administered 2018-05-24: 10:00:00 via INTRAVENOUS

## 2018-05-24 MED ORDER — HEPARIN SOD (PORK) LOCK FLUSH 100 UNIT/ML IV SOLN
500.0000 [IU] | Freq: Once | INTRAVENOUS | Status: AC | PRN
  Administered 2018-05-24: 500 [IU]

## 2018-05-24 MED ORDER — SODIUM CHLORIDE 0.9 % IV SOLN
20.0000 mg | Freq: Once | INTRAVENOUS | Status: AC
  Administered 2018-05-24: 20 mg via INTRAVENOUS
  Filled 2018-05-24: qty 2

## 2018-05-24 MED ORDER — SODIUM CHLORIDE 0.9 % IV SOLN
175.0000 mg/m2 | Freq: Once | INTRAVENOUS | Status: AC
  Administered 2018-05-24: 330 mg via INTRAVENOUS
  Filled 2018-05-24: qty 55

## 2018-05-24 MED ORDER — SODIUM CHLORIDE 0.9% FLUSH
10.0000 mL | INTRAVENOUS | Status: DC | PRN
  Administered 2018-05-24: 10 mL
  Filled 2018-05-24: qty 10

## 2018-05-24 MED ORDER — PALONOSETRON HCL INJECTION 0.25 MG/5ML
0.2500 mg | Freq: Once | INTRAVENOUS | Status: AC
  Administered 2018-05-24: 0.25 mg via INTRAVENOUS
  Filled 2018-05-24: qty 5

## 2018-05-24 MED ORDER — LEVOTHYROXINE SODIUM 50 MCG PO TABS: 50.0000 ug | ORAL_TABLET | Freq: Every day | ORAL | 1 refills | Status: DC

## 2018-05-24 MED ORDER — SODIUM CHLORIDE 0.9 % IV SOLN
200.0000 mg | Freq: Once | INTRAVENOUS | Status: AC
  Administered 2018-05-24: 200 mg via INTRAVENOUS
  Filled 2018-05-24: qty 8

## 2018-05-24 NOTE — Progress Notes (Signed)
Diagnosis Oropharyngeal carcinoma ([place]) - Plan: CBC with Differential, Comprehensive metabolic panel, Lactate dehydrogenase, T4 AND TSH, DISCONTINUED: sodium chloride flush (NS) 0.9 % injection 10 mL, DISCONTINUED: heparin lock flush 100 unit/mL, DISCONTINUED: 0.9 %  sodium chloride infusion, DISCONTINUED: dexamethasone (DECADRON) 20 mg in sodium chloride 0.9 % 50 mL IVPB, DISCONTINUED: diphenhydrAMINE (BENADRYL) injection 50 mg, DISCONTINUED: famotidine (PEPCID) IVPB 20 mg premix, DISCONTINUED: palonosetron (ALOXI) injection 0.25 mg, DISCONTINUED: PACLitaxel (TAXOL) 330 mg in sodium chloride 0.9 % 500 mL chemo infusion (> 40m/m2), DISCONTINUED: CARBOplatin (PARAPLATIN) 310 mg in sodium chloride 0.9 % 100 mL chemo infusion  Staging Cancer Staging Oropharyngeal carcinoma (HCC) Staging form: Pharynx - Oropharynx, AJCC 7th Edition - Clinical: Stage IVA (T4a, N2b, M0) - Unsigned   Assessment and Plan:   1.  Stage IVA Oropharyngeal cancer.  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 S/P curative concomitant chemo/XRT with remission on PET imaging in October 2016.  [facility] and has failed to keep follow-up appointments.  He called in requesting pain medications.  Previously, he was instructed he would no longer be prescribed opiods by PA Kefalas.    Pt was recommended for PET scan but did not have imaging done.  He was set up for CT neck CAP that was done on 09/20/2017 and showed   IMPRESSION:  CT neck:  1. Limited assessment of the pharynx due to motion. 2. Decreased size of low-density left level II lymph node. No evidence of new or progressive cervical lymphadenopathy.  1. Interval development of bulky mediastinal adenopathy concerning for metastatic disease. 2. Interval development of pulmonary nodules within the left lower lobe concerning for the possibility of pulmonary metastatic disease. 3.  No definite metastatic disease identified within the abdomen or pelvis. Multiple hepatic cysts are demonstrated.  Pt was referred to Dr. TBenjamine Molaand was seen by him on 10/07/2017 and recommended PET/CT.  Pt did not have imaging done.  He has smoking history and reports that he quit smoking but started Vaping.    PET scan done 02/18/2018 reviewed and showed  IMPRESSION: 1. Interval development of hypermetabolic mediastinal and left hilar lymphadenopathy associated with small hypermetabolic left lung nodules. Metastatic disease a distinct consideration and left lung primary with metastatic disease to the left hilum and mediastinum not excluded. 2. Persistent hypermetabolic activity in the right prostate gland. 3. Similar appearance of diffuse FDG uptake in the tongue and muscles of the left neck, likely related to movement.  Pt was referred to CT surgery for evaluation for EBUS/Bronch for tissue diagnosis of lung findings.    Pt underwent Video bronchoscopy with brushings and endobronchial biopsies and endobronchial ultrasound with mediastinal lymph node needle aspirations on 04/15/2018  SURGEON:  SModesto Charon MD  FINDINGS:  Markedly enlarged 4R and 7 lymph nodes, mucosal tumor and left main stem bronchus, likely due to invasion from the subcarinal lymph nodes.  Brushings showed carcinoma.  Path  Bronchus, biopsy, Left Mainstem done 04/15/2018 - SQUAMOUS CELL CARCINOMA, BASALOID.  Basaloid SCCs are frequently associated with HPV 16 positivity and are most common in the oropharynx, ie, the tonsil or base of tongue which indicates  findings likely originated from original head and neck cancer history.   MRI of brain to complete staging evaluation was done 04/25/2018 and was negative for metastatic disease.    Pt was presented options of therapy with Taxol 175 mg/m2 with carboplatin AUC of 5 every  3 weeks with Keytruda 200 mg IV every 3 weeks based on results of Keynote -48 trial that  established Keytruda for metastatic SCC of head and neck with platinum based therapy . The addition of Keytruda to platinum based therapy improved OS.  In pts with large tumor burden, the regimen also showed more rapid tumor response.  Goals of therapy are disease control.  Pt has been seen by RT in [place] with no therapy planned.     Foundation one testing show no actionable mutations.  PDL1 is pending.  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  Pt has undergone Port placement.  Pt is here for evaluation prior to C2 of Taxol and Carboplatin and C1 of [place]bro.  Labs done 05/24/2018 reviewed and showed WBC 4.7 HB 12.6 plts 154,000.  Chemistries WNL with K+ 4.3 Cr 1.87 and normal LFTs.  He will proceed with therapy.   Pt will RTC in 3 weeks prior to next treatment.  He should notify the office if any problems prior to his next visit.    2.  Dysphagia.  Pt was seen by GI and underwent EGD on 01/15/2018 and showed esophagitis, gastritis and hiatal hernia.  Pt was dilated.   Follow-up with GI as directed.  He feels symptoms improved with dilatation.  Pt was seen by Dr. Benjamine Mola 09/24/2017 and had laryngoscopy done that was negative for recurrent mass or lesion.    3.  Pain.  Pt was previously referred to pain clinic for evaluation and management.  He denies any significant pain today.    4.  RI.  Labs done 05/24/2018 reviewed and showed HB 12.6 with Cr 1.87.   Pt previously referred to nephrology. Pt was treated in the past with Cisplatin.  Will need to closely follow renal function with Carboplatin.    5.  Hypothyroidism.  TSH noted to be elevated at 8.8 on labs done 08/10/2017.  He has not followed up with PCP or endocrinology.  Pt was prescribed Synthroid 25 mcg po daily initially and follow TFTs due to risk for worsening hypothyroidism with Keytruda.   He reports he is taking Synthroid as directed.  TSH again elevated at 17.257.  Will increase Synthroid to 50 mcg po daily and repeat labs in 3-6 weeks.    6.  Elevated PSA.  PSA 40.   PET scan done in 01/2018 showed no bone lesions.  Will determine if pt is being followed by urology and if not will refer.    7.  Noncompliance. Pt has not followed up with Dr. Benjamine Mola or this office and has not had imaging done as previously recommended. Social work evaluation to assess deterrents to compliance.    8.  Smoking.  Pt reports he is no longer smoking cigarettes but he is vaping.  I have discussed with him potential risks of vaping as well as smoking.    25 minutes spent with more than 50% spent in counseling and coordination of care.    Interval History: Historical data obtained from note dated 08/14/2017.   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 S/P curative concomitant chemo/XRT with remission on PET imaging in October 2016.  Current Status:  Pt is seen today for follow-up.  He reports he is doing well.  Denies trouble swallowing or SOB.     Problem List  Patient Active Problem List   Diagnosis Date Noted  . Gastritis and gastroduodenitis [K29.70, K7.90]   . Dysphagia [R13.10] 11/09/2017  .  History of oropharyngeal cancer [Z85.819] 11/09/2017  . Oropharyngeal carcinoma ([place]) [C10.9] 07/28/2014    Past Medical History Past Medical History:  Diagnosis Date  . GERD (gastroesophageal reflux disease)   . 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 Oropharyngeal cancer ([place]) 07/28/2014   dx. 3 weeks ago.- Dr. Oneal [facility] [place], [place].  Marland Kitchen 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[place]) 08/06/14   SCCa of Left BOT    Past Surgical History Past Surgical History:  Procedure Laterality Date  . BIOPSY  01/15/2018   Procedure: BIOPSY;  Surgeon: Danie Binder, MD;  [facility];  Service: Endoscopy;;  gastric  . COLONOSCOPY N/A 03/13/2016   Procedure: COLONOSCOPY;  Surgeon: Danie Binder, MD;  [facility];  Service: Endoscopy;  Laterality: N/A;  2:15 PM  .  ESOPHAGOGASTRODUODENOSCOPY (EGD) WITH PROPOFOL N/A 08/17/2014   Procedure: ESOPHAGOGASTRODUODENOSCOPY (EGD) WITH PROPOFOL (procedure #1);  Surgeon: Aviva Signs Md, MD;  Location: [facility];  Service: General;  Laterality: N/A;  . ESOPHAGOGASTRODUODENOSCOPY (EGD) WITH PROPOFOL N/A 01/15/2018   Procedure: ESOPHAGOGASTRODUODENOSCOPY (EGD) WITH PROPOFOL;  Surgeon: Danie Binder, MD;  [facility];  Service: Endoscopy;  Laterality: N/A;  9:30am  . MULTIPLE EXTRACTIONS WITH ALVEOLOPLASTY N/A 08/12/2014   Procedure: Extraction of tooth #'s 6,17,22,23,24,25,26,27 with alveoloplasty;  Surgeon: Lenn Cal, DDS;  Location: [facility];  Service: Oral Surgery;  Laterality: N/A;  . PANENDOSCOPY N/A 08/06/2014   Procedure: PANENDOSCOPY WITH BIOPSY;  Surgeon: Leta Baptist, MD;  Location: [facility];  Service: ENT;  Laterality: N/A;  . PEG PLACEMENT Left 08/17/14  . PEG PLACEMENT N/A 08/17/2014   Procedure: PERCUTANEOUS ENDOSCOPIC GASTROSTOMY (PEG) PLACEMENT (procedure #1);  Surgeon: Aviva Signs Md, MD;  Location: [facility];  Service: General;  Laterality: N/A;  . PORT-A-CATH REMOVAL Right 07/17/2016   Procedure: MINOR REMOVAL PORT-A-CATH;  Surgeon: Aviva Signs, MD;  Location: [facility];  Service: General;  Laterality: Right;  . PORTACATH PLACEMENT Right 08/17/14  . PORTACATH PLACEMENT Right 08/17/2014   Procedure: INSERTION PORT-A-CATH (procedure #2);  Surgeon: Aviva Signs Md, MD;  Location: [facility];  Service: General;  Laterality: Right;  . PORTACATH PLACEMENT Left 04/26/2018   Procedure: INSERTION PORT-A-CATH (attached catheter in left subclavian);  Surgeon: Aviva Signs, MD;  Location: [facility];  Service: General;  Laterality: Left;  . SAVORY DILATION N/A 01/15/2018   Procedure: SAVORY DILATION;  Surgeon: Danie Binder, MD;  [facility];  Service: Endoscopy;  Laterality: N/A;  . VIDEO BRONCHOSCOPY WITH ENDOBRONCHIAL ULTRASOUND N/A 04/15/2018   Procedure: VIDEO BRONCHOSCOPY WITH  ENDOBRONCHIAL ULTRASOUND;  Surgeon: Melrose Nakayama, MD;  Location: [facility] OR;  Service: Thoracic;  Laterality: N/A;    Family History Family History  Problem Relation Age of Onset  . Colon cancer Neg Hx   . Gastric cancer Neg Hx   . Esophageal cancer Neg Hx      Social History  reports that he quit smoking about 3 years ago. He has a 15.00 pack-year smoking history. He has never used smokeless tobacco. He reports previous alcohol use. He reports that he does not use drugs.  Medications  Current Outpatient Medications:  .  HYDROcodone-acetaminophen (NORCO/VICODIN) 5-325 MG tablet, Take by mouth., Disp: , Rfl:  .  CARBOPLATIN IV, Inject into the vein every 21 ( twenty-one) days., Disp: , Rfl:  .  dexamethasone (DECADRON) 4 MG tablet, Take 2 tablets (8 mg total)  by mouth daily. Start the day after chemotherapy for 2 days., Disp: 30 tablet, Rfl: 1 .  feeding supplement, ENSURE ENLIVE, (ENSURE ENLIVE) LIQD, Take 237 mLs by mouth 2 (two) times daily., Disp: , Rfl:  .  levothyroxine (SYNTHROID) 50 MCG tablet, Take 1 tablet (50 mcg total) by mouth daily before breakfast., Disp: 30 tablet, Rfl: 1 .  lidocaine-prilocaine (EMLA) cream, Apply to affected area once (Patient taking differently: Apply to port a cath site and cover with plastic wrap one hour prior to appointment), Disp: 30 g, Rfl: 3 .  LORazepam (ATIVAN) 0.5 MG tablet, Take 1 tablet (0.5 mg total) by mouth every 6 (six) hours as needed (Nausea or vomiting)., Disp: 30 tablet, Rfl: 0 .  omeprazole (PRILOSEC) 20 MG capsule, 1 PO 30 MINS PRIOR TO BREAKFAST. (Patient taking differently: Take 20 mg by mouth daily before breakfast. 1 PO 30 MINS PRIOR TO BREAKFAST.), Disp: 90 capsule, Rfl: 3 .  ondansetron (ZOFRAN) 8 MG tablet, Take 1 tablet (8 mg total) by mouth 2 (two) times daily as needed for refractory nausea / vomiting. Start on day 3 after chemo., Disp: 30 tablet, Rfl: 1 .  PACLitaxel (TAXOL IV), Inject into the vein every 21 (  twenty-one) days., Disp: , Rfl:  .  Pembrolizumab (KEYTRUDA IV), Inject into the vein every 21 ( twenty-one) days., Disp: , Rfl:  .  prochlorperazine (COMPAZINE) 10 MG tablet, Take 1 tablet (10 mg total) by mouth every 6 (six) hours as needed (Nausea or vomiting)., Disp: 30 tablet, Rfl: 1 No current facility-administered medications for this visit.   Facility-Administered Medications Ordered in Other Visits:  .  CARBOplatin (PARAPLATIN) 310 mg in sodium chloride 0.9 % 250 mL chemo infusion, 310 mg, Intravenous, Once, Dakarai Mcglocklin, MD, Last Rate: 562 mL/hr at 05/24/18 1537, 310 mg at 05/24/18 1537 .  heparin lock flush 100 unit/mL, 500 Units, Intracatheter, Once PRN, Lelar Farewell, MD .  sodium chloride flush (NS) 0.9 % injection 10 mL, 10 mL, Intracatheter, PRN, Makeisha Jentsch, MD, 10 mL at 05/24/18 6222  Allergies Patient has no known allergies.  Review of Systems Review of Systems - Oncology ROS negative   Physical Exam  Vitals Wt Readings from Last 3 Encounters:  05/24/18 153 lb 1.6 oz (69.4 kg)  05/14/18 153 lb 12.8 oz (69.8 kg)  05/03/18 152 lb 12.8 oz (69.3 kg)   Temp Readings from Last 3 Encounters:  05/24/18 97.9 F (36.6 C) (Oral)  05/14/18 (!) 96.5 F (35.8 C) (Oral)  05/03/18 (!) 97.5 F (36.4 C) (Oral)   BP Readings from Last 3 Encounters:  05/24/18 122/75  05/14/18 118/71  05/03/18 132/70   Pulse Readings from Last 3 Encounters:  05/24/18 66  05/14/18 78  05/03/18 (!) 58   Constitutional: Well-developed, well-nourished, and in no distress.   HENT: Head: Normocephalic and atraumatic.  Mouth/Throat: No oropharyngeal exudate. Mucosa moist. Eyes: Pupils are equal, round, and reactive to light. Conjunctivae are normal. No scleral icterus.  Neck: Normal range of motion. Neck supple. No JVD present.  Cardiovascular: Normal rate, regular rhythm and normal heart sounds.  Exam reveals no gallop and no friction rub.   No murmur heard. Pulmonary/Chest: Effort normal  and breath sounds normal. No respiratory distress. No wheezes.No rales.  Abdominal: Soft. Bowel sounds are normal. No distension. There is no tenderness. There is no guarding.  Musculoskeletal: No edema or tenderness.  Lymphadenopathy: No cervical, axillary or supraclavicular adenopathy.  Neurological: Alert and oriented to person, place, and time.  No cranial nerve deficit.  Skin: Skin is warm and dry. No rash noted. No erythema. No pallor.  Psychiatric: Affect and judgment normal.   Labs Office Visit on 05/24/2018  Component Date Value Ref Range Status  . TSH 05/24/2018 17.257* 0.350 - 4.500 uIU/mL Final   Comment: Performed by a 3rd Generation assay with a functional sensitivity of <=0.01 uIU/mL. Performed at [facility], [address], [place], [place] [address]   Appointment on 05/24/2018  Component Date Value Ref Range Status  . WBC 05/24/2018 4.7  4.0 - 10.5 K/uL Final  . RBC 05/24/2018 4.29  4.22 - 5.81 MIL/uL Final  . Hemoglobin 05/24/2018 12.6* 13.0 - 17.0 g/dL Final  . HCT 05/24/2018 40.1  39.0 - 52.0 % Final  . MCV 05/24/2018 93.5  80.0 - 100.0 fL Final  . MCH 05/24/2018 29.4  26.0 - 34.0 pg Final  . MCHC 05/24/2018 31.4  30.0 - 36.0 g/dL Final  . RDW 05/24/2018 14.9  11.5 - 15.5 % Final  . Platelets 05/24/2018 154  150 - 400 K/uL Final  . nRBC 05/24/2018 0.0  0.0 - 0.2 % Final  . Neutrophils Relative % 05/24/2018 62  % Final  . Neutro Abs 05/24/2018 2.9  1.7 - 7.7 K/uL Final  . Lymphocytes Relative 05/24/2018 26  % Final  . Lymphs Abs 05/24/2018 1.2  0.7 - 4.0 K/uL Final  . Monocytes Relative 05/24/2018 10  % Final  . Monocytes Absolute 05/24/2018 0.5  0.1 - 1.0 K/uL Final  . Eosinophils Relative 05/24/2018 2  % Final  . Eosinophils Absolute 05/24/2018 0.1  0.0 - 0.5 K/uL Final  . Basophils Relative 05/24/2018 0  % Final  . Basophils Absolute 05/24/2018 0.0  0.0 - 0.1 K/uL Final  . Immature Granulocytes 05/24/2018 0  % Final  . Abs Immature Granulocytes 05/24/2018  0.01  0.00 - 0.07 K/uL Final   Performed at [facility], [address], [place], [place] [address]  . Sodium 05/24/2018 138  135 - 145 mmol/L Final  . Potassium 05/24/2018 4.3  3.5 - 5.1 mmol/L Final  . Chloride 05/24/2018 104  98 - 111 mmol/L Final  . CO2 05/24/2018 24  22 - 32 mmol/L Final  . Glucose, Bld 05/24/2018 103* 70 - 99 mg/dL Final  . BUN 05/24/2018 21  8 - 23 mg/dL Final  . Creatinine, Ser 05/24/2018 1.87* 0.61 - 1.24 mg/dL Final  . Calcium 05/24/2018 9.3  8.9 - 10.3 mg/dL Final  . Total Protein 05/24/2018 7.4  6.5 - 8.1 g/dL Final  . Albumin 05/24/2018 3.9  3.5 - 5.0 g/dL Final  . AST 05/24/2018 17  15 - 41 U/L Final  . ALT 05/24/2018 18  0 - 44 U/L Final  . Alkaline Phosphatase 05/24/2018 59  38 - 126 U/L Final  . Total Bilirubin 05/24/2018 0.4  0.3 - 1.2 mg/dL Final  . GFR calc non Af Amer 05/24/2018 36* >60 mL/min Final  . GFR calc Af Amer 05/24/2018 42* >60 mL/min Final  . Anion gap 05/24/2018 10  5 - 15 Final   Performed at [facility], [address], [place], [place] [address]  . LDH 05/24/2018 107  98 - 192 U/L Final   Performed at [facility], [address] [place], [place] [address]     Pathology Orders Placed This Encounter  Procedures  . T4  . TSH  . CBC with Differential    Standing Status:   Future    Standing  Expiration Date:   05/25/2019  . Comprehensive metabolic panel    Standing Status:   Future    Standing Expiration Date:   05/25/2019  . Lactate dehydrogenase    Standing Status:   Future    Standing Expiration Date:   05/25/2019  . T4 AND TSH    Standing Status:   Future    Standing Expiration Date:   05/25/2019       Zoila Shutter MD

## 2018-05-24 NOTE — Progress Notes (Signed)
Patient seen with lab review by the oncologist and ok to treat today verbal order Dr. Walden Field.  Patient tolerated chemotherapy with no complaints voiced.  Port site clean and dry with no bruising or swelling noted at site.  Good blood return noted before and after administration of chemotherapy.  Band aid applied.  Patient left ambulatory with VSS and no s/s of distress noted.

## 2018-05-24 NOTE — Patient Instructions (Signed)
[facility] Discharge Instructions for Patients Receiving Chemotherapy  Today you received the following chemotherapy agents  If you develop nausea and vomiting that is not controlled by your nausea medication, call the clinic.   BELOW ARE SYMPTOMS THAT SHOULD BE REPORTED IMMEDIATELY:  *FEVER GREATER THAN 100.5 F  *CHILLS WITH OR WITHOUT FEVER  NAUSEA AND VOMITING THAT IS NOT CONTROLLED WITH YOUR NAUSEA MEDICATION  *UNUSUAL SHORTNESS OF BREATH  *UNUSUAL BRUISING OR BLEEDING  TENDERNESS IN MOUTH AND THROAT WITH OR WITHOUT PRESENCE OF ULCERS  *URINARY PROBLEMS  *BOWEL PROBLEMS  UNUSUAL RASH Items with * indicate a potential emergency and should be followed up as soon as possible.  Feel free to call the clinic should you have any questions or concerns. The clinic phone number is [phone].  Please show the CHEMO ALERT CARD at check-in to the Emergency Department and triage nurse.   

## 2018-05-24 NOTE — Progress Notes (Signed)
1145 Pt instructed to increase his Synthroid to 50 mcg daily instead of 25 mcg per MD order with understanding verbalized. Script sent in to pharmacy by Dr. Walden Field

## 2018-05-25 LAB — T4: T4, Total: 5.3 ug/dL (ref 4.5–12.0)

## 2018-05-27 ENCOUNTER — Inpatient Hospital Stay (HOSPITAL_COMMUNITY): Payer: Medicare Other | Attending: Hematology

## 2018-05-27 ENCOUNTER — Encounter (HOSPITAL_COMMUNITY): Payer: Self-pay

## 2018-05-27 VITALS — BP 115/50 | HR 68 | Temp 97.8°F | Resp 18 | Wt 157.0 lb

## 2018-05-27 DIAGNOSIS — M8589 Other specified disorders of bone density and structure, multiple sites: Secondary | ICD-10-CM | POA: Insufficient documentation

## 2018-05-27 DIAGNOSIS — C109 Malignant neoplasm of oropharynx, unspecified: Secondary | ICD-10-CM | POA: Diagnosis not present

## 2018-05-27 DIAGNOSIS — Z5111 Encounter for antineoplastic chemotherapy: Secondary | ICD-10-CM | POA: Diagnosis not present

## 2018-05-27 DIAGNOSIS — E039 Hypothyroidism, unspecified: Secondary | ICD-10-CM | POA: Insufficient documentation

## 2018-05-27 DIAGNOSIS — N189 Chronic kidney disease, unspecified: Secondary | ICD-10-CM | POA: Diagnosis not present

## 2018-05-27 DIAGNOSIS — Z5189 Encounter for other specified aftercare: Secondary | ICD-10-CM | POA: Insufficient documentation

## 2018-05-27 DIAGNOSIS — Z5112 Encounter for antineoplastic immunotherapy: Secondary | ICD-10-CM | POA: Diagnosis present

## 2018-05-27 DIAGNOSIS — C7802 Secondary malignant neoplasm of left lung: Secondary | ICD-10-CM | POA: Insufficient documentation

## 2018-05-27 DIAGNOSIS — Z87891 Personal history of nicotine dependence: Secondary | ICD-10-CM | POA: Insufficient documentation

## 2018-05-27 DIAGNOSIS — Z79899 Other long term (current) drug therapy: Secondary | ICD-10-CM | POA: Insufficient documentation

## 2018-05-27 MED ORDER — PEGFILGRASTIM-CBQV 6 MG/0.6ML ~~LOC~~ SOSY
6.0000 mg | PREFILLED_SYRINGE | Freq: Once | SUBCUTANEOUS | Status: AC
  Administered 2018-05-27: 6 mg via SUBCUTANEOUS
  Filled 2018-05-27: qty 0.6

## 2018-05-27 NOTE — Patient Instructions (Signed)
[place] at [facility] Discharge Instructions  Received Udenyca injection today. Follow-up as scheduled. Call clinic for any questions or concerns   Thank you for choosing [place] at [facility] to provide your oncology and hematology care.  To afford each patient quality time with our provider, please arrive at least 15 minutes before your scheduled appointment time.   If you have a lab appointment with the [facility] please come in thru the  Main Entrance and check in at the main information desk  You need to re-schedule your appointment should you arrive 10 or more minutes late.  We strive to give you quality time with our providers, and arriving late affects you and other patients whose appointments are after yours.  Also, if you no show three or more times for appointments you may be dismissed from the clinic at the providers discretion.     Again, thank you for choosing [facility].  Our hope is that these requests will decrease the amount of time that you wait before being seen by our physicians.       _____________________________________________________________  Should you have questions after your visit to [facility], please contact our office at [phone] between the hours of 8:00 a.m. and 4:30 p.m.  Voicemails left after 4:00 p.m. will not be returned until the following business day.  For prescription refill requests, have your pharmacy contact our office and allow 72 hours.    Cancer Center Support Programs:   > Cancer Support Group  2nd Tuesday of the month 1pm-2pm, Journey Room

## 2018-05-27 NOTE — Progress Notes (Signed)
Alexander [name] tolerated Udenyca injection well without complaints or incident. VSS Reviewed purpose and side effects of Udenyca with pt who verbalized understanding. Pt discharged self ambulatory in satisfactory condition

## 2018-06-14 ENCOUNTER — Inpatient Hospital Stay (HOSPITAL_COMMUNITY): Payer: Medicare Other

## 2018-06-14 ENCOUNTER — Inpatient Hospital Stay (HOSPITAL_BASED_OUTPATIENT_CLINIC_OR_DEPARTMENT_OTHER): Payer: Medicare Other | Admitting: Hematology

## 2018-06-14 ENCOUNTER — Encounter (HOSPITAL_COMMUNITY): Payer: Self-pay | Admitting: Hematology

## 2018-06-14 ENCOUNTER — Other Ambulatory Visit: Payer: Self-pay

## 2018-06-14 ENCOUNTER — Other Ambulatory Visit (HOSPITAL_COMMUNITY): Payer: Self-pay

## 2018-06-14 VITALS — BP 127/71 | HR 69 | Temp 97.5°F | Resp 18 | Wt 154.2 lb

## 2018-06-14 DIAGNOSIS — C109 Malignant neoplasm of oropharynx, unspecified: Secondary | ICD-10-CM | POA: Diagnosis not present

## 2018-06-14 DIAGNOSIS — Z87891 Personal history of nicotine dependence: Secondary | ICD-10-CM

## 2018-06-14 DIAGNOSIS — E038 Other specified hypothyroidism: Secondary | ICD-10-CM

## 2018-06-14 DIAGNOSIS — M8589 Other specified disorders of bone density and structure, multiple sites: Secondary | ICD-10-CM | POA: Diagnosis not present

## 2018-06-14 DIAGNOSIS — C3492 Malignant neoplasm of unspecified part of left bronchus or lung: Secondary | ICD-10-CM

## 2018-06-14 DIAGNOSIS — E039 Hypothyroidism, unspecified: Secondary | ICD-10-CM

## 2018-06-14 DIAGNOSIS — C7802 Secondary malignant neoplasm of left lung: Secondary | ICD-10-CM

## 2018-06-14 DIAGNOSIS — Z5112 Encounter for antineoplastic immunotherapy: Secondary | ICD-10-CM | POA: Diagnosis not present

## 2018-06-14 LAB — CBC WITH DIFFERENTIAL/PLATELET
Abs Immature Granulocytes: 0.01 10*3/uL (ref 0.00–0.07)
Basophils Absolute: 0 10*3/uL (ref 0.0–0.1)
Basophils Relative: 0 %
Eosinophils Absolute: 0 10*3/uL (ref 0.0–0.5)
Eosinophils Relative: 1 %
HEMATOCRIT: 34.6 % — AB (ref 39.0–52.0)
Hemoglobin: 11.1 g/dL — ABNORMAL LOW (ref 13.0–17.0)
Immature Granulocytes: 0 %
LYMPHS ABS: 1 10*3/uL (ref 0.7–4.0)
Lymphocytes Relative: 22 %
MCH: 29.4 pg (ref 26.0–34.0)
MCHC: 32.1 g/dL (ref 30.0–36.0)
MCV: 91.8 fL (ref 80.0–100.0)
MONOS PCT: 11 %
Monocytes Absolute: 0.5 10*3/uL (ref 0.1–1.0)
Neutro Abs: 3.1 10*3/uL (ref 1.7–7.7)
Neutrophils Relative %: 66 %
Platelets: 216 10*3/uL (ref 150–400)
RBC: 3.77 MIL/uL — ABNORMAL LOW (ref 4.22–5.81)
RDW: 15.7 % — ABNORMAL HIGH (ref 11.5–15.5)
WBC: 4.7 10*3/uL (ref 4.0–10.5)
nRBC: 0 % (ref 0.0–0.2)

## 2018-06-14 LAB — COMPREHENSIVE METABOLIC PANEL
ALT: 18 U/L (ref 0–44)
AST: 18 U/L (ref 15–41)
Albumin: 3.8 g/dL (ref 3.5–5.0)
Alkaline Phosphatase: 68 U/L (ref 38–126)
Anion gap: 10 (ref 5–15)
BILIRUBIN TOTAL: 0.5 mg/dL (ref 0.3–1.2)
BUN: 23 mg/dL (ref 8–23)
CO2: 25 mmol/L (ref 22–32)
Calcium: 9.5 mg/dL (ref 8.9–10.3)
Chloride: 101 mmol/L (ref 98–111)
Creatinine, Ser: 1.85 mg/dL — ABNORMAL HIGH (ref 0.61–1.24)
GFR calc Af Amer: 42 mL/min — ABNORMAL LOW (ref >60)
GFR calc non Af Amer: 37 mL/min — ABNORMAL LOW (ref >60)
Glucose, Bld: 127 mg/dL — ABNORMAL HIGH (ref 70–99)
Potassium: 4.3 mmol/L (ref 3.5–5.1)
Sodium: 136 mmol/L (ref 135–145)
Total Protein: 7.4 g/dL (ref 6.5–8.1)

## 2018-06-14 LAB — LACTATE DEHYDROGENASE: LDH: 127 U/L (ref 98–192)

## 2018-06-14 LAB — TSH: TSH: 6.602 u[IU]/mL — ABNORMAL HIGH (ref 0.350–4.500)

## 2018-06-14 MED ORDER — SODIUM CHLORIDE 0.9% FLUSH
10.0000 mL | INTRAVENOUS | Status: DC | PRN
  Administered 2018-06-14: 10 mL
  Filled 2018-06-14: qty 10

## 2018-06-14 MED ORDER — SODIUM CHLORIDE 0.9 % IV SOLN: Freq: Once | INTRAVENOUS | Status: DC

## 2018-06-14 MED ORDER — SODIUM CHLORIDE 0.9 % IV SOLN
175.0000 mg/m2 | Freq: Once | INTRAVENOUS | Status: AC
  Administered 2018-06-14: 330 mg via INTRAVENOUS
  Filled 2018-06-14: qty 55

## 2018-06-14 MED ORDER — SODIUM CHLORIDE 0.9 % IV SOLN
20.0000 mg | Freq: Once | INTRAVENOUS | Status: AC
  Administered 2018-06-14: 20 mg via INTRAVENOUS
  Filled 2018-06-14: qty 2

## 2018-06-14 MED ORDER — HEPARIN SOD (PORK) LOCK FLUSH 100 UNIT/ML IV SOLN
500.0000 [IU] | Freq: Once | INTRAVENOUS | Status: AC | PRN
  Administered 2018-06-14: 500 [IU]

## 2018-06-14 MED ORDER — HEPARIN SOD (PORK) LOCK FLUSH 100 UNIT/ML IV SOLN
INTRAVENOUS | Status: AC
  Filled 2018-06-14: qty 5

## 2018-06-14 MED ORDER — FAMOTIDINE IN NACL 20-0.9 MG/50ML-% IV SOLN
20.0000 mg | Freq: Once | INTRAVENOUS | Status: AC
  Administered 2018-06-14: 20 mg via INTRAVENOUS
  Filled 2018-06-14: qty 50

## 2018-06-14 MED ORDER — SODIUM CHLORIDE 0.9 % IV SOLN
311.5000 mg | Freq: Once | INTRAVENOUS | Status: AC
  Administered 2018-06-14: 310 mg via INTRAVENOUS
  Filled 2018-06-14: qty 31

## 2018-06-14 MED ORDER — PALONOSETRON HCL INJECTION 0.25 MG/5ML
0.2500 mg | Freq: Once | INTRAVENOUS | Status: AC
  Administered 2018-06-14: 0.25 mg via INTRAVENOUS
  Filled 2018-06-14: qty 5

## 2018-06-14 MED ORDER — DIPHENHYDRAMINE HCL 50 MG/ML IJ SOLN
50.0000 mg | Freq: Once | INTRAMUSCULAR | Status: AC
  Administered 2018-06-14: 50 mg via INTRAVENOUS
  Filled 2018-06-14: qty 1

## 2018-06-14 MED ORDER — SODIUM CHLORIDE 0.9 % IV SOLN
Freq: Once | INTRAVENOUS | Status: AC
  Administered 2018-06-14: 10:00:00 via INTRAVENOUS

## 2018-06-14 MED ORDER — SODIUM CHLORIDE 0.9 % IV SOLN
200.0000 mg | Freq: Once | INTRAVENOUS | Status: AC
  Administered 2018-06-14: 200 mg via INTRAVENOUS
  Filled 2018-06-14: qty 8

## 2018-06-14 NOTE — Assessment & Plan Note (Signed)
1.  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  Persistent hypermetabolic activity in the right prostate gland. -PDL 1 testing was not done.  Foundation 1 testing shows MS-stable, TMB-60mts/mb, NOTCH 1 Loss, PTEN loss, RB1 loss, FAS loss - He was treated with carboplatin and paclitaxel cycle 1 on 05/03/2018. - Cycle 2 was on 05/24/2018 with carboplatin, paclitaxel and pembrolizumab. -I reviewed his labs today.  He has baseline CKD with creatinine around 1.8 which is stable.  He does not report any immunotherapy related side effects.  He does not have any neuropathy. - He may proceed with his cycle 3 today. -I plan to repeat PET CT scan in 3 weeks to evaluate response.  2.  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 - PET CT scan did not show any hypermetabolic lymphadenopathy in the neck on 02/08/2018.  3.  Hypothyroidism: -His TSH at last visit was elevated at 17.5. -We are repeating another TSH today.

## 2018-06-14 NOTE — Patient Instructions (Addendum)
[place] at [facility] Discharge Instructions  You were seen today by Dr. Delton Coombes. He went over your recent lab results. He will see you back in 3 weeks for labs, PET scan and follow up.   Thank you for choosing [place] at [facility] to provide your oncology and hematology care.  To afford each patient quality time with our provider, please arrive at least 15 minutes before your scheduled appointment time.   If you have a lab appointment with the [facility] please come in thru the  Main Entrance and check in at the main information desk  You need to re-schedule your appointment should you arrive 10 or more minutes late.  We strive to give you quality time with our providers, and arriving late affects you and other patients whose appointments are after yours.  Also, if you no show three or more times for appointments you may be dismissed from the clinic at the providers discretion.     Again, thank you for choosing [facility].  Our hope is that these requests will decrease the amount of time that you wait before being seen by our physicians.       _____________________________________________________________  Should you have questions after your visit to [facility], please contact our office at [phone] between the hours of 8:00 a.m. and 4:30 p.m.  Voicemails left after 4:00 p.m. will not be returned until the following business day.  For prescription refill requests, have your pharmacy contact our office and allow 72 hours.    Cancer Center Support Programs:   > Cancer Support Group  2nd Tuesday of the month 1pm-2pm, Journey Room

## 2018-06-14 NOTE — Progress Notes (Signed)
3383 Labs reviewed with Francene Finders NP and pt seen by Dr. Delton Coombes and pt approved for chemo tx today per NP                                                                      Alexander [name] tolerated chemo tx well without complaints or incident. VSS upon discharge. Pt discharged self ambulatory in satisfactory condition

## 2018-06-14 NOTE — Patient Instructions (Signed)
[facility] Discharge Instructions for Patients Receiving Chemotherapy   Beginning January 23rd 2017 lab work for the [facility] will be done in the  Main lab at [facility] on 1st floor. If you have a lab appointment with the [facility] please come in thru the  Main Entrance and check in at the main information desk   Today you received the following chemotherapy agents Taxol,Carboplatin and Keytruda. Follow-up as scheduled. Call clinic for any questions or concerns  To help prevent nausea and vomiting after your treatment, we encourage you to take your nausea medication   If you develop nausea and vomiting, or diarrhea that is not controlled by your medication, call the clinic.  The clinic phone number is [phone]. Office hours are Monday-Friday 8:30am-5:00pm.  BELOW ARE SYMPTOMS THAT SHOULD BE REPORTED IMMEDIATELY:  *FEVER GREATER THAN 101.0 F  *CHILLS WITH OR WITHOUT FEVER  NAUSEA AND VOMITING THAT IS NOT CONTROLLED WITH YOUR NAUSEA MEDICATION  *UNUSUAL SHORTNESS OF BREATH  *UNUSUAL BRUISING OR BLEEDING  TENDERNESS IN MOUTH AND THROAT WITH OR WITHOUT PRESENCE OF ULCERS  *URINARY PROBLEMS  *BOWEL PROBLEMS  UNUSUAL RASH Items with * indicate a potential emergency and should be followed up as soon as possible. If you have an emergency after office hours please contact your primary care physician or go to the nearest emergency department.  Please call the clinic during office hours if you have any questions or concerns.   You may also contact the Patient Navigator at [phone] should you have any questions or need assistance in obtaining follow up care.      Resources For Cancer Patients and their Caregivers ? American Cancer Society: Can assist with transportation, wigs, general needs, runs Look Good Feel Better.        825-756-9071 ? Cancer Care: Provides financial assistance, online support groups, medication/co-pay assistance.   1-800-813-HOPE 210-730-2573) ? [facility] Assists [place] cancer patients and their families through emotional , educational and financial support.  (516)659-9461 ? [place] DSS Where to apply for food stamps, Medicaid and utility assistance. (478) 228-5409 ? [facility]: Transportation to medical appointments. (303) 888-8622 ? Social Security Administration: May apply for disability if have a Stage IV cancer. ([phone] 331 611 6576 ? [employer], Disability and Transit Services: Assists with nutrition, care and transit needs. 573-630-4785

## 2018-06-14 NOTE — Progress Notes (Signed)
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    07/28/2014 Initial Diagnosis    Oropharyngeal cancer    08/03/2014 Imaging    CT CAP- L supraclavicular lymphadenopathy is not completely visualized. This is better seen on the previous neck CT from 07/27/2014. Otherwise, no evidence for metastatic disease in the chest, abdomen, or pelvis.    08/03/2014 Imaging    Bone scan- Uptake at adjacent anterior LEFT 6, 7, 8 ribs likely representing trauma/fractures. Questionable nonspecific increased tracer localization at the posterior RIGHT 8th and 9th ribs, the adjacent nature which raises a a question of trauma as well    08/06/2014 Pathology Results    Dr. Benjamine Mola- Oropharynx, biopsy, Left - INVASIVE SQUAMOUS CELL CARCINOMA.    08/12/2014 Procedure    Dr. Enrique Sack- 1. Multiple extraction of tooth numbers 6, 17, 22, 23, 24, 25, 26, and 27. 3 Quadrants of alveoloplasty    08/17/2014 Pathology Results    PORT and G-TUBE placed by Dr. Carlis Stable.    08/26/2014 PET scan    Large hypermetabolic mass in the left base of tongue. Activity extends across midline to the right base tongue. 2. Intensely hypermetabolic left cervical metastatic lymph nodes. Lymph nodes extend from the left level II position to the left supraclavi    09/01/2014 - 09/22/2014 Chemotherapy    Concurrent chemoradiation with Cisplatin 100 mg/m2 x 2 cycles with Neulasta support. Held cycle #3 d/t renal toxicity.     09/03/2014 - 10/23/2014 Radiation Therapy    Treated in [place], IMRT Isidore Moos).  Base of tongue and bilat neck. Total dose: 70 Gy in 35  fractions. (of note, he did miss several treatments requiring BID dosing towards the end of treatment).     01/25/2015 PET scan    Near complete resolution of metabolic activity at the base of tongue. Minimal residual activity is likely post treatment effect. 2. Complete resolution of metabolic activity above LEFT cervical lymph nodes. No evidence of residual metabolically active     5/63/8756 Procedure    Port-a-cath removed [name])     05/03/2018 -  Chemotherapy    The patient had palonosetron (ALOXI) injection 0.25 mg, 0.25 mg, Intravenous,  Once, 2 of 4 cycles Administration: 0.25 mg (05/03/2018), 0.25 mg (05/24/2018) pegfilgrastim-cbqv (UDENYCA) injection 6 mg, 6 mg, Subcutaneous, Once, 1 of 3 cycles Administration: 6 mg (05/27/2018) CARBOplatin (PARAPLATIN) 380 mg in sodium chloride 0.9 % 250 mL chemo infusion, 380 mg (100 % of original dose 381 mg), Intravenous,  Once, 2 of 4 cycles Dose modification:   (original dose 381 mg, Cycle 1),   (original dose 309.5 mg, Cycle 2) Administration: 380 mg (05/03/2018), 310 mg (05/24/2018) PACLitaxel (TAXOL) 330 mg in sodium chloride 0.9 % 500 mL chemo infusion (> 15m/m2), 175 mg/m2 = 330 mg (100 % of original dose 175 mg/m2), Intravenous,  Once, 2 of 4 cycles Dose modification: 175 mg/m2 (original dose 175 mg/m2, Cycle 1, Reason: Patient Age) Administration: 330 mg (05/03/2018), 330 mg (05/24/2018)  for chemotherapy treatment.     05/24/2018 -  Chemotherapy    The patient had pembrolizumab (  KEYTRUDA) 200 mg in sodium chloride 0.9 % 50 mL chemo infusion, 200 mg, Intravenous, Once, 1 of 6 cycles Administration: 200 mg (05/24/2018)  for chemotherapy treatment.       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 male returns for routine follow-up. He is here today alone. He states that he had  some joint pain with the shot but took aleve to help that. He states that he did well after the last treatment. Denies any nausea, vomiting, or diarrhea. Denies any new pains. Had not noticed any recent bleeding such as epistaxis, hematuria or hematochezia. Denies recent chest pain on exertion, shortness of breath on minimal exertion, pre-syncopal episodes, or palpitations. Denies any numbness or tingling in hands or feet. Denies any recent fevers, infections, or recent hospitalizations. Patient reports appetite at 100% and energy level at 100%.    REVIEW OF SYSTEMS:  Review of Systems  HENT:   Positive for trouble swallowing.   All other systems reviewed and are negative.    PAST MEDICAL/SURGICAL HISTORY:  Past Medical History:  Diagnosis Date  . GERD (gastroesophageal reflux disease)   . 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 3 weeks ago.- Dr. Oneal [facility] [place], [place].  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 BIOPSY  01/15/2018   Procedure: BIOPSY;  Surgeon: Danie Binder, MD;  [facility];  Service: Endoscopy;;  gastric  . COLONOSCOPY N/A 03/13/2016   Procedure: COLONOSCOPY;  Surgeon: Danie Binder, MD;  [facility];  Service: Endoscopy;  Laterality: N/A;  2:15 PM  . ESOPHAGOGASTRODUODENOSCOPY (EGD) WITH PROPOFOL N/A 08/17/2014   Procedure: ESOPHAGOGASTRODUODENOSCOPY (EGD) WITH PROPOFOL (procedure #1);  Surgeon: Aviva Signs Md, MD;  Location: [facility];  Service: General;  Laterality: N/A;  . ESOPHAGOGASTRODUODENOSCOPY (EGD) WITH PROPOFOL N/A 01/15/2018   Procedure: ESOPHAGOGASTRODUODENOSCOPY (EGD) WITH PROPOFOL;  Surgeon: Danie Binder, MD;  [facility];  Service: Endoscopy;  Laterality: N/A;  9:30am  . MULTIPLE EXTRACTIONS WITH ALVEOLOPLASTY N/A 08/12/2014    Procedure: Extraction of tooth #'s 6,17,22,23,24,25,26,27 with alveoloplasty;  Surgeon: Lenn Cal, DDS;  Location: [facility];  Service: Oral Surgery;  Laterality: N/A;  . PANENDOSCOPY N/A 08/06/2014   Procedure: PANENDOSCOPY WITH BIOPSY;  Surgeon: Leta Baptist, MD;  Location: [facility];  Service: ENT;  Laterality: N/A;  . PEG PLACEMENT Left 08/17/14  . PEG PLACEMENT N/A 08/17/2014   Procedure: PERCUTANEOUS ENDOSCOPIC GASTROSTOMY (PEG) PLACEMENT (procedure #1);  Surgeon: Aviva Signs Md, MD;  Location: [facility];  Service: General;  Laterality: N/A;  . PORT-A-CATH REMOVAL Right 07/17/2016   Procedure: MINOR REMOVAL PORT-A-CATH;  Surgeon: Aviva Signs, MD;  Location: [facility];  Service: General;  Laterality: Right;  . PORTACATH PLACEMENT Right 08/17/14  . PORTACATH PLACEMENT Right 08/17/2014   Procedure: INSERTION PORT-A-CATH (procedure #2);  Surgeon: Aviva Signs Md, MD;  Location: [facility];  Service: General;  Laterality: Right;  . PORTACATH PLACEMENT Left 04/26/2018   Procedure: INSERTION PORT-A-CATH (attached catheter in left subclavian);  Surgeon: Aviva Signs, MD;  Location: [facility];  Service: General;  Laterality: Left;  . SAVORY DILATION N/A 01/15/2018  Procedure: SAVORY DILATION;  Surgeon: Fields, Sandi L, MD;  Location[facility];  Service: Endoscopy;  Laterality: N/A;  . VIDEO BRONCHOSCOPY WITH ENDOBRONCHIAL ULTRASOUND N/A 04/15/2018   Procedure: VIDEO BRONCHOSCOPY WITH ENDOBRONCHIAL ULTRASOUND;  Surgeon: Hendrickson, Steven C, MD;  Location: [facility];  Service: Thoracic;  Laterality: N/A;     SOCIAL HISTORY:  Social History   Socioeconomic History  . Marital status: Legally Separated    Spouse name: Not on file  . Number of children: 5  . Years of education: Not on file  . Highest education level: Not on file  Occupational History  . Not on file  Social Needs  . Financial resource strain: Not on file  . Food insecurity:    Worry: Not on file    Inability: Not on  file  . Transportation needs:    Medical: Not on file    Non-medical: Not on file  Tobacco Use  . Smoking status: Former Smoker    Packs/day: 0.50    Years: 30.00    Pack years: 15.00    Last attempt to quit: 07/22/2014    Years since quitting: 3.8  . Smokeless tobacco: Never Used  Substance and Sexual Activity  . Alcohol use: Not Currently    Alcohol/week: 0.0 standard drinks    Comment: None currently (11/09/17); previously 1-2 beers on the weekend  . Drug use: No  . Sexual activity: Not on file  Lifestyle  . Physical activity:    Days per week: Not on file    Minutes per session: Not on file  . Stress: Not on file  Relationships  . Social connections:    Talks on phone: Not on file    Gets together: Not on file    Attends religious service: Not on file    Active member of club or organization: Not on file    Attends meetings of clubs or organizations: Not on file    Relationship status: Not on file  . Intimate partner violence:    Fear of current or ex partner: Not on file    Emotionally abused: Not on file    Physically abused: Not on file    Forced sexual activity: Not on file  Other Topics Concern  . Not on file  Social History Narrative  . Not on file    FAMILY HISTORY:  Family History  Problem Relation Age of Onset  . Colon cancer Neg Hx   . Gastric cancer Neg Hx   . Esophageal cancer Neg Hx     CURRENT MEDICATIONS:  Outpatient Encounter Medications as of 06/14/2018  Medication Sig  . CARBOPLATIN IV Inject into the vein every 21 ( twenty-one) days.  . dexamethasone (DECADRON) 4 MG tablet Take 2 tablets (8 mg total) by mouth daily. Start the day after chemotherapy for 2 days.  . feeding supplement, ENSURE ENLIVE, (ENSURE ENLIVE) LIQD Take 237 mLs by mouth 2 (two) times daily.  . HYDROcodone-acetaminophen (NORCO/VICODIN) 5-325 MG tablet Take by mouth.  . levothyroxine (SYNTHROID) 50 MCG tablet Take 1 tablet (50 mcg total) by mouth daily before breakfast.   . lidocaine-prilocaine (EMLA) cream Apply to affected area once (Patient taking differently: Apply to port a cath site and cover with plastic wrap one hour prior to appointment)  . LORazepam (ATIVAN) 0.5 MG tablet Take 1 tablet (0.5 mg total) by mouth every 6 (six) hours as needed (Nausea or vomiting).  . omeprazole (PRILOSEC) 20 MG capsule 1 PO 30 MINS PRIOR   TO BREAKFAST. (Patient taking differently: Take 20 mg by mouth daily before breakfast. 1 PO 30 MINS PRIOR TO BREAKFAST.)  . ondansetron (ZOFRAN) 8 MG tablet Take 1 tablet (8 mg total) by mouth 2 (two) times daily as needed for refractory nausea / vomiting. Start on day 3 after chemo.  . PACLitaxel (TAXOL IV) Inject into the vein every 21 ( twenty-one) days.  . Pembrolizumab (KEYTRUDA IV) Inject into the vein every 21 ( twenty-one) days.  . prochlorperazine (COMPAZINE) 10 MG tablet Take 1 tablet (10 mg total) by mouth every 6 (six) hours as needed (Nausea or vomiting).   No facility-administered encounter medications on file as of 06/14/2018.     ALLERGIES:  No Known Allergies   PHYSICAL EXAM:  ECOG Performance status: 1  Vitals:   06/14/18 0832  BP: 137/69  Pulse: 78  Resp: 18  Temp: 98.2 F (36.8 C)  SpO2: 99%   There were no vitals filed for this visit.  Physical Exam Constitutional:      Appearance: Normal appearance.  Cardiovascular:     Rate and Rhythm: Normal rate and regular rhythm.     Heart sounds: Normal heart sounds.  Pulmonary:     Effort: Pulmonary effort is normal.     Breath sounds: Normal breath sounds.  Abdominal:     General: Bowel sounds are normal.     Palpations: Abdomen is soft. There is no mass.  Musculoskeletal:     Left lower leg: No edema.  Skin:    General: Skin is warm.  Neurological:     General: No focal deficit present.     Mental Status: He is alert and oriented to person, place, and time.  Psychiatric:        Mood and Affect: Mood normal.        Behavior: Behavior normal.       LABORATORY DATA:  I have reviewed the labs as listed.  CBC    Component Value Date/Time   WBC 4.7 06/14/2018 0838   RBC 3.77 (L) 06/14/2018 0838   HGB 11.1 (L) 06/14/2018 0838   HCT 34.6 (L) 06/14/2018 0838   PLT 216 06/14/2018 0838   MCV 91.8 06/14/2018 0838   MCH 29.4 06/14/2018 0838   MCHC 32.1 06/14/2018 0838   RDW 15.7 (H) 06/14/2018 0838   LYMPHSABS 1.0 06/14/2018 0838   MONOABS 0.5 06/14/2018 0838   EOSABS 0.0 06/14/2018 0838   BASOSABS 0.0 06/14/2018 0838   CMP Latest Ref Rng & Units 06/14/2018 05/24/2018 05/02/2018  Glucose 70 - 99 mg/dL 127(H) 103(H) 88  BUN 8 - 23 mg/dL 23 21 22  Creatinine 0.61 - 1.24 mg/dL 1.85(H) 1.87(H) 1.79(H)  Sodium 135 - 145 mmol/L 136 138 140  Potassium 3.5 - 5.1 mmol/L 4.3 4.3 4.5  Chloride 98 - 111 mmol/L 101 104 104  CO2 22 - 32 mmol/L 25 24 27  Calcium 8.9 - 10.3 mg/dL 9.5 9.3 9.4  Total Protein 6.5 - 8.1 g/dL 7.4 7.4 7.2  Total Bilirubin 0.3 - 1.2 mg/dL 0.5 0.4 0.5  Alkaline Phos 38 - 126 U/L 68 59 53  AST 15 - 41 U/L 18 17 18  ALT 0 - 44 U/L 18 18 17       DIAGNOSTIC IMAGING:  I have independently reviewed the scans and discussed with the patient.   I have reviewed Alexander Travis LPN's note and agree with the documentation.  I personally performed a face-to-face visit, made revisions and my assessment and   plan is as follows.    ASSESSMENT & PLAN:   Squamous cell lung cancer, left (HCC) 1.  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  Persistent hypermetabolic activity in the right prostate gland. -PDL 1 testing was not done.  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 - Cycle 2 was on 05/24/2018 with  carboplatin, paclitaxel and pembrolizumab. -I reviewed his labs today.  He has baseline CKD with creatinine around 1.8 which is stable.  He does not report any immunotherapy related side effects.  He does not have any neuropathy. - He may proceed with his cycle 3 today. -I plan to repeat PET CT scan in 3 weeks to evaluate response.  2.  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 - PET CT scan did not show any hypermetabolic lymphadenopathy in the neck on 02/08/2018.  3.  Hypothyroidism: -His TSH at last visit was elevated at 17.5. -We are repeating another TSH today.      Orders placed this encounter:  Orders Placed This Encounter  Procedures  . NM PET Image Restag (PS) Skull Base To Thigh      Sreedhar Katragadda, MD [facility] [phone]    

## 2018-06-14 NOTE — Patient Instructions (Signed)
[facility] at [facility] Discharge Instructions  Labs drawn from portacath today   Thank you for choosing [facility] at [facility] to provide your oncology and hematology care.  To afford each patient quality time with our provider, please arrive at least 15 minutes before your scheduled appointment time.   If you have a lab appointment with the Cancer Center please come in thru the  Main Entrance and check in at the main information desk  You need to re-schedule your appointment should you arrive 10 or more minutes late.  We strive to give you quality time with our providers, and arriving late affects you and other patients whose appointments are after yours.  Also, if you no show three or more times for appointments you may be dismissed from the clinic at the providers discretion.     Again, thank you for choosing [facility].  Our hope is that these requests will decrease the amount of time that you wait before being seen by our physicians.       _____________________________________________________________  Should you have questions after your visit to [facility], please contact our office at [phone] between the hours of 8:00 a.m. and 4:30 p.m.  Voicemails left after 4:00 p.m. will not be returned until the following business day.  For prescription refill requests, have your pharmacy contact our office and allow 72 hours.    Cancer Center Support Programs:   > Cancer Support Group  2nd Tuesday of the month 1pm-2pm, Journey Room   

## 2018-06-15 LAB — T4: T4 TOTAL: 7 ug/dL (ref 4.5–12.0)

## 2018-06-17 ENCOUNTER — Other Ambulatory Visit: Payer: Self-pay

## 2018-06-17 ENCOUNTER — Inpatient Hospital Stay (HOSPITAL_COMMUNITY): Payer: Medicare Other

## 2018-06-17 VITALS — BP 133/72 | HR 65 | Temp 97.9°F | Resp 18

## 2018-06-17 DIAGNOSIS — C109 Malignant neoplasm of oropharynx, unspecified: Secondary | ICD-10-CM

## 2018-06-17 DIAGNOSIS — Z5112 Encounter for antineoplastic immunotherapy: Secondary | ICD-10-CM | POA: Diagnosis not present

## 2018-06-17 MED ORDER — PEGFILGRASTIM-CBQV 6 MG/0.6ML ~~LOC~~ SOSY
6.0000 mg | PREFILLED_SYRINGE | Freq: Once | SUBCUTANEOUS | Status: AC
  Administered 2018-06-17: 6 mg via SUBCUTANEOUS
  Filled 2018-06-17: qty 0.6

## 2018-06-17 NOTE — Progress Notes (Signed)
Alexander [name] presents today for injection per MD orders. Udenyca administered SQ in left Upper Arm. Administration without incident. Patient tolerated well.  Vital signs stable. No complaints at this time. Discharged from clinic ambulatory. F/U with [facility] as scheduled.

## 2018-06-17 NOTE — Patient Instructions (Signed)
[facility] at [facility]  Discharge Instructions:   _______________________________________________________________  Thank you for choosing [facility] at [facility] to provide your oncology and hematology care.  To afford each patient quality time with our providers, please arrive at least 15 minutes before your scheduled appointment.  You need to re-schedule your appointment if you arrive 10 or more minutes late.  We strive to give you quality time with our providers, and arriving late affects you and other patients whose appointments are after yours.  Also, if you no show three or more times for appointments you may be dismissed from the clinic.  Again, thank you for choosing  Cancer Center at [facility]. Our hope is that these requests will allow you access to exceptional care and in a timely manner. _______________________________________________________________  If you have questions after your visit, please contact our office at [phone] between the hours of 8:30 a.m. and 5:00 p.m. Voicemails left after 4:30 p.m. will not be returned until the following business day. _______________________________________________________________  For prescription refill requests, have your pharmacy contact our office. _______________________________________________________________  Recommendations made by the consultant and any test results will be sent to your referring physician. _______________________________________________________________ 

## 2018-06-26 ENCOUNTER — Other Ambulatory Visit (HOSPITAL_COMMUNITY): Payer: Self-pay | Admitting: Internal Medicine

## 2018-06-26 DIAGNOSIS — E038 Other specified hypothyroidism: Secondary | ICD-10-CM

## 2018-06-27 NOTE — Telephone Encounter (Signed)
Alexander [name] patient refill request.

## 2018-07-03 ENCOUNTER — Other Ambulatory Visit (HOSPITAL_COMMUNITY): Payer: Self-pay

## 2018-07-03 DIAGNOSIS — C109 Malignant neoplasm of oropharynx, unspecified: Secondary | ICD-10-CM

## 2018-07-03 DIAGNOSIS — C3492 Malignant neoplasm of unspecified part of left bronchus or lung: Secondary | ICD-10-CM

## 2018-07-05 ENCOUNTER — Encounter (HOSPITAL_COMMUNITY): Payer: Medicare Other

## 2018-07-09 ENCOUNTER — Inpatient Hospital Stay (HOSPITAL_BASED_OUTPATIENT_CLINIC_OR_DEPARTMENT_OTHER): Payer: Medicare Other | Admitting: Hematology

## 2018-07-09 ENCOUNTER — Encounter (HOSPITAL_COMMUNITY): Payer: Self-pay

## 2018-07-09 ENCOUNTER — Inpatient Hospital Stay (HOSPITAL_COMMUNITY): Payer: Medicare Other

## 2018-07-09 ENCOUNTER — Inpatient Hospital Stay (HOSPITAL_COMMUNITY): Payer: Medicare Other | Attending: Hematology

## 2018-07-09 ENCOUNTER — Encounter (HOSPITAL_COMMUNITY): Payer: Self-pay | Admitting: Hematology

## 2018-07-09 ENCOUNTER — Other Ambulatory Visit: Payer: Self-pay

## 2018-07-09 VITALS — BP 122/70 | HR 65 | Temp 97.4°F | Resp 18

## 2018-07-09 DIAGNOSIS — Z5112 Encounter for antineoplastic immunotherapy: Secondary | ICD-10-CM | POA: Insufficient documentation

## 2018-07-09 DIAGNOSIS — Z87891 Personal history of nicotine dependence: Secondary | ICD-10-CM | POA: Diagnosis not present

## 2018-07-09 DIAGNOSIS — R972 Elevated prostate specific antigen [PSA]: Secondary | ICD-10-CM | POA: Insufficient documentation

## 2018-07-09 DIAGNOSIS — Z79899 Other long term (current) drug therapy: Secondary | ICD-10-CM

## 2018-07-09 DIAGNOSIS — E039 Hypothyroidism, unspecified: Secondary | ICD-10-CM | POA: Insufficient documentation

## 2018-07-09 DIAGNOSIS — C109 Malignant neoplasm of oropharynx, unspecified: Secondary | ICD-10-CM | POA: Insufficient documentation

## 2018-07-09 DIAGNOSIS — Z5189 Encounter for other specified aftercare: Secondary | ICD-10-CM | POA: Insufficient documentation

## 2018-07-09 DIAGNOSIS — C3492 Malignant neoplasm of unspecified part of left bronchus or lung: Secondary | ICD-10-CM | POA: Diagnosis not present

## 2018-07-09 LAB — CBC WITH DIFFERENTIAL/PLATELET
Abs Immature Granulocytes: 0.02 10*3/uL (ref 0.00–0.07)
Basophils Absolute: 0 10*3/uL (ref 0.0–0.1)
Basophils Relative: 0 %
Eosinophils Absolute: 0.1 10*3/uL (ref 0.0–0.5)
Eosinophils Relative: 2 %
HCT: 32.1 % — ABNORMAL LOW (ref 39.0–52.0)
Hemoglobin: 10.5 g/dL — ABNORMAL LOW (ref 13.0–17.0)
Immature Granulocytes: 0 %
Lymphocytes Relative: 22 %
Lymphs Abs: 1.1 10*3/uL (ref 0.7–4.0)
MCH: 30.5 pg (ref 26.0–34.0)
MCHC: 32.7 g/dL (ref 30.0–36.0)
MCV: 93.3 fL (ref 80.0–100.0)
Monocytes Absolute: 0.7 10*3/uL (ref 0.1–1.0)
Monocytes Relative: 15 %
Neutro Abs: 3.1 10*3/uL (ref 1.7–7.7)
Neutrophils Relative %: 61 %
Platelets: 266 10*3/uL (ref 150–400)
RBC: 3.44 MIL/uL — ABNORMAL LOW (ref 4.22–5.81)
RDW: 16.7 % — ABNORMAL HIGH (ref 11.5–15.5)
WBC: 5 10*3/uL (ref 4.0–10.5)
nRBC: 0 % (ref 0.0–0.2)

## 2018-07-09 LAB — COMPREHENSIVE METABOLIC PANEL
ALT: 16 U/L (ref 0–44)
AST: 20 U/L (ref 15–41)
Albumin: 3.6 g/dL (ref 3.5–5.0)
Alkaline Phosphatase: 64 U/L (ref 38–126)
Anion gap: 10 (ref 5–15)
BUN: 22 mg/dL (ref 8–23)
CO2: 24 mmol/L (ref 22–32)
Calcium: 9.1 mg/dL (ref 8.9–10.3)
Chloride: 105 mmol/L (ref 98–111)
Creatinine, Ser: 1.64 mg/dL — ABNORMAL HIGH (ref 0.61–1.24)
GFR calc Af Amer: 49 mL/min — ABNORMAL LOW (ref >60)
GFR calc non Af Amer: 42 mL/min — ABNORMAL LOW (ref >60)
Glucose, Bld: 134 mg/dL — ABNORMAL HIGH (ref 70–99)
Potassium: 3.7 mmol/L (ref 3.5–5.1)
Sodium: 139 mmol/L (ref 135–145)
Total Bilirubin: 0.6 mg/dL (ref 0.3–1.2)
Total Protein: 7.3 g/dL (ref 6.5–8.1)

## 2018-07-09 MED ORDER — SODIUM CHLORIDE 0.9 % IV SOLN
335.5000 mg | Freq: Once | INTRAVENOUS | Status: AC
  Administered 2018-07-09: 340 mg via INTRAVENOUS
  Filled 2018-07-09: qty 34

## 2018-07-09 MED ORDER — PALONOSETRON HCL INJECTION 0.25 MG/5ML
0.2500 mg | Freq: Once | INTRAVENOUS | Status: AC
  Administered 2018-07-09: 0.25 mg via INTRAVENOUS
  Filled 2018-07-09: qty 5

## 2018-07-09 MED ORDER — FAMOTIDINE IN NACL 20-0.9 MG/50ML-% IV SOLN: 20.0000 mg | Freq: Once | INTRAVENOUS | Status: DC

## 2018-07-09 MED ORDER — SODIUM CHLORIDE 0.9 % IV SOLN
Freq: Once | INTRAVENOUS | Status: AC
  Administered 2018-07-09: 09:00:00 via INTRAVENOUS

## 2018-07-09 MED ORDER — SODIUM CHLORIDE 0.9 % IV SOLN
200.0000 mg | Freq: Once | INTRAVENOUS | Status: AC
  Administered 2018-07-09: 200 mg via INTRAVENOUS
  Filled 2018-07-09: qty 8

## 2018-07-09 MED ORDER — HEPARIN SOD (PORK) LOCK FLUSH 100 UNIT/ML IV SOLN
500.0000 [IU] | Freq: Once | INTRAVENOUS | Status: AC | PRN
  Administered 2018-07-09: 500 [IU]

## 2018-07-09 MED ORDER — SODIUM CHLORIDE 0.9 % IV SOLN
175.0000 mg/m2 | Freq: Once | INTRAVENOUS | Status: AC
  Administered 2018-07-09: 330 mg via INTRAVENOUS
  Filled 2018-07-09: qty 55

## 2018-07-09 MED ORDER — DIPHENHYDRAMINE HCL 50 MG/ML IJ SOLN
50.0000 mg | Freq: Once | INTRAMUSCULAR | Status: AC
  Administered 2018-07-09: 09:00:00 50 mg via INTRAVENOUS
  Filled 2018-07-09: qty 1

## 2018-07-09 MED ORDER — FAMOTIDINE IN NACL 20-0.9 MG/50ML-% IV SOLN
20.0000 mg | Freq: Once | INTRAVENOUS | Status: AC
  Administered 2018-07-09: 10:00:00 20 mg via INTRAVENOUS
  Filled 2018-07-09: qty 50

## 2018-07-09 MED ORDER — SODIUM CHLORIDE 0.9% FLUSH: 10.0000 mL | INTRAVENOUS | Status: DC | PRN

## 2018-07-09 MED ORDER — SODIUM CHLORIDE 0.9 % IV SOLN
20.0000 mg | Freq: Once | INTRAVENOUS | Status: AC
  Administered 2018-07-09: 20 mg via INTRAVENOUS
  Filled 2018-07-09: qty 20

## 2018-07-09 MED ORDER — SODIUM CHLORIDE 0.9 % IV SOLN
20.0000 mg | Freq: Once | INTRAVENOUS | Status: DC
  Filled 2018-07-09: qty 2

## 2018-07-09 MED ORDER — HYDROCODONE-ACETAMINOPHEN 5-325 MG PO TABS: 1.0000 | ORAL_TABLET | Freq: Three times a day (TID) | ORAL | 0 refills | Status: DC | PRN

## 2018-07-09 NOTE — Addendum Note (Signed)
Addended by: Farley Ly on: 07/09/2018 09:57 AM   Modules accepted: Orders

## 2018-07-09 NOTE — Patient Instructions (Addendum)
[place] at [facility] Discharge Instructions  You were seen today by Dr. Delton Coombes. He went over your recent lab results. We will get your PET scan rescheduled.  He will see you back in 3 weeks for labs, treatment and follow up.   Thank you for choosing [place] at [facility] to provide your oncology and hematology care.  To afford each patient quality time with our provider, please arrive at least 15 minutes before your scheduled appointment time.   If you have a lab appointment with the [place] please come in thru the  Main Entrance and check in at the main information desk  You need to re-schedule your appointment should you arrive 10 or more minutes late.  We strive to give you quality time with our providers, and arriving late affects you and other patients whose appointments are after yours.  Also, if you no show three or more times for appointments you may be dismissed from the clinic at the providers discretion.     Again, thank you for choosing [facility].  Our hope is that these requests will decrease the amount of time that you wait before being seen by our physicians.       _____________________________________________________________  Should you have questions after your visit to [facility], please contact our office at [phone] between the hours of 8:00 a.m. and 4:30 p.m.  Voicemails left after 4:00 p.m. will not be returned until the following business day.  For prescription refill requests, have your pharmacy contact our office and allow 72 hours.    Cancer Center Support Programs:   > Cancer Support Group  2nd Tuesday of the month 1pm-2pm, Journey Room

## 2018-07-09 NOTE — Progress Notes (Signed)
[place] 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    07/28/2014 Initial Diagnosis    Oropharyngeal cancer    08/03/2014 Imaging    CT CAP- L supraclavicular lymphadenopathy is not completely visualized. This is better seen on the previous neck CT from 07/27/2014. Otherwise, no evidence for metastatic disease in the chest, abdomen, or pelvis.    08/03/2014 Imaging    Bone scan- Uptake at adjacent anterior LEFT 6, 7, 8 ribs likely representing trauma/fractures. Questionable nonspecific increased tracer localization at the posterior RIGHT 8th and 9th ribs, the adjacent nature which raises a a question of trauma as well    08/06/2014 Pathology Results    Dr. Benjamine Mola- Oropharynx, biopsy, Left - INVASIVE SQUAMOUS CELL CARCINOMA.    08/12/2014 Procedure    Dr. Enrique Sack- 1. Multiple extraction of tooth numbers 6, 17, 22, 23, 24, 25, 26, and 27. 3 Quadrants of alveoloplasty    08/17/2014 Pathology Results    PORT and G-TUBE placed by Dr. Carlis Stable.    08/26/2014 PET scan    Large hypermetabolic mass in the left base of tongue. Activity extends across midline to the right base tongue. 2. Intensely hypermetabolic left cervical metastatic lymph nodes. Lymph nodes extend from the left level II position to the left supraclavi    09/01/2014 - 09/22/2014 Chemotherapy    Concurrent chemoradiation with Cisplatin 100 mg/m2 x 2 cycles with Neulasta support. Held cycle #3 d/t renal toxicity.     09/03/2014 - 10/23/2014 Radiation Therapy    Treated in [place], IMRT Isidore Moos).  Base of tongue and bilat neck. Total dose: 70 Gy in 35  fractions. (of note, he did miss several treatments requiring BID dosing towards the end of treatment).     01/25/2015 PET scan    Near complete resolution of metabolic activity at the base of tongue. Minimal residual activity is likely post treatment effect. 2. Complete resolution of metabolic activity above LEFT cervical lymph nodes. No evidence of residual metabolically active     09/19/6387 Procedure    Port-a-cath removed [name])     05/03/2018 -  Chemotherapy    The patient had palonosetron (ALOXI) injection 0.25 mg, 0.25 mg, Intravenous,  Once, 3 of 4 cycles Administration: 0.25 mg (05/03/2018), 0.25 mg (05/24/2018), 0.25 mg (06/14/2018) pegfilgrastim-cbqv (UDENYCA) injection 6 mg, 6 mg, Subcutaneous, Once, 2 of 3 cycles Administration: 6 mg (05/27/2018), 6 mg (06/17/2018) CARBOplatin (PARAPLATIN) 380 mg in sodium chloride 0.9 % 250 mL chemo infusion, 380 mg (100 % of original dose 381 mg), Intravenous,  Once, 3 of 4 cycles Dose modification:   (original dose 381 mg, Cycle 1),   (original dose 309.5 mg, Cycle 2) Administration: 380 mg (05/03/2018), 310 mg (05/24/2018), 310 mg (06/14/2018) PACLitaxel (TAXOL) 330 mg in sodium chloride 0.9 % 500 mL chemo infusion (> 69m/m2), 175 mg/m2 = 330 mg (100 % of original dose 175 mg/m2), Intravenous,  Once, 3 of 4 cycles Dose modification: 175 mg/m2 (original dose 175 mg/m2, Cycle 1, Reason: Patient Age) Administration: 330 mg (05/03/2018), 330 mg (05/24/2018), 330 mg (06/14/2018)  for chemotherapy treatment.  05/24/2018 -  Chemotherapy    The patient had pembrolizumab (KEYTRUDA) 200 mg in sodium chloride 0.9 % 50 mL chemo infusion, 200 mg, Intravenous, Once, 2 of 6 cycles Administration: 200 mg (05/24/2018), 200 mg (06/14/2018)  for chemotherapy treatment.       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  Coppolino 68 y.o. male returns for routine follow-up and consideration for next cycle of chemotherapy. He is here today alone. He states that he has been feeling good since his last treatment, he has been working some and staying active at home. Denies any nausea, vomiting, or diarrhea.  He reports some pains in his joints lasting about 7 days after each G-CSF injection.  He is taking Tylenol and Advil every 3 hours.  Had not noticed any recent bleeding such as epistaxis, hematuria or hematochezia. Denies recent chest pain on exertion, shortness of breath on minimal exertion, pre-syncopal episodes, or palpitations. Denies any numbness or tingling in hands or feet. Denies any recent fevers, infections, or recent hospitalizations. Patient reports appetite at 100% and energy level at 100%.     REVIEW OF SYSTEMS:  Review of Systems  Musculoskeletal: Positive for arthralgias.  All other systems reviewed and are negative.    PAST MEDICAL/SURGICAL HISTORY:  Past Medical History:  Diagnosis Date   GERD (gastroesophageal reflux disease)    Mass of neck    dx. oropharyngeal squamous cell carcinoma- Chemo. 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 3 weeks ago.- Dr. Oneal [facility] [place], [place].   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   SCCa of Left BOT   Past Surgical History:  Procedure Laterality Date   BIOPSY  01/15/2018   Procedure: BIOPSY;  Surgeon: Danie Binder, MD;  [facility];  Service: Endoscopy;;  gastric   COLONOSCOPY N/A 03/13/2016   Procedure: COLONOSCOPY;  Surgeon: Danie Binder, MD;  [facility];  Service: Endoscopy;  Laterality: N/A;  2:15 PM   ESOPHAGOGASTRODUODENOSCOPY (EGD) WITH PROPOFOL N/A 08/17/2014   Procedure: ESOPHAGOGASTRODUODENOSCOPY (EGD) WITH PROPOFOL (procedure #1);  Surgeon: Aviva Signs Md, MD;  Location: [facility];  Service: General;  Laterality: N/A;   ESOPHAGOGASTRODUODENOSCOPY (EGD) WITH  PROPOFOL N/A 01/15/2018   Procedure: ESOPHAGOGASTRODUODENOSCOPY (EGD) WITH PROPOFOL;  Surgeon: Danie Binder, MD;  [facility];  Service: Endoscopy;  Laterality: N/A;  9:30am   MULTIPLE EXTRACTIONS WITH ALVEOLOPLASTY N/A 08/12/2014   Procedure: Extraction of tooth #'s 6,17,22,23,24,25,26,27 with alveoloplasty;  Surgeon: Lenn Cal, DDS;  Location: [facility];  Service: Oral Surgery;  Laterality: N/A;   PANENDOSCOPY N/A 08/06/2014   Procedure: PANENDOSCOPY WITH BIOPSY;  Surgeon: Leta Baptist, MD;  Location: [facility];  Service: ENT;  Laterality: N/A;   PEG PLACEMENT Left 08/17/14   PEG PLACEMENT N/A 08/17/2014   Procedure: PERCUTANEOUS ENDOSCOPIC GASTROSTOMY (PEG) PLACEMENT (procedure #1);  Surgeon: Aviva Signs Md, MD;  Location: [facility];  Service: General;  Laterality: N/A;   PORT-A-CATH REMOVAL Right 07/17/2016   Procedure: MINOR REMOVAL PORT-A-CATH;  Surgeon: Aviva Signs, MD;  Location: [facility];  Service: General;  Laterality: Right;   PORTACATH PLACEMENT Right 08/17/14   PORTACATH PLACEMENT Right 08/17/2014   Procedure: INSERTION PORT-A-CATH (procedure #2);  Surgeon: Aviva Signs Md, MD;  Location: [facility];  Service: General;  Laterality: Right;  PORTACATH PLACEMENT Left 04/26/2018   Procedure: INSERTION PORT-A-CATH (attached catheter in left subclavian);  Surgeon: Aviva Signs, MD;  Location: [facility];  Service: General;  Laterality: Left;   SAVORY DILATION N/A 01/15/2018   Procedure: SAVORY DILATION;  Surgeon: Danie Binder, MD;  [facility];  Service: Endoscopy;  Laterality: N/A;   VIDEO BRONCHOSCOPY WITH ENDOBRONCHIAL ULTRASOUND N/A 04/15/2018   Procedure: VIDEO BRONCHOSCOPY WITH ENDOBRONCHIAL ULTRASOUND;  Surgeon: Melrose Nakayama, MD;  Location: [facility];  Service: Thoracic;  Laterality: N/A;     SOCIAL HISTORY:  Social History   Socioeconomic History   Marital status: Legally Separated    Spouse name: Not on file   Number of  children: 5   Years of education: Not on file   Highest education level: Not on file  Occupational History   Not on file  Social Needs   Financial resource strain: Not on file   Food insecurity:    Worry: Not on file    Inability: Not on file   Transportation needs:    Medical: Not on file    Non-medical: Not on file  Tobacco Use   Smoking status: Former Smoker    Packs/day: 0.50    Years: 30.00    Pack years: 15.00    Last attempt to quit: 07/22/2014    Years since quitting: 3.9   Smokeless tobacco: Never Used  Substance and Sexual Activity   Alcohol use: Not Currently    Alcohol/week: 0.0 standard drinks    Comment: None currently (11/09/17); previously 1-2 beers on the weekend   Drug use: No   Sexual activity: Not on file  Lifestyle   Physical activity:    Days per week: Not on file    Minutes per session: Not on file   Stress: Not on file  Relationships   Social connections:    Talks on phone: Not on file    Gets together: Not on file    Attends religious service: Not on file    Active member of club or organization: Not on file    Attends meetings of clubs or organizations: Not on file    Relationship status: Not on file   Intimate partner violence:    Fear of current or ex partner: Not on file    Emotionally abused: Not on file    Physically abused: Not on file    Forced sexual activity: Not on file  Other Topics Concern   Not on file  Social History Narrative   Not on file    FAMILY HISTORY:  Family History  Problem Relation Age of Onset   Colon cancer Neg Hx    Gastric cancer Neg Hx    Esophageal cancer Neg Hx     CURRENT MEDICATIONS:  Outpatient Encounter Medications as of 07/09/2018  Medication Sig   CARBOPLATIN IV Inject into the vein every 21 ( twenty-one) days.   dexamethasone (DECADRON) 4 MG tablet Take 2 tablets (8 mg total) by mouth daily. Start the day after chemotherapy for 2 days.   feeding supplement, ENSURE  ENLIVE, (ENSURE ENLIVE) LIQD Take 237 mLs by mouth 2 (two) times daily.   HYDROcodone-acetaminophen (NORCO/VICODIN) 5-325 MG tablet Take 1 tablet by mouth every 8 (eight) hours as needed for moderate pain.   levothyroxine (SYNTHROID, LEVOTHROID) 50 MCG tablet TAKE 1 TABLET BY MOUTH DAILY BEFORE BREAKAST.   lidocaine-prilocaine (EMLA) cream Apply to affected area once (Patient taking differently: Apply to port a cath site  and cover with plastic wrap one hour prior to appointment)   LORazepam (ATIVAN) 0.5 MG tablet Take 1 tablet (0.5 mg total) by mouth every 6 (six) hours as needed (Nausea or vomiting).   omeprazole (PRILOSEC) 20 MG capsule 1 PO 30 MINS PRIOR TO BREAKFAST. (Patient taking differently: Take 20 mg by mouth daily before breakfast. 1 PO 30 MINS PRIOR TO BREAKFAST.)   ondansetron (ZOFRAN) 8 MG tablet Take 1 tablet (8 mg total) by mouth 2 (two) times daily as needed for refractory nausea / vomiting. Start on day 3 after chemo.   PACLitaxel (TAXOL IV) Inject into the vein every 21 ( twenty-one) days.   Pembrolizumab (KEYTRUDA IV) Inject into the vein every 21 ( twenty-one) days.   prochlorperazine (COMPAZINE) 10 MG tablet Take 1 tablet (10 mg total) by mouth every 6 (six) hours as needed (Nausea or vomiting).   [DISCONTINUED] HYDROcodone-acetaminophen (NORCO/VICODIN) 5-325 MG tablet Take by mouth.   No facility-administered encounter medications on file as of 07/09/2018.     ALLERGIES:  No Known Allergies   PHYSICAL EXAM:  ECOG Performance status: 1  Vitals:   07/09/18 0808  BP: 129/65  Pulse: 70  Resp: 18  Temp: 98.2 F (36.8 C)  SpO2: 100%   Filed Weights   07/09/18 0808  Weight: 155 lb 3.2 oz (70.4 kg)    Physical Exam Vitals signs reviewed.  Constitutional:      Appearance: Normal appearance.  Cardiovascular:     Rate and Rhythm: Normal rate and regular rhythm.     Heart sounds: Normal heart sounds.  Pulmonary:     Effort: Pulmonary effort is normal.       Breath sounds: Normal breath sounds.  Abdominal:     General: There is no distension.     Palpations: Abdomen is soft.  Musculoskeletal:        General: No swelling.  Skin:    General: Skin is warm.  Neurological:     General: No focal deficit present.     Mental Status: He is alert and oriented to person, place, and time.  Psychiatric:        Mood and Affect: Mood normal.        Behavior: Behavior normal.      LABORATORY DATA:  I have reviewed the labs as listed.  CBC    Component Value Date/Time   WBC 5.0 07/09/2018 0745   RBC 3.44 (L) 07/09/2018 0745   HGB 10.5 (L) 07/09/2018 0745   HCT 32.1 (L) 07/09/2018 0745   PLT 266 07/09/2018 0745   MCV 93.3 07/09/2018 0745   MCH 30.5 07/09/2018 0745   MCHC 32.7 07/09/2018 0745   RDW 16.7 (H) 07/09/2018 0745   LYMPHSABS 1.1 07/09/2018 0745   MONOABS 0.7 07/09/2018 0745   EOSABS 0.1 07/09/2018 0745   BASOSABS 0.0 07/09/2018 0745   CMP Latest Ref Rng & Units 07/09/2018 06/14/2018 05/24/2018  Glucose 70 - 99 mg/dL 134(H) 127(H) 103(H)  BUN 8 - 23 mg/dL 22 23 21   Creatinine 0.61 - 1.24 mg/dL 1.64(H) 1.85(H) 1.87(H)  Sodium 135 - 145 mmol/L 139 136 138  Potassium 3.5 - 5.1 mmol/L 3.7 4.3 4.3  Chloride 98 - 111 mmol/L 105 101 104  CO2 22 - 32 mmol/L 24 25 24   Calcium 8.9 - 10.3 mg/dL 9.1 9.5 9.3  Total Protein 6.5 - 8.1 g/dL 7.3 7.4 7.4  Total Bilirubin 0.3 - 1.2 mg/dL 0.6 0.5 0.4  Alkaline Phos 38 - 126 U/L 64  68 59  AST 15 - 41 U/L 20 18 17   ALT 0 - 44 U/L 16 18 18        DIAGNOSTIC IMAGING:  I have independently reviewed the scans and discussed with the patient.   I have reviewed Venita Lick LPN's note and agree with the documentation.  I personally performed a face-to-face visit, made revisions and my assessment and plan is as follows.    ASSESSMENT & PLAN:   Squamous cell lung cancer, left ([place]) 1.  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  Persistent hypermetabolic activity in the right prostate gland. -PDL 1 testing was not done.  Foundation 1 testing shows MS-stable, TMB-64mts/mb, NOTCH 1 Loss, PTEN loss, RB1 loss, FAS loss - 3 cycles of carboplatin, paclitaxel and pembrolizumab from 05/03/2018 through 06/14/2018, with pembrolizumab added during cycle 2. - He denies any new cough or diarrhea.  He denies any GI side effects or numbness. -I have reviewed his labs.  He may proceed with cycle 4 today. -He missed his PET CT last Friday.  We will reschedule it after this cycle. -Depending on the response, we might discontinue chemotherapy and continue maintenance immunotherapy.  If he does not get adequate response, will go 2 more cycles of chemotherapy. -We will see him back in 3 weeks for follow-up. -He is complaining of joint pains lasting about 1 week after each G-CSF injection.  He has taken Aleve and Tylenol every 3 hours which did not help much.  Hence I will give him a limited supply of hydrocodone for this cycle.  2.  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 - PET CT scan did not show any hypermetabolic lymphadenopathy in the neck on 02/08/2018.  3.  Hypothyroidism: -His TSH was 6.6 on 06/14/2018.  Prior TSH was 17.5. -He is not on any thyroid supplements.  We will continue to monitor it closely.  4.  Elevated PSA: -His PSA was elevated at 39.6 on 05/24/2018. -We will plan to repeat it in next few weeks.      Orders placed this encounter:  No orders of the defined types were placed in this encounter.     SDerek Jack MD [phone]

## 2018-07-09 NOTE — Progress Notes (Signed)
Pt presents today for appt with Dr. Delton Coombes and treatment. VSS. Pt has no complaints of any changes since the last visit. MAR reviewed and updated.   Creat 1.64. MD aware. Message received to start treatment per ATravis LPN.   Spoke with patient about On Pro Pump and teaching performed. Pt declines at this time and prefers an injection here at the clinic.     Treatment given today per MD orders. Tolerated infusion without adverse affects. Vital signs stable. No complaints at this time. Discharged from clinic ambulatory. F/U with [facility] as scheduled.

## 2018-07-09 NOTE — Assessment & Plan Note (Addendum)
1.  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  Persistent hypermetabolic activity in the right prostate gland. -PDL 1 testing was not done.  Foundation 1 testing shows MS-stable, TMB-53mts/mb, NOTCH 1 Loss, PTEN loss, RB1 loss, FAS loss - 3 cycles of carboplatin, paclitaxel and pembrolizumab from 05/03/2018 through 06/14/2018, with pembrolizumab added during cycle 2. - He denies any new cough or diarrhea.  He denies any GI side effects or numbness. -I have reviewed his labs.  He may proceed with cycle 4 today. -He missed his PET CT last Friday.  We will reschedule it after this cycle. -Depending on the response, we might discontinue chemotherapy and continue maintenance immunotherapy.  If he does not get adequate response, will go 2 more cycles of chemotherapy. -We will see him back in 3 weeks for follow-up. -He is complaining of joint pains lasting about 1 week after each G-CSF injection.  He has taken Aleve and Tylenol every 3 hours which did not help much.  Hence I will give him a limited supply of hydrocodone for this cycle.  2.  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 - PET CT scan did not show any hypermetabolic lymphadenopathy in the neck on 02/08/2018.  3.  Hypothyroidism: -His TSH was 6.6 on 06/14/2018.  Prior TSH was 17.5. -He is not on any thyroid supplements.  We will continue to monitor it closely.  4.  Elevated PSA: -His PSA was elevated at 39.6 on 05/24/2018. -We will plan to repeat it in next few weeks.

## 2018-07-10 ENCOUNTER — Other Ambulatory Visit (HOSPITAL_COMMUNITY): Payer: Self-pay | Admitting: Internal Medicine

## 2018-07-10 DIAGNOSIS — C109 Malignant neoplasm of oropharynx, unspecified: Secondary | ICD-10-CM

## 2018-07-11 ENCOUNTER — Encounter (HOSPITAL_COMMUNITY): Payer: Self-pay

## 2018-07-11 ENCOUNTER — Inpatient Hospital Stay (HOSPITAL_COMMUNITY): Payer: Medicare Other

## 2018-07-11 ENCOUNTER — Other Ambulatory Visit: Payer: Self-pay

## 2018-07-11 VITALS — BP 139/72 | HR 77 | Temp 97.6°F | Resp 18

## 2018-07-11 DIAGNOSIS — C109 Malignant neoplasm of oropharynx, unspecified: Secondary | ICD-10-CM

## 2018-07-11 DIAGNOSIS — Z5112 Encounter for antineoplastic immunotherapy: Secondary | ICD-10-CM | POA: Diagnosis not present

## 2018-07-11 MED ORDER — PEGFILGRASTIM-CBQV 6 MG/0.6ML ~~LOC~~ SOSY
6.0000 mg | PREFILLED_SYRINGE | Freq: Once | SUBCUTANEOUS | Status: AC
  Administered 2018-07-11: 6 mg via SUBCUTANEOUS

## 2018-07-11 MED ORDER — PEGFILGRASTIM-CBQV 6 MG/0.6ML ~~LOC~~ SOSY
PREFILLED_SYRINGE | SUBCUTANEOUS | Status: AC
  Filled 2018-07-11: qty 0.6

## 2018-07-11 NOTE — Progress Notes (Signed)
Patient tolerated injection with no complaints voiced.  Site clean and dry with no bruising or swelling noted at site.  Band aid applied.  Vss with discharge and left ambulatory with no s/s of distress noted.  

## 2018-07-11 NOTE — Patient Instructions (Signed)
[facility] at [facility]  Discharge Instructions:   _______________________________________________________________  Thank you for choosing [facility] at [facility] to provide your oncology and hematology care.  To afford each patient quality time with our providers, please arrive at least 15 minutes before your scheduled appointment.  You need to re-schedule your appointment if you arrive 10 or more minutes late.  We strive to give you quality time with our providers, and arriving late affects you and other patients whose appointments are after yours.  Also, if you no show three or more times for appointments you may be dismissed from the clinic.  Again, thank you for choosing [facility] at [facility]. Our hope is that these requests will allow you access to exceptional care and in a timely manner. _______________________________________________________________  If you have questions after your visit, please contact our office at [phone] between the hours of 8:30 a.m. and 5:00 p.m. Voicemails left after 4:30 p.m. will not be returned until the following business day. _______________________________________________________________  For prescription refill requests, have your pharmacy contact our office. _______________________________________________________________  Recommendations made by the consultant and any test results will be sent to your referring physician. _______________________________________________________________ 

## 2018-07-22 ENCOUNTER — Other Ambulatory Visit: Payer: Self-pay

## 2018-07-22 ENCOUNTER — Encounter (HOSPITAL_COMMUNITY)
Admission: RE | Admit: 2018-07-22 | Discharge: 2018-07-22 | Disposition: A | Discharge status: Home / Self Care | Payer: Medicare Other | Source: Ambulatory Visit | Attending: Hematology | Admitting: Hematology

## 2018-07-22 DIAGNOSIS — C3492 Malignant neoplasm of unspecified part of left bronchus or lung: Secondary | ICD-10-CM | POA: Diagnosis not present

## 2018-07-22 MED ORDER — FLUDEOXYGLUCOSE F - 18 (FDG) INJECTION
9.1300 | Freq: Once | INTRAVENOUS | Status: AC | PRN
  Administered 2018-07-22: 15:00:00 9.13 via INTRAVENOUS

## 2018-07-29 ENCOUNTER — Other Ambulatory Visit (HOSPITAL_COMMUNITY): Payer: Self-pay

## 2018-07-29 DIAGNOSIS — C3492 Malignant neoplasm of unspecified part of left bronchus or lung: Secondary | ICD-10-CM

## 2018-07-29 DIAGNOSIS — C109 Malignant neoplasm of oropharynx, unspecified: Secondary | ICD-10-CM

## 2018-07-30 ENCOUNTER — Inpatient Hospital Stay (HOSPITAL_COMMUNITY): Payer: Medicare Other | Attending: Hematology

## 2018-07-30 ENCOUNTER — Inpatient Hospital Stay (HOSPITAL_BASED_OUTPATIENT_CLINIC_OR_DEPARTMENT_OTHER): Payer: Medicare Other | Admitting: Hematology

## 2018-07-30 ENCOUNTER — Inpatient Hospital Stay (HOSPITAL_COMMUNITY): Payer: Medicare Other

## 2018-07-30 ENCOUNTER — Encounter (HOSPITAL_COMMUNITY): Payer: Self-pay | Admitting: Hematology

## 2018-07-30 ENCOUNTER — Other Ambulatory Visit: Payer: Self-pay

## 2018-07-30 ENCOUNTER — Other Ambulatory Visit (HOSPITAL_COMMUNITY): Payer: Self-pay | Admitting: *Deleted

## 2018-07-30 VITALS — BP 113/66 | HR 64 | Temp 98.1°F | Resp 18

## 2018-07-30 VITALS — BP 104/58 | HR 65 | Temp 98.0°F | Resp 16 | Wt 149.8 lb

## 2018-07-30 DIAGNOSIS — Z87891 Personal history of nicotine dependence: Secondary | ICD-10-CM | POA: Diagnosis not present

## 2018-07-30 DIAGNOSIS — Z5189 Encounter for other specified aftercare: Secondary | ICD-10-CM | POA: Insufficient documentation

## 2018-07-30 DIAGNOSIS — E039 Hypothyroidism, unspecified: Secondary | ICD-10-CM | POA: Insufficient documentation

## 2018-07-30 DIAGNOSIS — C109 Malignant neoplasm of oropharynx, unspecified: Secondary | ICD-10-CM | POA: Diagnosis not present

## 2018-07-30 DIAGNOSIS — C3492 Malignant neoplasm of unspecified part of left bronchus or lung: Secondary | ICD-10-CM | POA: Diagnosis not present

## 2018-07-30 DIAGNOSIS — Z5112 Encounter for antineoplastic immunotherapy: Secondary | ICD-10-CM | POA: Diagnosis present

## 2018-07-30 DIAGNOSIS — R972 Elevated prostate specific antigen [PSA]: Secondary | ICD-10-CM

## 2018-07-30 DIAGNOSIS — Z5111 Encounter for antineoplastic chemotherapy: Secondary | ICD-10-CM | POA: Insufficient documentation

## 2018-07-30 LAB — COMPREHENSIVE METABOLIC PANEL
ALT: 16 U/L (ref 0–44)
AST: 17 U/L (ref 15–41)
Albumin: 3.9 g/dL (ref 3.5–5.0)
Alkaline Phosphatase: 65 U/L (ref 38–126)
Anion gap: 9 (ref 5–15)
BUN: 15 mg/dL (ref 8–23)
CO2: 26 mmol/L (ref 22–32)
Calcium: 9.1 mg/dL (ref 8.9–10.3)
Chloride: 105 mmol/L (ref 98–111)
Creatinine, Ser: 1.89 mg/dL — ABNORMAL HIGH (ref 0.61–1.24)
GFR calc Af Amer: 41 mL/min — ABNORMAL LOW (ref >60)
GFR calc non Af Amer: 36 mL/min — ABNORMAL LOW (ref >60)
Glucose, Bld: 130 mg/dL — ABNORMAL HIGH (ref 70–99)
Potassium: 4.1 mmol/L (ref 3.5–5.1)
Sodium: 140 mmol/L (ref 135–145)
Total Bilirubin: 0.8 mg/dL (ref 0.3–1.2)
Total Protein: 7.1 g/dL (ref 6.5–8.1)

## 2018-07-30 LAB — CBC WITH DIFFERENTIAL/PLATELET
Abs Immature Granulocytes: 0.01 10*3/uL (ref 0.00–0.07)
Basophils Absolute: 0 10*3/uL (ref 0.0–0.1)
Basophils Relative: 1 %
Eosinophils Absolute: 0.1 10*3/uL (ref 0.0–0.5)
Eosinophils Relative: 2 %
HCT: 33.9 % — ABNORMAL LOW (ref 39.0–52.0)
Hemoglobin: 11 g/dL — ABNORMAL LOW (ref 13.0–17.0)
Immature Granulocytes: 0 %
Lymphocytes Relative: 25 %
Lymphs Abs: 1 10*3/uL (ref 0.7–4.0)
MCH: 30.8 pg (ref 26.0–34.0)
MCHC: 32.4 g/dL (ref 30.0–36.0)
MCV: 95 fL (ref 80.0–100.0)
Monocytes Absolute: 0.7 10*3/uL (ref 0.1–1.0)
Monocytes Relative: 17 %
Neutro Abs: 2.1 10*3/uL (ref 1.7–7.7)
Neutrophils Relative %: 55 %
Platelets: 167 10*3/uL (ref 150–400)
RBC: 3.57 MIL/uL — ABNORMAL LOW (ref 4.22–5.81)
RDW: 18.8 % — ABNORMAL HIGH (ref 11.5–15.5)
WBC: 3.9 10*3/uL — ABNORMAL LOW (ref 4.0–10.5)
nRBC: 0 % (ref 0.0–0.2)

## 2018-07-30 MED ORDER — DIPHENHYDRAMINE HCL 50 MG/ML IJ SOLN
50.0000 mg | Freq: Once | INTRAMUSCULAR | Status: AC
  Administered 2018-07-30: 50 mg via INTRAVENOUS
  Filled 2018-07-30: qty 1

## 2018-07-30 MED ORDER — SODIUM CHLORIDE 0.9 % IV SOLN
Freq: Once | INTRAVENOUS | Status: AC
  Administered 2018-07-30: 10:00:00 via INTRAVENOUS

## 2018-07-30 MED ORDER — SODIUM CHLORIDE 0.9 % IV SOLN
175.0000 mg/m2 | Freq: Once | INTRAVENOUS | Status: AC
  Administered 2018-07-30: 330 mg via INTRAVENOUS
  Filled 2018-07-30: qty 55

## 2018-07-30 MED ORDER — HEPARIN SOD (PORK) LOCK FLUSH 100 UNIT/ML IV SOLN
500.0000 [IU] | Freq: Once | INTRAVENOUS | Status: AC | PRN
  Administered 2018-07-30: 500 [IU]

## 2018-07-30 MED ORDER — SODIUM CHLORIDE 0.9% FLUSH
10.0000 mL | INTRAVENOUS | Status: DC | PRN
  Administered 2018-07-30: 10 mL
  Filled 2018-07-30: qty 10

## 2018-07-30 MED ORDER — SODIUM CHLORIDE 0.9 % IV SOLN
307.5000 mg | Freq: Once | INTRAVENOUS | Status: AC
  Administered 2018-07-30: 310 mg via INTRAVENOUS
  Filled 2018-07-30: qty 31

## 2018-07-30 MED ORDER — SODIUM CHLORIDE 0.9 % IV SOLN
20.0000 mg | Freq: Once | INTRAVENOUS | Status: AC
  Administered 2018-07-30: 20 mg via INTRAVENOUS
  Filled 2018-07-30: qty 20

## 2018-07-30 MED ORDER — PALONOSETRON HCL INJECTION 0.25 MG/5ML
0.2500 mg | Freq: Once | INTRAVENOUS | Status: AC
  Administered 2018-07-30: 0.25 mg via INTRAVENOUS
  Filled 2018-07-30: qty 5

## 2018-07-30 MED ORDER — FAMOTIDINE IN NACL 20-0.9 MG/50ML-% IV SOLN
20.0000 mg | Freq: Once | INTRAVENOUS | Status: AC
  Administered 2018-07-30: 20 mg via INTRAVENOUS
  Filled 2018-07-30: qty 50

## 2018-07-30 NOTE — Patient Instructions (Addendum)
[facility] at [facility] Discharge Instructions  You were seen today by Dr. Delton Coombes. He went over your recent scan results which showed good response to treatment. He will see you back in 3 weeks for labs, treatment,  and follow up.   Thank you for choosing [facility] at [facility] to provide your oncology and hematology care.  To afford each patient quality time with our provider, please arrive at least 15 minutes before your scheduled appointment time.   If you have a lab appointment with the [facility] please come in thru the  Main Entrance and check in at the main information desk  You need to re-schedule your appointment should you arrive 10 or more minutes late.  We strive to give you quality time with our providers, and arriving late affects you and other patients whose appointments are after yours.  Also, if you no show three or more times for appointments you may be dismissed from the clinic at the providers discretion.     Again, thank you for choosing [facility].  Our hope is that these requests will decrease the amount of time that you wait before being seen by our physicians.       _____________________________________________________________  Should you have questions after your visit to [facility], please contact our office at [phone] between the hours of 8:00 a.m. and 4:30 p.m.  Voicemails left after 4:00 p.m. will not be returned until the following business day.  For prescription refill requests, have your pharmacy contact our office and allow 72 hours.    Cancer Center Support Programs:   > Cancer Support Group  2nd Tuesday of the month 1pm-2pm, [facility] Room

## 2018-07-30 NOTE — Assessment & Plan Note (Addendum)
1.  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  Persistent hypermetabolic activity in the right prostate gland. -PDL 1 testing was not done.  Foundation 1 testing shows MS-stable, TMB-59mts/mb, NOTCH 1 Loss, PTEN loss, RB1 loss, FAS loss - 4 cycles of carboplatin, paclitaxel and pembrolizumab from 05/03/2018 through 07/09/2018.  - PET CT scan on 07/22/2018 shows reduced size and reduced metabolic activity in the thoracic adenopathy, left lower lobe pulmonary nodules.  Stable enlarged indistinctly marginated left level 2B lymph node with SUV of 1.9.  There is increased tongue activity which is of uncertain significance.  Hypermetabolic activity in the prostate gland eccentric to the right side. - Alexander [name] is tolerating chemotherapy very well without any major side effects.  I have recommended 2 more cycles of chemoimmunotherapy followed by maintenance with pembrolizumab.  Alexander [name] is agreeable to the plan. - Alexander [name] will proceed with cycle 5 of chemotherapy today.  Alexander [name] will use hydrocodone as needed for growth factor induced pain.  2.  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  - PET scan did not show any hypermetabolic activity in the neck.  3.  Hypothyroidism: -His TSH was 6.6 on 06/14/2018.  Prior TSH was 17.5. -Alexander [name] is not on any thyroid supplements.  We will continue to monitor it closely.  4.  Elevated PSA: -PSA was elevated at 39.6 on 05/24/2018.  We will plan to repeat it. -PET scan showed increased uptake in the right side of the prostate.   -We will plan to repeat it in next few weeks.

## 2018-07-30 NOTE — Progress Notes (Signed)
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    07/28/2014 Initial Diagnosis    Oropharyngeal cancer    08/03/2014 Imaging    CT CAP- L supraclavicular lymphadenopathy is not completely visualized. This is better seen on the previous neck CT from 07/27/2014. Otherwise, no evidence for metastatic disease in the chest, abdomen, or pelvis.    08/03/2014 Imaging    Bone scan- Uptake at adjacent anterior LEFT 6, 7, 8 ribs likely representing trauma/fractures. Questionable nonspecific increased tracer localization at the posterior RIGHT 8th and 9th ribs, the adjacent nature which raises a a question of trauma as well    08/06/2014 Pathology Results    Dr. Benjamine Mola- Oropharynx, biopsy, Left - INVASIVE SQUAMOUS CELL CARCINOMA.    08/12/2014 Procedure    Dr. Enrique Sack- 1. Multiple extraction of tooth numbers 6, 17, 22, 23, 24, 25, 26, and 27. 3 Quadrants of alveoloplasty    08/17/2014 Pathology Results    PORT and G-TUBE placed by Dr. Carlis Stable.    08/26/2014 PET scan    Large hypermetabolic mass in the left base of tongue. Activity extends across midline to the right base tongue. 2. Intensely hypermetabolic left cervical metastatic lymph nodes. Lymph nodes extend from the left level II position to the left supraclavi    09/01/2014 - 09/22/2014 Chemotherapy    Concurrent chemoradiation with Cisplatin 100 mg/m2 x 2 cycles with Neulasta support. Held cycle #3 d/t renal toxicity.     09/03/2014 - 10/23/2014 Radiation Therapy    Treated in [place], IMRT Isidore Moos).  Base of tongue and bilat neck. Total dose: 70 Gy in 35  fractions. (of note, Alexander [name] did miss several treatments requiring BID dosing towards the end of treatment).     01/25/2015 PET scan    Near complete resolution of metabolic activity at the base of tongue. Minimal residual activity is likely post treatment effect. 2. Complete resolution of metabolic activity above LEFT cervical lymph nodes. No evidence of residual metabolically active     0/05/4915 Procedure    Port-a-cath removed [name])     05/03/2018 -  Chemotherapy    The patient had palonosetron (ALOXI) injection 0.25 mg, 0.25 mg, Intravenous,  Once, 5 of 6 cycles Administration: 0.25 mg (05/03/2018), 0.25 mg (05/24/2018), 0.25 mg (06/14/2018), 0.25 mg (07/09/2018) pegfilgrastim-cbqv (UDENYCA) injection 6 mg, 6 mg, Subcutaneous, Once, 4 of 5 cycles Administration: 6 mg (05/27/2018), 6 mg (06/17/2018), 6 mg (07/11/2018) CARBOplatin (PARAPLATIN) 380 mg in sodium chloride 0.9 % 250 mL chemo infusion, 380 mg (100 % of original dose 381 mg), Intravenous,  Once, 5 of 6 cycles Dose modification:   (original dose 381 mg, Cycle 1),   (original dose 309.5 mg, Cycle 2), 307.5 mg (original dose 309.5 mg, Cycle 5) Administration: 380 mg (05/03/2018), 310 mg (05/24/2018), 310 mg (06/14/2018), 340 mg (07/09/2018) PACLitaxel (TAXOL) 330 mg in sodium chloride 0.9 % 500 mL chemo infusion (> 66m/m2), 175 mg/m2 = 330 mg (100 % of original dose 175 mg/m2), Intravenous,  Once, 5 of 6 cycles Dose modification: 175 mg/m2 (original dose 175 mg/m2, Cycle 1, Reason: Patient Age)  Administration: 330 mg (05/03/2018), 330 mg (05/24/2018), 330 mg (06/14/2018), 330 mg (07/09/2018)  for chemotherapy treatment.     05/24/2018 -  Chemotherapy    The patient had pembrolizumab (KEYTRUDA) 200 mg in sodium chloride 0.9 % 50 mL chemo infusion, 200 mg, Intravenous, Once, 3 of 6 cycles Administration: 200 mg (05/24/2018), 200 mg (06/14/2018), 200 mg (07/09/2018)  for chemotherapy treatment.       CANCER STAGING: Cancer Staging Oropharyngeal  carcinoma ([place]) Staging form: Pharynx - Oropharynx, AJCC 7th Edition - Clinical: Stage IVA (T4a, N2b, M0) - Unsigned    INTERVAL HISTORY:  Alexander [name] 68 y.o. male returns for routine follow-up and consideration for next cycle of chemotherapy. Alexander [name] is here today alone. Alexander [name] reports overall doing well. Alexander [name] is tolerating treatment exceptionally well.  Denies any nausea, vomiting, or diarrhea. Denies any new pains. Had not noticed any recent bleeding such as epistaxis, hematuria or hematochezia. Denies recent chest pain on exertion, shortness of breath on minimal exertion, pre-syncopal episodes, or palpitations. Denies any numbness or tingling in hands or feet. Denies any recent fevers, infections, or recent hospitalizations. Patient reports appetite at 75% and energy level at 100%.     REVIEW OF SYSTEMS:  Review of Systems  Constitutional: Negative.   HENT:  Negative.   Eyes: Negative.   Respiratory: Negative.   Cardiovascular: Negative.   Gastrointestinal: Negative.   Endocrine: Negative.   Genitourinary: Negative.    Musculoskeletal: Negative.   Skin: Negative.   Neurological: Negative.   Hematological: Negative.   Psychiatric/Behavioral: Negative.      PAST MEDICAL/SURGICAL HISTORY:  Past Medical History:  Diagnosis Date  . GERD (gastroesophageal reflux disease)   . 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 3 weeks ago.- Dr. Oneal [facility] [place], [place].  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 BIOPSY  01/15/2018   Procedure: BIOPSY;  Surgeon: Danie Binder, MD;  [facility];  Service: Endoscopy;;  gastric  . COLONOSCOPY N/A 03/13/2016   Procedure: COLONOSCOPY;  Surgeon: Danie Binder, MD;  [facility];  Service: Endoscopy;  Laterality: N/A;  2:15 PM  .  ESOPHAGOGASTRODUODENOSCOPY (EGD) WITH PROPOFOL N/A 08/17/2014   Procedure: ESOPHAGOGASTRODUODENOSCOPY (EGD) WITH PROPOFOL (procedure #1);  Surgeon: Aviva Signs Md, MD;  Location: [facility];  Service: General;  Laterality: N/A;  . ESOPHAGOGASTRODUODENOSCOPY (EGD) WITH PROPOFOL N/A 01/15/2018   Procedure: ESOPHAGOGASTRODUODENOSCOPY (EGD) WITH PROPOFOL;  Surgeon: Danie Binder, MD;  [facility];  Service: Endoscopy;  Laterality: N/A;  9:30am  . MULTIPLE EXTRACTIONS WITH ALVEOLOPLASTY N/A 08/12/2014   Procedure: Extraction of tooth #'s 6,17,22,23,24,25,26,27 with alveoloplasty;  Surgeon: Lenn Cal, DDS;  Location: [facility];  Service: Oral Surgery;  Laterality: N/A;  . PANENDOSCOPY N/A 08/06/2014   Procedure: PANENDOSCOPY WITH BIOPSY;  Surgeon: Leta Baptist, MD;  Location: [facility];  Service: ENT;  Laterality: N/A;  . PEG PLACEMENT Left 08/17/14  . PEG PLACEMENT N/A 08/17/2014   Procedure: PERCUTANEOUS ENDOSCOPIC GASTROSTOMY (PEG) PLACEMENT (procedure #1);  Surgeon: Aviva Signs Md, MD;  Location: [facility];  Service: General;  Laterality: N/A;  . PORT-A-CATH REMOVAL Right 07/17/2016   Procedure: MINOR REMOVAL PORT-A-CATH;  Surgeon: Aviva Signs, MD;  Location[facility];  Service: General;  Laterality: Right;  . PORTACATH PLACEMENT  Right 08/17/14  . PORTACATH PLACEMENT Right 08/17/2014   Procedure: INSERTION PORT-A-CATH (procedure #2);  Surgeon: Aviva Signs Md, MD;  Location[facility];  Service: General;  Laterality: Right;  . PORTACATH PLACEMENT Left 04/26/2018   Procedure: INSERTION PORT-A-CATH (attached catheter in left subclavian);  Surgeon: Aviva Signs, MD;  Location: [facility];  Service: General;  Laterality: Left;  . SAVORY DILATION N/A 01/15/2018   Procedure: SAVORY DILATION;  Surgeon: Danie Binder, MD;  [facility];  Service: Endoscopy;  Laterality: N/A;  . VIDEO BRONCHOSCOPY WITH ENDOBRONCHIAL ULTRASOUND N/A 04/15/2018   Procedure: VIDEO BRONCHOSCOPY WITH  ENDOBRONCHIAL ULTRASOUND;  Surgeon: Melrose Nakayama, MD;  Location: [facility];  Service: Thoracic;  Laterality: N/A;     SOCIAL HISTORY:  Social History   Socioeconomic History  . Marital status: Legally Separated    Spouse name: Not on file  . Number of children: 5  . Years of education: Not on file  . Highest education level: Not on file  Occupational History  . Not on file  Social Needs  . Financial resource strain: Not on file  . Food insecurity:    Worry: Not on file    Inability: Not on file  . Transportation needs:    Medical: Not on file    Non-medical: Not on file  Tobacco Use  . Smoking status: Former Smoker    Packs/day: 0.50    Years: 30.00    Pack years: 15.00    Last attempt to quit: 07/22/2014    Years since quitting: 4.0  . Smokeless tobacco: Never Used  Substance and Sexual Activity  . Alcohol use: Not Currently    Alcohol/week: 0.0 standard drinks    Comment: None currently (11/09/17); previously 1-2 beers on the weekend  . Drug use: No  . Sexual activity: Not on file  Lifestyle  . Physical activity:    Days per week: Not on file    Minutes per session: Not on file  . Stress: Not on file  Relationships  . Social connections:    Talks on phone: Not on file    Gets together: Not on file    Attends religious service: Not on file    Active member of club or organization: Not on file    Attends meetings of clubs or organizations: Not on file    Relationship status: Not on file  . Intimate partner violence:    Fear of current or ex partner: Not on file    Emotionally abused: Not on file    Physically abused: Not on file    Forced sexual activity: Not on file  Other Topics Concern  . Not on file  Social History Narrative  . Not on file    FAMILY HISTORY:  Family History  Problem Relation Age of Onset  . Colon cancer Neg Hx   . Gastric cancer Neg Hx   . Esophageal cancer Neg Hx     CURRENT MEDICATIONS:  Outpatient Encounter Medications  as of 07/30/2018  Medication Sig  . CARBOPLATIN IV Inject into the vein every 21 ( twenty-one) days.  Marland Kitchen dexamethasone (DECADRON) 4 MG tablet Take 2 tablets (8 mg total) by mouth daily. Start the day after chemotherapy for 2 days.  . feeding supplement, ENSURE ENLIVE, (ENSURE ENLIVE) LIQD Take 237 mLs by mouth 2 (two) times daily.  Marland Kitchen HYDROcodone-acetaminophen (NORCO/VICODIN) 5-325 MG tablet Take 1 tablet by mouth every 8 (eight) hours as needed for moderate pain.  Marland Kitchen  levothyroxine (SYNTHROID, LEVOTHROID) 50 MCG tablet TAKE 1 TABLET BY MOUTH DAILY BEFORE BREAKAST.  Marland Kitchen lidocaine-prilocaine (EMLA) cream Apply to affected area once (Patient taking differently: Apply to port a cath site and cover with plastic wrap one hour prior to appointment)  . LORazepam (ATIVAN) 0.5 MG tablet Take 1 tablet (0.5 mg total) by mouth every 6 (six) hours as needed (Nausea or vomiting).  Marland Kitchen omeprazole (PRILOSEC) 20 MG capsule 1 PO 30 MINS PRIOR TO BREAKFAST. (Patient taking differently: Take 20 mg by mouth daily before breakfast. 1 PO 30 MINS PRIOR TO BREAKFAST.)  . ondansetron (ZOFRAN) 8 MG tablet Take 1 tablet (8 mg total) by mouth 2 (two) times daily as needed for refractory nausea / vomiting. Start on day 3 after chemo.  Marland Kitchen PACLitaxel (TAXOL IV) Inject into the vein every 21 ( twenty-one) days.  . Pembrolizumab (KEYTRUDA IV) Inject into the vein every 21 ( twenty-one) days.  . prochlorperazine (COMPAZINE) 10 MG tablet Take 1 tablet (10 mg total) by mouth every 6 (six) hours as needed (Nausea or vomiting).   No facility-administered encounter medications on file as of 07/30/2018.     ALLERGIES:  No Known Allergies   PHYSICAL EXAM:  ECOG Performance status: 1  Vitals:   07/30/18 0819 07/30/18 0837  BP: (!) 89/53 (!) 104/58  Pulse: 65   Resp: 16   Temp: 98 F (36.7 C)   SpO2: 100%    Filed Weights   07/30/18 0819  Weight: 149 lb 12.8 oz (67.9 kg)    Physical Exam Vitals signs reviewed.  Constitutional:       Appearance: Normal appearance.  Cardiovascular:     Rate and Rhythm: Normal rate and regular rhythm.     Heart sounds: Normal heart sounds.  Pulmonary:     Effort: Pulmonary effort is normal.     Breath sounds: Normal breath sounds.  Abdominal:     General: Bowel sounds are normal.     Palpations: Abdomen is soft.  Musculoskeletal:        General: No swelling.  Skin:    General: Skin is warm and dry.  Neurological:     Mental Status: Alexander [name] is alert and oriented to person, place, and time.  Psychiatric:        Mood and Affect: Mood normal.        Behavior: Behavior normal.      LABORATORY DATA:  I have reviewed the labs as listed.  CBC    Component Value Date/Time   WBC 3.9 (L) 07/30/2018 0809   RBC 3.57 (L) 07/30/2018 0809   HGB 11.0 (L) 07/30/2018 0809   HCT 33.9 (L) 07/30/2018 0809   PLT 167 07/30/2018 0809   MCV 95.0 07/30/2018 0809   MCH 30.8 07/30/2018 0809   MCHC 32.4 07/30/2018 0809   RDW 18.8 (H) 07/30/2018 0809   LYMPHSABS 1.0 07/30/2018 0809   MONOABS 0.7 07/30/2018 0809   EOSABS 0.1 07/30/2018 0809   BASOSABS 0.0 07/30/2018 0809   CMP Latest Ref Rng & Units 07/30/2018 07/09/2018 06/14/2018  Glucose 70 - 99 mg/dL 130(H) 134(H) 127(H)  BUN 8 - 23 mg/dL 15 22 23   Creatinine 0.61 - 1.24 mg/dL 1.89(H) 1.64(H) 1.85(H)  Sodium 135 - 145 mmol/L 140 139 136  Potassium 3.5 - 5.1 mmol/L 4.1 3.7 4.3  Chloride 98 - 111 mmol/L 105 105 101  CO2 22 - 32 mmol/L 26 24 25   Calcium 8.9 - 10.3 mg/dL 9.1 9.1 9.5  Total Protein 6.5 -  8.1 g/dL 7.1 7.3 7.4  Total Bilirubin 0.3 - 1.2 mg/dL 0.8 0.6 0.5  Alkaline Phos 38 - 126 U/L 65 64 68  AST 15 - 41 U/L 17 20 18   ALT 0 - 44 U/L 16 16 18        DIAGNOSTIC IMAGING:  I have independently reviewed the scans and discussed with the patient.   I have reviewed Venita Lick LPN's note and agree with the documentation.  I personally performed a face-to-face visit, made revisions and my assessment and plan is as follows.     ASSESSMENT & PLAN:   Squamous cell lung cancer, left ([place]) 1.  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  Persistent hypermetabolic activity in the right prostate gland. -PDL 1 testing was not done.  Foundation 1 testing shows MS-stable, TMB-87mts/mb, NOTCH 1 Loss, PTEN loss, RB1 loss, FAS loss - 4 cycles of carboplatin, paclitaxel and pembrolizumab from 05/03/2018 through 07/09/2018.  - PET CT scan on 07/22/2018 shows reduced size and reduced metabolic activity in the thoracic adenopathy, left lower lobe pulmonary nodules.  Stable enlarged indistinctly marginated left level 2B lymph node with SUV of 1.9.  There is increased tongue activity which is of uncertain significance.  Hypermetabolic activity in the prostate gland eccentric to the right side. - Alexander [name] is tolerating chemotherapy very well without any major side effects.  I have recommended 2 more cycles of chemoimmunotherapy followed by maintenance with pembrolizumab.  Alexander [name] is agreeable to the plan. - Alexander [name] will proceed with cycle 5 of chemotherapy today.  Alexander [name] will use hydrocodone as needed for growth factor induced pain.  2.  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  - PET scan did not show any hypermetabolic activity in the neck.  3.  Hypothyroidism: -Alexander [name] TSH was 6.6 on 06/14/2018.  Prior TSH was 17.5. -Alexander [name] is not on any thyroid supplements.  We will continue to monitor it closely.  4.  Elevated PSA: -PSA was elevated at 39.6 on 05/24/2018.  We will plan to repeat it. -PET scan showed increased uptake in the right side of the prostate.   -We will plan to repeat it in next few weeks.  Total time spent is 40 minutes with more than 50% of the  time spent face-to-face discussing scan results, treatment plan, side effects and coordination of care.    Orders placed this encounter:  Orders Placed This Encounter  Procedures  . CBC with Differential  . Comprehensive metabolic panel  . TSH  . T4  . PSA      SDerek Jack MD [phone]

## 2018-07-30 NOTE — Progress Notes (Signed)
930 CBCD and CMET results reviewed with and pt seen by Dr. Galvin Proffer and pt approved for chemo tx with Taxol and Carboplatin today per MD                         Sabino Donovan tolerated chemo tx well without complaints or incident. VSS upon discharge. Pt discharged self ambulatory in satisfactory condition

## 2018-07-30 NOTE — Patient Instructions (Signed)
[facility] Discharge Instructions for Patients Receiving Chemotherapy   Beginning January 23rd 2017 lab work for the Cancer Center will be done in the  Main lab at [facility] on 1st floor. If you have a lab appointment with the Cancer Center please come in thru the  Main Entrance and check in at the main information desk   Today you received the following chemotherapy agents Taxol and Carboplatin. Follow-up as scheduled. Call clinic for any questions or concerns  To help prevent nausea and vomiting after your treatment, we encourage you to take your nausea medication.   If you develop nausea and vomiting, or diarrhea that is not controlled by your medication, call the clinic.  The clinic phone number is [phone]. Office hours are Monday-Friday 8:30am-5:00pm.  BELOW ARE SYMPTOMS THAT SHOULD BE REPORTED IMMEDIATELY:  *FEVER GREATER THAN 101.0 F  *CHILLS WITH OR WITHOUT FEVER  NAUSEA AND VOMITING THAT IS NOT CONTROLLED WITH YOUR NAUSEA MEDICATION  *UNUSUAL SHORTNESS OF BREATH  *UNUSUAL BRUISING OR BLEEDING  TENDERNESS IN MOUTH AND THROAT WITH OR WITHOUT PRESENCE OF ULCERS  *URINARY PROBLEMS  *BOWEL PROBLEMS  UNUSUAL RASH Items with * indicate a potential emergency and should be followed up as soon as possible. If you have an emergency after office hours please contact your primary care physician or go to the nearest emergency department.  Please call the clinic during office hours if you have any questions or concerns.   You may also contact the Patient Navigator at [phone] should you have any questions or need assistance in obtaining follow up care.      Resources For Cancer Patients and their Caregivers ? American Cancer Society: Can assist with transportation, wigs, general needs, runs Look Good Feel Better.        1-888-227-6333 ? Cancer Care: Provides financial assistance, online support groups, medication/co-pay assistance.   1-800-813-HOPE (4673) ? [facility] Assists [place] cancer patients and their families through emotional , educational and financial support.  336-427-4357 ? [place] DSS Where to apply for food stamps, Medicaid and utility assistance. 336-342-1394 ? [facility]: Transportation to medical appointments. 336-347-2287 ? Social Security Administration: May apply for disability if have a Stage IV cancer. 336-342-7796 1-800-772-1213 ? [place] Aging, Disability and Transit Services: Assists with nutrition, care and transit needs. 336-349-2343         

## 2018-07-31 MED ORDER — HYDROCODONE-ACETAMINOPHEN 5-325 MG PO TABS: 1.0000 | ORAL_TABLET | Freq: Three times a day (TID) | ORAL | 0 refills | Status: DC | PRN

## 2018-08-01 ENCOUNTER — Encounter (HOSPITAL_COMMUNITY): Payer: Self-pay

## 2018-08-01 ENCOUNTER — Inpatient Hospital Stay (HOSPITAL_COMMUNITY): Payer: Medicare Other

## 2018-08-01 ENCOUNTER — Other Ambulatory Visit: Payer: Self-pay

## 2018-08-01 VITALS — BP 111/65 | HR 89 | Temp 97.6°F | Resp 18

## 2018-08-01 DIAGNOSIS — Z5112 Encounter for antineoplastic immunotherapy: Secondary | ICD-10-CM | POA: Diagnosis not present

## 2018-08-01 DIAGNOSIS — C109 Malignant neoplasm of oropharynx, unspecified: Secondary | ICD-10-CM

## 2018-08-01 MED ORDER — PEGFILGRASTIM-CBQV 6 MG/0.6ML ~~LOC~~ SOSY
6.0000 mg | PREFILLED_SYRINGE | Freq: Once | SUBCUTANEOUS | Status: AC
  Administered 2018-08-01: 6 mg via SUBCUTANEOUS

## 2018-08-01 MED ORDER — PEGFILGRASTIM-CBQV 6 MG/0.6ML ~~LOC~~ SOSY
PREFILLED_SYRINGE | SUBCUTANEOUS | Status: AC
  Filled 2018-08-01: qty 0.6

## 2018-08-01 NOTE — Patient Instructions (Signed)
[facility]  Discharge Instructions:   _______________________________________________________________  Thank you for choosing [facility] at [facility] to provide your oncology and hematology care.  To afford each patient quality time with our providers, please arrive at least 15 minutes before your scheduled appointment.  You need to re-schedule your appointment if you arrive 10 or more minutes late.  We strive to give you quality time with our providers, and arriving late affects you and other patients whose appointments are after yours.  Also, if you no show three or more times for appointments you may be dismissed from the clinic.  Again, thank you for choosing  Cancer Center at [facility]. Our hope is that these requests will allow you access to exceptional care and in a timely manner. _______________________________________________________________  If you have questions after your visit, please contact our office at [phone] between the hours of 8:30 a.m. and 5:00 p.m. Voicemails left after 4:30 p.m. will not be returned until the following business day. _______________________________________________________________  For prescription refill requests, have your pharmacy contact our office. _______________________________________________________________  Recommendations made by the consultant and any test results will be sent to your referring physician. _______________________________________________________________ 

## 2018-08-01 NOTE — Progress Notes (Signed)
Udenyca given per orders. Patient tolerated it well without problems. Vitals stable and discharged home from clinic ambulatory. Follow up as scheduled.

## 2018-08-21 ENCOUNTER — Inpatient Hospital Stay (HOSPITAL_COMMUNITY): Payer: Medicare Other

## 2018-08-21 ENCOUNTER — Inpatient Hospital Stay (HOSPITAL_BASED_OUTPATIENT_CLINIC_OR_DEPARTMENT_OTHER): Payer: Medicare Other | Admitting: Hematology

## 2018-08-21 ENCOUNTER — Encounter (HOSPITAL_COMMUNITY): Payer: Self-pay | Admitting: Hematology

## 2018-08-21 ENCOUNTER — Encounter (HOSPITAL_COMMUNITY): Payer: Self-pay

## 2018-08-21 ENCOUNTER — Other Ambulatory Visit: Payer: Self-pay

## 2018-08-21 VITALS — BP 131/69 | HR 57 | Temp 97.6°F | Resp 18

## 2018-08-21 DIAGNOSIS — Z87891 Personal history of nicotine dependence: Secondary | ICD-10-CM

## 2018-08-21 DIAGNOSIS — C109 Malignant neoplasm of oropharynx, unspecified: Secondary | ICD-10-CM

## 2018-08-21 DIAGNOSIS — C3492 Malignant neoplasm of unspecified part of left bronchus or lung: Secondary | ICD-10-CM

## 2018-08-21 DIAGNOSIS — Z5112 Encounter for antineoplastic immunotherapy: Secondary | ICD-10-CM | POA: Diagnosis not present

## 2018-08-21 LAB — COMPREHENSIVE METABOLIC PANEL
ALT: 11 U/L (ref 0–44)
AST: 15 U/L (ref 15–41)
Albumin: 3.5 g/dL (ref 3.5–5.0)
Alkaline Phosphatase: 65 U/L (ref 38–126)
Anion gap: 12 (ref 5–15)
BUN: 16 mg/dL (ref 8–23)
CO2: 24 mmol/L (ref 22–32)
Calcium: 9 mg/dL (ref 8.9–10.3)
Chloride: 105 mmol/L (ref 98–111)
Creatinine, Ser: 1.51 mg/dL — ABNORMAL HIGH (ref 0.61–1.24)
GFR calc Af Amer: 54 mL/min — ABNORMAL LOW (ref >60)
GFR calc non Af Amer: 47 mL/min — ABNORMAL LOW (ref >60)
Glucose, Bld: 109 mg/dL — ABNORMAL HIGH (ref 70–99)
Potassium: 4.1 mmol/L (ref 3.5–5.1)
Sodium: 141 mmol/L (ref 135–145)
Total Bilirubin: 0.4 mg/dL (ref 0.3–1.2)
Total Protein: 7 g/dL (ref 6.5–8.1)

## 2018-08-21 LAB — CBC WITH DIFFERENTIAL/PLATELET
Abs Immature Granulocytes: 0.01 10*3/uL (ref 0.00–0.07)
Basophils Absolute: 0 10*3/uL (ref 0.0–0.1)
Basophils Relative: 0 %
Eosinophils Absolute: 0.1 10*3/uL (ref 0.0–0.5)
Eosinophils Relative: 1 %
HCT: 30.9 % — ABNORMAL LOW (ref 39.0–52.0)
Hemoglobin: 9.8 g/dL — ABNORMAL LOW (ref 13.0–17.0)
Immature Granulocytes: 0 %
Lymphocytes Relative: 26 %
Lymphs Abs: 0.9 10*3/uL (ref 0.7–4.0)
MCH: 30.8 pg (ref 26.0–34.0)
MCHC: 31.7 g/dL (ref 30.0–36.0)
MCV: 97.2 fL (ref 80.0–100.0)
Monocytes Absolute: 0.5 10*3/uL (ref 0.1–1.0)
Monocytes Relative: 15 %
Neutro Abs: 2.1 10*3/uL (ref 1.7–7.7)
Neutrophils Relative %: 58 %
Platelets: 271 10*3/uL (ref 150–400)
RBC: 3.18 MIL/uL — ABNORMAL LOW (ref 4.22–5.81)
RDW: 17.9 % — ABNORMAL HIGH (ref 11.5–15.5)
WBC: 3.6 10*3/uL — ABNORMAL LOW (ref 4.0–10.5)
nRBC: 0 % (ref 0.0–0.2)

## 2018-08-21 MED ORDER — SODIUM CHLORIDE 0.9 % IV SOLN
200.0000 mg | Freq: Once | INTRAVENOUS | Status: AC
  Administered 2018-08-21: 200 mg via INTRAVENOUS
  Filled 2018-08-21: qty 8

## 2018-08-21 MED ORDER — HEPARIN SOD (PORK) LOCK FLUSH 100 UNIT/ML IV SOLN
500.0000 [IU] | Freq: Once | INTRAVENOUS | Status: AC | PRN
  Administered 2018-08-21: 500 [IU]

## 2018-08-21 MED ORDER — SODIUM CHLORIDE 0.9 % IV SOLN
20.0000 mg | Freq: Once | INTRAVENOUS | Status: AC
  Administered 2018-08-21: 20 mg via INTRAVENOUS
  Filled 2018-08-21: qty 2

## 2018-08-21 MED ORDER — SODIUM CHLORIDE 0.9% FLUSH
10.0000 mL | INTRAVENOUS | Status: DC | PRN
  Administered 2018-08-21: 10 mL
  Filled 2018-08-21: qty 10

## 2018-08-21 MED ORDER — SODIUM CHLORIDE 0.9 % IV SOLN
353.5000 mg | Freq: Once | INTRAVENOUS | Status: AC
  Administered 2018-08-21: 350 mg via INTRAVENOUS
  Filled 2018-08-21: qty 35

## 2018-08-21 MED ORDER — SODIUM CHLORIDE 0.9 % IV SOLN
175.0000 mg/m2 | Freq: Once | INTRAVENOUS | Status: AC
  Administered 2018-08-21: 330 mg via INTRAVENOUS
  Filled 2018-08-21: qty 55

## 2018-08-21 MED ORDER — SODIUM CHLORIDE 0.9 % IV SOLN
Freq: Once | INTRAVENOUS | Status: AC
  Administered 2018-08-21: 09:00:00 via INTRAVENOUS

## 2018-08-21 MED ORDER — DIPHENHYDRAMINE HCL 50 MG/ML IJ SOLN
50.0000 mg | Freq: Once | INTRAMUSCULAR | Status: AC
  Administered 2018-08-21: 50 mg via INTRAVENOUS
  Filled 2018-08-21: qty 1

## 2018-08-21 MED ORDER — FAMOTIDINE IN NACL 20-0.9 MG/50ML-% IV SOLN
20.0000 mg | Freq: Once | INTRAVENOUS | Status: AC
  Administered 2018-08-21: 20 mg via INTRAVENOUS
  Filled 2018-08-21: qty 50

## 2018-08-21 MED ORDER — HYDROCODONE-ACETAMINOPHEN 5-325 MG PO TABS: 1.0000 | ORAL_TABLET | Freq: Three times a day (TID) | ORAL | 0 refills | Status: DC | PRN

## 2018-08-21 MED ORDER — PALONOSETRON HCL INJECTION 0.25 MG/5ML
0.2500 mg | Freq: Once | INTRAVENOUS | Status: AC
  Administered 2018-08-21: 0.25 mg via INTRAVENOUS
  Filled 2018-08-21: qty 5

## 2018-08-21 NOTE — Patient Instructions (Signed)
[facility] at [facility] Discharge Instructions  You were seen today by Dr. Katragadda. He went over your recent lab results. He will see you back in  for labs and follow up.   Thank you for choosing [facility] at [facility] to provide your oncology and hematology care.  To afford each patient quality time with our provider, please arrive at least 15 minutes before your scheduled appointment time.   If you have a lab appointment with the Cancer Center please come in thru the  Main Entrance and check in at the main information desk  You need to re-schedule your appointment should you arrive 10 or more minutes late.  We strive to give you quality time with our providers, and arriving late affects you and other patients whose appointments are after yours.  Also, if you no show three or more times for appointments you may be dismissed from the clinic at the providers discretion.     Again, thank you for choosing [facility].  Our hope is that these requests will decrease the amount of time that you wait before being seen by our physicians.       _____________________________________________________________  Should you have questions after your visit to [facility], please contact our office at [phone] between the hours of 8:00 a.m. and 4:30 p.m.  Voicemails left after 4:00 p.m. will not be returned until the following business day.  For prescription refill requests, have your pharmacy contact our office and allow 72 hours.    Cancer Center Support Programs:   > Cancer Support Group  2nd Tuesday of the month 1pm-2pm, Journey Room    

## 2018-08-21 NOTE — Progress Notes (Signed)
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    07/28/2014 Initial Diagnosis    Oropharyngeal cancer    08/03/2014 Imaging    CT CAP- L supraclavicular lymphadenopathy is not completely visualized. This is better seen on the previous neck CT from 07/27/2014. Otherwise, no evidence for metastatic disease in the chest, abdomen, or pelvis.    08/03/2014 Imaging    Bone scan- Uptake at adjacent anterior LEFT 6, 7, 8 ribs likely representing trauma/fractures. Questionable nonspecific increased tracer localization at the posterior RIGHT 8th and 9th ribs, the adjacent nature which raises a a question of trauma as well    08/06/2014 Pathology Results    Dr. Benjamine Mola- Oropharynx, biopsy, Left - INVASIVE SQUAMOUS CELL CARCINOMA.    08/12/2014 Procedure    Dr. Enrique Sack- 1. Multiple extraction of tooth numbers 6, 17, 22, 23, 24, 25, 26, and 27. 3 Quadrants of alveoloplasty    08/17/2014 Pathology Results    PORT and G-TUBE placed by Dr. Carlis Stable.    08/26/2014 PET scan    Large hypermetabolic mass in the left base of tongue. Activity extends across midline to the right base tongue. 2. Intensely hypermetabolic left cervical metastatic lymph nodes. Lymph nodes extend from the left level II position to the left supraclavi    09/01/2014 - 09/22/2014 Chemotherapy    Concurrent chemoradiation with Cisplatin 100 mg/m2 x 2 cycles with Neulasta support. Held cycle #3 d/t renal toxicity.     09/03/2014 - 10/23/2014 Radiation Therapy    Treated in [place], IMRT Isidore Moos).  Base of tongue and bilat neck. Total dose: 70 Gy in 35  fractions. (of note, he did miss several treatments requiring BID dosing towards the end of treatment).     01/25/2015 PET scan    Near complete resolution of metabolic activity at the base of tongue. Minimal residual activity is likely post treatment effect. 2. Complete resolution of metabolic activity above LEFT cervical lymph nodes. No evidence of residual metabolically active     0/16/0109 Procedure    Port-a-cath removed [name])     05/03/2018 -  Chemotherapy    The patient had palonosetron (ALOXI) injection 0.25 mg, 0.25 mg, Intravenous,  Once, 6 of 6 cycles Administration: 0.25 mg (05/03/2018), 0.25 mg (05/24/2018), 0.25 mg (06/14/2018), 0.25 mg (07/09/2018), 0.25 mg (07/30/2018), 0.25 mg (08/21/2018) pegfilgrastim-cbqv (UDENYCA) injection 6 mg, 6 mg, Subcutaneous, Once, 5 of 5 cycles Administration: 6 mg (05/27/2018), 6 mg (06/17/2018), 6 mg (07/11/2018), 6 mg (08/01/2018) CARBOplatin (PARAPLATIN) 380 mg in sodium chloride 0.9 % 250 mL chemo infusion, 380 mg (100 % of original dose 381 mg), Intravenous,  Once, 6 of 6 cycles Dose modification:   (original dose 381 mg, Cycle 1),   (original dose 309.5 mg, Cycle 2), 307.5 mg (original dose 309.5 mg, Cycle 5) Administration: 380 mg (05/03/2018), 310 mg (05/24/2018), 310 mg (06/14/2018), 340 mg (07/09/2018), 310 mg (07/30/2018), 350 mg (08/21/2018) PACLitaxel (TAXOL) 330 mg in sodium chloride 0.9 % 500 mL chemo infusion (> 57m/m2), 175 mg/m2 = 330 mg (100 % of original dose 175 mg/m2), Intravenous,  Once, 6 of 6  cycles Dose modification: 175 mg/m2 (original dose 175 mg/m2, Cycle 1, Reason: Patient Age) Administration: 330 mg (05/03/2018), 330 mg (05/24/2018), 330 mg (06/14/2018), 330 mg (07/09/2018), 330 mg (07/30/2018), 330 mg (08/21/2018)  for chemotherapy treatment.     05/24/2018 -  Chemotherapy    The patient had pembrolizumab (KEYTRUDA) 200 mg in sodium chloride 0.9 % 50 mL chemo infusion, 200 mg, Intravenous, Once, 5 of 6 cycles Administration: 200 mg  (05/24/2018), 200 mg (06/14/2018), 200 mg (07/09/2018), 200 mg (08/21/2018)  for chemotherapy treatment.      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 male returns for routine follow-up and consideration for next cycle of chemotherapy. He is here today alone. He states that he. Denies any nausea, vomiting, or diarrhea. Denies any new pains. Had not noticed any recent bleeding such as epistaxis, hematuria or hematochezia. Denies recent chest pain on exertion, shortness of breath on minimal exertion, pre-syncopal episodes, or palpitations. Denies any numbness or tingling in hands or feet. Denies any recent fevers, infections, or recent hospitalizations. Patient reports appetite at 100 % and energy level at 75 %.      REVIEW OF SYSTEMS:  Review of Systems  All other systems reviewed and are negative.    PAST MEDICAL/SURGICAL HISTORY:  Past Medical History:  Diagnosis Date  . GERD (gastroesophageal reflux disease)   . 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 Oropharyngeal cancer ([facility]) 07/28/2014   dx. 3 weeks ago.- Dr. [facility] [place], [place].  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 BIOPSY  01/15/2018   Procedure: BIOPSY;  Surgeon: Danie Binder, MD;  [facility];  Service: Endoscopy;;  gastric  . COLONOSCOPY N/A 03/13/2016   Procedure: COLONOSCOPY;  Surgeon: Danie Binder, MD;  [facility];  Service: Endoscopy;  Laterality: N/A;  2:15 PM  . ESOPHAGOGASTRODUODENOSCOPY (EGD) WITH PROPOFOL N/A 08/17/2014   Procedure:  ESOPHAGOGASTRODUODENOSCOPY (EGD) WITH PROPOFOL (procedure #1);  Surgeon: Aviva Signs Md, MD;  Location: [facility];  Service: General;  Laterality: N/A;  . ESOPHAGOGASTRODUODENOSCOPY (EGD) WITH PROPOFOL N/A 01/15/2018   Procedure: ESOPHAGOGASTRODUODENOSCOPY (EGD) WITH PROPOFOL;  Surgeon: Danie Binder, MD;  [facility];  Service: Endoscopy;  Laterality: N/A;  9:30am  . MULTIPLE EXTRACTIONS WITH ALVEOLOPLASTY N/A 08/12/2014   Procedure: Extraction of tooth #'s 6,17,22,23,24,25,26,27 with alveoloplasty;  Surgeon: Lenn Cal, DDS;  Location: [facility];  Service: Oral Surgery;  Laterality: N/A;  . PANENDOSCOPY N/A 08/06/2014   Procedure: PANENDOSCOPY WITH BIOPSY;  Surgeon: Leta Baptist, MD;  Location: [facility];  Service: ENT;  Laterality: N/A;  . PEG PLACEMENT Left 08/17/14  . PEG PLACEMENT N/A 08/17/2014   Procedure: PERCUTANEOUS ENDOSCOPIC GASTROSTOMY (PEG) PLACEMENT (procedure #1);  Surgeon: Aviva Signs Md, MD;  Location: [facility];  Service: General;  Laterality: N/A;  . PORT-A-CATH REMOVAL Right 07/17/2016   Procedure: MINOR REMOVAL PORT-A-CATH;  Surgeon: Aviva Signs, MD;  Location: [facility];  Service: General;  Laterality: Right;  . PORTACATH PLACEMENT Right 08/17/14  . PORTACATH  PLACEMENT Right 08/17/2014   Procedure: INSERTION PORT-A-CATH (procedure #2);  Surgeon: Aviva Signs Md, MD;  Location: [facility];  Service: General;  Laterality: Right;  . PORTACATH PLACEMENT Left 04/26/2018   Procedure: INSERTION PORT-A-CATH (attached catheter in left subclavian);  Surgeon: Aviva Signs, MD;  Location: [facility];  Service: General;  Laterality: Left;  . SAVORY DILATION N/A 01/15/2018   Procedure: SAVORY DILATION;  Surgeon: Danie Binder, MD;  [facility];  Service: Endoscopy;  Laterality: N/A;  . VIDEO BRONCHOSCOPY WITH ENDOBRONCHIAL ULTRASOUND N/A 04/15/2018   Procedure: VIDEO BRONCHOSCOPY WITH ENDOBRONCHIAL ULTRASOUND;  Surgeon: Melrose Nakayama, MD;  Location: [facility];   Service: Thoracic;  Laterality: N/A;     SOCIAL HISTORY:  Social History   Socioeconomic History  . Marital status: Legally Separated    Spouse name: Not on file  . Number of children: 5  . Years of education: Not on file  . Highest education level: Not on file  Occupational History  . Not on file  Social Needs  . Financial resource strain: Not on file  . Food insecurity:    Worry: Not on file    Inability: Not on file  . Transportation needs:    Medical: Not on file    Non-medical: Not on file  Tobacco Use  . Smoking status: Former Smoker    Packs/day: 0.50    Years: 30.00    Pack years: 15.00    Last attempt to quit: 07/22/2014    Years since quitting: 4.0  . Smokeless tobacco: Never Used  Substance and Sexual Activity  . Alcohol use: Not Currently    Alcohol/week: 0.0 standard drinks    Comment: None currently (11/09/17); previously 1-2 beers on the weekend  . Drug use: No  . Sexual activity: Not on file  Lifestyle  . Physical activity:    Days per week: Not on file    Minutes per session: Not on file  . Stress: Not on file  Relationships  . Social connections:    Talks on phone: Not on file    Gets together: Not on file    Attends religious service: Not on file    Active member of club or organization: Not on file    Attends meetings of clubs or organizations: Not on file    Relationship status: Not on file  . Intimate partner violence:    Fear of current or ex partner: Not on file    Emotionally abused: Not on file    Physically abused: Not on file    Forced sexual activity: Not on file  Other Topics Concern  . Not on file  Social History Narrative  . Not on file    FAMILY HISTORY:  Family History  Problem Relation Age of Onset  . Colon cancer Neg Hx   . Gastric cancer Neg Hx   . Esophageal cancer Neg Hx     CURRENT MEDICATIONS:  Outpatient Encounter Medications as of 08/21/2018  Medication Sig  . CARBOPLATIN IV Inject into the vein every 21 (  twenty-one) days.  Marland Kitchen dexamethasone (DECADRON) 4 MG tablet Take 2 tablets (8 mg total) by mouth daily. Start the day after chemotherapy for 2 days.  . feeding supplement, ENSURE ENLIVE, (ENSURE ENLIVE) LIQD Take 237 mLs by mouth 2 (two) times daily.  Marland Kitchen HYDROcodone-acetaminophen (NORCO/VICODIN) 5-325 MG tablet Take 1 tablet by mouth every 8 (eight) hours as needed for moderate pain.  Marland Kitchen levothyroxine (SYNTHROID, LEVOTHROID) 50 MCG  tablet TAKE 1 TABLET BY MOUTH DAILY BEFORE BREAKAST.  Marland Kitchen lidocaine-prilocaine (EMLA) cream Apply to affected area once (Patient taking differently: Apply to port a cath site and cover with plastic wrap one hour prior to appointment)  . LORazepam (ATIVAN) 0.5 MG tablet Take 1 tablet (0.5 mg total) by mouth every 6 (six) hours as needed (Nausea or vomiting).  Marland Kitchen omeprazole (PRILOSEC) 20 MG capsule 1 PO 30 MINS PRIOR TO BREAKFAST. (Patient taking differently: Take 20 mg by mouth daily before breakfast. 1 PO 30 MINS PRIOR TO BREAKFAST.)  . ondansetron (ZOFRAN) 8 MG tablet Take 1 tablet (8 mg total) by mouth 2 (two) times daily as needed for refractory nausea / vomiting. Start on day 3 after chemo.  Marland Kitchen PACLitaxel (TAXOL IV) Inject into the vein every 21 ( twenty-one) days.  . Pembrolizumab (KEYTRUDA IV) Inject into the vein every 21 ( twenty-one) days.  . prochlorperazine (COMPAZINE) 10 MG tablet Take 1 tablet (10 mg total) by mouth every 6 (six) hours as needed (Nausea or vomiting).  . [DISCONTINUED] HYDROcodone-acetaminophen (NORCO/VICODIN) 5-325 MG tablet Take 1 tablet by mouth every 8 (eight) hours as needed for moderate pain.   No facility-administered encounter medications on file as of 08/21/2018.     ALLERGIES:  No Known Allergies   PHYSICAL EXAM:  ECOG Performance status: 1  Vitals:   08/21/18 0746  BP: (!) 108/57  Pulse: 70  Resp: 18  Temp: 98.2 F (36.8 C)  SpO2: 100%   Filed Weights   08/21/18 0746  Weight: 152 lb 9.6 oz (69.2 kg)    Physical Exam  Vitals signs reviewed.  Constitutional:      Appearance: Normal appearance.  Cardiovascular:     Rate and Rhythm: Normal rate and regular rhythm.     Heart sounds: Normal heart sounds.  Pulmonary:     Effort: Pulmonary effort is normal.     Breath sounds: Normal breath sounds.  Abdominal:     General: There is no distension.     Palpations: Abdomen is soft. There is no mass.  Musculoskeletal:        General: No swelling.  Skin:    General: Skin is warm.  Neurological:     General: No focal deficit present.     Mental Status: He is alert and oriented to person, place, and time.  Psychiatric:        Mood and Affect: Mood normal.        Behavior: Behavior normal.      LABORATORY DATA:  I have reviewed the labs as listed.  CBC    Component Value Date/Time   WBC 3.6 (L) 08/21/2018 0745   RBC 3.18 (L) 08/21/2018 0745   HGB 9.8 (L) 08/21/2018 0745   HCT 30.9 (L) 08/21/2018 0745   PLT 271 08/21/2018 0745   MCV 97.2 08/21/2018 0745   MCH 30.8 08/21/2018 0745   MCHC 31.7 08/21/2018 0745   RDW 17.9 (H) 08/21/2018 0745   LYMPHSABS 0.9 08/21/2018 0745   MONOABS 0.5 08/21/2018 0745   EOSABS 0.1 08/21/2018 0745   BASOSABS 0.0 08/21/2018 0745   CMP Latest Ref Rng & Units 08/21/2018 07/30/2018 07/09/2018  Glucose 70 - 99 mg/dL 109(H) 130(H) 134(H)  BUN 8 - 23 mg/dL 16 15 22   Creatinine 0.61 - 1.24 mg/dL 1.51(H) 1.89(H) 1.64(H)  Sodium 135 - 145 mmol/L 141 140 139  Potassium 3.5 - 5.1 mmol/L 4.1 4.1 3.7  Chloride 98 - 111 mmol/L 105 105 105  CO2  22 - 32 mmol/L 24 26 24   Calcium 8.9 - 10.3 mg/dL 9.0 9.1 9.1  Total Protein 6.5 - 8.1 g/dL 7.0 7.1 7.3  Total Bilirubin 0.3 - 1.2 mg/dL 0.4 0.8 0.6  Alkaline Phos 38 - 126 U/L 65 65 64  AST 15 - 41 U/L 15 17 20   ALT 0 - 44 U/L 11 16 16        DIAGNOSTIC IMAGING:  I have independently reviewed the scans and discussed with the patient.   I have reviewed Venita Lick LPN's note and agree with the documentation.  I personally  performed a face-to-face visit, made revisions and my assessment and plan is as follows.    ASSESSMENT & PLAN:   Squamous cell lung cancer, left ([facility]) 1.  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  Persistent hypermetabolic activity in the right prostate gland. -PDL 1 testing was not done.  Foundation 1 testing shows MS-stable, TMB-27mts/mb, NOTCH 1 Loss, PTEN loss, RB1 loss, FAS loss - 5 cycles of carboplatin on top of paclitaxel and pembrolizumab from 05/03/2018 through 07/29/2018.   - PET CT scan on 07/22/2018 shows reduced size and reduced metabolic activity in the thoracic adenopathy, left lower lobe pulmonary nodules.  Stable enlarged indistinctly marginated left level 2B lymph node with SUV of 1.9.  There is increased tongue activity which is of uncertain significance.  Hypermetabolic activity in the prostate gland eccentric to the right side. - He is tolerating chemotherapy very well without any major side effects. He will proceed with Cycle 6 today of Carbo/taxel and Pembrolizumab. He will return in 3 weeks to continue maintenance Pembrolizumab.   2.  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  - PET scan did not show any hypermetabolic activity in the neck.  3.  Hypothyroidism: -He is not on any thyroid supplements.  We will continue to monitor it closely.  4.  Elevated PSA: -PSA was elevated at 39.6 on 05/16/2018. -PET scan showed increased uptake in the right side of the prostate.  We will plan to repeat it in the next few weeks.    Total time spent is 25 minutes with more than 50% of the time spent face-to-face discussing and reinforcing treatment plan and coordination of care.     Orders placed this encounter:  Orders Placed This Encounter  Procedures  . PSA      SDerek Jack MD [phone]

## 2018-08-21 NOTE — Progress Notes (Signed)
0854 Labs reviewed with and pt seen by Dr. Delton Coombes and pt approved for chemo tx today per MD                                                                                     Alexander [name] tolerated chemo tx well without complaints or incident. VSS upon discharge. Pt discharged self ambulatory in satisfactory condition

## 2018-08-21 NOTE — Assessment & Plan Note (Signed)
1.  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  Persistent hypermetabolic activity in the right prostate gland. -PDL 1 testing was not done.  Foundation 1 testing shows MS-stable, TMB-34mts/mb, NOTCH 1 Loss, PTEN loss, RB1 loss, FAS loss - 5 cycles of carboplatin on top of paclitaxel and pembrolizumab from 05/03/2018 through 07/29/2018.   - PET CT scan on 07/22/2018 shows reduced size and reduced metabolic activity in the thoracic adenopathy, left lower lobe pulmonary nodules.  Stable enlarged indistinctly marginated left level 2B lymph node with SUV of 1.9.  There is increased tongue activity which is of uncertain significance.  Hypermetabolic activity in the prostate gland eccentric to the right side. - He is tolerating chemotherapy very well without any major side effects. He will proceed with Cycle 6 today of Carbo/taxel and Pembrolizumab. He will return in 3 weeks to continue maintenance Pembrolizumab.   2.  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  - PET scan did not show any hypermetabolic activity in the neck.  3.  Hypothyroidism: -He is not on any thyroid supplements.  We will continue to monitor it closely.  4.  Elevated PSA: -PSA was elevated at 39.6 on 05/16/2018. -PET scan showed increased uptake in the right side of the prostate.  We will plan to repeat it in the next few weeks.

## 2018-08-21 NOTE — Patient Instructions (Signed)
[facility] at [facility] Discharge Instructions  Labs drawn from portacath today   Thank you for choosing [facility] at [facility] to provide your oncology and hematology care.  To afford each patient quality time with our provider, please arrive at least 15 minutes before your scheduled appointment time.   If you have a lab appointment with the Cancer Center please come in thru the  Main Entrance and check in at the main information desk  You need to re-schedule your appointment should you arrive 10 or more minutes late.  We strive to give you quality time with our providers, and arriving late affects you and other patients whose appointments are after yours.  Also, if you no show three or more times for appointments you may be dismissed from the clinic at the providers discretion.     Again, thank you for choosing [facility].  Our hope is that these requests will decrease the amount of time that you wait before being seen by our physicians.       _____________________________________________________________  Should you have questions after your visit to [facility], please contact our office at [phone] between the hours of 8:00 a.m. and 4:30 p.m.  Voicemails left after 4:00 p.m. will not be returned until the following business day.  For prescription refill requests, have your pharmacy contact our office and allow 72 hours.    Cancer Center Support Programs:   > Cancer Support Group  2nd Tuesday of the month 1pm-2pm, Journey Room   

## 2018-08-21 NOTE — Patient Instructions (Signed)
[facility] Discharge Instructions for Patients Receiving Chemotherapy   Beginning January 23rd 2017 lab work for the Cancer Center will be done in the  Main lab at [facility] on 1st floor. If you have a lab appointment with the Cancer Center please come in thru the  Main Entrance and check in at the main information desk   Today you received the following chemotherapy agents Taxol and Carboplatin. Follow-up as scheduled. Call clinic for any questions or concerns  To help prevent nausea and vomiting after your treatment, we encourage you to take your nausea medication.   If you develop nausea and vomiting, or diarrhea that is not controlled by your medication, call the clinic.  The clinic phone number is [phone]. Office hours are Monday-Friday 8:30am-5:00pm.  BELOW ARE SYMPTOMS THAT SHOULD BE REPORTED IMMEDIATELY:  *FEVER GREATER THAN 101.0 F  *CHILLS WITH OR WITHOUT FEVER  NAUSEA AND VOMITING THAT IS NOT CONTROLLED WITH YOUR NAUSEA MEDICATION  *UNUSUAL SHORTNESS OF BREATH  *UNUSUAL BRUISING OR BLEEDING  TENDERNESS IN MOUTH AND THROAT WITH OR WITHOUT PRESENCE OF ULCERS  *URINARY PROBLEMS  *BOWEL PROBLEMS  UNUSUAL RASH Items with * indicate a potential emergency and should be followed up as soon as possible. If you have an emergency after office hours please contact your primary care physician or go to the nearest emergency department.  Please call the clinic during office hours if you have any questions or concerns.   You may also contact the Patient Navigator at [phone] should you have any questions or need assistance in obtaining follow up care.      Resources For Cancer Patients and their Caregivers ? American Cancer Society: Can assist with transportation, wigs, general needs, runs Look Good Feel Better.        1-888-227-6333 ? Cancer Care: Provides financial assistance, online support groups, medication/co-pay assistance.   1-800-813-HOPE (4673) ? [facility] Assists [place] cancer patients and their families through emotional , educational and financial support.  336-427-4357 ? [place] DSS Where to apply for food stamps, Medicaid and utility assistance. 336-342-1394 ? [facility]: Transportation to medical appointments. 336-347-2287 ? Social Security Administration: May apply for disability if have a Stage IV cancer. 336-342-7796 1-800-772-1213 ? [place] Aging, Disability and Transit Services: Assists with nutrition, care and transit needs. 336-349-2343         

## 2018-08-21 NOTE — Assessment & Plan Note (Deleted)
1.  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  Persistent hypermetabolic activity in the right prostate gland. -PDL 1 testing was not done.  Foundation 1 testing shows MS-stable, TMB-37mts/mb, NOTCH 1 Loss, PTEN loss, RB1 loss, FAS loss - 4 cycles of carboplatin, paclitaxel and pembrolizumab from 05/03/2018 through 07/09/2018.  - PET CT scan on 07/22/2018 shows reduced size and reduced metabolic activity in the thoracic adenopathy, left lower lobe pulmonary nodules.  Stable enlarged indistinctly marginated left level 2B lymph node with SUV of 1.9.  There is increased tongue activity which is of uncertain significance.  Hypermetabolic activity in the prostate gland eccentric to the right side. - He is tolerating chemotherapy very well without any major side effects. He will proceed with Cycle 6 today of Carbo/taxel and Pembrolizumab. He will return in 3 weeks to continue maintenance Pembrolizumab.   2.  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  - PET scan did not show any hypermetabolic activity in the neck.  3.  Hypothyroidism: -He is not on any thyroid supplements.  We will continue to monitor it closely.  4.  Elevated PSA: -PSA was elevated at 39.6 on 05/24/2018.  We will plan to repeat it. -PET scan showed increased uptake in the right side of the prostate.   -We will plan to repeat it in next few weeks.

## 2018-08-23 ENCOUNTER — Inpatient Hospital Stay (HOSPITAL_COMMUNITY): Payer: Medicare Other

## 2018-08-23 ENCOUNTER — Other Ambulatory Visit: Payer: Self-pay

## 2018-08-23 VITALS — BP 104/61 | HR 76 | Temp 98.0°F | Resp 16

## 2018-08-23 DIAGNOSIS — C109 Malignant neoplasm of oropharynx, unspecified: Secondary | ICD-10-CM

## 2018-08-23 DIAGNOSIS — Z5112 Encounter for antineoplastic immunotherapy: Secondary | ICD-10-CM | POA: Diagnosis not present

## 2018-08-23 MED ORDER — PEGFILGRASTIM-CBQV 6 MG/0.6ML ~~LOC~~ SOSY
6.0000 mg | PREFILLED_SYRINGE | Freq: Once | SUBCUTANEOUS | Status: AC
  Administered 2018-08-23: 6 mg via SUBCUTANEOUS
  Filled 2018-08-23: qty 0.6

## 2018-08-23 NOTE — Progress Notes (Signed)
Alexander [name] presents today for injection per the provider's orders.  udenyca administration without incident; see MAR for injection details.  Patient tolerated procedure well and without incident.  No questions or complaints noted at this time. D/c ambulatory

## 2018-09-02 ENCOUNTER — Other Ambulatory Visit (HOSPITAL_COMMUNITY): Payer: Self-pay | Admitting: Hematology

## 2018-09-02 DIAGNOSIS — E038 Other specified hypothyroidism: Secondary | ICD-10-CM

## 2018-09-10 ENCOUNTER — Other Ambulatory Visit: Payer: Self-pay

## 2018-09-11 ENCOUNTER — Inpatient Hospital Stay (HOSPITAL_COMMUNITY): Payer: Medicare Other

## 2018-09-11 ENCOUNTER — Inpatient Hospital Stay (HOSPITAL_BASED_OUTPATIENT_CLINIC_OR_DEPARTMENT_OTHER): Payer: Medicare Other | Admitting: Hematology

## 2018-09-11 ENCOUNTER — Inpatient Hospital Stay (HOSPITAL_COMMUNITY): Payer: Medicare Other | Attending: Hematology

## 2018-09-11 ENCOUNTER — Other Ambulatory Visit: Payer: Self-pay

## 2018-09-11 ENCOUNTER — Encounter (HOSPITAL_COMMUNITY): Payer: Self-pay | Admitting: Hematology

## 2018-09-11 VITALS — BP 112/61 | HR 65 | Temp 98.6°F | Resp 16

## 2018-09-11 DIAGNOSIS — E039 Hypothyroidism, unspecified: Secondary | ICD-10-CM | POA: Diagnosis not present

## 2018-09-11 DIAGNOSIS — Z79899 Other long term (current) drug therapy: Secondary | ICD-10-CM | POA: Diagnosis not present

## 2018-09-11 DIAGNOSIS — Z5112 Encounter for antineoplastic immunotherapy: Secondary | ICD-10-CM | POA: Diagnosis present

## 2018-09-11 DIAGNOSIS — R972 Elevated prostate specific antigen [PSA]: Secondary | ICD-10-CM | POA: Diagnosis not present

## 2018-09-11 DIAGNOSIS — C01 Malignant neoplasm of base of tongue: Secondary | ICD-10-CM

## 2018-09-11 DIAGNOSIS — C3492 Malignant neoplasm of unspecified part of left bronchus or lung: Secondary | ICD-10-CM

## 2018-09-11 DIAGNOSIS — C3432 Malignant neoplasm of lower lobe, left bronchus or lung: Secondary | ICD-10-CM | POA: Insufficient documentation

## 2018-09-11 DIAGNOSIS — Z87891 Personal history of nicotine dependence: Secondary | ICD-10-CM

## 2018-09-11 DIAGNOSIS — Z9221 Personal history of antineoplastic chemotherapy: Secondary | ICD-10-CM | POA: Insufficient documentation

## 2018-09-11 DIAGNOSIS — C109 Malignant neoplasm of oropharynx, unspecified: Secondary | ICD-10-CM | POA: Diagnosis not present

## 2018-09-11 DIAGNOSIS — Z923 Personal history of irradiation: Secondary | ICD-10-CM | POA: Diagnosis not present

## 2018-09-11 LAB — CBC WITH DIFFERENTIAL/PLATELET
Abs Immature Granulocytes: 0.01 10*3/uL (ref 0.00–0.07)
Basophils Absolute: 0 10*3/uL (ref 0.0–0.1)
Basophils Relative: 1 %
Eosinophils Absolute: 0.1 10*3/uL (ref 0.0–0.5)
Eosinophils Relative: 2 %
HCT: 33.5 % — ABNORMAL LOW (ref 39.0–52.0)
Hemoglobin: 10.9 g/dL — ABNORMAL LOW (ref 13.0–17.0)
Immature Granulocytes: 0 %
Lymphocytes Relative: 35 %
Lymphs Abs: 0.8 10*3/uL (ref 0.7–4.0)
MCH: 31.5 pg (ref 26.0–34.0)
MCHC: 32.5 g/dL (ref 30.0–36.0)
MCV: 96.8 fL (ref 80.0–100.0)
Monocytes Absolute: 0.5 10*3/uL (ref 0.1–1.0)
Monocytes Relative: 21 %
Neutro Abs: 0.9 10*3/uL — ABNORMAL LOW (ref 1.7–7.7)
Neutrophils Relative %: 41 %
Platelets: 185 10*3/uL (ref 150–400)
RBC: 3.46 MIL/uL — ABNORMAL LOW (ref 4.22–5.81)
RDW: 18.8 % — ABNORMAL HIGH (ref 11.5–15.5)
WBC: 2.3 10*3/uL — ABNORMAL LOW (ref 4.0–10.5)
nRBC: 0 % (ref 0.0–0.2)

## 2018-09-11 LAB — COMPREHENSIVE METABOLIC PANEL
ALT: 11 U/L (ref 0–44)
AST: 12 U/L — ABNORMAL LOW (ref 15–41)
Albumin: 3.7 g/dL (ref 3.5–5.0)
Alkaline Phosphatase: 67 U/L (ref 38–126)
Anion gap: 14 (ref 5–15)
BUN: 13 mg/dL (ref 8–23)
CO2: 22 mmol/L (ref 22–32)
Calcium: 9.2 mg/dL (ref 8.9–10.3)
Chloride: 104 mmol/L (ref 98–111)
Creatinine, Ser: 1.53 mg/dL — ABNORMAL HIGH (ref 0.61–1.24)
GFR calc Af Amer: 53 mL/min — ABNORMAL LOW (ref >60)
GFR calc non Af Amer: 46 mL/min — ABNORMAL LOW (ref >60)
Glucose, Bld: 86 mg/dL (ref 70–99)
Potassium: 4.7 mmol/L (ref 3.5–5.1)
Sodium: 140 mmol/L (ref 135–145)
Total Bilirubin: 0.4 mg/dL (ref 0.3–1.2)
Total Protein: 6.8 g/dL (ref 6.5–8.1)

## 2018-09-11 MED ORDER — SODIUM CHLORIDE 0.9 % IV SOLN
200.0000 mg | Freq: Once | INTRAVENOUS | Status: AC
  Administered 2018-09-11: 200 mg via INTRAVENOUS
  Filled 2018-09-11: qty 8

## 2018-09-11 MED ORDER — SODIUM CHLORIDE 0.9% FLUSH
10.0000 mL | INTRAVENOUS | Status: DC | PRN
  Administered 2018-09-11: 10 mL
  Filled 2018-09-11: qty 10

## 2018-09-11 MED ORDER — SODIUM CHLORIDE 0.9 % IV SOLN
Freq: Once | INTRAVENOUS | Status: AC
  Administered 2018-09-11: 10:00:00 via INTRAVENOUS

## 2018-09-11 MED ORDER — HEPARIN SOD (PORK) LOCK FLUSH 100 UNIT/ML IV SOLN
500.0000 [IU] | Freq: Once | INTRAVENOUS | Status: AC | PRN
  Administered 2018-09-11: 500 [IU]

## 2018-09-11 NOTE — Progress Notes (Signed)
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   07/28/2014 Initial Diagnosis   Oropharyngeal cancer   08/03/2014 Imaging   CT CAP- L supraclavicular lymphadenopathy is not completely visualized. This is better seen on the previous neck CT from 07/27/2014. Otherwise, no evidence for metastatic disease in the chest, abdomen, or pelvis.   08/03/2014 Imaging   Bone scan- Uptake at adjacent anterior LEFT 6, 7, 8 ribs likely representing trauma/fractures. Questionable nonspecific increased tracer localization at the posterior RIGHT 8th and 9th ribs, the adjacent nature which raises a a question of trauma as well   08/06/2014 Pathology Results   Dr. Benjamine Mola- Oropharynx, biopsy, Left - INVASIVE SQUAMOUS CELL CARCINOMA.   08/12/2014 Procedure   Dr. Enrique Sack- 1. Multiple extraction of tooth numbers 6, 17, 22, 23, 24, 25, 26, and 27. 3 Quadrants of alveoloplasty   08/17/2014 Pathology Results   PORT and G-TUBE placed by Dr. Carlis Stable.   08/26/2014 PET scan   Large hypermetabolic mass in the left base of tongue. Activity extends across midline to the right base tongue. 2. Intensely hypermetabolic left cervical metastatic lymph nodes. Lymph nodes extend from the left level II position to the left supraclavi   09/01/2014 - 09/22/2014 Chemotherapy   Concurrent chemoradiation with Cisplatin 100 mg/m2 x 2 cycles with Neulasta support. Held cycle #3 d/t renal toxicity.    09/03/2014 - 10/23/2014 Radiation Therapy   Treated in [place], IMRT Isidore Moos).  Base of tongue and bilat neck. Total dose: 70 Gy in 35  fractions. (of note, he did miss several treatments requiring BID dosing towards the end of treatment).    01/25/2015 PET scan   Near complete resolution of metabolic activity at the base of tongue. Minimal residual activity is likely post treatment effect. 2. Complete resolution of metabolic activity above LEFT cervical lymph nodes. No evidence of residual metabolically active    05/25/6008 Procedure   Port-a-cath removed [name])    05/03/2018 -  Chemotherapy   The patient had palonosetron (ALOXI) injection 0.25 mg, 0.25 mg, Intravenous,  Once, 6 of 6 cycles Administration: 0.25 mg (05/03/2018), 0.25 mg (05/24/2018), 0.25 mg (06/14/2018), 0.25 mg (07/09/2018), 0.25 mg (07/30/2018), 0.25 mg (08/21/2018) pegfilgrastim-cbqv (UDENYCA) injection 6 mg, 6 mg, Subcutaneous, Once, 5 of 5 cycles Administration: 6 mg (05/27/2018), 6 mg (06/17/2018), 6 mg (07/11/2018), 6 mg (08/01/2018), 6 mg (08/23/2018) CARBOplatin (PARAPLATIN) 380 mg in sodium chloride 0.9 % 250 mL chemo infusion, 380 mg (100 % of original dose 381 mg), Intravenous,  Once, 6 of 6 cycles Dose modification:   (original dose 381 mg, Cycle 1),   (original dose 309.5 mg, Cycle 2), 307.5 mg (original dose 309.5 mg, Cycle 5) Administration: 380 mg (05/03/2018), 310 mg (05/24/2018), 310 mg (06/14/2018), 340 mg (07/09/2018), 310 mg (07/30/2018), 350 mg (08/21/2018) PACLitaxel (TAXOL) 330 mg in sodium chloride 0.9 % 500 mL chemo infusion (> 42m/m2), 175 mg/m2 = 330 mg (100 % of original dose 175 mg/m2), Intravenous,  Once, 6 of 6 cycles Dose modification: 175 mg/m2 (original dose 175 mg/m2, Cycle 1, Reason: Patient Age) Administration: 330 mg (05/03/2018), 330 mg (05/24/2018), 330  mg (06/14/2018), 330 mg (07/09/2018), 330 mg (07/30/2018), 330 mg (08/21/2018)  for chemotherapy treatment.    05/24/2018 -  Chemotherapy   The patient had pembrolizumab (KEYTRUDA) 200 mg in sodium chloride 0.9 % 50 mL chemo infusion, 200 mg, Intravenous, Once, 6 of 8 cycles Administration: 200 mg  (05/24/2018), 200 mg (06/14/2018), 200 mg (07/09/2018), 200 mg (08/21/2018)  for chemotherapy treatment.    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 Wlodarczyk 68 y.o. male seen for follow-up and next treatment.  He received cycle 6 on 08/21/2018.  Denies any fevers, night sweats or weight loss.  He tolerated his last cycle very well.  Denied any nausea, vomiting, diarrhea or constipation.  Denied any tingling or numbness in extremities.  No fevers or infections were reported.  Denied any ER visits or hospitalizations.  No skin rashes reported.  Appetite is 50% and energy levels are 75%.      REVIEW OF SYSTEMS:  Review of Systems  All other systems reviewed and are negative.    PAST MEDICAL/SURGICAL HISTORY:  Past Medical History:  Diagnosis Date   GERD (gastroesophageal reflux disease)    Mass of neck    dx. oropharyngeal squamous cell carcinoma- Chemo. radiation planned   Oropharyngeal cancer ([place]) 07/28/2014   dx. 3 weeks ago.- Dr. [facility] [place], [place].   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   SCCa of Left BOT   Past Surgical History:  Procedure Laterality Date   BIOPSY  01/15/2018   Procedure: BIOPSY;  Surgeon: Danie Binder, MD;  [facility];  Service: Endoscopy;;  gastric   COLONOSCOPY N/A 03/13/2016   Procedure: COLONOSCOPY;  Surgeon: Danie Binder, MD;  [facility];  Service: Endoscopy;  Laterality: N/A;  2:15 PM   ESOPHAGOGASTRODUODENOSCOPY (EGD) WITH PROPOFOL N/A 08/17/2014   Procedure: ESOPHAGOGASTRODUODENOSCOPY (EGD) WITH PROPOFOL (procedure #1);  Surgeon: Aviva Signs Md, MD;  Location: [facility];  Service: General;  Laterality: N/A;    ESOPHAGOGASTRODUODENOSCOPY (EGD) WITH PROPOFOL N/A 01/15/2018   Procedure: ESOPHAGOGASTRODUODENOSCOPY (EGD) WITH PROPOFOL;  Surgeon: Danie Binder, MD;  [facility];  Service: Endoscopy;  Laterality: N/A;  9:30am   MULTIPLE EXTRACTIONS WITH ALVEOLOPLASTY N/A 08/12/2014   Procedure: Extraction of tooth #'s 6,17,22,23,24,25,26,27 with alveoloplasty;  Surgeon: Lenn Cal, DDS;  [facility];  Service: Oral Surgery;  Laterality: N/A;   PANENDOSCOPY N/A 08/06/2014   Procedure: PANENDOSCOPY WITH BIOPSY;  Surgeon: Leta Baptist, MD;  Location: [facility];  Service: ENT;  Laterality: N/A;   PEG PLACEMENT Left 08/17/14   PEG PLACEMENT N/A 08/17/2014   Procedure: PERCUTANEOUS ENDOSCOPIC GASTROSTOMY (PEG) PLACEMENT (procedure #1);  Surgeon: Aviva Signs Md, MD;  Location: [facility];  Service: General;  Laterality: N/A;   PORT-A-CATH REMOVAL Right 07/17/2016   Procedure: MINOR REMOVAL PORT-A-CATH;  Surgeon: Aviva Signs, MD;  Location[facility];  Service: General;  Laterality: Right;   PORTACATH PLACEMENT Right 08/17/14   PORTACATH PLACEMENT Right 08/17/2014   Procedure: INSERTION PORT-A-CATH (procedure #2);  Surgeon: Aviva Signs Md, MD;  Location[facility];  Service: General;  Laterality: Right;   PORTACATH PLACEMENT Left 04/26/2018   Procedure: INSERTION PORT-A-CATH (attached catheter in left  subclavian);  Surgeon: Aviva Signs, MD;  Location[facility];  Service: General;  Laterality: Left;   SAVORY DILATION N/A 01/15/2018   Procedure: SAVORY DILATION;  Surgeon: Danie Binder, MD;  [facility];  Service: Endoscopy;  Laterality: N/A;   VIDEO BRONCHOSCOPY WITH ENDOBRONCHIAL ULTRASOUND N/A 04/15/2018   Procedure: VIDEO BRONCHOSCOPY WITH ENDOBRONCHIAL ULTRASOUND;  Surgeon: Melrose Nakayama, MD;  Location: [facility];  Service: Thoracic;  Laterality: N/A;     SOCIAL HISTORY:  Social History   Socioeconomic History   Marital status: Legally Separated    Spouse  name: Not on file   Number of children: 5   Years of education: Not on file   Highest education level: Not on file  Occupational History   Not on file  Social Needs   Financial resource strain: Not on file   Food insecurity    Worry: Not on file    Inability: Not on file   Transportation needs    Medical: Not on file    Non-medical: Not on file  Tobacco Use   Smoking status: Former Smoker    Packs/day: 0.50    Years: 30.00    Pack years: 15.00    Quit date: 07/22/2014    Years since quitting: 4.1   Smokeless tobacco: Never Used  Substance and Sexual Activity   Alcohol use: Not Currently    Alcohol/week: 0.0 standard drinks    Comment: None currently (11/09/17); previously 1-2 beers on the weekend   Drug use: No   Sexual activity: Not on file  Lifestyle   Physical activity    Days per week: Not on file    Minutes per session: Not on file   Stress: Not on file  Relationships   Social connections    Talks on phone: Not on file    Gets together: Not on file    Attends religious service: Not on file    Active member of club or organization: Not on file    Attends meetings of clubs or organizations: Not on file    Relationship status: Not on file   Intimate partner violence    Fear of current or ex partner: Not on file    Emotionally abused: Not on file    Physically abused: Not on file    Forced sexual activity: Not on file  Other Topics Concern   Not on file  Social History Narrative   Not on file    FAMILY HISTORY:  Family History  Problem Relation Age of Onset   Colon cancer Neg Hx    Gastric cancer Neg Hx    Esophageal cancer Neg Hx     CURRENT MEDICATIONS:  Outpatient Encounter Medications as of 09/11/2018  Medication Sig   CARBOPLATIN IV Inject into the vein every 21 ( twenty-one) days.   dexamethasone (DECADRON) 4 MG tablet Take 2 tablets (8 mg total) by mouth daily. Start the day after chemotherapy for 2 days.   feeding  supplement, ENSURE ENLIVE, (ENSURE ENLIVE) LIQD Take 237 mLs by mouth 2 (two) times daily.   HYDROcodone-acetaminophen (NORCO/VICODIN) 5-325 MG tablet Take 1 tablet by mouth every 8 (eight) hours as needed for moderate pain.   levothyroxine (SYNTHROID) 50 MCG tablet TAKE 1 TABLET BY MOUTH DAILY BEFORE BREAKAST.   lidocaine-prilocaine (EMLA) cream Apply to affected area once (Patient taking differently: Apply to port a cath site and cover with plastic wrap one hour prior to appointment)   LORazepam (ATIVAN) 0.5 MG  tablet Take 1 tablet (0.5 mg total) by mouth every 6 (six) hours as needed (Nausea or vomiting).   omeprazole (PRILOSEC) 20 MG capsule 1 PO 30 MINS PRIOR TO BREAKFAST. (Patient taking differently: Take 20 mg by mouth daily before breakfast. 1 PO 30 MINS PRIOR TO BREAKFAST.)   ondansetron (ZOFRAN) 8 MG tablet Take 1 tablet (8 mg total) by mouth 2 (two) times daily as needed for refractory nausea / vomiting. Start on day 3 after chemo.   PACLitaxel (TAXOL IV) Inject into the vein every 21 ( twenty-one) days.   Pembrolizumab (KEYTRUDA IV) Inject into the vein every 21 ( twenty-one) days.   prochlorperazine (COMPAZINE) 10 MG tablet Take 1 tablet (10 mg total) by mouth every 6 (six) hours as needed (Nausea or vomiting).   No facility-administered encounter medications on file as of 09/11/2018.     ALLERGIES:  No Known Allergies   PHYSICAL EXAM:  ECOG Performance status: 1  Vitals:   09/11/18 0900  BP: (!) 91/51  Pulse: 74  Resp: 18  Temp: 98.1 F (36.7 C)  SpO2: 100%   Filed Weights   09/11/18 0900  Weight: 151 lb 8 oz (68.7 kg)    Physical Exam Vitals signs reviewed.  Constitutional:      Appearance: Normal appearance.  Cardiovascular:     Rate and Rhythm: Normal rate and regular rhythm.     Heart sounds: Normal heart sounds.  Pulmonary:     Effort: Pulmonary effort is normal.     Breath sounds: Normal breath sounds.  Abdominal:     General: There is no  distension.     Palpations: Abdomen is soft. There is no mass.  Musculoskeletal:        General: No swelling.  Skin:    General: Skin is warm.  Neurological:     General: No focal deficit present.     Mental Status: He is alert and oriented to person, place, and time.  Psychiatric:        Mood and Affect: Mood normal.        Behavior: Behavior normal.      LABORATORY DATA:  I have reviewed the labs as listed.  CBC    Component Value Date/Time   WBC 2.3 (L) 09/11/2018 0911   RBC 3.46 (L) 09/11/2018 0911   HGB 10.9 (L) 09/11/2018 0911   HCT 33.5 (L) 09/11/2018 0911   PLT 185 09/11/2018 0911   MCV 96.8 09/11/2018 0911   MCH 31.5 09/11/2018 0911   MCHC 32.5 09/11/2018 0911   RDW 18.8 (H) 09/11/2018 0911   LYMPHSABS 0.8 09/11/2018 0911   MONOABS 0.5 09/11/2018 0911   EOSABS 0.1 09/11/2018 0911   BASOSABS 0.0 09/11/2018 0911   CMP Latest Ref Rng & Units 09/11/2018 08/21/2018 07/30/2018  Glucose 70 - 99 mg/dL 86 109(H) 130(H)  BUN 8 - 23 mg/dL 13 16 15   Creatinine 0.61 - 1.24 mg/dL 1.53(H) 1.51(H) 1.89(H)  Sodium 135 - 145 mmol/L 140 141 140  Potassium 3.5 - 5.1 mmol/L 4.7 4.1 4.1  Chloride 98 - 111 mmol/L 104 105 105  CO2 22 - 32 mmol/L 22 24 26   Calcium 8.9 - 10.3 mg/dL 9.2 9.0 9.1  Total Protein 6.5 - 8.1 g/dL 6.8 7.0 7.1  Total Bilirubin 0.3 - 1.2 mg/dL 0.4 0.4 0.8  Alkaline Phos 38 - 126 U/L 67 65 65  AST 15 - 41 U/L 12(L) 15 17  ALT 0 - 44 U/L 11 11 16  DIAGNOSTIC IMAGING:  I have independently reviewed the scans and discussed with the patient.   I have reviewed Venita Lick LPN's note and agree with the documentation.  I personally performed a face-to-face visit, made revisions and my assessment and plan is as follows.    ASSESSMENT & PLAN:   Squamous cell lung cancer, left ([facility] [place]) 1.  Advanced squamous cell carcinoma of the left lung: - PET scan on 02/18/2018 showed interval development of hypermetabolic mediastinal and left hilar adenopathy  associated with small hypermetabolic left lung nodules both in upper and lower lobes.  Persistent hypermetabolic activity in the right prostate gland. -PDL 1 testing was not done.  Foundation 1 testing shows MS-stable, TMB-76mts/mb, NOTCH 1 Loss, PTEN loss, RB1 loss, FAS loss - 6 cycles of carboplatinum, paclitaxel and pembrolizumab from 05/03/2018 through 08/21/2018. -PET scan on 07/22/2018 shows reduced size and reduced metabolic activity in the thoracic adenopathy, left lower lobe pulmonary nodules.  Stable enlarged indistinctly marginated left level 2B node with SUV of 1.9.  There is increased tongue activity which is of uncertain significance.  Hypermetabolic activity in the prostate gland eccentric to the right side. -He has tolerated his last cycle of chemotherapy very well.  I have reviewed his blood work.  White count is slightly low with ANC of around thousand.  He may proceed with his immunotherapy maintenance starting today. -I will see him back in 3 weeks for follow-up.  I plan to schedule scans after next treatment.  2.  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 -PET scan did not show any hypermetabolic activity in the neck.  3.  Hypothyroidism: -Last TSH in March was 6.6.  We will continue to monitor it closely.  He is not on any supplements.  4.  Elevated PSA: -PSA was elevated at 39.6 on 05/16/2018. -PET scan showed increased uptake in the right side of the prostate.  We will plan to repeat it in the next few weeks.    Total time spent is 25 minutes with more than 50% of the time spent face-to-face discussing and reinforcing treatment plan and coordination of care.    Orders placed this encounter:  No orders of the defined types were placed in this encounter.     SDerek Jack MD [phone]

## 2018-09-11 NOTE — Patient Instructions (Signed)
[facility] at [facility] Discharge Instructions  Labs drawn from portacath today   Thank you for choosing [facility] at [facility] to provide your oncology and hematology care.  To afford each patient quality time with our provider, please arrive at least 15 minutes before your scheduled appointment time.   If you have a lab appointment with the Cancer Center please come in thru the  Main Entrance and check in at the main information desk  You need to re-schedule your appointment should you arrive 10 or more minutes late.  We strive to give you quality time with our providers, and arriving late affects you and other patients whose appointments are after yours.  Also, if you no show three or more times for appointments you may be dismissed from the clinic at the providers discretion.     Again, thank you for choosing [facility].  Our hope is that these requests will decrease the amount of time that you wait before being seen by our physicians.       _____________________________________________________________  Should you have questions after your visit to  Cancer Center, please contact our office at [phone] between the hours of 8:00 a.m. and 4:30 p.m.  Voicemails left after 4:00 p.m. will not be returned until the following business day.  For prescription refill requests, have your pharmacy contact our office and allow 72 hours.    Cancer Center Support Programs:   > Cancer Support Group  2nd Tuesday of the month 1pm-2pm, Journey Room   

## 2018-09-11 NOTE — Progress Notes (Signed)
Message received from [facility] LPN that pt is good for treatment. Labs pending. VS within parameters for tx.   Reported labs to [facility] LPN. Creat 1.53, wbc 2.3 and neut # of 0.9. MD aware. Proceed with treatment per [facility] LPN via instant message. Labs reviewed by Dr. Delton Coombes.   Treatment given today per MD orders. Tolerated infusion without adverse affects. Vital signs stable. No complaints at this time. Discharged from clinic ambulatory. F/U with [facility] as scheduled.

## 2018-09-11 NOTE — Assessment & Plan Note (Signed)
1.  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  Persistent hypermetabolic activity in the right prostate gland. -PDL 1 testing was not done.  Foundation 1 testing shows MS-stable, TMB-8mts/mb, NOTCH 1 Loss, PTEN loss, RB1 loss, FAS loss - 6 cycles of carboplatinum, paclitaxel and pembrolizumab from 05/03/2018 through 08/21/2018. -PET scan on 07/22/2018 shows reduced size and reduced metabolic activity in the thoracic adenopathy, left lower lobe pulmonary nodules.  Stable enlarged indistinctly marginated left level 2B node with SUV of 1.9.  There is increased tongue activity which is of uncertain significance.  Hypermetabolic activity in the prostate gland eccentric to the right side. -He has tolerated his last cycle of chemotherapy very well.  I have reviewed his blood work.  White count is slightly low with ANC of around thousand.  He may proceed with his immunotherapy maintenance starting today. -I will see him back in 3 weeks for follow-up.  I plan to schedule scans after next treatment.  2.  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 -PET scan did not show any hypermetabolic activity in the neck.  3.  Hypothyroidism: -Last TSH in March was 6.6.  We will continue to monitor it closely.  He is not on any supplements.  4.  Elevated PSA: -PSA was elevated at 39.6 on 05/16/2018. -PET scan showed increased uptake in the right side of the prostate.  We will plan to repeat it in the next few weeks.

## 2018-09-11 NOTE — Patient Instructions (Addendum)
[facility] at [facility] Discharge Instructions  You were seen today by Dr. Katragadda. He went over your recent lab results. He will see you back in 3 weeks for labs and follow up.   Thank you for choosing [facility] at [facility] to provide your oncology and hematology care.  To afford each patient quality time with our provider, please arrive at least 15 minutes before your scheduled appointment time.   If you have a lab appointment with the Cancer Center please come in thru the  Main Entrance and check in at the main information desk  You need to re-schedule your appointment should you arrive 10 or more minutes late.  We strive to give you quality time with our providers, and arriving late affects you and other patients whose appointments are after yours.  Also, if you no show three or more times for appointments you may be dismissed from the clinic at the providers discretion.     Again, thank you for choosing [facility].  Our hope is that these requests will decrease the amount of time that you wait before being seen by our physicians.       _____________________________________________________________  Should you have questions after your visit to [facility], please contact our office at [phone] between the hours of 8:00 a.m. and 4:30 p.m.  Voicemails left after 4:00 p.m. will not be returned until the following business day.  For prescription refill requests, have your pharmacy contact our office and allow 72 hours.    Cancer Center Support Programs:   > Cancer Support Group  2nd Tuesday of the month 1pm-2pm, Journey Room    

## 2018-09-13 ENCOUNTER — Ambulatory Visit (HOSPITAL_COMMUNITY): Payer: Medicare Other

## 2018-10-02 ENCOUNTER — Encounter (HOSPITAL_COMMUNITY): Payer: Self-pay | Admitting: Hematology

## 2018-10-02 ENCOUNTER — Inpatient Hospital Stay (HOSPITAL_BASED_OUTPATIENT_CLINIC_OR_DEPARTMENT_OTHER): Payer: Medicare Other | Admitting: Hematology

## 2018-10-02 ENCOUNTER — Other Ambulatory Visit: Payer: Self-pay

## 2018-10-02 ENCOUNTER — Inpatient Hospital Stay (HOSPITAL_COMMUNITY): Payer: Medicare Other

## 2018-10-02 ENCOUNTER — Inpatient Hospital Stay (HOSPITAL_COMMUNITY): Payer: Medicare Other | Attending: Hematology

## 2018-10-02 VITALS — BP 111/67 | HR 62 | Temp 97.1°F | Resp 18 | Wt 149.2 lb

## 2018-10-02 VITALS — BP 136/63 | HR 55 | Temp 97.5°F | Resp 16

## 2018-10-02 DIAGNOSIS — C109 Malignant neoplasm of oropharynx, unspecified: Secondary | ICD-10-CM

## 2018-10-02 DIAGNOSIS — M25569 Pain in unspecified knee: Secondary | ICD-10-CM | POA: Insufficient documentation

## 2018-10-02 DIAGNOSIS — Z9221 Personal history of antineoplastic chemotherapy: Secondary | ICD-10-CM | POA: Diagnosis not present

## 2018-10-02 DIAGNOSIS — Z79899 Other long term (current) drug therapy: Secondary | ICD-10-CM

## 2018-10-02 DIAGNOSIS — Z5112 Encounter for antineoplastic immunotherapy: Secondary | ICD-10-CM | POA: Diagnosis present

## 2018-10-02 DIAGNOSIS — Z87891 Personal history of nicotine dependence: Secondary | ICD-10-CM

## 2018-10-02 DIAGNOSIS — C3482 Malignant neoplasm of overlapping sites of left bronchus and lung: Secondary | ICD-10-CM

## 2018-10-02 DIAGNOSIS — R972 Elevated prostate specific antigen [PSA]: Secondary | ICD-10-CM | POA: Diagnosis not present

## 2018-10-02 DIAGNOSIS — C3492 Malignant neoplasm of unspecified part of left bronchus or lung: Secondary | ICD-10-CM

## 2018-10-02 DIAGNOSIS — Z85818 Personal history of malignant neoplasm of other sites of lip, oral cavity, and pharynx: Secondary | ICD-10-CM | POA: Insufficient documentation

## 2018-10-02 DIAGNOSIS — C01 Malignant neoplasm of base of tongue: Secondary | ICD-10-CM | POA: Insufficient documentation

## 2018-10-02 LAB — COMPREHENSIVE METABOLIC PANEL
ALT: 18 U/L (ref 0–44)
AST: 21 U/L (ref 15–41)
Albumin: 3.9 g/dL (ref 3.5–5.0)
Alkaline Phosphatase: 56 U/L (ref 38–126)
Anion gap: 10 (ref 5–15)
BUN: 15 mg/dL (ref 8–23)
CO2: 24 mmol/L (ref 22–32)
Calcium: 9.1 mg/dL (ref 8.9–10.3)
Chloride: 106 mmol/L (ref 98–111)
Creatinine, Ser: 1.57 mg/dL — ABNORMAL HIGH (ref 0.61–1.24)
GFR calc Af Amer: 52 mL/min — ABNORMAL LOW (ref >60)
GFR calc non Af Amer: 45 mL/min — ABNORMAL LOW (ref >60)
Glucose, Bld: 117 mg/dL — ABNORMAL HIGH (ref 70–99)
Potassium: 4.2 mmol/L (ref 3.5–5.1)
Sodium: 140 mmol/L (ref 135–145)
Total Bilirubin: 0.3 mg/dL (ref 0.3–1.2)
Total Protein: 6.8 g/dL (ref 6.5–8.1)

## 2018-10-02 LAB — CBC WITH DIFFERENTIAL/PLATELET
Abs Immature Granulocytes: 0 10*3/uL (ref 0.00–0.07)
Basophils Absolute: 0 10*3/uL (ref 0.0–0.1)
Basophils Relative: 1 %
Eosinophils Absolute: 0.1 10*3/uL (ref 0.0–0.5)
Eosinophils Relative: 2 %
HCT: 37 % — ABNORMAL LOW (ref 39.0–52.0)
Hemoglobin: 11.7 g/dL — ABNORMAL LOW (ref 13.0–17.0)
Immature Granulocytes: 0 %
Lymphocytes Relative: 31 %
Lymphs Abs: 0.9 10*3/uL (ref 0.7–4.0)
MCH: 31.2 pg (ref 26.0–34.0)
MCHC: 31.6 g/dL (ref 30.0–36.0)
MCV: 98.7 fL (ref 80.0–100.0)
Monocytes Absolute: 0.3 10*3/uL (ref 0.1–1.0)
Monocytes Relative: 10 %
Neutro Abs: 1.8 10*3/uL (ref 1.7–7.7)
Neutrophils Relative %: 56 %
Platelets: 169 10*3/uL (ref 150–400)
RBC: 3.75 MIL/uL — ABNORMAL LOW (ref 4.22–5.81)
RDW: 16.4 % — ABNORMAL HIGH (ref 11.5–15.5)
WBC: 3.1 10*3/uL — ABNORMAL LOW (ref 4.0–10.5)
nRBC: 0 % (ref 0.0–0.2)

## 2018-10-02 MED ORDER — HEPARIN SOD (PORK) LOCK FLUSH 100 UNIT/ML IV SOLN
500.0000 [IU] | Freq: Once | INTRAVENOUS | Status: AC | PRN
  Administered 2018-10-02: 500 [IU]

## 2018-10-02 MED ORDER — SODIUM CHLORIDE 0.9% FLUSH
10.0000 mL | INTRAVENOUS | Status: DC | PRN
  Administered 2018-10-02: 10 mL
  Filled 2018-10-02: qty 10

## 2018-10-02 MED ORDER — SODIUM CHLORIDE 0.9 % IV SOLN
200.0000 mg | Freq: Once | INTRAVENOUS | Status: AC
  Administered 2018-10-02: 200 mg via INTRAVENOUS
  Filled 2018-10-02: qty 8

## 2018-10-02 MED ORDER — SODIUM CHLORIDE 0.9 % IV SOLN
Freq: Once | INTRAVENOUS | Status: AC
  Administered 2018-10-02: 10:00:00 via INTRAVENOUS

## 2018-10-02 MED ORDER — HYDROCODONE-ACETAMINOPHEN 5-325 MG PO TABS: 1.0000 | ORAL_TABLET | Freq: Three times a day (TID) | ORAL | 0 refills | Status: DC | PRN

## 2018-10-02 NOTE — Patient Instructions (Signed)
[facility] Discharge Instructions for Patients Receiving Chemotherapy   Beginning January 23rd 2017 lab work for the [facility] will be done in the  Main lab at [facility] on 1st floor. If you have a lab appointment with the [facility] please come in thru the  Main Entrance and check in at the main information desk   Today you received the following chemotherapy agents Keytruda. Follow-up as scheduled. Call clinic for any questions or concerns  To help prevent nausea and vomiting after your treatment, we encourage you to take your nausea medication   If you develop nausea and vomiting, or diarrhea that is not controlled by your medication, call the clinic.  The clinic phone number is [phone]. Office hours are Monday-Friday 8:30am-5:00pm.  BELOW ARE SYMPTOMS THAT SHOULD BE REPORTED IMMEDIATELY:  *FEVER GREATER THAN 101.0 F  *CHILLS WITH OR WITHOUT FEVER  NAUSEA AND VOMITING THAT IS NOT CONTROLLED WITH YOUR NAUSEA MEDICATION  *UNUSUAL SHORTNESS OF BREATH  *UNUSUAL BRUISING OR BLEEDING  TENDERNESS IN MOUTH AND THROAT WITH OR WITHOUT PRESENCE OF ULCERS  *URINARY PROBLEMS  *BOWEL PROBLEMS  UNUSUAL RASH Items with * indicate a potential emergency and should be followed up as soon as possible. If you have an emergency after office hours please contact your primary care physician or go to the nearest emergency department.  Please call the clinic during office hours if you have any questions or concerns.   You may also contact the Patient Navigator at [phone] should you have any questions or need assistance in obtaining follow up care.      Resources For Cancer Patients and their Caregivers ? American Cancer Society: Can assist with transportation, wigs, general needs, runs Look Good Feel Better.        316 081 0523 ? Cancer Care: Provides financial assistance, online support groups, medication/co-pay assistance.  1-800-813-HOPE  517-696-2188) ? [place] Assists [place] cancer patients and their families through emotional , educational and financial support.  7734376214 ? [place] DSS Where to apply for food stamps, Medicaid and utility assistance. 5051769354 ? [facility]: Transportation to medical appointments. (503)219-5674 ? Social Security Administration: May apply for disability if have a Stage IV cancer. (260)457-3223 507-253-9152 ? [employer], Disability and Transit Services: Assists with nutrition, care and transit needs. (613) 329-1824

## 2018-10-02 NOTE — Assessment & Plan Note (Signed)
1.  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  Persistent hypermetabolic activity in the right prostate gland. -PDL 1 testing was not done.  Foundation 1 testing shows MS-stable, TMB-39mts/mb, NOTCH 1 Loss, PTEN loss, RB1 loss, FAS loss - 6 cycles of carboplatinum, paclitaxel and pembrolizumab from 05/03/2018 through 08/21/2018. -PET scan on 07/22/2018 shows reduced size and reduced metabolic activity in the thoracic adenopathy, left lower lobe pulmonary nodules.  Stable enlarged indistinctly marginated left level 2B node with SUV of 1.9.  There is increased tongue activity which is of uncertain significance.  Hypermetabolic activity in the prostate gland eccentric to the right side. -Maintenance pembrolizumab started on 09/11/2018. -He is tolerating immunotherapy very well.  He does not have any immunotherapy related side effects. -I will see him back in 3 weeks for follow-up.  I plan to repeat a PET scan prior to next visit.  2.  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 -PET scan did not show any hypermetabolic activity in the neck.  3.  Bilateral knee pains: - He takes hydrocodone 5 mg as needed, averaging up to 4 tablets/week. - We will send a refill for it.  4.  Elevated PSA: -PSA was elevated at 39.6 on 05/16/2018. -PET scan showed increased uptake in the right side of the prostate.  We will repeat PSA at next visit.

## 2018-10-02 NOTE — Progress Notes (Signed)
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   07/28/2014 Initial Diagnosis   Oropharyngeal cancer   08/03/2014 Imaging   CT CAP- L supraclavicular lymphadenopathy is not completely visualized. This is better seen on the previous neck CT from 07/27/2014. Otherwise, no evidence for metastatic disease in the chest, abdomen, or pelvis.   08/03/2014 Imaging   Bone scan- Uptake at adjacent anterior LEFT 6, 7, 8 ribs likely representing trauma/fractures. Questionable nonspecific increased tracer localization at the posterior RIGHT 8th and 9th ribs, the adjacent nature which raises a a question of trauma as well   08/06/2014 Pathology Results   Dr. Benjamine [name]- Oropharynx, biopsy, Left - INVASIVE SQUAMOUS CELL CARCINOMA.   08/12/2014 Procedure   Dr. Enrique [name]- 1. Multiple extraction of tooth numbers 6, 17, 22, 23, 24, 25, 26, and 27. 3 Quadrants of alveoloplasty   08/17/2014 Pathology Results   PORT and G-TUBE placed by Dr. Carlis [name].   08/26/2014 PET scan   Large hypermetabolic mass in the left base of tongue. Activity extends across midline to the right base tongue. 2. Intensely hypermetabolic left cervical metastatic lymph nodes. Lymph nodes extend from the left level II position to the left supraclavi   09/01/2014 - 09/22/2014 Chemotherapy   Concurrent chemoradiation with Cisplatin 100 mg/m2 x 2 cycles with Neulasta support. Held cycle #3 d/t renal toxicity.    09/03/2014 - 10/23/2014 Radiation Therapy   Treated in [place], IMRT Alexander [name]).  Base of tongue and bilat neck. Total dose: 70 Gy in 35  fractions. (of note, he did miss several treatments requiring BID dosing towards the end of treatment).    01/25/2015 PET scan   Near complete resolution of metabolic activity at the base of tongue. Minimal residual activity is likely post treatment effect. 2. Complete resolution of metabolic activity above LEFT cervical lymph nodes. No evidence of residual metabolically active    11/18/2351 Procedure   Port-a-cath removed Alexander [name])    05/03/2018 -  Chemotherapy   The patient had palonosetron (ALOXI) injection 0.25 mg, 0.25 mg, Intravenous,  Once, 6 of 6 cycles Administration: 0.25 mg (05/03/2018), 0.25 mg (05/24/2018), 0.25 mg (06/14/2018), 0.25 mg (07/09/2018), 0.25 mg (07/30/2018), 0.25 mg (08/21/2018) pegfilgrastim-cbqv (UDENYCA) injection 6 mg, 6 mg, Subcutaneous, Once, 5 of 5 cycles Administration: 6 mg (05/27/2018), 6 mg (06/17/2018), 6 mg (07/11/2018), 6 mg (08/01/2018), 6 mg (08/23/2018) CARBOplatin (PARAPLATIN) 380 mg in sodium chloride 0.9 % 250 mL chemo infusion, 380 mg (100 % of original dose 381 mg), Intravenous,  Once, 6 of 6 cycles Dose modification:   (original dose 381 mg, Cycle 1),   (original dose 309.5 mg, Cycle 2), 307.5 mg (original dose 309.5 mg, Cycle 5) Administration: 380 mg (05/03/2018), 310 mg (05/24/2018), 310 mg (06/14/2018), 340 mg (07/09/2018), 310 mg (07/30/2018), 350 mg (08/21/2018) PACLitaxel (TAXOL) 330 mg in sodium chloride 0.9 % 500 mL chemo infusion (> 65m/m2), 175 mg/m2 = 330 mg (100 % of original dose 175 mg/m2), Intravenous,  Once, 6 of 6 cycles Dose modification: 175 mg/m2 (original dose 175 mg/m2, Cycle 1, Reason: Patient Age) Administration: 330 mg (05/03/2018), 330 mg (05/24/2018), 330  mg (06/14/2018), 330 mg (07/09/2018), 330 mg (07/30/2018), 330 mg (08/21/2018)  for chemotherapy treatment.    05/24/2018 -  Chemotherapy   The patient had pembrolizumab (KEYTRUDA) 200 mg in sodium chloride 0.9 % 50 mL chemo infusion, 200 mg, Intravenous, Once, 7 of 8 cycles Administration: 200 mg  (05/24/2018), 200 mg (06/14/2018), 200 mg (07/09/2018), 200 mg (08/21/2018), 200 mg (09/11/2018)  for chemotherapy treatment.    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 68 y.o. male seen for follow-up and next cycle of treatment.  Maintenance Keytruda was started on 09/11/2018.  Denies any skin rashes.  Denies any diarrhea.  Denies any new onset cough.  No nausea vomiting reported.  Denies fevers, night sweats or weight loss.  Denies any ER visits or hospitalizations.  He is taking Norco 5 mg for his knee pains.  He averages about 4 pills/week.  He had an injury to the left leg which also contributes to the pains.  He requests refill for the pain medicine.      REVIEW OF SYSTEMS:  Review of Systems  All other systems reviewed and are negative.    PAST MEDICAL/SURGICAL HISTORY:  Past Medical History:  Diagnosis Date  . GERD (gastroesophageal reflux disease)   . 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 Oropharyngeal cancer ([place]) 07/28/2014   dx. 3 weeks ago.- Dr. Oneal [name] center [place], [place].  Marland Kitchen 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[place]) 08/06/14   SCCa of Left BOT   Past Surgical History:  Procedure Laterality Date  . BIOPSY  01/15/2018   Procedure: BIOPSY;  Surgeon: Alexander Binder, MD;  [facility];  Service: Endoscopy;;  gastric  . COLONOSCOPY N/A 03/13/2016   Procedure: COLONOSCOPY;  Surgeon: Alexander Binder, MD;  [facility];  Service: Endoscopy;  Laterality: N/A;  2:15 PM  . ESOPHAGOGASTRODUODENOSCOPY (EGD) WITH PROPOFOL N/A 08/17/2014   Procedure: ESOPHAGOGASTRODUODENOSCOPY (EGD) WITH PROPOFOL (procedure #1);  Surgeon: Alexander Signs  Md, MD;  Location: [facility];  Service: General;  Laterality: N/A;  . ESOPHAGOGASTRODUODENOSCOPY (EGD) WITH PROPOFOL N/A 01/15/2018   Procedure: ESOPHAGOGASTRODUODENOSCOPY (EGD) WITH PROPOFOL;  Surgeon: Alexander Binder, MD;  [facility];  Service: Endoscopy;  Laterality: N/A;  9:30am  . MULTIPLE EXTRACTIONS WITH ALVEOLOPLASTY N/A 08/12/2014   Procedure: Extraction of tooth #'s 6,17,22,23,24,25,26,27 with alveoloplasty;  Surgeon: Lenn Cal, DDS;  Location: [facility];  Service: Oral Surgery;  Laterality: N/A;  . PANENDOSCOPY N/A 08/06/2014   Procedure: PANENDOSCOPY WITH BIOPSY;  Surgeon: Leta Baptist, MD;  Location: [facility];  Service: ENT;  Laterality: N/A;  . PEG PLACEMENT Left 08/17/14  . PEG PLACEMENT N/A 08/17/2014   Procedure: PERCUTANEOUS ENDOSCOPIC GASTROSTOMY (PEG) PLACEMENT (procedure #1);  Surgeon: Alexander Signs Md, MD;  Location: [facility];  Service: General;  Laterality: N/A;  . PORT-A-CATH REMOVAL Right 07/17/2016   Procedure: MINOR REMOVAL PORT-A-CATH;  Surgeon: Alexander Signs, MD;  Location[facility];  Service: General;  Laterality: Right;  . PORTACATH PLACEMENT Right 08/17/14  . PORTACATH PLACEMENT Right 08/17/2014   Procedure: INSERTION PORT-A-CATH (procedure #2);  Surgeon: Alexander Signs Md, MD;  Location[facility];  Service:  General;  Laterality: Right;  . PORTACATH PLACEMENT Left 04/26/2018   Procedure: INSERTION PORT-A-CATH (attached catheter in left subclavian);  Surgeon: Alexander Signs, MD;  Location[facility];  Service: General;  Laterality: Left;  . SAVORY DILATION N/A 01/15/2018   Procedure: SAVORY DILATION;  Surgeon: Alexander Binder, MD;  [facility];  Service: Endoscopy;  Laterality: N/A;  . VIDEO BRONCHOSCOPY WITH ENDOBRONCHIAL ULTRASOUND N/A 04/15/2018   Procedure: VIDEO BRONCHOSCOPY WITH ENDOBRONCHIAL ULTRASOUND;  Surgeon: Melrose Nakayama, MD;  Location: [facility];  Service: Thoracic;  Laterality: N/A;     SOCIAL HISTORY:  Social History    Socioeconomic History  . Marital status: Legally Separated    Spouse name: Not on file  . Number of children: 5  . Years of education: Not on file  . Highest education level: Not on file  Occupational History  . Not on file  Social Needs  . Financial resource strain: Not on file  . Food insecurity    Worry: Not on file    Inability: Not on file  . Transportation needs    Medical: Not on file    Non-medical: Not on file  Tobacco Use  . Smoking status: Former Smoker    Packs/day: 0.50    Years: 30.00    Pack years: 15.00    Quit date: 07/22/2014    Years since quitting: 4.2  . Smokeless tobacco: Never Used  Substance and Sexual Activity  . Alcohol use: Not Currently    Alcohol/week: 0.0 standard drinks    Comment: None currently (11/09/17); previously 1-2 beers on the weekend  . Drug use: No  . Sexual activity: Not on file  Lifestyle  . Physical activity    Days per week: Not on file    Minutes per session: Not on file  . Stress: Not on file  Relationships  . Social Herbalist on phone: Not on file    Gets together: Not on file    Attends religious service: Not on file    Active member of club or organization: Not on file    Attends meetings of clubs or organizations: Not on file    Relationship status: Not on file  . Intimate partner violence    Fear of current or ex partner: Not on file    Emotionally abused: Not on file    Physically abused: Not on file    Forced sexual activity: Not on file  Other Topics Concern  . Not on file  Social History Narrative  . Not on file    FAMILY HISTORY:  Family History  Problem Relation Age of Onset  . Colon cancer Neg Hx   . Gastric cancer Neg Hx   . Esophageal cancer Neg Hx     CURRENT MEDICATIONS:  Outpatient Encounter Medications as of 10/02/2018  Medication Sig  . Melatonin 10 MG TABS Take 1 tablet by mouth at bedtime.  Marland Kitchen CARBOPLATIN IV Inject into the vein every 21 ( twenty-one) days.  Marland Kitchen dexamethasone  (DECADRON) 4 MG tablet Take 2 tablets (8 mg total) by mouth daily. Start the day after chemotherapy for 2 days.  . feeding supplement, ENSURE ENLIVE, (ENSURE ENLIVE) LIQD Take 237 mLs by mouth 2 (two) times daily.  Marland Kitchen HYDROcodone-acetaminophen (NORCO/VICODIN) 5-325 MG tablet Take 1 tablet by mouth every 8 (eight) hours as needed for moderate pain.  Marland Kitchen levothyroxine (SYNTHROID) 50 MCG tablet TAKE 1 TABLET BY MOUTH DAILY BEFORE BREAKAST.  Marland Kitchen lidocaine-prilocaine (  EMLA) cream Apply to affected area once (Patient taking differently: Apply to port a cath site and cover with plastic wrap one hour prior to appointment)  . LORazepam (ATIVAN) 0.5 MG tablet Take 1 tablet (0.5 mg total) by mouth every 6 (six) hours as needed (Nausea or vomiting).  Marland Kitchen omeprazole (PRILOSEC) 20 MG capsule 1 PO 30 MINS PRIOR TO BREAKFAST. (Patient taking differently: Take 20 mg by mouth daily before breakfast. 1 PO 30 MINS PRIOR TO BREAKFAST.)  . ondansetron (ZOFRAN) 8 MG tablet Take 1 tablet (8 mg total) by mouth 2 (two) times daily as needed for refractory nausea / vomiting. Start on day 3 after chemo.  Marland Kitchen PACLitaxel (TAXOL IV) Inject into the vein every 21 ( twenty-one) days.  . Pembrolizumab (KEYTRUDA IV) Inject into the vein every 21 ( twenty-one) days.  . prochlorperazine (COMPAZINE) 10 MG tablet Take 1 tablet (10 mg total) by mouth every 6 (six) hours as needed (Nausea or vomiting).  . [DISCONTINUED] HYDROcodone-acetaminophen (NORCO/VICODIN) 5-325 MG tablet Take 1 tablet by mouth every 8 (eight) hours as needed for moderate pain.   No facility-administered encounter medications on file as of 10/02/2018.     ALLERGIES:  No Known Allergies   PHYSICAL EXAM:  ECOG Performance status: 1  Vitals:   10/02/18 0927  BP: 111/67  Pulse: 62  Resp: 18  Temp: (!) 97.1 F (36.2 C)  SpO2: 100%   Filed Weights   10/02/18 0927  Weight: 149 lb 3.2 oz (67.7 kg)    Physical Exam Vitals [name] reviewed.  Constitutional:       Appearance: Normal appearance.  Cardiovascular:     Rate and Rhythm: Normal rate and regular rhythm.     Heart sounds: Normal heart sounds.  Pulmonary:     Effort: Pulmonary effort is normal.     Breath sounds: Normal breath sounds.  Abdominal:     General: There is no distension.     Palpations: Abdomen is soft. There is no mass.  Musculoskeletal:        General: No swelling.  Skin:    General: Skin is warm.  Neurological:     General: No focal deficit present.     Mental Status: He is alert and oriented to person, place, and time.  Psychiatric:        Mood and Affect: Mood normal.        Behavior: Behavior normal.      LABORATORY DATA:  I have reviewed the labs as listed.  CBC    Component Value Date/Time   WBC 3.1 (L) 10/02/2018 0916   RBC 3.75 (L) 10/02/2018 0916   HGB 11.7 (L) 10/02/2018 0916   HCT 37.0 (L) 10/02/2018 0916   PLT 169 10/02/2018 0916   MCV 98.7 10/02/2018 0916   MCH 31.2 10/02/2018 0916   MCHC 31.6 10/02/2018 0916   RDW 16.4 (H) 10/02/2018 0916   LYMPHSABS 0.9 10/02/2018 0916   MONOABS 0.3 10/02/2018 0916   EOSABS 0.1 10/02/2018 0916   BASOSABS 0.0 10/02/2018 0916   CMP Latest Ref Rng & Units 10/02/2018 09/11/2018 08/21/2018  Glucose 70 - 99 mg/dL 117(H) 86 109(H)  BUN 8 - 23 mg/dL '15 13 16  '$ Creatinine 0.61 - 1.24 mg/dL 1.57(H) 1.53(H) 1.51(H)  Sodium 135 - 145 mmol/L 140 140 141  Potassium 3.5 - 5.1 mmol/L 4.2 4.7 4.1  Chloride 98 - 111 mmol/L 106 104 105  CO2 22 - 32 mmol/L '24 22 24  '$ Calcium 8.9 - 10.3  mg/dL 9.1 9.2 9.0  Total Protein 6.5 - 8.1 g/dL 6.8 6.8 7.0  Total Bilirubin 0.3 - 1.2 mg/dL 0.3 0.4 0.4  Alkaline Phos 38 - 126 U/L 56 67 65  AST 15 - 41 U/L 21 12(L) 15  ALT 0 - 44 U/L '18 11 11       '$ DIAGNOSTIC IMAGING:  I have independently reviewed the scans and discussed with the patient.   I have reviewed Venita Lick LPN's note and agree with the documentation.  I personally performed a face-to-face visit, made revisions and my  assessment and plan is as follows.    ASSESSMENT & PLAN:   Squamous cell lung cancer, left ([place]) 1.  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  Persistent hypermetabolic activity in the right prostate gland. -PDL 1 testing was not done.  Foundation 1 testing shows MS-[name], TMB-46mts/mb, NOTCH 1 Loss, PTEN loss, RB1 loss, FAS loss - 6 cycles of carboplatinum, paclitaxel and pembrolizumab from 05/03/2018 through 08/21/2018. -PET scan on 07/22/2018 shows reduced size and reduced metabolic activity in the thoracic adenopathy, left lower lobe pulmonary nodules.  [name] enlarged indistinctly marginated left level 2B node with SUV of 1.9.  There is increased tongue activity which is of uncertain significance.  Hypermetabolic activity in the prostate gland eccentric to the right side. -Maintenance pembrolizumab started on 09/11/2018. -He is tolerating immunotherapy very well.  He does not have any immunotherapy related side effects. -I will see him back in 3 weeks for follow-up.  I plan to repeat a PET scan prior to next visit.  2.  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 -PET scan did not show any hypermetabolic activity in the neck.  3.  Bilateral knee pains: - He takes hydrocodone 5 mg as needed, averaging up to 4 tablets/week. - We will send a refill for it.  4.  Elevated PSA: -PSA was elevated at 39.6 on 05/16/2018. -PET scan showed increased uptake in the right side of the prostate.  We will repeat PSA at next visit.     Total time spent is 25 minutes with more than 50% of the time spent face-to-face discussing and reinforcing treatment plan and coordination of care.    Orders  placed this encounter:  Orders Placed This Encounter  Procedures  . NM PET Image Restag (PS) Skull Base To Thigh  . CBC with Differential/Platelet  . Comprehensive metabolic panel  . PSA      SDerek Jack MD [phone]

## 2018-10-02 NOTE — Progress Notes (Signed)
[id number] Labs reviewed with and pt seen by Dr. Delton Coombes today and pt approved for Keytruda infusion per MD               Alexander [name] tolerated Keytruda infusion well without complaints or incident. VSS upon discharge. Pt discharged self ambulatory in satisfactory condition

## 2018-10-02 NOTE — Patient Instructions (Addendum)
[facility] at [facility] Discharge Instructions  You were seen today by Dr. Katragadda. He went over your recent lab results. He will see you back in 3 weeks for labs and follow up.   Thank you for choosing [facility] at [facility] to provide your oncology and hematology care.  To afford each patient quality time with our provider, please arrive at least 15 minutes before your scheduled appointment time.   If you have a lab appointment with the Cancer Center please come in thru the  Main Entrance and check in at the main information desk  You need to re-schedule your appointment should you arrive 10 or more minutes late.  We strive to give you quality time with our providers, and arriving late affects you and other patients whose appointments are after yours.  Also, if you no show three or more times for appointments you may be dismissed from the clinic at the providers discretion.     Again, thank you for choosing [facility].  Our hope is that these requests will decrease the amount of time that you wait before being seen by our physicians.       _____________________________________________________________  Should you have questions after your visit to [facility], please contact our office at [phone] between the hours of 8:00 a.m. and 4:30 p.m.  Voicemails left after 4:00 p.m. will not be returned until the following business day.  For prescription refill requests, have your pharmacy contact our office and allow 72 hours.    Cancer Center Support Programs:   > Cancer Support Group  2nd Tuesday of the month 1pm-2pm, Journey Room    

## 2018-10-04 ENCOUNTER — Ambulatory Visit (HOSPITAL_COMMUNITY): Payer: Medicare Other

## 2018-10-07 ENCOUNTER — Other Ambulatory Visit (HOSPITAL_COMMUNITY): Payer: Self-pay | Admitting: Nurse Practitioner

## 2018-10-14 ENCOUNTER — Other Ambulatory Visit: Payer: Self-pay

## 2018-10-14 ENCOUNTER — Encounter (HOSPITAL_COMMUNITY)
Admission: RE | Admit: 2018-10-14 | Discharge: 2018-10-14 | Disposition: A | Discharge status: Home / Self Care | Payer: Medicare Other | Source: Ambulatory Visit | Attending: Hematology | Admitting: Hematology

## 2018-10-14 DIAGNOSIS — C3492 Malignant neoplasm of unspecified part of left bronchus or lung: Secondary | ICD-10-CM | POA: Diagnosis not present

## 2018-10-14 MED ORDER — FLUDEOXYGLUCOSE F - 18 (FDG) INJECTION
8.3800 | Freq: Once | INTRAVENOUS | Status: AC | PRN
  Administered 2018-10-14: 8.38 via INTRAVENOUS

## 2018-10-16 ENCOUNTER — Other Ambulatory Visit (HOSPITAL_COMMUNITY): Payer: Self-pay | Admitting: Hematology

## 2018-10-16 DIAGNOSIS — E038 Other specified hypothyroidism: Secondary | ICD-10-CM

## 2018-10-23 ENCOUNTER — Other Ambulatory Visit (HOSPITAL_COMMUNITY): Payer: Self-pay | Admitting: Hematology

## 2018-10-23 ENCOUNTER — Inpatient Hospital Stay (HOSPITAL_BASED_OUTPATIENT_CLINIC_OR_DEPARTMENT_OTHER): Payer: Medicare Other | Admitting: Hematology

## 2018-10-23 ENCOUNTER — Inpatient Hospital Stay (HOSPITAL_COMMUNITY): Payer: Medicare Other

## 2018-10-23 ENCOUNTER — Encounter (HOSPITAL_COMMUNITY): Payer: Self-pay | Admitting: Hematology

## 2018-10-23 ENCOUNTER — Other Ambulatory Visit: Payer: Self-pay

## 2018-10-23 VITALS — BP 119/65 | HR 55 | Temp 97.8°F | Resp 18

## 2018-10-23 DIAGNOSIS — M25569 Pain in unspecified knee: Secondary | ICD-10-CM | POA: Diagnosis not present

## 2018-10-23 DIAGNOSIS — C3482 Malignant neoplasm of overlapping sites of left bronchus and lung: Secondary | ICD-10-CM

## 2018-10-23 DIAGNOSIS — Z85818 Personal history of malignant neoplasm of other sites of lip, oral cavity, and pharynx: Secondary | ICD-10-CM

## 2018-10-23 DIAGNOSIS — C109 Malignant neoplasm of oropharynx, unspecified: Secondary | ICD-10-CM

## 2018-10-23 DIAGNOSIS — Z87891 Personal history of nicotine dependence: Secondary | ICD-10-CM

## 2018-10-23 DIAGNOSIS — R972 Elevated prostate specific antigen [PSA]: Secondary | ICD-10-CM | POA: Diagnosis not present

## 2018-10-23 DIAGNOSIS — Z5112 Encounter for antineoplastic immunotherapy: Secondary | ICD-10-CM | POA: Diagnosis not present

## 2018-10-23 DIAGNOSIS — C3492 Malignant neoplasm of unspecified part of left bronchus or lung: Secondary | ICD-10-CM

## 2018-10-23 DIAGNOSIS — C01 Malignant neoplasm of base of tongue: Secondary | ICD-10-CM

## 2018-10-23 DIAGNOSIS — Z79899 Other long term (current) drug therapy: Secondary | ICD-10-CM

## 2018-10-23 DIAGNOSIS — Z9221 Personal history of antineoplastic chemotherapy: Secondary | ICD-10-CM

## 2018-10-23 LAB — CBC WITH DIFFERENTIAL/PLATELET
Abs Immature Granulocytes: 0.01 10*3/uL (ref 0.00–0.07)
Basophils Absolute: 0 10*3/uL (ref 0.0–0.1)
Basophils Relative: 0 %
Eosinophils Absolute: 0.1 10*3/uL (ref 0.0–0.5)
Eosinophils Relative: 3 %
HCT: 39.7 % (ref 39.0–52.0)
Hemoglobin: 12.9 g/dL — ABNORMAL LOW (ref 13.0–17.0)
Immature Granulocytes: 0 %
Lymphocytes Relative: 27 %
Lymphs Abs: 1.2 10*3/uL (ref 0.7–4.0)
MCH: 30.9 pg (ref 26.0–34.0)
MCHC: 32.5 g/dL (ref 30.0–36.0)
MCV: 95 fL (ref 80.0–100.0)
Monocytes Absolute: 0.5 10*3/uL (ref 0.1–1.0)
Monocytes Relative: 10 %
Neutro Abs: 2.7 10*3/uL (ref 1.7–7.7)
Neutrophils Relative %: 60 %
Platelets: 179 10*3/uL (ref 150–400)
RBC: 4.18 MIL/uL — ABNORMAL LOW (ref 4.22–5.81)
RDW: 15 % (ref 11.5–15.5)
WBC: 4.5 10*3/uL (ref 4.0–10.5)
nRBC: 0 % (ref 0.0–0.2)

## 2018-10-23 LAB — COMPREHENSIVE METABOLIC PANEL
ALT: 15 U/L (ref 0–44)
AST: 18 U/L (ref 15–41)
Albumin: 4.1 g/dL (ref 3.5–5.0)
Alkaline Phosphatase: 57 U/L (ref 38–126)
Anion gap: 10 (ref 5–15)
BUN: 21 mg/dL (ref 8–23)
CO2: 25 mmol/L (ref 22–32)
Calcium: 9.3 mg/dL (ref 8.9–10.3)
Chloride: 104 mmol/L (ref 98–111)
Creatinine, Ser: 1.88 mg/dL — ABNORMAL HIGH (ref 0.61–1.24)
GFR calc Af Amer: 41 mL/min — ABNORMAL LOW (ref >60)
GFR calc non Af Amer: 36 mL/min — ABNORMAL LOW (ref >60)
Glucose, Bld: 95 mg/dL (ref 70–99)
Potassium: 4 mmol/L (ref 3.5–5.1)
Sodium: 139 mmol/L (ref 135–145)
Total Bilirubin: 0.5 mg/dL (ref 0.3–1.2)
Total Protein: 7.5 g/dL (ref 6.5–8.1)

## 2018-10-23 MED ORDER — SODIUM CHLORIDE 0.9 % IV SOLN
200.0000 mg | Freq: Once | INTRAVENOUS | Status: AC
  Administered 2018-10-23: 12:00:00 200 mg via INTRAVENOUS
  Filled 2018-10-23: qty 8

## 2018-10-23 MED ORDER — SODIUM CHLORIDE 0.9 % IV SOLN
Freq: Once | INTRAVENOUS | Status: AC
  Administered 2018-10-23: 11:00:00 via INTRAVENOUS

## 2018-10-23 MED ORDER — SODIUM CHLORIDE 0.9% IV SOLUTION
Freq: Once | INTRAVENOUS | Status: AC
  Administered 2018-10-23: 13:00:00 via INTRAVENOUS

## 2018-10-23 MED ORDER — HEPARIN SOD (PORK) LOCK FLUSH 100 UNIT/ML IV SOLN
500.0000 [IU] | Freq: Once | INTRAVENOUS | Status: AC | PRN
  Administered 2018-10-23: 13:00:00 500 [IU]

## 2018-10-23 MED ORDER — SODIUM CHLORIDE 0.9% FLUSH
10.0000 mL | INTRAVENOUS | Status: DC | PRN
  Administered 2018-10-23: 13:00:00 10 mL
  Filled 2018-10-23: qty 10

## 2018-10-23 NOTE — Progress Notes (Signed)
Pt seen by Dr. Delton Coombes today. Labs reviewed. MD aware of creat. 1.8. VO received to administer a 557ml bolus of NS with treatment today. VS within parameters for tx.   Treatment given today per MD orders. Tolerated infusion without adverse affects. Vital signs stable. No complaints at this time. Discharged from clinic ambulatory. F/U with [facility] as scheduled.

## 2018-10-23 NOTE — Patient Instructions (Addendum)
[facility] at [facility] Discharge Instructions  You were seen today by Dr. Katragadda. He went over your recent lab results. He will see you back in 3 weeks for labs and follow up.   Thank you for choosing [facility] at [facility] to provide your oncology and hematology care.  To afford each patient quality time with our provider, please arrive at least 15 minutes before your scheduled appointment time.   If you have a lab appointment with the Cancer Center please come in thru the  Main Entrance and check in at the main information desk  You need to re-schedule your appointment should you arrive 10 or more minutes late.  We strive to give you quality time with our providers, and arriving late affects you and other patients whose appointments are after yours.  Also, if you no show three or more times for appointments you may be dismissed from the clinic at the providers discretion.     Again, thank you for choosing [facility].  Our hope is that these requests will decrease the amount of time that you wait before being seen by our physicians.       _____________________________________________________________  Should you have questions after your visit to [facility], please contact our office at [phone] between the hours of 8:00 a.m. and 4:30 p.m.  Voicemails left after 4:00 p.m. will not be returned until the following business day.  For prescription refill requests, have your pharmacy contact our office and allow 72 hours.    Cancer Center Support Programs:   > Cancer Support Group  2nd Tuesday of the month 1pm-2pm, Journey Room    

## 2018-10-23 NOTE — Assessment & Plan Note (Addendum)
1.  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  Persistent hypermetabolic activity in the right prostate gland. -PDL 1 testing was not done.  Foundation 1 testing shows MS-stable, TMB-39mts/mb, NOTCH 1 Loss, PTEN loss, RB1 loss, FAS loss - 6 cycles of carboplatinum, paclitaxel and pembrolizumab from 05/03/2018 through 08/21/2018. -PET scan on 07/22/2018 shows reduced size and reduced metabolic activity in the thoracic adenopathy, left lower lobe pulmonary nodules.  Stable enlarged indistinctly marginated left level 2B node with SUV of 1.9.  There is increased tongue activity which is of uncertain significance.  Hypermetabolic activity in the prostate gland eccentric to the right side. -Maintenance pembrolizumab started on 09/11/2018.   -We reviewed the PET scan results dated 10/14/2018 which showed slightly increased metabolic activity of the lymph nodes without major changes in the size.  New left infrahilar hypermetabolism, likely corresponding to adenopathy, cannot be measured. -Given these equivocal findings, I have recommended him to continue immunotherapy at this time. -I plan to rescan him in 2 to 3 months.  2.  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 -PET scan did not show any hypermetabolic activity in the neck.  3.  Bilateral knee pains: - He takes oxycodone 5 mg as needed, averaging 4 tablets/week.  Will give a refill.  4.  Elevated PSA: -PSA was elevated at 39.6 on 05/16/2018. -No distinct increased prostatic metabolic activity on the current PET/CT scan from 10/14/2018.

## 2018-10-23 NOTE — Progress Notes (Signed)
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   07/28/2014 Initial Diagnosis   Oropharyngeal cancer   08/03/2014 Imaging   CT CAP- L supraclavicular lymphadenopathy is not completely visualized. This is better seen on the previous neck CT from 07/27/2014. Otherwise, no evidence for metastatic disease in the chest, abdomen, or pelvis.   08/03/2014 Imaging   Bone scan- Uptake at adjacent anterior LEFT 6, 7, 8 ribs likely representing trauma/fractures. Questionable nonspecific increased tracer localization at the posterior RIGHT 8th and 9th ribs, the adjacent nature which raises a a question of trauma as well   08/06/2014 Pathology Results   Dr. Benjamine Mola- Oropharynx, biopsy, Left - INVASIVE SQUAMOUS CELL CARCINOMA.   08/12/2014 Procedure   Dr. Enrique Sack- 1. Multiple extraction of tooth numbers 6, 17, 22, 23, 24, 25, 26, and 27. 3 Quadrants of alveoloplasty   08/17/2014 Pathology Results   PORT and G-TUBE placed by Dr. Carlis Stable.   08/26/2014 PET scan   Large hypermetabolic mass in the left base of tongue. Activity extends across midline to the right base tongue. 2. Intensely hypermetabolic left cervical metastatic lymph nodes. Lymph nodes extend from the left level II position to the left supraclavi   09/01/2014 - 09/22/2014 Chemotherapy   Concurrent chemoradiation with Cisplatin 100 mg/m2 x 2 cycles with Neulasta support. Held cycle #3 d/t renal toxicity.    09/03/2014 - 10/23/2014 Radiation Therapy   Treated in [place], IMRT Isidore Moos).  Base of tongue and bilat neck. Total dose: 70 Gy in 35  fractions. (of note, he did miss several treatments requiring BID dosing towards the end of treatment).    01/25/2015 PET scan   Near complete resolution of metabolic activity at the base of tongue. Minimal residual activity is likely post treatment effect. 2. Complete resolution of metabolic activity above LEFT cervical lymph nodes. No evidence of residual metabolically active    11/18/2351 Procedure   Port-a-cath removed [name])    05/03/2018 -  Chemotherapy   The patient had palonosetron (ALOXI) injection 0.25 mg, 0.25 mg, Intravenous,  Once, 6 of 6 cycles Administration: 0.25 mg (05/03/2018), 0.25 mg (05/24/2018), 0.25 mg (06/14/2018), 0.25 mg (07/09/2018), 0.25 mg (07/30/2018), 0.25 mg (08/21/2018) pegfilgrastim-cbqv (UDENYCA) injection 6 mg, 6 mg, Subcutaneous, Once, 5 of 5 cycles Administration: 6 mg (05/27/2018), 6 mg (06/17/2018), 6 mg (07/11/2018), 6 mg (08/01/2018), 6 mg (08/23/2018) CARBOplatin (PARAPLATIN) 380 mg in sodium chloride 0.9 % 250 mL chemo infusion, 380 mg (100 % of original dose 381 mg), Intravenous,  Once, 6 of 6 cycles Dose modification:   (original dose 381 mg, Cycle 1),   (original dose 309.5 mg, Cycle 2), 307.5 mg (original dose 309.5 mg, Cycle 5) Administration: 380 mg (05/03/2018), 310 mg (05/24/2018), 310 mg (06/14/2018), 340 mg (07/09/2018), 310 mg (07/30/2018), 350 mg (08/21/2018) PACLitaxel (TAXOL) 330 mg in sodium chloride 0.9 % 500 mL chemo infusion (> 65m/m2), 175 mg/m2 = 330 mg (100 % of original dose 175 mg/m2), Intravenous,  Once, 6 of 6 cycles Dose modification: 175 mg/m2 (original dose 175 mg/m2, Cycle 1, Reason: Patient Age) Administration: 330 mg (05/03/2018), 330 mg (05/24/2018), 330  mg (06/14/2018), 330 mg (07/09/2018), 330 mg (07/30/2018), 330 mg (08/21/2018)  for chemotherapy treatment.    05/24/2018 -  Chemotherapy   The patient had pembrolizumab (KEYTRUDA) 200 mg in sodium chloride 0.9 % 50 mL chemo infusion, 200 mg, Intravenous, Once, 8 of 12 cycles Administration: 200 mg  (05/24/2018), 200 mg (06/14/2018), 200 mg (07/09/2018), 200 mg (08/21/2018), 200 mg (09/11/2018), 200 mg (10/02/2018), 200 mg (10/23/2018)  for chemotherapy treatment.    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 Boley 68 y.o. male seen for follow-up of immunotherapy.  Maintenance pembrolizumab was started on 09/11/2018.  He had a PET CT scan done on 10/14/2018.  Denies any diarrhea or dry cough.  Denies any skin rashes or itching.  No fevers or chills reported.  Denies any ER visits or hospitalizations.  He is taking oxycodone 5 mg as needed for his knee pains which is helping.  Denies any trouble with urination.  Appetite is 75%.  Energy levels are 75%.      REVIEW OF SYSTEMS:  Review of Systems  Musculoskeletal: Positive for arthralgias.  All other systems reviewed and are negative.    PAST MEDICAL/SURGICAL HISTORY:  Past Medical History:  Diagnosis Date  . GERD (gastroesophageal reflux disease)   . 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 3 weeks ago.- Dr. [facility] [place], [place].  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 BIOPSY  01/15/2018   Procedure: BIOPSY;  Surgeon: Danie Binder, MD;  [facility];  Service: Endoscopy;;  gastric  . COLONOSCOPY N/A 03/13/2016   Procedure: COLONOSCOPY;  Surgeon: Danie Binder, MD;  [facility];  Service: Endoscopy;  Laterality: N/A;  2:15 PM  . ESOPHAGOGASTRODUODENOSCOPY (EGD) WITH PROPOFOL N/A 08/17/2014   Procedure: ESOPHAGOGASTRODUODENOSCOPY (EGD) WITH PROPOFOL  (procedure #1);  Surgeon: Aviva Signs Md, MD;  Location: [facility];  Service: General;  Laterality: N/A;  . ESOPHAGOGASTRODUODENOSCOPY (EGD) WITH PROPOFOL N/A 01/15/2018   Procedure: ESOPHAGOGASTRODUODENOSCOPY (EGD) WITH PROPOFOL;  Surgeon: Danie Binder, MD;  [facility];  Service: Endoscopy;  Laterality: N/A;  9:30am  . MULTIPLE EXTRACTIONS WITH ALVEOLOPLASTY N/A 08/12/2014   Procedure: Extraction of tooth #'s 6,17,22,23,24,25,26,27 with alveoloplasty;  Surgeon: Lenn Cal, DDS;  Location: [facility];  Service: Oral Surgery;  Laterality: N/A;  . PANENDOSCOPY N/A 08/06/2014   Procedure: PANENDOSCOPY WITH BIOPSY;  Surgeon: Leta Baptist, MD;  Location: [facility];  Service: ENT;  Laterality: N/A;  . PEG PLACEMENT Left 08/17/14  . PEG PLACEMENT N/A 08/17/2014   Procedure: PERCUTANEOUS ENDOSCOPIC GASTROSTOMY (PEG) PLACEMENT (procedure #1);  Surgeon: Aviva Signs Md, MD;  Location: [facility];  Service: General;  Laterality: N/A;  . PORT-A-CATH REMOVAL Right 07/17/2016   Procedure: MINOR REMOVAL PORT-A-CATH;  Surgeon: Aviva Signs, MD;  Location: [facility];  Service: General;  Laterality: Right;  . PORTACATH PLACEMENT Right 08/17/14  . PORTACATH PLACEMENT Right 08/17/2014   Procedure: INSERTION PORT-A-CATH (procedure #2);  Surgeon: Aviva Signs Md, MD;  Location: [facility];  Service: General;  Laterality: Right;  . PORTACATH PLACEMENT Left 04/26/2018   Procedure: INSERTION PORT-A-CATH (attached catheter in left subclavian);  Surgeon: Aviva Signs, MD;  Location: [facility];  Service: General;  Laterality: Left;  . SAVORY DILATION N/A 01/15/2018   Procedure: SAVORY DILATION;  Surgeon: Danie Binder, MD;  [facility];  Service: Endoscopy;  Laterality: N/A;  . VIDEO BRONCHOSCOPY WITH ENDOBRONCHIAL ULTRASOUND N/A 04/15/2018   Procedure: VIDEO BRONCHOSCOPY WITH ENDOBRONCHIAL ULTRASOUND;  Surgeon: Melrose Nakayama, MD;  Location: [facility];  Service: Thoracic;  Laterality: N/A;      SOCIAL HISTORY:  Social History   Socioeconomic History  . Marital status: Legally Separated    Spouse name: Not on file  . Number of children: 5  . Years of education: Not on file  . Highest education level: Not on file  Occupational History  . Not on file  Social Needs  . Financial resource strain: Not on file  . Food insecurity    Worry: Not on file    Inability: Not on file  . Transportation needs    Medical: Not on file    Non-medical: Not on file  Tobacco Use  . Smoking status: Former Smoker    Packs/day: 0.50    Years: 30.00    Pack years: 15.00    Quit date: 07/22/2014    Years since quitting: 4.2  . Smokeless tobacco: Never Used  Substance and Sexual Activity  . Alcohol use: Not Currently    Alcohol/week: 0.0 standard drinks    Comment: None currently (11/09/17); previously 1-2 beers on the weekend  . Drug use: No  . Sexual activity: Not on file  Lifestyle  . Physical activity    Days per week: Not on file    Minutes per session: Not on file  . Stress: Not on file  Relationships  . Social Herbalist on phone: Not on file    Gets together: Not on file    Attends religious service: Not on file    Active member of club or organization: Not on file    Attends meetings of clubs or organizations: Not on file    Relationship status: Not on file  . Intimate partner violence    Fear of current or ex partner: Not on file    Emotionally abused: Not on file    Physically abused: Not on file    Forced sexual activity: Not on file  Other Topics Concern  . Not on file  Social History Narrative  . Not on file    FAMILY HISTORY:  Family History  Problem Relation Age of Onset  . Colon cancer Neg Hx   . Gastric cancer Neg Hx   . Esophageal cancer Neg Hx     CURRENT MEDICATIONS:  Outpatient Encounter Medications as of 10/23/2018  Medication Sig  . CARBOPLATIN IV Inject into the vein every 21 ( twenty-one) days.  . feeding supplement, ENSURE ENLIVE,  (ENSURE ENLIVE) LIQD Take 237 mLs by mouth 2 (two) times daily.  Marland Kitchen levothyroxine (SYNTHROID) 50 MCG tablet TAKE 1 TABLET BY MOUTH DAILY BEFORE BREAKAST.  Marland Kitchen lidocaine-prilocaine (EMLA) cream Apply to affected area once (Patient taking differently: Apply to port a cath site and cover with plastic wrap one hour prior to appointment)  . LORazepam (ATIVAN) 0.5 MG tablet Take 1 tablet (0.5 mg total) by mouth every 6 (six) hours as needed (Nausea or vomiting).  . Melatonin 10 MG TABS Take  1 tablet by mouth at bedtime.  Marland Kitchen omeprazole (PRILOSEC) 20 MG capsule 1 PO 30 MINS PRIOR TO BREAKFAST. (Patient taking differently: Take 20 mg by mouth daily before breakfast. 1 PO 30 MINS PRIOR TO BREAKFAST.)  . ondansetron (ZOFRAN) 8 MG tablet Take 1 tablet (8 mg total) by mouth 2 (two) times daily as needed for refractory nausea / vomiting. Start on day 3 after chemo.  Marland Kitchen PACLitaxel (TAXOL IV) Inject into the vein every 21 ( twenty-one) days.  . Pembrolizumab (KEYTRUDA IV) Inject into the vein every 21 ( twenty-one) days.  . prochlorperazine (COMPAZINE) 10 MG tablet Take 1 tablet (10 mg total) by mouth every 6 (six) hours as needed (Nausea or vomiting).  . [DISCONTINUED] dexamethasone (DECADRON) 4 MG tablet Take 2 tablets (8 mg total) by mouth daily. Start the day after chemotherapy for 2 days.  . [DISCONTINUED] HYDROcodone-acetaminophen (NORCO/VICODIN) 5-325 MG tablet Take 1 tablet by mouth every 8 (eight) hours as needed for moderate pain.   No facility-administered encounter medications on file as of 10/23/2018.     ALLERGIES:  No Known Allergies   PHYSICAL EXAM:  ECOG Performance status: 1  Vitals:   10/23/18 0908  BP: 114/74  Pulse: 73  Resp: 18  Temp: (!) 97.5 F (36.4 C)  SpO2: 99%   Filed Weights   10/23/18 0908  Weight: 150 lb 1.6 oz (68.1 kg)    Physical Exam Vitals signs reviewed.  Constitutional:      Appearance: Normal appearance.  Cardiovascular:     Rate and Rhythm: Normal rate and  regular rhythm.     Heart sounds: Normal heart sounds.  Pulmonary:     Effort: Pulmonary effort is normal.     Breath sounds: Normal breath sounds.  Abdominal:     General: There is no distension.     Palpations: Abdomen is soft. There is no mass.  Musculoskeletal:        General: No swelling.  Skin:    General: Skin is warm.  Neurological:     General: No focal deficit present.     Mental Status: He is alert and oriented to person, place, and time.  Psychiatric:        Mood and Affect: Mood normal.        Behavior: Behavior normal.      LABORATORY DATA:  I have reviewed the labs as listed.  CBC    Component Value Date/Time   WBC 4.5 10/23/2018 0902   RBC 4.18 (L) 10/23/2018 0902   HGB 12.9 (L) 10/23/2018 0902   HCT 39.7 10/23/2018 0902   PLT 179 10/23/2018 0902   MCV 95.0 10/23/2018 0902   MCH 30.9 10/23/2018 0902   MCHC 32.5 10/23/2018 0902   RDW 15.0 10/23/2018 0902   LYMPHSABS 1.2 10/23/2018 0902   MONOABS 0.5 10/23/2018 0902   EOSABS 0.1 10/23/2018 0902   BASOSABS 0.0 10/23/2018 0902   CMP Latest Ref Rng & Units 10/23/2018 10/02/2018 09/11/2018  Glucose 70 - 99 mg/dL 95 117(H) 86  BUN 8 - 23 mg/dL _0 Creatinine 0.61 - 1.24 mg/dL 1.88(H) 1.57(H) 1.53(H)  Sodium 135 - 145 mmol/L 139 140 140  Potassium 3.5 - 5.1 mmol/L 4.0 4.2 4.7  Chloride 98 - 111 mmol/L 104 106 104  CO2 22 - 32 mmol/L _1 Calcium 8.9 - 10.3 mg/dL 9.3 9.1 9.2  Total Protein 6.5 - 8.1 g/dL 7.5 6.8 6.8  Total Bilirubin 0.3 - 1.2 mg/dL 0.5  0.3 0.4  Alkaline Phos 38 - 126 U/L 57 56 67  AST 15 - 41 U/L 18 21 12(L)  ALT 0 - 44 U/L _0 DIAGNOSTIC IMAGING:  I have independently reviewed the scans and discussed with the patient.   I have reviewed Venita Lick LPN's note and agree with the documentation.  I personally performed a face-to-face visit, made revisions and my assessment and plan is as follows.    ASSESSMENT & PLAN:   Squamous cell lung cancer, left ([place])  1.  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  Persistent hypermetabolic activity in the right prostate gland. -PDL 1 testing was not done.  Foundation 1 testing shows MS-stable, TMB-15mts/mb, NOTCH 1 Loss, PTEN loss, RB1 loss, FAS loss - 6 cycles of carboplatinum, paclitaxel and pembrolizumab from 05/03/2018 through 08/21/2018. -PET scan on 07/22/2018 shows reduced size and reduced metabolic activity in the thoracic adenopathy, left lower lobe pulmonary nodules.  Stable enlarged indistinctly marginated left level 2B node with SUV of 1.9.  There is increased tongue activity which is of uncertain significance.  Hypermetabolic activity in the prostate gland eccentric to the right side. -Maintenance pembrolizumab started on 09/11/2018.   -We reviewed the PET scan results dated 10/14/2018 which showed slightly increased metabolic activity of the lymph nodes without major changes in the size.  New left infrahilar hypermetabolism, likely corresponding to adenopathy, cannot be measured. -Given these equivocal findings, I have recommended him to continue immunotherapy at this time. -I plan to rescan him in 2 to 3 months.  2.  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 -PET scan did not show any hypermetabolic activity in the neck.  3.  Bilateral knee pains: - He takes oxycodone 5 mg as needed, averaging 4 tablets/week.  Will give a refill.  4.  Elevated PSA: -PSA was elevated at 39.6 on 05/16/2018. -No distinct increased prostatic metabolic activity on the current PET/CT scan from 10/14/2018.     Total time spent is 40 minutes with more than 50% of the time spent face-to-face discussing scan results,  treatment plan, counseling and coordination of care.    Orders placed this encounter:  No orders of the defined types were placed in this encounter.     SDerek Jack MD ASt. Regis [phone]

## 2018-10-29 ENCOUNTER — Other Ambulatory Visit (HOSPITAL_COMMUNITY): Payer: Self-pay | Admitting: Hematology

## 2018-11-13 ENCOUNTER — Inpatient Hospital Stay (HOSPITAL_COMMUNITY): Payer: Medicare Other | Attending: Hematology

## 2018-11-13 ENCOUNTER — Encounter (HOSPITAL_COMMUNITY): Payer: Self-pay | Admitting: Hematology

## 2018-11-13 ENCOUNTER — Inpatient Hospital Stay (HOSPITAL_COMMUNITY): Payer: Medicare Other

## 2018-11-13 ENCOUNTER — Inpatient Hospital Stay (HOSPITAL_BASED_OUTPATIENT_CLINIC_OR_DEPARTMENT_OTHER): Payer: Medicare Other | Admitting: Hematology

## 2018-11-13 ENCOUNTER — Other Ambulatory Visit: Payer: Self-pay

## 2018-11-13 VITALS — BP 133/84 | HR 65 | Temp 97.0°F | Resp 20 | Wt 154.5 lb

## 2018-11-13 VITALS — BP 139/68 | HR 53 | Temp 97.5°F | Resp 19

## 2018-11-13 DIAGNOSIS — Z5112 Encounter for antineoplastic immunotherapy: Secondary | ICD-10-CM | POA: Diagnosis present

## 2018-11-13 DIAGNOSIS — C3482 Malignant neoplasm of overlapping sites of left bronchus and lung: Secondary | ICD-10-CM | POA: Diagnosis present

## 2018-11-13 DIAGNOSIS — C3492 Malignant neoplasm of unspecified part of left bronchus or lung: Secondary | ICD-10-CM | POA: Diagnosis not present

## 2018-11-13 DIAGNOSIS — Z79899 Other long term (current) drug therapy: Secondary | ICD-10-CM | POA: Insufficient documentation

## 2018-11-13 DIAGNOSIS — C109 Malignant neoplasm of oropharynx, unspecified: Secondary | ICD-10-CM

## 2018-11-13 DIAGNOSIS — Z85818 Personal history of malignant neoplasm of other sites of lip, oral cavity, and pharynx: Secondary | ICD-10-CM | POA: Diagnosis not present

## 2018-11-13 DIAGNOSIS — Z87891 Personal history of nicotine dependence: Secondary | ICD-10-CM | POA: Diagnosis not present

## 2018-11-13 LAB — CBC WITH DIFFERENTIAL/PLATELET
Abs Immature Granulocytes: 0.01 10*3/uL (ref 0.00–0.07)
Basophils Absolute: 0 10*3/uL (ref 0.0–0.1)
Basophils Relative: 1 %
Eosinophils Absolute: 0.1 10*3/uL (ref 0.0–0.5)
Eosinophils Relative: 3 %
HCT: 40.5 % (ref 39.0–52.0)
Hemoglobin: 12.9 g/dL — ABNORMAL LOW (ref 13.0–17.0)
Immature Granulocytes: 0 %
Lymphocytes Relative: 37 %
Lymphs Abs: 1.4 10*3/uL (ref 0.7–4.0)
MCH: 29.9 pg (ref 26.0–34.0)
MCHC: 31.9 g/dL (ref 30.0–36.0)
MCV: 94 fL (ref 80.0–100.0)
Monocytes Absolute: 0.4 10*3/uL (ref 0.1–1.0)
Monocytes Relative: 11 %
Neutro Abs: 1.8 10*3/uL (ref 1.7–7.7)
Neutrophils Relative %: 48 %
Platelets: 178 10*3/uL (ref 150–400)
RBC: 4.31 MIL/uL (ref 4.22–5.81)
RDW: 14.6 % (ref 11.5–15.5)
WBC: 3.8 10*3/uL — ABNORMAL LOW (ref 4.0–10.5)
nRBC: 0 % (ref 0.0–0.2)

## 2018-11-13 LAB — COMPREHENSIVE METABOLIC PANEL
ALT: 19 U/L (ref 0–44)
AST: 21 U/L (ref 15–41)
Albumin: 3.9 g/dL (ref 3.5–5.0)
Alkaline Phosphatase: 60 U/L (ref 38–126)
Anion gap: 12 (ref 5–15)
BUN: 15 mg/dL (ref 8–23)
CO2: 24 mmol/L (ref 22–32)
Calcium: 9.3 mg/dL (ref 8.9–10.3)
Chloride: 105 mmol/L (ref 98–111)
Creatinine, Ser: 1.81 mg/dL — ABNORMAL HIGH (ref 0.61–1.24)
GFR calc Af Amer: 43 mL/min — ABNORMAL LOW (ref >60)
GFR calc non Af Amer: 37 mL/min — ABNORMAL LOW (ref >60)
Glucose, Bld: 124 mg/dL — ABNORMAL HIGH (ref 70–99)
Potassium: 4.1 mmol/L (ref 3.5–5.1)
Sodium: 141 mmol/L (ref 135–145)
Total Bilirubin: 0.7 mg/dL (ref 0.3–1.2)
Total Protein: 7.1 g/dL (ref 6.5–8.1)

## 2018-11-13 MED ORDER — SODIUM CHLORIDE 0.9 % IV SOLN
200.0000 mg | Freq: Once | INTRAVENOUS | Status: AC
  Administered 2018-11-13: 200 mg via INTRAVENOUS
  Filled 2018-11-13: qty 8

## 2018-11-13 MED ORDER — HEPARIN SOD (PORK) LOCK FLUSH 100 UNIT/ML IV SOLN
500.0000 [IU] | Freq: Once | INTRAVENOUS | Status: AC | PRN
  Administered 2018-11-13: 500 [IU]

## 2018-11-13 MED ORDER — SODIUM CHLORIDE 0.9% FLUSH
10.0000 mL | INTRAVENOUS | Status: DC | PRN
  Administered 2018-11-13: 12:00:00 10 mL
  Filled 2018-11-13: qty 10

## 2018-11-13 MED ORDER — SODIUM CHLORIDE 0.9 % IV SOLN
Freq: Once | INTRAVENOUS | Status: AC
  Administered 2018-11-13: 11:00:00 via INTRAVENOUS

## 2018-11-13 NOTE — Patient Instructions (Signed)
[facility] at [facility] Discharge Instructions  Labs drawn from portacath today   Thank you for choosing [facility] at [facility] to provide your oncology and hematology care.  To afford each patient quality time with our provider, please arrive at least 15 minutes before your scheduled appointment time.   If you have a lab appointment with the Cancer Center please come in thru the  Main Entrance and check in at the main information desk  You need to re-schedule your appointment should you arrive 10 or more minutes late.  We strive to give you quality time with our providers, and arriving late affects you and other patients whose appointments are after yours.  Also, if you no show three or more times for appointments you may be dismissed from the clinic at the providers discretion.     Again, thank you for choosing [facility].  Our hope is that these requests will decrease the amount of time that you wait before being seen by our physicians.       _____________________________________________________________  Should you have questions after your visit to [facility], please contact our office at [phone] between the hours of 8:00 a.m. and 4:30 p.m.  Voicemails left after 4:00 p.m. will not be returned until the following business day.  For prescription refill requests, have your pharmacy contact our office and allow 72 hours.    Cancer Center Support Programs:   > Cancer Support Group  2nd Tuesday of the month 1pm-2pm, Journey Room   

## 2018-11-13 NOTE — Patient Instructions (Signed)
[facility] at [facility] Discharge Instructions  You were seen today by Dr. Katragadda. He went over your recent lab results. He will see you back in 3 weeks for labs and follow up.   Thank you for choosing [facility] at [facility] to provide your oncology and hematology care.  To afford each patient quality time with our provider, please arrive at least 15 minutes before your scheduled appointment time.   If you have a lab appointment with the Cancer Center please come in thru the  Main Entrance and check in at the main information desk  You need to re-schedule your appointment should you arrive 10 or more minutes late.  We strive to give you quality time with our providers, and arriving late affects you and other patients whose appointments are after yours.  Also, if you no show three or more times for appointments you may be dismissed from the clinic at the providers discretion.     Again, thank you for choosing [facility].  Our hope is that these requests will decrease the amount of time that you wait before being seen by our physicians.       _____________________________________________________________  Should you have questions after your visit to [facility], please contact our office at [phone] between the hours of 8:00 a.m. and 4:30 p.m.  Voicemails left after 4:00 p.m. will not be returned until the following business day.  For prescription refill requests, have your pharmacy contact our office and allow 72 hours.    Cancer Center Support Programs:   > Cancer Support Group  2nd Tuesday of the month 1pm-2pm, Journey Room    

## 2018-11-13 NOTE — Patient Instructions (Signed)
[facility] Discharge Instructions for Patients Receiving Chemotherapy  Today you received the following chemotherapy agents   To help prevent nausea and vomiting after your treatment, we encourage you to take your nausea medication   If you develop nausea and vomiting that is not controlled by your nausea medication, call the clinic.   BELOW ARE SYMPTOMS THAT SHOULD BE REPORTED IMMEDIATELY:  *FEVER GREATER THAN 100.5 F  *CHILLS WITH OR WITHOUT FEVER  NAUSEA AND VOMITING THAT IS NOT CONTROLLED WITH YOUR NAUSEA MEDICATION  *UNUSUAL SHORTNESS OF BREATH  *UNUSUAL BRUISING OR BLEEDING  TENDERNESS IN MOUTH AND THROAT WITH OR WITHOUT PRESENCE OF ULCERS  *URINARY PROBLEMS  *BOWEL PROBLEMS  UNUSUAL RASH Items with * indicate a potential emergency and should be followed up as soon as possible.  Feel free to call the clinic should you have any questions or concerns. The clinic phone number is [phone].  Please show the CHEMO ALERT CARD at check-in to the Emergency Department and triage nurse.   

## 2018-11-13 NOTE — Progress Notes (Signed)
Pt presents today for treatment and office visit with Dr. Delton Coombes. VS within parameters for treatment. Labs reviewed by Dr. Delton Coombes and creat 1.8 MD aware. Message received to proceed with treatment per ATravis LPN.   Treatment given today per MD orders. Tolerated infusion without adverse affects. Vital signs stable. No complaints at this time. Discharged from clinic ambulatory. F/U with [facility] as scheduled.

## 2018-11-14 ENCOUNTER — Other Ambulatory Visit (HOSPITAL_COMMUNITY): Payer: Self-pay | Admitting: Hematology

## 2018-11-14 DIAGNOSIS — C3492 Malignant neoplasm of unspecified part of left bronchus or lung: Secondary | ICD-10-CM

## 2018-11-17 NOTE — Assessment & Plan Note (Signed)
1.  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  Persistent hypermetabolic activity in the right prostate gland. -PDL 1 testing was not done.  Foundation 1 testing shows MS-stable, TMB-29mts/mb, NOTCH 1 Loss, PTEN loss, RB1 loss, FAS loss - 6 cycles of carboplatinum, paclitaxel and pembrolizumab from 05/03/2018 through 08/21/2018. -PET scan on 07/22/2018 shows reduced size and reduced metabolic activity in the thoracic adenopathy, left lower lobe pulmonary nodules.  Stable enlarged indistinctly marginated left level 2B node with SUV of 1.9.  There is increased tongue activity which is of uncertain significance.  Hypermetabolic activity in the prostate gland eccentric to the right side. -Maintenance pembrolizumab started on 09/11/2018.   -PET CT scan on 10/14/2018 showed slightly increased metabolic activity of the lymph nodes without major changes in size.  New left infrahilar hypermetabolism, likely corresponding to adenopathy, cannot be measured. -Given the equivocal findings, I have recommended continuing immunotherapy at this time. - He is tolerating immunotherapy very well.  I have reviewed his labs. -We will proceed with his next cycle today.  He will be seen back in 3 weeks for follow-up.  2.  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 -PET scan did not show any hypermetabolic activity in the neck.  3.  Bilateral knee pains: - He is taking hydrocodone 5 mg 1 to 2 tablets/day as needed.  We will give a refill.  4.  Elevated PSA: -PSA was elevated at 39.6 on 05/16/2018. -No distinct increased prostatic metabolic activity on the current PET/CT scan from 10/14/2018.

## 2018-11-17 NOTE — Progress Notes (Signed)
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   07/28/2014 Initial Diagnosis   Oropharyngeal cancer   08/03/2014 Imaging   CT CAP- L supraclavicular lymphadenopathy is not completely visualized. This is better seen on the previous neck CT from 07/27/2014. Otherwise, no evidence for metastatic disease in the chest, abdomen, or pelvis.   08/03/2014 Imaging   Bone scan- Uptake at adjacent anterior LEFT 6, 7, 8 ribs likely representing trauma/fractures. Questionable nonspecific increased tracer localization at the posterior RIGHT 8th and 9th ribs, the adjacent nature which raises a a question of trauma as well   08/06/2014 Pathology Results   Dr. Benjamine Mola- Oropharynx, biopsy, Left - INVASIVE SQUAMOUS CELL CARCINOMA.   08/12/2014 Procedure   Dr. Enrique Sack- 1. Multiple extraction of tooth numbers 6, 17, 22, 23, 24, 25, 26, and 27. 3 Quadrants of alveoloplasty   08/17/2014 Pathology Results   PORT and G-TUBE placed by Dr. Carlis Stable.   08/26/2014 PET scan   Large hypermetabolic mass in the left base of tongue. Activity extends across midline to the right base tongue. 2. Intensely hypermetabolic left cervical metastatic lymph nodes. Lymph nodes extend from the left level II position to the left supraclavi   09/01/2014 - 09/22/2014 Chemotherapy   Concurrent chemoradiation with Cisplatin 100 mg/m2 x 2 cycles with Neulasta support. Held cycle #3 d/t renal toxicity.    09/03/2014 - 10/23/2014 Radiation Therapy   Treated in [place], IMRT Isidore Moos).  Base of tongue and bilat neck. Total dose: 70 Gy in 35  fractions. (of note, he did miss several treatments requiring BID dosing towards the end of treatment).    01/25/2015 PET scan   Near complete resolution of metabolic activity at the base of tongue. Minimal residual activity is likely post treatment effect. 2. Complete resolution of metabolic activity above LEFT cervical lymph nodes. No evidence of residual metabolically active    11/18/2351 Procedure   Port-a-cath removed [name])    05/03/2018 -  Chemotherapy   The patient had palonosetron (ALOXI) injection 0.25 mg, 0.25 mg, Intravenous,  Once, 6 of 6 cycles Administration: 0.25 mg (05/03/2018), 0.25 mg (05/24/2018), 0.25 mg (06/14/2018), 0.25 mg (07/09/2018), 0.25 mg (07/30/2018), 0.25 mg (08/21/2018) pegfilgrastim-cbqv (UDENYCA) injection 6 mg, 6 mg, Subcutaneous, Once, 5 of 5 cycles Administration: 6 mg (05/27/2018), 6 mg (06/17/2018), 6 mg (07/11/2018), 6 mg (08/01/2018), 6 mg (08/23/2018) CARBOplatin (PARAPLATIN) 380 mg in sodium chloride 0.9 % 250 mL chemo infusion, 380 mg (100 % of original dose 381 mg), Intravenous,  Once, 6 of 6 cycles Dose modification:   (original dose 381 mg, Cycle 1),   (original dose 309.5 mg, Cycle 2), 307.5 mg (original dose 309.5 mg, Cycle 5) Administration: 380 mg (05/03/2018), 310 mg (05/24/2018), 310 mg (06/14/2018), 340 mg (07/09/2018), 310 mg (07/30/2018), 350 mg (08/21/2018) PACLitaxel (TAXOL) 330 mg in sodium chloride 0.9 % 500 mL chemo infusion (> 65m/m2), 175 mg/m2 = 330 mg (100 % of original dose 175 mg/m2), Intravenous,  Once, 6 of 6 cycles Dose modification: 175 mg/m2 (original dose 175 mg/m2, Cycle 1, Reason: Patient Age) Administration: 330 mg (05/03/2018), 330 mg (05/24/2018), 330  mg (06/14/2018), 330 mg (07/09/2018), 330 mg (07/30/2018), 330 mg (08/21/2018)  for chemotherapy treatment.    05/24/2018 -  Chemotherapy   The patient had pembrolizumab (KEYTRUDA) 200 mg in sodium chloride 0.9 % 50 mL chemo infusion, 200 mg, Intravenous, Once, 9 of 12 cycles Administration: 200 mg  (05/24/2018), 200 mg (06/14/2018), 200 mg (07/09/2018), 200 mg (08/21/2018), 200 mg (09/11/2018), 200 mg (10/02/2018), 200 mg (10/23/2018), 200 mg (11/13/2018)  for chemotherapy treatment.    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 male seen for follow-up of immunotherapy.  Denies pembrolizumab was started on 09/11/2018.  He is tolerating immunotherapy very well.  He denies any immunotherapy related side effects including dry cough, diarrhea, skin rashes.  Appetite and energy levels are reported as 100%.  Pain in the knees and hips have been chronic and well controlled with the hydrocodone 1 to 2 tablets/day.  He requests refill for his pain medication.  Denies any fevers, night sweats or weight loss in the last 2 to 3 months.  Denies any recent ER visits or hospitalizations.      REVIEW OF SYSTEMS:  Review of Systems  Musculoskeletal: Positive for arthralgias.  All other systems reviewed and are negative.    PAST MEDICAL/SURGICAL HISTORY:  Past Medical History:  Diagnosis Date  . GERD (gastroesophageal reflux disease)   . 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 3 weeks ago.- Dr. Oneal [facility] [place], [place].  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 BIOPSY  01/15/2018   Procedure: BIOPSY;  Surgeon: Danie Binder, MD;  [facility];  Service: Endoscopy;;  gastric  . COLONOSCOPY N/A 03/13/2016   Procedure: COLONOSCOPY;  Surgeon: Danie Binder, MD;  [facility];  Service: Endoscopy;  Laterality: N/A;   2:15 PM  . ESOPHAGOGASTRODUODENOSCOPY (EGD) WITH PROPOFOL N/A 08/17/2014   Procedure: ESOPHAGOGASTRODUODENOSCOPY (EGD) WITH PROPOFOL (procedure #1);  Surgeon: Aviva Signs Md, MD;  Location: [facility];  Service: General;  Laterality: N/A;  . ESOPHAGOGASTRODUODENOSCOPY (EGD) WITH PROPOFOL N/A 01/15/2018   Procedure: ESOPHAGOGASTRODUODENOSCOPY (EGD) WITH PROPOFOL;  Surgeon: Danie Binder, MD;  [facility];  Service: Endoscopy;  Laterality: N/A;  9:30am  . MULTIPLE EXTRACTIONS WITH ALVEOLOPLASTY N/A 08/12/2014   Procedure: Extraction of tooth #'s 6,17,22,23,24,25,26,27 with alveoloplasty;  Surgeon: Lenn Cal, DDS;  Location: [facility];  Service: Oral Surgery;  Laterality: N/A;  . PANENDOSCOPY N/A 08/06/2014   Procedure: PANENDOSCOPY WITH BIOPSY;  Surgeon: Leta Baptist, MD;  Location: [place];  Service: ENT;  Laterality: N/A;  . PEG PLACEMENT Left 08/17/14  . PEG PLACEMENT N/A 08/17/2014   Procedure: PERCUTANEOUS ENDOSCOPIC GASTROSTOMY (PEG) PLACEMENT (procedure #1);  Surgeon: Aviva Signs Md, MD;  Location: [facility];  Service: General;  Laterality: N/A;  . PORT-A-CATH REMOVAL Right 07/17/2016   Procedure: MINOR REMOVAL PORT-A-CATH;  Surgeon: Aviva Signs, MD;  Location: [facility];  Service: General;  Laterality: Right;  . PORTACATH PLACEMENT Right 08/17/14  . PORTACATH PLACEMENT Right 08/17/2014  Procedure: INSERTION PORT-A-CATH (procedure #2);  Surgeon: Aviva Signs Md, MD;  Location[facility];  Service: General;  Laterality: Right;  . PORTACATH PLACEMENT Left 04/26/2018   Procedure: INSERTION PORT-A-CATH (attached catheter in left subclavian);  Surgeon: Aviva Signs, MD;  Location[facility];  Service: General;  Laterality: Left;  . SAVORY DILATION N/A 01/15/2018   Procedure: SAVORY DILATION;  Surgeon: Danie Binder, MD;  [facility];  Service: Endoscopy;  Laterality: N/A;  . VIDEO BRONCHOSCOPY WITH ENDOBRONCHIAL ULTRASOUND N/A 04/15/2018   Procedure: VIDEO BRONCHOSCOPY  WITH ENDOBRONCHIAL ULTRASOUND;  Surgeon: Melrose Nakayama, MD;  Location: [facility];  Service: Thoracic;  Laterality: N/A;     SOCIAL HISTORY:  Social History   Socioeconomic History  . Marital status: Legally Separated    Spouse name: Not on file  . Number of children: 5  . Years of education: Not on file  . Highest education level: Not on file  Occupational History  . Not on file  Social Needs  . Financial resource strain: Not on file  . Food insecurity    Worry: Not on file    Inability: Not on file  . Transportation needs    Medical: Not on file    Non-medical: Not on file  Tobacco Use  . Smoking status: Former Smoker    Packs/day: 0.50    Years: 30.00    Pack years: 15.00    Quit date: 07/22/2014    Years since quitting: 4.3  . Smokeless tobacco: Never Used  Substance and Sexual Activity  . Alcohol use: Not Currently    Alcohol/week: 0.0 standard drinks    Comment: None currently (11/09/17); previously 1-2 beers on the weekend  . Drug use: No  . Sexual activity: Not on file  Lifestyle  . Physical activity    Days per week: Not on file    Minutes per session: Not on file  . Stress: Not on file  Relationships  . Social Herbalist on phone: Not on file    Gets together: Not on file    Attends religious service: Not on file    Active member of club or organization: Not on file    Attends meetings of clubs or organizations: Not on file    Relationship status: Not on file  . Intimate partner violence    Fear of current or ex partner: Not on file    Emotionally abused: Not on file    Physically abused: Not on file    Forced sexual activity: Not on file  Other Topics Concern  . Not on file  Social History Narrative  . Not on file    FAMILY HISTORY:  Family History  Problem Relation Age of Onset  . Colon cancer Neg Hx   . Gastric cancer Neg Hx   . Esophageal cancer Neg Hx     CURRENT MEDICATIONS:  Outpatient Encounter Medications as of  11/13/2018  Medication Sig  . CARBOPLATIN IV Inject into the vein every 21 ( twenty-one) days.  . feeding supplement, ENSURE ENLIVE, (ENSURE ENLIVE) LIQD Take 237 mLs by mouth 2 (two) times daily.  Marland Kitchen levothyroxine (SYNTHROID) 50 MCG tablet TAKE 1 TABLET BY MOUTH DAILY BEFORE BREAKAST.  Marland Kitchen lidocaine-prilocaine (EMLA) cream Apply to affected area once (Patient taking differently: Apply to port a cath site and cover with plastic wrap one hour prior to appointment)  . Melatonin 10 MG TABS Take 1 tablet by mouth at bedtime.  Marland Kitchen  omeprazole (PRILOSEC) 20 MG capsule 1 PO 30 MINS PRIOR TO BREAKFAST. (Patient taking differently: Take 20 mg by mouth daily before breakfast. 1 PO 30 MINS PRIOR TO BREAKFAST.)  . PACLitaxel (TAXOL IV) Inject into the vein every 21 ( twenty-one) days.  . Pembrolizumab (KEYTRUDA IV) Inject into the vein every 21 ( twenty-one) days.  . [DISCONTINUED] HYDROcodone-acetaminophen (NORCO/VICODIN) 5-325 MG tablet TAKE 1 TABLET EVERY 8 HOURS AS NEEDED FOR MODERATE PAIN  . LORazepam (ATIVAN) 0.5 MG tablet Take 1 tablet (0.5 mg total) by mouth every 6 (six) hours as needed (Nausea or vomiting). (Patient not taking: Reported on 11/13/2018)  . ondansetron (ZOFRAN) 8 MG tablet Take 1 tablet (8 mg total) by mouth 2 (two) times daily as needed for refractory nausea / vomiting. Start on day 3 after chemo. (Patient not taking: Reported on 11/13/2018)  . prochlorperazine (COMPAZINE) 10 MG tablet Take 1 tablet (10 mg total) by mouth every 6 (six) hours as needed (Nausea or vomiting). (Patient not taking: Reported on 11/13/2018)   No facility-administered encounter medications on file as of 11/13/2018.     ALLERGIES:  No Known Allergies   PHYSICAL EXAM:  ECOG Performance status: 1  Vitals:   11/13/18 0924  BP: 133/84  Pulse: 65  Resp: 20  Temp: (!) 97 F (36.1 C)  SpO2: 99%   Filed Weights   11/13/18 0924  Weight: 154 lb 8 oz (70.1 kg)    Physical Exam Vitals signs reviewed.   Constitutional:      Appearance: Normal appearance.  Cardiovascular:     Rate and Rhythm: Normal rate and regular rhythm.     Heart sounds: Normal heart sounds.  Pulmonary:     Effort: Pulmonary effort is normal.     Breath sounds: Normal breath sounds.  Abdominal:     General: There is no distension.     Palpations: Abdomen is soft. There is no mass.  Musculoskeletal:        General: No swelling.  Skin:    General: Skin is warm.  Neurological:     General: No focal deficit present.     Mental Status: He is alert and oriented to person, place, and time.  Psychiatric:        Mood and Affect: Mood normal.        Behavior: Behavior normal.      LABORATORY DATA:  I have reviewed the labs as listed.  CBC    Component Value Date/Time   WBC 3.8 (L) 11/13/2018 0935   RBC 4.31 11/13/2018 0935   HGB 12.9 (L) 11/13/2018 0935   HCT 40.5 11/13/2018 0935   PLT 178 11/13/2018 0935   MCV 94.0 11/13/2018 0935   MCH 29.9 11/13/2018 0935   MCHC 31.9 11/13/2018 0935   RDW 14.6 11/13/2018 0935   LYMPHSABS 1.4 11/13/2018 0935   MONOABS 0.4 11/13/2018 0935   EOSABS 0.1 11/13/2018 0935   BASOSABS 0.0 11/13/2018 0935   CMP Latest Ref Rng & Units 11/13/2018 10/23/2018 10/02/2018  Glucose 70 - 99 mg/dL 124(H) 95 117(H)  BUN 8 - 23 mg/dL '15 21 15  '$ Creatinine 0.61 - 1.24 mg/dL 1.81(H) 1.88(H) 1.57(H)  Sodium 135 - 145 mmol/L 141 139 140  Potassium 3.5 - 5.1 mmol/L 4.1 4.0 4.2  Chloride 98 - 111 mmol/L 105 104 106  CO2 22 - 32 mmol/L '24 25 24  '$ Calcium 8.9 - 10.3 mg/dL 9.3 9.3 9.1  Total Protein 6.5 - 8.1 g/dL 7.1 7.5 6.8  Total  Bilirubin 0.3 - 1.2 mg/dL 0.7 0.5 0.3  Alkaline Phos 38 - 126 U/L 60 57 56  AST 15 - 41 U/L '21 18 21  '$ ALT 0 - 44 U/L '19 15 18       '$ DIAGNOSTIC IMAGING:  I have independently reviewed the scans and discussed with the patient.   I have reviewed Venita Lick LPN's note and agree with the documentation.  I personally performed a face-to-face visit, made  revisions and my assessment and plan is as follows.    ASSESSMENT & PLAN:   Squamous cell lung cancer, left ([place]) 1.  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  Persistent hypermetabolic activity in the right prostate gland. -PDL 1 testing was not done.  Foundation 1 testing shows MS-stable, TMB-74mts/mb, NOTCH 1 Loss, PTEN loss, RB1 loss, FAS loss - 6 cycles of carboplatinum, paclitaxel and pembrolizumab from 05/03/2018 through 08/21/2018. -PET scan on 07/22/2018 shows reduced size and reduced metabolic activity in the thoracic adenopathy, left lower lobe pulmonary nodules.  Stable enlarged indistinctly marginated left level 2B node with SUV of 1.9.  There is increased tongue activity which is of uncertain significance.  Hypermetabolic activity in the prostate gland eccentric to the right side. -Maintenance pembrolizumab started on 09/11/2018.   -PET CT scan on 10/14/2018 showed slightly increased metabolic activity of the lymph nodes without major changes in size.  New left infrahilar hypermetabolism, likely corresponding to adenopathy, cannot be measured. -Given the equivocal findings, I have recommended continuing immunotherapy at this time. - He is tolerating immunotherapy very well.  I have reviewed his labs. -We will proceed with his next cycle today.  He will be seen back in 3 weeks for follow-up.  2.  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 -PET scan did not show any hypermetabolic activity in the neck.  3.  Bilateral knee pains: - He is taking hydrocodone 5 mg 1 to 2 tablets/day as needed.  We will give a refill.  4.  Elevated PSA: -PSA was elevated at 39.6 on 05/16/2018. -No  distinct increased prostatic metabolic activity on the current PET/CT scan from 10/14/2018.     Total time spent is 25 minutes with more than 50% of the time spent face-to-face discussing scan results, treatment plan, counseling and coordination of care.    Orders placed this encounter:  Orders Placed This Encounter  Procedures  . CBC with Differential/Platelet  . Comprehensive metabolic panel  . TSH      SDerek Jack MMille [phone]

## 2018-11-18 ENCOUNTER — Other Ambulatory Visit: Payer: Self-pay | Admitting: Gastroenterology

## 2018-11-27 ENCOUNTER — Other Ambulatory Visit (HOSPITAL_COMMUNITY): Payer: Self-pay | Admitting: Hematology

## 2018-11-27 DIAGNOSIS — E038 Other specified hypothyroidism: Secondary | ICD-10-CM

## 2018-12-04 ENCOUNTER — Encounter (HOSPITAL_COMMUNITY): Payer: Self-pay | Admitting: Hematology

## 2018-12-04 ENCOUNTER — Other Ambulatory Visit: Payer: Self-pay

## 2018-12-04 ENCOUNTER — Inpatient Hospital Stay (HOSPITAL_COMMUNITY): Payer: Medicare Other | Attending: Hematology

## 2018-12-04 ENCOUNTER — Inpatient Hospital Stay (HOSPITAL_BASED_OUTPATIENT_CLINIC_OR_DEPARTMENT_OTHER): Payer: Medicare Other | Admitting: Hematology

## 2018-12-04 ENCOUNTER — Inpatient Hospital Stay (HOSPITAL_COMMUNITY): Payer: Medicare Other

## 2018-12-04 VITALS — BP 120/71 | HR 60 | Temp 97.6°F | Resp 18

## 2018-12-04 VITALS — BP 115/70 | HR 61 | Temp 97.7°F | Resp 14 | Wt 149.8 lb

## 2018-12-04 DIAGNOSIS — M2550 Pain in unspecified joint: Secondary | ICD-10-CM | POA: Diagnosis not present

## 2018-12-04 DIAGNOSIS — M25561 Pain in right knee: Secondary | ICD-10-CM | POA: Diagnosis not present

## 2018-12-04 DIAGNOSIS — K219 Gastro-esophageal reflux disease without esophagitis: Secondary | ICD-10-CM | POA: Diagnosis not present

## 2018-12-04 DIAGNOSIS — R972 Elevated prostate specific antigen [PSA]: Secondary | ICD-10-CM | POA: Insufficient documentation

## 2018-12-04 DIAGNOSIS — Z79899 Other long term (current) drug therapy: Secondary | ICD-10-CM | POA: Insufficient documentation

## 2018-12-04 DIAGNOSIS — C109 Malignant neoplasm of oropharynx, unspecified: Secondary | ICD-10-CM | POA: Diagnosis not present

## 2018-12-04 DIAGNOSIS — Z9221 Personal history of antineoplastic chemotherapy: Secondary | ICD-10-CM | POA: Insufficient documentation

## 2018-12-04 DIAGNOSIS — R200 Anesthesia of skin: Secondary | ICD-10-CM | POA: Insufficient documentation

## 2018-12-04 DIAGNOSIS — M25562 Pain in left knee: Secondary | ICD-10-CM | POA: Diagnosis not present

## 2018-12-04 DIAGNOSIS — Z5112 Encounter for antineoplastic immunotherapy: Secondary | ICD-10-CM | POA: Insufficient documentation

## 2018-12-04 DIAGNOSIS — Z87891 Personal history of nicotine dependence: Secondary | ICD-10-CM | POA: Insufficient documentation

## 2018-12-04 DIAGNOSIS — C01 Malignant neoplasm of base of tongue: Secondary | ICD-10-CM | POA: Diagnosis present

## 2018-12-04 DIAGNOSIS — C3492 Malignant neoplasm of unspecified part of left bronchus or lung: Secondary | ICD-10-CM

## 2018-12-04 DIAGNOSIS — C3482 Malignant neoplasm of overlapping sites of left bronchus and lung: Secondary | ICD-10-CM | POA: Insufficient documentation

## 2018-12-04 LAB — CBC WITH DIFFERENTIAL/PLATELET
Abs Immature Granulocytes: 0.01 10*3/uL (ref 0.00–0.07)
Basophils Absolute: 0 10*3/uL (ref 0.0–0.1)
Basophils Relative: 1 %
Eosinophils Absolute: 0.1 10*3/uL (ref 0.0–0.5)
Eosinophils Relative: 4 %
HCT: 39.7 % (ref 39.0–52.0)
Hemoglobin: 12.8 g/dL — ABNORMAL LOW (ref 13.0–17.0)
Immature Granulocytes: 0 %
Lymphocytes Relative: 26 %
Lymphs Abs: 1 10*3/uL (ref 0.7–4.0)
MCH: 29.8 pg (ref 26.0–34.0)
MCHC: 32.2 g/dL (ref 30.0–36.0)
MCV: 92.3 fL (ref 80.0–100.0)
Monocytes Absolute: 0.5 10*3/uL (ref 0.1–1.0)
Monocytes Relative: 12 %
Neutro Abs: 2.1 10*3/uL (ref 1.7–7.7)
Neutrophils Relative %: 57 %
Platelets: 201 10*3/uL (ref 150–400)
RBC: 4.3 MIL/uL (ref 4.22–5.81)
RDW: 14.6 % (ref 11.5–15.5)
WBC: 3.7 10*3/uL — ABNORMAL LOW (ref 4.0–10.5)
nRBC: 0 % (ref 0.0–0.2)

## 2018-12-04 LAB — TSH: TSH: 12.077 u[IU]/mL — ABNORMAL HIGH (ref 0.350–4.500)

## 2018-12-04 LAB — COMPREHENSIVE METABOLIC PANEL
ALT: 21 U/L (ref 0–44)
AST: 23 U/L (ref 15–41)
Albumin: 4 g/dL (ref 3.5–5.0)
Alkaline Phosphatase: 49 U/L (ref 38–126)
Anion gap: 10 (ref 5–15)
BUN: 16 mg/dL (ref 8–23)
CO2: 26 mmol/L (ref 22–32)
Calcium: 9.2 mg/dL (ref 8.9–10.3)
Chloride: 102 mmol/L (ref 98–111)
Creatinine, Ser: 1.76 mg/dL — ABNORMAL HIGH (ref 0.61–1.24)
GFR calc Af Amer: 45 mL/min — ABNORMAL LOW (ref >60)
GFR calc non Af Amer: 39 mL/min — ABNORMAL LOW (ref >60)
Glucose, Bld: 72 mg/dL (ref 70–99)
Potassium: 4.5 mmol/L (ref 3.5–5.1)
Sodium: 138 mmol/L (ref 135–145)
Total Bilirubin: 0.6 mg/dL (ref 0.3–1.2)
Total Protein: 7.2 g/dL (ref 6.5–8.1)

## 2018-12-04 MED ORDER — HEPARIN SOD (PORK) LOCK FLUSH 100 UNIT/ML IV SOLN
500.0000 [IU] | Freq: Once | INTRAVENOUS | Status: AC | PRN
  Administered 2018-12-04: 500 [IU]

## 2018-12-04 MED ORDER — SODIUM CHLORIDE 0.9 % IV SOLN
Freq: Once | INTRAVENOUS | Status: AC
  Administered 2018-12-04: 10:00:00 via INTRAVENOUS

## 2018-12-04 MED ORDER — HYDROCODONE-ACETAMINOPHEN 5-325 MG PO TABS: ORAL_TABLET | ORAL | 0 refills | Status: DC

## 2018-12-04 MED ORDER — SODIUM CHLORIDE 0.9% FLUSH
10.0000 mL | INTRAVENOUS | Status: DC | PRN
  Administered 2018-12-04: 10:00:00 10 mL
  Filled 2018-12-04: qty 10

## 2018-12-04 MED ORDER — SODIUM CHLORIDE 0.9 % IV SOLN
200.0000 mg | Freq: Once | INTRAVENOUS | Status: AC
  Administered 2018-12-04: 11:00:00 200 mg via INTRAVENOUS
  Filled 2018-12-04: qty 8

## 2018-12-04 NOTE — Progress Notes (Signed)
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   07/28/2014 Initial Diagnosis   Oropharyngeal cancer   08/03/2014 Imaging   CT CAP- L supraclavicular lymphadenopathy is not completely visualized. This is better seen on the previous neck CT from 07/27/2014. Otherwise, no evidence for metastatic disease in the chest, abdomen, or pelvis.   08/03/2014 Imaging   Bone scan- Uptake at adjacent anterior LEFT 6, 7, 8 ribs likely representing trauma/fractures. Questionable nonspecific increased tracer localization at the posterior RIGHT 8th and 9th ribs, the adjacent nature which raises a a question of trauma as well   08/06/2014 Pathology Results   Dr. Benjamine Mola- Oropharynx, biopsy, Left - INVASIVE SQUAMOUS CELL CARCINOMA.   08/12/2014 Procedure   Dr. Enrique Sack- 1. Multiple extraction of tooth numbers 6, 17, 22, 23, 24, 25, 26, and 27. 3 Quadrants of alveoloplasty   08/17/2014 Pathology Results   PORT and G-TUBE placed by Dr. Carlis Stable.   08/26/2014 PET scan   Large hypermetabolic mass in the left base of tongue. Activity extends across midline to the right base tongue. 2. Intensely hypermetabolic left cervical metastatic lymph nodes. Lymph nodes extend from the left level II position to the left supraclavi   09/01/2014 - 09/22/2014 Chemotherapy   Concurrent chemoradiation with Cisplatin 100 mg/m2 x 2 cycles with Neulasta support. Held cycle #3 d/t renal toxicity.    09/03/2014 - 10/23/2014 Radiation Therapy   Treated in [place], IMRT Isidore Moos).  Base of tongue and bilat neck. Total dose: 70 Gy in 35  fractions. (of note, he did miss several treatments requiring BID dosing towards the end of treatment).    01/25/2015 PET scan   Near complete resolution of metabolic activity at the base of tongue. Minimal residual activity is likely post treatment effect. 2. Complete resolution of metabolic activity above LEFT cervical lymph nodes. No evidence of residual metabolically active    1/42/3953 Procedure   Port-a-cath removed [name])    05/03/2018 - 09/10/2018 Chemotherapy   The patient had palonosetron (ALOXI) injection 0.25 mg, 0.25 mg, Intravenous,  Once, 6 of 6 cycles Administration: 0.25 mg (05/03/2018), 0.25 mg (05/24/2018), 0.25 mg (06/14/2018), 0.25 mg (07/09/2018), 0.25 mg (07/30/2018), 0.25 mg (08/21/2018) pegfilgrastim-cbqv (UDENYCA) injection 6 mg, 6 mg, Subcutaneous, Once, 5 of 5 cycles Administration: 6 mg (05/27/2018), 6 mg (06/17/2018), 6 mg (07/11/2018), 6 mg (08/01/2018), 6 mg (08/23/2018) CARBOplatin (PARAPLATIN) 380 mg in sodium chloride 0.9 % 250 mL chemo infusion, 380 mg (100 % of original dose 381 mg), Intravenous,  Once, 6 of 6 cycles Dose modification:   (original dose 381 mg, Cycle 1),   (original dose 309.5 mg, Cycle 2), 307.5 mg (original dose 309.5 mg, Cycle 5) Administration: 380 mg (05/03/2018), 310 mg (05/24/2018), 310 mg (06/14/2018), 340 mg (07/09/2018), 310 mg (07/30/2018), 350 mg (08/21/2018) PACLitaxel (TAXOL) 330 mg in sodium chloride 0.9 % 500 mL chemo infusion (> 84m/m2), 175 mg/m2 = 330 mg (100 % of original dose 175 mg/m2), Intravenous,  Once, 6 of 6 cycles Dose modification: 175 mg/m2 (original dose 175 mg/m2, Cycle 1, Reason: Patient Age) Administration: 330 mg (05/03/2018), 330 mg (05/24/2018), 330  mg (06/14/2018), 330 mg (07/09/2018), 330 mg (07/30/2018), 330 mg (08/21/2018)  for chemotherapy treatment.    05/24/2018 -  Chemotherapy   The patient had pembrolizumab (KEYTRUDA) 200 mg in sodium chloride 0.9 % 50 mL chemo infusion, 200 mg, Intravenous, Once, 10 of 12 cycles  Administration: 200 mg (05/24/2018), 200 mg (06/14/2018), 200 mg (07/09/2018), 200 mg (08/21/2018), 200 mg (09/11/2018), 200 mg (10/02/2018), 200 mg (10/23/2018), 200 mg (11/13/2018), 200 mg (12/04/2018)  for chemotherapy treatment.    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 Werts 68 y.o. male seen for follow-up of lung cancer.  He is receiving pembrolizumab which was started as maintenance on 09/11/2018.  Denies any diarrhea, dry cough or skin rashes.  Appetite is 50%.  He is drinking Ensure twice a day.  Energy levels are 75%.  Pain in the knees has been stable.  He takes hydrocodone which is helping the knee pains.  He requests refill for it.    REVIEW OF SYSTEMS:  Review of Systems  Musculoskeletal: Positive for arthralgias.  All other systems reviewed and are negative.    PAST MEDICAL/SURGICAL HISTORY:  Past Medical History:  Diagnosis Date  . GERD (gastroesophageal reflux disease)   . 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 Oropharyngeal cancer ([facility] [place]) 07/28/2014   dx. 3 weeks ago.- Dr. Oneal [facility] [place], [place].  Marland Kitchen 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[facility] [place]) 08/06/14   SCCa of Left BOT   Past Surgical History:  Procedure Laterality Date  . BIOPSY  01/15/2018   Procedure: BIOPSY;  Surgeon: Danie Binder, MD;  [facility];  Service: Endoscopy;;  gastric  . COLONOSCOPY N/A 03/13/2016   Procedure: COLONOSCOPY;  Surgeon: Danie Binder, MD;  [facility];  Service: Endoscopy;  Laterality: N/A;  2:15 PM  . ESOPHAGOGASTRODUODENOSCOPY (EGD) WITH PROPOFOL N/A 08/17/2014   Procedure: ESOPHAGOGASTRODUODENOSCOPY (EGD) WITH PROPOFOL (procedure #1);   Surgeon: Aviva Signs Md, MD;  Location: [facility];  Service: General;  Laterality: N/A;  . ESOPHAGOGASTRODUODENOSCOPY (EGD) WITH PROPOFOL N/A 01/15/2018   Procedure: ESOPHAGOGASTRODUODENOSCOPY (EGD) WITH PROPOFOL;  Surgeon: Danie Binder, MD;  [facility];  Service: Endoscopy;  Laterality: N/A;  9:30am  . MULTIPLE EXTRACTIONS WITH ALVEOLOPLASTY N/A 08/12/2014   Procedure: Extraction of tooth #'s 6,17,22,23,24,25,26,27 with alveoloplasty;  Surgeon: Lenn Cal, DDS;  Location: [facility];  Service: Oral Surgery;  Laterality: N/A;  . PANENDOSCOPY N/A 08/06/2014   Procedure: PANENDOSCOPY WITH BIOPSY;  Surgeon: Leta Baptist, MD;  Location: [facility];  Service: ENT;  Laterality: N/A;  . PEG PLACEMENT Left 08/17/14  . PEG PLACEMENT N/A 08/17/2014   Procedure: PERCUTANEOUS ENDOSCOPIC GASTROSTOMY (PEG) PLACEMENT (procedure #1);  Surgeon: Aviva Signs Md, MD;  Location: [facility];  Service: General;  Laterality: N/A;  . PORT-A-CATH REMOVAL Right 07/17/2016   Procedure: MINOR REMOVAL PORT-A-CATH;  Surgeon: Aviva Signs, MD;  Location: [facility];  Service: General;  Laterality: Right;  . PORTACATH PLACEMENT Right 08/17/14  . PORTACATH PLACEMENT Right 08/17/2014   Procedure: INSERTION PORT-A-CATH (procedure #2);  Surgeon: Aviva Signs Md, MD;  Location: [facility];  Service: General;  Laterality: Right;  . PORTACATH  PLACEMENT Left 04/26/2018   Procedure: INSERTION PORT-A-CATH (attached catheter in left subclavian);  Surgeon: Aviva Signs, MD;  Location: [facility];  Service: General;  Laterality: Left;  . SAVORY DILATION N/A 01/15/2018   Procedure: SAVORY DILATION;  Surgeon: Danie Binder, MD;  [facility];  Service: Endoscopy;  Laterality: N/A;  . VIDEO BRONCHOSCOPY WITH ENDOBRONCHIAL ULTRASOUND N/A 04/15/2018   Procedure: VIDEO BRONCHOSCOPY WITH ENDOBRONCHIAL ULTRASOUND;  Surgeon: [place] Nakayama, MD;  Location: [facility];  Service: Thoracic;  Laterality: N/A;     SOCIAL HISTORY:   Social History   Socioeconomic History  . Marital status: Legally Separated    Spouse name: Not on file  . Number of children: 5  . Years of education: Not on file  . Highest education level: Not on file  Occupational History  . Not on file  Social Needs  . Financial resource strain: Not on file  . Food insecurity    Worry: Not on file    Inability: Not on file  . Transportation needs    Medical: Not on file    Non-medical: Not on file  Tobacco Use  . Smoking status: Former Smoker    Packs/day: 0.50    Years: 30.00    Pack years: 15.00    Quit date: 07/22/2014    Years since quitting: 4.3  . Smokeless tobacco: Never Used  Substance and Sexual Activity  . Alcohol use: Not Currently    Alcohol/week: 0.0 standard drinks    Comment: None currently (11/09/17); previously 1-2 beers on the weekend  . Drug use: No  . Sexual activity: Not on file  Lifestyle  . Physical activity    Days per week: Not on file    Minutes per session: Not on file  . Stress: Not on file  Relationships  . Social Herbalist on phone: Not on file    Gets together: Not on file    Attends religious service: Not on file    Active member of club or organization: Not on file    Attends meetings of clubs or organizations: Not on file    Relationship status: Not on file  . Intimate partner violence    Fear of current or ex partner: Not on file    Emotionally abused: Not on file    Physically abused: Not on file    Forced sexual activity: Not on file  Other Topics Concern  . Not on file  Social History Narrative  . Not on file    FAMILY HISTORY:  Family History  Problem Relation Age of Onset  . Colon cancer Neg Hx   . Gastric cancer Neg Hx   . Esophageal cancer Neg Hx     CURRENT MEDICATIONS:  Outpatient Encounter Medications as of 12/04/2018  Medication Sig  . CARBOPLATIN IV Inject into the vein every 21 ( twenty-one) days.  . feeding supplement, ENSURE ENLIVE, (ENSURE ENLIVE) LIQD  Take 237 mLs by mouth 2 (two) times daily.  Marland Kitchen HYDROcodone-acetaminophen (NORCO/VICODIN) 5-325 MG tablet Take 1 tablet every 12 hours as needed.  Marland Kitchen levothyroxine (SYNTHROID) 50 MCG tablet TAKE 1 TABLET BY MOUTH DAILY BEFORE BREAKAST.  . Melatonin 10 MG TABS Take 1 tablet by mouth at bedtime.  Marland Kitchen omeprazole (PRILOSEC) 20 MG capsule TAKE 1 CAPSULE BY MOUTH 30 MINUTES PRIOR TO BREAKFAST  . PACLitaxel (TAXOL IV) Inject into the vein every 21 ( twenty-one) days.  . Pembrolizumab (KEYTRUDA IV) Inject into the vein  every 21 ( twenty-one) days.  . [DISCONTINUED] HYDROcodone-acetaminophen (NORCO/VICODIN) 5-325 MG tablet TAKE 1 TABLET EVERY 8 HOURS AS NEEDED FOR MODERATE PAIN  . [DISCONTINUED] lidocaine-prilocaine (EMLA) cream Apply to affected area once (Patient taking differently: Apply to port a cath site and cover with plastic wrap one hour prior to appointment)  . [DISCONTINUED] LORazepam (ATIVAN) 0.5 MG tablet Take 1 tablet (0.5 mg total) by mouth every 6 (six) hours as needed (Nausea or vomiting). (Patient not taking: Reported on 11/13/2018)  . [DISCONTINUED] ondansetron (ZOFRAN) 8 MG tablet Take 1 tablet (8 mg total) by mouth 2 (two) times daily as needed for refractory nausea / vomiting. Start on day 3 after chemo. (Patient not taking: Reported on 11/13/2018)  . [DISCONTINUED] prochlorperazine (COMPAZINE) 10 MG tablet Take 1 tablet (10 mg total) by mouth every 6 (six) hours as needed (Nausea or vomiting). (Patient not taking: Reported on 11/13/2018)   No facility-administered encounter medications on file as of 12/04/2018.     ALLERGIES:  No Known Allergies   PHYSICAL EXAM:  ECOG Performance status: 1  Vitals:   12/04/18 0852  BP: 115/70  Pulse: 61  Resp: 14  Temp: 97.7 F (36.5 C)  SpO2: 100%   Filed Weights   12/04/18 0852  Weight: 149 lb 12.8 oz (67.9 kg)    Physical Exam Vitals signs reviewed.  Constitutional:      Appearance: Normal appearance.  Cardiovascular:     Rate and  Rhythm: Normal rate and regular rhythm.     Heart sounds: Normal heart sounds.  Pulmonary:     Effort: Pulmonary effort is normal.     Breath sounds: Normal breath sounds.  Abdominal:     General: There is no distension.     Palpations: Abdomen is soft. There is no mass.  Musculoskeletal:        General: No swelling.  Skin:    General: Skin is warm.  Neurological:     General: No focal deficit present.     Mental Status: He is alert and oriented to person, place, and time.  Psychiatric:        Mood and Affect: Mood normal.        Behavior: Behavior normal.      LABORATORY DATA:  I have reviewed the labs as listed.  CBC    Component Value Date/Time   WBC 3.7 (L) 12/04/2018 0930   RBC 4.30 12/04/2018 0930   HGB 12.8 (L) 12/04/2018 0930   HCT 39.7 12/04/2018 0930   PLT 201 12/04/2018 0930   MCV 92.3 12/04/2018 0930   MCH 29.8 12/04/2018 0930   MCHC 32.2 12/04/2018 0930   RDW 14.6 12/04/2018 0930   LYMPHSABS 1.0 12/04/2018 0930   MONOABS 0.5 12/04/2018 0930   EOSABS 0.1 12/04/2018 0930   BASOSABS 0.0 12/04/2018 0930   CMP Latest Ref Rng & Units 12/04/2018 11/13/2018 10/23/2018  Glucose 70 - 99 mg/dL 72 124(H) 95  BUN 8 - 23 mg/dL '16 15 21  '$ Creatinine 0.61 - 1.24 mg/dL 1.76(H) 1.81(H) 1.88(H)  Sodium 135 - 145 mmol/L 138 141 139  Potassium 3.5 - 5.1 mmol/L 4.5 4.1 4.0  Chloride 98 - 111 mmol/L 102 105 104  CO2 22 - 32 mmol/L '26 24 25  '$ Calcium 8.9 - 10.3 mg/dL 9.2 9.3 9.3  Total Protein 6.5 - 8.1 g/dL 7.2 7.1 7.5  Total Bilirubin 0.3 - 1.2 mg/dL 0.6 0.7 0.5  Alkaline Phos 38 - 126 U/L 49 60 57  AST 15 -  41 U/L '23 21 18  '$ ALT 0 - 44 U/L '21 19 15       '$ DIAGNOSTIC IMAGING:  I have independently reviewed the scans and discussed with the patient.   I have reviewed Venita Lick LPN's note and agree with the documentation.  I personally performed a face-to-face visit, made revisions and my assessment and plan is as follows.    ASSESSMENT & PLAN:   Squamous cell  lung cancer, left ([place]) 1.  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  Persistent hypermetabolic activity in the right prostate gland. -PDL 1 testing was not done.  Foundation 1 testing shows MS-stable, TMB-87mts/mb, NOTCH 1 Loss, PTEN loss, RB1 loss, FAS loss - 6 cycles of carboplatinum, paclitaxel and pembrolizumab from 05/03/2018 through 08/21/2018. -PET scan on 07/22/2018 shows reduced size and reduced metabolic activity in the thoracic adenopathy, left lower lobe pulmonary nodules.  Stable enlarged indistinctly marginated left level 2B node with SUV of 1.9.  There is increased tongue activity which is of uncertain significance.  Hypermetabolic activity in the prostate gland eccentric to the right side. -Maintenance pembrolizumab started on 09/11/2018.   -PET CT scan on 10/14/2018 showed slightly increased metabolic activity of the lymph nodes without major changes in size.  New left infrahilar hypermetabolism, likely corresponding to adenopathy, cannot be measured. -Given the equivocal findings, we have continued immunotherapy. - He is tolerating immunotherapy without any side effects.  We will proceed with his treatment today. -I plan to repeat PET scan in 3 months from the last scan.  2.  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 -PET scan did not show any hypermetabolic activity in the neck.  3.  Bilateral knee pains: - He is taking hydrocodone 5 mg 1 to 2 tablets/day as needed.  We will give a refill.  4.  Elevated PSA: -PSA was 39.6 on 05/16/2018.  We will repeat another PSA. -No distinct increased prostatic metabolic activity on the current PET/CT scan from 10/14/2018.     Total  time spent is 25 minutes with more than 50% of the time spent face-to-face discussing scan results, treatment plan, counseling and coordination of care.    Orders placed this encounter:  Orders Placed This Encounter  Procedures  . CBC with Differential/Platelet  . Comprehensive metabolic panel  . PSA  . TSH      SDerek Jack [phone]

## 2018-12-04 NOTE — Patient Instructions (Addendum)
[facility] at [facility] Discharge Instructions  You were seen today by Dr. Katragadda. He went over your recent lab results. He will see you back in 3 weeks for labs and follow up.   Thank you for choosing [facility] at [facility] to provide your oncology and hematology care.  To afford each patient quality time with our provider, please arrive at least 15 minutes before your scheduled appointment time.   If you have a lab appointment with the Cancer Center please come in thru the  Main Entrance and check in at the main information desk  You need to re-schedule your appointment should you arrive 10 or more minutes late.  We strive to give you quality time with our providers, and arriving late affects you and other patients whose appointments are after yours.  Also, if you no show three or more times for appointments you may be dismissed from the clinic at the providers discretion.     Again, thank you for choosing [facility].  Our hope is that these requests will decrease the amount of time that you wait before being seen by our physicians.       _____________________________________________________________  Should you have questions after your visit to [facility], please contact our office at [phone] between the hours of 8:00 a.m. and 4:30 p.m.  Voicemails left after 4:00 p.m. will not be returned until the following business day.  For prescription refill requests, have your pharmacy contact our office and allow 72 hours.    Cancer Center Support Programs:   > Cancer Support Group  2nd Tuesday of the month 1pm-2pm, Journey Room    

## 2018-12-04 NOTE — Assessment & Plan Note (Signed)
1.  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  Persistent hypermetabolic activity in the right prostate gland. -PDL 1 testing was not done.  Foundation 1 testing shows MS-stable, TMB-67mts/mb, NOTCH 1 Loss, PTEN loss, RB1 loss, FAS loss - 6 cycles of carboplatinum, paclitaxel and pembrolizumab from 05/03/2018 through 08/21/2018. -PET scan on 07/22/2018 shows reduced size and reduced metabolic activity in the thoracic adenopathy, left lower lobe pulmonary nodules.  Stable enlarged indistinctly marginated left level 2B node with SUV of 1.9.  There is increased tongue activity which is of uncertain significance.  Hypermetabolic activity in the prostate gland eccentric to the right side. -Maintenance pembrolizumab started on 09/11/2018.   -PET CT scan on 10/14/2018 showed slightly increased metabolic activity of the lymph nodes without major changes in size.  New left infrahilar hypermetabolism, likely corresponding to adenopathy, cannot be measured. -Given the equivocal findings, we have continued immunotherapy. - He is tolerating immunotherapy without any side effects.  We will proceed with his treatment today. -I plan to repeat PET scan in 3 months from the last scan.  2.  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 -PET scan did not show any hypermetabolic activity in the neck.  3.  Bilateral knee pains: - He is taking hydrocodone 5 mg 1 to 2 tablets/day as needed.  We will give a refill.  4.  Elevated PSA: -PSA was 39.6 on 05/16/2018.  We will repeat another PSA. -No distinct increased prostatic metabolic activity on the current PET/CT scan from 10/14/2018.

## 2018-12-04 NOTE — Progress Notes (Signed)
Labs reviewed with MD today. Proceed with treatment per MD.   Treatment given per orders. Patient tolerated it well without problems. Vitals stable and discharged home from clinic ambulatory. Follow up as scheduled.

## 2018-12-04 NOTE — Patient Instructions (Signed)
[facility] Discharge Instructions for Patients Receiving Chemotherapy  Today you received the following chemotherapy agents   To help prevent nausea and vomiting after your treatment, we encourage you to take your nausea medication   If you develop nausea and vomiting that is not controlled by your nausea medication, call the clinic.   BELOW ARE SYMPTOMS THAT SHOULD BE REPORTED IMMEDIATELY:  *FEVER GREATER THAN 100.5 F  *CHILLS WITH OR WITHOUT FEVER  NAUSEA AND VOMITING THAT IS NOT CONTROLLED WITH YOUR NAUSEA MEDICATION  *UNUSUAL SHORTNESS OF BREATH  *UNUSUAL BRUISING OR BLEEDING  TENDERNESS IN MOUTH AND THROAT WITH OR WITHOUT PRESENCE OF ULCERS  *URINARY PROBLEMS  *BOWEL PROBLEMS  UNUSUAL RASH Items with * indicate a potential emergency and should be followed up as soon as possible.  Feel free to call the clinic should you have any questions or concerns. The clinic phone number is [phone].  Please show the CHEMO ALERT CARD at check-in to the Emergency Department and triage nurse.   

## 2018-12-25 ENCOUNTER — Encounter (HOSPITAL_COMMUNITY): Payer: Self-pay | Admitting: Hematology

## 2018-12-25 ENCOUNTER — Inpatient Hospital Stay (HOSPITAL_BASED_OUTPATIENT_CLINIC_OR_DEPARTMENT_OTHER): Payer: Medicare Other | Admitting: Hematology

## 2018-12-25 ENCOUNTER — Inpatient Hospital Stay (HOSPITAL_COMMUNITY): Payer: Medicare Other

## 2018-12-25 ENCOUNTER — Other Ambulatory Visit (HOSPITAL_COMMUNITY): Payer: Self-pay | Admitting: *Deleted

## 2018-12-25 ENCOUNTER — Other Ambulatory Visit: Payer: Self-pay

## 2018-12-25 VITALS — BP 123/68 | HR 60 | Temp 97.9°F | Resp 18 | Wt 152.6 lb

## 2018-12-25 VITALS — BP 104/65 | HR 52 | Temp 96.9°F | Resp 18

## 2018-12-25 DIAGNOSIS — C3492 Malignant neoplasm of unspecified part of left bronchus or lung: Secondary | ICD-10-CM

## 2018-12-25 DIAGNOSIS — C109 Malignant neoplasm of oropharynx, unspecified: Secondary | ICD-10-CM

## 2018-12-25 DIAGNOSIS — Z5112 Encounter for antineoplastic immunotherapy: Secondary | ICD-10-CM | POA: Diagnosis not present

## 2018-12-25 LAB — COMPREHENSIVE METABOLIC PANEL
ALT: 22 U/L (ref 0–44)
AST: 24 U/L (ref 15–41)
Albumin: 4.1 g/dL (ref 3.5–5.0)
Alkaline Phosphatase: 53 U/L (ref 38–126)
Anion gap: 12 (ref 5–15)
BUN: 20 mg/dL (ref 8–23)
CO2: 24 mmol/L (ref 22–32)
Calcium: 9.3 mg/dL (ref 8.9–10.3)
Chloride: 105 mmol/L (ref 98–111)
Creatinine, Ser: 2 mg/dL — ABNORMAL HIGH (ref 0.61–1.24)
GFR calc Af Amer: 38 mL/min — ABNORMAL LOW (ref >60)
GFR calc non Af Amer: 33 mL/min — ABNORMAL LOW (ref >60)
Glucose, Bld: 104 mg/dL — ABNORMAL HIGH (ref 70–99)
Potassium: 3.8 mmol/L (ref 3.5–5.1)
Sodium: 141 mmol/L (ref 135–145)
Total Bilirubin: 0.4 mg/dL (ref 0.3–1.2)
Total Protein: 7.3 g/dL (ref 6.5–8.1)

## 2018-12-25 LAB — CBC WITH DIFFERENTIAL/PLATELET
Abs Immature Granulocytes: 0 10*3/uL (ref 0.00–0.07)
Basophils Absolute: 0 10*3/uL (ref 0.0–0.1)
Basophils Relative: 1 %
Eosinophils Absolute: 0.1 10*3/uL (ref 0.0–0.5)
Eosinophils Relative: 4 %
HCT: 40.3 % (ref 39.0–52.0)
Hemoglobin: 12.7 g/dL — ABNORMAL LOW (ref 13.0–17.0)
Immature Granulocytes: 0 %
Lymphocytes Relative: 41 %
Lymphs Abs: 1.5 10*3/uL (ref 0.7–4.0)
MCH: 29.3 pg (ref 26.0–34.0)
MCHC: 31.5 g/dL (ref 30.0–36.0)
MCV: 93.1 fL (ref 80.0–100.0)
Monocytes Absolute: 0.4 10*3/uL (ref 0.1–1.0)
Monocytes Relative: 11 %
Neutro Abs: 1.6 10*3/uL — ABNORMAL LOW (ref 1.7–7.7)
Neutrophils Relative %: 43 %
Platelets: 186 10*3/uL (ref 150–400)
RBC: 4.33 MIL/uL (ref 4.22–5.81)
RDW: 15.2 % (ref 11.5–15.5)
WBC: 3.6 10*3/uL — ABNORMAL LOW (ref 4.0–10.5)
nRBC: 0 % (ref 0.0–0.2)

## 2018-12-25 LAB — TSH: TSH: 8.818 u[IU]/mL — ABNORMAL HIGH (ref 0.350–4.500)

## 2018-12-25 LAB — PSA: Prostatic Specific Antigen: 30.55 ng/mL — ABNORMAL HIGH (ref 0.00–4.00)

## 2018-12-25 MED ORDER — SODIUM CHLORIDE 0.9 % IV SOLN
Freq: Once | INTRAVENOUS | Status: AC
  Administered 2018-12-25: 12:00:00 via INTRAVENOUS

## 2018-12-25 MED ORDER — HYDROCODONE-ACETAMINOPHEN 5-325 MG PO TABS: ORAL_TABLET | ORAL | 0 refills | Status: DC

## 2018-12-25 MED ORDER — HEPARIN SOD (PORK) LOCK FLUSH 100 UNIT/ML IV SOLN
500.0000 [IU] | Freq: Once | INTRAVENOUS | Status: AC | PRN
  Administered 2018-12-25: 13:00:00 500 [IU]

## 2018-12-25 MED ORDER — SODIUM CHLORIDE 0.9% IV SOLUTION
Freq: Once | INTRAVENOUS | Status: AC
  Administered 2018-12-25: 11:00:00 via INTRAVENOUS

## 2018-12-25 MED ORDER — SODIUM CHLORIDE 0.9 % IV SOLN
200.0000 mg | Freq: Once | INTRAVENOUS | Status: AC
  Administered 2018-12-25: 200 mg via INTRAVENOUS
  Filled 2018-12-25: qty 8

## 2018-12-25 MED ORDER — SODIUM CHLORIDE 0.9% FLUSH
10.0000 mL | INTRAVENOUS | Status: DC | PRN
  Administered 2018-12-25: 09:00:00 10 mL
  Filled 2018-12-25: qty 10

## 2018-12-25 NOTE — Patient Instructions (Signed)
[facility] Discharge Instructions for Patients Receiving Chemotherapy   Beginning January 23rd 2017 lab work for the [facility] will be done in the  Main lab at [facility] on 1st floor. If you have a lab appointment with the [facility] please come in thru the  Main Entrance and check in at the main information desk   Today you received the following chemotherapy agents Keytruda. Follow-up as scheduled. Call clinic for any questions or concerns  To help prevent nausea and vomiting after your treatment, we encourage you to take your nausea medication   If you develop nausea and vomiting, or diarrhea that is not controlled by your medication, call the clinic.  The clinic phone number is [phone]. Office hours are Monday-Friday 8:30am-5:00pm.  BELOW ARE SYMPTOMS THAT SHOULD BE REPORTED IMMEDIATELY:  *FEVER GREATER THAN 101.0 F  *CHILLS WITH OR WITHOUT FEVER  NAUSEA AND VOMITING THAT IS NOT CONTROLLED WITH YOUR NAUSEA MEDICATION  *UNUSUAL SHORTNESS OF BREATH  *UNUSUAL BRUISING OR BLEEDING  TENDERNESS IN MOUTH AND THROAT WITH OR WITHOUT PRESENCE OF ULCERS  *URINARY PROBLEMS  *BOWEL PROBLEMS  UNUSUAL RASH Items with * indicate a potential emergency and should be followed up as soon as possible. If you have an emergency after office hours please contact your primary care physician or go to the nearest emergency department.  Please call the clinic during office hours if you have any questions or concerns.   You may also contact the Patient Navigator at [phone] should you have any questions or need assistance in obtaining follow up care.      Resources For Cancer Patients and their Caregivers ? American Cancer Society: Can assist with transportation, wigs, general needs, runs Look Good Feel Better.        716-126-7308 ? Cancer Care: Provides financial assistance, online support groups, medication/co-pay assistance.  1-800-813-HOPE  8082814308) ? [place] Assists [place] cancer patients and their families through emotional , educational and financial support.  440-524-6985 ? [place] DSS Where to apply for food stamps, Medicaid and utility assistance. 7262395239 ? [facility]: Transportation to medical appointments. 567-140-5221 ? Social Security Administration: May apply for disability if have a Stage IV cancer. [phone] ? [employer], Disability and Transit Services: Assists with nutrition, care and transit needs. 540-009-1624

## 2018-12-25 NOTE — Progress Notes (Signed)
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   07/28/2014 Initial Diagnosis   Oropharyngeal cancer   08/03/2014 Imaging   CT CAP- L supraclavicular lymphadenopathy is not completely visualized. This is better seen on the previous neck CT from 07/27/2014. Otherwise, no evidence for metastatic disease in the chest, abdomen, or pelvis.   08/03/2014 Imaging   Bone scan- Uptake at adjacent anterior LEFT 6, 7, 8 ribs likely representing trauma/fractures. Questionable nonspecific increased tracer localization at the posterior RIGHT 8th and 9th ribs, the adjacent nature which raises a a question of trauma as well   08/06/2014 Pathology Results   Dr. Benjamine [name]- Oropharynx, biopsy, Left - INVASIVE SQUAMOUS CELL CARCINOMA.   08/12/2014 Procedure   Dr. Enrique [name]- 1. Multiple extraction of tooth numbers 6, 17, 22, 23, 24, 25, 26, and 27. 3 Quadrants of alveoloplasty   08/17/2014 Pathology Results   PORT and G-TUBE placed by Dr. Carlis [name].   08/26/2014 PET scan   Large hypermetabolic mass in the left base of tongue. Activity extends across midline to the right base tongue. 2. Intensely hypermetabolic left cervical metastatic lymph nodes. Lymph nodes extend from the left level II position to the left supraclavi   09/01/2014 - 09/22/2014 Chemotherapy   Concurrent chemoradiation with Cisplatin 100 mg/m2 x 2 cycles with Neulasta support. Held cycle #3 d/t renal toxicity.    09/03/2014 - 10/23/2014 Radiation Therapy   Treated in [place], IMRT Alexander [name]).  Base of tongue and bilat neck. Total dose: 70 Gy in 35  fractions. (of note, he did miss several treatments requiring BID dosing towards the end of treatment).    01/25/2015 PET scan   Near complete resolution of metabolic activity at the base of tongue. Minimal residual activity is likely post treatment effect. 2. Complete resolution of metabolic activity above LEFT cervical lymph nodes. No evidence of residual metabolically active    1/42/3953 Procedure   Port-a-cath removed Alexander [name])    05/03/2018 - 09/10/2018 Chemotherapy   The patient had palonosetron (ALOXI) injection 0.25 mg, 0.25 mg, Intravenous,  Once, 6 of 6 cycles Administration: 0.25 mg (05/03/2018), 0.25 mg (05/24/2018), 0.25 mg (06/14/2018), 0.25 mg (07/09/2018), 0.25 mg (07/30/2018), 0.25 mg (08/21/2018) pegfilgrastim-cbqv (UDENYCA) injection 6 mg, 6 mg, Subcutaneous, Once, 5 of 5 cycles Administration: 6 mg (05/27/2018), 6 mg (06/17/2018), 6 mg (07/11/2018), 6 mg (08/01/2018), 6 mg (08/23/2018) CARBOplatin (PARAPLATIN) 380 mg in sodium chloride 0.9 % 250 mL chemo infusion, 380 mg (100 % of original dose 381 mg), Intravenous,  Once, 6 of 6 cycles Dose modification:   (original dose 381 mg, Cycle 1),   (original dose 309.5 mg, Cycle 2), 307.5 mg (original dose 309.5 mg, Cycle 5) Administration: 380 mg (05/03/2018), 310 mg (05/24/2018), 310 mg (06/14/2018), 340 mg (07/09/2018), 310 mg (07/30/2018), 350 mg (08/21/2018) PACLitaxel (TAXOL) 330 mg in sodium chloride 0.9 % 500 mL chemo infusion (> 84m/m2), 175 mg/m2 = 330 mg (100 % of original dose 175 mg/m2), Intravenous,  Once, 6 of 6 cycles Dose modification: 175 mg/m2 (original dose 175 mg/m2, Cycle 1, Reason: Patient Age) Administration: 330 mg (05/03/2018), 330 mg (05/24/2018), 330  mg (06/14/2018), 330 mg (07/09/2018), 330 mg (07/30/2018), 330 mg (08/21/2018)  for chemotherapy treatment.    05/24/2018 -  Chemotherapy   The patient had pembrolizumab (KEYTRUDA) 200 mg in sodium chloride 0.9 % 50 mL chemo infusion, 200 mg, Intravenous, Once, 11 of 12 cycles  Administration: 200 mg (05/24/2018), 200 mg (06/14/2018), 200 mg (07/09/2018), 200 mg (08/21/2018), 200 mg (09/11/2018), 200 mg (10/02/2018), 200 mg (10/23/2018), 200 mg (11/13/2018), 200 mg (12/04/2018), 200 mg (12/25/2018)  for chemotherapy treatment.    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 male seen for follow-up of lung cancer.  He is receiving pembrolizumab maintenance since 09/11/2018.  He denies any diarrhea or dry cough.  Denies any fevers or infections.  Appetite and energy levels are 100%.  Numbness in the toes has been [name].  Arthralgias are well controlled with pain medication.    REVIEW OF SYSTEMS:  Review of Systems  Musculoskeletal: Positive for arthralgias.  Neurological: Positive for numbness.  All other systems reviewed and are negative.    PAST MEDICAL/SURGICAL HISTORY:  Past Medical History:  Diagnosis Date  . GERD (gastroesophageal reflux disease)   . 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 3 weeks ago.- Dr. Oneal [name] [place], [place].  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 BIOPSY  01/15/2018   Procedure: BIOPSY;  Surgeon: Alexander Binder, MD;  [facility];  Service: Endoscopy;;  gastric  . COLONOSCOPY N/A 03/13/2016   Procedure: COLONOSCOPY;  Surgeon: Alexander Binder, MD;  [facility];  Service: Endoscopy;  Laterality: N/A;  2:15 PM  . ESOPHAGOGASTRODUODENOSCOPY (EGD) WITH PROPOFOL N/A 08/17/2014   Procedure: ESOPHAGOGASTRODUODENOSCOPY (EGD) WITH PROPOFOL (procedure #1);   Surgeon: Alexander Signs Md, MD;  Location: [facility];  Service: General;  Laterality: N/A;  . ESOPHAGOGASTRODUODENOSCOPY (EGD) WITH PROPOFOL N/A 01/15/2018   Procedure: ESOPHAGOGASTRODUODENOSCOPY (EGD) WITH PROPOFOL;  Surgeon: Alexander Binder, MD;  [facility];  Service: Endoscopy;  Laterality: N/A;  9:30am  . MULTIPLE EXTRACTIONS WITH ALVEOLOPLASTY N/A 08/12/2014   Procedure: Extraction of tooth #'s 6,17,22,23,24,25,26,27 with alveoloplasty;  Surgeon: Lenn Cal, DDS;  Location: [facility];  Service: Oral Surgery;  Laterality: N/A;  . PANENDOSCOPY N/A 08/06/2014   Procedure: PANENDOSCOPY WITH BIOPSY;  Surgeon: Leta Baptist, MD;  Location: [facility];  Service: ENT;  Laterality: N/A;  . PEG PLACEMENT Left 08/17/14  . PEG PLACEMENT N/A 08/17/2014   Procedure: PERCUTANEOUS ENDOSCOPIC GASTROSTOMY (PEG) PLACEMENT (procedure #1);  Surgeon: Alexander Signs Md, MD;  Location: [facility];  Service: General;  Laterality: N/A;  . PORT-A-CATH REMOVAL Right 07/17/2016   Procedure: MINOR REMOVAL PORT-A-CATH;  Surgeon: Alexander Signs, MD;  Location: [facility];  Service: General;  Laterality: Right;  . PORTACATH PLACEMENT Right 08/17/14  . PORTACATH PLACEMENT Right 08/17/2014   Procedure: INSERTION PORT-A-CATH (procedure #2);  Surgeon: Alexander Signs Md, MD;  Location[facility];  Service: General;  Laterality: Right;  . PORTACATH PLACEMENT Left 04/26/2018   Procedure: INSERTION PORT-A-CATH (attached  catheter in left subclavian);  Surgeon: Alexander Signs, MD;  Location[facility];  Service: General;  Laterality: Left;  . SAVORY DILATION N/A 01/15/2018   Procedure: SAVORY DILATION;  Surgeon: Alexander Binder, MD;  [facility];  Service: Endoscopy;  Laterality: N/A;  . VIDEO BRONCHOSCOPY WITH ENDOBRONCHIAL ULTRASOUND N/A 04/15/2018   Procedure: VIDEO BRONCHOSCOPY WITH ENDOBRONCHIAL ULTRASOUND;  Surgeon: Melrose Nakayama, MD;  Location: [facility];  Service: Thoracic;  Laterality: N/A;     SOCIAL HISTORY:   Social History   Socioeconomic History  . Marital status: Legally Separated    Spouse name: Not on file  . Number of children: 5  . Years of education: Not on file  . Highest education level: Not on file  Occupational History  . Not on file  Social Needs  . Financial resource strain: Not on file  . Food insecurity    Worry: Not on file    Inability: Not on file  . Transportation needs    Medical: Not on file    Non-medical: Not on file  Tobacco Use  . Smoking status: Former Smoker    Packs/day: 0.50    Years: 30.00    Pack years: 15.00    Quit date: 07/22/2014    Years since quitting: 4.4  . Smokeless tobacco: Never Used  Substance and Sexual Activity  . Alcohol use: Not Currently    Alcohol/week: 0.0 standard drinks    Comment: None currently (11/09/17); previously 1-2 beers on the weekend  . Drug use: No  . Sexual activity: Not on file  Lifestyle  . Physical activity    Days per week: Not on file    Minutes per session: Not on file  . Stress: Not on file  Relationships  . Social Herbalist on phone: Not on file    Gets together: Not on file    Attends religious service: Not on file    Active member of club or organization: Not on file    Attends meetings of clubs or organizations: Not on file    Relationship status: Not on file  . Intimate partner violence    Fear of current or ex partner: Not on file    Emotionally abused: Not on file    Physically abused: Not on file    Forced sexual activity: Not on file  Other Topics Concern  . Not on file  Social History Narrative  . Not on file    FAMILY HISTORY:  Family History  Problem Relation Age of Onset  . Colon cancer Neg Hx   . Gastric cancer Neg Hx   . Esophageal cancer Neg Hx     CURRENT MEDICATIONS:  Outpatient Encounter Medications as of 12/25/2018  Medication Sig  . CARBOPLATIN IV Inject into the vein every 21 ( twenty-one) days.  . feeding supplement, ENSURE ENLIVE, (ENSURE ENLIVE) LIQD  Take 237 mLs by mouth 2 (two) times daily.  Marland Kitchen levothyroxine (SYNTHROID) 50 MCG tablet TAKE 1 TABLET BY MOUTH DAILY BEFORE BREAKAST.  . Melatonin 10 MG TABS Take 1 tablet by mouth at bedtime.  Marland Kitchen omeprazole (PRILOSEC) 20 MG capsule TAKE 1 CAPSULE BY MOUTH 30 MINUTES PRIOR TO BREAKFAST  . PACLitaxel (TAXOL IV) Inject into the vein every 21 ( twenty-one) days.  . Pembrolizumab (KEYTRUDA IV) Inject into the vein every 21 ( twenty-one) days.  . [DISCONTINUED] HYDROcodone-acetaminophen (NORCO/VICODIN) 5-325 MG tablet Take 1 tablet every 12 hours as needed. (Patient not taking:  Reported on 12/25/2018)  . [DISCONTINUED] prochlorperazine (COMPAZINE) 10 MG tablet Take 1 tablet (10 mg total) by mouth every 6 (six) hours as needed (Nausea or vomiting). (Patient not taking: Reported on 11/13/2018)   No facility-administered encounter medications on file as of 12/25/2018.     ALLERGIES:  No Known Allergies   PHYSICAL EXAM:  ECOG Performance status: 1  Vitals:   12/25/18 0900  BP: 123/68  Pulse: 60  Resp: 18  Temp: 97.9 F (36.6 C)  SpO2: 100%   Filed Weights   12/25/18 0900  Weight: 152 lb 9.6 oz (69.2 kg)    Physical Exam Vitals [name] reviewed.  Constitutional:      Appearance: Normal appearance.  Cardiovascular:     Rate and Rhythm: Normal rate and regular rhythm.     Heart sounds: Normal heart sounds.  Pulmonary:     Effort: Pulmonary effort is normal.     Breath sounds: Normal breath sounds.  Abdominal:     General: There is no distension.     Palpations: Abdomen is soft. There is no mass.  Musculoskeletal:        General: No swelling.  Skin:    General: Skin is warm.  Neurological:     General: No focal deficit present.     Mental Status: He is alert and oriented to person, place, and time.  Psychiatric:        Mood and Affect: Mood normal.        Behavior: Behavior normal.      LABORATORY DATA:  I have reviewed the labs as listed.  CBC    Component Value Date/Time    WBC 3.6 (L) 12/25/2018 0858   RBC 4.33 12/25/2018 0858   HGB 12.7 (L) 12/25/2018 0858   HCT 40.3 12/25/2018 0858   PLT 186 12/25/2018 0858   MCV 93.1 12/25/2018 0858   MCH 29.3 12/25/2018 0858   MCHC 31.5 12/25/2018 0858   RDW 15.2 12/25/2018 0858   LYMPHSABS 1.5 12/25/2018 0858   MONOABS 0.4 12/25/2018 0858   EOSABS 0.1 12/25/2018 0858   BASOSABS 0.0 12/25/2018 0858   CMP Latest Ref Rng & Units 12/25/2018 12/04/2018 11/13/2018  Glucose 70 - 99 mg/dL 104(H) 72 124(H)  BUN 8 - 23 mg/dL _0 Creatinine 0.61 - 1.24 mg/dL 2.00(H) 1.76(H) 1.81(H)  Sodium 135 - 145 mmol/L 141 138 141  Potassium 3.5 - 5.1 mmol/L 3.8 4.5 4.1  Chloride 98 - 111 mmol/L 105 102 105  CO2 22 - 32 mmol/L _1 Calcium 8.9 - 10.3 mg/dL 9.3 9.2 9.3  Total Protein 6.5 - 8.1 g/dL 7.3 7.2 7.1  Total Bilirubin 0.3 - 1.2 mg/dL 0.4 0.6 0.7  Alkaline Phos 38 - 126 U/L 53 49 60  AST 15 - 41 U/L _2 ALT 0 - 44 U/L _3 DIAGNOSTIC IMAGING:  I have independently reviewed the scans and discussed with the patient.   I have reviewed Venita Lick LPN's note and agree with the documentation.  I personally performed a face-to-face visit, made revisions and my assessment and plan is as follows.    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  Persistent hypermetabolic activity in the  right prostate gland. -PDL 1 testing was not done.  Foundation 1 testing shows MS-[name], TMB-33mts/mb, NOTCH 1 Loss, PTEN loss, RB1 loss, FAS loss - 6 cycles of carboplatinum, paclitaxel and pembrolizumab from 05/03/2018 through 08/21/2018. -PET scan on 07/22/2018 shows reduced size and reduced metabolic activity in the thoracic adenopathy, left lower lobe pulmonary  nodules.  [name] enlarged indistinctly marginated left level 2B node with SUV of 1.9.  There is increased tongue activity which is of uncertain significance.  Hypermetabolic activity in the prostate gland eccentric to the right side. -Maintenance pembrolizumab started on 09/11/2018.   -PET CT scan on 10/14/2018 showed slightly increased metabolic activity of the lymph nodes without major changes in size.  New left infrahilar hypermetabolism, likely corresponding to adenopathy, cannot be measured. -Given the equivocal findings, we have continued immunotherapy. - He is tolerating immunotherapy without any side effects.  We will proceed with his treatment today. -I plan to repeat PET scan in 3 months from the last scan.  2.  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 -PET scan did not show any hypermetabolic activity in the neck.  3.  Bilateral knee pains: - He is taking hydrocodone 5 mg 1 to 2 tablets/day as needed.  We will give a refill.  4.  Elevated PSA: -No distinct increased prostatic metabolic activity on the current PET/CT scan from 10/14/2018.     Total time spent is 25 minutes with more than 50% of the time spent face-to-face discussing scan results, treatment plan, counseling and coordination of care.    Orders placed this encounter:  Orders Placed This Encounter  Procedures  . NM PET Image Restag (PS) Skull Base To Thigh      SDerek Jack MD [phone]

## 2018-12-25 NOTE — Progress Notes (Signed)
1030 Labs reviewed with and pt seen by Dr. Delton Coombes and pt approved for Keytruda infusion today with NS 500 ml over 1 hr as well per MD                                                          Alexander [name] tolerated Keytruda infusion well without complaints or incident. VSS upon discharge. Pt discharged self ambulatory in satisfactory condition

## 2018-12-25 NOTE — Patient Instructions (Signed)
[place] at [facility] Discharge Instructions  You were seen today by Dr. Delton Coombes. He went over your recent lab results. He will schedule a PET scan prior to next visit. He will see you back in 4 weeks for labs and follow up.   Thank you for choosing [place] at [facility] to provide your oncology and hematology care.  To afford each patient quality time with our provider, please arrive at least 15 minutes before your scheduled appointment time.   If you have a lab appointment with the [facility] please come in thru the  Main Entrance and check in at the main information desk  You need to re-schedule your appointment should you arrive 10 or more minutes late.  We strive to give you quality time with our providers, and arriving late affects you and other patients whose appointments are after yours.  Also, if you no show three or more times for appointments you may be dismissed from the clinic at the providers discretion.     Again, thank you for choosing [facility].  Our hope is that these requests will decrease the amount of time that you wait before being seen by our physicians.       _____________________________________________________________  Should you have questions after your visit to [facility], please contact our office at [phone] between the hours of 8:00 a.m. and 4:30 p.m.  Voicemails left after 4:00 p.m. will not be returned until the following business day.  For prescription refill requests, have your pharmacy contact our office and allow 72 hours.    Cancer Center Support Programs:   > Cancer Support Group  2nd Tuesday of the month 1pm-2pm, Journey Room

## 2018-12-25 NOTE — Assessment & Plan Note (Signed)
1.  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  Persistent hypermetabolic activity in the right prostate gland. -PDL 1 testing was not done.  Foundation 1 testing shows MS-stable, TMB-57mts/mb, NOTCH 1 Loss, PTEN loss, RB1 loss, FAS loss - 6 cycles of carboplatinum, paclitaxel and pembrolizumab from 05/03/2018 through 08/21/2018. -PET scan on 07/22/2018 shows reduced size and reduced metabolic activity in the thoracic adenopathy, left lower lobe pulmonary nodules.  Stable enlarged indistinctly marginated left level 2B node with SUV of 1.9.  There is increased tongue activity which is of uncertain significance.  Hypermetabolic activity in the prostate gland eccentric to the right side. -Maintenance pembrolizumab started on 09/11/2018.   -PET CT scan on 10/14/2018 showed slightly increased metabolic activity of the lymph nodes without major changes in size.  New left infrahilar hypermetabolism, likely corresponding to adenopathy, cannot be measured. -Given the equivocal findings, we have continued immunotherapy. - He is tolerating immunotherapy without any side effects.  We will proceed with his treatment today. -I plan to repeat PET scan in 3 months from the last scan.  2.  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 -PET scan did not show any hypermetabolic activity in the neck.  3.  Bilateral knee pains: - He is taking hydrocodone 5 mg 1 to 2 tablets/day as needed.  We will give a refill.  4.  Elevated PSA: -No distinct increased prostatic metabolic activity on the current PET/CT scan from 10/14/2018.

## 2019-01-06 ENCOUNTER — Other Ambulatory Visit (HOSPITAL_COMMUNITY): Payer: Self-pay | Admitting: Hematology

## 2019-01-06 DIAGNOSIS — E038 Other specified hypothyroidism: Secondary | ICD-10-CM

## 2019-01-20 ENCOUNTER — Other Ambulatory Visit: Payer: Self-pay

## 2019-01-20 ENCOUNTER — Encounter (HOSPITAL_COMMUNITY)
Admission: RE | Admit: 2019-01-20 | Discharge: 2019-01-20 | Disposition: A | Discharge status: Home / Self Care | Payer: Medicare Other | Source: Ambulatory Visit | Attending: Hematology | Admitting: Hematology

## 2019-01-20 DIAGNOSIS — C109 Malignant neoplasm of oropharynx, unspecified: Secondary | ICD-10-CM | POA: Diagnosis not present

## 2019-01-20 MED ORDER — FLUDEOXYGLUCOSE F - 18 (FDG) INJECTION
8.6900 | Freq: Once | INTRAVENOUS | Status: AC | PRN
  Administered 2019-01-20: 8.69 via INTRAVENOUS

## 2019-01-22 ENCOUNTER — Other Ambulatory Visit: Payer: Self-pay

## 2019-01-22 ENCOUNTER — Inpatient Hospital Stay (HOSPITAL_COMMUNITY): Payer: Medicare Other

## 2019-01-22 ENCOUNTER — Inpatient Hospital Stay (HOSPITAL_COMMUNITY): Payer: Medicare Other | Attending: Hematology

## 2019-01-22 ENCOUNTER — Encounter (HOSPITAL_COMMUNITY): Payer: Self-pay | Admitting: Hematology

## 2019-01-22 ENCOUNTER — Inpatient Hospital Stay (HOSPITAL_BASED_OUTPATIENT_CLINIC_OR_DEPARTMENT_OTHER): Payer: Medicare Other | Admitting: Hematology

## 2019-01-22 VITALS — BP 116/82 | HR 68 | Temp 97.6°F | Resp 18

## 2019-01-22 DIAGNOSIS — Z87891 Personal history of nicotine dependence: Secondary | ICD-10-CM | POA: Diagnosis not present

## 2019-01-22 DIAGNOSIS — Z9221 Personal history of antineoplastic chemotherapy: Secondary | ICD-10-CM | POA: Insufficient documentation

## 2019-01-22 DIAGNOSIS — Z85818 Personal history of malignant neoplasm of other sites of lip, oral cavity, and pharynx: Secondary | ICD-10-CM | POA: Diagnosis not present

## 2019-01-22 DIAGNOSIS — C109 Malignant neoplasm of oropharynx, unspecified: Secondary | ICD-10-CM

## 2019-01-22 DIAGNOSIS — Z923 Personal history of irradiation: Secondary | ICD-10-CM | POA: Insufficient documentation

## 2019-01-22 DIAGNOSIS — Z79899 Other long term (current) drug therapy: Secondary | ICD-10-CM | POA: Insufficient documentation

## 2019-01-22 DIAGNOSIS — R972 Elevated prostate specific antigen [PSA]: Secondary | ICD-10-CM | POA: Diagnosis not present

## 2019-01-22 DIAGNOSIS — M25562 Pain in left knee: Secondary | ICD-10-CM | POA: Diagnosis not present

## 2019-01-22 DIAGNOSIS — C3492 Malignant neoplasm of unspecified part of left bronchus or lung: Secondary | ICD-10-CM

## 2019-01-22 DIAGNOSIS — K219 Gastro-esophageal reflux disease without esophagitis: Secondary | ICD-10-CM | POA: Insufficient documentation

## 2019-01-22 DIAGNOSIS — M25561 Pain in right knee: Secondary | ICD-10-CM | POA: Insufficient documentation

## 2019-01-22 DIAGNOSIS — C3482 Malignant neoplasm of overlapping sites of left bronchus and lung: Secondary | ICD-10-CM | POA: Insufficient documentation

## 2019-01-22 DIAGNOSIS — C01 Malignant neoplasm of base of tongue: Secondary | ICD-10-CM | POA: Diagnosis present

## 2019-01-22 DIAGNOSIS — Z5112 Encounter for antineoplastic immunotherapy: Secondary | ICD-10-CM | POA: Diagnosis present

## 2019-01-22 LAB — COMPREHENSIVE METABOLIC PANEL
ALT: 21 U/L (ref 0–44)
AST: 25 U/L (ref 15–41)
Albumin: 4 g/dL (ref 3.5–5.0)
Alkaline Phosphatase: 56 U/L (ref 38–126)
Anion gap: 12 (ref 5–15)
BUN: 21 mg/dL (ref 8–23)
CO2: 24 mmol/L (ref 22–32)
Calcium: 9.5 mg/dL (ref 8.9–10.3)
Chloride: 103 mmol/L (ref 98–111)
Creatinine, Ser: 2.03 mg/dL — ABNORMAL HIGH (ref 0.61–1.24)
GFR calc Af Amer: 38 mL/min — ABNORMAL LOW (ref >60)
GFR calc non Af Amer: 32 mL/min — ABNORMAL LOW (ref >60)
Glucose, Bld: 123 mg/dL — ABNORMAL HIGH (ref 70–99)
Potassium: 4.1 mmol/L (ref 3.5–5.1)
Sodium: 139 mmol/L (ref 135–145)
Total Bilirubin: 0.9 mg/dL (ref 0.3–1.2)
Total Protein: 7.2 g/dL (ref 6.5–8.1)

## 2019-01-22 LAB — PSA: Prostatic Specific Antigen: 28.62 ng/mL — ABNORMAL HIGH (ref 0.00–4.00)

## 2019-01-22 LAB — CBC WITH DIFFERENTIAL/PLATELET
Abs Immature Granulocytes: 0.01 10*3/uL (ref 0.00–0.07)
Basophils Absolute: 0 10*3/uL (ref 0.0–0.1)
Basophils Relative: 0 %
Eosinophils Absolute: 0.2 10*3/uL (ref 0.0–0.5)
Eosinophils Relative: 3 %
HCT: 38.6 % — ABNORMAL LOW (ref 39.0–52.0)
Hemoglobin: 12.5 g/dL — ABNORMAL LOW (ref 13.0–17.0)
Immature Granulocytes: 0 %
Lymphocytes Relative: 26 %
Lymphs Abs: 1.5 10*3/uL (ref 0.7–4.0)
MCH: 30 pg (ref 26.0–34.0)
MCHC: 32.4 g/dL (ref 30.0–36.0)
MCV: 92.6 fL (ref 80.0–100.0)
Monocytes Absolute: 0.6 10*3/uL (ref 0.1–1.0)
Monocytes Relative: 11 %
Neutro Abs: 3.4 10*3/uL (ref 1.7–7.7)
Neutrophils Relative %: 60 %
Platelets: 186 10*3/uL (ref 150–400)
RBC: 4.17 MIL/uL — ABNORMAL LOW (ref 4.22–5.81)
RDW: 16.2 % — ABNORMAL HIGH (ref 11.5–15.5)
WBC: 5.6 10*3/uL (ref 4.0–10.5)
nRBC: 0 % (ref 0.0–0.2)

## 2019-01-22 LAB — TSH: TSH: 6.982 u[IU]/mL — ABNORMAL HIGH (ref 0.350–4.500)

## 2019-01-22 MED ORDER — SODIUM CHLORIDE 0.9 % IV SOLN
200.0000 mg | Freq: Once | INTRAVENOUS | Status: AC
  Administered 2019-01-22: 200 mg via INTRAVENOUS
  Filled 2019-01-22: qty 8

## 2019-01-22 MED ORDER — HEPARIN SOD (PORK) LOCK FLUSH 100 UNIT/ML IV SOLN
500.0000 [IU] | Freq: Once | INTRAVENOUS | Status: AC | PRN
  Administered 2019-01-22: 500 [IU]

## 2019-01-22 MED ORDER — SODIUM CHLORIDE 0.9 % IV SOLN
Freq: Once | INTRAVENOUS | Status: AC
  Administered 2019-01-22: 10:00:00 via INTRAVENOUS

## 2019-01-22 MED ORDER — SODIUM CHLORIDE 0.9% FLUSH
10.0000 mL | INTRAVENOUS | Status: DC | PRN
  Administered 2019-01-22: 09:00:00 10 mL
  Filled 2019-01-22: qty 10

## 2019-01-22 NOTE — Patient Instructions (Signed)
[facility] Discharge Instructions for Patients Receiving Chemotherapy   Beginning January 23rd 2017 lab work for the [facility] will be done in the  Main lab at [facility] on 1st floor. If you have a lab appointment with the [place] please come in thru the  Main Entrance and check in at the main information desk   Today you received the following chemotherapy agents Keytruda. Follow-up as scheduled. Call clinic for any questions or concerns  To help prevent nausea and vomiting after your treatment, we encourage you to take your nausea medication   If you develop nausea and vomiting, or diarrhea that is not controlled by your medication, call the clinic.  The clinic phone number is [phone]. Office hours are Monday-Friday 8:30am-5:00pm.  BELOW ARE SYMPTOMS THAT SHOULD BE REPORTED IMMEDIATELY:  *FEVER GREATER THAN 101.0 F  *CHILLS WITH OR WITHOUT FEVER  NAUSEA AND VOMITING THAT IS NOT CONTROLLED WITH YOUR NAUSEA MEDICATION  *UNUSUAL SHORTNESS OF BREATH  *UNUSUAL BRUISING OR BLEEDING  TENDERNESS IN MOUTH AND THROAT WITH OR WITHOUT PRESENCE OF ULCERS  *URINARY PROBLEMS  *BOWEL PROBLEMS  UNUSUAL RASH Items with * indicate a potential emergency and should be followed up as soon as possible. If you have an emergency after office hours please contact your primary care physician or go to the nearest emergency department.  Please call the clinic during office hours if you have any questions or concerns.   You may also contact the Patient Navigator at [phone] should you have any questions or need assistance in obtaining follow up care.      Resources For Cancer Patients and their Caregivers ? American Cancer Society: Can assist with transportation, wigs, general needs, runs Look Good Feel Better.        4427673101 ? Cancer Care: Provides financial assistance, online support groups, medication/co-pay assistance.  1-800-813-HOPE  8310854280) ? [place] Assists [place] cancer patients and their families through emotional , educational and financial support.  (517) 085-7804 ? [place] DSS Where to apply for food stamps, Medicaid and utility assistance. 236-047-8179 ? [facility]: Transportation to medical appointments. 480-485-1055 ? Social Security Administration: May apply for disability if have a Stage IV cancer. (878)567-9914 [phone])649-3086 ? [employer], Disability and Transit Services: Assists with nutrition, care and transit needs. [phone]

## 2019-01-22 NOTE — Progress Notes (Signed)
1010 Labs reviewed with and pt seen by Dr. Delton Coombes and pt approved for Keytruda infusion today per MD               Alexander [name] tolerated Keytruda infusion well without complaints or incident. VSS upon discharge. Pt discharged self ambulatory in satisfactory condition

## 2019-01-23 ENCOUNTER — Other Ambulatory Visit (HOSPITAL_COMMUNITY): Payer: Self-pay | Admitting: Surgery

## 2019-01-23 ENCOUNTER — Encounter (HOSPITAL_COMMUNITY): Payer: Self-pay | Admitting: Hematology

## 2019-01-23 DIAGNOSIS — C3492 Malignant neoplasm of unspecified part of left bronchus or lung: Secondary | ICD-10-CM

## 2019-01-23 MED ORDER — HYDROCODONE-ACETAMINOPHEN 5-325 MG PO TABS: ORAL_TABLET | ORAL | 0 refills | Status: DC

## 2019-01-23 NOTE — Assessment & Plan Note (Signed)
1.  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  Persistent hypermetabolic activity in the right prostate gland. -PDL 1 testing was not done.  Foundation 1 testing shows MS-stable, TMB-69mts/mb, NOTCH 1 Loss, PTEN loss, RB1 loss, FAS loss - 6 cycles of carboplatinum, paclitaxel and pembrolizumab from 05/03/2018 through 08/21/2018. -Maintenance pembrolizumab started on 09/11/2018.   -We reviewed results of the PET scan dated 01/20/2019 which showed response to therapy of thoracic nodal metastasis.  No new metastatic disease. -He is continuing to tolerate pembrolizumab without any major immunotherapy related side effects. -I reviewed his labs.  He will proceed with next cycle.  We will reevaluate him in 3 weeks for toxicity assessment.  2.  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 -PET scan did not show any hypermetabolic activity in the neck.  3.  Bilateral knee pains: - He is taking hydrocodone 5 mg 1 to 2 tablets/day as needed.  We will give a refill.  4.  Elevated PSA: -No distinct increased prostatic metabolic activity on the current PET/CT scan from 10/14/2018.

## 2019-01-23 NOTE — Progress Notes (Signed)
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   07/28/2014 Initial Diagnosis   Oropharyngeal cancer   08/03/2014 Imaging   CT CAP- L supraclavicular lymphadenopathy is not completely visualized. This is better seen on the previous neck CT from 07/27/2014. Otherwise, no evidence for metastatic disease in the chest, abdomen, or pelvis.   08/03/2014 Imaging   Bone scan- Uptake at adjacent anterior LEFT 6, 7, 8 ribs likely representing trauma/fractures. Questionable nonspecific increased tracer localization at the posterior RIGHT 8th and 9th ribs, the adjacent nature which raises a a question of trauma as well   08/06/2014 Pathology Results   Dr. Benjamine Mola- Oropharynx, biopsy, Left - INVASIVE SQUAMOUS CELL CARCINOMA.   08/12/2014 Procedure   Dr. Enrique Sack- 1. Multiple extraction of tooth numbers 6, 17, 22, 23, 24, 25, 26, and 27. 3 Quadrants of alveoloplasty   08/17/2014 Pathology Results   PORT and G-TUBE placed by Dr. Carlis Stable.   08/26/2014 PET scan   Large hypermetabolic mass in the left base of tongue. Activity extends across midline to the right base tongue. 2. Intensely hypermetabolic left cervical metastatic lymph nodes. Lymph nodes extend from the left level II position to the left supraclavi   09/01/2014 - 09/22/2014 Chemotherapy   Concurrent chemoradiation with Cisplatin 100 mg/m2 x 2 cycles with Neulasta support. Held cycle #3 d/t renal toxicity.    09/03/2014 - 10/23/2014 Radiation Therapy   Treated in [place], IMRT Isidore Moos).  Base of tongue and bilat neck. Total dose: 70 Gy in 35  fractions. (of note, he did miss several treatments requiring BID dosing towards the end of treatment).    01/25/2015 PET scan   Near complete resolution of metabolic activity at the base of tongue. Minimal residual activity is likely post treatment effect. 2. Complete resolution of metabolic activity above LEFT cervical lymph nodes. No evidence of residual metabolically active    1/42/3953 Procedure   Port-a-cath removed [name])    05/03/2018 - 09/10/2018 Chemotherapy   The patient had palonosetron (ALOXI) injection 0.25 mg, 0.25 mg, Intravenous,  Once, 6 of 6 cycles Administration: 0.25 mg (05/03/2018), 0.25 mg (05/24/2018), 0.25 mg (06/14/2018), 0.25 mg (07/09/2018), 0.25 mg (07/30/2018), 0.25 mg (08/21/2018) pegfilgrastim-cbqv (UDENYCA) injection 6 mg, 6 mg, Subcutaneous, Once, 5 of 5 cycles Administration: 6 mg (05/27/2018), 6 mg (06/17/2018), 6 mg (07/11/2018), 6 mg (08/01/2018), 6 mg (08/23/2018) CARBOplatin (PARAPLATIN) 380 mg in sodium chloride 0.9 % 250 mL chemo infusion, 380 mg (100 % of original dose 381 mg), Intravenous,  Once, 6 of 6 cycles Dose modification:   (original dose 381 mg, Cycle 1),   (original dose 309.5 mg, Cycle 2), 307.5 mg (original dose 309.5 mg, Cycle 5) Administration: 380 mg (05/03/2018), 310 mg (05/24/2018), 310 mg (06/14/2018), 340 mg (07/09/2018), 310 mg (07/30/2018), 350 mg (08/21/2018) PACLitaxel (TAXOL) 330 mg in sodium chloride 0.9 % 500 mL chemo infusion (> 84m/m2), 175 mg/m2 = 330 mg (100 % of original dose 175 mg/m2), Intravenous,  Once, 6 of 6 cycles Dose modification: 175 mg/m2 (original dose 175 mg/m2, Cycle 1, Reason: Patient Age) Administration: 330 mg (05/03/2018), 330 mg (05/24/2018), 330  mg (06/14/2018), 330 mg (07/09/2018), 330 mg (07/30/2018), 330 mg (08/21/2018)  for chemotherapy treatment.    05/24/2018 -  Chemotherapy   The patient had pembrolizumab (KEYTRUDA) 200 mg in sodium chloride 0.9 % 50 mL chemo infusion, 200 mg, Intravenous, Once, 12 of 17 cycles  Administration: 200 mg (05/24/2018), 200 mg (06/14/2018), 200 mg (07/09/2018), 200 mg (08/21/2018), 200 mg (09/11/2018), 200 mg (10/02/2018), 200 mg (10/23/2018), 200 mg (11/13/2018), 200 mg (12/04/2018), 200 mg (12/25/2018), 200 mg (01/22/2019)  for chemotherapy treatment.    Squamous cell lung cancer, left ([facility])  06/14/2018 Initial Diagnosis   Squamous cell lung cancer, left ([facility])      CANCER STAGING: Cancer Staging Oropharyngeal carcinoma ([facility]) Staging form: Pharynx - Oropharynx, AJCC 7th Edition - Clinical: Stage IVA (T4a, N2b, M0) - Unsigned    INTERVAL HISTORY:  Mr. Roessner 68 y.o. male seen for follow-up of metastatic lung cancer.  He is receiving pembrolizumab maintenance therapy which was started in June 2020.  Denies any dry cough or diarrhea.  Denies any skin rashes.  He is continuing to work.  He has arthralgias for which he takes pain medication which is helping.  Appetite is 50%.  Energy levels are 100%.  He does have trouble swallowing from his previous head and neck cancer.  He had a PET scan done on 01/21/2019.    REVIEW OF SYSTEMS:  Review of Systems  HENT:   Positive for trouble swallowing.   Musculoskeletal: Positive for arthralgias.  All other systems reviewed and are negative.    PAST MEDICAL/SURGICAL HISTORY:  Past Medical History:  Diagnosis Date  . GERD (gastroesophageal reflux disease)   . 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 Oropharyngeal cancer ([facility]) 07/28/2014   dx. 3 weeks ago.- Dr. Oneal [facility] [place], [place].  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 BIOPSY  01/15/2018   Procedure: BIOPSY;  Surgeon: Danie Binder, MD;  [facility];  Service: Endoscopy;;  gastric  . COLONOSCOPY N/A 03/13/2016   Procedure: COLONOSCOPY;  Surgeon: Danie Binder, MD;  [facility];  Service: Endoscopy;   Laterality: N/A;  2:15 PM  . ESOPHAGOGASTRODUODENOSCOPY (EGD) WITH PROPOFOL N/A 08/17/2014   Procedure: ESOPHAGOGASTRODUODENOSCOPY (EGD) WITH PROPOFOL (procedure #1);  Surgeon: Aviva Signs Md, MD;  Location: [facility];  Service: General;  Laterality: N/A;  . ESOPHAGOGASTRODUODENOSCOPY (EGD) WITH PROPOFOL N/A 01/15/2018   Procedure: ESOPHAGOGASTRODUODENOSCOPY (EGD) WITH PROPOFOL;  Surgeon: Danie Binder, MD;  [facility];  Service: Endoscopy;  Laterality: N/A;  9:30am  . MULTIPLE EXTRACTIONS WITH ALVEOLOPLASTY N/A 08/12/2014   Procedure: Extraction of tooth #'s 6,17,22,23,24,25,26,27 with alveoloplasty;  Surgeon: Lenn Cal, DDS;  Location: [facility];  Service: Oral Surgery;  Laterality: N/A;  . PANENDOSCOPY N/A 08/06/2014   Procedure: PANENDOSCOPY WITH BIOPSY;  Surgeon: Leta Baptist, MD;  Location: [facility];  Service: ENT;  Laterality: N/A;  . PEG PLACEMENT Left 08/17/14  . PEG PLACEMENT N/A 08/17/2014   Procedure: PERCUTANEOUS ENDOSCOPIC GASTROSTOMY (PEG) PLACEMENT (procedure #1);  Surgeon: Aviva Signs Md, MD;  Location: [facility];  Service: General;  Laterality: N/A;  . PORT-A-CATH REMOVAL Right 07/17/2016   Procedure: MINOR REMOVAL PORT-A-CATH;  Surgeon: Aviva Signs, MD;  Location: [facility];  Service: General;  Laterality: Right;  . PORTACATH PLACEMENT Right 08/17/14  . PORTACATH  PLACEMENT Right 08/17/2014   Procedure: INSERTION PORT-A-CATH (procedure #2);  Surgeon: Aviva Signs Md, MD;  Location: [facility];  Service: General;  Laterality: Right;  . PORTACATH PLACEMENT Left 04/26/2018   Procedure: INSERTION PORT-A-CATH (attached catheter in left subclavian);  Surgeon: Aviva Signs, MD;  Location: [facility];  Service: General;  Laterality: Left;  . SAVORY DILATION N/A 01/15/2018   Procedure: SAVORY DILATION;  Surgeon: Danie Binder, MD;  [facility];  Service: Endoscopy;  Laterality: N/A;  . VIDEO BRONCHOSCOPY WITH ENDOBRONCHIAL ULTRASOUND N/A 04/15/2018   Procedure:  VIDEO BRONCHOSCOPY WITH ENDOBRONCHIAL ULTRASOUND;  Surgeon: Melrose Nakayama, MD;  Location: [facility];  Service: Thoracic;  Laterality: N/A;     SOCIAL HISTORY:  Social History   Socioeconomic History  . Marital status: Legally Separated    Spouse name: Not on file  . Number of children: 5  . Years of education: Not on file  . Highest education level: Not on file  Occupational History  . Not on file  Social Needs  . Financial resource strain: Not on file  . Food insecurity    Worry: Not on file    Inability: Not on file  . Transportation needs    Medical: Not on file    Non-medical: Not on file  Tobacco Use  . Smoking status: Former Smoker    Packs/day: 0.50    Years: 30.00    Pack years: 15.00    Quit date: 07/22/2014    Years since quitting: 4.5  . Smokeless tobacco: Never Used  Substance and Sexual Activity  . Alcohol use: Not Currently    Alcohol/week: 0.0 standard drinks    Comment: None currently (11/09/17); previously 1-2 beers on the weekend  . Drug use: No  . Sexual activity: Not on file  Lifestyle  . Physical activity    Days per week: Not on file    Minutes per session: Not on file  . Stress: Not on file  Relationships  . Social Herbalist on phone: Not on file    Gets together: Not on file    Attends religious service: Not on file    Active member of club or organization: Not on file    Attends meetings of clubs or organizations: Not on file    Relationship status: Not on file  . Intimate partner violence    Fear of current or ex partner: Not on file    Emotionally abused: Not on file    Physically abused: Not on file    Forced sexual activity: Not on file  Other Topics Concern  . Not on file  Social History Narrative  . Not on file    FAMILY HISTORY:  Family History  Problem Relation Age of Onset  . Colon cancer Neg Hx   . Gastric cancer Neg Hx   . Esophageal cancer Neg Hx     CURRENT MEDICATIONS:  Outpatient Encounter  Medications as of 01/22/2019  Medication Sig  . CARBOPLATIN IV Inject into the vein every 21 ( twenty-one) days.  . feeding supplement, ENSURE ENLIVE, (ENSURE ENLIVE) LIQD Take 237 mLs by mouth 2 (two) times daily.  Marland Kitchen HYDROcodone-acetaminophen (NORCO/VICODIN) 5-325 MG tablet Take 1 tablet every 12 hours as needed.  Marland Kitchen levothyroxine (SYNTHROID) 50 MCG tablet TAKE 1 TABLET BY MOUTH DAILY BEFORE BREAKAST.  . Melatonin 10 MG TABS Take 1 tablet by mouth at bedtime.  Marland Kitchen omeprazole (PRILOSEC) 20 MG capsule TAKE 1 CAPSULE BY  MOUTH 30 MINUTES PRIOR TO BREAKFAST  . PACLitaxel (TAXOL IV) Inject into the vein every 21 ( twenty-one) days.  . Pembrolizumab (KEYTRUDA IV) Inject into the vein every 21 ( twenty-one) days.  . [DISCONTINUED] prochlorperazine (COMPAZINE) 10 MG tablet Take 1 tablet (10 mg total) by mouth every 6 (six) hours as needed (Nausea or vomiting). (Patient not taking: Reported on 11/13/2018)   No facility-administered encounter medications on file as of 01/22/2019.     ALLERGIES:  No Known Allergies   PHYSICAL EXAM:  ECOG Performance status: 1  Vitals:   01/22/19 0927  BP: 110/74  Pulse: 66  Resp: 18  Temp: 97.6 F (36.4 C)  SpO2: 99%   Filed Weights   01/22/19 0927  Weight: 148 lb 14.4 oz (67.5 kg)    Physical Exam Vitals signs reviewed.  Constitutional:      Appearance: Normal appearance.  Cardiovascular:     Rate and Rhythm: Normal rate and regular rhythm.     Heart sounds: Normal heart sounds.  Pulmonary:     Effort: Pulmonary effort is normal.     Breath sounds: Normal breath sounds.  Abdominal:     General: There is no distension.     Palpations: Abdomen is soft. There is no mass.  Musculoskeletal:        General: No swelling.  Skin:    General: Skin is warm.  Neurological:     General: No focal deficit present.     Mental Status: He is alert and oriented to person, place, and time.  Psychiatric:        Mood and Affect: Mood normal.        Behavior:  Behavior normal.      LABORATORY DATA:  I have reviewed the labs as listed.  CBC    Component Value Date/Time   WBC 5.6 01/22/2019 0922   RBC 4.17 (L) 01/22/2019 0922   HGB 12.5 (L) 01/22/2019 0922   HCT 38.6 (L) 01/22/2019 0922   PLT 186 01/22/2019 0922   MCV 92.6 01/22/2019 0922   MCH 30.0 01/22/2019 0922   MCHC 32.4 01/22/2019 0922   RDW 16.2 (H) 01/22/2019 0922   LYMPHSABS 1.5 01/22/2019 0922   MONOABS 0.6 01/22/2019 0922   EOSABS 0.2 01/22/2019 0922   BASOSABS 0.0 01/22/2019 0922   CMP Latest Ref Rng & Units 01/22/2019 12/25/2018 12/04/2018  Glucose 70 - 99 mg/dL 123(H) 104(H) 72  BUN 8 - 23 mg/dL '21 20 16  '$ Creatinine 0.61 - 1.24 mg/dL 2.03(H) 2.00(H) 1.76(H)  Sodium 135 - 145 mmol/L 139 141 138  Potassium 3.5 - 5.1 mmol/L 4.1 3.8 4.5  Chloride 98 - 111 mmol/L 103 105 102  CO2 22 - 32 mmol/L '24 24 26  '$ Calcium 8.9 - 10.3 mg/dL 9.5 9.3 9.2  Total Protein 6.5 - 8.1 g/dL 7.2 7.3 7.2  Total Bilirubin 0.3 - 1.2 mg/dL 0.9 0.4 0.6  Alkaline Phos 38 - 126 U/L 56 53 49  AST 15 - 41 U/L '25 24 23  '$ ALT 0 - 44 U/L '21 22 21       '$ DIAGNOSTIC IMAGING:  I have independently reviewed the scans and discussed with the patient.   I have reviewed Venita Lick LPN's note and agree with the documentation.  I personally performed a face-to-face visit, made revisions and my assessment and plan is as follows.    ASSESSMENT & PLAN:   Squamous cell lung cancer, left ([facility]) 1.  Advanced squamous cell carcinoma of the  left lung: - PET scan on 02/18/2018 showed interval development of hypermetabolic mediastinal and left hilar adenopathy associated with small hypermetabolic left lung nodules both in upper and lower lobes.  Persistent hypermetabolic activity in the right prostate gland. -PDL 1 testing was not done.  Foundation 1 testing shows MS-stable, TMB-74mts/mb, NOTCH 1 Loss, PTEN loss, RB1 loss, FAS loss - 6 cycles of carboplatinum, paclitaxel and pembrolizumab from 05/03/2018 through  08/21/2018. -Maintenance pembrolizumab started on 09/11/2018.   -We reviewed results of the PET scan dated 01/20/2019 which showed response to therapy of thoracic nodal metastasis.  No new metastatic disease. -He is continuing to tolerate pembrolizumab without any major immunotherapy related side effects. -I reviewed his labs.  He will proceed with next cycle.  We will reevaluate him in 3 weeks for toxicity assessment.  2.  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 -PET scan did not show any hypermetabolic activity in the neck.  3.  Bilateral knee pains: - He is taking hydrocodone 5 mg 1 to 2 tablets/day as needed.  We will give a refill.  4.  Elevated PSA: -No distinct increased prostatic metabolic activity on the current PET/CT scan from 10/14/2018.     Total time spent is 25 minutes with more than 50% of the time spent face-to-face discussing scan results, treatment plan, counseling and coordination of care.    Orders placed this encounter:  No orders of the defined types were placed in this encounter.     SDerek Jack MD [phone]

## 2019-01-27 ENCOUNTER — Other Ambulatory Visit (HOSPITAL_COMMUNITY): Payer: Self-pay | Admitting: Hematology

## 2019-01-27 DIAGNOSIS — C3492 Malignant neoplasm of unspecified part of left bronchus or lung: Secondary | ICD-10-CM

## 2019-01-28 ENCOUNTER — Other Ambulatory Visit (HOSPITAL_COMMUNITY): Payer: Self-pay | Admitting: *Deleted

## 2019-01-28 ENCOUNTER — Telehealth (HOSPITAL_COMMUNITY): Payer: Self-pay | Admitting: *Deleted

## 2019-01-28 DIAGNOSIS — C3492 Malignant neoplasm of unspecified part of left bronchus or lung: Secondary | ICD-10-CM

## 2019-01-28 MED ORDER — HYDROCODONE-ACETAMINOPHEN 5-325 MG PO TABS: ORAL_TABLET | ORAL | 0 refills | Status: DC

## 2019-02-12 ENCOUNTER — Other Ambulatory Visit: Payer: Self-pay

## 2019-02-12 ENCOUNTER — Inpatient Hospital Stay (HOSPITAL_COMMUNITY): Payer: Medicare Other | Attending: Hematology

## 2019-02-12 ENCOUNTER — Inpatient Hospital Stay (HOSPITAL_BASED_OUTPATIENT_CLINIC_OR_DEPARTMENT_OTHER): Payer: Medicare Other | Admitting: Hematology

## 2019-02-12 ENCOUNTER — Encounter (HOSPITAL_COMMUNITY): Payer: Self-pay | Admitting: Hematology

## 2019-02-12 ENCOUNTER — Inpatient Hospital Stay (HOSPITAL_COMMUNITY): Payer: Medicare Other

## 2019-02-12 VITALS — BP 105/74 | HR 75 | Temp 97.2°F | Resp 16 | Wt 151.0 lb

## 2019-02-12 VITALS — BP 117/67 | HR 53 | Temp 96.9°F | Resp 18

## 2019-02-12 DIAGNOSIS — C109 Malignant neoplasm of oropharynx, unspecified: Secondary | ICD-10-CM

## 2019-02-12 DIAGNOSIS — Z87891 Personal history of nicotine dependence: Secondary | ICD-10-CM | POA: Diagnosis not present

## 2019-02-12 DIAGNOSIS — C3492 Malignant neoplasm of unspecified part of left bronchus or lung: Secondary | ICD-10-CM | POA: Diagnosis not present

## 2019-02-12 DIAGNOSIS — C3482 Malignant neoplasm of overlapping sites of left bronchus and lung: Secondary | ICD-10-CM | POA: Diagnosis present

## 2019-02-12 DIAGNOSIS — E039 Hypothyroidism, unspecified: Secondary | ICD-10-CM | POA: Diagnosis not present

## 2019-02-12 DIAGNOSIS — Z5112 Encounter for antineoplastic immunotherapy: Secondary | ICD-10-CM | POA: Insufficient documentation

## 2019-02-12 DIAGNOSIS — K219 Gastro-esophageal reflux disease without esophagitis: Secondary | ICD-10-CM | POA: Insufficient documentation

## 2019-02-12 DIAGNOSIS — C01 Malignant neoplasm of base of tongue: Secondary | ICD-10-CM | POA: Diagnosis present

## 2019-02-12 DIAGNOSIS — Z79899 Other long term (current) drug therapy: Secondary | ICD-10-CM | POA: Insufficient documentation

## 2019-02-12 DIAGNOSIS — M25569 Pain in unspecified knee: Secondary | ICD-10-CM | POA: Insufficient documentation

## 2019-02-12 LAB — COMPREHENSIVE METABOLIC PANEL
ALT: 26 U/L (ref 0–44)
AST: 23 U/L (ref 15–41)
Albumin: 4.2 g/dL (ref 3.5–5.0)
Alkaline Phosphatase: 48 U/L (ref 38–126)
Anion gap: 11 (ref 5–15)
BUN: 30 mg/dL — ABNORMAL HIGH (ref 8–23)
CO2: 25 mmol/L (ref 22–32)
Calcium: 9.2 mg/dL (ref 8.9–10.3)
Chloride: 106 mmol/L (ref 98–111)
Creatinine, Ser: 1.98 mg/dL — ABNORMAL HIGH (ref 0.61–1.24)
GFR calc Af Amer: 39 mL/min — ABNORMAL LOW (ref >60)
GFR calc non Af Amer: 33 mL/min — ABNORMAL LOW (ref >60)
Glucose, Bld: 96 mg/dL (ref 70–99)
Potassium: 4 mmol/L (ref 3.5–5.1)
Sodium: 142 mmol/L (ref 135–145)
Total Bilirubin: 0.6 mg/dL (ref 0.3–1.2)
Total Protein: 7.2 g/dL (ref 6.5–8.1)

## 2019-02-12 LAB — CBC WITH DIFFERENTIAL/PLATELET
Abs Immature Granulocytes: 0.01 10*3/uL (ref 0.00–0.07)
Basophils Absolute: 0 10*3/uL (ref 0.0–0.1)
Basophils Relative: 0 %
Eosinophils Absolute: 0.2 10*3/uL (ref 0.0–0.5)
Eosinophils Relative: 4 %
HCT: 40.2 % (ref 39.0–52.0)
Hemoglobin: 13.1 g/dL (ref 13.0–17.0)
Immature Granulocytes: 0 %
Lymphocytes Relative: 32 %
Lymphs Abs: 1.5 10*3/uL (ref 0.7–4.0)
MCH: 30.2 pg (ref 26.0–34.0)
MCHC: 32.6 g/dL (ref 30.0–36.0)
MCV: 92.6 fL (ref 80.0–100.0)
Monocytes Absolute: 0.5 10*3/uL (ref 0.1–1.0)
Monocytes Relative: 10 %
Neutro Abs: 2.5 10*3/uL (ref 1.7–7.7)
Neutrophils Relative %: 54 %
Platelets: 202 10*3/uL (ref 150–400)
RBC: 4.34 MIL/uL (ref 4.22–5.81)
RDW: 16.7 % — ABNORMAL HIGH (ref 11.5–15.5)
WBC: 4.6 10*3/uL (ref 4.0–10.5)
nRBC: 0 % (ref 0.0–0.2)

## 2019-02-12 MED ORDER — SODIUM CHLORIDE 0.9 % IV SOLN
Freq: Once | INTRAVENOUS | Status: AC
  Administered 2019-02-12: 09:00:00 via INTRAVENOUS

## 2019-02-12 MED ORDER — HEPARIN SOD (PORK) LOCK FLUSH 100 UNIT/ML IV SOLN
500.0000 [IU] | Freq: Once | INTRAVENOUS | Status: AC | PRN
  Administered 2019-02-12: 500 [IU]

## 2019-02-12 MED ORDER — HYDROCODONE-ACETAMINOPHEN 5-325 MG PO TABS: ORAL_TABLET | ORAL | 0 refills | Status: DC

## 2019-02-12 MED ORDER — SODIUM CHLORIDE 0.9 % IV SOLN
200.0000 mg | Freq: Once | INTRAVENOUS | Status: AC
  Administered 2019-02-12: 200 mg via INTRAVENOUS
  Filled 2019-02-12: qty 8

## 2019-02-12 MED ORDER — SODIUM CHLORIDE 0.9% FLUSH
10.0000 mL | INTRAVENOUS | Status: DC | PRN
  Administered 2019-02-12: 10 mL
  Filled 2019-02-12: qty 10

## 2019-02-12 NOTE — Progress Notes (Signed)
Labs reviewed with MD today. Proceed with treatment per MD.    Treatment given per orders. Patient tolerated it well without problems. Vitals stable and discharged home from clinic ambulatory. Follow up as scheduled.

## 2019-02-12 NOTE — Patient Instructions (Signed)
[place] at [facility] Discharge Instructions  You were seen today by Dr. Delton Coombes. He went over your recent lab results. He will see you back in 3 weeks for labs, treatment and follow up.   Thank you for choosing [place] at [facility] to provide your oncology and hematology care.  To afford each patient quality time with our provider, please arrive at least 15 minutes before your scheduled appointment time.   If you have a lab appointment with the [facility] please come in thru the  Main Entrance and check in at the main information desk  You need to re-schedule your appointment should you arrive 10 or more minutes late.  We strive to give you quality time with our providers, and arriving late affects you and other patients whose appointments are after yours.  Also, if you no show three or more times for appointments you may be dismissed from the clinic at the providers discretion.     Again, thank you for choosing [facility].  Our hope is that these requests will decrease the amount of time that you wait before being seen by our physicians.       _____________________________________________________________  Should you have questions after your visit to [facility], please contact our office at [phone] between the hours of 8:00 a.m. and 4:30 p.m.  Voicemails left after 4:00 p.m. will not be returned until the following business day.  For prescription refill requests, have your pharmacy contact our office and allow 72 hours.    Cancer Center Support Programs:   > Cancer Support Group  2nd Tuesday of the month 1pm-2pm, Journey Room

## 2019-02-12 NOTE — Patient Instructions (Signed)
[facility] Discharge Instructions for Patients Receiving Chemotherapy  Today you received the following chemotherapy agents   To help prevent nausea and vomiting after your treatment, we encourage you to take your nausea medication   If you develop nausea and vomiting that is not controlled by your nausea medication, call the clinic.   BELOW ARE SYMPTOMS THAT SHOULD BE REPORTED IMMEDIATELY:  *FEVER GREATER THAN 100.5 F  *CHILLS WITH OR WITHOUT FEVER  NAUSEA AND VOMITING THAT IS NOT CONTROLLED WITH YOUR NAUSEA MEDICATION  *UNUSUAL SHORTNESS OF BREATH  *UNUSUAL BRUISING OR BLEEDING  TENDERNESS IN MOUTH AND THROAT WITH OR WITHOUT PRESENCE OF ULCERS  *URINARY PROBLEMS  *BOWEL PROBLEMS  UNUSUAL RASH Items with * indicate a potential emergency and should be followed up as soon as possible.  Feel free to call the clinic should you have any questions or concerns. The clinic phone number is [phone].  Please show the CHEMO ALERT CARD at check-in to the Emergency Department and triage nurse.   

## 2019-02-12 NOTE — Progress Notes (Signed)
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   07/28/2014 Initial Diagnosis   Oropharyngeal cancer   08/03/2014 Imaging   CT CAP- L supraclavicular lymphadenopathy is not completely visualized. This is better seen on the previous neck CT from 07/27/2014. Otherwise, no evidence for metastatic disease in the chest, abdomen, or pelvis.   08/03/2014 Imaging   Bone scan- Uptake at adjacent anterior LEFT 6, 7, 8 ribs likely representing trauma/fractures. Questionable nonspecific increased tracer localization at the posterior RIGHT 8th and 9th ribs, the adjacent nature which raises a a question of trauma as well   08/06/2014 Pathology Results   Dr. Benjamine Mola- Oropharynx, biopsy, Left - INVASIVE SQUAMOUS CELL CARCINOMA.   08/12/2014 Procedure   Dr. Enrique Sack- 1. Multiple extraction of tooth numbers 6, 17, 22, 23, 24, 25, 26, and 27. 3 Quadrants of alveoloplasty   08/17/2014 Pathology Results   PORT and G-TUBE placed by Dr. Carlis Stable.   08/26/2014 PET scan   Large hypermetabolic mass in the left base of tongue. Activity extends across midline to the right base tongue. 2. Intensely hypermetabolic left cervical metastatic lymph nodes. Lymph nodes extend from the left level II position to the left supraclavi   09/01/2014 - 09/22/2014 Chemotherapy   Concurrent chemoradiation with Cisplatin 100 mg/m2 x 2 cycles with Neulasta support. Held cycle #3 d/t renal toxicity.    09/03/2014 - 10/23/2014 Radiation Therapy   Treated in [place], IMRT Isidore Moos).  Base of tongue and bilat neck. Total dose: 70 Gy in 35  fractions. (of note, he did miss several treatments requiring BID dosing towards the end of treatment).    01/25/2015 PET scan   Near complete resolution of metabolic activity at the base of tongue. Minimal residual activity is likely post treatment effect. 2. Complete resolution of metabolic activity above LEFT cervical lymph nodes. No evidence of residual metabolically active    1/82/9937 Procedure   Port-a-cath removed [name])    05/03/2018 - 09/10/2018 Chemotherapy   The patient had palonosetron (ALOXI) injection 0.25 mg, 0.25 mg, Intravenous,  Once, 6 of 6 cycles Administration: 0.25 mg (05/03/2018), 0.25 mg (05/24/2018), 0.25 mg (06/14/2018), 0.25 mg (07/09/2018), 0.25 mg (07/30/2018), 0.25 mg (08/21/2018) pegfilgrastim-cbqv (UDENYCA) injection 6 mg, 6 mg, Subcutaneous, Once, 5 of 5 cycles Administration: 6 mg (05/27/2018), 6 mg (06/17/2018), 6 mg (07/11/2018), 6 mg (08/01/2018), 6 mg (08/23/2018) CARBOplatin (PARAPLATIN) 380 mg in sodium chloride 0.9 % 250 mL chemo infusion, 380 mg (100 % of original dose 381 mg), Intravenous,  Once, 6 of 6 cycles Dose modification:   (original dose 381 mg, Cycle 1),   (original dose 309.5 mg, Cycle 2), 307.5 mg (original dose 309.5 mg, Cycle 5) Administration: 380 mg (05/03/2018), 310 mg (05/24/2018), 310 mg (06/14/2018), 340 mg (07/09/2018), 310 mg (07/30/2018), 350 mg (08/21/2018) PACLitaxel (TAXOL) 330 mg in sodium chloride 0.9 % 500 mL chemo infusion (> 37m/m2), 175 mg/m2 = 330 mg (100 % of original dose 175 mg/m2), Intravenous,  Once, 6 of 6 cycles Dose modification: 175 mg/m2 (original dose 175 mg/m2, Cycle 1, Reason: Patient Age) Administration: 330 mg (05/03/2018), 330 mg (05/24/2018), 330  mg (06/14/2018), 330 mg (07/09/2018), 330 mg (07/30/2018), 330 mg (08/21/2018)  for chemotherapy treatment.    05/24/2018 -  Chemotherapy   The patient had pembrolizumab (KEYTRUDA) 200 mg in sodium chloride 0.9 % 50 mL chemo infusion, 200 mg, Intravenous, Once, 13 of 17  cycles Administration: 200 mg (05/24/2018), 200 mg (06/14/2018), 200 mg (07/09/2018), 200 mg (08/21/2018), 200 mg (09/11/2018), 200 mg (10/02/2018), 200 mg (10/23/2018), 200 mg (11/13/2018), 200 mg (12/04/2018), 200 mg (12/25/2018), 200 mg (01/22/2019), 200 mg (02/12/2019)  for chemotherapy treatment.    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 Belinsky 68 y.o. male seen for follow-up of metastatic lung cancer.  He has been receiving maintenance pembrolizumab which was started in June 2020.  Denies any dry cough or diarrhea.  No skin rashes reported.  He is continuing to work.  He has knee pains for which he takes hydrocodone 1 to 2 tablets/day which is helping.  He ran out of the pills and requests refill.  Appetite and energy levels are 100%.  Denies any fevers, night sweats or weight loss.  No ER visits or hospitalizations.   REVIEW OF SYSTEMS:  Review of Systems  Musculoskeletal: Positive for arthralgias.  All other systems reviewed and are negative.    PAST MEDICAL/SURGICAL HISTORY:  Past Medical History:  Diagnosis Date   GERD (gastroesophageal reflux disease)    Mass of neck    dx. oropharyngeal squamous cell carcinoma- Chemo. radiation planned   Oropharyngeal cancer ([facility]) 07/28/2014   dx. 3 weeks ago.- Dr. [facility] [place], [place].   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   SCCa of Left BOT   Past Surgical History:  Procedure Laterality Date   BIOPSY  01/15/2018   Procedure: BIOPSY;  Surgeon: Danie Binder, MD;  [facility];  Service: Endoscopy;;  gastric   COLONOSCOPY N/A 03/13/2016   Procedure: COLONOSCOPY;  Surgeon: Danie Binder, MD;  [facility];  Service:  Endoscopy;  Laterality: N/A;  2:15 PM   ESOPHAGOGASTRODUODENOSCOPY (EGD) WITH PROPOFOL N/A 08/17/2014   Procedure: ESOPHAGOGASTRODUODENOSCOPY (EGD) WITH PROPOFOL (procedure #1);  Surgeon: Aviva Signs Md, MD;  Location: [facility];  Service: General;  Laterality: N/A;   ESOPHAGOGASTRODUODENOSCOPY (EGD) WITH PROPOFOL N/A 01/15/2018   Procedure: ESOPHAGOGASTRODUODENOSCOPY (EGD) WITH PROPOFOL;  Surgeon: Danie Binder, MD;  [facility];  Service: Endoscopy;  Laterality: N/A;  9:30am   MULTIPLE EXTRACTIONS WITH ALVEOLOPLASTY N/A 08/12/2014   Procedure: Extraction of tooth #'s 6,17,22,23,24,25,26,27 with alveoloplasty;  Surgeon: Lenn Cal, DDS;  Location: [facility];  Service: Oral Surgery;  Laterality: N/A;   PANENDOSCOPY N/A 08/06/2014   Procedure: PANENDOSCOPY WITH BIOPSY;  Surgeon: Leta Baptist, MD;  Location: [facility];  Service: ENT;  Laterality: N/A;   PEG PLACEMENT Left 08/17/14   PEG PLACEMENT N/A 08/17/2014   Procedure: PERCUTANEOUS ENDOSCOPIC GASTROSTOMY (PEG) PLACEMENT (procedure #1);  Surgeon: Aviva Signs Md, MD;  Location: [facility];  Service: General;  Laterality: N/A;   PORT-A-CATH REMOVAL Right 07/17/2016   Procedure: MINOR REMOVAL PORT-A-CATH;  Surgeon: Aviva Signs, MD;  Location[facility];  Service: General;  Laterality: Right;   PORTACATH PLACEMENT Right 08/17/14   PORTACATH PLACEMENT Right  08/17/2014   Procedure: INSERTION PORT-A-CATH (procedure #2);  Surgeon: Aviva Signs Md, MD;  Location[facility];  Service: General;  Laterality: Right;   PORTACATH PLACEMENT Left 04/26/2018   Procedure: INSERTION PORT-A-CATH (attached catheter in left subclavian);  Surgeon: Aviva Signs, MD;  Location[facility];  Service: General;  Laterality: Left;   SAVORY DILATION N/A 01/15/2018   Procedure: SAVORY DILATION;  Surgeon: Danie Binder, MD;  [facility];  Service: Endoscopy;  Laterality: N/A;   VIDEO BRONCHOSCOPY WITH ENDOBRONCHIAL ULTRASOUND N/A 04/15/2018    Procedure: VIDEO BRONCHOSCOPY WITH ENDOBRONCHIAL ULTRASOUND;  Surgeon: Melrose Nakayama, MD;  Location: [facility];  Service: Thoracic;  Laterality: N/A;     SOCIAL HISTORY:  Social History   Socioeconomic History   Marital status: Legally Separated    Spouse name: Not on file   Number of children: 5   Years of education: Not on file   Highest education level: Not on file  Occupational History   Not on file  Social Needs   Financial resource strain: Not on file   Food insecurity    Worry: Not on file    Inability: Not on file   Transportation needs    Medical: Not on file    Non-medical: Not on file  Tobacco Use   Smoking status: Former Smoker    Packs/day: 0.50    Years: 30.00    Pack years: 15.00    Quit date: 07/22/2014    Years since quitting: 4.5   Smokeless tobacco: Never Used  Substance and Sexual Activity   Alcohol use: Not Currently    Alcohol/week: 0.0 standard drinks    Comment: None currently (11/09/17); previously 1-2 beers on the weekend   Drug use: No   Sexual activity: Not on file  Lifestyle   Physical activity    Days per week: Not on file    Minutes per session: Not on file   Stress: Not on file  Relationships   Social connections    Talks on phone: Not on file    Gets together: Not on file    Attends religious service: Not on file    Active member of club or organization: Not on file    Attends meetings of clubs or organizations: Not on file    Relationship status: Not on file   Intimate partner violence    Fear of current or ex partner: Not on file    Emotionally abused: Not on file    Physically abused: Not on file    Forced sexual activity: Not on file  Other Topics Concern   Not on file  Social History Narrative   Not on file    FAMILY HISTORY:  Family History  Problem Relation Age of Onset   Colon cancer Neg Hx    Gastric cancer Neg Hx    Esophageal cancer Neg Hx     CURRENT MEDICATIONS:  Outpatient  Encounter Medications as of 02/12/2019  Medication Sig   feeding supplement, ENSURE ENLIVE, (ENSURE ENLIVE) LIQD Take 237 mLs by mouth 2 (two) times daily.   HYDROcodone-acetaminophen (NORCO/VICODIN) 5-325 MG tablet TAKE 1 TABLET EVERY 12 HOURS AS NEEDED FOR MODERATE PAIN   levothyroxine (SYNTHROID) 50 MCG tablet TAKE 1 TABLET BY MOUTH DAILY BEFORE BREAKAST.   Melatonin 10 MG TABS Take 1 tablet by mouth at bedtime.   omeprazole (PRILOSEC) 20 MG capsule TAKE 1 CAPSULE BY MOUTH 30 MINUTES PRIOR TO BREAKFAST   Pembrolizumab (KEYTRUDA IV) Inject  into the vein every 21 ( twenty-one) days.   [DISCONTINUED] HYDROcodone-acetaminophen (NORCO/VICODIN) 5-325 MG tablet TAKE 1 TABLET EVERY 12 HOURS AS NEEDED FOR MODERATE PAIN   [DISCONTINUED] CARBOPLATIN IV Inject into the vein every 21 ( twenty-one) days.   [DISCONTINUED] PACLitaxel (TAXOL IV) Inject into the vein every 21 ( twenty-one) days.   [DISCONTINUED] prochlorperazine (COMPAZINE) 10 MG tablet Take 1 tablet (10 mg total) by mouth every 6 (six) hours as needed (Nausea or vomiting). (Patient not taking: Reported on 11/13/2018)   No facility-administered encounter medications on file as of 02/12/2019.     ALLERGIES:  No Known Allergies   PHYSICAL EXAM:  ECOG Performance status: 1  Vitals:   02/12/19 0829  BP: 105/74  Pulse: 75  Resp: 16  Temp: (!) 97.2 F (36.2 C)  SpO2: 99%   Filed Weights   02/12/19 0829  Weight: 151 lb (68.5 kg)    Physical Exam Vitals signs reviewed.  Constitutional:      Appearance: Normal appearance.  Cardiovascular:     Rate and Rhythm: Normal rate and regular rhythm.     Heart sounds: Normal heart sounds.  Pulmonary:     Effort: Pulmonary effort is normal.     Breath sounds: Normal breath sounds.  Abdominal:     General: There is no distension.     Palpations: Abdomen is soft. There is no mass.  Musculoskeletal:        General: No swelling.  Skin:    General: Skin is warm.  Neurological:      General: No focal deficit present.     Mental Status: He is alert and oriented to person, place, and time.  Psychiatric:        Mood and Affect: Mood normal.        Behavior: Behavior normal.      LABORATORY DATA:  I have reviewed the labs as listed.  CBC    Component Value Date/Time   WBC 4.6 02/12/2019 0821   RBC 4.34 02/12/2019 0821   HGB 13.1 02/12/2019 0821   HCT 40.2 02/12/2019 0821   PLT 202 02/12/2019 0821   MCV 92.6 02/12/2019 0821   MCH 30.2 02/12/2019 0821   MCHC 32.6 02/12/2019 0821   RDW 16.7 (H) 02/12/2019 0821   LYMPHSABS 1.5 02/12/2019 0821   MONOABS 0.5 02/12/2019 0821   EOSABS 0.2 02/12/2019 0821   BASOSABS 0.0 02/12/2019 0821   CMP Latest Ref Rng & Units 02/12/2019 01/22/2019 12/25/2018  Glucose 70 - 99 mg/dL 96 123(H) 104(H)  BUN 8 - 23 mg/dL 30(H) 21 20  Creatinine 0.61 - 1.24 mg/dL 1.98(H) 2.03(H) 2.00(H)  Sodium 135 - 145 mmol/L 142 139 141  Potassium 3.5 - 5.1 mmol/L 4.0 4.1 3.8  Chloride 98 - 111 mmol/L 106 103 105  CO2 22 - 32 mmol/L _0 Calcium 8.9 - 10.3 mg/dL 9.2 9.5 9.3  Total Protein 6.5 - 8.1 g/dL 7.2 7.2 7.3  Total Bilirubin 0.3 - 1.2 mg/dL 0.6 0.9 0.4  Alkaline Phos 38 - 126 U/L 48 56 53  AST 15 - 41 U/L _1 ALT 0 - 44 U/L _2 DIAGNOSTIC IMAGING:  I have independently reviewed the scans and discussed with the patient.   I have reviewed Venita Lick LPN's note and agree with the documentation.  I personally performed a face-to-face visit, made revisions and my assessment and plan is as follows.    ASSESSMENT &  PLAN:   Squamous cell lung cancer, left ([facility]) 1.  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  Persistent hypermetabolic activity in the right prostate gland. -PDL 1 testing was not done.   Foundation 1 testing shows MS-stable, TMB-54mts/mb, NOTCH 1 Loss, PTEN loss, RB1 loss, FAS loss - 6 cycles of carboplatinum, paclitaxel and pembrolizumab from 05/03/2018 through 08/21/2018. -Maintenance pembrolizumab started on 09/11/2018.   -PET scan from 01/20/2019 showed response to therapy of thoracic nodal metastasis.  No new metastatic disease. -He is continuing to tolerate pembrolizumab very well.  Denies any immunotherapy related side effects. -We reviewed his labs.  Physical exam is within normal limits.  He will proceed with his pembrolizumab today.  We will see him back in 3 weeks for follow-up.    2.  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 -PET scan did not show any hypermetabolic activity in the neck.  3.  Bilateral knee pains: -Is taking hydrocodone 5/325 1 to 2 tablets/day.  We will give a refill.  4.  Elevated PSA: -No distinct increased prostatic metabolic activity on the current PET/CT scan from 10/14/2018. -Last PSA was 28.6 on 01/14/2019.  5.  Hypothyroidism: -TSH on 01/22/2019 was 6.91.  He is on Synthroid 50 mcg daily. -We will plan to repeat it at next visit.     Total time spent is 25 minutes with more than 50% of the time spent face-to-face discussing scan results, treatment plan, counseling and coordination of care.    Orders placed this encounter:  Orders Placed This Encounter  Procedures   CBC with Differential/Platelet   Comprehensive metabolic panel   TSH      SDerek Jack MD [phone]

## 2019-02-12 NOTE — Assessment & Plan Note (Signed)
1.  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  Persistent hypermetabolic activity in the right prostate gland. -PDL 1 testing was not done.  Foundation 1 testing shows MS-stable, TMB-54mts/mb, NOTCH 1 Loss, PTEN loss, RB1 loss, FAS loss - 6 cycles of carboplatinum, paclitaxel and pembrolizumab from 05/03/2018 through 08/21/2018. -Maintenance pembrolizumab started on 09/11/2018.   -PET scan from 01/20/2019 showed response to therapy of thoracic nodal metastasis.  No new metastatic disease. -He is continuing to tolerate pembrolizumab very well.  Denies any immunotherapy related side effects. -We reviewed his labs.  Physical exam is within normal limits.  He will proceed with his pembrolizumab today.  We will see him back in 3 weeks for follow-up.    2.  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 -PET scan did not show any hypermetabolic activity in the neck.  3.  Bilateral knee pains: -Is taking hydrocodone 5/325 1 to 2 tablets/day.  We will give a refill.  4.  Elevated PSA: -No distinct increased prostatic metabolic activity on the current PET/CT scan from 10/14/2018. -Last PSA was 28.6 on 01/14/2019.  5.  Hypothyroidism: -TSH on 01/22/2019 was 6.91.  He is on Synthroid 50 mcg daily. -We will plan to repeat it at next visit.

## 2019-02-14 ENCOUNTER — Other Ambulatory Visit (HOSPITAL_COMMUNITY): Payer: Self-pay | Admitting: Hematology

## 2019-02-14 DIAGNOSIS — E038 Other specified hypothyroidism: Secondary | ICD-10-CM

## 2019-03-05 ENCOUNTER — Other Ambulatory Visit (HOSPITAL_COMMUNITY): Payer: Self-pay | Admitting: *Deleted

## 2019-03-05 ENCOUNTER — Other Ambulatory Visit: Payer: Self-pay

## 2019-03-05 ENCOUNTER — Inpatient Hospital Stay (HOSPITAL_COMMUNITY): Payer: Medicare Other

## 2019-03-05 ENCOUNTER — Inpatient Hospital Stay (HOSPITAL_COMMUNITY): Payer: Medicare Other | Attending: Hematology

## 2019-03-05 ENCOUNTER — Inpatient Hospital Stay (HOSPITAL_BASED_OUTPATIENT_CLINIC_OR_DEPARTMENT_OTHER): Payer: Medicare Other | Admitting: Hematology

## 2019-03-05 ENCOUNTER — Encounter (HOSPITAL_COMMUNITY): Payer: Self-pay | Admitting: Hematology

## 2019-03-05 VITALS — BP 119/67 | HR 65 | Temp 96.8°F | Resp 18 | Wt 150.6 lb

## 2019-03-05 VITALS — BP 103/58 | HR 52 | Temp 96.8°F | Resp 18

## 2019-03-05 DIAGNOSIS — C3482 Malignant neoplasm of overlapping sites of left bronchus and lung: Secondary | ICD-10-CM | POA: Insufficient documentation

## 2019-03-05 DIAGNOSIS — Z79899 Other long term (current) drug therapy: Secondary | ICD-10-CM | POA: Diagnosis not present

## 2019-03-05 DIAGNOSIS — Z5112 Encounter for antineoplastic immunotherapy: Secondary | ICD-10-CM | POA: Insufficient documentation

## 2019-03-05 DIAGNOSIS — C3492 Malignant neoplasm of unspecified part of left bronchus or lung: Secondary | ICD-10-CM | POA: Diagnosis not present

## 2019-03-05 DIAGNOSIS — C109 Malignant neoplasm of oropharynx, unspecified: Secondary | ICD-10-CM

## 2019-03-05 DIAGNOSIS — C01 Malignant neoplasm of base of tongue: Secondary | ICD-10-CM | POA: Diagnosis present

## 2019-03-05 LAB — COMPREHENSIVE METABOLIC PANEL
ALT: 24 U/L (ref 0–44)
AST: 23 U/L (ref 15–41)
Albumin: 4.2 g/dL (ref 3.5–5.0)
Alkaline Phosphatase: 54 U/L (ref 38–126)
Anion gap: 12 (ref 5–15)
BUN: 27 mg/dL — ABNORMAL HIGH (ref 8–23)
CO2: 25 mmol/L (ref 22–32)
Calcium: 9.5 mg/dL (ref 8.9–10.3)
Chloride: 104 mmol/L (ref 98–111)
Creatinine, Ser: 1.94 mg/dL — ABNORMAL HIGH (ref 0.61–1.24)
GFR calc Af Amer: 40 mL/min — ABNORMAL LOW (ref >60)
GFR calc non Af Amer: 34 mL/min — ABNORMAL LOW (ref >60)
Glucose, Bld: 104 mg/dL — ABNORMAL HIGH (ref 70–99)
Potassium: 4.4 mmol/L (ref 3.5–5.1)
Sodium: 141 mmol/L (ref 135–145)
Total Bilirubin: 0.1 mg/dL — ABNORMAL LOW (ref 0.3–1.2)
Total Protein: 7.3 g/dL (ref 6.5–8.1)

## 2019-03-05 LAB — CBC WITH DIFFERENTIAL/PLATELET
Abs Immature Granulocytes: 0 10*3/uL (ref 0.00–0.07)
Basophils Absolute: 0 10*3/uL (ref 0.0–0.1)
Basophils Relative: 0 %
Eosinophils Absolute: 0.2 10*3/uL (ref 0.0–0.5)
Eosinophils Relative: 4 %
HCT: 40 % (ref 39.0–52.0)
Hemoglobin: 12.9 g/dL — ABNORMAL LOW (ref 13.0–17.0)
Immature Granulocytes: 0 %
Lymphocytes Relative: 37 %
Lymphs Abs: 1.5 10*3/uL (ref 0.7–4.0)
MCH: 30.7 pg (ref 26.0–34.0)
MCHC: 32.3 g/dL (ref 30.0–36.0)
MCV: 95.2 fL (ref 80.0–100.0)
Monocytes Absolute: 0.4 10*3/uL (ref 0.1–1.0)
Monocytes Relative: 10 %
Neutro Abs: 2 10*3/uL (ref 1.7–7.7)
Neutrophils Relative %: 49 %
Platelets: 191 10*3/uL (ref 150–400)
RBC: 4.2 MIL/uL — ABNORMAL LOW (ref 4.22–5.81)
RDW: 15.8 % — ABNORMAL HIGH (ref 11.5–15.5)
WBC: 4 10*3/uL (ref 4.0–10.5)
nRBC: 0 % (ref 0.0–0.2)

## 2019-03-05 LAB — TSH: TSH: 8.098 u[IU]/mL — ABNORMAL HIGH (ref 0.350–4.500)

## 2019-03-05 MED ORDER — SODIUM CHLORIDE 0.9 % IV SOLN
200.0000 mg | Freq: Once | INTRAVENOUS | Status: AC
  Administered 2019-03-05: 200 mg via INTRAVENOUS
  Filled 2019-03-05: qty 8

## 2019-03-05 MED ORDER — HYDROCODONE-ACETAMINOPHEN 5-325 MG PO TABS: ORAL_TABLET | ORAL | 0 refills | Status: DC

## 2019-03-05 MED ORDER — SODIUM CHLORIDE 0.9 % IV SOLN
Freq: Once | INTRAVENOUS | Status: AC
  Administered 2019-03-05: 11:00:00 via INTRAVENOUS

## 2019-03-05 MED ORDER — SODIUM CHLORIDE 0.9% FLUSH
10.0000 mL | INTRAVENOUS | Status: DC | PRN
  Administered 2019-03-05: 10 mL
  Filled 2019-03-05: qty 10

## 2019-03-05 MED ORDER — HEPARIN SOD (PORK) LOCK FLUSH 100 UNIT/ML IV SOLN
500.0000 [IU] | Freq: Once | INTRAVENOUS | Status: AC | PRN
  Administered 2019-03-05: 12:00:00 500 [IU]

## 2019-03-05 NOTE — Progress Notes (Signed)
Labs reviewed today at office visit. BUN and Creatinine noted by MD,27/1.94. proceed with treatment today per MD.  Treatment given per orders. Patient tolerated it well without problems. Vitals stable and discharged home from clinic ambulatory. Follow up as scheduled.

## 2019-03-05 NOTE — Patient Instructions (Addendum)
[facility] at [facility] Discharge Instructions  You were seen today by Dr. Katragadda. He went over your recent lab results. Continue treatments every 3 weeks. He will see you back in 6 weeks for labs and follow up.   Thank you for choosing [facility] to provide your oncology and hematology care.  To afford each patient quality time with our provider, please arrive at least 15 minutes before your scheduled appointment time.   If you have a lab appointment with the Cancer Center please come in thru the  Main Entrance and check in at the main information desk  You need to re-schedule your appointment should you arrive 10 or more minutes late.  We strive to give you quality time with our providers, and arriving late affects you and other patients whose appointments are after yours.  Also, if you no show three or more times for appointments you may be dismissed from the clinic at the providers discretion.     Again, thank you for choosing [facility].  Our hope is that these requests will decrease the amount of time that you wait before being seen by our physicians.       _____________________________________________________________  Should you have questions after your visit to  Cancer Center, please contact our office at [phone] between the hours of 8:00 a.m. and 4:30 p.m.  Voicemails left after 4:00 p.m. will not be returned until the following business day.  For prescription refill requests, have your pharmacy contact our office and allow 72 hours.    Cancer Center Support Programs:   > Cancer Support Group  2nd Tuesday of the month 1pm-2pm, Journey Room    

## 2019-03-05 NOTE — Progress Notes (Signed)
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   07/28/2014 Initial Diagnosis   Oropharyngeal cancer   08/03/2014 Imaging   CT CAP- L supraclavicular lymphadenopathy is not completely visualized. This is better seen on the previous neck CT from 07/27/2014. Otherwise, no evidence for metastatic disease in the chest, abdomen, or pelvis.   08/03/2014 Imaging   Bone scan- Uptake at adjacent anterior LEFT 6, 7, 8 ribs likely representing trauma/fractures. Questionable nonspecific increased tracer localization at the posterior RIGHT 8th and 9th ribs, the adjacent nature which raises a a question of trauma as well   08/06/2014 Pathology Results   Dr. Benjamine Mola- Oropharynx, biopsy, Left - INVASIVE SQUAMOUS CELL CARCINOMA.   08/12/2014 Procedure   Dr. Enrique Sack- 1. Multiple extraction of tooth numbers 6, 17, 22, 23, 24, 25, 26, and 27. 3 Quadrants of alveoloplasty   08/17/2014 Pathology Results   PORT and G-TUBE placed by Dr. Carlis Stable.   08/26/2014 PET scan   Large hypermetabolic mass in the left base of tongue. Activity extends across midline to the right base tongue. 2. Intensely hypermetabolic left cervical metastatic lymph nodes. Lymph nodes extend from the left level II position to the left supraclavi   09/01/2014 - 09/22/2014 Chemotherapy   Concurrent chemoradiation with Cisplatin 100 mg/m2 x 2 cycles with Neulasta support. Held cycle #3 d/t renal toxicity.    09/03/2014 - 10/23/2014 Radiation Therapy   Treated in [place], IMRT Isidore Moos).  Base of tongue and bilat neck. Total dose: 70 Gy in 35  fractions. (of note, he did miss several treatments requiring BID dosing towards the end of treatment).    01/25/2015 PET scan   Near complete resolution of metabolic activity at the base of tongue. Minimal residual activity is likely post treatment effect. 2. Complete resolution of metabolic activity above LEFT cervical lymph nodes. No evidence of residual metabolically active    1/42/3953 Procedure   Port-a-cath removed [name])    05/03/2018 - 09/10/2018 Chemotherapy   The patient had palonosetron (ALOXI) injection 0.25 mg, 0.25 mg, Intravenous,  Once, 6 of 6 cycles Administration: 0.25 mg (05/03/2018), 0.25 mg (05/24/2018), 0.25 mg (06/14/2018), 0.25 mg (07/09/2018), 0.25 mg (07/30/2018), 0.25 mg (08/21/2018) pegfilgrastim-cbqv (UDENYCA) injection 6 mg, 6 mg, Subcutaneous, Once, 5 of 5 cycles Administration: 6 mg (05/27/2018), 6 mg (06/17/2018), 6 mg (07/11/2018), 6 mg (08/01/2018), 6 mg (08/23/2018) CARBOplatin (PARAPLATIN) 380 mg in sodium chloride 0.9 % 250 mL chemo infusion, 380 mg (100 % of original dose 381 mg), Intravenous,  Once, 6 of 6 cycles Dose modification:   (original dose 381 mg, Cycle 1),   (original dose 309.5 mg, Cycle 2), 307.5 mg (original dose 309.5 mg, Cycle 5) Administration: 380 mg (05/03/2018), 310 mg (05/24/2018), 310 mg (06/14/2018), 340 mg (07/09/2018), 310 mg (07/30/2018), 350 mg (08/21/2018) PACLitaxel (TAXOL) 330 mg in sodium chloride 0.9 % 500 mL chemo infusion (> 84m/m2), 175 mg/m2 = 330 mg (100 % of original dose 175 mg/m2), Intravenous,  Once, 6 of 6 cycles Dose modification: 175 mg/m2 (original dose 175 mg/m2, Cycle 1, Reason: Patient Age) Administration: 330 mg (05/03/2018), 330 mg (05/24/2018), 330  mg (06/14/2018), 330 mg (07/09/2018), 330 mg (07/30/2018), 330 mg (08/21/2018)  for chemotherapy treatment.    05/24/2018 -  Chemotherapy   The patient had pembrolizumab (KEYTRUDA) 200 mg in sodium chloride 0.9 % 50 mL chemo infusion, 200 mg, Intravenous, Once, 15 of 17 cycles  Administration: 200 mg (05/24/2018), 200 mg (06/14/2018), 200 mg (07/09/2018), 200 mg (08/21/2018), 200 mg (09/11/2018), 200 mg (10/02/2018), 200 mg (10/23/2018), 200 mg (11/13/2018), 200 mg (12/04/2018), 200 mg (12/25/2018), 200 mg (01/22/2019), 200 mg (02/12/2019), 200 mg (03/05/2019), 200 mg (03/26/2019)  for chemotherapy treatment.    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 Wilford 68 y.o. male seen for follow-up and toxicity assessment of metastatic lung cancer.  He is currently receiving pembrolizumab every 3 weeks.  Denies any skin rashes, diarrhea or shortness of breath.  No new onset cough reported.  Appetite and energy levels are 100%.  Pain in the knee is reported as stable at 7 out of 10.  REVIEW OF SYSTEMS:  Review of Systems  Musculoskeletal: Positive for arthralgias.  All other systems reviewed and are negative.    PAST MEDICAL/SURGICAL HISTORY:  Past Medical History:  Diagnosis Date  . GERD (gastroesophageal reflux disease)   . 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 3 weeks ago.- Dr. Oneal [facility] [place], [place].  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 BIOPSY  01/15/2018   Procedure: BIOPSY;  Surgeon: Danie Binder, MD;  [facility];  Service: Endoscopy;;  gastric  . COLONOSCOPY N/A 03/13/2016   Procedure: COLONOSCOPY;  Surgeon: Danie Binder, MD;  [facility];  Service: Endoscopy;  Laterality: N/A;  2:15 PM  . ESOPHAGOGASTRODUODENOSCOPY (EGD) WITH PROPOFOL N/A 08/17/2014   Procedure:  ESOPHAGOGASTRODUODENOSCOPY (EGD) WITH PROPOFOL (procedure #1);  Surgeon: Aviva Signs Md, MD;  Location: [facility];  Service: General;  Laterality: N/A;  . ESOPHAGOGASTRODUODENOSCOPY (EGD) WITH PROPOFOL N/A 01/15/2018   Procedure: ESOPHAGOGASTRODUODENOSCOPY (EGD) WITH PROPOFOL;  Surgeon: Danie Binder, MD;  [facility];  Service: Endoscopy;  Laterality: N/A;  9:30am  . MULTIPLE EXTRACTIONS WITH ALVEOLOPLASTY N/A 08/12/2014   Procedure: Extraction of tooth #'s 6,17,22,23,24,25,26,27 with alveoloplasty;  Surgeon: Lenn Cal, DDS;  Location: [facility];  Service: Oral Surgery;  Laterality: N/A;  . PANENDOSCOPY N/A 08/06/2014   Procedure: PANENDOSCOPY WITH BIOPSY;  Surgeon: Leta Baptist, MD;  Location: [place] [facility] [place];  Service: ENT;  Laterality: N/A;  . PEG PLACEMENT Left 08/17/14  . PEG PLACEMENT N/A 08/17/2014   Procedure: PERCUTANEOUS ENDOSCOPIC GASTROSTOMY (PEG) PLACEMENT (procedure #1);  Surgeon: Aviva Signs Md, MD;  Location: [facility];  Service: General;  Laterality: N/A;  . PORT-A-CATH REMOVAL Right 07/17/2016   Procedure: MINOR REMOVAL PORT-A-CATH;  Surgeon: Aviva Signs, MD;  Location: [facility];  Service: General;  Laterality: Right;  . PORTACATH PLACEMENT Right 08/17/14  . PORTACATH PLACEMENT Right 08/17/2014   Procedure: INSERTION PORT-A-CATH (procedure #2);  Surgeon: Aviva Signs Md, MD;  Location[facility];  Service: General;  Laterality: Right;  .  PORTACATH PLACEMENT Left 04/26/2018   Procedure: INSERTION PORT-A-CATH (attached catheter in left subclavian);  Surgeon: Aviva Signs, MD;  Location[facility];  Service: General;  Laterality: Left;  . SAVORY DILATION N/A 01/15/2018   Procedure: SAVORY DILATION;  Surgeon: Danie Binder, MD;  [facility];  Service: Endoscopy;  Laterality: N/A;  . VIDEO BRONCHOSCOPY WITH ENDOBRONCHIAL ULTRASOUND N/A 04/15/2018   Procedure: VIDEO BRONCHOSCOPY WITH ENDOBRONCHIAL ULTRASOUND;  Surgeon: Melrose Nakayama, MD;  Location: [place];   Service: Thoracic;  Laterality: N/A;     SOCIAL HISTORY:  Social History   Socioeconomic History  . Marital status: Legally Separated    Spouse name: Not on file  . Number of children: 5  . Years of education: Not on file  . Highest education level: Not on file  Occupational History  . Not on file  Tobacco Use  . Smoking status: Former Smoker    Packs/day: 0.50    Years: 30.00    Pack years: 15.00    Quit date: 07/22/2014    Years since quitting: 4.6  . Smokeless tobacco: Never Used  Substance and Sexual Activity  . Alcohol use: Not Currently    Alcohol/week: 0.0 standard drinks    Comment: None currently (11/09/17); previously 1-2 beers on the weekend  . Drug use: No  . Sexual activity: Not on file  Other Topics Concern  . Not on file  Social History Narrative  . Not on file   Social Determinants of Health   Financial Resource Strain:   . Difficulty of Paying Living Expenses: Not on file  Food Insecurity:   . Worried About Charity fundraiser in the Last Year: Not on file  . Ran Out of Food in the Last Year: Not on file  Transportation Needs:   . Lack of Transportation (Medical): Not on file  . Lack of Transportation (Non-Medical): Not on file  Physical Activity:   . Days of Exercise per Week: Not on file  . Minutes of Exercise per Session: Not on file  Stress:   . Feeling of Stress : Not on file  Social Connections:   . Frequency of Communication with Friends and Family: Not on file  . Frequency of Social Gatherings with Friends and Family: Not on file  . Attends Religious Services: Not on file  . Active Member of Clubs or Organizations: Not on file  . Attends Archivist Meetings: Not on file  . Marital Status: Not on file  Intimate Partner Violence:   . Fear of Current or Ex-Partner: Not on file  . Emotionally Abused: Not on file  . Physically Abused: Not on file  . Sexually Abused: Not on file    FAMILY HISTORY:  Family History  Problem  Relation Age of Onset  . Colon cancer Neg Hx   . Gastric cancer Neg Hx   . Esophageal cancer Neg Hx     CURRENT MEDICATIONS:  Outpatient Encounter Medications as of 03/05/2019  Medication Sig  . feeding supplement, ENSURE ENLIVE, (ENSURE ENLIVE) LIQD Take 237 mLs by mouth 2 (two) times daily.  . Melatonin 10 MG TABS Take 1 tablet by mouth at bedtime.  Marland Kitchen omeprazole (PRILOSEC) 20 MG capsule TAKE 1 CAPSULE BY MOUTH 30 MINUTES PRIOR TO BREAKFAST  . Pembrolizumab (KEYTRUDA IV) Inject into the vein every 21 ( twenty-one) days.  . [DISCONTINUED] levothyroxine (SYNTHROID) 50 MCG tablet TAKE 1 TABLET BY MOUTH DAILY BEFORE BREAKAST.  . [DISCONTINUED] HYDROcodone-acetaminophen (NORCO/VICODIN)  5-325 MG tablet TAKE 1 TABLET EVERY 12 HOURS AS NEEDED FOR MODERATE PAIN (Patient not taking: Reported on 03/05/2019)  . [DISCONTINUED] HYDROcodone-acetaminophen (NORCO/VICODIN) 5-325 MG tablet TAKE 1 TABLET EVERY 12 HOURS AS NEEDED FOR MODERATE PAIN  . [DISCONTINUED] prochlorperazine (COMPAZINE) 10 MG tablet Take 1 tablet (10 mg total) by mouth every 6 (six) hours as needed (Nausea or vomiting). (Patient not taking: Reported on 11/13/2018)   No facility-administered encounter medications on file as of 03/05/2019.    ALLERGIES:  No Known Allergies   PHYSICAL EXAM:  ECOG Performance status: 1  Vitals:   03/05/19 0900  BP: 119/67  Pulse: 65  Resp: 18  Temp: (!) 96.8 F (36 C)  SpO2: 100%   Filed Weights   03/05/19 0900  Weight: 150 lb 9.6 oz (68.3 kg)    Physical Exam Vitals signs reviewed.  Constitutional:      Appearance: Normal appearance.  Cardiovascular:     Rate and Rhythm: Normal rate and regular rhythm.     Heart sounds: Normal heart sounds.  Pulmonary:     Effort: Pulmonary effort is normal.     Breath sounds: Normal breath sounds.  Abdominal:     General: There is no distension.     Palpations: Abdomen is soft. There is no mass.  Musculoskeletal:        General: No swelling.   Skin:    General: Skin is warm.  Neurological:     General: No focal deficit present.     Mental Status: He is alert and oriented to person, place, and time.  Psychiatric:        Mood and Affect: Mood normal.        Behavior: Behavior normal.      LABORATORY DATA:  I have reviewed the labs as listed.  CBC    Component Value Date/Time   WBC 4.2 03/26/2019 0856   RBC 4.02 (L) 03/26/2019 0856   HGB 12.3 (L) 03/26/2019 0856   HCT 38.4 (L) 03/26/2019 0856   PLT 179 03/26/2019 0856   MCV 95.5 03/26/2019 0856   MCH 30.6 03/26/2019 0856   MCHC 32.0 03/26/2019 0856   RDW 14.9 03/26/2019 0856   LYMPHSABS 1.5 03/26/2019 0856   MONOABS 0.5 03/26/2019 0856   EOSABS 0.2 03/26/2019 0856   BASOSABS 0.0 03/26/2019 0856   CMP Latest Ref Rng & Units 03/26/2019 03/05/2019 02/12/2019  Glucose 70 - 99 mg/dL 103(H) 104(H) 96  BUN 8 - 23 mg/dL 19 27(H) 30(H)  Creatinine 0.61 - 1.24 mg/dL 2.01(H) 1.94(H) 1.98(H)  Sodium 135 - 145 mmol/L 139 141 142  Potassium 3.5 - 5.1 mmol/L 4.1 4.4 4.0  Chloride 98 - 111 mmol/L 102 104 106  CO2 22 - 32 mmol/L '26 25 25  '$ Calcium 8.9 - 10.3 mg/dL 9.2 9.5 9.2  Total Protein 6.5 - 8.1 g/dL 7.4 7.3 7.2  Total Bilirubin 0.3 - 1.2 mg/dL 0.6 0.1(L) 0.6  Alkaline Phos 38 - 126 U/L 52 54 48  AST 15 - 41 U/L '22 23 23  '$ ALT 0 - 44 U/L '23 24 26       '$ DIAGNOSTIC IMAGING:  I have independently reviewed the scans and discussed with the patient.   I have reviewed Venita Lick LPN's note and agree with the documentation.  I personally performed a face-to-face visit, made revisions and my assessment and plan is as follows.    ASSESSMENT & PLAN:   Squamous cell lung cancer, left ([place]) 1.  Advanced squamous  cell carcinoma of the left lung: - PET scan on 02/18/2018 showed interval development of hypermetabolic mediastinal and left hilar adenopathy associated with small hypermetabolic left lung nodules both in upper and lower lobes.  Persistent hypermetabolic activity  in the right prostate gland. -PDL 1 testing was not done.  Foundation 1 testing shows MS-stable, TMB-10mts/mb, NOTCH 1 Loss, PTEN loss, RB1 loss, FAS loss - 6 cycles of carboplatinum, paclitaxel and pembrolizumab from 05/03/2018 through 08/21/2018. -Maintenance pembrolizumab started on 09/11/2018.   -PET scan on 01/20/2019 reviewed by me showed response to therapy of thoracic nodal metastasis.  No new areas reported. -He is tolerating pembrolizumab without any major immunotherapy related side effects.  We reviewed his labs which showed stable creatinine of 1.94. -He will proceed with pembrolizumab today and in 3 weeks.  I will see him back in 6 weeks for toxicity assessment. -We will plan to repeat scans in February/March 2021.  2.  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 -PET scan did not show any hypermetabolic activity in the neck.  3.  Bilateral knee pains: -He will continue hydrocodone 5/325 1 to 2 tablets daily.  I have given a refill.  4.  Elevated PSA: -No distinct increased prostatic metabolic activity on the current PET/CT scan from 10/14/2018. -Last PSA was 28.6 on 01/14/2019.  5.  Hypothyroidism: -TSH on 01/22/2019 was 6.91.  He is on Synthroid 50 mcg daily. -We will plan to repeat it at next visit.        Orders placed this encounter:  Orders Placed This Encounter  Procedures  . CBC with Differential/Platelet  . Comprehensive metabolic panel  . TSH      SDerek Jack [phone]

## 2019-03-05 NOTE — Patient Instructions (Signed)
[facility] Discharge Instructions  Labs drawn from portacath today   Thank you for choosing  Cancer Center at [facility] to provide your oncology and hematology care.  To afford each patient quality time with our provider, please arrive at least 15 minutes before your scheduled appointment time.   If you have a lab appointment with the Cancer Center please come in thru the Main Entrance and check in at the main information desk.  You need to re-schedule your appointment should you arrive 10 or more minutes late.  We strive to give you quality time with our providers, and arriving late affects you and other patients whose appointments are after yours.  Also, if you no show three or more times for appointments you may be dismissed from the clinic at the providers discretion.     Again, thank you for choosing [facility].  Our hope is that these requests will decrease the amount of time that you wait before being seen by our physicians.       _____________________________________________________________  Should you have questions after your visit to [facility], please contact our office at [phone] between the hours of 8:00 a.m. and 4:30 p.m.  Voicemails left after 4:00 p.m. will not be returned until the following business day.  For prescription refill requests, have your pharmacy contact our office and allow 72 hours.    Due to Covid, you will need to wear a mask upon entering the hospital. If you do not have a mask, a mask will be given to you at the Main Entrance upon arrival. For doctor visits, patients may have 1 support person with them. For treatment visits, patients can not have anyone with them due to social distancing guidelines and our immunocompromised population.     

## 2019-03-05 NOTE — Patient Instructions (Signed)
[facility] Discharge Instructions for Patients Receiving Chemotherapy  Today you received the following chemotherapy agents   To help prevent nausea and vomiting after your treatment, we encourage you to take your nausea medication   If you develop nausea and vomiting that is not controlled by your nausea medication, call the clinic.   BELOW ARE SYMPTOMS THAT SHOULD BE REPORTED IMMEDIATELY:  *FEVER GREATER THAN 100.5 F  *CHILLS WITH OR WITHOUT FEVER  NAUSEA AND VOMITING THAT IS NOT CONTROLLED WITH YOUR NAUSEA MEDICATION  *UNUSUAL SHORTNESS OF BREATH  *UNUSUAL BRUISING OR BLEEDING  TENDERNESS IN MOUTH AND THROAT WITH OR WITHOUT PRESENCE OF ULCERS  *URINARY PROBLEMS  *BOWEL PROBLEMS  UNUSUAL RASH Items with * indicate a potential emergency and should be followed up as soon as possible.  Feel free to call the clinic should you have any questions or concerns. The clinic phone number is [phone].  Please show the CHEMO ALERT CARD at check-in to the Emergency Department and triage nurse.   

## 2019-03-26 ENCOUNTER — Encounter (HOSPITAL_COMMUNITY): Payer: Self-pay

## 2019-03-26 ENCOUNTER — Inpatient Hospital Stay (HOSPITAL_COMMUNITY): Payer: Medicare Other

## 2019-03-26 ENCOUNTER — Other Ambulatory Visit: Payer: Self-pay

## 2019-03-26 ENCOUNTER — Other Ambulatory Visit (HOSPITAL_COMMUNITY): Payer: Self-pay | Admitting: Hematology

## 2019-03-26 VITALS — BP 120/67 | HR 54 | Temp 96.8°F | Resp 18 | Wt 150.4 lb

## 2019-03-26 DIAGNOSIS — E038 Other specified hypothyroidism: Secondary | ICD-10-CM

## 2019-03-26 DIAGNOSIS — C109 Malignant neoplasm of oropharynx, unspecified: Secondary | ICD-10-CM

## 2019-03-26 DIAGNOSIS — Z5112 Encounter for antineoplastic immunotherapy: Secondary | ICD-10-CM | POA: Diagnosis not present

## 2019-03-26 DIAGNOSIS — C3492 Malignant neoplasm of unspecified part of left bronchus or lung: Secondary | ICD-10-CM

## 2019-03-26 LAB — CBC WITH DIFFERENTIAL/PLATELET
Abs Immature Granulocytes: 0 10*3/uL (ref 0.00–0.07)
Basophils Absolute: 0 10*3/uL (ref 0.0–0.1)
Basophils Relative: 0 %
Eosinophils Absolute: 0.2 10*3/uL (ref 0.0–0.5)
Eosinophils Relative: 4 %
HCT: 38.4 % — ABNORMAL LOW (ref 39.0–52.0)
Hemoglobin: 12.3 g/dL — ABNORMAL LOW (ref 13.0–17.0)
Immature Granulocytes: 0 %
Lymphocytes Relative: 36 %
Lymphs Abs: 1.5 10*3/uL (ref 0.7–4.0)
MCH: 30.6 pg (ref 26.0–34.0)
MCHC: 32 g/dL (ref 30.0–36.0)
MCV: 95.5 fL (ref 80.0–100.0)
Monocytes Absolute: 0.5 10*3/uL (ref 0.1–1.0)
Monocytes Relative: 13 %
Neutro Abs: 2 10*3/uL (ref 1.7–7.7)
Neutrophils Relative %: 47 %
Platelets: 179 10*3/uL (ref 150–400)
RBC: 4.02 MIL/uL — ABNORMAL LOW (ref 4.22–5.81)
RDW: 14.9 % (ref 11.5–15.5)
WBC: 4.2 10*3/uL (ref 4.0–10.5)
nRBC: 0 % (ref 0.0–0.2)

## 2019-03-26 LAB — COMPREHENSIVE METABOLIC PANEL
ALT: 23 U/L (ref 0–44)
AST: 22 U/L (ref 15–41)
Albumin: 4 g/dL (ref 3.5–5.0)
Alkaline Phosphatase: 52 U/L (ref 38–126)
Anion gap: 11 (ref 5–15)
BUN: 19 mg/dL (ref 8–23)
CO2: 26 mmol/L (ref 22–32)
Calcium: 9.2 mg/dL (ref 8.9–10.3)
Chloride: 102 mmol/L (ref 98–111)
Creatinine, Ser: 2.01 mg/dL — ABNORMAL HIGH (ref 0.61–1.24)
GFR calc Af Amer: 38 mL/min — ABNORMAL LOW (ref >60)
GFR calc non Af Amer: 33 mL/min — ABNORMAL LOW (ref >60)
Glucose, Bld: 103 mg/dL — ABNORMAL HIGH (ref 70–99)
Potassium: 4.1 mmol/L (ref 3.5–5.1)
Sodium: 139 mmol/L (ref 135–145)
Total Bilirubin: 0.6 mg/dL (ref 0.3–1.2)
Total Protein: 7.4 g/dL (ref 6.5–8.1)

## 2019-03-26 MED ORDER — HEPARIN SOD (PORK) LOCK FLUSH 100 UNIT/ML IV SOLN
500.0000 [IU] | Freq: Once | INTRAVENOUS | Status: AC | PRN
  Administered 2019-03-26: 500 [IU]

## 2019-03-26 MED ORDER — SODIUM CHLORIDE 0.9% FLUSH
10.0000 mL | INTRAVENOUS | Status: DC | PRN
  Administered 2019-03-26: 10 mL

## 2019-03-26 MED ORDER — SODIUM CHLORIDE 0.9 % IV SOLN: Freq: Once | INTRAVENOUS | Status: AC

## 2019-03-26 MED ORDER — SODIUM CHLORIDE 0.9 % IV SOLN
200.0000 mg | Freq: Once | INTRAVENOUS | Status: AC
  Administered 2019-03-26: 10:00:00 200 mg via INTRAVENOUS
  Filled 2019-03-26: qty 8

## 2019-03-26 NOTE — Progress Notes (Signed)
3335 Labs,including Creatinine of 2.01, reviewed with RNester NP and pt approved for Keytruda infusion today per NP                 Alexander [name] tolerated Keytruda infusion well without complaints or incident. VSS upon discharge. Pt discharged self ambulatory in satisfactory condition

## 2019-03-26 NOTE — Patient Instructions (Signed)
[facility] Discharge Instructions for Patients Receiving Chemotherapy   Beginning January 23rd 2017 lab work for the [facility] will be done in the  Main lab at [facility] on 1st floor. If you have a lab appointment with the [facility] please come in thru the  Main Entrance and check in at the main information desk   Today you received the following chemotherapy agents Keytruda. Follow-up as scheduled. Call clinic for any questions or concerns  To help prevent nausea and vomiting after your treatment, we encourage you to take your nausea medication   If you develop nausea and vomiting, or diarrhea that is not controlled by your medication, call the clinic.  The clinic phone number is [phone]. Office hours are Monday-Friday 8:30am-5:00pm.  BELOW ARE SYMPTOMS THAT SHOULD BE REPORTED IMMEDIATELY:  *FEVER GREATER THAN 101.0 F  *CHILLS WITH OR WITHOUT FEVER  NAUSEA AND VOMITING THAT IS NOT CONTROLLED WITH YOUR NAUSEA MEDICATION  *UNUSUAL SHORTNESS OF BREATH  *UNUSUAL BRUISING OR BLEEDING  TENDERNESS IN MOUTH AND THROAT WITH OR WITHOUT PRESENCE OF ULCERS  *URINARY PROBLEMS  *BOWEL PROBLEMS  UNUSUAL RASH Items with * indicate a potential emergency and should be followed up as soon as possible. If you have an emergency after office hours please contact your primary care physician or go to the nearest emergency department.  Please call the clinic during office hours if you have any questions or concerns.   You may also contact the Patient Navigator at [phone] should you have any questions or need assistance in obtaining follow up care.      Resources For Cancer Patients and their Caregivers ? American Cancer Society: Can assist with transportation, wigs, general needs, runs Look Good Feel Better.        813-560-5141 ? Cancer Care: Provides financial assistance, online support groups, medication/co-pay assistance.  1-800-813-HOPE  913-534-6106) ? [facility] Assists [place] cancer patients and their families through emotional , educational and financial support.  (769)249-4405 ? [place] DSS Where to apply for food stamps, Medicaid and utility assistance. 581 303 1080 ? [facility]: Transportation to medical appointments. 4324794356 ? Social Security Administration: May apply for disability if have a Stage IV cancer. 978-472-3092 7264094886 ? [employer], Disability and Transit Services: Assists with nutrition, care and transit needs. [phone]

## 2019-03-27 ENCOUNTER — Other Ambulatory Visit (HOSPITAL_COMMUNITY): Payer: Self-pay | Admitting: Nurse Practitioner

## 2019-03-27 DIAGNOSIS — C3492 Malignant neoplasm of unspecified part of left bronchus or lung: Secondary | ICD-10-CM

## 2019-03-27 MED ORDER — HYDROCODONE-ACETAMINOPHEN 5-325 MG PO TABS: ORAL_TABLET | ORAL | 0 refills | Status: DC

## 2019-03-30 ENCOUNTER — Encounter (HOSPITAL_COMMUNITY): Payer: Self-pay | Admitting: Hematology

## 2019-03-30 NOTE — Assessment & Plan Note (Signed)
1.  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  Persistent hypermetabolic activity in the right prostate gland. -PDL 1 testing was not done.  Foundation 1 testing shows MS-stable, TMB-53mts/mb, NOTCH 1 Loss, PTEN loss, RB1 loss, FAS loss - 6 cycles of carboplatinum, paclitaxel and pembrolizumab from 05/03/2018 through 08/21/2018. -Maintenance pembrolizumab started on 09/11/2018.   -PET scan on 01/20/2019 reviewed by me showed response to therapy of thoracic nodal metastasis.  No new areas reported. -He is tolerating pembrolizumab without any major immunotherapy related side effects.  We reviewed his labs which showed stable creatinine of 1.94. -He will proceed with pembrolizumab today and in 3 weeks.  I will see him back in 6 weeks for toxicity assessment. -We will plan to repeat scans in February/March 2021.  2.  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 -PET scan did not show any hypermetabolic activity in the neck.  3.  Bilateral knee pains: -He will continue hydrocodone 5/325 1 to 2 tablets daily.  I have given a refill.  4.  Elevated PSA: -No distinct increased prostatic metabolic activity on the current PET/CT scan from 10/14/2018. -Last PSA was 28.6 on 01/14/2019.  5.  Hypothyroidism: -TSH on 01/22/2019 was 6.91.  He is on Synthroid 50 mcg daily. -We will plan to repeat it at next visit.

## 2019-04-16 ENCOUNTER — Other Ambulatory Visit (HOSPITAL_COMMUNITY): Payer: Self-pay | Admitting: *Deleted

## 2019-04-16 ENCOUNTER — Inpatient Hospital Stay (HOSPITAL_BASED_OUTPATIENT_CLINIC_OR_DEPARTMENT_OTHER): Payer: Medicare HMO | Admitting: Hematology

## 2019-04-16 ENCOUNTER — Other Ambulatory Visit: Payer: Self-pay

## 2019-04-16 ENCOUNTER — Inpatient Hospital Stay (HOSPITAL_COMMUNITY): Payer: Medicare HMO | Attending: Hematology

## 2019-04-16 ENCOUNTER — Inpatient Hospital Stay (HOSPITAL_COMMUNITY): Payer: Medicare HMO

## 2019-04-16 VITALS — BP 109/61 | HR 54 | Temp 96.9°F | Resp 18

## 2019-04-16 VITALS — BP 97/56 | HR 62 | Temp 97.9°F | Resp 18 | Wt 148.0 lb

## 2019-04-16 DIAGNOSIS — E039 Hypothyroidism, unspecified: Secondary | ICD-10-CM | POA: Diagnosis not present

## 2019-04-16 DIAGNOSIS — Z85818 Personal history of malignant neoplasm of other sites of lip, oral cavity, and pharynx: Secondary | ICD-10-CM | POA: Insufficient documentation

## 2019-04-16 DIAGNOSIS — Z87891 Personal history of nicotine dependence: Secondary | ICD-10-CM | POA: Insufficient documentation

## 2019-04-16 DIAGNOSIS — C3492 Malignant neoplasm of unspecified part of left bronchus or lung: Secondary | ICD-10-CM

## 2019-04-16 DIAGNOSIS — Z9221 Personal history of antineoplastic chemotherapy: Secondary | ICD-10-CM | POA: Diagnosis not present

## 2019-04-16 DIAGNOSIS — M25561 Pain in right knee: Secondary | ICD-10-CM | POA: Insufficient documentation

## 2019-04-16 DIAGNOSIS — M25562 Pain in left knee: Secondary | ICD-10-CM | POA: Diagnosis not present

## 2019-04-16 DIAGNOSIS — Z5112 Encounter for antineoplastic immunotherapy: Secondary | ICD-10-CM | POA: Insufficient documentation

## 2019-04-16 DIAGNOSIS — E038 Other specified hypothyroidism: Secondary | ICD-10-CM | POA: Diagnosis not present

## 2019-04-16 DIAGNOSIS — C109 Malignant neoplasm of oropharynx, unspecified: Secondary | ICD-10-CM

## 2019-04-16 DIAGNOSIS — Z79899 Other long term (current) drug therapy: Secondary | ICD-10-CM | POA: Insufficient documentation

## 2019-04-16 DIAGNOSIS — C3482 Malignant neoplasm of overlapping sites of left bronchus and lung: Secondary | ICD-10-CM | POA: Diagnosis present

## 2019-04-16 DIAGNOSIS — R972 Elevated prostate specific antigen [PSA]: Secondary | ICD-10-CM | POA: Diagnosis not present

## 2019-04-16 DIAGNOSIS — C771 Secondary and unspecified malignant neoplasm of intrathoracic lymph nodes: Secondary | ICD-10-CM | POA: Diagnosis not present

## 2019-04-16 DIAGNOSIS — K219 Gastro-esophageal reflux disease without esophagitis: Secondary | ICD-10-CM | POA: Diagnosis not present

## 2019-04-16 DIAGNOSIS — Z923 Personal history of irradiation: Secondary | ICD-10-CM | POA: Insufficient documentation

## 2019-04-16 DIAGNOSIS — C01 Malignant neoplasm of base of tongue: Secondary | ICD-10-CM | POA: Insufficient documentation

## 2019-04-16 LAB — CBC WITH DIFFERENTIAL/PLATELET
Abs Immature Granulocytes: 0 10*3/uL (ref 0.00–0.07)
Basophils Absolute: 0 10*3/uL (ref 0.0–0.1)
Basophils Relative: 0 %
Eosinophils Absolute: 0.1 10*3/uL (ref 0.0–0.5)
Eosinophils Relative: 4 %
HCT: 38.9 % — ABNORMAL LOW (ref 39.0–52.0)
Hemoglobin: 12.6 g/dL — ABNORMAL LOW (ref 13.0–17.0)
Immature Granulocytes: 0 %
Lymphocytes Relative: 34 %
Lymphs Abs: 1.2 10*3/uL (ref 0.7–4.0)
MCH: 31.1 pg (ref 26.0–34.0)
MCHC: 32.4 g/dL (ref 30.0–36.0)
MCV: 96 fL (ref 80.0–100.0)
Monocytes Absolute: 0.5 10*3/uL (ref 0.1–1.0)
Monocytes Relative: 13 %
Neutro Abs: 1.8 10*3/uL (ref 1.7–7.7)
Neutrophils Relative %: 49 %
Platelets: 190 10*3/uL (ref 150–400)
RBC: 4.05 MIL/uL — ABNORMAL LOW (ref 4.22–5.81)
RDW: 15.1 % (ref 11.5–15.5)
WBC: 3.6 10*3/uL — ABNORMAL LOW (ref 4.0–10.5)
nRBC: 0 % (ref 0.0–0.2)

## 2019-04-16 LAB — COMPREHENSIVE METABOLIC PANEL
ALT: 23 U/L (ref 0–44)
AST: 22 U/L (ref 15–41)
Albumin: 4 g/dL (ref 3.5–5.0)
Alkaline Phosphatase: 55 U/L (ref 38–126)
Anion gap: 12 (ref 5–15)
BUN: 25 mg/dL — ABNORMAL HIGH (ref 8–23)
CO2: 27 mmol/L (ref 22–32)
Calcium: 9.4 mg/dL (ref 8.9–10.3)
Chloride: 103 mmol/L (ref 98–111)
Creatinine, Ser: 1.94 mg/dL — ABNORMAL HIGH (ref 0.61–1.24)
GFR calc Af Amer: 40 mL/min — ABNORMAL LOW (ref >60)
GFR calc non Af Amer: 34 mL/min — ABNORMAL LOW (ref >60)
Glucose, Bld: 85 mg/dL (ref 70–99)
Potassium: 4.4 mmol/L (ref 3.5–5.1)
Sodium: 142 mmol/L (ref 135–145)
Total Bilirubin: 0.3 mg/dL (ref 0.3–1.2)
Total Protein: 6.9 g/dL (ref 6.5–8.1)

## 2019-04-16 LAB — TSH: TSH: 5.877 u[IU]/mL — ABNORMAL HIGH (ref 0.350–4.500)

## 2019-04-16 MED ORDER — SODIUM CHLORIDE 0.9% FLUSH
10.0000 mL | INTRAVENOUS | Status: DC | PRN
  Administered 2019-04-16: 09:00:00 10 mL

## 2019-04-16 MED ORDER — SODIUM CHLORIDE 0.9 % IV SOLN
200.0000 mg | Freq: Once | INTRAVENOUS | Status: AC
  Administered 2019-04-16: 10:00:00 200 mg via INTRAVENOUS
  Filled 2019-04-16: qty 8

## 2019-04-16 MED ORDER — HEPARIN SOD (PORK) LOCK FLUSH 100 UNIT/ML IV SOLN
500.0000 [IU] | Freq: Once | INTRAVENOUS | Status: AC | PRN
  Administered 2019-04-16: 500 [IU]

## 2019-04-16 MED ORDER — SODIUM CHLORIDE 0.9 % IV SOLN: Freq: Once | INTRAVENOUS | Status: AC

## 2019-04-16 MED ORDER — HYDROCODONE-ACETAMINOPHEN 5-325 MG PO TABS: ORAL_TABLET | ORAL | 0 refills | Status: DC

## 2019-04-16 MED ORDER — LEVOTHYROXINE SODIUM 75 MCG PO TABS: 75.0000 ug | ORAL_TABLET | Freq: Every day | ORAL | 1 refills | Status: DC

## 2019-04-16 NOTE — Progress Notes (Signed)
Labs reviewed today by MD. Creatinine is 1.94. MD aware. Proceed with treatment today per MD.   Treatment given per orders. Patient tolerated it well without problems. Vitals stable and discharged home from clinic ambulatory. Follow up as scheduled.

## 2019-04-16 NOTE — Patient Instructions (Signed)
[facility] at [facility] Discharge Instructions  Labs drawn from portacath today   Thank you for choosing [facility] at [facility] to provide your oncology and hematology care.  To afford each patient quality time with our provider, please arrive at least 15 minutes before your scheduled appointment time.   If you have a lab appointment with the Cancer Center please come in thru the Main Entrance and check in at the main information desk.  You need to re-schedule your appointment should you arrive 10 or more minutes late.  We strive to give you quality time with our providers, and arriving late affects you and other patients whose appointments are after yours.  Also, if you no show three or more times for appointments you may be dismissed from the clinic at the providers discretion.     Again, thank you for choosing [facility].  Our hope is that these requests will decrease the amount of time that you wait before being seen by our physicians.       _____________________________________________________________  Should you have questions after your visit to [facility], please contact our office at [phone] between the hours of 8:00 a.m. and 4:30 p.m.  Voicemails left after 4:00 p.m. will not be returned until the following business day.  For prescription refill requests, have your pharmacy contact our office and allow 72 hours.    Due to Covid, you will need to wear a mask upon entering the hospital. If you do not have a mask, a mask will be given to you at the Main Entrance upon arrival. For doctor visits, patients may have 1 support person with them. For treatment visits, patients can not have anyone with them due to social distancing guidelines and our immunocompromised population.     

## 2019-04-16 NOTE — Assessment & Plan Note (Signed)
1.  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  Persistent hypermetabolic activity in the right prostate gland. -PDL 1 testing was not done.  Foundation 1 testing shows MS-stable, TMB-58mts/mb, NOTCH 1 Loss, PTEN loss, RB1 loss, FAS loss - 6 cycles of carboplatinum, paclitaxel and pembrolizumab from 05/03/2018 through 08/21/2018. -Maintenance pembrolizumab started on 09/11/2018.   -PET scan on 01/20/2019 reviewed by me showed response to therapy of thoracic nodal metastasis.  No new areas reported. -He denies any immunotherapy related side effects. I have reviewed his labs. He will proceed with his next cycle of pembrolizumab. -We will see him back in 3 weeks for follow-up. I plan to repeat PET scan in 6 weeks.  2.  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 -PET scan did not show any hypermetabolic activity in the neck.  3.  Bilateral knee pains: -He is using hydrocodone 5/325 1 to 2 tablets daily. We have given refill.  4.  Elevated PSA: -No distinct increased prostatic metabolic activity on the current PET/CT scan from 10/14/2018. -Last PSA was 28.6 on 01/14/2019.  5.  Hypothyroidism: -He is on Synthroid 50 mcg daily. TSH on 03/05/2019 was 8.098. -We will increase Synthroid to 75 mcg.

## 2019-04-16 NOTE — Patient Instructions (Signed)
[facility] Discharge Instructions for Patients Receiving Chemotherapy  Today you received the following chemotherapy agents   To help prevent nausea and vomiting after your treatment, we encourage you to take your nausea medication   If you develop nausea and vomiting that is not controlled by your nausea medication, call the clinic.   BELOW ARE SYMPTOMS THAT SHOULD BE REPORTED IMMEDIATELY:  *FEVER GREATER THAN 100.5 F  *CHILLS WITH OR WITHOUT FEVER  NAUSEA AND VOMITING THAT IS NOT CONTROLLED WITH YOUR NAUSEA MEDICATION  *UNUSUAL SHORTNESS OF BREATH  *UNUSUAL BRUISING OR BLEEDING  TENDERNESS IN MOUTH AND THROAT WITH OR WITHOUT PRESENCE OF ULCERS  *URINARY PROBLEMS  *BOWEL PROBLEMS  UNUSUAL RASH Items with * indicate a potential emergency and should be followed up as soon as possible.  Feel free to call the clinic should you have any questions or concerns. The clinic phone number is [phone].  Please show the CHEMO ALERT CARD at check-in to the Emergency Department and triage nurse.   

## 2019-04-16 NOTE — Progress Notes (Signed)
Patient has been assessed, vital signs and labs have been reviewed by Dr. Delton Coombes. ANC,  LFTs, and Platelets are within treatment parameters, Creatinine is 1.94 today which is stable per Dr. Delton Coombes. The patient is good to proceed with treatment at this time.

## 2019-04-16 NOTE — Patient Instructions (Signed)
[place] at [facility] Discharge Instructions  You were seen today by Dr. Delton Coombes. He went over your recent lab results. He will repeat your PET scan in 6 weeks. He will see you back in 3 weeks for labs, treatment and follow up.   Thank you for choosing [place] at [facility] to provide your oncology and hematology care.  To afford each patient quality time with our provider, please arrive at least 15 minutes before your scheduled appointment time.   If you have a lab appointment with the [facility] please come in thru the  Main Entrance and check in at the main information desk  You need to re-schedule your appointment should you arrive 10 or more minutes late.  We strive to give you quality time with our providers, and arriving late affects you and other patients whose appointments are after yours.  Also, if you no show three or more times for appointments you may be dismissed from the clinic at the providers discretion.     Again, thank you for choosing [facility].  Our hope is that these requests will decrease the amount of time that you wait before being seen by our physicians.       _____________________________________________________________  Should you have questions after your visit to [facility], please contact our office at [phone] between the hours of 8:00 a.m. and 4:30 p.m.  Voicemails left after 4:00 p.m. will not be returned until the following business day.  For prescription refill requests, have your pharmacy contact our office and allow 72 hours.    Cancer Center Support Programs:   > Cancer Support Group  2nd Tuesday of the month 1pm-2pm, Journey Room

## 2019-04-16 NOTE — Progress Notes (Signed)
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   07/28/2014 Initial Diagnosis   Oropharyngeal cancer   08/03/2014 Imaging   CT CAP- L supraclavicular lymphadenopathy is not completely visualized. This is better seen on the previous neck CT from 07/27/2014. Otherwise, no evidence for metastatic disease in the chest, abdomen, or pelvis.   08/03/2014 Imaging   Bone scan- Uptake at adjacent anterior LEFT 6, 7, 8 ribs likely representing trauma/fractures. Questionable nonspecific increased tracer localization at the posterior RIGHT 8th and 9th ribs, the adjacent nature which raises a a question of trauma as well   08/06/2014 Pathology Results   Dr. Benjamine Mola- Oropharynx, biopsy, Left - INVASIVE SQUAMOUS CELL CARCINOMA.   08/12/2014 Procedure   Dr. Enrique Sack- 1. Multiple extraction of tooth numbers 6, 17, 22, 23, 24, 25, 26, and 27. 3 Quadrants of alveoloplasty   08/17/2014 Pathology Results   PORT and G-TUBE placed by Dr. Carlis Stable.   08/26/2014 PET scan   Large hypermetabolic mass in the left base of tongue. Activity extends across midline to the right base tongue. 2. Intensely hypermetabolic left cervical metastatic lymph nodes. Lymph nodes extend from the left level II position to the left supraclavi   09/01/2014 - 09/22/2014 Chemotherapy   Concurrent chemoradiation with Cisplatin 100 mg/m2 x 2 cycles with Neulasta support. Held cycle #3 d/t renal toxicity.    09/03/2014 - 10/23/2014 Radiation Therapy   Treated in [place], IMRT Isidore Moos).  Base of tongue and bilat neck. Total dose: 70 Gy in 35  fractions. (of note, he did miss several treatments requiring BID dosing towards the end of treatment).    01/25/2015 PET scan   Near complete resolution of metabolic activity at the base of tongue. Minimal residual activity is likely post treatment effect. 2. Complete resolution of metabolic activity above LEFT cervical lymph nodes. No evidence of residual metabolically active    1/42/3953 Procedure   Port-a-cath removed [name])    05/03/2018 - 09/10/2018 Chemotherapy   The patient had palonosetron (ALOXI) injection 0.25 mg, 0.25 mg, Intravenous,  Once, 6 of 6 cycles Administration: 0.25 mg (05/03/2018), 0.25 mg (05/24/2018), 0.25 mg (06/14/2018), 0.25 mg (07/09/2018), 0.25 mg (07/30/2018), 0.25 mg (08/21/2018) pegfilgrastim-cbqv (UDENYCA) injection 6 mg, 6 mg, Subcutaneous, Once, 5 of 5 cycles Administration: 6 mg (05/27/2018), 6 mg (06/17/2018), 6 mg (07/11/2018), 6 mg (08/01/2018), 6 mg (08/23/2018) CARBOplatin (PARAPLATIN) 380 mg in sodium chloride 0.9 % 250 mL chemo infusion, 380 mg (100 % of original dose 381 mg), Intravenous,  Once, 6 of 6 cycles Dose modification:   (original dose 381 mg, Cycle 1),   (original dose 309.5 mg, Cycle 2), 307.5 mg (original dose 309.5 mg, Cycle 5) Administration: 380 mg (05/03/2018), 310 mg (05/24/2018), 310 mg (06/14/2018), 340 mg (07/09/2018), 310 mg (07/30/2018), 350 mg (08/21/2018) PACLitaxel (TAXOL) 330 mg in sodium chloride 0.9 % 500 mL chemo infusion (> 84m/m2), 175 mg/m2 = 330 mg (100 % of original dose 175 mg/m2), Intravenous,  Once, 6 of 6 cycles Dose modification: 175 mg/m2 (original dose 175 mg/m2, Cycle 1, Reason: Patient Age) Administration: 330 mg (05/03/2018), 330 mg (05/24/2018), 330  mg (06/14/2018), 330 mg (07/09/2018), 330 mg (07/30/2018), 330 mg (08/21/2018)  for chemotherapy treatment.    05/24/2018 -  Chemotherapy   The patient had pembrolizumab (KEYTRUDA) 200 mg in sodium chloride 0.9 % 50 mL chemo infusion, 200 mg, Intravenous, Once, 16 of 17  cycles Administration: 200 mg (05/24/2018), 200 mg (06/14/2018), 200 mg (07/09/2018), 200 mg (08/21/2018), 200 mg (09/11/2018), 200 mg (10/02/2018), 200 mg (10/23/2018), 200 mg (11/13/2018), 200 mg (12/04/2018), 200 mg (12/25/2018), 200 mg (01/22/2019), 200 mg (02/12/2019), 200 mg (03/05/2019), 200 mg (03/26/2019), 200 mg (04/16/2019)  for chemotherapy treatment.    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 male seen for follow-up and toxicity assessment of metastatic lung cancer. He is receiving pembrolizumab every 3 weeks. Appetite and energy levels are 100%. Denies any nausea vomiting or diarrhea. Denies any skin rashes. Knee pains have been stable and managed with hydrocodone.  REVIEW OF SYSTEMS:  Review of Systems  Musculoskeletal: Positive for arthralgias.  All other systems reviewed and are negative.    PAST MEDICAL/SURGICAL HISTORY:  Past Medical History:  Diagnosis Date  . GERD (gastroesophageal reflux disease)   . 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 Oropharyngeal cancer ([place]) 07/28/2014   dx. 3 weeks ago.- Dr. [facility] [place], [place].  Marland Kitchen 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[place]) 08/06/14   SCCa of Left BOT   Past Surgical History:  Procedure Laterality Date  . BIOPSY  01/15/2018   Procedure: BIOPSY;  Surgeon: Danie Binder, MD;  [facility];  Service: Endoscopy;;  gastric  . COLONOSCOPY N/A 03/13/2016   Procedure: COLONOSCOPY;  Surgeon: Danie Binder, MD;  [facility];  Service: Endoscopy;  Laterality: N/A;  2:15 PM  . ESOPHAGOGASTRODUODENOSCOPY (EGD) WITH PROPOFOL N/A 08/17/2014   Procedure:  ESOPHAGOGASTRODUODENOSCOPY (EGD) WITH PROPOFOL (procedure #1);  Surgeon: Aviva Signs Md, MD;  Location: [facility];  Service: General;  Laterality: N/A;  . ESOPHAGOGASTRODUODENOSCOPY (EGD) WITH PROPOFOL N/A 01/15/2018   Procedure: ESOPHAGOGASTRODUODENOSCOPY (EGD) WITH PROPOFOL;  Surgeon: Danie Binder, MD;  [facility];  Service: Endoscopy;  Laterality: N/A;  9:30am  . MULTIPLE EXTRACTIONS WITH ALVEOLOPLASTY N/A 08/12/2014   Procedure: Extraction of tooth #'s 6,17,22,23,24,25,26,27 with alveoloplasty;  Surgeon: Lenn Cal, DDS;  Location: [facility];  Service: Oral Surgery;  Laterality: N/A;  . PANENDOSCOPY N/A 08/06/2014   Procedure: PANENDOSCOPY WITH BIOPSY;  Surgeon: Leta Baptist, MD;  Location: [facility];  Service: ENT;  Laterality: N/A;  . PEG PLACEMENT Left 08/17/14  . PEG PLACEMENT N/A 08/17/2014   Procedure: PERCUTANEOUS ENDOSCOPIC GASTROSTOMY (PEG) PLACEMENT (procedure #1);  Surgeon: Aviva Signs Md, MD;  Location: [facility];  Service: General;  Laterality: N/A;  . PORT-A-CATH REMOVAL Right 07/17/2016   Procedure: MINOR REMOVAL PORT-A-CATH;  Surgeon: Aviva Signs, MD;  Location: [facility];  Service: General;  Laterality: Right;  . PORTACATH PLACEMENT Right 08/17/14  . PORTACATH PLACEMENT Right 08/17/2014   Procedure: INSERTION PORT-A-CATH (procedure #2);  Surgeon: Aviva Signs Md, MD;  Location: [facility];  Service: General;  Laterality: Right;  . PORTACATH PLACEMENT Left 04/26/2018   Procedure: INSERTION PORT-A-CATH (attached catheter  in left subclavian);  Surgeon: Aviva Signs, MD;  Location: [facility];  Service: General;  Laterality: Left;  . SAVORY DILATION N/A 01/15/2018   Procedure: SAVORY DILATION;  Surgeon: Danie Binder, MD;  [facility];  Service: Endoscopy;  Laterality: N/A;  . VIDEO BRONCHOSCOPY WITH ENDOBRONCHIAL ULTRASOUND N/A 04/15/2018   Procedure: VIDEO BRONCHOSCOPY WITH ENDOBRONCHIAL ULTRASOUND;  Surgeon: Melrose Nakayama, MD;  Location: [facility];   Service: Thoracic;  Laterality: N/A;     SOCIAL HISTORY:  Social History   Socioeconomic History  . Marital status: Legally Separated    Spouse name: Not on file  . Number of children: 5  . Years of education: Not on file  . Highest education level: Not on file  Occupational History  . Not on file  Tobacco Use  . Smoking status: Former Smoker    Packs/day: 0.50    Years: 30.00    Pack years: 15.00    Quit date: 07/22/2014    Years since quitting: 4.7  . Smokeless tobacco: Never Used  Substance and Sexual Activity  . Alcohol use: Not Currently    Alcohol/week: 0.0 standard drinks    Comment: None currently (11/09/17); previously 1-2 beers on the weekend  . Drug use: No  . Sexual activity: Not on file  Other Topics Concern  . Not on file  Social History Narrative  . Not on file   Social Determinants of Health   Financial Resource Strain:   . Difficulty of Paying Living Expenses: Not on file  Food Insecurity:   . Worried About Charity fundraiser in the Last Year: Not on file  . Ran Out of Food in the Last Year: Not on file  Transportation Needs:   . Lack of Transportation (Medical): Not on file  . Lack of Transportation (Non-Medical): Not on file  Physical Activity:   . Days of Exercise per Week: Not on file  . Minutes of Exercise per Session: Not on file  Stress:   . Feeling of Stress : Not on file  Social Connections:   . Frequency of Communication with Friends and Family: Not on file  . Frequency of Social Gatherings with Friends and Family: Not on file  . Attends Religious Services: Not on file  . Active Member of Clubs or Organizations: Not on file  . Attends Archivist Meetings: Not on file  . Marital Status: Not on file  Intimate Partner Violence:   . Fear of Current or Ex-Partner: Not on file  . Emotionally Abused: Not on file  . Physically Abused: Not on file  . Sexually Abused: Not on file    FAMILY HISTORY:  Family History  Problem  Relation Age of Onset  . Colon cancer Neg Hx   . Gastric cancer Neg Hx   . Esophageal cancer Neg Hx     CURRENT MEDICATIONS:  Outpatient Encounter Medications as of 04/16/2019  Medication Sig  . feeding supplement, ENSURE ENLIVE, (ENSURE ENLIVE) LIQD Take 237 mLs by mouth 2 (two) times daily.  . Melatonin 10 MG TABS Take 1 tablet by mouth at bedtime.  Marland Kitchen omeprazole (PRILOSEC) 20 MG capsule TAKE 1 CAPSULE BY MOUTH 30 MINUTES PRIOR TO BREAKFAST  . Pembrolizumab (KEYTRUDA IV) Inject into the vein every 21 ( twenty-one) days.  . [DISCONTINUED] levothyroxine (SYNTHROID) 50 MCG tablet TAKE 1 TABLET BY MOUTH DAILY BEFORE BREAKAST.  Marland Kitchen levothyroxine (SYNTHROID) 75 MCG tablet Take 1 tablet (75 mcg total) by mouth daily  before breakfast.  . [DISCONTINUED] HYDROcodone-acetaminophen (NORCO/VICODIN) 5-325 MG tablet TAKE 1 TABLET EVERY 12 HOURS AS NEEDED FOR MODERATE PAIN  . [DISCONTINUED] prochlorperazine (COMPAZINE) 10 MG tablet Take 1 tablet (10 mg total) by mouth every 6 (six) hours as needed (Nausea or vomiting). (Patient not taking: Reported on 11/13/2018)   No facility-administered encounter medications on file as of 04/16/2019.    ALLERGIES:  No Known Allergies   PHYSICAL EXAM:  ECOG Performance status: 1  Vitals:   04/16/19 0827  BP: (!) 97/56  Pulse: 62  Resp: 18  Temp: 97.9 F (36.6 C)  SpO2: 99%   Filed Weights   04/16/19 0827  Weight: 148 lb (67.1 kg)    Physical Exam Vitals reviewed.  Constitutional:      Appearance: Normal appearance.  Cardiovascular:     Rate and Rhythm: Normal rate and regular rhythm.     Heart sounds: Normal heart sounds.  Pulmonary:     Effort: Pulmonary effort is normal.     Breath sounds: Normal breath sounds.  Abdominal:     General: There is no distension.     Palpations: Abdomen is soft. There is no mass.  Musculoskeletal:        General: No swelling.  Skin:    General: Skin is warm.  Neurological:     General: No focal deficit  present.     Mental Status: He is alert and oriented to person, place, and time.  Psychiatric:        Mood and Affect: Mood normal.        Behavior: Behavior normal.      LABORATORY DATA:  I have reviewed the labs as listed.  CBC    Component Value Date/Time   WBC 3.6 (L) 04/16/2019 0822   RBC 4.05 (L) 04/16/2019 0822   HGB 12.6 (L) 04/16/2019 0822   HCT 38.9 (L) 04/16/2019 0822   PLT 190 04/16/2019 0822   MCV 96.0 04/16/2019 0822   MCH 31.1 04/16/2019 0822   MCHC 32.4 04/16/2019 0822   RDW 15.1 04/16/2019 0822   LYMPHSABS 1.2 04/16/2019 0822   MONOABS 0.5 04/16/2019 0822   EOSABS 0.1 04/16/2019 0822   BASOSABS 0.0 04/16/2019 0822   CMP Latest Ref Rng & Units 04/16/2019 03/26/2019 03/05/2019  Glucose 70 - 99 mg/dL 85 103(H) 104(H)  BUN 8 - 23 mg/dL 25(H) 19 27(H)  Creatinine 0.61 - 1.24 mg/dL 1.94(H) 2.01(H) 1.94(H)  Sodium 135 - 145 mmol/L 142 139 141  Potassium 3.5 - 5.1 mmol/L 4.4 4.1 4.4  Chloride 98 - 111 mmol/L 103 102 104  CO2 22 - 32 mmol/L '27 26 25  '$ Calcium 8.9 - 10.3 mg/dL 9.4 9.2 9.5  Total Protein 6.5 - 8.1 g/dL 6.9 7.4 7.3  Total Bilirubin 0.3 - 1.2 mg/dL 0.3 0.6 0.1(L)  Alkaline Phos 38 - 126 U/L 55 52 54  AST 15 - 41 U/L '22 22 23  '$ ALT 0 - 44 U/L '23 23 24       '$ DIAGNOSTIC IMAGING:  I have independently reviewed the scans and discussed with the patient.   I have reviewed Venita Lick LPN's note and agree with the documentation.  I personally performed a face-to-face visit, made revisions and my assessment and plan is as follows.    ASSESSMENT & PLAN:   Squamous cell lung cancer, left ([place]) 1.  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  Persistent hypermetabolic activity in the right prostate gland. -PDL 1 testing was not done.  Foundation 1  testing shows MS-stable, TMB-74mts/mb, NOTCH 1 Loss, PTEN loss, RB1 loss, FAS loss - 6 cycles of carboplatinum, paclitaxel and pembrolizumab from 05/03/2018 through 08/21/2018. -Maintenance pembrolizumab started on 09/11/2018.   -PET scan on 01/20/2019 reviewed by me showed response to therapy of thoracic nodal metastasis.  No new areas reported. -He denies any immunotherapy related side effects. I have reviewed his labs. He will proceed with his next cycle of pembrolizumab. -We will see him back in 3 weeks for follow-up. I plan to repeat PET scan in 6 weeks.  2.  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 -PET scan did not show any hypermetabolic activity in the neck.  3.  Bilateral knee pains: -He is using hydrocodone 5/325 1 to 2 tablets daily. We have given refill.  4.  Elevated PSA: -No distinct increased prostatic metabolic activity on the current PET/CT scan from 10/14/2018. -Last PSA was 28.6 on 01/14/2019.  5.  Hypothyroidism: -He is on Synthroid 50 mcg daily. TSH on 03/05/2019 was 8.098. -We will increase Synthroid to 75 mcg.        Orders placed this encounter:  Orders Placed This Encounter  Procedures  . NM PET Image Restag (PS) Skull Base To Thigh  . CBC with Differential/Platelet  . Comprehensive metabolic panel  . TSH      SDerek Jack [phone]

## 2019-04-29 ENCOUNTER — Encounter: Payer: Self-pay | Admitting: Gastroenterology

## 2019-05-07 ENCOUNTER — Inpatient Hospital Stay (HOSPITAL_COMMUNITY): Payer: Medicare HMO | Attending: Hematology

## 2019-05-07 ENCOUNTER — Encounter (HOSPITAL_COMMUNITY): Payer: Self-pay

## 2019-05-07 ENCOUNTER — Other Ambulatory Visit: Payer: Self-pay

## 2019-05-07 ENCOUNTER — Inpatient Hospital Stay (HOSPITAL_COMMUNITY): Payer: Medicare HMO

## 2019-05-07 VITALS — BP 107/54 | HR 57 | Temp 98.0°F | Resp 18

## 2019-05-07 DIAGNOSIS — C109 Malignant neoplasm of oropharynx, unspecified: Secondary | ICD-10-CM | POA: Insufficient documentation

## 2019-05-07 DIAGNOSIS — M79606 Pain in leg, unspecified: Secondary | ICD-10-CM | POA: Diagnosis not present

## 2019-05-07 DIAGNOSIS — Z5112 Encounter for antineoplastic immunotherapy: Secondary | ICD-10-CM | POA: Diagnosis present

## 2019-05-07 DIAGNOSIS — M25569 Pain in unspecified knee: Secondary | ICD-10-CM | POA: Insufficient documentation

## 2019-05-07 DIAGNOSIS — Z79899 Other long term (current) drug therapy: Secondary | ICD-10-CM | POA: Insufficient documentation

## 2019-05-07 DIAGNOSIS — C3492 Malignant neoplasm of unspecified part of left bronchus or lung: Secondary | ICD-10-CM

## 2019-05-07 LAB — CBC WITH DIFFERENTIAL/PLATELET
Abs Immature Granulocytes: 0 10*3/uL (ref 0.00–0.07)
Basophils Absolute: 0 10*3/uL (ref 0.0–0.1)
Basophils Relative: 0 %
Eosinophils Absolute: 0.1 10*3/uL (ref 0.0–0.5)
Eosinophils Relative: 3 %
HCT: 41.1 % (ref 39.0–52.0)
Hemoglobin: 13.1 g/dL (ref 13.0–17.0)
Immature Granulocytes: 0 %
Lymphocytes Relative: 28 %
Lymphs Abs: 1.2 10*3/uL (ref 0.7–4.0)
MCH: 30.3 pg (ref 26.0–34.0)
MCHC: 31.9 g/dL (ref 30.0–36.0)
MCV: 95.1 fL (ref 80.0–100.0)
Monocytes Absolute: 0.5 10*3/uL (ref 0.1–1.0)
Monocytes Relative: 11 %
Neutro Abs: 2.5 10*3/uL (ref 1.7–7.7)
Neutrophils Relative %: 58 %
Platelets: 181 10*3/uL (ref 150–400)
RBC: 4.32 MIL/uL (ref 4.22–5.81)
RDW: 14.7 % (ref 11.5–15.5)
WBC: 4.3 10*3/uL (ref 4.0–10.5)
nRBC: 0 % (ref 0.0–0.2)

## 2019-05-07 LAB — COMPREHENSIVE METABOLIC PANEL
ALT: 23 U/L (ref 0–44)
AST: 23 U/L (ref 15–41)
Albumin: 4.2 g/dL (ref 3.5–5.0)
Alkaline Phosphatase: 46 U/L (ref 38–126)
Anion gap: 9 (ref 5–15)
BUN: 29 mg/dL — ABNORMAL HIGH (ref 8–23)
CO2: 27 mmol/L (ref 22–32)
Calcium: 9.4 mg/dL (ref 8.9–10.3)
Chloride: 104 mmol/L (ref 98–111)
Creatinine, Ser: 1.94 mg/dL — ABNORMAL HIGH (ref 0.61–1.24)
GFR calc Af Amer: 40 mL/min — ABNORMAL LOW (ref >60)
GFR calc non Af Amer: 34 mL/min — ABNORMAL LOW (ref >60)
Glucose, Bld: 97 mg/dL (ref 70–99)
Potassium: 4.5 mmol/L (ref 3.5–5.1)
Sodium: 140 mmol/L (ref 135–145)
Total Bilirubin: 0.5 mg/dL (ref 0.3–1.2)
Total Protein: 7.3 g/dL (ref 6.5–8.1)

## 2019-05-07 LAB — TSH: TSH: 1.884 u[IU]/mL (ref 0.350–4.500)

## 2019-05-07 MED ORDER — HYDROCODONE-ACETAMINOPHEN 5-325 MG PO TABS: ORAL_TABLET | ORAL | 0 refills | Status: DC

## 2019-05-07 MED ORDER — HEPARIN SOD (PORK) LOCK FLUSH 100 UNIT/ML IV SOLN
500.0000 [IU] | Freq: Once | INTRAVENOUS | Status: AC | PRN
  Administered 2019-05-07: 16:00:00 500 [IU]

## 2019-05-07 MED ORDER — SODIUM CHLORIDE 0.9 % IV SOLN: Freq: Once | INTRAVENOUS | Status: AC

## 2019-05-07 MED ORDER — SODIUM CHLORIDE 0.9% FLUSH
10.0000 mL | INTRAVENOUS | Status: DC | PRN
  Administered 2019-05-07 (×2): 10 mL

## 2019-05-07 MED ORDER — SODIUM CHLORIDE 0.9 % IV SOLN
200.0000 mg | Freq: Once | INTRAVENOUS | Status: AC
  Administered 2019-05-07: 15:00:00 200 mg via INTRAVENOUS
  Filled 2019-05-07: qty 8

## 2019-05-07 NOTE — Patient Instructions (Signed)
[facility] Discharge Instructions for Patients Receiving Chemotherapy  Today you received the following chemotherapy agents   To help prevent nausea and vomiting after your treatment, we encourage you to take your nausea medication   If you develop nausea and vomiting that is not controlled by your nausea medication, call the clinic.   BELOW ARE SYMPTOMS THAT SHOULD BE REPORTED IMMEDIATELY:  *FEVER GREATER THAN 100.5 F  *CHILLS WITH OR WITHOUT FEVER  NAUSEA AND VOMITING THAT IS NOT CONTROLLED WITH YOUR NAUSEA MEDICATION  *UNUSUAL SHORTNESS OF BREATH  *UNUSUAL BRUISING OR BLEEDING  TENDERNESS IN MOUTH AND THROAT WITH OR WITHOUT PRESENCE OF ULCERS  *URINARY PROBLEMS  *BOWEL PROBLEMS  UNUSUAL RASH Items with * indicate a potential emergency and should be followed up as soon as possible.  Feel free to call the clinic should you have any questions or concerns. The clinic phone number is [phone].  Please show the CHEMO ALERT CARD at check-in to the Emergency Department and triage nurse.   

## 2019-05-07 NOTE — Progress Notes (Signed)
Patient presents today for treatment. Labs pending. BP diastolic elevated on arrival but within parameters for treatment. MAR reviewed and updated. Patient requests refill on his pain medication. RLockamy NP notified of request. Patient has no complaints of any changes since the last visit. Patient complains of leg pain and knee pain which he states is chronic and has not changed in severity.   Labs reviewed by [facility] NP. Proceed with treatment. Creat 1.94. NP aware.   Treatment given today per MD orders. Tolerated infusion without adverse affects. Vital signs stable. No complaints at this time. Discharged from clinic ambulatory. F/U with [facility] as scheduled.

## 2019-05-13 ENCOUNTER — Other Ambulatory Visit (HOSPITAL_COMMUNITY): Payer: Self-pay | Admitting: *Deleted

## 2019-05-13 ENCOUNTER — Encounter: Payer: Self-pay | Admitting: Gastroenterology

## 2019-05-13 DIAGNOSIS — E038 Other specified hypothyroidism: Secondary | ICD-10-CM

## 2019-05-13 MED ORDER — LEVOTHYROXINE SODIUM 75 MCG PO TABS: 75.0000 ug | ORAL_TABLET | Freq: Every day | ORAL | 2 refills | Status: DC

## 2019-05-14 ENCOUNTER — Other Ambulatory Visit (HOSPITAL_COMMUNITY): Payer: Self-pay | Admitting: Hematology

## 2019-05-14 DIAGNOSIS — E038 Other specified hypothyroidism: Secondary | ICD-10-CM

## 2019-05-26 ENCOUNTER — Encounter (HOSPITAL_COMMUNITY): Admission: RE | Admit: 2019-05-26 | Payer: Medicare HMO | Source: Ambulatory Visit

## 2019-05-28 ENCOUNTER — Ambulatory Visit (HOSPITAL_COMMUNITY): Payer: Medicare HMO | Admitting: Hematology

## 2019-05-28 ENCOUNTER — Ambulatory Visit (HOSPITAL_COMMUNITY): Payer: Medicare HMO

## 2019-05-28 ENCOUNTER — Other Ambulatory Visit (HOSPITAL_COMMUNITY): Payer: Medicare HMO

## 2019-06-03 ENCOUNTER — Inpatient Hospital Stay (HOSPITAL_COMMUNITY): Payer: Medicare HMO | Attending: Hematology

## 2019-06-03 ENCOUNTER — Encounter (HOSPITAL_COMMUNITY): Payer: Self-pay

## 2019-06-03 ENCOUNTER — Other Ambulatory Visit (HOSPITAL_COMMUNITY): Payer: Self-pay | Admitting: *Deleted

## 2019-06-03 ENCOUNTER — Other Ambulatory Visit: Payer: Self-pay

## 2019-06-03 ENCOUNTER — Inpatient Hospital Stay (HOSPITAL_COMMUNITY): Payer: Medicare HMO

## 2019-06-03 VITALS — BP 129/74 | HR 55 | Temp 96.9°F | Resp 18

## 2019-06-03 DIAGNOSIS — C01 Malignant neoplasm of base of tongue: Secondary | ICD-10-CM | POA: Insufficient documentation

## 2019-06-03 DIAGNOSIS — C3492 Malignant neoplasm of unspecified part of left bronchus or lung: Secondary | ICD-10-CM

## 2019-06-03 DIAGNOSIS — C3482 Malignant neoplasm of overlapping sites of left bronchus and lung: Secondary | ICD-10-CM | POA: Diagnosis not present

## 2019-06-03 DIAGNOSIS — C109 Malignant neoplasm of oropharynx, unspecified: Secondary | ICD-10-CM

## 2019-06-03 DIAGNOSIS — E039 Hypothyroidism, unspecified: Secondary | ICD-10-CM | POA: Diagnosis not present

## 2019-06-03 DIAGNOSIS — Z5112 Encounter for antineoplastic immunotherapy: Secondary | ICD-10-CM | POA: Insufficient documentation

## 2019-06-03 LAB — COMPREHENSIVE METABOLIC PANEL
ALT: 20 U/L (ref 0–44)
AST: 25 U/L (ref 15–41)
Albumin: 4.1 g/dL (ref 3.5–5.0)
Alkaline Phosphatase: 47 U/L (ref 38–126)
Anion gap: 11 (ref 5–15)
BUN: 16 mg/dL (ref 8–23)
CO2: 26 mmol/L (ref 22–32)
Calcium: 9 mg/dL (ref 8.9–10.3)
Chloride: 102 mmol/L (ref 98–111)
Creatinine, Ser: 1.95 mg/dL — ABNORMAL HIGH (ref 0.61–1.24)
GFR calc Af Amer: 40 mL/min — ABNORMAL LOW (ref >60)
GFR calc non Af Amer: 34 mL/min — ABNORMAL LOW (ref >60)
Glucose, Bld: 134 mg/dL — ABNORMAL HIGH (ref 70–99)
Potassium: 3.9 mmol/L (ref 3.5–5.1)
Sodium: 139 mmol/L (ref 135–145)
Total Bilirubin: 0.6 mg/dL (ref 0.3–1.2)
Total Protein: 7.1 g/dL (ref 6.5–8.1)

## 2019-06-03 LAB — CBC WITH DIFFERENTIAL/PLATELET
Abs Immature Granulocytes: 0.01 10*3/uL (ref 0.00–0.07)
Basophils Absolute: 0 10*3/uL (ref 0.0–0.1)
Basophils Relative: 1 %
Eosinophils Absolute: 0.1 10*3/uL (ref 0.0–0.5)
Eosinophils Relative: 2 %
HCT: 39.9 % (ref 39.0–52.0)
Hemoglobin: 13.2 g/dL (ref 13.0–17.0)
Immature Granulocytes: 0 %
Lymphocytes Relative: 29 %
Lymphs Abs: 1.3 10*3/uL (ref 0.7–4.0)
MCH: 31.4 pg (ref 26.0–34.0)
MCHC: 33.1 g/dL (ref 30.0–36.0)
MCV: 95 fL (ref 80.0–100.0)
Monocytes Absolute: 0.5 10*3/uL (ref 0.1–1.0)
Monocytes Relative: 11 %
Neutro Abs: 2.5 10*3/uL (ref 1.7–7.7)
Neutrophils Relative %: 57 %
Platelets: 189 10*3/uL (ref 150–400)
RBC: 4.2 MIL/uL — ABNORMAL LOW (ref 4.22–5.81)
RDW: 15 % (ref 11.5–15.5)
WBC: 4.4 10*3/uL (ref 4.0–10.5)
nRBC: 0 % (ref 0.0–0.2)

## 2019-06-03 MED ORDER — SODIUM CHLORIDE 0.9% FLUSH
10.0000 mL | INTRAVENOUS | Status: DC | PRN
  Administered 2019-06-03: 15:00:00 10 mL

## 2019-06-03 MED ORDER — HYDROCODONE-ACETAMINOPHEN 5-325 MG PO TABS: ORAL_TABLET | ORAL | 0 refills | Status: DC

## 2019-06-03 MED ORDER — SODIUM CHLORIDE 0.9 % IV SOLN
200.0000 mg | Freq: Once | INTRAVENOUS | Status: AC
  Administered 2019-06-03: 200 mg via INTRAVENOUS
  Filled 2019-06-03: qty 8

## 2019-06-03 MED ORDER — HEPARIN SOD (PORK) LOCK FLUSH 100 UNIT/ML IV SOLN
500.0000 [IU] | Freq: Once | INTRAVENOUS | Status: AC | PRN
  Administered 2019-06-03: 15:00:00 500 [IU]

## 2019-06-03 MED ORDER — SODIUM CHLORIDE 0.9 % IV SOLN: Freq: Once | INTRAVENOUS | Status: AC

## 2019-06-03 NOTE — Patient Instructions (Signed)
[facility] Discharge Instructions for Patients Receiving Chemotherapy  Today you received the following chemotherapy agents   To help prevent nausea and vomiting after your treatment, we encourage you to take your nausea medication   If you develop nausea and vomiting that is not controlled by your nausea medication, call the clinic.   BELOW ARE SYMPTOMS THAT SHOULD BE REPORTED IMMEDIATELY:  *FEVER GREATER THAN 100.5 F  *CHILLS WITH OR WITHOUT FEVER  NAUSEA AND VOMITING THAT IS NOT CONTROLLED WITH YOUR NAUSEA MEDICATION  *UNUSUAL SHORTNESS OF BREATH  *UNUSUAL BRUISING OR BLEEDING  TENDERNESS IN MOUTH AND THROAT WITH OR WITHOUT PRESENCE OF ULCERS  *URINARY PROBLEMS  *BOWEL PROBLEMS  UNUSUAL RASH Items with * indicate a potential emergency and should be followed up as soon as possible.  Feel free to call the clinic should you have any questions or concerns. The clinic phone number is [phone].  Please show the CHEMO ALERT CARD at check-in to the Emergency Department and triage nurse.   

## 2019-06-03 NOTE — Progress Notes (Signed)
Patient presents today for treatment and lab work. Vital signs are within parameters for treatment. MAR reviewed. Labs pending. Patient has no complaints of any pain since his last visit. Patient has no significant changes since his last visit.    Creatinine today 1.95. Reported creatinine to RLockamy NP via instant message.  Labs reviewed by [facility] NP, verbal order received to proceed with treatment.   Treatment given today per MD orders. Tolerated infusion without adverse affects. Vital signs stable. No complaints at this time. Discharged from clinic ambulatory. F/U with [facility] as scheduled.

## 2019-06-05 ENCOUNTER — Encounter: Payer: Self-pay | Admitting: Gastroenterology

## 2019-06-05 ENCOUNTER — Ambulatory Visit (INDEPENDENT_AMBULATORY_CARE_PROVIDER_SITE_OTHER): Payer: Medicare HMO | Admitting: Gastroenterology

## 2019-06-05 ENCOUNTER — Other Ambulatory Visit: Payer: Self-pay

## 2019-06-05 ENCOUNTER — Encounter: Payer: Self-pay | Admitting: *Deleted

## 2019-06-05 VITALS — BP 115/74 | HR 79 | Temp 96.8°F | Ht 73.0 in | Wt 145.2 lb

## 2019-06-05 DIAGNOSIS — R1310 Dysphagia, unspecified: Secondary | ICD-10-CM

## 2019-06-05 DIAGNOSIS — R1319 Other dysphagia: Secondary | ICD-10-CM

## 2019-06-05 NOTE — Patient Instructions (Signed)
DRINK WATER TO KEEP YOUR URINE LIGHT YELLOW.   TO GAIN WEIGHT, DRINK BOOST, ENSURE, OR CARNATION INSTANT BREAKFAST FOUR TIMES A DAY.  FOLLOW A SOFT MECHANICAL DIET.  MEATS SHOULD BE GROUND ONLY. DO NOT EAT CHUNKS OF ANYTHING. SEE INFO BELOW.   COMPLETE BARIUM SWALLOW.  FOLLOW UP IN 1 YEAR.    SOFT MECHANICAL DIET This SOFT MECHANICAL DIET is restricted to:  Foods that are moist, soft-textured, and easy to chew and swallow.   Meats that are ground or are minced no larger than one-quarter inch pieces. Meats are moist with gravy or sauce added.   Foods that do not include bread or bread-like textures except soft pancakes, well-moistened with syrup or sauce.   Textures with some chewing ability required.   Casseroles without rice.   Cooked vegetables that are less than half an inch in size and easily mashed with a fork. No cooked corn, peas, broccoli, cauliflower, cabbage, Brussels sprouts, asparagus, or other fibrous, non-tender or rubbery cooked vegetables.   Canned fruit except for pineapple. Fruit must be cut into pieces no larger than half an inch in size.   Foods that do not include nuts, seeds, coconut, or sticky textures.   FOOD TEXTURES FOR DYSPHAGIA DIET LEVEL 2 -SOFT MECHANICAL DIET (includes all foods on Dysphagia Diet Level 1 - Pureed, in addition to the foods listed below)  FOOD GROUP: Breads. RECOMMENDED: Soft pancakes, well-moistened with syrup or sauce.  AVOID: All others.  FOOD GROUP: Cereals.  RECOMMENDED: Cooked cereals with little texture, including oatmeal. Unprocessed wheat bran stirred into cereals for bulk. Note: If thin liquids are restricted, it is important that all of the liquid is absorbed into the cereal.  AVOID: All dry cereals and any cooked cereals that may contain flax seeds or other seeds or nuts. Whole-grain, dry, or coarse cereals. Cereals with nuts, seeds, dried fruit, and/or coconut.  FOOD GROUP: Desserts. RECOMMENDED: Pudding,  custard. Soft fruit pies with bottom crust only. Canned fruit (excluding pineapple). Soft, moist cakes with icing.Frozen malts, milk shakes, frozen yogurt, eggnog, nutritional supplements, ice cream, sherbet, regular or sugar-free gelatin, or any foods that become thin liquid at either room (70 F) or body temperature (98 F).  AVOID: Dry, coarse cakes and cookies. Anything with nuts, seeds, coconut, pineapple, or dried fruit. Breakfast yogurt with nuts. Rice or bread pudding.  FOOD GROUP: Fats. RECOMMENDED: Butter, margarine, cream for cereal (depending on liquid consistency recommendations), gravy, cream sauces, sour cream, sour cream dips with soft additives, mayonnaise, salad dressings, cream cheese, cream cheese spreads with soft additives, whipped toppings.  AVOID: All fats with coarse or chunky additives.  FOOD GROUP: Fruits. RECOMMENDED: Soft drained, canned, or cooked fruits without seeds or skin. Fresh soft and ripe banana. Fruit juices with a small amount of pulp. If thin liquids are restricted, fruit juices should be thickened to appropriate consistency.  AVOID: Fresh or frozen fruits. Cooked fruit with skin or seeds. Dried fruits. Fresh, canned, or cooked pineapple.  FOOD GROUP: Meats and Meat Substitutes. (Meat pieces should not exceed 1/4 of an inch cube and should be tender.) RECOMMENDED: Moistened ground or cooked meat, poultry, or fish. Moist ground or tender meat may be served with gravy or sauce. Casseroles without rice. Moist macaroni and cheese, well-cooked pasta with meat sauce, tuna noodle casserole, soft, moist lasagna. Moist meatballs, meatloaf, or fish loaf. Protein salads, such as tuna or egg without large chunks, celery, or onion. Cottage cheese, smooth quiche without large chunks. Poached, scrambled,  or soft-cooked eggs (egg yolks should not be "runny" but should be moist and able to be mashed with butter, margarine, or other moisture added to them). (Cook eggs to 160  F or use pasteurized eggs for safety.) Souffls may have small, soft chunks. Tofu. Well-cooked, slightly mashed, moist legumes, such as baked beans. All meats or protein substitutes should be served with sauces or moistened to help maintain cohesiveness in the oral cavity.  AVOID: Dry meats, tough meats (such as bacon, sausage, hot dogs, bratwurst). Dry casseroles or casseroles with rice or large chunks. Peanut butter. Cheese slices and cubes. Hard-cooked or crisp fried eggs. Sandwiches.Pizza.  FOOD GROUP: Potatoes and Starches. RECOMMENDED: Well-cooked, moistened, boiled, baked, or mashed potatoes. Well-cooked shredded hash brown potatoes that are not crisp. (All potatoes need to be moist and in sauces.)Well-cooked noodles in sauce. Spaetzel or soft dumplings that have been moistened with butter or gravy.  AVOID: Potato skins and chips. Fried or French-fried potatoes. Rice.  FOOD GROUP: Soups. RECOMMENDED: Soups with easy-to-chew or easy-to-swallow meats or vegetables: Particle sizes in soups should be less than 1/2 inch. Soups will need to be thickened to appropriate consistency if soup is thinner than prescribed liquid consistency.  AVOID: Soups with large chunks of meat and vegetables. Soups with rice, corn, peas.  FOOD GROUP: Vegetables. RECOMMENDED: All soft, well-cooked vegetables. Vegetables should be less than a half inch. Should be easily mashed with a fork.  AVOID: Cooked corn and peas. Broccoli, cabbage, Brussels sprouts, asparagus, or other fibrous, non-tender or rubbery cooked vegetables.  FOOD GROUP: Miscellaneous. RECOMMENDED: Jams and preserves without seeds, jelly. Sauces, salsas, etc., that may have small tender chunks less than 1/2 inch. Soft, smooth chocolate bars that are easily chewed.  AVOID: Seeds, nuts, coconut, or sticky foods. Chewy candies such as caramels or licorice.

## 2019-06-05 NOTE — Progress Notes (Signed)
Subjective:    Patient ID: Alexander [name], male    DOB: 12/23/1950, 69 y.o.   MRN: [id number]   Lemmie Evens, MD  HPI WANTS TO BE 160 OR 165 LBS.  WEIGHT UP FROM 135 LBS IN AUG 2019. DRINKING 2 BOOST OR ENSURE A DAY. BMs: GOOD. SWALLOWING  OK BUT NOT AS GOOD AS WHEN HE FIRST STARTED GETTING THE TREATMENT. PET SCAN SCHEDULED THIS EVENING. PROBLEMS SWALLOWING PORK CHOP OR STEAK. MAY EAT BIGGER BITES OR NOT CHEW AS WELL. SOFT FOODS NO PROBLEM.  PT DENIES FEVER, CHILLS, HEMATOCHEZIA, HEMATEMESIS, nausea, vomiting, melena, diarrhea, CHEST PAIN, SHORTNESS OF BREATH, CHANGE IN BOWEL IN HABITS, constipation, abdominal pain, OR heartburn or indigestion.  Past Medical History:  Diagnosis Date  . GERD (gastroesophageal reflux disease)   . 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 3 weeks ago.- Dr. [facility] [place], [place].  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 BIOPSY  01/15/2018   Procedure: BIOPSY;  Surgeon: Danie Binder, MD;  [facility]: [facility];  Service: Endoscopy;;  gastric  . COLONOSCOPY N/A 03/13/2016   Procedure: COLONOSCOPY;  Surgeon: Danie Binder, MD;  [facility]: [facility];  Service: Endoscopy;  Laterality: N/A;  2:15 PM  . ESOPHAGOGASTRODUODENOSCOPY (EGD) WITH PROPOFOL N/A 08/17/2014   Procedure: ESOPHAGOGASTRODUODENOSCOPY (EGD) WITH PROPOFOL (procedure #1);  Surgeon: Aviva Signs Md, MD;  Location: [facility];  Service: General;  Laterality: N/A;  . ESOPHAGOGASTRODUODENOSCOPY (EGD) WITH PROPOFOL N/A 01/15/2018   Procedure: ESOPHAGOGASTRODUODENOSCOPY (EGD) WITH PROPOFOL;  Surgeon: Danie Binder, MD;  [facility]: [facility];  Service: Endoscopy;  Laterality: N/A;  9:30am  . MULTIPLE EXTRACTIONS WITH ALVEOLOPLASTY N/A 08/12/2014   Procedure: Extraction of tooth #'s  6,17,22,23,24,25,26,27 with alveoloplasty;  Surgeon: Lenn Cal, DDS;  Location: [facility];  Service: Oral Surgery;  Laterality: N/A;  . PANENDOSCOPY N/A 08/06/2014   Procedure: PANENDOSCOPY WITH BIOPSY;  Surgeon: Leta Baptist, MD;  Location: [facility];  Service: ENT;  Laterality: N/A;  . PEG PLACEMENT Left 08/17/14  . PEG PLACEMENT N/A 08/17/2014   Procedure: PERCUTANEOUS ENDOSCOPIC GASTROSTOMY (PEG) PLACEMENT (procedure #1);  Surgeon: Aviva Signs Md, MD;  Location: [facility];  Service: General;  Laterality: N/A;  . PORT-A-CATH REMOVAL Right 07/17/2016   Procedure: MINOR REMOVAL PORT-A-CATH;  Surgeon: Aviva Signs, MD;  Location: [facility];  Service: General;  Laterality: Right;  . PORTACATH PLACEMENT Right 08/17/14  . PORTACATH PLACEMENT Right 08/17/2014   Procedure: INSERTION PORT-A-CATH (procedure #2);  Surgeon: Aviva Signs Md, MD;  Location: [facility];  Service: General;  Laterality: Right;  . PORTACATH PLACEMENT Left 04/26/2018   Procedure: INSERTION PORT-A-CATH (attached catheter in left subclavian);  Surgeon: Aviva Signs, MD;  Location: [facility];  Service: General;  Laterality: Left;  . SAVORY DILATION N/A 01/15/2018   Procedure: SAVORY DILATION;  Surgeon: Danie Binder, MD;  [facility];  Service: Endoscopy;  Laterality: N/A;  . VIDEO BRONCHOSCOPY WITH ENDOBRONCHIAL ULTRASOUND N/A 04/15/2018   Procedure: VIDEO BRONCHOSCOPY WITH ENDOBRONCHIAL ULTRASOUND;  Surgeon: Melrose Nakayama, MD;  Location: [facility];  Service: Thoracic;  Laterality: N/A;   No Known Allergies     Review of Systems PER HPI OTHERWISE ALL SYSTEMS ARE NEGATIVE.    Objective:   Physical Exam Constitutional:  General: He is not in acute distress.    Appearance: Normal appearance.  HENT:     Mouth/Throat:     Comments: MASK IN PLACE Eyes:     General: No scleral icterus.    Pupils: Pupils are equal, round, and reactive to light.  Cardiovascular:     Rate and Rhythm: Normal rate and  regular rhythm.     Pulses: Normal pulses.     Heart sounds: Normal heart sounds.  Pulmonary:     Effort: Pulmonary effort is normal.     Breath sounds: Normal breath sounds.  Abdominal:     General: Bowel sounds are normal.     Palpations: Abdomen is soft.     Tenderness: There is no abdominal tenderness.  Musculoskeletal:     Cervical back: Normal range of motion.     Right lower leg: No edema.     Left lower leg: No edema.  Lymphadenopathy:     Cervical: No cervical adenopathy.  Skin:    General: Skin is warm and dry.  Neurological:     Mental Status: He is alert and oriented to person, place, and time.     Comments: NO  NEW FOCAL DEFICITS  Psychiatric:        Mood and Affect: Mood normal.     Comments: NORMAL AFFECT       Assessment & Plan:

## 2019-06-05 NOTE — Assessment & Plan Note (Signed)
IN SETTING OF STAGE IV HEAD/NECK CANCER. BETTER WITH SOFT FOODS AND AFTER EGD/DIL IN 2019.  AWAIT PET SCAN. DRINK WATER TO KEEP YOUR URINE LIGHT YELLOW. TO GAIN WEIGHT, DRINK BOOST, ENSURE, OR CARNATION INSTANT BREAKFAST FOUR TIMES A DAY. FOLLOW A SOFT MECHANICAL DIET.  MEATS SHOULD BE GROUND ONLY. DO NOT EAT CHUNKS OF ANYTHING. SEE INFO BELOW. COMPLETE BARIUM SWALLOW. CONSIDER EGD/DIL. FOLLOW UP IN 1 YEAR.

## 2019-06-09 ENCOUNTER — Other Ambulatory Visit: Payer: Self-pay

## 2019-06-09 ENCOUNTER — Encounter (HOSPITAL_COMMUNITY)
Admission: RE | Admit: 2019-06-09 | Discharge: 2019-06-09 | Disposition: A | Discharge status: Home / Self Care | Payer: Medicare HMO | Source: Ambulatory Visit | Attending: Hematology | Admitting: Hematology

## 2019-06-09 DIAGNOSIS — C109 Malignant neoplasm of oropharynx, unspecified: Secondary | ICD-10-CM | POA: Diagnosis not present

## 2019-06-09 DIAGNOSIS — C771 Secondary and unspecified malignant neoplasm of intrathoracic lymph nodes: Secondary | ICD-10-CM | POA: Diagnosis not present

## 2019-06-09 MED ORDER — FLUDEOXYGLUCOSE F - 18 (FDG) INJECTION
13.2100 | Freq: Once | INTRAVENOUS | Status: AC | PRN
  Administered 2019-06-09: 13.21 via INTRAVENOUS

## 2019-06-10 ENCOUNTER — Ambulatory Visit (HOSPITAL_COMMUNITY): Admission: RE | Admit: 2019-06-10 | Payer: Medicare HMO | Source: Ambulatory Visit

## 2019-06-11 ENCOUNTER — Other Ambulatory Visit: Payer: Self-pay | Admitting: Nurse Practitioner

## 2019-06-17 ENCOUNTER — Other Ambulatory Visit: Payer: Self-pay | Admitting: Gastroenterology

## 2019-06-17 ENCOUNTER — Encounter (HOSPITAL_COMMUNITY): Payer: Self-pay

## 2019-06-17 ENCOUNTER — Ambulatory Visit (HOSPITAL_COMMUNITY)
Admission: RE | Admit: 2019-06-17 | Discharge: 2019-06-17 | Disposition: A | Discharge status: Home / Self Care | Payer: Medicare HMO | Source: Ambulatory Visit | Attending: Gastroenterology | Admitting: Gastroenterology

## 2019-06-17 ENCOUNTER — Other Ambulatory Visit: Payer: Self-pay

## 2019-06-17 DIAGNOSIS — R1310 Dysphagia, unspecified: Secondary | ICD-10-CM | POA: Insufficient documentation

## 2019-06-17 DIAGNOSIS — T17300A Unspecified foreign body in larynx causing asphyxiation, initial encounter: Secondary | ICD-10-CM | POA: Diagnosis not present

## 2019-06-19 NOTE — Progress Notes (Signed)

## 2019-06-23 ENCOUNTER — Encounter: Payer: Self-pay | Admitting: Emergency Medicine

## 2019-06-23 ENCOUNTER — Other Ambulatory Visit (HOSPITAL_COMMUNITY): Payer: Self-pay

## 2019-06-23 DIAGNOSIS — C3492 Malignant neoplasm of unspecified part of left bronchus or lung: Secondary | ICD-10-CM

## 2019-06-23 DIAGNOSIS — C109 Malignant neoplasm of oropharynx, unspecified: Secondary | ICD-10-CM

## 2019-06-24 ENCOUNTER — Other Ambulatory Visit: Payer: Self-pay

## 2019-06-24 ENCOUNTER — Inpatient Hospital Stay (HOSPITAL_BASED_OUTPATIENT_CLINIC_OR_DEPARTMENT_OTHER): Payer: Medicare HMO | Admitting: Hematology

## 2019-06-24 ENCOUNTER — Inpatient Hospital Stay (HOSPITAL_COMMUNITY): Payer: Medicare HMO

## 2019-06-24 ENCOUNTER — Other Ambulatory Visit (HOSPITAL_COMMUNITY): Payer: Self-pay | Admitting: *Deleted

## 2019-06-24 VITALS — BP 108/63 | HR 52 | Temp 96.9°F | Resp 18 | Wt 143.8 lb

## 2019-06-24 VITALS — BP 119/64 | HR 52

## 2019-06-24 DIAGNOSIS — E039 Hypothyroidism, unspecified: Secondary | ICD-10-CM | POA: Diagnosis not present

## 2019-06-24 DIAGNOSIS — Z5112 Encounter for antineoplastic immunotherapy: Secondary | ICD-10-CM | POA: Diagnosis not present

## 2019-06-24 DIAGNOSIS — C109 Malignant neoplasm of oropharynx, unspecified: Secondary | ICD-10-CM

## 2019-06-24 DIAGNOSIS — E038 Other specified hypothyroidism: Secondary | ICD-10-CM

## 2019-06-24 DIAGNOSIS — C3482 Malignant neoplasm of overlapping sites of left bronchus and lung: Secondary | ICD-10-CM | POA: Diagnosis not present

## 2019-06-24 DIAGNOSIS — C01 Malignant neoplasm of base of tongue: Secondary | ICD-10-CM | POA: Diagnosis not present

## 2019-06-24 DIAGNOSIS — C3492 Malignant neoplasm of unspecified part of left bronchus or lung: Secondary | ICD-10-CM | POA: Diagnosis not present

## 2019-06-24 LAB — CBC WITH DIFFERENTIAL/PLATELET
Abs Immature Granulocytes: 0.01 10*3/uL (ref 0.00–0.07)
Basophils Absolute: 0 10*3/uL (ref 0.0–0.1)
Basophils Relative: 0 %
Eosinophils Absolute: 0.1 10*3/uL (ref 0.0–0.5)
Eosinophils Relative: 3 %
HCT: 38.7 % — ABNORMAL LOW (ref 39.0–52.0)
Hemoglobin: 12.4 g/dL — ABNORMAL LOW (ref 13.0–17.0)
Immature Granulocytes: 0 %
Lymphocytes Relative: 34 %
Lymphs Abs: 1.2 10*3/uL (ref 0.7–4.0)
MCH: 30.2 pg (ref 26.0–34.0)
MCHC: 32 g/dL (ref 30.0–36.0)
MCV: 94.4 fL (ref 80.0–100.0)
Monocytes Absolute: 0.3 10*3/uL (ref 0.1–1.0)
Monocytes Relative: 9 %
Neutro Abs: 1.9 10*3/uL (ref 1.7–7.7)
Neutrophils Relative %: 54 %
Platelets: 189 10*3/uL (ref 150–400)
RBC: 4.1 MIL/uL — ABNORMAL LOW (ref 4.22–5.81)
RDW: 14.7 % (ref 11.5–15.5)
WBC: 3.5 10*3/uL — ABNORMAL LOW (ref 4.0–10.5)
nRBC: 0 % (ref 0.0–0.2)

## 2019-06-24 LAB — COMPREHENSIVE METABOLIC PANEL
ALT: 24 U/L (ref 0–44)
AST: 23 U/L (ref 15–41)
Albumin: 4 g/dL (ref 3.5–5.0)
Alkaline Phosphatase: 44 U/L (ref 38–126)
Anion gap: 11 (ref 5–15)
BUN: 27 mg/dL — ABNORMAL HIGH (ref 8–23)
CO2: 25 mmol/L (ref 22–32)
Calcium: 9.2 mg/dL (ref 8.9–10.3)
Chloride: 103 mmol/L (ref 98–111)
Creatinine, Ser: 2.01 mg/dL — ABNORMAL HIGH (ref 0.61–1.24)
GFR calc Af Amer: 38 mL/min — ABNORMAL LOW (ref >60)
GFR calc non Af Amer: 33 mL/min — ABNORMAL LOW (ref >60)
Glucose, Bld: 156 mg/dL — ABNORMAL HIGH (ref 70–99)
Potassium: 3.8 mmol/L (ref 3.5–5.1)
Sodium: 139 mmol/L (ref 135–145)
Total Bilirubin: 0.6 mg/dL (ref 0.3–1.2)
Total Protein: 6.8 g/dL (ref 6.5–8.1)

## 2019-06-24 LAB — TSH: TSH: 7.62 u[IU]/mL — ABNORMAL HIGH (ref 0.350–4.500)

## 2019-06-24 MED ORDER — SODIUM CHLORIDE 0.9 % IV SOLN
200.0000 mg | Freq: Once | INTRAVENOUS | Status: AC
  Administered 2019-06-24: 200 mg via INTRAVENOUS
  Filled 2019-06-24: qty 8

## 2019-06-24 MED ORDER — HEPARIN SOD (PORK) LOCK FLUSH 100 UNIT/ML IV SOLN
500.0000 [IU] | Freq: Once | INTRAVENOUS | Status: AC | PRN
  Administered 2019-06-24: 500 [IU]

## 2019-06-24 MED ORDER — HYDROCODONE-ACETAMINOPHEN 5-325 MG PO TABS: ORAL_TABLET | ORAL | 0 refills | Status: DC

## 2019-06-24 MED ORDER — SODIUM CHLORIDE 0.9 % IV SOLN: Freq: Once | INTRAVENOUS | Status: AC

## 2019-06-24 MED ORDER — SODIUM CHLORIDE 0.9% FLUSH
10.0000 mL | INTRAVENOUS | Status: DC | PRN
  Administered 2019-06-24: 10 mL

## 2019-06-24 NOTE — Addendum Note (Signed)
Addended by: Charlyne Petrin B on: 06/24/2019 11:35 AM   Modules accepted: Orders

## 2019-06-24 NOTE — Progress Notes (Signed)
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   07/28/2014 Initial Diagnosis   Oropharyngeal cancer   08/03/2014 Imaging   CT CAP- L supraclavicular lymphadenopathy is not completely visualized. This is better seen on the previous neck CT from 07/27/2014. Otherwise, no evidence for metastatic disease in the chest, abdomen, or pelvis.   08/03/2014 Imaging   Bone scan- Uptake at adjacent anterior LEFT 6, 7, 8 ribs likely representing trauma/fractures. Questionable nonspecific increased tracer localization at the posterior RIGHT 8th and 9th ribs, the adjacent nature which raises a a question of trauma as well   08/06/2014 Pathology Results   Dr. Benjamine Mola- Oropharynx, biopsy, Left - INVASIVE SQUAMOUS CELL CARCINOMA.   08/12/2014 Procedure   Dr. Enrique Sack- 1. Multiple extraction of tooth numbers 6, 17, 22, 23, 24, 25, 26, and 27. 3 Quadrants of alveoloplasty   08/17/2014 Pathology Results   PORT and G-TUBE placed by Dr. Carlis Stable.   08/26/2014 PET scan   Large hypermetabolic mass in the left base of tongue. Activity extends across midline to the right base tongue. 2. Intensely hypermetabolic left cervical metastatic lymph nodes. Lymph nodes extend from the left level II position to the left supraclavi   09/01/2014 - 09/22/2014 Chemotherapy   Concurrent chemoradiation with Cisplatin 100 mg/m2 x 2 cycles with Neulasta support. Held cycle #3 d/t renal toxicity.    09/03/2014 - 10/23/2014 Radiation Therapy   Treated in [facility], IMRT Isidore Moos).  Base of tongue and bilat neck. Total dose: 70 Gy in 35  fractions. (of note, he did miss several treatments requiring BID dosing towards the end of treatment).    01/25/2015 PET scan   Near complete resolution of metabolic activity at the base of tongue. Minimal residual activity is likely post treatment effect. 2. Complete resolution of metabolic activity above LEFT cervical lymph nodes. No evidence of residual metabolically active    1/42/3953 Procedure   Port-a-cath removed [name])    05/03/2018 - 09/10/2018 Chemotherapy   The patient had palonosetron (ALOXI) injection 0.25 mg, 0.25 mg, Intravenous,  Once, 6 of 6 cycles Administration: 0.25 mg (05/03/2018), 0.25 mg (05/24/2018), 0.25 mg (06/14/2018), 0.25 mg (07/09/2018), 0.25 mg (07/30/2018), 0.25 mg (08/21/2018) pegfilgrastim-cbqv (UDENYCA) injection 6 mg, 6 mg, Subcutaneous, Once, 5 of 5 cycles Administration: 6 mg (05/27/2018), 6 mg (06/17/2018), 6 mg (07/11/2018), 6 mg (08/01/2018), 6 mg (08/23/2018) CARBOplatin (PARAPLATIN) 380 mg in sodium chloride 0.9 % 250 mL chemo infusion, 380 mg (100 % of original dose 381 mg), Intravenous,  Once, 6 of 6 cycles Dose modification:   (original dose 381 mg, Cycle 1),   (original dose 309.5 mg, Cycle 2), 307.5 mg (original dose 309.5 mg, Cycle 5) Administration: 380 mg (05/03/2018), 310 mg (05/24/2018), 310 mg (06/14/2018), 340 mg (07/09/2018), 310 mg (07/30/2018), 350 mg (08/21/2018) PACLitaxel (TAXOL) 330 mg in sodium chloride 0.9 % 500 mL chemo infusion (> 84m/m2), 175 mg/m2 = 330 mg (100 % of original dose 175 mg/m2), Intravenous,  Once, 6 of 6 cycles Dose modification: 175 mg/m2 (original dose 175 mg/m2, Cycle 1, Reason: Patient Age) Administration: 330 mg (05/03/2018), 330 mg (05/24/2018), 330  mg (06/14/2018), 330 mg (07/09/2018), 330 mg (07/30/2018), 330 mg (08/21/2018)  for chemotherapy treatment.    05/24/2018 -  Chemotherapy   The patient had pembrolizumab (KEYTRUDA) 200 mg in sodium chloride 0.9 % 50 mL chemo infusion, 200 mg, Intravenous, Once, 19 of 20  cycles Administration: 200 mg (05/24/2018), 200 mg (06/14/2018), 200 mg (07/09/2018), 200 mg (08/21/2018), 200 mg (09/11/2018), 200 mg (10/02/2018), 200 mg (10/23/2018), 200 mg (11/13/2018), 200 mg (12/04/2018), 200 mg (12/25/2018), 200 mg (01/22/2019), 200 mg (02/12/2019), 200 mg (03/05/2019), 200 mg (03/26/2019), 200 mg (04/16/2019), 200 mg (05/07/2019), 200 mg (06/03/2019), 200 mg (06/24/2019)  for chemotherapy treatment.    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 Villarin 69 y.o. male seen for follow-up, toxicity assessment prior to immunotherapy for his metastatic lung cancer.  He had PET CT scan done.  Appetite is reported as 50%.  Energy levels are 75%.  Bilateral knee pains are rated as 9 out of 10.  Denies any diarrhea, skin rashes, dry cough or shortness of breath.  No recent ER visits or hospitalizations reported.  REVIEW OF SYSTEMS:  Review of Systems  Musculoskeletal: Positive for arthralgias.  All other systems reviewed and are negative.    PAST MEDICAL/SURGICAL HISTORY:  Past Medical History:  Diagnosis Date  . GERD (gastroesophageal reflux disease)   . 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 Oropharyngeal cancer ([facility]) 07/28/2014   dx. 3 weeks ago.- Dr. [facility] [place], [place].  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 BIOPSY  01/15/2018   Procedure: BIOPSY;  Surgeon: Danie Binder, MD;  [facility];  Service: Endoscopy;;  gastric  . COLONOSCOPY N/A 03/13/2016   Procedure: COLONOSCOPY;  Surgeon: Danie Binder, MD;  [facility];  Service: Endoscopy;   Laterality: N/A;  2:15 PM  . ESOPHAGOGASTRODUODENOSCOPY (EGD) WITH PROPOFOL N/A 08/17/2014   Procedure: ESOPHAGOGASTRODUODENOSCOPY (EGD) WITH PROPOFOL (procedure #1);  Surgeon: Aviva Signs Md, MD;  Location: [facility];  Service: General;  Laterality: N/A;  . ESOPHAGOGASTRODUODENOSCOPY (EGD) WITH PROPOFOL N/A 01/15/2018   Procedure: ESOPHAGOGASTRODUODENOSCOPY (EGD) WITH PROPOFOL;  Surgeon: Danie Binder, MD;  [facility];  Service: Endoscopy;  Laterality: N/A;  9:30am  . MULTIPLE EXTRACTIONS WITH ALVEOLOPLASTY N/A 08/12/2014   Procedure: Extraction of tooth #'s 6,17,22,23,24,25,26,27 with alveoloplasty;  Surgeon: Lenn Cal, DDS;  Location: [facility];  Service: Oral Surgery;  Laterality: N/A;  . PANENDOSCOPY N/A 08/06/2014   Procedure: PANENDOSCOPY WITH BIOPSY;  Surgeon: Leta Baptist, MD;  Location: [facility];  Service: ENT;  Laterality: N/A;  . PEG PLACEMENT Left 08/17/14  . PEG PLACEMENT N/A 08/17/2014   Procedure: PERCUTANEOUS ENDOSCOPIC GASTROSTOMY (PEG) PLACEMENT (procedure #1);  Surgeon: Aviva Signs Md, MD;  Location: [facility];  Service: General;  Laterality: N/A;  . PORT-A-CATH REMOVAL Right 07/17/2016   Procedure: MINOR REMOVAL PORT-A-CATH;  Surgeon: Aviva Signs, MD;  Location[facility];  Service: General;  Laterality: Right;  . PORTACATH PLACEMENT Right 08/17/14  . PORTACATH PLACEMENT Right 08/17/2014   Procedure: INSERTION PORT-A-CATH (procedure #2);  Surgeon: Aviva Signs Md, MD;  Location[facility];  Service: General;  Laterality: Right;  . PORTACATH PLACEMENT Left 04/26/2018   Procedure: INSERTION PORT-A-CATH (attached catheter in left subclavian);  Surgeon: Aviva Signs, MD;  Location[facility];  Service: General;  Laterality: Left;  . SAVORY DILATION N/A 01/15/2018   Procedure: SAVORY DILATION;  Surgeon: Danie Binder, MD;  [facility];  Service: Endoscopy;  Laterality: N/A;  . VIDEO BRONCHOSCOPY WITH ENDOBRONCHIAL ULTRASOUND N/A 04/15/2018   Procedure:  VIDEO BRONCHOSCOPY WITH ENDOBRONCHIAL ULTRASOUND;  Surgeon: Melrose Nakayama, MD;  Location: [facility];  Service: Thoracic;  Laterality: N/A;     SOCIAL HISTORY:  Social History   Socioeconomic History  . Marital status: Legally Separated    Spouse name: Not on file  . Number of children: 5  . Years of education: Not on file  . Highest education level: Not on file  Occupational History  . Not on file  Tobacco Use  . Smoking status: Former Smoker    Packs/day: 0.50    Years: 30.00    Pack years: 15.00    Quit date: 07/22/2014    Years since quitting: 4.9  . Smokeless tobacco: Never Used  Substance and Sexual Activity  . Alcohol use: Not Currently    Alcohol/week: 0.0 standard drinks    Comment: None currently (11/09/17); previously 1-2 beers on the weekend  . Drug use: No  . Sexual activity: Not on file  Other Topics Concern  . Not on file  Social History Narrative  . Not on file   Social Determinants of Health   Financial Resource Strain:   . Difficulty of Paying Living Expenses:   Food Insecurity:   . Worried About Charity fundraiser in the Last Year:   . Arboriculturist in the Last Year:   Transportation Needs:   . Film/video editor (Medical):   Marland Kitchen Lack of Transportation (Non-Medical):   Physical Activity:   . Days of Exercise per Week:   . Minutes of Exercise per Session:   Stress:   . Feeling of Stress :   Social Connections:   . Frequency of Communication with Friends and Family:   . Frequency of Social Gatherings with Friends and Family:   . Attends Religious Services:   . Active Member of Clubs or Organizations:   . Attends Archivist Meetings:   Marland Kitchen Marital Status:   Intimate Partner Violence:   . Fear of Current or Ex-Partner:   . Emotionally Abused:   Marland Kitchen Physically Abused:   . Sexually Abused:     FAMILY HISTORY:  Family History  Problem Relation Age of Onset  . Colon cancer Neg Hx   . Gastric cancer Neg Hx   . Esophageal cancer  Neg Hx     CURRENT MEDICATIONS:  Outpatient Encounter Medications as of 06/24/2019  Medication Sig  . feeding supplement, ENSURE ENLIVE, (ENSURE ENLIVE) LIQD Take 237 mLs by mouth 2 (two) times daily.  Marland Kitchen levothyroxine (SYNTHROID) 75 MCG tablet TAKE 1 TABLET DAILY BEFORE BREAKFAST.  . Melatonin 10 MG TABS Take 1 tablet by mouth at bedtime.  Marland Kitchen omeprazole (PRILOSEC) 20 MG capsule TAKE 1 CAPSULE BY MOUTH 30 MINUTES PRIOR TO BREAKFAST  . Pembrolizumab (KEYTRUDA IV) Inject into the vein every 21 ( twenty-one) days.  . [DISCONTINUED] HYDROcodone-acetaminophen (NORCO/VICODIN) 5-325 MG tablet TAKE 1 TABLET EVERY 12 HOURS AS NEEDED FOR MODERATE PAIN (Patient not taking: Reported on 06/24/2019)  . [  DISCONTINUED] prochlorperazine (COMPAZINE) 10 MG tablet Take 1 tablet (10 mg total) by mouth every 6 (six) hours as needed (Nausea or vomiting). (Patient not taking: Reported on 11/13/2018)   No facility-administered encounter medications on file as of 06/24/2019.    ALLERGIES:  No Known Allergies   PHYSICAL EXAM:  ECOG Performance status: 1  Vitals:   06/24/19 0932  BP: 108/63  Pulse: (!) 52  Resp: 18  Temp: (!) 96.9 F (36.1 C)  SpO2: 99%   Filed Weights   06/24/19 0932  Weight: 143 lb 12.8 oz (65.2 kg)    Physical Exam Vitals reviewed.  Constitutional:      Appearance: Normal appearance.  Cardiovascular:     Rate and Rhythm: Normal rate and regular rhythm.     Heart sounds: Normal heart sounds.  Pulmonary:     Effort: Pulmonary effort is normal.     Breath sounds: Normal breath sounds.  Abdominal:     General: There is no distension.     Palpations: Abdomen is soft. There is no mass.  Musculoskeletal:        General: No swelling.  Skin:    General: Skin is warm.  Neurological:     General: No focal deficit present.     Mental Status: He is alert and oriented to person, place, and time.  Psychiatric:        Mood and Affect: Mood normal.        Behavior: Behavior normal.       LABORATORY DATA:  I have reviewed the labs as listed.  CBC    Component Value Date/Time   WBC 3.5 (L) 06/24/2019 0943   RBC 4.10 (L) 06/24/2019 0943   HGB 12.4 (L) 06/24/2019 0943   HCT 38.7 (L) 06/24/2019 0943   PLT 189 06/24/2019 0943   MCV 94.4 06/24/2019 0943   MCH 30.2 06/24/2019 0943   MCHC 32.0 06/24/2019 0943   RDW 14.7 06/24/2019 0943   LYMPHSABS 1.2 06/24/2019 0943   MONOABS 0.3 06/24/2019 0943   EOSABS 0.1 06/24/2019 0943   BASOSABS 0.0 06/24/2019 0943   CMP Latest Ref Rng & Units 06/24/2019 06/03/2019 05/07/2019  Glucose 70 - 99 mg/dL 156(H) 134(H) 97  BUN 8 - 23 mg/dL 27(H) 16 29(H)  Creatinine 0.61 - 1.24 mg/dL 2.01(H) 1.95(H) 1.94(H)  Sodium 135 - 145 mmol/L 139 139 140  Potassium 3.5 - 5.1 mmol/L 3.8 3.9 4.5  Chloride 98 - 111 mmol/L 103 102 104  CO2 22 - 32 mmol/L _0 Calcium 8.9 - 10.3 mg/dL 9.2 9.0 9.4  Total Protein 6.5 - 8.1 g/dL 6.8 7.1 7.3  Total Bilirubin 0.3 - 1.2 mg/dL 0.6 0.6 0.5  Alkaline Phos 38 - 126 U/L 44 47 46  AST 15 - 41 U/L _1 ALT 0 - 44 U/L _2 DIAGNOSTIC IMAGING:  I have independently reviewed the scans.   I have reviewed Venita Lick LPN's note and agree with the documentation.  I personally performed a face-to-face visit, made revisions and my assessment and plan is as follows.    ASSESSMENT & PLAN:   Squamous cell lung cancer, left ([facility]) 1.  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  No other targetable mutations. -6 cycles of carboplatin, paclitaxel and pembrolizumab from 05/03/2018 through 08/21/2018. -Maintenance pembrolizumab from 09/11/2018. -PET scan on 06/09/2019 reviewed by me shows slight increase in hypermetabolic them  in the floor of the mouth and lymph nodes.  One of the lymph nodes has grown by few millimeters. -He would not consider results to progression.  I have reviewed his labs.  White count is 3.5 and platelet count is 189.  LFTs  are normal. -She does not have any immunotherapy related side effects.  Hence have recommended continuation of treatments.  I will see him back in 6 weeks for follow-up.  2.  Hypothyroidism: -His Synthroid was increased to 75 mcg in the last 2 to 3 weeks. -Today TSH is 7.620.  Hence I would not make any changes.  I will follow it in 6 weeks.  3.  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 -PET scan was not suggestive of any recurrence.  4.  Bilateral knee pains: -He will continue hydrocodone 5/325 1 to 2 tablets daily.  I have given refill.     Orders placed this encounter:  Orders Placed This Encounter  Procedures  . TSH      Derek Jack, [place] [phone]

## 2019-06-24 NOTE — Patient Instructions (Addendum)
[facility] at [facility] Discharge Instructions  You were seen today by Dr. Katragadda. He went over your recent lab results. He will see you back in 3 weeks for labs and follow up.   Thank you for choosing [facility] at [facility] to provide your oncology and hematology care.  To afford each patient quality time with our provider, please arrive at least 15 minutes before your scheduled appointment time.   If you have a lab appointment with the Cancer Center please come in thru the  Main Entrance and check in at the main information desk  You need to re-schedule your appointment should you arrive 10 or more minutes late.  We strive to give you quality time with our providers, and arriving late affects you and other patients whose appointments are after yours.  Also, if you no show three or more times for appointments you may be dismissed from the clinic at the providers discretion.     Again, thank you for choosing [facility].  Our hope is that these requests will decrease the amount of time that you wait before being seen by our physicians.       _____________________________________________________________  Should you have questions after your visit to [facility], please contact our office at [phone] between the hours of 8:00 a.m. and 4:30 p.m.  Voicemails left after 4:00 p.m. will not be returned until the following business day.  For prescription refill requests, have your pharmacy contact our office and allow 72 hours.    Cancer Center Support Programs:   > Cancer Support Group  2nd Tuesday of the month 1pm-2pm, Journey Room    

## 2019-06-24 NOTE — Progress Notes (Signed)
Patient has been assessed, vital signs and labs have been reviewed by Dr. Katragadda. ANC, Creatinine, LFTs, and Platelets are within treatment parameters per Dr. Katragadda. The patient is good to proceed with treatment at this time.  

## 2019-06-24 NOTE — Progress Notes (Signed)
Patient tolerated therapy with no complaints voiced.  Side effects with management reviewed with understanding verbalized.  Port site clean and dry with no bruising or swelling noted at site.  Good blood return noted before and after administration of therapy.  Band aid applied.  Patient left ambulatory with VSS and no s/s of distress noted.

## 2019-06-27 NOTE — Assessment & Plan Note (Signed)
1.  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  No other targetable mutations. -6 cycles of carboplatin, paclitaxel and pembrolizumab from 05/03/2018 through 08/21/2018. -Maintenance pembrolizumab from 09/11/2018. -PET scan on 06/09/2019 reviewed by me shows slight increase in hypermetabolic them in the floor of the mouth and lymph nodes.  One of the lymph nodes has grown by few millimeters. -He would not consider results to progression.  I have reviewed his labs.  White count is 3.5 and platelet count is 189.  LFTs are normal. -She does not have any immunotherapy related side effects.  Hence have recommended continuation of treatments.  I will see him back in 6 weeks for follow-up.  2.  Hypothyroidism: -His Synthroid was increased to 75 mcg in the last 2 to 3 weeks. -Today TSH is 7.620.  Hence I would not make any changes.  I will follow it in 6 weeks.  3.  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 -PET scan was not suggestive of any recurrence.  4.  Bilateral knee pains: -He will continue hydrocodone 5/325 1 to 2 tablets daily.  I have given refill.

## 2019-07-17 ENCOUNTER — Inpatient Hospital Stay (HOSPITAL_COMMUNITY): Payer: Medicare HMO

## 2019-07-17 ENCOUNTER — Other Ambulatory Visit (HOSPITAL_COMMUNITY): Payer: Self-pay | Admitting: *Deleted

## 2019-07-17 ENCOUNTER — Inpatient Hospital Stay (HOSPITAL_COMMUNITY): Payer: Medicare HMO | Attending: Hematology | Admitting: Hematology

## 2019-07-17 ENCOUNTER — Other Ambulatory Visit: Payer: Self-pay

## 2019-07-17 ENCOUNTER — Encounter (HOSPITAL_COMMUNITY): Payer: Self-pay | Admitting: Hematology

## 2019-07-17 VITALS — BP 102/59 | HR 58 | Resp 16

## 2019-07-17 DIAGNOSIS — C109 Malignant neoplasm of oropharynx, unspecified: Secondary | ICD-10-CM

## 2019-07-17 DIAGNOSIS — M25562 Pain in left knee: Secondary | ICD-10-CM | POA: Diagnosis not present

## 2019-07-17 DIAGNOSIS — C7802 Secondary malignant neoplasm of left lung: Secondary | ICD-10-CM | POA: Insufficient documentation

## 2019-07-17 DIAGNOSIS — C3492 Malignant neoplasm of unspecified part of left bronchus or lung: Secondary | ICD-10-CM

## 2019-07-17 DIAGNOSIS — Z87891 Personal history of nicotine dependence: Secondary | ICD-10-CM | POA: Diagnosis not present

## 2019-07-17 DIAGNOSIS — M25561 Pain in right knee: Secondary | ICD-10-CM | POA: Insufficient documentation

## 2019-07-17 DIAGNOSIS — C01 Malignant neoplasm of base of tongue: Secondary | ICD-10-CM | POA: Diagnosis not present

## 2019-07-17 DIAGNOSIS — Z923 Personal history of irradiation: Secondary | ICD-10-CM | POA: Insufficient documentation

## 2019-07-17 DIAGNOSIS — E039 Hypothyroidism, unspecified: Secondary | ICD-10-CM | POA: Diagnosis not present

## 2019-07-17 DIAGNOSIS — Z5112 Encounter for antineoplastic immunotherapy: Secondary | ICD-10-CM | POA: Insufficient documentation

## 2019-07-17 DIAGNOSIS — Z9221 Personal history of antineoplastic chemotherapy: Secondary | ICD-10-CM | POA: Diagnosis not present

## 2019-07-17 LAB — CBC WITH DIFFERENTIAL/PLATELET
Abs Immature Granulocytes: 0.01 10*3/uL (ref 0.00–0.07)
Basophils Absolute: 0 10*3/uL (ref 0.0–0.1)
Basophils Relative: 0 %
Eosinophils Absolute: 0.2 10*3/uL (ref 0.0–0.5)
Eosinophils Relative: 2 %
HCT: 40.8 % (ref 39.0–52.0)
Hemoglobin: 13.2 g/dL (ref 13.0–17.0)
Immature Granulocytes: 0 %
Lymphocytes Relative: 19 %
Lymphs Abs: 1.2 10*3/uL (ref 0.7–4.0)
MCH: 30.6 pg (ref 26.0–34.0)
MCHC: 32.4 g/dL (ref 30.0–36.0)
MCV: 94.4 fL (ref 80.0–100.0)
Monocytes Absolute: 0.4 10*3/uL (ref 0.1–1.0)
Monocytes Relative: 7 %
Neutro Abs: 4.6 10*3/uL (ref 1.7–7.7)
Neutrophils Relative %: 72 %
Platelets: 187 10*3/uL (ref 150–400)
RBC: 4.32 MIL/uL (ref 4.22–5.81)
RDW: 15.1 % (ref 11.5–15.5)
WBC: 6.4 10*3/uL (ref 4.0–10.5)
nRBC: 0 % (ref 0.0–0.2)

## 2019-07-17 LAB — COMPREHENSIVE METABOLIC PANEL
ALT: 24 U/L (ref 0–44)
AST: 24 U/L (ref 15–41)
Albumin: 4.2 g/dL (ref 3.5–5.0)
Alkaline Phosphatase: 47 U/L (ref 38–126)
Anion gap: 14 (ref 5–15)
BUN: 29 mg/dL — ABNORMAL HIGH (ref 8–23)
CO2: 24 mmol/L (ref 22–32)
Calcium: 9.5 mg/dL (ref 8.9–10.3)
Chloride: 101 mmol/L (ref 98–111)
Creatinine, Ser: 2 mg/dL — ABNORMAL HIGH (ref 0.61–1.24)
GFR calc Af Amer: 38 mL/min — ABNORMAL LOW (ref >60)
GFR calc non Af Amer: 33 mL/min — ABNORMAL LOW (ref >60)
Glucose, Bld: 99 mg/dL (ref 70–99)
Potassium: 4.1 mmol/L (ref 3.5–5.1)
Sodium: 139 mmol/L (ref 135–145)
Total Bilirubin: 0.6 mg/dL (ref 0.3–1.2)
Total Protein: 7.3 g/dL (ref 6.5–8.1)

## 2019-07-17 LAB — TSH: TSH: 4.377 u[IU]/mL (ref 0.350–4.500)

## 2019-07-17 MED ORDER — HEPARIN SOD (PORK) LOCK FLUSH 100 UNIT/ML IV SOLN
500.0000 [IU] | Freq: Once | INTRAVENOUS | Status: AC | PRN
  Administered 2019-07-17: 500 [IU]

## 2019-07-17 MED ORDER — SODIUM CHLORIDE 0.9 % IV SOLN
200.0000 mg | Freq: Once | INTRAVENOUS | Status: AC
  Administered 2019-07-17: 200 mg via INTRAVENOUS
  Filled 2019-07-17: qty 8

## 2019-07-17 MED ORDER — HYDROCODONE-ACETAMINOPHEN 5-325 MG PO TABS: ORAL_TABLET | ORAL | 0 refills | Status: DC

## 2019-07-17 MED ORDER — SODIUM CHLORIDE 0.9% FLUSH: 10.0000 mL | INTRAVENOUS | Status: DC | PRN

## 2019-07-17 MED ORDER — SODIUM CHLORIDE 0.9 % IV SOLN: Freq: Once | INTRAVENOUS | Status: AC

## 2019-07-17 NOTE — Progress Notes (Signed)
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   07/28/2014 Initial Diagnosis   Oropharyngeal cancer   08/03/2014 Imaging   CT CAP- L supraclavicular lymphadenopathy is not completely visualized. This is better seen on the previous neck CT from 07/27/2014. Otherwise, no evidence for metastatic disease in the chest, abdomen, or pelvis.   08/03/2014 Imaging   Bone scan- Uptake at adjacent anterior LEFT 6, 7, 8 ribs likely representing trauma/fractures. Questionable nonspecific increased tracer localization at the posterior RIGHT 8th and 9th ribs, the adjacent nature which raises a a question of trauma as well   08/06/2014 Pathology Results   Dr. Benjamine Mola- Oropharynx, biopsy, Left - INVASIVE SQUAMOUS CELL CARCINOMA.   08/12/2014 Procedure   Dr. Enrique Sack- 1. Multiple extraction of tooth numbers 6, 17, 22, 23, 24, 25, 26, and 27. 3 Quadrants of alveoloplasty   08/17/2014 Pathology Results   PORT and G-TUBE placed by Dr. Carlis Stable.   08/26/2014 PET scan   Large hypermetabolic mass in the left base of tongue. Activity extends across midline to the right base tongue. 2. Intensely hypermetabolic left cervical metastatic lymph nodes. Lymph nodes extend from the left level II position to the left supraclavi   09/01/2014 - 09/22/2014 Chemotherapy   Concurrent chemoradiation with Cisplatin 100 mg/m2 x 2 cycles with Neulasta support. Held cycle #3 d/t renal toxicity.    09/03/2014 - 10/23/2014 Radiation Therapy   Treated in [place], IMRT Isidore Moos).  Base of tongue and bilat neck. Total dose: 70 Gy in 35  fractions. (of note, he did miss several treatments requiring BID dosing towards the end of treatment).    01/25/2015 PET scan   Near complete resolution of metabolic activity at the base of tongue. Minimal residual activity is likely post treatment effect. 2. Complete resolution of metabolic activity above LEFT cervical lymph nodes. No evidence of residual metabolically active    1/42/3953 Procedure   Port-a-cath removed [name])    05/03/2018 - 09/10/2018 Chemotherapy   The patient had palonosetron (ALOXI) injection 0.25 mg, 0.25 mg, Intravenous,  Once, 6 of 6 cycles Administration: 0.25 mg (05/03/2018), 0.25 mg (05/24/2018), 0.25 mg (06/14/2018), 0.25 mg (07/09/2018), 0.25 mg (07/30/2018), 0.25 mg (08/21/2018) pegfilgrastim-cbqv (UDENYCA) injection 6 mg, 6 mg, Subcutaneous, Once, 5 of 5 cycles Administration: 6 mg (05/27/2018), 6 mg (06/17/2018), 6 mg (07/11/2018), 6 mg (08/01/2018), 6 mg (08/23/2018) CARBOplatin (PARAPLATIN) 380 mg in sodium chloride 0.9 % 250 mL chemo infusion, 380 mg (100 % of original dose 381 mg), Intravenous,  Once, 6 of 6 cycles Dose modification:   (original dose 381 mg, Cycle 1),   (original dose 309.5 mg, Cycle 2), 307.5 mg (original dose 309.5 mg, Cycle 5) Administration: 380 mg (05/03/2018), 310 mg (05/24/2018), 310 mg (06/14/2018), 340 mg (07/09/2018), 310 mg (07/30/2018), 350 mg (08/21/2018) PACLitaxel (TAXOL) 330 mg in sodium chloride 0.9 % 500 mL chemo infusion (> 84m/m2), 175 mg/m2 = 330 mg (100 % of original dose 175 mg/m2), Intravenous,  Once, 6 of 6 cycles Dose modification: 175 mg/m2 (original dose 175 mg/m2, Cycle 1, Reason: Patient Age) Administration: 330 mg (05/03/2018), 330 mg (05/24/2018), 330  mg (06/14/2018), 330 mg (07/09/2018), 330 mg (07/30/2018), 330 mg (08/21/2018)  for chemotherapy treatment.    05/24/2018 -  Chemotherapy   The patient had pembrolizumab (KEYTRUDA) 200 mg in sodium chloride 0.9 % 50 mL chemo infusion, 200 mg, Intravenous, Once, 20 of 20 cycles  Administration: 200 mg (05/24/2018), 200 mg (06/14/2018), 200 mg (07/09/2018), 200 mg (08/21/2018), 200 mg (09/11/2018), 200 mg (10/02/2018), 200 mg (10/23/2018), 200 mg (11/13/2018), 200 mg (12/04/2018), 200 mg (12/25/2018), 200 mg (01/22/2019), 200 mg (02/12/2019), 200 mg (03/05/2019), 200 mg (03/26/2019), 200 mg (04/16/2019), 200 mg (05/07/2019), 200 mg (06/03/2019), 200 mg (06/24/2019), 200 mg (07/17/2019)  for chemotherapy treatment.    Squamous cell lung cancer, left ([facility])  06/14/2018 Initial Diagnosis   Squamous cell lung cancer, left ([facility])      CANCER STAGING: Cancer Staging Oropharyngeal carcinoma ([facility]) Staging form: Pharynx - Oropharynx, AJCC 7th Edition - Clinical: Stage IVA (T4a, N2b, M0) - Unsigned    INTERVAL HISTORY:  Alexander [name] 69 y.o. male seen for follow-up and toxicity assessment prior to next cycle of therapy for his metastatic lung cancer.  Appetite is reported as 50%.  Energy levels are 75%.  Weight is stable.  Pain in the left knee is reported as 10 out of 10.  Denies any diarrhea.  No shortness of breath on exertion.  REVIEW OF SYSTEMS:  Review of Systems  Musculoskeletal: Positive for arthralgias.  All other systems reviewed and are negative.    PAST MEDICAL/SURGICAL HISTORY:  Past Medical History:  Diagnosis Date  . GERD (gastroesophageal reflux disease)   . 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 Oropharyngeal cancer ([facility]) 07/28/2014   dx. 3 weeks ago.- Dr. Oneal [facility] [place], [place].  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 BIOPSY  01/15/2018   Procedure: BIOPSY;  Surgeon: Danie Binder, MD;  [facility];  Service: Endoscopy;;  gastric  . COLONOSCOPY N/A 03/13/2016   Procedure: COLONOSCOPY;  Surgeon: Danie Binder, MD;  [facility];  Service: Endoscopy;  Laterality: N/A;  2:15 PM  .  ESOPHAGOGASTRODUODENOSCOPY (EGD) WITH PROPOFOL N/A 08/17/2014   Procedure: ESOPHAGOGASTRODUODENOSCOPY (EGD) WITH PROPOFOL (procedure #1);  Surgeon: Aviva Signs Md, MD;  Location: [facility];  Service: General;  Laterality: N/A;  . ESOPHAGOGASTRODUODENOSCOPY (EGD) WITH PROPOFOL N/A 01/15/2018   Procedure: ESOPHAGOGASTRODUODENOSCOPY (EGD) WITH PROPOFOL;  Surgeon: Danie Binder, MD;  [facility];  Service: Endoscopy;  Laterality: N/A;  9:30am  . MULTIPLE EXTRACTIONS WITH ALVEOLOPLASTY N/A 08/12/2014   Procedure: Extraction of tooth #'s 6,17,22,23,24,25,26,27 with alveoloplasty;  Surgeon: Lenn Cal, DDS;  Location: [facility];  Service: Oral Surgery;  Laterality: N/A;  . PANENDOSCOPY N/A 08/06/2014   Procedure: PANENDOSCOPY WITH BIOPSY;  Surgeon: Leta Baptist, MD;  Location: [facility];  Service: ENT;  Laterality: N/A;  . PEG PLACEMENT Left 08/17/14  . PEG PLACEMENT N/A 08/17/2014   Procedure: PERCUTANEOUS ENDOSCOPIC GASTROSTOMY (PEG) PLACEMENT (procedure #1);  Surgeon: Aviva Signs Md, MD;  Location: [facility];  Service: General;  Laterality: N/A;  . PORT-A-CATH REMOVAL Right 07/17/2016   Procedure: MINOR REMOVAL PORT-A-CATH;  Surgeon: Aviva Signs, MD;  Location: [facility];  Service: General;  Laterality: Right;  . PORTACATH PLACEMENT Right 08/17/14  . PORTACATH PLACEMENT Right 08/17/2014   Procedure: INSERTION PORT-A-CATH (procedure #2);  Surgeon: Elta Guadeloupe  Judithann Sauger, MD;  Location: [facility];  Service: General;  Laterality: Right;  . PORTACATH PLACEMENT Left 04/26/2018   Procedure: INSERTION PORT-A-CATH (attached catheter in left subclavian);  Surgeon: Aviva Signs, MD;  Location[facility];  Service: General;  Laterality: Left;  . SAVORY DILATION N/A 01/15/2018   Procedure: SAVORY DILATION;  Surgeon: Danie Binder, MD;  [facility];  Service: Endoscopy;  Laterality: N/A;  . VIDEO BRONCHOSCOPY WITH ENDOBRONCHIAL ULTRASOUND N/A 04/15/2018   Procedure: VIDEO BRONCHOSCOPY WITH  ENDOBRONCHIAL ULTRASOUND;  Surgeon: Melrose Nakayama, MD;  Location: [facility];  Service: Thoracic;  Laterality: N/A;     SOCIAL HISTORY:  Social History   Socioeconomic History  . Marital status: Legally Separated    Spouse name: Not on file  . Number of children: 5  . Years of education: Not on file  . Highest education level: Not on file  Occupational History  . Not on file  Tobacco Use  . Smoking status: Former Smoker    Packs/day: 0.50    Years: 30.00    Pack years: 15.00    Quit date: 07/22/2014    Years since quitting: 5.0  . Smokeless tobacco: Never Used  Substance and Sexual Activity  . Alcohol use: Not Currently    Alcohol/week: 0.0 standard drinks    Comment: None currently (11/09/17); previously 1-2 beers on the weekend  . Drug use: No  . Sexual activity: Not on file  Other Topics Concern  . Not on file  Social History Narrative  . Not on file   Social Determinants of Health   Financial Resource Strain:   . Difficulty of Paying Living Expenses:   Food Insecurity:   . Worried About Charity fundraiser in the Last Year:   . Arboriculturist in the Last Year:   Transportation Needs:   . Film/video editor (Medical):   Marland Kitchen Lack of Transportation (Non-Medical):   Physical Activity:   . Days of Exercise per Week:   . Minutes of Exercise per Session:   Stress:   . Feeling of Stress :   Social Connections:   . Frequency of Communication with Friends and Family:   . Frequency of Social Gatherings with Friends and Family:   . Attends Religious Services:   . Active Member of Clubs or Organizations:   . Attends Archivist Meetings:   Marland Kitchen Marital Status:   Intimate Partner Violence:   . Fear of Current or Ex-Partner:   . Emotionally Abused:   Marland Kitchen Physically Abused:   . Sexually Abused:     FAMILY HISTORY:  Family History  Problem Relation Age of Onset  . Colon cancer Neg Hx   . Gastric cancer Neg Hx   . Esophageal cancer Neg Hx     CURRENT  MEDICATIONS:  Outpatient Encounter Medications as of 07/17/2019  Medication Sig  . feeding supplement, ENSURE ENLIVE, (ENSURE ENLIVE) LIQD Take 237 mLs by mouth 4 (four) times daily.   Marland Kitchen levothyroxine (SYNTHROID) 75 MCG tablet TAKE 1 TABLET DAILY BEFORE BREAKFAST.  . Melatonin 10 MG TABS Take 1 tablet by mouth at bedtime.  Marland Kitchen omeprazole (PRILOSEC) 20 MG capsule TAKE 1 CAPSULE BY MOUTH 30 MINUTES PRIOR TO BREAKFAST  . Pembrolizumab (KEYTRUDA IV) Inject into the vein every 21 ( twenty-one) days.  . [DISCONTINUED] HYDROcodone-acetaminophen (NORCO/VICODIN) 5-325 MG tablet TAKE 1 TABLET EVERY 12 HOURS AS NEEDED FOR MODERATE PAIN (Patient not taking: Reported on 07/17/2019)  . [DISCONTINUED]  prochlorperazine (COMPAZINE) 10 MG tablet Take 1 tablet (10 mg total) by mouth every 6 (six) hours as needed (Nausea or vomiting). (Patient not taking: Reported on 11/13/2018)   Facility-Administered Encounter Medications as of 07/17/2019  Medication  . sodium chloride flush (NS) 0.9 % injection 10 mL    ALLERGIES:  No Known Allergies   PHYSICAL EXAM:  ECOG Performance status: 1  Vitals:   07/17/19 1018  BP: (!) 103/57  Pulse: 64  Resp: 18  Temp: 98.6 F (37 C)  SpO2: 96%   Filed Weights   07/17/19 1018  Weight: 144 lb (65.3 kg)    Physical Exam Vitals reviewed.  Constitutional:      Appearance: Normal appearance.  Cardiovascular:     Rate and Rhythm: Normal rate and regular rhythm.     Heart sounds: Normal heart sounds.  Pulmonary:     Effort: Pulmonary effort is normal.     Breath sounds: Normal breath sounds.  Abdominal:     General: There is no distension.     Palpations: Abdomen is soft. There is no mass.  Musculoskeletal:        General: No swelling.  Skin:    General: Skin is warm.  Neurological:     General: No focal deficit present.     Mental Status: He is alert and oriented to person, place, and time.  Psychiatric:        Mood and Affect: Mood normal.        Behavior:  Behavior normal.      LABORATORY DATA:  I have reviewed the labs as listed.  CBC    Component Value Date/Time   WBC 6.4 07/17/2019 0901   RBC 4.32 07/17/2019 0901   HGB 13.2 07/17/2019 0901   HCT 40.8 07/17/2019 0901   PLT 187 07/17/2019 0901   MCV 94.4 07/17/2019 0901   MCH 30.6 07/17/2019 0901   MCHC 32.4 07/17/2019 0901   RDW 15.1 07/17/2019 0901   LYMPHSABS 1.2 07/17/2019 0901   MONOABS 0.4 07/17/2019 0901   EOSABS 0.2 07/17/2019 0901   BASOSABS 0.0 07/17/2019 0901   CMP Latest Ref Rng & Units 07/17/2019 06/24/2019 06/03/2019  Glucose 70 - 99 mg/dL 99 156(H) 134(H)  BUN 8 - 23 mg/dL 29(H) 27(H) 16  Creatinine 0.61 - 1.24 mg/dL 2.00(H) 2.01(H) 1.95(H)  Sodium 135 - 145 mmol/L 139 139 139  Potassium 3.5 - 5.1 mmol/L 4.1 3.8 3.9  Chloride 98 - 111 mmol/L 101 103 102  CO2 22 - 32 mmol/L '24 25 26  '$ Calcium 8.9 - 10.3 mg/dL 9.5 9.2 9.0  Total Protein 6.5 - 8.1 g/dL 7.3 6.8 7.1  Total Bilirubin 0.3 - 1.2 mg/dL 0.6 0.6 0.6  Alkaline Phos 38 - 126 U/L 47 44 47  AST 15 - 41 U/L '24 23 25  '$ ALT 0 - 44 U/L '24 24 20       '$ DIAGNOSTIC IMAGING:  I have independently reviewed scans.   I have reviewed Venita Lick LPN's note and agree with the documentation.  I personally performed a face-to-face visit, made revisions and my assessment and plan is as follows.    ASSESSMENT & PLAN:   Squamous cell lung cancer, left ([facility]) 1.  Advanced squamous cell carcinoma left lung: -PD-L1 not done, foundation 1 MS-stable, no other targetable mutations. -6 cycles of carboplatin, paclitaxel and pembrolizumab from 05/03/2018 through 08/21/2018. -Maintenance pembrolizumab from 09/11/2018. -PET scan on 06/09/2019 shows slight increase in hypermetabolism in the floor of the mouth  and lymph nodes.  One of the lymph nodes has grown by few millimeters. -As this is not a significant progression, we have decided to continue pembrolizumab at this time and rescan in 3 months. -I have reviewed his labs.  LFTs  and CBC are within normal limits.  He will proceed with his treatment today.  We will see him back in 3 weeks for follow-up.  2.  Hypothyroidism: -TSH today is 4.37.  He will continue Synthroid 75 mcg daily.  3.  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 -We will continue to monitor on subsequent PET scans.  4.  Bilateral knee pains: -He will continue hydrocodone 5/325 1 to 2 tablets daily.  Have sent a refill.  5.  Nutrition: -He is drinking 4 cans of boost per day. -He is eating 1 and half meals per day.  Weight stable.     Orders placed this encounter:  No orders of the defined types were placed in this encounter.     Derek Jack, MD [place] [phone]

## 2019-07-17 NOTE — Assessment & Plan Note (Addendum)
1.  Advanced squamous cell carcinoma left lung: -PD-L1 not done, foundation 1 MS-stable, no other targetable mutations. -6 cycles of carboplatin, paclitaxel and pembrolizumab from 05/03/2018 through 08/21/2018. -Maintenance pembrolizumab from 09/11/2018. -PET scan on 06/09/2019 shows slight increase in hypermetabolism in the floor of the mouth and lymph nodes.  One of the lymph nodes has grown by few millimeters. -As this is not a significant progression, we have decided to continue pembrolizumab at this time and rescan in 3 months. -I have reviewed his labs.  LFTs and CBC are within normal limits.  He will proceed with his treatment today.  We will see him back in 3 weeks for follow-up.  2.  Hypothyroidism: -TSH today is 4.37.  He will continue Synthroid 75 mcg daily.  3.  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 -We will continue to monitor on subsequent PET scans.  4.  Bilateral knee pains: -He will continue hydrocodone 5/325 1 to 2 tablets daily.  Have sent a refill.  5.  Nutrition: -He is drinking 4 cans of boost per day. -He is eating 1 and half meals per day.  Weight stable.

## 2019-07-17 NOTE — Progress Notes (Signed)
Patient has been assessed, vital signs and labs have been reviewed by Dr. Katragadda. ANC, Creatinine, LFTs, and Platelets are within treatment parameters per Dr. Katragadda. The patient is good to proceed with treatment at this time.  

## 2019-07-17 NOTE — Progress Notes (Signed)
Alexander [name] presents today for D1C20 Keytruda infusion. He reports his energy levels have been good and that his appetite has been poor. He is drinking Ensure daily but reports drinking it before his meals, and then not being hungry. He denies n/v/d. No new complaints or symptoms since last infusion. Vital signs and lab results stable. Assessed by Dr. Delton Coombes. Per MD and parameters, proceed with treatment today.  Keytruda infusion tolerated without incident or complaints. Vitals stable post infusion. Port flushed and deaccessed. Discharged in satisfactory condition with follow up instructions.

## 2019-07-17 NOTE — Patient Instructions (Signed)
[facility] Discharge Instructions for Patients Receiving Chemotherapy   Beginning January 23rd 2017 lab work for the [facility] will be done in the  Main lab at [facility] on 1st floor. If you have a lab appointment with the [facility] please come in thru the  Main Entrance and check in at the main information desk   Today you received the following chemotherapy agents Keytruda  To help prevent nausea and vomiting after your treatment, we encourage you to take your nausea medication If you develop nausea and vomiting, or diarrhea that is not controlled by your medication, call the clinic.  The clinic phone number is [phone]. Office hours are Monday-Friday 8:30am-5:00pm.  BELOW ARE SYMPTOMS THAT SHOULD BE REPORTED IMMEDIATELY:  *FEVER GREATER THAN 101.0 F  *CHILLS WITH OR WITHOUT FEVER  NAUSEA AND VOMITING THAT IS NOT CONTROLLED WITH YOUR NAUSEA MEDICATION  *UNUSUAL SHORTNESS OF BREATH  *UNUSUAL BRUISING OR BLEEDING  TENDERNESS IN MOUTH AND THROAT WITH OR WITHOUT PRESENCE OF ULCERS  *URINARY PROBLEMS  *BOWEL PROBLEMS  UNUSUAL RASH Items with * indicate a potential emergency and should be followed up as soon as possible. If you have an emergency after office hours please contact your primary care physician or go to the nearest emergency department.  Please call the clinic during office hours if you have any questions or concerns.   You may also contact the Patient Navigator at [phone] should you have any questions or need assistance in obtaining follow up care.      Resources For Cancer Patients and their Caregivers ? American Cancer Society: Can assist with transportation, wigs, general needs, runs Look Good Feel Better.        (940)434-2589 ? Cancer Care: Provides financial assistance, online support groups, medication/co-pay assistance.  1-800-813-HOPE 605-050-2214) ? [place] Assists [place] cancer  patients and their families through emotional , educational and financial support.  580-290-4304 ? [place] DSS Where to apply for food stamps, Medicaid and utility assistance. 475-750-4576 ? [facility]: Transportation to medical appointments. 424 756 6975 ? Social Security Administration: May apply for disability if have a Stage IV cancer. 720-493-6873 6828827096 ? [employer], Disability and Transit Services: Assists with nutrition, care and transit needs. (931)854-8132

## 2019-07-17 NOTE — Patient Instructions (Signed)
[facility] at [facility] Discharge Instructions  Labs drawn from portacath today   Thank you for choosing [facility] at [facility] to provide your oncology and hematology care.  To afford each patient quality time with our provider, please arrive at least 15 minutes before your scheduled appointment time.   If you have a lab appointment with the Cancer Center please come in thru the Main Entrance and check in at the main information desk.  You need to re-schedule your appointment should you arrive 10 or more minutes late.  We strive to give you quality time with our providers, and arriving late affects you and other patients whose appointments are after yours.  Also, if you no show three or more times for appointments you may be dismissed from the clinic at the providers discretion.     Again, thank you for choosing [facility].  Our hope is that these requests will decrease the amount of time that you wait before being seen by our physicians.       _____________________________________________________________  Should you have questions after your visit to [facility], please contact our office at [phone] between the hours of 8:00 a.m. and 4:30 p.m.  Voicemails left after 4:00 p.m. will not be returned until the following business day.  For prescription refill requests, have your pharmacy contact our office and allow 72 hours.    Due to Covid, you will need to wear a mask upon entering the hospital. If you do not have a mask, a mask will be given to you at the Main Entrance upon arrival. For doctor visits, patients may have 1 support person with them. For treatment visits, patients can not have anyone with them due to social distancing guidelines and our immunocompromised population.     

## 2019-07-17 NOTE — Patient Instructions (Addendum)
[facility] at [facility] Discharge Instructions  You were seen today by Dr. Delton Coombes. He went over your recent lab results. He will see you back in 3 weeks for labs, treatment and follow up.   Thank you for choosing [facility] at [facility] to provide your oncology and hematology care.  To afford each patient quality time with our provider, please arrive at least 15 minutes before your scheduled appointment time.   If you have a lab appointment with the [facility] please come in thru the  Main Entrance and check in at the main information desk  You need to re-schedule your appointment should you arrive 10 or more minutes late.  We strive to give you quality time with our providers, and arriving late affects you and other patients whose appointments are after yours.  Also, if you no show three or more times for appointments you may be dismissed from the clinic at the providers discretion.     Again, thank you for choosing [facility].  Our hope is that these requests will decrease the amount of time that you wait before being seen by our physicians.       _____________________________________________________________  Should you have questions after your visit to [facility], please contact our office at [phone] between the hours of 8:00 a.m. and 4:30 p.m.  Voicemails left after 4:00 p.m. will not be returned until the following business day.  For prescription refill requests, have your pharmacy contact our office and allow 72 hours.    Cancer Center Support Programs:   > Cancer Support Group  2nd Tuesday of the month 1pm-2pm, Journey Room

## 2019-08-07 ENCOUNTER — Inpatient Hospital Stay (HOSPITAL_BASED_OUTPATIENT_CLINIC_OR_DEPARTMENT_OTHER): Payer: Medicare HMO | Admitting: Hematology

## 2019-08-07 ENCOUNTER — Inpatient Hospital Stay (HOSPITAL_COMMUNITY): Payer: Medicare HMO

## 2019-08-07 ENCOUNTER — Other Ambulatory Visit (HOSPITAL_COMMUNITY): Payer: Self-pay | Admitting: *Deleted

## 2019-08-07 ENCOUNTER — Other Ambulatory Visit: Payer: Self-pay

## 2019-08-07 ENCOUNTER — Encounter (HOSPITAL_COMMUNITY): Payer: Self-pay | Admitting: Hematology

## 2019-08-07 ENCOUNTER — Inpatient Hospital Stay (HOSPITAL_COMMUNITY): Payer: Medicare HMO | Attending: Hematology

## 2019-08-07 VITALS — BP 106/55 | HR 69 | Temp 97.1°F | Resp 18 | Wt 141.8 lb

## 2019-08-07 VITALS — BP 101/50 | HR 62 | Temp 96.9°F | Resp 18

## 2019-08-07 DIAGNOSIS — Z9221 Personal history of antineoplastic chemotherapy: Secondary | ICD-10-CM | POA: Diagnosis not present

## 2019-08-07 DIAGNOSIS — Z923 Personal history of irradiation: Secondary | ICD-10-CM | POA: Diagnosis not present

## 2019-08-07 DIAGNOSIS — M25561 Pain in right knee: Secondary | ICD-10-CM | POA: Diagnosis not present

## 2019-08-07 DIAGNOSIS — Z87891 Personal history of nicotine dependence: Secondary | ICD-10-CM | POA: Diagnosis not present

## 2019-08-07 DIAGNOSIS — C3492 Malignant neoplasm of unspecified part of left bronchus or lung: Secondary | ICD-10-CM | POA: Diagnosis not present

## 2019-08-07 DIAGNOSIS — M25562 Pain in left knee: Secondary | ICD-10-CM | POA: Insufficient documentation

## 2019-08-07 DIAGNOSIS — Z79899 Other long term (current) drug therapy: Secondary | ICD-10-CM | POA: Diagnosis not present

## 2019-08-07 DIAGNOSIS — Z85810 Personal history of malignant neoplasm of tongue: Secondary | ICD-10-CM | POA: Insufficient documentation

## 2019-08-07 DIAGNOSIS — C109 Malignant neoplasm of oropharynx, unspecified: Secondary | ICD-10-CM

## 2019-08-07 DIAGNOSIS — Z5112 Encounter for antineoplastic immunotherapy: Secondary | ICD-10-CM | POA: Insufficient documentation

## 2019-08-07 DIAGNOSIS — E039 Hypothyroidism, unspecified: Secondary | ICD-10-CM | POA: Insufficient documentation

## 2019-08-07 LAB — CBC WITH DIFFERENTIAL/PLATELET
Abs Immature Granulocytes: 0.01 10*3/uL (ref 0.00–0.07)
Basophils Absolute: 0 10*3/uL (ref 0.0–0.1)
Basophils Relative: 0 %
Eosinophils Absolute: 0.1 10*3/uL (ref 0.0–0.5)
Eosinophils Relative: 3 %
HCT: 38.5 % — ABNORMAL LOW (ref 39.0–52.0)
Hemoglobin: 12.6 g/dL — ABNORMAL LOW (ref 13.0–17.0)
Immature Granulocytes: 0 %
Lymphocytes Relative: 27 %
Lymphs Abs: 1.1 10*3/uL (ref 0.7–4.0)
MCH: 30.9 pg (ref 26.0–34.0)
MCHC: 32.7 g/dL (ref 30.0–36.0)
MCV: 94.4 fL (ref 80.0–100.0)
Monocytes Absolute: 0.5 10*3/uL (ref 0.1–1.0)
Monocytes Relative: 12 %
Neutro Abs: 2.3 10*3/uL (ref 1.7–7.7)
Neutrophils Relative %: 58 %
Platelets: 191 10*3/uL (ref 150–400)
RBC: 4.08 MIL/uL — ABNORMAL LOW (ref 4.22–5.81)
RDW: 15.2 % (ref 11.5–15.5)
WBC: 4 10*3/uL (ref 4.0–10.5)
nRBC: 0 % (ref 0.0–0.2)

## 2019-08-07 LAB — COMPREHENSIVE METABOLIC PANEL
ALT: 28 U/L (ref 0–44)
AST: 26 U/L (ref 15–41)
Albumin: 3.9 g/dL (ref 3.5–5.0)
Alkaline Phosphatase: 58 U/L (ref 38–126)
Anion gap: 10 (ref 5–15)
BUN: 28 mg/dL — ABNORMAL HIGH (ref 8–23)
CO2: 28 mmol/L (ref 22–32)
Calcium: 8.9 mg/dL (ref 8.9–10.3)
Chloride: 102 mmol/L (ref 98–111)
Creatinine, Ser: 1.82 mg/dL — ABNORMAL HIGH (ref 0.61–1.24)
GFR calc Af Amer: 43 mL/min — ABNORMAL LOW (ref >60)
GFR calc non Af Amer: 37 mL/min — ABNORMAL LOW (ref >60)
Glucose, Bld: 120 mg/dL — ABNORMAL HIGH (ref 70–99)
Potassium: 4.4 mmol/L (ref 3.5–5.1)
Sodium: 140 mmol/L (ref 135–145)
Total Bilirubin: 0.5 mg/dL (ref 0.3–1.2)
Total Protein: 6.8 g/dL (ref 6.5–8.1)

## 2019-08-07 LAB — TSH: TSH: 4.089 u[IU]/mL (ref 0.350–4.500)

## 2019-08-07 MED ORDER — SODIUM CHLORIDE 0.9% FLUSH
10.0000 mL | INTRAVENOUS | Status: DC | PRN
  Administered 2019-08-07: 10 mL

## 2019-08-07 MED ORDER — HYDROCODONE-ACETAMINOPHEN 5-325 MG PO TABS: ORAL_TABLET | ORAL | 0 refills | Status: DC

## 2019-08-07 MED ORDER — SODIUM CHLORIDE 0.9 % IV SOLN: Freq: Once | INTRAVENOUS | Status: AC

## 2019-08-07 MED ORDER — SODIUM CHLORIDE 0.9 % IV SOLN
200.0000 mg | Freq: Once | INTRAVENOUS | Status: AC
  Administered 2019-08-07: 200 mg via INTRAVENOUS
  Filled 2019-08-07: qty 8

## 2019-08-07 MED ORDER — HEPARIN SOD (PORK) LOCK FLUSH 100 UNIT/ML IV SOLN
500.0000 [IU] | Freq: Once | INTRAVENOUS | Status: AC | PRN
  Administered 2019-08-07: 500 [IU]

## 2019-08-07 NOTE — Progress Notes (Signed)
Patient presents today for treatment and follow up visit with Dr. Delton Coombes. Vital signs within parameters for treatment. Creatinine today 1.82.   Message received from [facility] LPN/ Dr. Delton Coombes. Proceed with treatment. Labs reviewed by MD. Creatinine 1.82 today. No new orders received.   Treatment given today per MD orders. Tolerated infusion without adverse affects. Vital signs stable. No complaints at this time. Discharged from clinic ambulatory. F/U with [facility] as scheduled.
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
## 2019-08-07 NOTE — Patient Instructions (Signed)
[facility] Discharge Instructions for Patients Receiving Chemotherapy  Today you received the following chemotherapy agents   To help prevent nausea and vomiting after your treatment, we encourage you to take your nausea medication   If you develop nausea and vomiting that is not controlled by your nausea medication, call the clinic.   BELOW ARE SYMPTOMS THAT SHOULD BE REPORTED IMMEDIATELY:  *FEVER GREATER THAN 100.5 F  *CHILLS WITH OR WITHOUT FEVER  NAUSEA AND VOMITING THAT IS NOT CONTROLLED WITH YOUR NAUSEA MEDICATION  *UNUSUAL SHORTNESS OF BREATH  *UNUSUAL BRUISING OR BLEEDING  TENDERNESS IN MOUTH AND THROAT WITH OR WITHOUT PRESENCE OF ULCERS  *URINARY PROBLEMS  *BOWEL PROBLEMS  UNUSUAL RASH Items with * indicate a potential emergency and should be followed up as soon as possible.  Feel free to call the clinic should you have any questions or concerns. The clinic phone number is [phone].  Please show the CHEMO ALERT CARD at check-in to the Emergency Department and triage nurse.   
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
## 2019-08-07 NOTE — Patient Instructions (Addendum)
[place] at [facility] Discharge Instructions  You were seen today by Dr. Delton Coombes. He went over your recent lab results. He will see you back in 3 weeks for labs, treatment, scan and follow up.   Thank you for choosing [place] at [facility] to provide your oncology and hematology care.  To afford each patient quality time with our provider, please arrive at least 15 minutes before your scheduled appointment time.   If you have a lab appointment with the [facility] please come in thru the  Main Entrance and check in at the main information desk  You need to re-schedule your appointment should you arrive 10 or more minutes late.  We strive to give you quality time with our providers, and arriving late affects you and other patients whose appointments are after yours.  Also, if you no show three or more times for appointments you may be dismissed from the clinic at the providers discretion.     Again, thank you for choosing [facility].  Our hope is that these requests will decrease the amount of time that you wait before being seen by our physicians.       _____________________________________________________________  Should you have questions after your visit to [facility], please contact our office at [phone] between the hours of 8:00 a.m. and 4:30 p.m.  Voicemails left after 4:00 p.m. will not be returned until the following business day.  For prescription refill requests, have your pharmacy contact our office and allow 72 hours.    Cancer Center Support Programs:   > Cancer Support Group  2nd Tuesday of the month 1pm-2pm, Journey Room

## 2019-08-07 NOTE — Patient Instructions (Signed)
[place] at [facility] Discharge Instructions Labs drawn form port-a-cath today.   Thank you for choosing [place] at [facility] to provide your oncology and hematology care.  To afford each patient quality time with our provider, please arrive at least 15 minutes before your scheduled appointment time.   If you have a lab appointment with the [facility] please come in thru the Main Entrance and check in at the main information desk.  You need to re-schedule your appointment should you arrive 10 or more minutes late.  We strive to give you quality time with our providers, and arriving late affects you and other patients whose appointments are after yours.  Also, if you no show three or more times for appointments you may be dismissed from the clinic at the providers discretion.     Again, thank you for choosing [facility].  Our hope is that these requests will decrease the amount of time that you wait before being seen by our physicians.       _____________________________________________________________  Should you have questions after your visit to [facility], please contact our office at [phone] between the hours of 8:00 a.m. and 4:30 p.m.  Voicemails left after 4:00 p.m. will not be returned until the following business day.  For prescription refill requests, have your pharmacy contact our office and allow 72 hours.    Due to Covid, you will need to wear a mask upon entering the hospital. If you do not have a mask, a mask will be given to you at the Main Entrance upon arrival. For doctor visits, patients may have 1 support person with them. For treatment visits, patients can not have anyone with them due to social distancing guidelines and our immunocompromised population.

## 2019-08-07 NOTE — Progress Notes (Signed)
Patient has been assessed, vital signs and labs have been reviewed by Dr. Katragadda. ANC, Creatinine, LFTs, and Platelets are within treatment parameters per Dr. Katragadda. The patient is good to proceed with treatment at this time.  

## 2019-08-15 DIAGNOSIS — K219 Gastro-esophageal reflux disease without esophagitis: Secondary | ICD-10-CM | POA: Diagnosis not present

## 2019-08-15 DIAGNOSIS — C3490 Malignant neoplasm of unspecified part of unspecified bronchus or lung: Secondary | ICD-10-CM | POA: Diagnosis not present

## 2019-08-15 DIAGNOSIS — R030 Elevated blood-pressure reading, without diagnosis of hypertension: Secondary | ICD-10-CM | POA: Diagnosis not present

## 2019-08-15 DIAGNOSIS — E039 Hypothyroidism, unspecified: Secondary | ICD-10-CM | POA: Diagnosis not present

## 2019-08-15 DIAGNOSIS — Z87891 Personal history of nicotine dependence: Secondary | ICD-10-CM | POA: Diagnosis not present

## 2019-08-15 DIAGNOSIS — Z8249 Family history of ischemic heart disease and other diseases of the circulatory system: Secondary | ICD-10-CM | POA: Diagnosis not present

## 2019-08-26 NOTE — Progress Notes (Signed)
.  Pharmacist Chemotherapy Monitoring - Follow Up Assessment    I verify that I have reviewed each item in the below checklist:  . Regimen for the patient is scheduled for the appropriate day and plan matches scheduled date. Marland Kitchen Appropriate non-routine labs are ordered dependent on drug ordered. . If applicable, additional medications reviewed and ordered per protocol based on lifetime cumulative doses and/or treatment regimen.   Plan for follow-up and/or issues identified: No . I-vent associated with next due treatment: No . MD and/or nursing notified: No  Alexander [name] 08/26/2019 4:11 PM

## 2019-08-28 ENCOUNTER — Other Ambulatory Visit: Payer: Self-pay

## 2019-08-28 ENCOUNTER — Inpatient Hospital Stay (HOSPITAL_BASED_OUTPATIENT_CLINIC_OR_DEPARTMENT_OTHER): Payer: Medicare HMO | Admitting: Hematology

## 2019-08-28 ENCOUNTER — Inpatient Hospital Stay (HOSPITAL_COMMUNITY): Payer: Medicare HMO

## 2019-08-28 ENCOUNTER — Encounter (HOSPITAL_COMMUNITY): Payer: Self-pay | Admitting: Hematology

## 2019-08-28 ENCOUNTER — Inpatient Hospital Stay (HOSPITAL_COMMUNITY): Payer: Medicare HMO | Attending: Hematology

## 2019-08-28 VITALS — BP 120/56 | HR 55 | Temp 97.7°F | Resp 18

## 2019-08-28 DIAGNOSIS — Z79899 Other long term (current) drug therapy: Secondary | ICD-10-CM | POA: Insufficient documentation

## 2019-08-28 DIAGNOSIS — M25561 Pain in right knee: Secondary | ICD-10-CM | POA: Insufficient documentation

## 2019-08-28 DIAGNOSIS — M25562 Pain in left knee: Secondary | ICD-10-CM | POA: Insufficient documentation

## 2019-08-28 DIAGNOSIS — Z923 Personal history of irradiation: Secondary | ICD-10-CM | POA: Insufficient documentation

## 2019-08-28 DIAGNOSIS — Z9221 Personal history of antineoplastic chemotherapy: Secondary | ICD-10-CM | POA: Insufficient documentation

## 2019-08-28 DIAGNOSIS — Z85810 Personal history of malignant neoplasm of tongue: Secondary | ICD-10-CM | POA: Diagnosis not present

## 2019-08-28 DIAGNOSIS — Z87891 Personal history of nicotine dependence: Secondary | ICD-10-CM | POA: Diagnosis not present

## 2019-08-28 DIAGNOSIS — C109 Malignant neoplasm of oropharynx, unspecified: Secondary | ICD-10-CM

## 2019-08-28 DIAGNOSIS — Z5112 Encounter for antineoplastic immunotherapy: Secondary | ICD-10-CM | POA: Diagnosis not present

## 2019-08-28 DIAGNOSIS — C3492 Malignant neoplasm of unspecified part of left bronchus or lung: Secondary | ICD-10-CM | POA: Diagnosis not present

## 2019-08-28 DIAGNOSIS — E039 Hypothyroidism, unspecified: Secondary | ICD-10-CM | POA: Diagnosis not present

## 2019-08-28 LAB — CBC WITH DIFFERENTIAL/PLATELET
Abs Immature Granulocytes: 0 10*3/uL (ref 0.00–0.07)
Basophils Absolute: 0 10*3/uL (ref 0.0–0.1)
Basophils Relative: 1 %
Eosinophils Absolute: 0.2 10*3/uL (ref 0.0–0.5)
Eosinophils Relative: 4 %
HCT: 41 % (ref 39.0–52.0)
Hemoglobin: 13.1 g/dL (ref 13.0–17.0)
Immature Granulocytes: 0 %
Lymphocytes Relative: 22 %
Lymphs Abs: 0.9 10*3/uL (ref 0.7–4.0)
MCH: 30.3 pg (ref 26.0–34.0)
MCHC: 32 g/dL (ref 30.0–36.0)
MCV: 94.9 fL (ref 80.0–100.0)
Monocytes Absolute: 0.6 10*3/uL (ref 0.1–1.0)
Monocytes Relative: 15 %
Neutro Abs: 2.4 10*3/uL (ref 1.7–7.7)
Neutrophils Relative %: 58 %
Platelets: 160 10*3/uL (ref 150–400)
RBC: 4.32 MIL/uL (ref 4.22–5.81)
RDW: 14.7 % (ref 11.5–15.5)
WBC: 4.1 10*3/uL (ref 4.0–10.5)
nRBC: 0 % (ref 0.0–0.2)

## 2019-08-28 LAB — COMPREHENSIVE METABOLIC PANEL
ALT: 24 U/L (ref 0–44)
AST: 24 U/L (ref 15–41)
Albumin: 4 g/dL (ref 3.5–5.0)
Alkaline Phosphatase: 51 U/L (ref 38–126)
Anion gap: 9 (ref 5–15)
BUN: 32 mg/dL — ABNORMAL HIGH (ref 8–23)
CO2: 27 mmol/L (ref 22–32)
Calcium: 8.8 mg/dL — ABNORMAL LOW (ref 8.9–10.3)
Chloride: 105 mmol/L (ref 98–111)
Creatinine, Ser: 1.96 mg/dL — ABNORMAL HIGH (ref 0.61–1.24)
GFR calc Af Amer: 39 mL/min — ABNORMAL LOW (ref >60)
GFR calc non Af Amer: 34 mL/min — ABNORMAL LOW (ref >60)
Glucose, Bld: 96 mg/dL (ref 70–99)
Potassium: 4.5 mmol/L (ref 3.5–5.1)
Sodium: 141 mmol/L (ref 135–145)
Total Bilirubin: 0.6 mg/dL (ref 0.3–1.2)
Total Protein: 7.2 g/dL (ref 6.5–8.1)

## 2019-08-28 LAB — TSH: TSH: 2.308 u[IU]/mL (ref 0.350–4.500)

## 2019-08-28 MED ORDER — SODIUM CHLORIDE 0.9% FLUSH: 10.0000 mL | INTRAVENOUS | Status: DC | PRN

## 2019-08-28 MED ORDER — SODIUM CHLORIDE 0.9 % IV SOLN: Freq: Once | INTRAVENOUS | Status: AC

## 2019-08-28 MED ORDER — HYDROCODONE-ACETAMINOPHEN 5-325 MG PO TABS: ORAL_TABLET | ORAL | 0 refills | Status: DC

## 2019-08-28 MED ORDER — SODIUM CHLORIDE 0.9 % IV SOLN
200.0000 mg | Freq: Once | INTRAVENOUS | Status: AC
  Administered 2019-08-28: 200 mg via INTRAVENOUS
  Filled 2019-08-28: qty 8

## 2019-08-28 MED ORDER — SODIUM CHLORIDE 0.9% FLUSH
10.0000 mL | INTRAVENOUS | Status: DC | PRN
  Administered 2019-08-28: 10 mL via INTRAVENOUS

## 2019-08-28 MED ORDER — HEPARIN SOD (PORK) LOCK FLUSH 100 UNIT/ML IV SOLN
500.0000 [IU] | Freq: Once | INTRAVENOUS | Status: AC | PRN
  Administered 2019-08-28: 500 [IU]

## 2019-08-28 NOTE — Progress Notes (Signed)
Patient has been assessed, vital signs and labs have been reviewed by Dr. Katragadda. ANC, Creatinine, LFTs, and Platelets are within treatment parameters per Dr. Katragadda. The patient is good to proceed with treatment at this time.  

## 2019-08-28 NOTE — Progress Notes (Signed)
[place], [place] [address]   CLINIC:  Medical Oncology/Hematology  PCP:  Lemmie Evens, MD [place] / [place] [address] [phone]   REASON FOR VISIT:  Follow-up for squamous cell lung cancer  CURRENT THERAPY: Keytruda  BRIEF ONCOLOGIC HISTORY:  Oncology History  Oropharyngeal carcinoma ([facility] [place])  07/27/2014 Imaging   CT neck- Advanced stage oropharyngeal cancer with necrotic adenopathy accounting for the left neck swelling.   07/28/2014 Initial Diagnosis   Oropharyngeal cancer   08/03/2014 Imaging   CT CAP- L supraclavicular lymphadenopathy is not completely visualized. This is better seen on the previous neck CT from 07/27/2014. Otherwise, no evidence for metastatic disease in the chest, abdomen, or pelvis.   08/03/2014 Imaging   Bone scan- Uptake at adjacent anterior LEFT 6, 7, 8 ribs likely representing trauma/fractures. Questionable nonspecific increased tracer localization at the posterior RIGHT 8th and 9th ribs, the adjacent nature which raises a a question of trauma as well   08/06/2014 Pathology Results   Dr. Benjamine Mola- Oropharynx, biopsy, Left - INVASIVE SQUAMOUS CELL CARCINOMA.   08/12/2014 Procedure   Dr. Enrique Sack- 1. Multiple extraction of tooth numbers 6, 17, 22, 23, 24, 25, 26, and 27. 3 Quadrants of alveoloplasty   08/17/2014 Pathology Results   PORT and G-TUBE placed by Dr. Carlis Stable.   08/26/2014 PET scan   Large hypermetabolic mass in the left base of tongue. Activity extends across midline to the right base tongue. 2. Intensely hypermetabolic left cervical metastatic lymph nodes. Lymph nodes extend from the left level II position to the left supraclavi   09/01/2014 - 09/22/2014 Chemotherapy   Concurrent chemoradiation with Cisplatin 100 mg/m2 x 2 cycles with Neulasta support. Held cycle #3 d/t renal toxicity.    09/03/2014 - 10/23/2014 Radiation Therapy   Treated in [place], IMRT Isidore Moos).  Base of tongue and bilat neck.  Total dose: 70 Gy in 35 fractions. (of note, he did miss several treatments requiring BID dosing towards the end of treatment).    01/25/2015 PET scan   Near complete resolution of metabolic activity at the base of tongue. Minimal residual activity is likely post treatment effect. 2. Complete resolution of metabolic activity above LEFT cervical lymph nodes. No evidence of residual metabolically active    1/85/6314 Procedure   Port-a-cath removed [name])    05/03/2018 - 09/10/2018 Chemotherapy   The patient had palonosetron (ALOXI) injection 0.25 mg, 0.25 mg, Intravenous,  Once, 6 of 6 cycles Administration: 0.25 mg (05/03/2018), 0.25 mg (05/24/2018), 0.25 mg (06/14/2018), 0.25 mg (07/09/2018), 0.25 mg (07/30/2018), 0.25 mg (08/21/2018) pegfilgrastim-cbqv (UDENYCA) injection 6 mg, 6 mg, Subcutaneous, Once, 5 of 5 cycles Administration: 6 mg (05/27/2018), 6 mg (06/17/2018), 6 mg (07/11/2018), 6 mg (08/01/2018), 6 mg (08/23/2018) CARBOplatin (PARAPLATIN) 380 mg in sodium chloride 0.9 % 250 mL chemo infusion, 380 mg (100 % of original dose 381 mg), Intravenous,  Once, 6 of 6 cycles Dose modification:   (original dose 381 mg, Cycle 1),   (original dose 309.5 mg, Cycle 2), 307.5 mg (original dose 309.5 mg, Cycle 5) Administration: 380 mg (05/03/2018), 310 mg (05/24/2018), 310 mg (06/14/2018), 340 mg (07/09/2018), 310 mg (07/30/2018), 350 mg (08/21/2018) PACLitaxel (TAXOL) 330 mg in sodium chloride 0.9 % 500 mL chemo infusion (> 38m/m2), 175 mg/m2 = 330 mg (100 % of original dose 175 mg/m2), Intravenous,  Once, 6 of 6 cycles Dose modification: 175 mg/m2 (original dose 175 mg/m2, Cycle 1, Reason: Patient Age) Administration: 330 mg (05/03/2018),  330 mg (05/24/2018), 330 mg (06/14/2018), 330 mg (07/09/2018), 330 mg (07/30/2018), 330 mg (08/21/2018)  for chemotherapy treatment.    05/24/2018 -  Chemotherapy   The patient had pembrolizumab (KEYTRUDA) 200 mg in sodium chloride 0.9 % 50 mL chemo infusion, 200 mg, Intravenous, Once, 21 of  22 cycles Administration: 200 mg (05/24/2018), 200 mg (06/14/2018), 200 mg (07/09/2018), 200 mg (08/21/2018), 200 mg (09/11/2018), 200 mg (10/02/2018), 200 mg (10/23/2018), 200 mg (11/13/2018), 200 mg (12/04/2018), 200 mg (12/25/2018), 200 mg (01/22/2019), 200 mg (02/12/2019), 200 mg (03/05/2019), 200 mg (03/26/2019), 200 mg (04/16/2019), 200 mg (05/07/2019), 200 mg (06/03/2019), 200 mg (06/24/2019), 200 mg (07/17/2019), 200 mg (08/07/2019)  for chemotherapy treatment.    Squamous cell lung cancer, left ([facility])  06/14/2018 Initial Diagnosis   Squamous cell lung cancer, left ([facility])     CANCER STAGING: Cancer Staging Oropharyngeal carcinoma ([place]) Staging form: Pharynx - Oropharynx, AJCC 7th Edition - Clinical: Stage IVA (T4a, N2b, M0) - Unsigned   INTERVAL HISTORY:  Alexander [name], a 69 y.o. male, returns for routine follow-up and consideration for next cycle of chemotherapy. Alexander [name] was last seen on 08/07/2019.  Due for cycle #22 of Keytruda today.   Overall, he tells me he has been feeling pretty well. He denies any fever, chills, N/V/D. His appetite has been good, and he has no issues w/ swallowing. He drinks 4 cans of Boost. He reports no skin rashes.  Overall, he feels ready for next cycle of chemo today.    REVIEW OF SYSTEMS:  Review of Systems  Constitutional: Positive for appetite change (mildly decreased) and fatigue (mild). Negative for chills and fever.  Gastrointestinal: Negative for diarrhea, nausea and vomiting.  Musculoskeletal: Positive for arthralgias (bilat knees).  All other systems reviewed and are negative.   PAST MEDICAL/SURGICAL HISTORY:  Past Medical History:  Diagnosis Date   GERD (gastroesophageal reflux disease)    Mass of neck    dx. oropharyngeal squamous cell carcinoma- Chemo. radiation planned   Oropharyngeal cancer ([facility]) 07/28/2014   dx. 3 weeks ago.- Dr. [facility] [place], [place].   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[facility]) 08/06/2014   SCCa of  Left BOT   Past Surgical History:  Procedure Laterality Date   BIOPSY  01/15/2018   Procedure: BIOPSY;  Surgeon: Danie Binder, MD;  [facility];  Service: Endoscopy;;  gastric   COLONOSCOPY N/A 03/13/2016   Procedure: COLONOSCOPY;  Surgeon: Danie Binder, MD;  [facility];  Service: Endoscopy;  Laterality: N/A;  2:15 PM   ESOPHAGOGASTRODUODENOSCOPY (EGD) WITH PROPOFOL N/A 08/17/2014   Procedure: ESOPHAGOGASTRODUODENOSCOPY (EGD) WITH PROPOFOL (procedure #1);  Surgeon: Aviva Signs Md, MD;  Location: [facility];  Service: General;  Laterality: N/A;   ESOPHAGOGASTRODUODENOSCOPY (EGD) WITH PROPOFOL N/A 01/15/2018   Procedure: ESOPHAGOGASTRODUODENOSCOPY (EGD) WITH PROPOFOL;  Surgeon: Danie Binder, MD;  [facility];  Service: Endoscopy;  Laterality: N/A;  9:30am   MULTIPLE EXTRACTIONS WITH ALVEOLOPLASTY N/A 08/12/2014   Procedure: Extraction of tooth #'s 6,17,22,23,24,25,26,27 with alveoloplasty;  Surgeon: Lenn Cal, DDS;  Location[facility];  Service: Oral Surgery;  Laterality: N/A;   PANENDOSCOPY N/A 08/06/2014   Procedure: PANENDOSCOPY WITH BIOPSY;  Surgeon: Leta Baptist, MD;  Location: [facility];  Service: ENT;  Laterality: N/A;   PEG PLACEMENT Left 08/17/14   PEG PLACEMENT N/A 08/17/2014   Procedure: PERCUTANEOUS ENDOSCOPIC GASTROSTOMY (PEG) PLACEMENT (procedure #1);  Surgeon: Aviva Signs Md, MD;  Location: [facility];  Service: General;  Laterality: N/A;   PORT-A-CATH REMOVAL Right 07/17/2016   Procedure: MINOR REMOVAL PORT-A-CATH;  Surgeon: Aviva Signs, MD;  Location[facility];  Service: General;  Laterality: Right;   PORTACATH PLACEMENT Right 08/17/14   PORTACATH PLACEMENT Right 08/17/2014   Procedure: INSERTION PORT-A-CATH (procedure #2);  Surgeon: Aviva Signs Md, MD;  Location[facility];  Service: General;  Laterality: Right;   PORTACATH PLACEMENT Left 04/26/2018   Procedure: INSERTION PORT-A-CATH (attached catheter in left  subclavian);  Surgeon: Aviva Signs, MD;  Location[facility];  Service: General;  Laterality: Left;   SAVORY DILATION N/A 01/15/2018   Procedure: SAVORY DILATION;  Surgeon: Danie Binder, MD;  [facility];  Service: Endoscopy;  Laterality: N/A;   VIDEO BRONCHOSCOPY WITH ENDOBRONCHIAL ULTRASOUND N/A 04/15/2018   Procedure: VIDEO BRONCHOSCOPY WITH ENDOBRONCHIAL ULTRASOUND;  Surgeon: Melrose Nakayama, MD;  Location: [facility];  Service: Thoracic;  Laterality: N/A;    SOCIAL HISTORY:  Social History   Socioeconomic History   Marital status: Legally Separated    Spouse name: Not on file   Number of children: 5   Years of education: Not on file   Highest education level: Not on file  Occupational History   Not on file  Tobacco Use   Smoking status: Former Smoker    Packs/day: 0.50    Years: 30.00    Pack years: 15.00    Quit date: 07/22/2014    Years since quitting: 5.1   Smokeless tobacco: Never Used  Substance and Sexual Activity   Alcohol use: Not Currently    Alcohol/week: 0.0 standard drinks    Comment: None currently (11/09/17); previously 1-2 beers on the weekend   Drug use: No   Sexual activity: Not on file  Other Topics Concern   Not on file  Social History Narrative   Not on file   Social Determinants of Health   Financial Resource Strain:    Difficulty of Paying Living Expenses:   Food Insecurity:    Worried About Charity fundraiser in the Last Year:    Arboriculturist in the Last Year:   Transportation Needs:    Film/video editor (Medical):    Lack of Transportation (Non-Medical):   Physical Activity:    Days of Exercise per Week:    Minutes of Exercise per Session:   Stress:    Feeling of Stress :   Social Connections:    Frequency of Communication with Friends and Family:    Frequency of Social Gatherings with Friends and Family:    Attends Religious Services:    Active Member of Clubs or Organizations:     Attends Music therapist:    Marital Status:   Intimate Partner Violence:    Fear of Current or Ex-Partner:    Emotionally Abused:    Physically Abused:    Sexually Abused:     FAMILY HISTORY:  Family History  Problem Relation Age of Onset   Colon cancer Neg Hx    Gastric cancer Neg Hx    Esophageal cancer Neg Hx     CURRENT MEDICATIONS:  Current Outpatient Medications  Medication Sig Dispense Refill   feeding supplement, ENSURE ENLIVE, (ENSURE ENLIVE) LIQD Take 237 mLs by mouth 4 (four) times daily.      HYDROcodone-acetaminophen (NORCO/VICODIN) 5-325 MG tablet TAKE 1 TABLET EVERY 12 HOURS AS NEEDED FOR MODERATE PAIN 30 tablet 0   levothyroxine (SYNTHROID) 75 MCG tablet TAKE  1 TABLET DAILY BEFORE BREAKFAST. 30 tablet 0   Melatonin 10 MG TABS Take 1 tablet by mouth at bedtime.     naproxen sodium (ALEVE) 220 MG tablet Take 220 mg by mouth daily as needed.     omeprazole (PRILOSEC) 20 MG capsule TAKE 1 CAPSULE BY MOUTH 30 MINUTES PRIOR TO BREAKFAST 90 capsule 3   Pembrolizumab (KEYTRUDA IV) Inject into the vein every 21 ( twenty-one) days.     No current facility-administered medications for this visit.   Facility-Administered Medications Ordered in Other Visits  Medication Dose Route Frequency Provider Last Rate Last Admin   sodium chloride flush (NS) 0.9 % injection 10 mL  10 mL Intracatheter PRN Derek Jack, MD   10 mL at 06/24/19 0925   sodium chloride flush (NS) 0.9 % injection 10 mL  10 mL Intravenous PRN Derek Jack, MD   10 mL at 08/28/19 0944    ALLERGIES:  No Known Allergies  PHYSICAL EXAM:  Performance status (ECOG): 1 - Symptomatic but completely ambulatory  There were no vitals filed for this visit. Wt Readings from Last 3 Encounters:  08/07/19 141 lb 12.8 oz (64.3 kg)  07/17/19 144 lb (65.3 kg)  06/24/19 143 lb 12.8 oz (65.2 kg)   Physical Exam Vitals reviewed.  Constitutional:      Appearance: Normal  appearance.  Cardiovascular:     Rate and Rhythm: Normal rate and regular rhythm.     Pulses: Normal pulses.     Heart sounds: Normal heart sounds.  Pulmonary:     Effort: Pulmonary effort is normal.     Breath sounds: Normal breath sounds.  Abdominal:     Palpations: Abdomen is soft.  Neurological:     General: No focal deficit present.     Mental Status: He is alert and oriented to person, place, and time.  Psychiatric:        Mood and Affect: Mood normal.        Behavior: Behavior normal.      LABORATORY DATA:  I have reviewed the labs as listed.  CBC Latest Ref Rng & Units 08/07/2019 07/17/2019 06/24/2019  WBC 4.0 - 10.5 K/uL 4.0 6.4 3.5(L)  Hemoglobin 13.0 - 17.0 g/dL 12.6(L) 13.2 12.4(L)  Hematocrit 39.0 - 52.0 % 38.5(L) 40.8 38.7(L)  Platelets 150 - 400 K/uL 191 187 189   CMP Latest Ref Rng & Units 08/07/2019 07/17/2019 06/24/2019  Glucose 70 - 99 mg/dL 120(H) 99 156(H)  BUN 8 - 23 mg/dL 28(H) 29(H) 27(H)  Creatinine 0.61 - 1.24 mg/dL 1.82(H) 2.00(H) 2.01(H)  Sodium 135 - 145 mmol/L 140 139 139  Potassium 3.5 - 5.1 mmol/L 4.4 4.1 3.8  Chloride 98 - 111 mmol/L 102 101 103  CO2 22 - 32 mmol/L _0 Calcium 8.9 - 10.3 mg/dL 8.9 9.5 9.2  Total Protein 6.5 - 8.1 g/dL 6.8 7.3 6.8  Total Bilirubin 0.3 - 1.2 mg/dL 0.5 0.6 0.6  Alkaline Phos 38 - 126 U/L 58 47 44  AST 15 - 41 U/L _1 ALT 0 - 44 U/L _2 DIAGNOSTIC IMAGING:  I have independently reviewed the scans and discussed with the patient.   ASSESSMENT:  1.  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 -6 cycles of carboplatin, paclitaxel and pembrolizumab from 05/03/2018 through 08/21/2018. -Maintenance pembrolizumab started on 09/11/2018. -PET scan on 06/09/2019 showed slight increase in hypermetabolism  in the floor of the mouth and lymph nodes.  One of the lymph nodes has grown by few millimeters. -As this was not a  significant progression, we have continued pembrolizumab.  2.  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   PLAN:  1.  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  He does not have any immunotherapy related side effects. -He will proceed with pembrolizumab today. -We will schedule him for PET CT scan prior to next visit.  2.  Hypothyroidism: -TSH is 2.3 today.  Continue Synthroid 75 mcg.  3.  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  4.  Bilateral knee pains: -Well controlled with hydrocodone.  I have sent a refill.  5.  Nutrition: -Continue 4 cans of boost per day.  Weight has been stable.   Orders placed this encounter:  No orders of the defined types were placed in this encounter.    Derek Jack, MD [facility] [phone]   I, Jacqualyn Posey, am acting as a scribe for Dr. Sanda Linger.  I, Derek Jack MD, have reviewed the above documentation for accuracy and completeness, and I agree with the above.

## 2019-08-28 NOTE — Patient Instructions (Signed)
[facility] Discharge Instructions for Patients Receiving Chemotherapy  Today you received the following chemotherapy agents   To help prevent nausea and vomiting after your treatment, we encourage you to take your nausea medication   If you develop nausea and vomiting that is not controlled by your nausea medication, call the clinic.   BELOW ARE SYMPTOMS THAT SHOULD BE REPORTED IMMEDIATELY:  *FEVER GREATER THAN 100.5 F  *CHILLS WITH OR WITHOUT FEVER  NAUSEA AND VOMITING THAT IS NOT CONTROLLED WITH YOUR NAUSEA MEDICATION  *UNUSUAL SHORTNESS OF BREATH  *UNUSUAL BRUISING OR BLEEDING  TENDERNESS IN MOUTH AND THROAT WITH OR WITHOUT PRESENCE OF ULCERS  *URINARY PROBLEMS  *BOWEL PROBLEMS  UNUSUAL RASH Items with * indicate a potential emergency and should be followed up as soon as possible.  Feel free to call the clinic should you have any questions or concerns. The clinic phone number is [phone].  Please show the CHEMO ALERT CARD at check-in to the Emergency Department and triage nurse.   

## 2019-08-28 NOTE — Progress Notes (Signed)
Labs reviewed today by MD. BUN/Creatinine noted. 32/1.96 Proceed with treatment per MD.   Treatment given per orders. Patient tolerated it well without problems. Vitals stable and discharged home from clinic ambulatory. Follow up as scheduled.

## 2019-08-28 NOTE — Patient Instructions (Signed)
[place] at [facility] Discharge Instructions  You were seen today by Dr. Delton Coombes. He went over your recent results. You will get a PET scan prior to your next visit. Dr. Delton Coombes will see you back in 3 weeks for labs and follow up.   Thank you for choosing [place] at [facility] to provide your oncology and hematology care.  To afford each patient quality time with our provider, please arrive at least 15 minutes before your scheduled appointment time.   If you have a lab appointment with the [facility] please come in thru the  Main Entrance and check in at the main information desk  You need to re-schedule your appointment should you arrive 10 or more minutes late.  We strive to give you quality time with our providers, and arriving late affects you and other patients whose appointments are after yours.  Also, if you no show three or more times for appointments you may be dismissed from the clinic at the providers discretion.     Again, thank you for choosing [facility].  Our hope is that these requests will decrease the amount of time that you wait before being seen by our physicians.       _____________________________________________________________  Should you have questions after your visit to [facility], please contact our office at [phone] between the hours of 8:00 a.m. and 4:30 p.m.  Voicemails left after 4:00 p.m. will not be returned until the following business day.  For prescription refill requests, have your pharmacy contact our office and allow 72 hours.    Cancer Center Support Programs:   > Cancer Support Group  2nd Tuesday of the month 1pm-2pm, Journey Room

## 2019-09-15 ENCOUNTER — Encounter (HOSPITAL_COMMUNITY)
Admission: RE | Admit: 2019-09-15 | Discharge: 2019-09-15 | Disposition: A | Discharge status: Home / Self Care | Payer: Medicare HMO | Source: Ambulatory Visit | Attending: Hematology | Admitting: Hematology

## 2019-09-15 ENCOUNTER — Other Ambulatory Visit: Payer: Self-pay

## 2019-09-15 DIAGNOSIS — C760 Malignant neoplasm of head, face and neck: Secondary | ICD-10-CM | POA: Diagnosis not present

## 2019-09-15 DIAGNOSIS — C3492 Malignant neoplasm of unspecified part of left bronchus or lung: Secondary | ICD-10-CM

## 2019-09-15 DIAGNOSIS — C109 Malignant neoplasm of oropharynx, unspecified: Secondary | ICD-10-CM

## 2019-09-15 MED ORDER — FLUDEOXYGLUCOSE F - 18 (FDG) INJECTION
8.2200 | Freq: Once | INTRAVENOUS | Status: AC | PRN
  Administered 2019-09-15: 8.22 via INTRAVENOUS

## 2019-09-18 ENCOUNTER — Inpatient Hospital Stay (HOSPITAL_BASED_OUTPATIENT_CLINIC_OR_DEPARTMENT_OTHER): Payer: Medicare HMO | Admitting: Nurse Practitioner

## 2019-09-18 ENCOUNTER — Other Ambulatory Visit (HOSPITAL_COMMUNITY): Payer: Self-pay | Admitting: *Deleted

## 2019-09-18 ENCOUNTER — Other Ambulatory Visit: Payer: Self-pay

## 2019-09-18 ENCOUNTER — Inpatient Hospital Stay (HOSPITAL_COMMUNITY): Payer: Medicare HMO

## 2019-09-18 VITALS — BP 96/60 | HR 58 | Temp 97.7°F | Resp 18

## 2019-09-18 VITALS — BP 101/57 | HR 60 | Temp 97.8°F | Resp 18 | Wt 140.2 lb

## 2019-09-18 DIAGNOSIS — C3492 Malignant neoplasm of unspecified part of left bronchus or lung: Secondary | ICD-10-CM

## 2019-09-18 DIAGNOSIS — E039 Hypothyroidism, unspecified: Secondary | ICD-10-CM

## 2019-09-18 DIAGNOSIS — Z923 Personal history of irradiation: Secondary | ICD-10-CM | POA: Diagnosis not present

## 2019-09-18 DIAGNOSIS — Z87891 Personal history of nicotine dependence: Secondary | ICD-10-CM | POA: Diagnosis not present

## 2019-09-18 DIAGNOSIS — Z5112 Encounter for antineoplastic immunotherapy: Secondary | ICD-10-CM | POA: Diagnosis not present

## 2019-09-18 DIAGNOSIS — Z85810 Personal history of malignant neoplasm of tongue: Secondary | ICD-10-CM | POA: Diagnosis not present

## 2019-09-18 DIAGNOSIS — Z9221 Personal history of antineoplastic chemotherapy: Secondary | ICD-10-CM | POA: Diagnosis not present

## 2019-09-18 DIAGNOSIS — Z79899 Other long term (current) drug therapy: Secondary | ICD-10-CM | POA: Diagnosis not present

## 2019-09-18 DIAGNOSIS — C109 Malignant neoplasm of oropharynx, unspecified: Secondary | ICD-10-CM

## 2019-09-18 DIAGNOSIS — M25561 Pain in right knee: Secondary | ICD-10-CM | POA: Diagnosis not present

## 2019-09-18 DIAGNOSIS — M25562 Pain in left knee: Secondary | ICD-10-CM | POA: Diagnosis not present

## 2019-09-18 LAB — COMPREHENSIVE METABOLIC PANEL
ALT: 28 U/L (ref 0–44)
AST: 26 U/L (ref 15–41)
Albumin: 4 g/dL (ref 3.5–5.0)
Alkaline Phosphatase: 64 U/L (ref 38–126)
Anion gap: 10 (ref 5–15)
BUN: 36 mg/dL — ABNORMAL HIGH (ref 8–23)
CO2: 26 mmol/L (ref 22–32)
Calcium: 9 mg/dL (ref 8.9–10.3)
Chloride: 103 mmol/L (ref 98–111)
Creatinine, Ser: 1.95 mg/dL — ABNORMAL HIGH (ref 0.61–1.24)
GFR calc Af Amer: 40 mL/min — ABNORMAL LOW (ref >60)
GFR calc non Af Amer: 34 mL/min — ABNORMAL LOW (ref >60)
Glucose, Bld: 93 mg/dL (ref 70–99)
Potassium: 4.3 mmol/L (ref 3.5–5.1)
Sodium: 139 mmol/L (ref 135–145)
Total Bilirubin: 0.4 mg/dL (ref 0.3–1.2)
Total Protein: 7.2 g/dL (ref 6.5–8.1)

## 2019-09-18 LAB — CBC WITH DIFFERENTIAL/PLATELET
Abs Immature Granulocytes: 0 10*3/uL (ref 0.00–0.07)
Basophils Absolute: 0 10*3/uL (ref 0.0–0.1)
Basophils Relative: 0 %
Eosinophils Absolute: 0.2 10*3/uL (ref 0.0–0.5)
Eosinophils Relative: 4 %
HCT: 38.7 % — ABNORMAL LOW (ref 39.0–52.0)
Hemoglobin: 12.5 g/dL — ABNORMAL LOW (ref 13.0–17.0)
Immature Granulocytes: 0 %
Lymphocytes Relative: 25 %
Lymphs Abs: 1 10*3/uL (ref 0.7–4.0)
MCH: 30.4 pg (ref 26.0–34.0)
MCHC: 32.3 g/dL (ref 30.0–36.0)
MCV: 94.2 fL (ref 80.0–100.0)
Monocytes Absolute: 0.4 10*3/uL (ref 0.1–1.0)
Monocytes Relative: 10 %
Neutro Abs: 2.5 10*3/uL (ref 1.7–7.7)
Neutrophils Relative %: 61 %
Platelets: 155 10*3/uL (ref 150–400)
RBC: 4.11 MIL/uL — ABNORMAL LOW (ref 4.22–5.81)
RDW: 14.7 % (ref 11.5–15.5)
WBC: 4.1 10*3/uL (ref 4.0–10.5)
nRBC: 0 % (ref 0.0–0.2)

## 2019-09-18 LAB — TSH: TSH: 8.172 u[IU]/mL — ABNORMAL HIGH (ref 0.350–4.500)

## 2019-09-18 MED ORDER — SODIUM CHLORIDE 0.9 % IV SOLN: Freq: Once | INTRAVENOUS | Status: AC

## 2019-09-18 MED ORDER — HYDROCODONE-ACETAMINOPHEN 5-325 MG PO TABS: ORAL_TABLET | ORAL | 0 refills | Status: DC

## 2019-09-18 MED ORDER — HEPARIN SOD (PORK) LOCK FLUSH 100 UNIT/ML IV SOLN
500.0000 [IU] | Freq: Once | INTRAVENOUS | Status: AC | PRN
  Administered 2019-09-18: 500 [IU]

## 2019-09-18 MED ORDER — SODIUM CHLORIDE 0.9% FLUSH
10.0000 mL | INTRAVENOUS | Status: DC | PRN
  Administered 2019-09-18: 10 mL

## 2019-09-18 MED ORDER — LEVOTHYROXINE SODIUM 88 MCG PO TABS: 88.0000 ug | ORAL_TABLET | Freq: Every day | ORAL | 3 refills | Status: DC

## 2019-09-18 MED ORDER — SODIUM CHLORIDE 0.9 % IV SOLN
200.0000 mg | Freq: Once | INTRAVENOUS | Status: AC
  Administered 2019-09-18: 200 mg via INTRAVENOUS
  Filled 2019-09-18: qty 8

## 2019-09-18 NOTE — Progress Notes (Signed)
Labs reviewed with Francene Finders NP today. Will proceed with treatment today as planned.  Treatment given per orders. Patient tolerated it well without problems. Vitals stable and discharged home from clinic ambulatory. Follow up as scheduled.

## 2019-09-18 NOTE — Patient Instructions (Signed)
[place] at [facility] Discharge Instructions  Follow up in 3 weeks with labs    Thank you for choosing [place] at [facility] to provide your oncology and hematology care.  To afford each patient quality time with our provider, please arrive at least 15 minutes before your scheduled appointment time.   If you have a lab appointment with the [facility] please come in thru the Main Entrance and check in at the main information desk.  You need to re-schedule your appointment should you arrive 10 or more minutes late.  We strive to give you quality time with our providers, and arriving late affects you and other patients whose appointments are after yours.  Also, if you no show three or more times for appointments you may be dismissed from the clinic at the providers discretion.     Again, thank you for choosing [facility].  Our hope is that these requests will decrease the amount of time that you wait before being seen by our physicians.       _____________________________________________________________  Should you have questions after your visit to [facility], please contact our office at [phone] between the hours of 8:00 a.m. and 4:30 p.m.  Voicemails left after 4:00 p.m. will not be returned until the following business day.  For prescription refill requests, have your pharmacy contact our office and allow 72 hours.    Due to Covid, you will need to wear a mask upon entering the hospital. If you do not have a mask, a mask will be given to you at the Main Entrance upon arrival. For doctor visits, patients may have 1 support person with them. For treatment visits, patients can not have anyone with them due to social distancing guidelines and our immunocompromised population.

## 2019-09-18 NOTE — Progress Notes (Signed)
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   07/28/2014 Initial Diagnosis   Oropharyngeal cancer   08/03/2014 Imaging   CT CAP- L supraclavicular lymphadenopathy is not completely visualized. This is better seen on the previous neck CT from 07/27/2014. Otherwise, no evidence for metastatic disease in the chest, abdomen, or pelvis.   08/03/2014 Imaging   Bone scan- Uptake at adjacent anterior LEFT 6, 7, 8 ribs likely representing trauma/fractures. Questionable nonspecific increased tracer localization at the posterior RIGHT 8th and 9th ribs, the adjacent nature which raises a a question of trauma as well   08/06/2014 Pathology Results   Alexander [name]- Oropharynx, biopsy, Left - INVASIVE SQUAMOUS CELL CARCINOMA.   08/12/2014 Procedure   Alexander [name]- 1. Multiple extraction of tooth numbers 6, 17, 22, 23, 24, 25, 26, and 27. 3 Quadrants of alveoloplasty   08/17/2014 Pathology Results   PORT and G-TUBE placed by Alexander [name].   08/26/2014 PET scan   Large hypermetabolic mass in the left base of tongue. Activity extends across midline to the right base tongue. 2. Intensely hypermetabolic left cervical metastatic lymph nodes. Lymph nodes extend from the left level II position to the left supraclavi   09/01/2014 - 09/22/2014 Chemotherapy   Concurrent chemoradiation with Cisplatin 100 mg/m2 x 2 cycles with Neulasta support. Held cycle #3 d/t renal toxicity.    09/03/2014 - 10/23/2014 Radiation Therapy   Treated in [place], IMRT Alexander [name]).  Base of tongue  and bilat neck. Total dose: 70 Gy in 35 fractions. (of note, he did miss several treatments requiring BID dosing towards the end of treatment).    01/25/2015 PET scan   Near complete resolution of metabolic activity at the base of tongue. Minimal residual activity is likely post treatment effect. 2. Complete resolution of metabolic activity above LEFT cervical lymph nodes. No evidence of residual metabolically active    3/82/5053 Procedure   Port-a-cath removed Alexander [name])    05/03/2018 - 09/10/2018 Chemotherapy   The patient had palonosetron (ALOXI) injection 0.25 mg, 0.25 mg, Intravenous,  Once, 6 of 6 cycles Administration: 0.25 mg (05/03/2018), 0.25 mg (05/24/2018), 0.25 mg (06/14/2018), 0.25 mg (07/09/2018), 0.25 mg (07/30/2018), 0.25 mg (08/21/2018) pegfilgrastim-cbqv (UDENYCA) injection 6 mg, 6 mg, Subcutaneous, Once, 5 of 5 cycles Administration: 6 mg (05/27/2018), 6 mg (06/17/2018), 6 mg (07/11/2018), 6 mg (08/01/2018), 6 mg (08/23/2018) CARBOplatin (PARAPLATIN) 380 mg in sodium chloride 0.9 % 250 mL chemo infusion, 380 mg (100 % of original dose 381 mg), Intravenous,  Once, 6 of 6 cycles Dose modification:   (original dose 381 mg, Cycle 1),   (original dose 309.5 mg, Cycle 2), 307.5 mg (original dose 309.5 mg, Cycle 5) Administration: 380 mg (05/03/2018), 310 mg (05/24/2018), 310 mg (06/14/2018), 340 mg (07/09/2018), 310 mg (07/30/2018), 350 mg (08/21/2018) PACLitaxel (TAXOL) 330 mg in sodium chloride 0.9 % 500 mL chemo infusion (> 25m/m2), 175 mg/m2 = 330 mg (100 % of original dose 175 mg/m2), Intravenous,  Once, 6 of 6 cycles Dose modification: 175 mg/m2 (original dose 175 mg/m2, Cycle 1, Reason: Patient Age) Administration: 3976  mg (05/03/2018), 330 mg (05/24/2018), 330 mg (06/14/2018), 330 mg (07/09/2018), 330 mg (07/30/2018), 330 mg (08/21/2018)  for chemotherapy treatment.    05/24/2018 -  Chemotherapy   The patient had pembrolizumab (KEYTRUDA) 200 mg in sodium chloride 0.9 % 50 mL chemo infusion, 200 mg,  Intravenous, Once, 23 of 24 cycles Administration: 200 mg (05/24/2018), 200 mg (06/14/2018), 200 mg (07/09/2018), 200 mg (08/21/2018), 200 mg (09/11/2018), 200 mg (10/02/2018), 200 mg (10/23/2018), 200 mg (11/13/2018), 200 mg (12/04/2018), 200 mg (12/25/2018), 200 mg (01/22/2019), 200 mg (02/12/2019), 200 mg (03/05/2019), 200 mg (03/26/2019), 200 mg (04/16/2019), 200 mg (05/07/2019), 200 mg (06/03/2019), 200 mg (06/24/2019), 200 mg (07/17/2019), 200 mg (08/07/2019), 200 mg (08/28/2019)  for chemotherapy treatment.    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 69 y.o. male returns for routine follow-up for squamous cell carcinoma of the lung. Patient is doing well with treatment.  He has no complaints at this time.  He denies any new coughs. Denies any nausea, vomiting, or diarrhea. Denies any new pains. Had not noticed any recent bleeding such as epistaxis, hematuria or hematochezia. Denies recent chest pain on exertion, shortness of breath on minimal exertion, pre-syncopal episodes, or palpitations. Denies any numbness or tingling in hands or feet. Denies any recent fevers, infections, or recent hospitalizations. Patient reports appetite at 75% and energy level at 75%.     REVIEW OF SYSTEMS:  Review of Systems  All other systems reviewed and are negative.    PAST MEDICAL/SURGICAL HISTORY:  Past Medical History:  Diagnosis Date  . GERD (gastroesophageal reflux disease)   . 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 3 weeks ago.- Dr. Oneal [name] center [place], [place].  Marland Kitchen 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[place]) 08/06/2014   SCCa of Left BOT   Past  Surgical History:  Procedure Laterality Date  . BIOPSY  01/15/2018   Procedure: BIOPSY;  Surgeon: Alexander Binder, MD;  [facility];  Service: Endoscopy;;  gastric  . COLONOSCOPY N/A 03/13/2016   Procedure: COLONOSCOPY;  Surgeon: Alexander Binder, MD;  [facility];  Service: Endoscopy;  Laterality: N/A;  2:15 PM  . ESOPHAGOGASTRODUODENOSCOPY (EGD) WITH PROPOFOL N/A 08/17/2014   Procedure: ESOPHAGOGASTRODUODENOSCOPY (EGD) WITH PROPOFOL (procedure #1);  Surgeon: Aviva Signs Md, MD;  Location: [facility];  Service: General;  Laterality: N/A;  . ESOPHAGOGASTRODUODENOSCOPY (EGD) WITH PROPOFOL N/A 01/15/2018   Procedure: ESOPHAGOGASTRODUODENOSCOPY (EGD) WITH PROPOFOL;  Surgeon: Alexander Binder, MD;  [facility];  Service: Endoscopy;  Laterality: N/A;  9:30am  . MULTIPLE EXTRACTIONS WITH ALVEOLOPLASTY N/A 08/12/2014   Procedure: Extraction of tooth #'s 6,17,22,23,24,25,26,27 with alveoloplasty;  Surgeon: Lenn Cal, DDS;  Location: [facility];  Service: Oral Surgery;  Laterality: N/A;  . PANENDOSCOPY N/A 08/06/2014   Procedure: PANENDOSCOPY WITH BIOPSY;  Surgeon: Leta Baptist, MD;  Location: [facility];  Service: ENT;  Laterality: N/A;  . PEG PLACEMENT Left 08/17/14  . PEG PLACEMENT N/A 08/17/2014   Procedure: PERCUTANEOUS ENDOSCOPIC GASTROSTOMY (PEG) PLACEMENT (procedure #1);  Surgeon: Aviva Signs Md, MD;  Location: [facility];  Service: General;  Laterality: N/A;  .  PORT-A-CATH REMOVAL Right 07/17/2016   Procedure: MINOR REMOVAL PORT-A-CATH;  Surgeon: Aviva Signs, MD;  Location[facility];  Service: General;  Laterality: Right;  . PORTACATH PLACEMENT Right 08/17/14  . PORTACATH PLACEMENT Right 08/17/2014   Procedure: INSERTION PORT-A-CATH (procedure #2);  Surgeon: Aviva Signs Md, MD;  Location[facility];  Service: General;  Laterality: Right;  . PORTACATH PLACEMENT Left 04/26/2018   Procedure: INSERTION PORT-A-CATH (attached catheter in left subclavian);  Surgeon:  Aviva Signs, MD;  Location[facility];  Service: General;  Laterality: Left;  . SAVORY DILATION N/A 01/15/2018   Procedure: SAVORY DILATION;  Surgeon: Alexander Binder, MD;  [facility];  Service: Endoscopy;  Laterality: N/A;  . VIDEO BRONCHOSCOPY WITH ENDOBRONCHIAL ULTRASOUND N/A 04/15/2018   Procedure: VIDEO BRONCHOSCOPY WITH ENDOBRONCHIAL ULTRASOUND;  Surgeon: Melrose Nakayama, MD;  Location: [facility];  Service: Thoracic;  Laterality: N/A;     SOCIAL HISTORY:  Social History   Socioeconomic History  . Marital status: Legally Separated    Spouse name: Not on file  . Number of children: 5  . Years of education: Not on file  . Highest education level: Not on file  Occupational History  . Not on file  Tobacco Use  . Smoking status: Former Smoker    Packs/day: 0.50    Years: 30.00    Pack years: 15.00    Quit date: 07/22/2014    Years since quitting: 5.1  . Smokeless tobacco: Never Used  Substance and Sexual Activity  . Alcohol use: Not Currently    Alcohol/week: 0.0 standard drinks    Comment: None currently (11/09/17); previously 1-2 beers on the weekend  . Drug use: No  . Sexual activity: Not on file  Other Topics Concern  . Not on file  Social History Narrative  . Not on file   Social Determinants of Health   Financial Resource Strain:   . Difficulty of Paying Living Expenses:   Food Insecurity:   . Worried About Charity fundraiser in the Last Year:   . Arboriculturist in the Last Year:   Transportation Needs:   . Film/video editor (Medical):   Marland Kitchen Lack of Transportation (Non-Medical):   Physical Activity:   . Days of Exercise per Week:   . Minutes of Exercise per Session:   Stress:   . Feeling of Stress :   Social Connections:   . Frequency of Communication with Friends and Family:   . Frequency of Social Gatherings with Friends and Family:   . Attends Religious Services:   . Active Member of Clubs or Organizations:   . Attends Theatre manager Meetings:   Marland Kitchen Marital Status:   Intimate Partner Violence:   . Fear of Current or Ex-Partner:   . Emotionally Abused:   Marland Kitchen Physically Abused:   . Sexually Abused:     FAMILY HISTORY:  Family History  Problem Relation Age of Onset  . Colon cancer Neg Hx   . Gastric cancer Neg Hx   . Esophageal cancer Neg Hx     CURRENT MEDICATIONS:  Outpatient Encounter Medications as of 09/18/2019  Medication Sig  . feeding supplement, ENSURE ENLIVE, (ENSURE ENLIVE) LIQD Take 237 mLs by mouth 4 (four) times daily.   Marland Kitchen levothyroxine (SYNTHROID) 75 MCG tablet TAKE 1 TABLET DAILY BEFORE BREAKFAST.  . Melatonin 10 MG TABS Take 1 tablet by mouth at bedtime.  Marland Kitchen omeprazole (PRILOSEC) 20 MG capsule TAKE 1 CAPSULE BY MOUTH  [place]  . Pembrolizumab (KEYTRUDA IV) Inject into the vein every 21 ( twenty-one) days.  Marland Kitchen levothyroxine (SYNTHROID) 88 MCG tablet Take 1 tablet (88 mcg total) by mouth daily before breakfast.  . naproxen sodium (ALEVE) 220 MG tablet Take 220 mg by mouth daily as needed. (Patient not taking: Reported on 09/18/2019)  . [DISCONTINUED] HYDROcodone-acetaminophen (NORCO/VICODIN) 5-325 MG tablet TAKE 1 TABLET EVERY 12 HOURS AS NEEDED FOR MODERATE PAIN (Patient not taking: Reported on 09/18/2019)  . [DISCONTINUED] prochlorperazine (COMPAZINE) 10 MG tablet Take 1 tablet (10 mg total) by mouth every 6 (six) hours as needed (Nausea or vomiting). (Patient not taking: Reported on 11/13/2018)   Facility-Administered Encounter Medications as of 09/18/2019  Medication  . sodium chloride flush (NS) 0.9 % injection 10 mL    ALLERGIES:  No Known Allergies   PHYSICAL EXAM:  ECOG Performance status: 1  Vitals:   09/18/19 1145  BP: (!) 101/57  Pulse: 60  Resp: 18  Temp: 97.8 F (36.6 C)  SpO2: 94%   Filed Weights   09/18/19 1145  Weight: 140 lb 3.2 oz (63.6 kg)   Physical Exam Constitutional:      Appearance: Normal appearance. He is normal weight.    Cardiovascular:     Rate and Rhythm: Normal rate and regular rhythm.     Heart sounds: Normal heart sounds.  Pulmonary:     Effort: Pulmonary effort is normal.     Breath sounds: Normal breath sounds.  Abdominal:     General: Bowel sounds are normal.     Palpations: Abdomen is soft.  Musculoskeletal:        General: Normal range of motion.  Skin:    General: Skin is warm.  Neurological:     Mental Status: He is alert and oriented to person, place, and time. Mental status is at baseline.  Psychiatric:        Mood and Affect: Mood normal.        Behavior: Behavior normal.        Thought Content: Thought content normal.        Judgment: Judgment normal.      LABORATORY DATA:  I have reviewed the labs as listed.  CBC    Component Value Date/Time   WBC 4.1 09/18/2019 1154   RBC 4.11 (L) 09/18/2019 1154   HGB 12.5 (L) 09/18/2019 1154   HCT 38.7 (L) 09/18/2019 1154   PLT 155 09/18/2019 1154   MCV 94.2 09/18/2019 1154   MCH 30.4 09/18/2019 1154   MCHC 32.3 09/18/2019 1154   RDW 14.7 09/18/2019 1154   LYMPHSABS 1.0 09/18/2019 1154   MONOABS 0.4 09/18/2019 1154   EOSABS 0.2 09/18/2019 1154   BASOSABS 0.0 09/18/2019 1154   CMP Latest Ref Rng & Units 09/18/2019 08/28/2019 08/07/2019  Glucose 70 - 99 mg/dL 93 96 120(H)  BUN 8 - 23 mg/dL 36(H) 32(H) 28(H)  Creatinine 0.61 - 1.24 mg/dL 1.95(H) 1.96(H) 1.82(H)  Sodium 135 - 145 mmol/L 139 141 140  Potassium 3.5 - 5.1 mmol/L 4.3 4.5 4.4  Chloride 98 - 111 mmol/L 103 105 102  CO2 22 - 32 mmol/L _0 Calcium 8.9 - 10.3 mg/dL 9.0 8.8(L) 8.9  Total Protein 6.5 - 8.1 g/dL 7.2 7.2 6.8  Total Bilirubin 0.3 - 1.2 mg/dL 0.4 0.6 0.5  Alkaline Phos 38 - 126 U/L 64 51 58  AST 15 - 41 U/L _1 ALT 0 - 44 U/L 28  24 28    DIAGNOSTIC IMAGING:  I have independently reviewed the PET scans and discussed with the patient.  ASSESSMENT & PLAN:  Squamous cell lung cancer, left ([place]) 1.  Advanced squamous cell carcinoma of the left  lung: -PD-L1 not done, foundation 1 MS [name], no other targetable mutations. -6 cycles of carboplatin, paclitaxel and pembrolizumab from 05/03/2018 through 08/21/2018. -Maintenance pembrolizumab started on 09/11/2018. -PET scan on 06/09/2019 showed a slight increase in hypermetabolic some in the floor of the mouth and lymph nodes.  One of the lymph nodes has grown it by few millimeters. -As this was not significant progression we have continued pembrolizumab. -Labs done on 09/18/2018 have been reviewed and he will continue with his dose of pembrolizumab today. -PET scan done on 09/15/2019 showed interval decrease in hypermetabolic zone associated with the tongue and floor of mouth.  Hypermetabolic metastatic lymphadenopathy in the chest is similar to prior.  No definite new sites of hypermetabolic disease identified on the current exam. -He will follow-up with his next dose.  2.  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 -We will continue to monitor subsequent PET scans.  3.  Hypothyroidism: -TSH was 2.3 at his last visit. -Labs done on 09/18/2019 showed his TSH has increased to 8.172 -We will increase his dose of Synthroid from 75 mcg to 88 mcg. -We will recheck on his next visit.  4.  Bilateral knee pains: -Well controlled with hydrocodone -I will send a refill today.     Orders placed this encounter:  Orders Placed This Encounter  Procedures  . Lactate dehydrogenase  . Magnesium  . CBC with Differential/Platelet  . Comprehensive metabolic panel  . TSH      Francene Finders, FNP-C [employer] [phone]

## 2019-09-18 NOTE — Patient Instructions (Signed)
[facility] Discharge Instructions for Patients Receiving Chemotherapy  Today you received the following chemotherapy agents   To help prevent nausea and vomiting after your treatment, we encourage you to take your nausea medication   If you develop nausea and vomiting that is not controlled by your nausea medication, call the clinic.   BELOW ARE SYMPTOMS THAT SHOULD BE REPORTED IMMEDIATELY:  *FEVER GREATER THAN 100.5 F  *CHILLS WITH OR WITHOUT FEVER  NAUSEA AND VOMITING THAT IS NOT CONTROLLED WITH YOUR NAUSEA MEDICATION  *UNUSUAL SHORTNESS OF BREATH  *UNUSUAL BRUISING OR BLEEDING  TENDERNESS IN MOUTH AND THROAT WITH OR WITHOUT PRESENCE OF ULCERS  *URINARY PROBLEMS  *BOWEL PROBLEMS  UNUSUAL RASH Items with * indicate a potential emergency and should be followed up as soon as possible.  Feel free to call the clinic should you have any questions or concerns. The clinic phone number is [phone].  Please show the CHEMO ALERT CARD at check-in to the Emergency Department and triage nurse.   

## 2019-09-18 NOTE — Assessment & Plan Note (Signed)
1.  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 -6 cycles of carboplatin, paclitaxel and pembrolizumab from 05/03/2018 through 08/21/2018. -Maintenance pembrolizumab started on 09/11/2018. -PET scan on 06/09/2019 showed a slight increase in hypermetabolic some in the floor of the mouth and lymph nodes.  One of the lymph nodes has grown it by few millimeters. -As this was not significant progression we have continued pembrolizumab. -Labs done on 09/18/2018 have been reviewed and he will continue with his dose of pembrolizumab today. -PET scan done on 09/15/2019 showed interval decrease in hypermetabolic zone associated with the tongue and floor of mouth.  Hypermetabolic metastatic lymphadenopathy in the chest is similar to prior.  No definite new sites of hypermetabolic disease identified on the current exam. -He will follow-up with his next dose.  2.  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 -We will continue to monitor subsequent PET scans.  3.  Hypothyroidism: -TSH was 2.3 at his last visit. -Labs done on 09/18/2019 showed his TSH has increased to 8.172 -We will increase his dose of Synthroid from 75 mcg to 88 mcg. -We will recheck on his next visit.  4.  Bilateral knee pains: -Well controlled with hydrocodone -I will send a refill today.

## 2019-10-09 ENCOUNTER — Inpatient Hospital Stay (HOSPITAL_COMMUNITY): Payer: Medicare HMO | Attending: Hematology

## 2019-10-09 ENCOUNTER — Other Ambulatory Visit (HOSPITAL_COMMUNITY): Payer: Self-pay | Admitting: *Deleted

## 2019-10-09 ENCOUNTER — Inpatient Hospital Stay (HOSPITAL_COMMUNITY): Payer: Medicare HMO

## 2019-10-09 ENCOUNTER — Encounter (HOSPITAL_COMMUNITY): Payer: Self-pay | Admitting: Hematology

## 2019-10-09 ENCOUNTER — Inpatient Hospital Stay (HOSPITAL_BASED_OUTPATIENT_CLINIC_OR_DEPARTMENT_OTHER): Payer: Medicare HMO | Admitting: Hematology

## 2019-10-09 ENCOUNTER — Other Ambulatory Visit: Payer: Self-pay

## 2019-10-09 VITALS — BP 96/56 | HR 61 | Temp 98.0°F | Resp 18 | Wt 136.0 lb

## 2019-10-09 VITALS — BP 130/66 | HR 50 | Temp 97.8°F | Resp 18

## 2019-10-09 DIAGNOSIS — C3492 Malignant neoplasm of unspecified part of left bronchus or lung: Secondary | ICD-10-CM

## 2019-10-09 DIAGNOSIS — M25561 Pain in right knee: Secondary | ICD-10-CM | POA: Diagnosis not present

## 2019-10-09 DIAGNOSIS — Z5112 Encounter for antineoplastic immunotherapy: Secondary | ICD-10-CM | POA: Insufficient documentation

## 2019-10-09 DIAGNOSIS — Z87891 Personal history of nicotine dependence: Secondary | ICD-10-CM | POA: Diagnosis not present

## 2019-10-09 DIAGNOSIS — M25562 Pain in left knee: Secondary | ICD-10-CM | POA: Insufficient documentation

## 2019-10-09 DIAGNOSIS — E039 Hypothyroidism, unspecified: Secondary | ICD-10-CM | POA: Diagnosis not present

## 2019-10-09 DIAGNOSIS — Z9221 Personal history of antineoplastic chemotherapy: Secondary | ICD-10-CM | POA: Insufficient documentation

## 2019-10-09 DIAGNOSIS — Z79899 Other long term (current) drug therapy: Secondary | ICD-10-CM | POA: Insufficient documentation

## 2019-10-09 DIAGNOSIS — Z85810 Personal history of malignant neoplasm of tongue: Secondary | ICD-10-CM | POA: Insufficient documentation

## 2019-10-09 DIAGNOSIS — C109 Malignant neoplasm of oropharynx, unspecified: Secondary | ICD-10-CM

## 2019-10-09 DIAGNOSIS — Z923 Personal history of irradiation: Secondary | ICD-10-CM | POA: Insufficient documentation

## 2019-10-09 LAB — CBC WITH DIFFERENTIAL/PLATELET
Abs Immature Granulocytes: 0 10*3/uL (ref 0.00–0.07)
Basophils Absolute: 0 10*3/uL (ref 0.0–0.1)
Basophils Relative: 1 %
Eosinophils Absolute: 0.1 10*3/uL (ref 0.0–0.5)
Eosinophils Relative: 3 %
HCT: 40.3 % (ref 39.0–52.0)
Hemoglobin: 13 g/dL (ref 13.0–17.0)
Immature Granulocytes: 0 %
Lymphocytes Relative: 32 %
Lymphs Abs: 1.1 10*3/uL (ref 0.7–4.0)
MCH: 30.3 pg (ref 26.0–34.0)
MCHC: 32.3 g/dL (ref 30.0–36.0)
MCV: 93.9 fL (ref 80.0–100.0)
Monocytes Absolute: 0.3 10*3/uL (ref 0.1–1.0)
Monocytes Relative: 10 %
Neutro Abs: 1.9 10*3/uL (ref 1.7–7.7)
Neutrophils Relative %: 54 %
Platelets: 170 10*3/uL (ref 150–400)
RBC: 4.29 MIL/uL (ref 4.22–5.81)
RDW: 14.6 % (ref 11.5–15.5)
WBC: 3.4 10*3/uL — ABNORMAL LOW (ref 4.0–10.5)
nRBC: 0 % (ref 0.0–0.2)

## 2019-10-09 LAB — COMPREHENSIVE METABOLIC PANEL
ALT: 15 U/L (ref 0–44)
AST: 18 U/L (ref 15–41)
Albumin: 4.1 g/dL (ref 3.5–5.0)
Alkaline Phosphatase: 48 U/L (ref 38–126)
Anion gap: 8 (ref 5–15)
BUN: 27 mg/dL — ABNORMAL HIGH (ref 8–23)
CO2: 26 mmol/L (ref 22–32)
Calcium: 8.8 mg/dL — ABNORMAL LOW (ref 8.9–10.3)
Chloride: 104 mmol/L (ref 98–111)
Creatinine, Ser: 1.88 mg/dL — ABNORMAL HIGH (ref 0.61–1.24)
GFR calc Af Amer: 41 mL/min — ABNORMAL LOW (ref >60)
GFR calc non Af Amer: 36 mL/min — ABNORMAL LOW (ref >60)
Glucose, Bld: 89 mg/dL (ref 70–99)
Potassium: 4.3 mmol/L (ref 3.5–5.1)
Sodium: 138 mmol/L (ref 135–145)
Total Bilirubin: 0.6 mg/dL (ref 0.3–1.2)
Total Protein: 7 g/dL (ref 6.5–8.1)

## 2019-10-09 LAB — MAGNESIUM: Magnesium: 2 mg/dL (ref 1.7–2.4)

## 2019-10-09 LAB — TSH: TSH: 7.375 u[IU]/mL — ABNORMAL HIGH (ref 0.350–4.500)

## 2019-10-09 LAB — LACTATE DEHYDROGENASE: LDH: 103 U/L (ref 98–192)

## 2019-10-09 MED ORDER — SODIUM CHLORIDE 0.9 % IV SOLN
200.0000 mg | Freq: Once | INTRAVENOUS | Status: AC
  Administered 2019-10-09: 200 mg via INTRAVENOUS
  Filled 2019-10-09: qty 8

## 2019-10-09 MED ORDER — SODIUM CHLORIDE 0.9 % IV SOLN: Freq: Once | INTRAVENOUS | Status: AC

## 2019-10-09 MED ORDER — SODIUM CHLORIDE 0.9% FLUSH
10.0000 mL | INTRAVENOUS | Status: DC | PRN
  Administered 2019-10-09: 10 mL

## 2019-10-09 MED ORDER — HEPARIN SOD (PORK) LOCK FLUSH 100 UNIT/ML IV SOLN
500.0000 [IU] | Freq: Once | INTRAVENOUS | Status: AC | PRN
  Administered 2019-10-09: 500 [IU]

## 2019-10-09 NOTE — Patient Instructions (Signed)
[place] at [facility] Discharge Instructions  You were seen today by Dr. Delton Coombes. He went over your recent results and scans. You received your treatment today; your next treatment will be in 3 weeks. Dr. Delton Coombes will see you back in 6 weeks for labs and follow up.   Thank you for choosing [place] at [facility] to provide your oncology and hematology care.  To afford each patient quality time with our provider, please arrive at least 15 minutes before your scheduled appointment time.   If you have a lab appointment with the [facility] please come in thru the Main Entrance and check in at the main information desk  You need to re-schedule your appointment should you arrive 10 or more minutes late.  We strive to give you quality time with our providers, and arriving late affects you and other patients whose appointments are after yours.  Also, if you no show three or more times for appointments you may be dismissed from the clinic at the providers discretion.     Again, thank you for choosing [facility].  Our hope is that these requests will decrease the amount of time that you wait before being seen by our physicians.       _____________________________________________________________  Should you have questions after your visit to [facility], please contact our office at [phone] between the hours of 8:00 a.m. and 4:30 p.m.  Voicemails left after 4:00 p.m. will not be returned until the following business day.  For prescription refill requests, have your pharmacy contact our office and allow 72 hours.    Cancer Center Support Programs:   > Cancer Support Group  2nd Tuesday of the month 1pm-2pm, Journey Room

## 2019-10-09 NOTE — Progress Notes (Signed)
Ok to treat verbal order Dr. Delton Coombes.    Patient tolerated chemotherapy with no complaints voiced.  Side effects with management reviewed with understanding verbalized.  Port site clean and dry with no bruising or swelling noted at site.  Good blood return noted before and after administration of chemotherapy.  Band aid applied.  Patient left in satisfactory condition with VSS and no s/s of distress noted.

## 2019-10-09 NOTE — Progress Notes (Signed)
Alexander [name], Alexander [name]   CLINIC:  Medical Oncology/Hematology  PCP:  Lemmie Evens, MD [place]. 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   07/28/2014 Initial Diagnosis   Oropharyngeal cancer   08/03/2014 Imaging   CT CAP- L supraclavicular lymphadenopathy is not completely visualized. This is better seen on the previous neck CT from 07/27/2014. Otherwise, no evidence for metastatic disease in the chest, abdomen, or pelvis.   08/03/2014 Imaging   Bone scan- Uptake at adjacent anterior LEFT 6, 7, 8 ribs likely representing trauma/fractures. Questionable nonspecific increased tracer localization at the posterior RIGHT 8th and 9th ribs, the adjacent nature which raises a a question of trauma as well   08/06/2014 Pathology Results   Dr. Benjamine Mola- Oropharynx, biopsy, Left - INVASIVE SQUAMOUS CELL CARCINOMA.   08/12/2014 Procedure   Dr. Enrique Sack- 1. Multiple extraction of tooth numbers 6, 17, 22, 23, 24, 25, 26, and 27. 3 Quadrants of alveoloplasty   08/17/2014 Pathology Results   PORT and G-TUBE placed by Dr. Carlis Stable.   08/26/2014 PET scan   Large hypermetabolic mass in the left base of tongue. Activity extends across midline to the right base tongue. 2. Intensely hypermetabolic left cervical metastatic lymph nodes. Lymph nodes extend from the left level II position to the left supraclavi   09/01/2014 - 09/22/2014 Chemotherapy   Concurrent chemoradiation with Cisplatin 100 mg/m2 x 2 cycles with Neulasta support. Held cycle  #3 d/t renal toxicity.    09/03/2014 - 10/23/2014 Radiation Therapy   Treated in [place], IMRT Isidore Moos).  Base of tongue and bilat neck. Total dose: 70 Gy in 35 fractions. (of note, he did miss several treatments requiring BID dosing towards the end of treatment).    01/25/2015 PET scan   Near complete resolution of metabolic activity at the base of tongue. Minimal residual activity is likely post treatment effect. 2. Complete resolution of metabolic activity above LEFT cervical lymph nodes. No evidence of residual metabolically active    2/99/3716 Procedure   Port-a-cath removed [name])    05/03/2018 - 09/10/2018 Chemotherapy   The patient had palonosetron (ALOXI) injection 0.25 mg, 0.25 mg, Intravenous,  Once, 6 of 6 cycles Administration: 0.25 mg (05/03/2018), 0.25 mg (05/24/2018), 0.25 mg (06/14/2018), 0.25 mg (07/09/2018), 0.25 mg (07/30/2018), 0.25 mg (08/21/2018) pegfilgrastim-cbqv (UDENYCA) injection 6 mg, 6 mg, Subcutaneous, Once, 5 of 5 cycles Administration: 6 mg (05/27/2018), 6 mg (06/17/2018), 6 mg (07/11/2018), 6 mg (08/01/2018), 6 mg (08/23/2018) CARBOplatin (PARAPLATIN) 380 mg in sodium chloride 0.9 % 250 mL chemo infusion, 380 mg (100 % of original dose 381 mg), Intravenous,  Once, 6 of 6 cycles Dose modification:   (original dose 381 mg, Cycle 1),   (original dose 309.5 mg, Cycle 2), 307.5 mg (original dose 309.5 mg, Cycle 5) Administration: 380 mg (05/03/2018), 310 mg (05/24/2018), 310 mg (06/14/2018), 340 mg (07/09/2018), 310 mg (07/30/2018), 350 mg (08/21/2018) PACLitaxel (TAXOL) 330 mg in sodium chloride 0.9 % 500 mL chemo infusion (> 40m/m2), 175 mg/m2 = 330 mg (100 % of original dose 175 mg/m2), Intravenous,  Once, 6  of 6 cycles Dose modification: 175 mg/m2 (original dose 175 mg/m2, Cycle 1, Reason: Patient Age) Administration: 330 mg (05/03/2018), 330 mg (05/24/2018), 330 mg (06/14/2018), 330 mg (07/09/2018), 330 mg (07/30/2018), 330 mg (08/21/2018)  for chemotherapy treatment.    05/24/2018 -   Chemotherapy   The patient had pembrolizumab (KEYTRUDA) 200 mg in sodium chloride 0.9 % 50 mL chemo infusion, 200 mg, Intravenous, Once, 23 of 24 cycles Administration: 200 mg (05/24/2018), 200 mg (06/14/2018), 200 mg (07/09/2018), 200 mg (08/21/2018), 200 mg (09/11/2018), 200 mg (10/02/2018), 200 mg (10/23/2018), 200 mg (11/13/2018), 200 mg (12/04/2018), 200 mg (12/25/2018), 200 mg (01/22/2019), 200 mg (02/12/2019), 200 mg (03/05/2019), 200 mg (03/26/2019), 200 mg (04/16/2019), 200 mg (05/07/2019), 200 mg (06/03/2019), 200 mg (06/24/2019), 200 mg (07/17/2019), 200 mg (08/07/2019), 200 mg (08/28/2019), 200 mg (09/18/2019)  for chemotherapy treatment.    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 male, returns for routine follow-up and consideration for next cycle of chemotherapy. Alexander [name] was last seen on 08/28/2019.  Due for cycle #24 of Keytruda today.   Overall, today he tells me he has been feeling pretty well. He is tolerating the injections well, and denies N/V/D, rashes, or SOB. His appetite is good and he is eating 3 meals per day. He denies any numbness/tingling and denies abdominal pain.  Overall, he feels ready for next cycle of chemo today.    REVIEW OF SYSTEMS:  Review of Systems  Constitutional: Positive for appetite change (severely decreased) and fatigue (mild).  Respiratory: Negative for shortness of breath.   Gastrointestinal: Negative for abdominal pain, diarrhea, nausea and vomiting.  Musculoskeletal: Positive for arthralgias (8/10 knee pain).  Skin: Negative for rash.  All other systems reviewed and are negative.   PAST MEDICAL/SURGICAL HISTORY:  Past Medical History:  Diagnosis Date  . GERD  (gastroesophageal reflux disease)   . 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 Oropharyngeal cancer ([place]) 07/28/2014   dx. 3 weeks ago.- Dr. Oneal [facility] [place], [place].  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 BIOPSY  01/15/2018   Procedure: BIOPSY;  Surgeon: Danie Binder, MD;  [facility];  Service: Endoscopy;;  gastric  . COLONOSCOPY N/A 03/13/2016   Procedure: COLONOSCOPY;  Surgeon: Danie Binder, MD;  [facility];  Service: Endoscopy;  Laterality: N/A;  2:15 PM  . ESOPHAGOGASTRODUODENOSCOPY (EGD) WITH PROPOFOL N/A 08/17/2014   Procedure: ESOPHAGOGASTRODUODENOSCOPY (EGD) WITH PROPOFOL (procedure #1);  Surgeon: Aviva Signs Md, MD;  Location: [facility];  Service: General;  Laterality: N/A;  . ESOPHAGOGASTRODUODENOSCOPY (EGD) WITH PROPOFOL N/A 01/15/2018   Procedure: ESOPHAGOGASTRODUODENOSCOPY (EGD) WITH PROPOFOL;  Surgeon: Danie Binder, MD;  [facility];  Service: Endoscopy;  Laterality: N/A;  9:30am  . MULTIPLE EXTRACTIONS WITH ALVEOLOPLASTY N/A 08/12/2014   Procedure: Extraction of tooth #'s 6,17,22,23,24,25,26,27 with alveoloplasty;  Surgeon: Lenn Cal, DDS;  Location: [facility];  Service: Oral Surgery;  Laterality: N/A;  . PANENDOSCOPY N/A 08/06/2014   Procedure: PANENDOSCOPY WITH BIOPSY;  Surgeon: Leta Baptist, MD;  Location:  [facility];  Service: ENT;  Laterality: N/A;  . PEG PLACEMENT Left 08/17/14  . PEG PLACEMENT N/A 08/17/2014   Procedure: PERCUTANEOUS ENDOSCOPIC GASTROSTOMY (PEG) PLACEMENT (procedure #1);  Surgeon: Aviva Signs Md, MD;  Location: [facility];  Service: General;  Laterality: N/A;  . PORT-A-CATH REMOVAL Right 07/17/2016   Procedure: MINOR REMOVAL PORT-A-CATH;  Surgeon: Aviva Signs, MD;  Location: [facility];  Service: General;  Laterality: Right;  . PORTACATH PLACEMENT Right  08/17/14  . PORTACATH PLACEMENT Right 08/17/2014   Procedure: INSERTION PORT-A-CATH (procedure #2);  Surgeon: Aviva Signs Md, MD;  Location[facility];  Service: General;  Laterality: Right;  . PORTACATH PLACEMENT Left 04/26/2018   Procedure: INSERTION PORT-A-CATH (attached catheter in left subclavian);  Surgeon: Aviva Signs, MD;  Location[facility];  Service: General;  Laterality: Left;  . SAVORY DILATION N/A 01/15/2018   Procedure: SAVORY DILATION;  Surgeon: Danie Binder, MD;  [facility];  Service: Endoscopy;  Laterality: N/A;  . VIDEO BRONCHOSCOPY WITH ENDOBRONCHIAL ULTRASOUND N/A 04/15/2018   Procedure: VIDEO BRONCHOSCOPY WITH ENDOBRONCHIAL ULTRASOUND;  Surgeon: Melrose Nakayama, MD;  Location: [facility];  Service: Thoracic;  Laterality: N/A;    SOCIAL HISTORY:  Social History   Socioeconomic History  . Marital status: Legally Separated    Spouse name: Not on file  . Number of children: 5  . Years of education: Not on file  . Highest education level: Not on file  Occupational History  . Not on file  Tobacco Use  . Smoking status: Former Smoker    Packs/day: 0.50    Years: 30.00    Pack years: 15.00    Quit date: 07/22/2014    Years since quitting: 5.2  . Smokeless tobacco: Never Used  Substance and Sexual Activity  . Alcohol use: Not Currently    Alcohol/week: 0.0 standard drinks    Comment: None currently (11/09/17); previously 1-2 beers on the weekend  . Drug use: No  . Sexual activity: Not on file  Other Topics Concern  . Not on file  Social History Narrative  . Not on file   Social Determinants of Health   Financial Resource Strain:   . Difficulty of Paying Living Expenses:   Food Insecurity:   . Worried About Charity fundraiser in the Last Year:   . Arboriculturist in the Last Year:   Transportation Needs:   . Film/video editor (Medical):   Marland Kitchen Lack of Transportation (Non-Medical):   Physical Activity:   . Days of Exercise per Week:   .  Minutes of Exercise per Session:   Stress:   . Feeling of Stress :   Social Connections:   . Frequency of Communication with Friends and Family:   . Frequency of Social Gatherings with Friends and Family:   . Attends Religious Services:   . Active Member of Clubs or Organizations:   . Attends Archivist Meetings:   Marland Kitchen Marital Status:   Intimate Partner Violence:   . Fear of Current or Ex-Partner:   . Emotionally Abused:   Marland Kitchen Physically Abused:   . Sexually Abused:     FAMILY HISTORY:  Family History  Problem Relation Age of Onset  . Colon cancer Neg Hx   . Gastric cancer Neg Hx   . Esophageal cancer Neg Hx     CURRENT MEDICATIONS:  Current Outpatient Medications  Medication Sig Dispense Refill  . feeding supplement, ENSURE ENLIVE, (ENSURE ENLIVE) LIQD  Take 237 mLs by mouth 4 (four) times daily.     Marland Kitchen levothyroxine (SYNTHROID) 75 MCG tablet TAKE 1 TABLET DAILY BEFORE BREAKFAST. 30 tablet 0  . levothyroxine (SYNTHROID) 88 MCG tablet Take 1 tablet (88 mcg total) by mouth daily before breakfast. 30 tablet 3  . Melatonin 10 MG TABS Take 1 tablet by mouth at bedtime.    Marland Kitchen omeprazole (PRILOSEC) 20 MG capsule TAKE 1 CAPSULE BY MOUTH 30 MINUTES PRIOR TO BREAKFAST 90 capsule 3  . Pembrolizumab (KEYTRUDA IV) Inject into the vein every 21 ( twenty-one) days.    Marland Kitchen HYDROcodone-acetaminophen (NORCO/VICODIN) 5-325 MG tablet TAKE 1 TABLET EVERY 12 HOURS AS NEEDED FOR MODERATE PAIN (Patient not taking: Reported on 10/09/2019) 30 tablet 0  . naproxen sodium (ALEVE) 220 MG tablet Take 220 mg by mouth daily as needed. (Patient not taking: Reported on 09/18/2019)     No current facility-administered medications for this visit.   Facility-Administered Medications Ordered in Other Visits  Medication Dose Route Frequency Provider Last Rate Last Admin  . sodium chloride flush (NS) 0.9 % injection 10 mL  10 mL Intracatheter PRN Derek Jack, MD   10 mL at 06/24/19 0925    ALLERGIES:    No Known Allergies  PHYSICAL EXAM:  Performance status (ECOG): 1 - Symptomatic but completely ambulatory  Vitals:   10/09/19 1216  BP: (!) 96/56  Pulse: 61  Resp: 18  Temp: 98 F (36.7 C)  SpO2: 99%   Wt Readings from Last 3 Encounters:  10/09/19 136 lb (61.7 kg)  09/18/19 140 lb 3.2 oz (63.6 kg)  08/28/19 139 lb 1.6 oz (63.1 kg)   Physical Exam Vitals reviewed.  Constitutional:      Appearance: Normal appearance.  Cardiovascular:     Rate and Rhythm: Normal rate and regular rhythm.     Pulses: Normal pulses.     Heart sounds: Normal heart sounds.  Pulmonary:     Effort: Pulmonary effort is normal.     Breath sounds: Normal breath sounds.  Chest:     Comments: Port on L chest Abdominal:     Palpations: Abdomen is soft. There is no mass.     Tenderness: There is no abdominal tenderness.     Hernia: No hernia is present.  Musculoskeletal:     Right lower leg: No edema.     Left lower leg: No edema.  Neurological:     General: No focal deficit present.     Mental Status: He is alert and oriented to person, place, and time.  Psychiatric:        Mood and Affect: Mood normal.        Behavior: Behavior normal.     LABORATORY DATA:  I have reviewed the labs as listed.  CBC Latest Ref Rng & Units 10/09/2019 09/18/2019 08/28/2019  WBC 4.0 - 10.5 K/uL 3.4(L) 4.1 4.1  Hemoglobin 13.0 - 17.0 g/dL 13.0 12.5(L) 13.1  Hematocrit 39 - 52 % 40.3 38.7(L) 41.0  Platelets 150 - 400 K/uL 170 155 160   CMP Latest Ref Rng & Units 10/09/2019 09/18/2019 08/28/2019  Glucose 70 - 99 mg/dL 89 93 96  BUN 8 - 23 mg/dL 27(H) 36(H) 32(H)  Creatinine 0.61 - 1.24 mg/dL 1.88(H) 1.95(H) 1.96(H)  Sodium 135 - 145 mmol/L 138 139 141  Potassium 3.5 - 5.1 mmol/L 4.3 4.3 4.5  Chloride 98 - 111 mmol/L 104 103 105  CO2 22 - 32 mmol/L 26 26 27   Calcium 8.9 -  10.3 mg/dL 8.8(L) 9.0 8.8(L)  Total Protein 6.5 - 8.1 g/dL 7.0 7.2 7.2  Total Bilirubin 0.3 - 1.2 mg/dL 0.6 0.4 0.6  Alkaline Phos 38 - 126 U/L  48 64 51  AST 15 - 41 U/L 18 26 24   ALT 0 - 44 U/L 15 28 24    Lab Results  Component Value Date   LDH 103 10/09/2019   LDH 127 06/14/2018   LDH 107 05/24/2018    DIAGNOSTIC IMAGING:  I have independently reviewed the scans and discussed with the patient. NM PET Image Restag (PS) Skull Base To Thigh  Result Date: 09/16/2019 CLINICAL DATA:  Subsequent treatment strategy for head and neck cancer. EXAM: NUCLEAR MEDICINE PET SKULL BASE TO THIGH TECHNIQUE: 8.2 mCi F-18 FDG was injected intravenously. Full-ring PET imaging was performed from the skull base to thigh after the radiotracer. CT data was obtained and used for attenuation correction and anatomic localization. Fasting blood glucose: 86 mg/dl COMPARISON:  06/09/2019 FINDINGS: Mediastinal blood pool activity: SUV max 2.2 Liver activity: SUV max NA NECK: The diffuse FDG accumulation identified previously in the tongue/floor mouth is persistent with SUV max = 10.9 today compared to 25.4 previously. No hypermetabolic cervical lymphadenopathy. Incidental CT findings: None. CHEST: Hypermetabolic lymph nodes are seen in the supraclavicular regions, mediastinum, and left hilum. Index right paratracheal node measured previously at 2.1 cm is 2.2 cm short axis today (138/3) SUV max = 8.3 today compared to 9.5 previously. Index low right paratracheal/precarinal node measured previously at 1.8 cm short axis is 2.0 cm short axis today. SUV max = 5.8 today compared to 6.4 previously. Subcarinal lymph node measured previously at 2.2 cm short axis is 2.3 cm short axis today. SUV max = 7.1 compared to 7.0 previously. Index left infrahilar node measured with SUV max = 6.4 previously demonstrates SUV max = 5.5 today. Incidental CT findings: No suspicious pulmonary nodule or mass. Left Port-A-Cath tip is positioned in the distal SVC. Coronary artery calcification is evident. Atherosclerotic calcification is noted in the wall of the thoracic aorta. Changes of  centrilobular and paraseptal emphysema noted in the lungs bilaterally. ABDOMEN/PELVIS: No abnormal hypermetabolic activity within the liver, pancreas, adrenal glands, or spleen. No hypermetabolic lymph nodes in the abdomen or pelvis. Incidental CT findings: Hypodensities in the liver parenchyma are similar to prior, compatible with cysts. There is abdominal aortic atherosclerosis without aneurysm. Diverticular changes noted left colon. SKELETON: No focal hypermetabolic activity to suggest skeletal metastasis. Incidental CT findings: none IMPRESSION: 1. Interval decrease in hypermetabolism associated with the tongue and floor of mouth. 2. Hypermetabolic metastatic lymphadenopathy in the chest is similar to prior. 3. No definite new sites of hypermetabolic disease identified on the current exam. Electronically Signed   By: Misty Stanley M.D.   On: 09/16/2019 13:30     ASSESSMENT:  1. 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 -6 cycles of carboplatin, paclitaxel and pembrolizumab from 05/03/2018 through 08/21/2018. -Maintenance pembrolizumab started on 09/11/2018. -PET scan on 09/15/2019 showed interval decrease in hypermetabolic areas associated with tongue and floor of the mouth.  Hypermetabolic metastatic lymphadenopathy in the chest is stable.  No new sites seen.  2.  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   PLAN:  1. 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 -I have reviewed labs.  White count is mildly low but ANC is normal.  He does not have any immunotherapy related side effects. -We will proceed with Keytruda every 3 weeks.  I will see him back in 6 weeks for toxicity assessment.  2. Hypothyroidism: -Last TSH was elevated at 8.17  on 09/18/2019.  We will closely monitor.  Continue Synthroid 75 mcg.  3. 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  4. Bilateral knee pains: -Well controlled with hydrocodone as needed.  5. Nutrition: -Continue drinking boost every day.  Weight has been stable.   Orders placed this encounter:  No orders of the defined types were placed in this encounter.    Derek Jack, MD [place] [phone]   I, Milinda Antis, am acting as a scribe for Dr. Sanda Linger.  I, Derek Jack MD, have reviewed the above documentation for accuracy and completeness, and I agree with the above.

## 2019-10-10 MED ORDER — HYDROCODONE-ACETAMINOPHEN 5-325 MG PO TABS: ORAL_TABLET | ORAL | 0 refills | Status: DC

## 2019-10-30 ENCOUNTER — Inpatient Hospital Stay (HOSPITAL_COMMUNITY): Payer: Medicare HMO

## 2019-10-30 ENCOUNTER — Other Ambulatory Visit (HOSPITAL_COMMUNITY): Payer: Self-pay

## 2019-10-30 ENCOUNTER — Other Ambulatory Visit: Payer: Self-pay

## 2019-10-30 ENCOUNTER — Encounter (HOSPITAL_COMMUNITY): Payer: Self-pay

## 2019-10-30 ENCOUNTER — Inpatient Hospital Stay (HOSPITAL_COMMUNITY): Payer: Medicare HMO | Attending: Hematology

## 2019-10-30 VITALS — BP 93/54 | HR 66 | Temp 97.7°F | Resp 18

## 2019-10-30 DIAGNOSIS — Z87891 Personal history of nicotine dependence: Secondary | ICD-10-CM | POA: Diagnosis not present

## 2019-10-30 DIAGNOSIS — Z923 Personal history of irradiation: Secondary | ICD-10-CM | POA: Insufficient documentation

## 2019-10-30 DIAGNOSIS — Z5112 Encounter for antineoplastic immunotherapy: Secondary | ICD-10-CM | POA: Diagnosis not present

## 2019-10-30 DIAGNOSIS — C3492 Malignant neoplasm of unspecified part of left bronchus or lung: Secondary | ICD-10-CM | POA: Diagnosis not present

## 2019-10-30 DIAGNOSIS — Z85810 Personal history of malignant neoplasm of tongue: Secondary | ICD-10-CM | POA: Insufficient documentation

## 2019-10-30 DIAGNOSIS — M25561 Pain in right knee: Secondary | ICD-10-CM | POA: Insufficient documentation

## 2019-10-30 DIAGNOSIS — Z9221 Personal history of antineoplastic chemotherapy: Secondary | ICD-10-CM | POA: Diagnosis not present

## 2019-10-30 DIAGNOSIS — N189 Chronic kidney disease, unspecified: Secondary | ICD-10-CM | POA: Diagnosis not present

## 2019-10-30 DIAGNOSIS — E039 Hypothyroidism, unspecified: Secondary | ICD-10-CM | POA: Diagnosis not present

## 2019-10-30 DIAGNOSIS — C109 Malignant neoplasm of oropharynx, unspecified: Secondary | ICD-10-CM

## 2019-10-30 DIAGNOSIS — M25562 Pain in left knee: Secondary | ICD-10-CM | POA: Diagnosis not present

## 2019-10-30 DIAGNOSIS — Z79899 Other long term (current) drug therapy: Secondary | ICD-10-CM | POA: Insufficient documentation

## 2019-10-30 LAB — CBC WITH DIFFERENTIAL/PLATELET
Abs Immature Granulocytes: 0.01 10*3/uL (ref 0.00–0.07)
Basophils Absolute: 0 10*3/uL (ref 0.0–0.1)
Basophils Relative: 1 %
Eosinophils Absolute: 0.2 10*3/uL (ref 0.0–0.5)
Eosinophils Relative: 5 %
HCT: 39.2 % (ref 39.0–52.0)
Hemoglobin: 12.8 g/dL — ABNORMAL LOW (ref 13.0–17.0)
Immature Granulocytes: 0 %
Lymphocytes Relative: 29 %
Lymphs Abs: 1.2 10*3/uL (ref 0.7–4.0)
MCH: 30.8 pg (ref 26.0–34.0)
MCHC: 32.7 g/dL (ref 30.0–36.0)
MCV: 94.5 fL (ref 80.0–100.0)
Monocytes Absolute: 0.4 10*3/uL (ref 0.1–1.0)
Monocytes Relative: 10 %
Neutro Abs: 2.3 10*3/uL (ref 1.7–7.7)
Neutrophils Relative %: 55 %
Platelets: 179 10*3/uL (ref 150–400)
RBC: 4.15 MIL/uL — ABNORMAL LOW (ref 4.22–5.81)
RDW: 15 % (ref 11.5–15.5)
WBC: 4.2 10*3/uL (ref 4.0–10.5)
nRBC: 0 % (ref 0.0–0.2)

## 2019-10-30 LAB — COMPREHENSIVE METABOLIC PANEL
ALT: 18 U/L (ref 0–44)
AST: 23 U/L (ref 15–41)
Albumin: 4.2 g/dL (ref 3.5–5.0)
Alkaline Phosphatase: 56 U/L (ref 38–126)
Anion gap: 13 (ref 5–15)
BUN: 32 mg/dL — ABNORMAL HIGH (ref 8–23)
CO2: 25 mmol/L (ref 22–32)
Calcium: 9.2 mg/dL (ref 8.9–10.3)
Chloride: 103 mmol/L (ref 98–111)
Creatinine, Ser: 2.1 mg/dL — ABNORMAL HIGH (ref 0.61–1.24)
GFR calc Af Amer: 36 mL/min — ABNORMAL LOW (ref >60)
GFR calc non Af Amer: 31 mL/min — ABNORMAL LOW (ref >60)
Glucose, Bld: 81 mg/dL (ref 70–99)
Potassium: 4.4 mmol/L (ref 3.5–5.1)
Sodium: 141 mmol/L (ref 135–145)
Total Bilirubin: 0.4 mg/dL (ref 0.3–1.2)
Total Protein: 7.3 g/dL (ref 6.5–8.1)

## 2019-10-30 LAB — TSH: TSH: 2.234 u[IU]/mL (ref 0.350–4.500)

## 2019-10-30 MED ORDER — SODIUM CHLORIDE 0.9 % IV SOLN: Freq: Once | INTRAVENOUS | Status: AC

## 2019-10-30 MED ORDER — HEPARIN SOD (PORK) LOCK FLUSH 100 UNIT/ML IV SOLN
500.0000 [IU] | Freq: Once | INTRAVENOUS | Status: AC | PRN
  Administered 2019-10-30: 500 [IU]

## 2019-10-30 MED ORDER — SODIUM CHLORIDE 0.9% FLUSH
10.0000 mL | INTRAVENOUS | Status: DC | PRN
  Administered 2019-10-30: 10 mL

## 2019-10-30 MED ORDER — SODIUM CHLORIDE 0.9 % IV SOLN: INTRAVENOUS | Status: DC

## 2019-10-30 MED ORDER — HYDROCODONE-ACETAMINOPHEN 5-325 MG PO TABS: ORAL_TABLET | ORAL | 0 refills | Status: DC

## 2019-10-30 MED ORDER — SODIUM CHLORIDE 0.9 % IV SOLN
200.0000 mg | Freq: Once | INTRAVENOUS | Status: AC
  Administered 2019-10-30: 200 mg via INTRAVENOUS
  Filled 2019-10-30: qty 8

## 2019-10-30 NOTE — Progress Notes (Signed)
Serum creatinine 2.10 today.  Dr. Delton Coombes notified.  Ok to treat today and give NACL 500 ml bolus before treatment verbal order Dr. Delton Coombes.   Patient tolerated therapy with no complaints voiced.  Side effects with management reviewed with understanding verbalized.  Port site clean and dry with no bruising or swelling noted at site.  Good blood return noted before and after administration of therapy.  Band aid applied.  Patient left in satisfactory condition with VSS and no s/s of distress noted.

## 2019-11-20 ENCOUNTER — Inpatient Hospital Stay (HOSPITAL_BASED_OUTPATIENT_CLINIC_OR_DEPARTMENT_OTHER): Payer: Medicare HMO | Admitting: Hematology

## 2019-11-20 ENCOUNTER — Inpatient Hospital Stay (HOSPITAL_COMMUNITY): Payer: Medicare HMO

## 2019-11-20 ENCOUNTER — Other Ambulatory Visit: Payer: Self-pay

## 2019-11-20 VITALS — BP 120/66 | HR 60 | Temp 97.5°F | Resp 18

## 2019-11-20 VITALS — BP 112/70 | HR 73 | Temp 97.3°F | Resp 18 | Wt 140.6 lb

## 2019-11-20 DIAGNOSIS — C109 Malignant neoplasm of oropharynx, unspecified: Secondary | ICD-10-CM

## 2019-11-20 DIAGNOSIS — N189 Chronic kidney disease, unspecified: Secondary | ICD-10-CM | POA: Diagnosis not present

## 2019-11-20 DIAGNOSIS — M25562 Pain in left knee: Secondary | ICD-10-CM | POA: Diagnosis not present

## 2019-11-20 DIAGNOSIS — E039 Hypothyroidism, unspecified: Secondary | ICD-10-CM | POA: Diagnosis not present

## 2019-11-20 DIAGNOSIS — M25561 Pain in right knee: Secondary | ICD-10-CM | POA: Diagnosis not present

## 2019-11-20 DIAGNOSIS — Z9221 Personal history of antineoplastic chemotherapy: Secondary | ICD-10-CM | POA: Diagnosis not present

## 2019-11-20 DIAGNOSIS — Z79899 Other long term (current) drug therapy: Secondary | ICD-10-CM | POA: Diagnosis not present

## 2019-11-20 DIAGNOSIS — C3492 Malignant neoplasm of unspecified part of left bronchus or lung: Secondary | ICD-10-CM | POA: Diagnosis not present

## 2019-11-20 DIAGNOSIS — Z87891 Personal history of nicotine dependence: Secondary | ICD-10-CM | POA: Diagnosis not present

## 2019-11-20 DIAGNOSIS — Z85810 Personal history of malignant neoplasm of tongue: Secondary | ICD-10-CM | POA: Diagnosis not present

## 2019-11-20 DIAGNOSIS — Z5112 Encounter for antineoplastic immunotherapy: Secondary | ICD-10-CM | POA: Diagnosis not present

## 2019-11-20 LAB — COMPREHENSIVE METABOLIC PANEL
ALT: 22 U/L (ref 0–44)
AST: 20 U/L (ref 15–41)
Albumin: 4 g/dL (ref 3.5–5.0)
Alkaline Phosphatase: 44 U/L (ref 38–126)
Anion gap: 9 (ref 5–15)
BUN: 29 mg/dL — ABNORMAL HIGH (ref 8–23)
CO2: 26 mmol/L (ref 22–32)
Calcium: 9.1 mg/dL (ref 8.9–10.3)
Chloride: 105 mmol/L (ref 98–111)
Creatinine, Ser: 1.71 mg/dL — ABNORMAL HIGH (ref 0.61–1.24)
GFR calc Af Amer: 46 mL/min — ABNORMAL LOW (ref >60)
GFR calc non Af Amer: 40 mL/min — ABNORMAL LOW (ref >60)
Glucose, Bld: 90 mg/dL (ref 70–99)
Potassium: 4.4 mmol/L (ref 3.5–5.1)
Sodium: 140 mmol/L (ref 135–145)
Total Bilirubin: 0.6 mg/dL (ref 0.3–1.2)
Total Protein: 6.9 g/dL (ref 6.5–8.1)

## 2019-11-20 LAB — CBC WITH DIFFERENTIAL/PLATELET
Abs Immature Granulocytes: 0.01 10*3/uL (ref 0.00–0.07)
Basophils Absolute: 0 10*3/uL (ref 0.0–0.1)
Basophils Relative: 0 %
Eosinophils Absolute: 0.1 10*3/uL (ref 0.0–0.5)
Eosinophils Relative: 2 %
HCT: 39.8 % (ref 39.0–52.0)
Hemoglobin: 12.9 g/dL — ABNORMAL LOW (ref 13.0–17.0)
Immature Granulocytes: 0 %
Lymphocytes Relative: 23 %
Lymphs Abs: 1.1 10*3/uL (ref 0.7–4.0)
MCH: 30.5 pg (ref 26.0–34.0)
MCHC: 32.4 g/dL (ref 30.0–36.0)
MCV: 94.1 fL (ref 80.0–100.0)
Monocytes Absolute: 0.5 10*3/uL (ref 0.1–1.0)
Monocytes Relative: 11 %
Neutro Abs: 2.9 10*3/uL (ref 1.7–7.7)
Neutrophils Relative %: 64 %
Platelets: 170 10*3/uL (ref 150–400)
RBC: 4.23 MIL/uL (ref 4.22–5.81)
RDW: 15.2 % (ref 11.5–15.5)
WBC: 4.6 10*3/uL (ref 4.0–10.5)
nRBC: 0 % (ref 0.0–0.2)

## 2019-11-20 MED ORDER — SODIUM CHLORIDE 0.9 % IV SOLN: Freq: Once | INTRAVENOUS | Status: AC

## 2019-11-20 MED ORDER — SODIUM CHLORIDE 0.9 % IV SOLN
200.0000 mg | Freq: Once | INTRAVENOUS | Status: AC
  Administered 2019-11-20: 200 mg via INTRAVENOUS
  Filled 2019-11-20: qty 8

## 2019-11-20 MED ORDER — HYDROCODONE-ACETAMINOPHEN 5-325 MG PO TABS: ORAL_TABLET | ORAL | 0 refills | Status: DC

## 2019-11-20 MED ORDER — SODIUM CHLORIDE 0.9% FLUSH
10.0000 mL | INTRAVENOUS | Status: DC | PRN
  Administered 2019-11-20: 10 mL

## 2019-11-20 MED ORDER — HEPARIN SOD (PORK) LOCK FLUSH 100 UNIT/ML IV SOLN
500.0000 [IU] | Freq: Once | INTRAVENOUS | Status: AC | PRN
  Administered 2019-11-20: 500 [IU]

## 2019-11-20 NOTE — Patient Instructions (Signed)
[place] at [facility] Discharge Instructions  You were seen today by Dr. Delton Coombes. He went over your recent results. You received your treatment today; you will receive your next treatment in 3 weeks. Dr. Delton Coombes will see you back in 6 weeks for labs and follow up.   Thank you for choosing [place] at [facility] to provide your oncology and hematology care.  To afford each patient quality time with our provider, please arrive at least 15 minutes before your scheduled appointment time.   If you have a lab appointment with the [facility] please come in thru the Main Entrance and check in at the main information desk  You need to re-schedule your appointment should you arrive 10 or more minutes late.  We strive to give you quality time with our providers, and arriving late affects you and other patients whose appointments are after yours.  Also, if you no show three or more times for appointments you may be dismissed from the clinic at the providers discretion.     Again, thank you for choosing [facility].  Our hope is that these requests will decrease the amount of time that you wait before being seen by our physicians.       _____________________________________________________________  Should you have questions after your visit to [facility], please contact our office at [phone] between the hours of 8:00 a.m. and 4:30 p.m.  Voicemails left after 4:00 p.m. will not be returned until the following business day.  For prescription refill requests, have your pharmacy contact our office and allow 72 hours.    Cancer Center Support Programs:   > Cancer Support Group  2nd Tuesday of the month 1pm-2pm, Journey Room

## 2019-11-20 NOTE — Progress Notes (Signed)
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  NGS Results: Foundation 1 MS--[name]  CURRENT THERAPY: Keytruda every 3 weeks  BRIEF ONCOLOGIC HISTORY:  Oncology History  Oropharyngeal carcinoma ([place] [facility])  07/27/2014 Imaging   CT neck- Advanced stage oropharyngeal cancer with necrotic adenopathy accounting for the left neck swelling.   07/28/2014 Initial Diagnosis   Oropharyngeal cancer   08/03/2014 Imaging   CT CAP- L supraclavicular lymphadenopathy is not completely visualized. This is better seen on the previous neck CT from 07/27/2014. Otherwise, no evidence for metastatic disease in the chest, abdomen, or pelvis.   08/03/2014 Imaging   Bone scan- Uptake at adjacent anterior LEFT 6, 7, 8 ribs likely representing trauma/fractures. Questionable nonspecific increased tracer localization at the posterior RIGHT 8th and 9th ribs, the adjacent nature which raises a a question of trauma as well   08/06/2014 Pathology Results   Alexander [name]- Oropharynx, biopsy, Left - INVASIVE SQUAMOUS CELL CARCINOMA.   08/12/2014 Procedure   Alexander [name]- 1. Multiple extraction of tooth numbers 6, 17, 22, 23, 24, 25, 26, and 27. 3 Quadrants of alveoloplasty   08/17/2014 Pathology Results   PORT and G-TUBE placed by Alexander [name].   08/26/2014 PET scan   Large hypermetabolic mass in the left base of tongue. Activity extends across midline to the right base tongue. 2. Intensely hypermetabolic left cervical metastatic lymph nodes. Lymph nodes extend from the left level II position to the left supraclavi   09/01/2014 - 09/22/2014 Chemotherapy   Concurrent chemoradiation with Cisplatin 100 mg/m2 x 2 cycles with Alexander [name]  support. Held cycle #3 d/t renal toxicity.    09/03/2014 - 10/23/2014 Radiation Therapy   Treated in [place], IMRT Alexander [name]).  Base of tongue and bilat neck. Total dose: 70 Gy in 35 fractions. (of note, he did miss several treatments requiring BID dosing towards the end of treatment).    01/25/2015 PET scan   Near complete resolution of metabolic activity at the base of tongue. Minimal residual activity is likely post treatment effect. 2. Complete resolution of metabolic activity above LEFT cervical lymph nodes. No evidence of residual metabolically active    2/45/8099 Procedure   Port-a-cath removed Alexander [name])    05/03/2018 - 09/10/2018 Chemotherapy   The patient had palonosetron (ALOXI) injection 0.25 mg, 0.25 mg, Intravenous,  Once, 6 of 6 cycles Administration: 0.25 mg (05/03/2018), 0.25 mg (05/24/2018), 0.25 mg (06/14/2018), 0.25 mg (07/09/2018), 0.25 mg (07/30/2018), 0.25 mg (08/21/2018) pegfilgrastim-cbqv (UDENYCA) injection 6 mg, 6 mg, Subcutaneous, Once, 5 of 5 cycles Administration: 6 mg (05/27/2018), 6 mg (06/17/2018), 6 mg (07/11/2018), 6 mg (08/01/2018), 6 mg (08/23/2018) CARBOplatin (PARAPLATIN) 380 mg in sodium chloride 0.9 % 250 mL chemo infusion, 380 mg (100 % of original dose 381 mg), Intravenous,  Once, 6 of 6 cycles Dose modification:   (original dose 381 mg, Cycle 1),   (original dose 309.5 mg, Cycle 2), 307.5 mg (original dose 309.5 mg, Cycle 5) Administration: 380 mg (05/03/2018), 310 mg (05/24/2018), 310 mg (06/14/2018), 340 mg (07/09/2018), 310 mg (07/30/2018), 350 mg (08/21/2018) PACLitaxel (TAXOL) 330 mg in sodium chloride 0.9 % 500 mL chemo infusion (> 78m/m2), 175 mg/m2 = 330 mg (100 % of original dose 175 mg/m2),  Intravenous,  Once, 6 of 6 cycles Dose modification: 175 mg/m2 (original dose 175 mg/m2, Cycle 1, Reason: Patient Age) Administration: 330 mg (05/03/2018), 330 mg (05/24/2018), 330 mg (06/14/2018), 330 mg (07/09/2018), 330 mg (07/30/2018), 330 mg (08/21/2018)  for chemotherapy treatment.      05/24/2018 -  Chemotherapy   The patient had pembrolizumab (KEYTRUDA) 200 mg in sodium chloride 0.9 % 50 mL chemo infusion, 200 mg, Intravenous, Once, 25 of 29 cycles Administration: 200 mg (05/24/2018), 200 mg (06/14/2018), 200 mg (07/09/2018), 200 mg (08/21/2018), 200 mg (09/11/2018), 200 mg (10/02/2018), 200 mg (10/23/2018), 200 mg (11/13/2018), 200 mg (12/04/2018), 200 mg (12/25/2018), 200 mg (01/22/2019), 200 mg (02/12/2019), 200 mg (03/05/2019), 200 mg (03/26/2019), 200 mg (04/16/2019), 200 mg (05/07/2019), 200 mg (06/03/2019), 200 mg (06/24/2019), 200 mg (07/17/2019), 200 mg (08/07/2019), 200 mg (08/28/2019), 200 mg (09/18/2019), 200 mg (10/09/2019), 200 mg (10/30/2019)  for chemotherapy treatment.    Squamous cell lung cancer, left (Alexander [name])  06/14/2018 Initial Diagnosis   Squamous cell lung cancer, left ([facility])     CANCER STAGING: Cancer Staging Oropharyngeal carcinoma (Alexander [name]) Staging form: Pharynx - Oropharynx, AJCC 7th Edition - Clinical: Stage IVA (T4a, N2b, M0) - Unsigned   INTERVAL HISTORY:  [name], a 69 y.o. male, returns for routine follow-up and consideration for next cycle of chemotherapy. Alexander [name] was last seen on 10/09/2019.  Due for cycle #26 of pembrolizumab today.   Overall, he tells me he has been feeling pretty well. He denies having any new cough, diarrhea, itching, rashes, or new pains. His appetite   Overall, he feels ready for next cycle of chemo today.    REVIEW OF SYSTEMS:  Review of Systems  Constitutional: Negative for appetite change and fatigue.  Respiratory: Negative for cough.   Gastrointestinal: Negative for diarrhea.  Musculoskeletal: Positive for arthralgias (knee pain) and back pain (8/10 back pain).  Skin: Negative for itching and rash.  All other systems reviewed and are negative.   PAST MEDICAL/SURGICAL HISTORY:  Past Medical History:  Diagnosis Date  . GERD (gastroesophageal reflux disease)   . Mass of neck    dx. oropharyngeal squamous cell  carcinoma- Chemo. radiation planned  . Oropharyngeal cancer ([facility]) 07/28/2014   dx. 3 weeks ago.- Dr. Oneal [facility] [place], [place].  Marland Kitchen 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[place]) 08/06/2014   SCCa of Left BOT   Past Surgical History:  Procedure Laterality Date  . BIOPSY  01/15/2018   Procedure: BIOPSY;  Surgeon: Danie Binder, MD;  [facility];  Service: Endoscopy;;  gastric  . COLONOSCOPY N/A 03/13/2016   Procedure: COLONOSCOPY;  Surgeon: Danie Binder, MD;  [facility];  Service: Endoscopy;  Laterality: N/A;  2:15 PM  . ESOPHAGOGASTRODUODENOSCOPY (EGD) WITH PROPOFOL N/A 08/17/2014   Procedure: ESOPHAGOGASTRODUODENOSCOPY (EGD) WITH PROPOFOL (procedure #1);  Surgeon: Aviva Signs Md, MD;  Location: [facility];  Service: General;  Laterality: N/A;  . ESOPHAGOGASTRODUODENOSCOPY (EGD) WITH PROPOFOL N/A 01/15/2018   Procedure: ESOPHAGOGASTRODUODENOSCOPY (EGD) WITH PROPOFOL;  Surgeon: Danie Binder, MD;  [facility];  Service: Endoscopy;  Laterality: N/A;  9:30am  . MULTIPLE EXTRACTIONS WITH ALVEOLOPLASTY N/A 08/12/2014   Procedure: Extraction of tooth #'s 6,17,22,23,24,25,26,27 with alveoloplasty;  Surgeon: Lenn Cal, DDS;  Location: [facility];  Service: Oral Surgery;  Laterality: N/A;  . PANENDOSCOPY N/A 08/06/2014   Procedure: PANENDOSCOPY WITH BIOPSY;  Surgeon: Leta Baptist, MD;  Location: [facility];  Service: ENT;  Laterality: N/A;  .  PEG PLACEMENT Left 08/17/14  . PEG PLACEMENT N/A 08/17/2014   Procedure: PERCUTANEOUS ENDOSCOPIC GASTROSTOMY (PEG) PLACEMENT (procedure #1);  Surgeon: Aviva Signs Md, MD;  Location: [facility];  Service: General;  Laterality: N/A;  . PORT-A-CATH REMOVAL Right 07/17/2016   Procedure: MINOR REMOVAL PORT-A-CATH;  Surgeon: Aviva Signs, MD;  Location: [facility];  Service: General;  Laterality: Right;  . PORTACATH PLACEMENT Right 08/17/14  . PORTACATH PLACEMENT Right 08/17/2014   Procedure: INSERTION PORT-A-CATH  (procedure #2);  Surgeon: Aviva Signs Md, MD;  Location[facility];  Service: General;  Laterality: Right;  . PORTACATH PLACEMENT Left 04/26/2018   Procedure: INSERTION PORT-A-CATH (attached catheter in left subclavian);  Surgeon: Aviva Signs, MD;  Location[facility];  Service: General;  Laterality: Left;  . SAVORY DILATION N/A 01/15/2018   Procedure: SAVORY DILATION;  Surgeon: Danie Binder, MD;  [facility];  Service: Endoscopy;  Laterality: N/A;  . VIDEO BRONCHOSCOPY WITH ENDOBRONCHIAL ULTRASOUND N/A 04/15/2018   Procedure: VIDEO BRONCHOSCOPY WITH ENDOBRONCHIAL ULTRASOUND;  Surgeon: Melrose Nakayama, MD;  Location: [place] [facility];  Service: Thoracic;  Laterality: N/A;    SOCIAL HISTORY:  Social History   Socioeconomic History  . Marital status: Legally Separated    Spouse name: Not on file  . Number of children: 5  . Years of education: Not on file  . Highest education level: Not on file  Occupational History  . Not on file  Tobacco Use  . Smoking status: Former Smoker    Packs/day: 0.50    Years: 30.00    Pack years: 15.00    Quit date: 07/22/2014    Years since quitting: 5.3  . Smokeless tobacco: Never Used  Substance and Sexual Activity  . Alcohol use: Not Currently    Alcohol/week: 0.0 standard drinks    Comment: None currently (11/09/17); previously 1-2 beers on the weekend  . Drug use: No  . Sexual activity: Not on file  Other Topics Concern  . Not on file  Social History Narrative  . Not on file   Social Determinants of Health   Financial Resource Strain:   . Difficulty of Paying Living Expenses: Not on file  Food Insecurity:   . Worried About Charity fundraiser in the Last Year: Not on file  . Ran Out of Food in the Last Year: Not on file  Transportation Needs:   . Lack of Transportation (Medical): Not on file  . Lack of Transportation (Non-Medical): Not on file  Physical Activity:   . Days of Exercise per Week: Not on file  . Minutes of Exercise per  Session: Not on file  Stress:   . Feeling of Stress : Not on file  Social Connections:   . Frequency of Communication with Friends and Family: Not on file  . Frequency of Social Gatherings with Friends and Family: Not on file  . Attends Religious Services: Not on file  . Active Member of Clubs or Organizations: Not on file  . Attends Archivist Meetings: Not on file  . Marital Status: Not on file  Intimate Partner Violence:   . Fear of Current or Ex-Partner: Not on file  . Emotionally Abused: Not on file  . Physically Abused: Not on file  . Sexually Abused: Not on file    FAMILY HISTORY:  Family History  Problem Relation Age of Onset  . Colon cancer Neg Hx   . Gastric cancer Neg Hx   . Esophageal cancer Neg Hx  CURRENT MEDICATIONS:  Current Outpatient Medications  Medication Sig Dispense Refill  . feeding supplement, ENSURE ENLIVE, (ENSURE ENLIVE) LIQD Take 237 mLs by mouth 4 (four) times daily.     Marland Kitchen HYDROcodone-acetaminophen (NORCO/VICODIN) 5-325 MG tablet TAKE 1 TABLET EVERY 12 HOURS AS NEEDED FOR MODERATE PAIN 30 tablet 0  . levothyroxine (SYNTHROID) 88 MCG tablet Take 1 tablet (88 mcg total) by mouth daily before breakfast. 30 tablet 3  . Melatonin 10 MG TABS Take 1 tablet by mouth at bedtime.    . naproxen sodium (ALEVE) 220 MG tablet Take 220 mg by mouth daily as needed.     Marland Kitchen omeprazole (PRILOSEC) 20 MG capsule TAKE 1 CAPSULE BY MOUTH 30 MINUTES PRIOR TO BREAKFAST 90 capsule 3  . Pembrolizumab (KEYTRUDA IV) Inject into the vein every 21 ( twenty-one) days.     No current facility-administered medications for this visit.   Facility-Administered Medications Ordered in Other Visits  Medication Dose Route Frequency Provider Last Rate Last Admin  . sodium chloride flush (NS) 0.9 % injection 10 mL  10 mL Intracatheter PRN Derek Jack, MD   10 mL at 06/24/19 0925    ALLERGIES:  No Known Allergies  PHYSICAL EXAM:  Performance status (ECOG): 1 -  Symptomatic but completely ambulatory  Vitals:   11/20/19 1335  BP: 112/70  Pulse: 73  Resp: 18  Temp: (!) 97.3 F (36.3 C)  SpO2: 98%   Wt Readings from Last 3 Encounters:  11/20/19 140 lb 9.6 oz (63.8 kg)  10/30/19 136 lb 9.6 oz (62 kg)  10/09/19 136 lb (61.7 kg)   Physical Exam Vitals reviewed.  Constitutional:      Appearance: Normal appearance.  Cardiovascular:     Rate and Rhythm: Normal rate and regular rhythm.     Heart sounds: Normal heart sounds.  Pulmonary:     Effort: Pulmonary effort is normal.     Breath sounds: Normal breath sounds.  Chest:     Comments: Port-a-Cath in L chest Neurological:     General: No focal deficit present.     Mental Status: He is alert and oriented to person, place, and time.  Psychiatric:        Mood and Affect: Mood normal.        Behavior: Behavior normal.     LABORATORY DATA:  I have reviewed the labs as listed.  CBC Latest Ref Rng & Units 11/20/2019 10/30/2019 10/09/2019  WBC 4.0 - 10.5 K/uL 4.6 4.2 3.4(L)  Hemoglobin 13.0 - 17.0 g/dL 12.9(L) 12.8(L) 13.0  Hematocrit 39 - 52 % 39.8 39.2 40.3  Platelets 150 - 400 K/uL 170 179 170   CMP Latest Ref Rng & Units 11/20/2019 10/30/2019 10/09/2019  Glucose 70 - 99 mg/dL 90 81 89  BUN 8 - 23 mg/dL 29(H) 32(H) 27(H)  Creatinine 0.61 - 1.24 mg/dL 1.71(H) 2.10(H) 1.88(H)  Sodium 135 - 145 mmol/L 140 141 138  Potassium 3.5 - 5.1 mmol/L 4.4 4.4 4.3  Chloride 98 - 111 mmol/L 105 103 104  CO2 22 - 32 mmol/L 26 25 26   Calcium 8.9 - 10.3 mg/dL 9.1 9.2 8.8(L)  Total Protein 6.5 - 8.1 g/dL 6.9 7.3 7.0  Total Bilirubin 0.3 - 1.2 mg/dL 0.6 0.4 0.6  Alkaline Phos 38 - 126 U/L 44 56 48  AST 15 - 41 U/L 20 23 18   ALT 0 - 44 U/L 22 18 15    Lab Results  Component Value Date   LDH 103 10/09/2019  LDH 127 06/14/2018   LDH 107 05/24/2018    DIAGNOSTIC IMAGING:  I have independently reviewed the scans and discussed with the patient. No results found.   ASSESSMENT:  1. 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 -6 cycles of carboplatin, paclitaxel and pembrolizumab from 05/03/2018 through 08/21/2018. -Maintenance pembrolizumab started on 09/11/2018. -PET scan on 09/15/2019 showed interval decrease in hypermetabolic areas associated with tongue and floor of the mouth.  Hypermetabolic metastatic lymphadenopathy in the chest is [name].  No new sites seen.  2. 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   PLAN:  1. 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 -He reportedly hurt his back while trying to help his daughter move. -I reviewed his labs.  LFTs are normal.  CBC shows normal white count and platelets. -We will proceed with his Keytruda today and in 3 weeks.  I will plan to see him back in 6 weeks.  I will arrange for scans in 6 months.  2. Hypothyroidism: -Continue Synthroid.  Last TSH was 2.2.  3. 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  4. Bilateral knee pains: -Well controlled with hydrocodone as needed.  We will give a refill.  5. Nutrition: -Continue boost every day.  6.  CKD: -His creatinine improved to baseline of 1.71.  Will give normal saline 100 mL today.   Orders placed this encounter:  No orders of the defined types were placed in this encounter.    Derek Jack, MD [place] [phone]   I, Milinda Antis, am acting as a scribe for Dr. Sanda Linger.  I, Derek Jack MD, have reviewed the above documentation for accuracy and completeness, and I agree with the above.

## 2019-11-20 NOTE — Progress Notes (Signed)
Labs reviewed with Dr Raliegh Ip, okay to proceed with treatment. Creatinine 1.71, noted.  500 mL NS bolus given.  Alexander [name] tolerated keytruda well today without incidence. Vital signs stable prior to discharge.  Discharged ambulatory.

## 2019-11-20 NOTE — Patient Instructions (Signed)
[facility] Discharge Instructions for Patients Receiving Chemotherapy  Today you received the following chemotherapy agents   To help prevent nausea and vomiting after your treatment, we encourage you to take your nausea medication   If you develop nausea and vomiting that is not controlled by your nausea medication, call the clinic.   BELOW ARE SYMPTOMS THAT SHOULD BE REPORTED IMMEDIATELY:  *FEVER GREATER THAN 100.5 F  *CHILLS WITH OR WITHOUT FEVER  NAUSEA AND VOMITING THAT IS NOT CONTROLLED WITH YOUR NAUSEA MEDICATION  *UNUSUAL SHORTNESS OF BREATH  *UNUSUAL BRUISING OR BLEEDING  TENDERNESS IN MOUTH AND THROAT WITH OR WITHOUT PRESENCE OF ULCERS  *URINARY PROBLEMS  *BOWEL PROBLEMS  UNUSUAL RASH Items with * indicate a potential emergency and should be followed up as soon as possible.  Feel free to call the clinic should you have any questions or concerns. The clinic phone number is [phone].  Please show the CHEMO ALERT CARD at check-in to the Emergency Department and triage nurse.   

## 2019-11-24 DIAGNOSIS — C109 Malignant neoplasm of oropharynx, unspecified: Secondary | ICD-10-CM | POA: Diagnosis not present

## 2019-11-24 DIAGNOSIS — C7800 Secondary malignant neoplasm of unspecified lung: Secondary | ICD-10-CM | POA: Diagnosis not present

## 2019-11-24 DIAGNOSIS — Z79899 Other long term (current) drug therapy: Secondary | ICD-10-CM | POA: Diagnosis not present

## 2019-11-24 DIAGNOSIS — C01 Malignant neoplasm of base of tongue: Secondary | ICD-10-CM | POA: Diagnosis not present

## 2019-11-24 DIAGNOSIS — R599 Enlarged lymph nodes, unspecified: Secondary | ICD-10-CM | POA: Diagnosis not present

## 2019-11-24 DIAGNOSIS — R972 Elevated prostate specific antigen [PSA]: Secondary | ICD-10-CM | POA: Diagnosis not present

## 2019-12-11 ENCOUNTER — Inpatient Hospital Stay (HOSPITAL_COMMUNITY): Payer: Medicare HMO | Attending: Hematology

## 2019-12-11 ENCOUNTER — Other Ambulatory Visit: Payer: Self-pay

## 2019-12-11 ENCOUNTER — Other Ambulatory Visit (HOSPITAL_COMMUNITY): Payer: Self-pay | Admitting: Nurse Practitioner

## 2019-12-11 ENCOUNTER — Inpatient Hospital Stay (HOSPITAL_COMMUNITY): Payer: Medicare HMO

## 2019-12-11 VITALS — BP 110/58 | HR 55 | Temp 97.0°F | Resp 18

## 2019-12-11 DIAGNOSIS — Z5112 Encounter for antineoplastic immunotherapy: Secondary | ICD-10-CM | POA: Diagnosis not present

## 2019-12-11 DIAGNOSIS — C109 Malignant neoplasm of oropharynx, unspecified: Secondary | ICD-10-CM

## 2019-12-11 DIAGNOSIS — M25561 Pain in right knee: Secondary | ICD-10-CM | POA: Insufficient documentation

## 2019-12-11 DIAGNOSIS — N189 Chronic kidney disease, unspecified: Secondary | ICD-10-CM | POA: Diagnosis not present

## 2019-12-11 DIAGNOSIS — E039 Hypothyroidism, unspecified: Secondary | ICD-10-CM

## 2019-12-11 DIAGNOSIS — Z87891 Personal history of nicotine dependence: Secondary | ICD-10-CM | POA: Insufficient documentation

## 2019-12-11 DIAGNOSIS — M25562 Pain in left knee: Secondary | ICD-10-CM | POA: Diagnosis not present

## 2019-12-11 DIAGNOSIS — Z85810 Personal history of malignant neoplasm of tongue: Secondary | ICD-10-CM | POA: Insufficient documentation

## 2019-12-11 DIAGNOSIS — C3492 Malignant neoplasm of unspecified part of left bronchus or lung: Secondary | ICD-10-CM | POA: Diagnosis not present

## 2019-12-11 DIAGNOSIS — Z923 Personal history of irradiation: Secondary | ICD-10-CM | POA: Diagnosis not present

## 2019-12-11 DIAGNOSIS — Z9221 Personal history of antineoplastic chemotherapy: Secondary | ICD-10-CM | POA: Diagnosis not present

## 2019-12-11 DIAGNOSIS — Z79899 Other long term (current) drug therapy: Secondary | ICD-10-CM | POA: Diagnosis not present

## 2019-12-11 LAB — CBC WITH DIFFERENTIAL/PLATELET
Abs Immature Granulocytes: 0.01 10*3/uL (ref 0.00–0.07)
Basophils Absolute: 0 10*3/uL (ref 0.0–0.1)
Basophils Relative: 1 %
Eosinophils Absolute: 0.2 10*3/uL (ref 0.0–0.5)
Eosinophils Relative: 4 %
HCT: 40.5 % (ref 39.0–52.0)
Hemoglobin: 12.7 g/dL — ABNORMAL LOW (ref 13.0–17.0)
Immature Granulocytes: 0 %
Lymphocytes Relative: 28 %
Lymphs Abs: 1.2 10*3/uL (ref 0.7–4.0)
MCH: 29.5 pg (ref 26.0–34.0)
MCHC: 31.4 g/dL (ref 30.0–36.0)
MCV: 94 fL (ref 80.0–100.0)
Monocytes Absolute: 0.4 10*3/uL (ref 0.1–1.0)
Monocytes Relative: 9 %
Neutro Abs: 2.5 10*3/uL (ref 1.7–7.7)
Neutrophils Relative %: 58 %
Platelets: 169 10*3/uL (ref 150–400)
RBC: 4.31 MIL/uL (ref 4.22–5.81)
RDW: 14.7 % (ref 11.5–15.5)
WBC: 4.2 10*3/uL (ref 4.0–10.5)
nRBC: 0 % (ref 0.0–0.2)

## 2019-12-11 LAB — COMPREHENSIVE METABOLIC PANEL
ALT: 22 U/L (ref 0–44)
AST: 24 U/L (ref 15–41)
Albumin: 4 g/dL (ref 3.5–5.0)
Alkaline Phosphatase: 46 U/L (ref 38–126)
Anion gap: 10 (ref 5–15)
BUN: 32 mg/dL — ABNORMAL HIGH (ref 8–23)
CO2: 25 mmol/L (ref 22–32)
Calcium: 9.1 mg/dL (ref 8.9–10.3)
Chloride: 102 mmol/L (ref 98–111)
Creatinine, Ser: 2.12 mg/dL — ABNORMAL HIGH (ref 0.61–1.24)
GFR calc Af Amer: 35 mL/min — ABNORMAL LOW (ref >60)
GFR calc non Af Amer: 31 mL/min — ABNORMAL LOW (ref >60)
Glucose, Bld: 141 mg/dL — ABNORMAL HIGH (ref 70–99)
Potassium: 4.5 mmol/L (ref 3.5–5.1)
Sodium: 137 mmol/L (ref 135–145)
Total Bilirubin: 0.7 mg/dL (ref 0.3–1.2)
Total Protein: 7.1 g/dL (ref 6.5–8.1)

## 2019-12-11 LAB — TSH: TSH: 2.295 u[IU]/mL (ref 0.350–4.500)

## 2019-12-11 MED ORDER — SODIUM CHLORIDE 0.9 % IV SOLN: Freq: Once | INTRAVENOUS | Status: AC

## 2019-12-11 MED ORDER — HYDROCODONE-ACETAMINOPHEN 5-325 MG PO TABS: ORAL_TABLET | ORAL | 0 refills | Status: DC

## 2019-12-11 MED ORDER — SODIUM CHLORIDE 0.9 % IV SOLN
200.0000 mg | Freq: Once | INTRAVENOUS | Status: AC
  Administered 2019-12-11: 200 mg via INTRAVENOUS
  Filled 2019-12-11: qty 8

## 2019-12-11 MED ORDER — HEPARIN SOD (PORK) LOCK FLUSH 100 UNIT/ML IV SOLN
500.0000 [IU] | Freq: Once | INTRAVENOUS | Status: AC | PRN
  Administered 2019-12-11: 500 [IU]

## 2019-12-11 MED ORDER — SODIUM CHLORIDE 0.9% FLUSH
10.0000 mL | INTRAVENOUS | Status: DC | PRN
  Administered 2019-12-11: 10 mL

## 2019-12-11 MED ORDER — LEVOTHYROXINE SODIUM 88 MCG PO TABS: 88.0000 ug | ORAL_TABLET | Freq: Every day | ORAL | 3 refills | Status: DC

## 2019-12-11 NOTE — Progress Notes (Signed)
Alexander [name] presents today for Keytruda infusion. Pt denies any new changes or symptoms since last treatment. Lab results, including Cr 2.12, and vitals have been reviewed and are stable and within parameters for treatment. Per Dr. Delton Coombes, ok to proceed with treatment today as planned.  Infusions tolerated without incident or complaint. VSS upon completion of treatment. Port flushed and deaccessed per protocol, see MAR and IV flowsheet for details. Discharged in satisfactory condition with follow up instructions.

## 2019-12-11 NOTE — Patient Instructions (Signed)
[facility] Discharge Instructions for Patients Receiving Chemotherapy   Beginning January 23rd 2017 lab work for the [facility] will be done in the  Main lab at [facility] on 1st floor. If you have a lab appointment with the [facility] please come in thru the  Main Entrance and check in at the main information desk   Today you received the following chemotherapy agents Keytruda  To help prevent nausea and vomiting after your treatment, we encourage you to take your nausea medication  If you develop nausea and vomiting, or diarrhea that is not controlled by your medication, call the clinic.  The clinic phone number is [phone]. Office hours are Monday-Friday 8:30am-5:00pm.  BELOW ARE SYMPTOMS THAT SHOULD BE REPORTED IMMEDIATELY:  *FEVER GREATER THAN 101.0 F  *CHILLS WITH OR WITHOUT FEVER  NAUSEA AND VOMITING THAT IS NOT CONTROLLED WITH YOUR NAUSEA MEDICATION  *UNUSUAL SHORTNESS OF BREATH  *UNUSUAL BRUISING OR BLEEDING  TENDERNESS IN MOUTH AND THROAT WITH OR WITHOUT PRESENCE OF ULCERS  *URINARY PROBLEMS  *BOWEL PROBLEMS  UNUSUAL RASH Items with * indicate a potential emergency and should be followed up as soon as possible. If you have an emergency after office hours please contact your primary care physician or go to the nearest emergency department.  Please call the clinic during office hours if you have any questions or concerns.   You may also contact the Patient Navigator at [phone] should you have any questions or need assistance in obtaining follow up care.      Resources For Cancer Patients and their Caregivers ? American Cancer Society: Can assist with transportation, wigs, general needs, runs Look Good Feel Better.        (716)671-8390 ? Cancer Care: Provides financial assistance, online support groups, medication/co-pay assistance.  1-800-813-HOPE 681-843-7763) ? [facility] Assists [place] cancer  patients and their families through emotional , educational and financial support.  574-754-3172 ? [place] DSS Where to apply for food stamps, Medicaid and utility assistance. 249-488-5758 ? [facility]: Transportation to medical appointments. 812-194-5324 ? Social Security Administration: May apply for disability if have a Stage IV cancer. 9738378038 (803)264-0065 ? [employer], Disability and Transit Services: Assists with nutrition, care and transit needs. 509-591-1257

## 2020-01-01 ENCOUNTER — Other Ambulatory Visit (HOSPITAL_COMMUNITY): Payer: Self-pay | Admitting: *Deleted

## 2020-01-01 ENCOUNTER — Inpatient Hospital Stay (HOSPITAL_COMMUNITY): Payer: Medicare HMO | Attending: Hematology

## 2020-01-01 ENCOUNTER — Inpatient Hospital Stay (HOSPITAL_BASED_OUTPATIENT_CLINIC_OR_DEPARTMENT_OTHER): Payer: Medicare HMO | Admitting: Oncology

## 2020-01-01 ENCOUNTER — Inpatient Hospital Stay (HOSPITAL_COMMUNITY): Payer: Medicare HMO

## 2020-01-01 ENCOUNTER — Other Ambulatory Visit: Payer: Self-pay

## 2020-01-01 VITALS — BP 114/71 | HR 56 | Temp 96.8°F | Resp 18

## 2020-01-01 VITALS — BP 105/68 | HR 74 | Temp 96.9°F | Resp 18 | Wt 136.9 lb

## 2020-01-01 DIAGNOSIS — C109 Malignant neoplasm of oropharynx, unspecified: Secondary | ICD-10-CM | POA: Diagnosis not present

## 2020-01-01 DIAGNOSIS — Z79899 Other long term (current) drug therapy: Secondary | ICD-10-CM | POA: Insufficient documentation

## 2020-01-01 DIAGNOSIS — M25561 Pain in right knee: Secondary | ICD-10-CM | POA: Insufficient documentation

## 2020-01-01 DIAGNOSIS — Z9221 Personal history of antineoplastic chemotherapy: Secondary | ICD-10-CM | POA: Diagnosis not present

## 2020-01-01 DIAGNOSIS — Z923 Personal history of irradiation: Secondary | ICD-10-CM | POA: Insufficient documentation

## 2020-01-01 DIAGNOSIS — M25562 Pain in left knee: Secondary | ICD-10-CM | POA: Diagnosis not present

## 2020-01-01 DIAGNOSIS — C3492 Malignant neoplasm of unspecified part of left bronchus or lung: Secondary | ICD-10-CM | POA: Diagnosis not present

## 2020-01-01 DIAGNOSIS — N189 Chronic kidney disease, unspecified: Secondary | ICD-10-CM | POA: Diagnosis not present

## 2020-01-01 DIAGNOSIS — Z5112 Encounter for antineoplastic immunotherapy: Secondary | ICD-10-CM | POA: Insufficient documentation

## 2020-01-01 DIAGNOSIS — Z85810 Personal history of malignant neoplasm of tongue: Secondary | ICD-10-CM | POA: Diagnosis not present

## 2020-01-01 DIAGNOSIS — E039 Hypothyroidism, unspecified: Secondary | ICD-10-CM | POA: Insufficient documentation

## 2020-01-01 DIAGNOSIS — Z87891 Personal history of nicotine dependence: Secondary | ICD-10-CM | POA: Insufficient documentation

## 2020-01-01 LAB — CBC WITH DIFFERENTIAL/PLATELET
Abs Immature Granulocytes: 0 10*3/uL (ref 0.00–0.07)
Basophils Absolute: 0 10*3/uL (ref 0.0–0.1)
Basophils Relative: 0 %
Eosinophils Absolute: 0.1 10*3/uL (ref 0.0–0.5)
Eosinophils Relative: 3 %
HCT: 41.2 % (ref 39.0–52.0)
Hemoglobin: 13.3 g/dL (ref 13.0–17.0)
Immature Granulocytes: 0 %
Lymphocytes Relative: 31 %
Lymphs Abs: 1.2 10*3/uL (ref 0.7–4.0)
MCH: 30.1 pg (ref 26.0–34.0)
MCHC: 32.3 g/dL (ref 30.0–36.0)
MCV: 93.2 fL (ref 80.0–100.0)
Monocytes Absolute: 0.4 10*3/uL (ref 0.1–1.0)
Monocytes Relative: 10 %
Neutro Abs: 2.2 10*3/uL (ref 1.7–7.7)
Neutrophils Relative %: 56 %
Platelets: 173 10*3/uL (ref 150–400)
RBC: 4.42 MIL/uL (ref 4.22–5.81)
RDW: 15.1 % (ref 11.5–15.5)
WBC: 3.8 10*3/uL — ABNORMAL LOW (ref 4.0–10.5)
nRBC: 0 % (ref 0.0–0.2)

## 2020-01-01 LAB — COMPREHENSIVE METABOLIC PANEL
ALT: 28 U/L (ref 0–44)
AST: 31 U/L (ref 15–41)
Albumin: 3.9 g/dL (ref 3.5–5.0)
Alkaline Phosphatase: 55 U/L (ref 38–126)
Anion gap: 9 (ref 5–15)
BUN: 25 mg/dL — ABNORMAL HIGH (ref 8–23)
CO2: 28 mmol/L (ref 22–32)
Calcium: 9.3 mg/dL (ref 8.9–10.3)
Chloride: 102 mmol/L (ref 98–111)
Creatinine, Ser: 1.97 mg/dL — ABNORMAL HIGH (ref 0.61–1.24)
GFR calc non Af Amer: 33 mL/min — ABNORMAL LOW (ref >60)
Glucose, Bld: 96 mg/dL (ref 70–99)
Potassium: 4.4 mmol/L (ref 3.5–5.1)
Sodium: 139 mmol/L (ref 135–145)
Total Bilirubin: 0.6 mg/dL (ref 0.3–1.2)
Total Protein: 7.1 g/dL (ref 6.5–8.1)

## 2020-01-01 MED ORDER — HYDROCODONE-ACETAMINOPHEN 5-325 MG PO TABS: ORAL_TABLET | ORAL | 0 refills | Status: DC

## 2020-01-01 MED ORDER — SODIUM CHLORIDE 0.9% FLUSH
10.0000 mL | INTRAVENOUS | Status: DC | PRN
  Administered 2020-01-01: 10 mL

## 2020-01-01 MED ORDER — SODIUM CHLORIDE 0.9 % IV SOLN: Freq: Once | INTRAVENOUS | Status: AC

## 2020-01-01 MED ORDER — SODIUM CHLORIDE 0.9 % IV SOLN
200.0000 mg | Freq: Once | INTRAVENOUS | Status: AC
  Administered 2020-01-01: 200 mg via INTRAVENOUS
  Filled 2020-01-01: qty 8

## 2020-01-01 MED ORDER — HEPARIN SOD (PORK) LOCK FLUSH 100 UNIT/ML IV SOLN
500.0000 [IU] | Freq: Once | INTRAVENOUS | Status: AC | PRN
  Administered 2020-01-01: 500 [IU]

## 2020-01-01 NOTE — Progress Notes (Signed)
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   07/28/2014 Initial Diagnosis   Oropharyngeal cancer   08/03/2014 Imaging   CT CAP- L supraclavicular lymphadenopathy is not completely visualized. This is better seen on the previous neck CT from 07/27/2014. Otherwise, no evidence for metastatic disease in the chest, abdomen, or pelvis.   08/03/2014 Imaging   Bone scan- Uptake at adjacent anterior LEFT 6, 7, 8 ribs likely representing trauma/fractures. Questionable nonspecific increased tracer localization at the posterior RIGHT 8th and 9th ribs, the adjacent nature which raises a a question of trauma as well   08/06/2014 Pathology Results   Dr. Benjamine Mola- Oropharynx, biopsy, Left - INVASIVE SQUAMOUS CELL CARCINOMA.   08/12/2014 Procedure   Dr. Enrique Sack- 1. Multiple extraction of tooth numbers 6, 17, 22, 23, 24, 25, 26, and 27. 3 Quadrants of alveoloplasty   08/17/2014 Pathology Results   PORT and G-TUBE placed by Dr. Carlis Stable.   08/26/2014 PET scan   Large hypermetabolic mass in the left base of tongue. Activity extends across midline to the right base tongue. 2. Intensely hypermetabolic left cervical metastatic lymph nodes. Lymph nodes extend from the left level II position to the left supraclavi   09/01/2014 - 09/22/2014 Chemotherapy   Concurrent chemoradiation with Cisplatin 100 mg/m2 x 2 cycles with Neulasta  support. Held cycle #3 d/t renal toxicity.    09/03/2014 - 10/23/2014 Radiation Therapy   Treated in [place], IMRT Isidore Moos).  Base of tongue and bilat neck. Total dose: 70 Gy in 35 fractions. (of note, he did miss several treatments requiring BID dosing towards the end of treatment).    01/25/2015 PET scan   Near complete resolution of metabolic activity at the base of tongue. Minimal residual activity is likely post treatment effect. 2. Complete resolution of metabolic activity above LEFT cervical lymph nodes. No evidence of residual metabolically active    9/62/8366 Procedure   Port-a-cath removed [name])    05/03/2018 - 08/23/2018 Chemotherapy   The patient had dexamethasone (DECADRON) 4 MG tablet, 8 mg, Oral, Daily, 1 of 1 cycle, Start date: 05/01/2018, End date: 10/23/2018 palonosetron (ALOXI) injection 0.25 mg, 0.25 mg, Intravenous,  Once, 6 of 6 cycles Administration: 0.25 mg (05/03/2018), 0.25 mg (05/24/2018), 0.25 mg (06/14/2018), 0.25 mg (07/09/2018), 0.25 mg (07/30/2018), 0.25 mg (08/21/2018) pegfilgrastim-cbqv (UDENYCA) injection 6 mg, 6 mg, Subcutaneous, Once, 5 of 5 cycles Administration: 6 mg (05/27/2018), 6 mg (06/17/2018), 6 mg (07/11/2018), 6 mg (08/01/2018), 6 mg (08/23/2018) CARBOplatin (PARAPLATIN) 380 mg in sodium chloride 0.9 % 250 mL chemo infusion, 380 mg (100 % of original dose 381 mg), Intravenous,  Once, 6 of 6 cycles Dose modification:   (original dose 381 mg, Cycle 1),   (original dose 309.5 mg, Cycle 2), 307.5 mg (original dose 309.5 mg, Cycle 5) Administration: 380 mg (05/03/2018), 310 mg (05/24/2018), 310 mg (06/14/2018), 340 mg (07/09/2018), 310 mg (07/30/2018), 350 mg (08/21/2018) PACLitaxel (TAXOL) 330 mg in sodium chloride 0.9 %  500 mL chemo infusion (> $RemoveBef'80mg'WURMUghnmo$ /m2), 175 mg/m2 = 330 mg (100 % of original dose 175 mg/m2), Intravenous,  Once, 6 of 6 cycles Dose modification: 175 mg/m2 (original dose 175 mg/m2, Cycle 1, Reason: Patient Age) Administration: 330 mg (05/03/2018), 330 mg (05/24/2018),  330 mg (06/14/2018), 330 mg (07/09/2018), 330 mg (07/30/2018), 330 mg (08/21/2018)  for chemotherapy treatment.    05/24/2018 -  Chemotherapy   The patient had pembrolizumab (KEYTRUDA) 200 mg in sodium chloride 0.9 % 50 mL chemo infusion, 200 mg, Intravenous, Once, 27 of 29 cycles Administration: 200 mg (05/24/2018), 200 mg (06/14/2018), 200 mg (07/09/2018), 200 mg (08/21/2018), 200 mg (09/11/2018), 200 mg (10/02/2018), 200 mg (10/23/2018), 200 mg (11/13/2018), 200 mg (12/04/2018), 200 mg (12/25/2018), 200 mg (01/22/2019), 200 mg (02/12/2019), 200 mg (03/05/2019), 200 mg (03/26/2019), 200 mg (04/16/2019), 200 mg (05/07/2019), 200 mg (06/03/2019), 200 mg (06/24/2019), 200 mg (07/17/2019), 200 mg (08/07/2019), 200 mg (08/28/2019), 200 mg (09/18/2019), 200 mg (10/09/2019), 200 mg (10/30/2019), 200 mg (11/20/2019), 200 mg (12/11/2019)  for chemotherapy treatment.    Squamous cell lung cancer, left ([facility])  06/14/2018 Initial Diagnosis   Squamous cell lung cancer, left ([facility])     CANCER STAGING: Cancer Staging Oropharyngeal carcinoma ([facility]) Staging form: Pharynx - Oropharynx, AJCC 7th Edition - Clinical: Stage IVA (T4a, N2b, M0) - Unsigned   INTERVAL HISTORY:  Mr. MELVILLE ENGEN, a 69 y.o. male, returns for routine follow-up and consideration for next cycle of chemotherapy. Zarin was last seen on 11/20/19.  Due for cycle #28 of pembrolizumab today.   Overall, he tells me he has been feeling pretty well. He denies having any new cough, diarrhea, itching, rashes, or new pains. His appetite   Overall, he feels ready for next cycle of chemo today.    REVIEW OF SYSTEMS:  Review of Systems  Constitutional: Negative.  Negative for appetite change, chills, fatigue and fever.  HENT:  Negative.  Negative for hearing loss, lump/mass, mouth sores and nosebleeds.   Eyes: Negative.  Negative for eye problems.  Respiratory: Negative for cough, hemoptysis and shortness of breath.   Cardiovascular: Negative.  Negative for chest pain  and leg swelling.  Gastrointestinal: Negative.  Negative for abdominal pain, blood in stool, constipation, diarrhea, nausea and vomiting.  Endocrine: Negative.  Negative for hot flashes.  Genitourinary: Negative.  Negative for bladder incontinence, difficulty urinating, dysuria, frequency and hematuria.   Musculoskeletal: Positive for arthralgias. Negative for back pain, flank pain, gait problem and myalgias.  Skin: Negative.  Negative for itching and rash.  Neurological: Negative.  Negative for dizziness, gait problem, headaches, light-headedness and numbness.  Hematological: Negative.  Negative for adenopathy.  Psychiatric/Behavioral: Negative for confusion. The patient is not nervous/anxious.     PAST MEDICAL/SURGICAL HISTORY:  Past Medical History:  Diagnosis Date  . GERD (gastroesophageal reflux disease)   . 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 Oropharyngeal cancer ([facility]) 07/28/2014   dx. 3 weeks ago.- Dr. One[facility] [place], [place].  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 BIOPSY  01/15/2018   Procedure: BIOPSY;  Surgeon: Danie Binder, MD;  [facility];  Service: Endoscopy;;  gastric  . COLONOSCOPY N/A 03/13/2016   Procedure: COLONOSCOPY;  Surgeon: Danie Binder, MD;  [facility];  Service: Endoscopy;  Laterality: N/A;  2:15 PM  .  ESOPHAGOGASTRODUODENOSCOPY (EGD) WITH PROPOFOL N/A 08/17/2014   Procedure: ESOPHAGOGASTRODUODENOSCOPY (EGD) WITH PROPOFOL (procedure #1);  Surgeon: Aviva Signs Md, MD;  Location: [facility];  Service: General;  Laterality: N/A;  . ESOPHAGOGASTRODUODENOSCOPY (EGD) WITH PROPOFOL N/A 01/15/2018   Procedure: ESOPHAGOGASTRODUODENOSCOPY (EGD) WITH PROPOFOL;  Surgeon: Danie Binder, MD;  [facility];  Service: Endoscopy;  Laterality: N/A;  9:30am  . MULTIPLE EXTRACTIONS WITH  ALVEOLOPLASTY N/A 08/12/2014   Procedure: Extraction of tooth #'s 6,17,22,23,24,25,26,27 with alveoloplasty;  Surgeon: Lenn Cal, DDS;  Location: [facility];  Service: Oral Surgery;  Laterality: N/A;  . PANENDOSCOPY N/A 08/06/2014   Procedure: PANENDOSCOPY WITH BIOPSY;  Surgeon: Leta Baptist, MD;  Location: [facility];  Service: ENT;  Laterality: N/A;  . PEG PLACEMENT Left 08/17/14  . PEG PLACEMENT N/A 08/17/2014   Procedure: PERCUTANEOUS ENDOSCOPIC GASTROSTOMY (PEG) PLACEMENT (procedure #1);  Surgeon: Aviva Signs Md, MD;  Location: [facility];  Service: General;  Laterality: N/A;  . PORT-A-CATH REMOVAL Right 07/17/2016   Procedure: MINOR REMOVAL PORT-A-CATH;  Surgeon: Aviva Signs, MD;  Location: [facility];  Service: General;  Laterality: Right;  . PORTACATH PLACEMENT Right 08/17/14  . PORTACATH PLACEMENT Right 08/17/2014   Procedure: INSERTION PORT-A-CATH (procedure #2);  Surgeon: Aviva Signs Md, MD;  Location: [facility];  Service: General;  Laterality: Right;  . PORTACATH PLACEMENT Left 04/26/2018   Procedure: INSERTION PORT-A-CATH (attached catheter in left subclavian);  Surgeon: Aviva Signs, MD;  Location: [facility];  Service: General;  Laterality: Left;  . SAVORY DILATION N/A 01/15/2018   Procedure: SAVORY DILATION;  Surgeon: Danie Binder, MD;  [facility];  Service: Endoscopy;  Laterality: N/A;  . VIDEO BRONCHOSCOPY WITH ENDOBRONCHIAL ULTRASOUND N/A 04/15/2018   Procedure: VIDEO BRONCHOSCOPY WITH ENDOBRONCHIAL ULTRASOUND;  Surgeon: Melrose Nakayama, MD;  Location: [facility];  Service: Thoracic;  Laterality: N/A;    SOCIAL HISTORY:  Social History   Socioeconomic History  . Marital status: Legally Separated    Spouse name: Not on file  . Number of children: 5  . Years of education: Not on file  . Highest education level: Not on file  Occupational History  . Not on file  Tobacco Use  . Smoking status: Former Smoker    Packs/day: 0.50    Years: 30.00    Pack years:  15.00    Quit date: 07/22/2014    Years since quitting: 5.4  . Smokeless tobacco: Never Used  Substance and Sexual Activity  . Alcohol use: Not Currently    Alcohol/week: 0.0 standard drinks    Comment: None currently (11/09/17); previously 1-2 beers on the weekend  . Drug use: No  . Sexual activity: Not on file  Other Topics Concern  . Not on file  Social History Narrative  . Not on file   Social Determinants of Health   Financial Resource Strain:   . Difficulty of Paying Living Expenses: Not on file  Food Insecurity:   . Worried About Charity fundraiser in the Last Year: Not on file  . Ran Out of Food in the Last Year: Not on file  Transportation Needs:   . Lack of Transportation (Medical): Not on file  . Lack of Transportation (Non-Medical): Not on file  Physical Activity:   . Days of Exercise per Week: Not on file  . Minutes of Exercise per Session: Not on file  Stress:   . Feeling of Stress : Not on file  Social Connections:   .  Frequency of Communication with Friends and Family: Not on file  . Frequency of Social Gatherings with Friends and Family: Not on file  . Attends Religious Services: Not on file  . Active Member of Clubs or Organizations: Not on file  . Attends Archivist Meetings: Not on file  . Marital Status: Not on file  Intimate Partner Violence:   . Fear of Current or Ex-Partner: Not on file  . Emotionally Abused: Not on file  . Physically Abused: Not on file  . Sexually Abused: Not on file    FAMILY HISTORY:  Family History  Problem Relation Age of Onset  . Colon cancer Neg Hx   . Gastric cancer Neg Hx   . Esophageal cancer Neg Hx     CURRENT MEDICATIONS:  Current Outpatient Medications  Medication Sig Dispense Refill  . feeding supplement, ENSURE ENLIVE, (ENSURE ENLIVE) LIQD Take 237 mLs by mouth 4 (four) times daily.     Marland Kitchen HYDROcodone-acetaminophen (NORCO/VICODIN) 5-325 MG tablet TAKE 1 TABLET EVERY 12 HOURS AS NEEDED FOR MODERATE  PAIN 30 tablet 0  . levothyroxine (SYNTHROID) 88 MCG tablet Take 1 tablet (88 mcg total) by mouth daily before breakfast. 30 tablet 3  . Melatonin 10 MG TABS Take 1 tablet by mouth at bedtime.    . naproxen sodium (ALEVE) 220 MG tablet Take 220 mg by mouth daily as needed.     Marland Kitchen omeprazole (PRILOSEC) 20 MG capsule TAKE 1 CAPSULE BY MOUTH 30 MINUTES PRIOR TO BREAKFAST 90 capsule 3  . Pembrolizumab (KEYTRUDA IV) Inject into the vein every 21 ( twenty-one) days.     No current facility-administered medications for this visit.   Facility-Administered Medications Ordered in Other Visits  Medication Dose Route Frequency Provider Last Rate Last Admin  . sodium chloride flush (NS) 0.9 % injection 10 mL  10 mL Intracatheter PRN Derek Jack, MD   10 mL at 06/24/19 7846    ALLERGIES:  No Known Allergies  PHYSICAL EXAM:  Performance status (ECOG): 1 - Symptomatic but completely ambulatory  There were no vitals filed for this visit. Wt Readings from Last 3 Encounters:  12/11/19 138 lb (62.6 kg)  11/20/19 140 lb 9.6 oz (63.8 kg)  10/30/19 136 lb 9.6 oz (62 kg)   Physical Exam Vitals reviewed.  Constitutional:      Appearance: Normal appearance.  Cardiovascular:     Rate and Rhythm: Normal rate and regular rhythm.     Heart sounds: Normal heart sounds.  Pulmonary:     Effort: Pulmonary effort is normal.     Breath sounds: Normal breath sounds.  Chest:     Comments: Port-a-Cath in L chest Neurological:     General: No focal deficit present.     Mental Status: He is alert and oriented to person, place, and time.  Psychiatric:        Mood and Affect: Mood normal.        Behavior: Behavior normal.     LABORATORY DATA:  I have reviewed the labs as listed.  CBC Latest Ref Rng & Units 12/11/2019 11/20/2019 10/30/2019  WBC 4.0 - 10.5 K/uL 4.2 4.6 4.2  Hemoglobin 13.0 - 17.0 g/dL 12.7(L) 12.9(L) 12.8(L)  Hematocrit 39 - 52 % 40.5 39.8 39.2  Platelets 150 - 400 K/uL 169 170 179    CMP Latest Ref Rng & Units 12/11/2019 11/20/2019 10/30/2019  Glucose 70 - 99 mg/dL 141(H) 90 81  BUN 8 - 23 mg/dL 32(H) 29(H) 32(H)  Creatinine 0.61 - 1.24 mg/dL 2.12(H) 1.71(H) 2.10(H)  Sodium 135 - 145 mmol/L 137 140 141  Potassium 3.5 - 5.1 mmol/L 4.5 4.4 4.4  Chloride 98 - 111 mmol/L 102 105 103  CO2 22 - 32 mmol/L $RemoveB'25 26 25  'brPjKQwQ$ Calcium 8.9 - 10.3 mg/dL 9.1 9.1 9.2  Total Protein 6.5 - 8.1 g/dL 7.1 6.9 7.3  Total Bilirubin 0.3 - 1.2 mg/dL 0.7 0.6 0.4  Alkaline Phos 38 - 126 U/L 46 44 56  AST 15 - 41 U/L $Remo'24 20 23  'VSQeB$ ALT 0 - 44 U/L $Remo'22 22 18   'aKfJz$ Lab Results  Component Value Date   LDH 103 10/09/2019   LDH 127 06/14/2018   LDH 107 05/24/2018    DIAGNOSTIC IMAGING:  I have independently reviewed the scans and discussed with the patient. No results found.   ASSESSMENT:  1. 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 -6 cycles of carboplatin, paclitaxel and pembrolizumab from 05/03/2018 through 08/21/2018. -Maintenance pembrolizumab started on 09/11/2018. -PET scan on 09/15/2019 showed interval decrease in hypermetabolic areas associated with tongue and floor of the mouth.  Hypermetabolic metastatic lymphadenopathy in the chest is stable.  No new sites seen.  2. 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   PLAN:  1. 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 -He reportedly hurt his back while trying to help his daughter move. -I reviewed his labs.  LFTs are normal.  CBC shows normal white count and platelets. -We will proceed with his Keytruda today and in 3 weeks.  We will see him back in 6 weeks.  Scans in 6 months.  2. Hypothyroidism: -Continue Synthroid.  Last TSH was 2.2.  3. Stage IVa base of the tongue squamous cell  carcinoma: -Recent PET scan did not show any evidence of recurrence.  4. Bilateral knee pains: -Well controlled with hydrocodone as needed.   5. Nutrition: -Continue boost every day.  6.  CKD: -His creatinine today is 1.97 (2.12). Increase hydration.    Orders placed this encounter:  No orders of the defined types were placed in this encounter.  Greater than 50% was spent in counseling and coordination of care with this patient including but not limited to discussion of the relevant topics above (See A&P) including, but not limited to diagnosis and management of acute and chronic medical conditions.     Faythe Casa, NP 01/02/2020 2:32 PM  [facility] [phone]

## 2020-01-01 NOTE — Patient Instructions (Signed)
[facility] Discharge Instructions  Labs drawn from portacath today   Thank you for choosing [facility] at [facility] to provide your oncology and hematology care.  To afford each patient quality time with our provider, please arrive at least 15 minutes before your scheduled appointment time.   If you have a lab appointment with the Cancer Center please come in thru the Main Entrance and check in at the main information desk.  You need to re-schedule your appointment should you arrive 10 or more minutes late.  We strive to give you quality time with our providers, and arriving late affects you and other patients whose appointments are after yours.  Also, if you no show three or more times for appointments you may be dismissed from the clinic at the providers discretion.     Again, thank you for choosing [facility].  Our hope is that these requests will decrease the amount of time that you wait before being seen by our physicians.       _____________________________________________________________  Should you have questions after your visit to [facility], please contact our office at [phone] and follow the prompts.  Our office hours are 8:00 a.m. and 4:30 p.m. Monday - Friday.  Please note that voicemails left after 4:00 p.m. may not be returned until the following business day.  We are closed weekends and major holidays.  You do have access to a nurse 24-7, just call the main number to the clinic [phone] and do not press any options, hold on the line and a nurse will answer the phone.    For prescription refill requests, have your pharmacy contact our office and allow 72 hours.    Due to Covid, you will need to wear a mask upon entering the hospital. If you do not have a mask, a mask will be given to you at the Main Entrance upon arrival. For doctor visits, patients may have 1 support person age 18  or older with them. For treatment visits, patients can not have anyone with them due to social distancing guidelines and our immunocompromised population.     

## 2020-01-01 NOTE — Progress Notes (Signed)
Pt seen by Lorretta Harp NP, okay to treat today.  Tolerated treatment well today without incidence.  Vital signs stable prior to Discharge.  Discharged in stable condition ambulatory.

## 2020-01-01 NOTE — Patient Instructions (Signed)
[facility] Discharge Instructions for Patients Receiving Chemotherapy  Today you received the following chemotherapy agents   To help prevent nausea and vomiting after your treatment, we encourage you to take your nausea medication   If you develop nausea and vomiting that is not controlled by your nausea medication, call the clinic.   BELOW ARE SYMPTOMS THAT SHOULD BE REPORTED IMMEDIATELY:  *FEVER GREATER THAN 100.5 F  *CHILLS WITH OR WITHOUT FEVER  NAUSEA AND VOMITING THAT IS NOT CONTROLLED WITH YOUR NAUSEA MEDICATION  *UNUSUAL SHORTNESS OF BREATH  *UNUSUAL BRUISING OR BLEEDING  TENDERNESS IN MOUTH AND THROAT WITH OR WITHOUT PRESENCE OF ULCERS  *URINARY PROBLEMS  *BOWEL PROBLEMS  UNUSUAL RASH Items with * indicate a potential emergency and should be followed up as soon as possible.  Feel free to call the clinic should you have any questions or concerns. The clinic phone number is [phone].  Please show the CHEMO ALERT CARD at check-in to the Emergency Department and triage nurse.   

## 2020-01-22 ENCOUNTER — Other Ambulatory Visit: Payer: Self-pay

## 2020-01-22 ENCOUNTER — Inpatient Hospital Stay (HOSPITAL_COMMUNITY): Payer: Medicare HMO

## 2020-01-22 VITALS — BP 108/60 | HR 69 | Temp 97.0°F | Resp 18 | Wt 138.2 lb

## 2020-01-22 VITALS — BP 102/81 | HR 64

## 2020-01-22 DIAGNOSIS — C109 Malignant neoplasm of oropharynx, unspecified: Secondary | ICD-10-CM

## 2020-01-22 DIAGNOSIS — Z5112 Encounter for antineoplastic immunotherapy: Secondary | ICD-10-CM | POA: Diagnosis not present

## 2020-01-22 DIAGNOSIS — N189 Chronic kidney disease, unspecified: Secondary | ICD-10-CM | POA: Diagnosis not present

## 2020-01-22 DIAGNOSIS — Z87891 Personal history of nicotine dependence: Secondary | ICD-10-CM | POA: Diagnosis not present

## 2020-01-22 DIAGNOSIS — C3492 Malignant neoplasm of unspecified part of left bronchus or lung: Secondary | ICD-10-CM

## 2020-01-22 DIAGNOSIS — M25561 Pain in right knee: Secondary | ICD-10-CM | POA: Diagnosis not present

## 2020-01-22 DIAGNOSIS — Z9221 Personal history of antineoplastic chemotherapy: Secondary | ICD-10-CM | POA: Diagnosis not present

## 2020-01-22 DIAGNOSIS — M25562 Pain in left knee: Secondary | ICD-10-CM | POA: Diagnosis not present

## 2020-01-22 DIAGNOSIS — E039 Hypothyroidism, unspecified: Secondary | ICD-10-CM | POA: Diagnosis not present

## 2020-01-22 DIAGNOSIS — Z85810 Personal history of malignant neoplasm of tongue: Secondary | ICD-10-CM | POA: Diagnosis not present

## 2020-01-22 DIAGNOSIS — Z79899 Other long term (current) drug therapy: Secondary | ICD-10-CM | POA: Diagnosis not present

## 2020-01-22 LAB — CBC WITH DIFFERENTIAL/PLATELET
Abs Immature Granulocytes: 0 10*3/uL (ref 0.00–0.07)
Basophils Absolute: 0 10*3/uL (ref 0.0–0.1)
Basophils Relative: 0 %
Eosinophils Absolute: 0.1 10*3/uL (ref 0.0–0.5)
Eosinophils Relative: 2 %
HCT: 40.5 % (ref 39.0–52.0)
Hemoglobin: 13 g/dL (ref 13.0–17.0)
Immature Granulocytes: 0 %
Lymphocytes Relative: 31 %
Lymphs Abs: 1 10*3/uL (ref 0.7–4.0)
MCH: 30.3 pg (ref 26.0–34.0)
MCHC: 32.1 g/dL (ref 30.0–36.0)
MCV: 94.4 fL (ref 80.0–100.0)
Monocytes Absolute: 0.3 10*3/uL (ref 0.1–1.0)
Monocytes Relative: 10 %
Neutro Abs: 1.8 10*3/uL (ref 1.7–7.7)
Neutrophils Relative %: 57 %
Platelets: 164 10*3/uL (ref 150–400)
RBC: 4.29 MIL/uL (ref 4.22–5.81)
RDW: 15.1 % (ref 11.5–15.5)
WBC: 3.1 10*3/uL — ABNORMAL LOW (ref 4.0–10.5)
nRBC: 0 % (ref 0.0–0.2)

## 2020-01-22 LAB — COMPREHENSIVE METABOLIC PANEL
ALT: 23 U/L (ref 0–44)
AST: 26 U/L (ref 15–41)
Albumin: 3.9 g/dL (ref 3.5–5.0)
Alkaline Phosphatase: 46 U/L (ref 38–126)
Anion gap: 9 (ref 5–15)
BUN: 31 mg/dL — ABNORMAL HIGH (ref 8–23)
CO2: 25 mmol/L (ref 22–32)
Calcium: 9 mg/dL (ref 8.9–10.3)
Chloride: 103 mmol/L (ref 98–111)
Creatinine, Ser: 1.63 mg/dL — ABNORMAL HIGH (ref 0.61–1.24)
GFR, Estimated: 45 mL/min — ABNORMAL LOW (ref >60)
Glucose, Bld: 176 mg/dL — ABNORMAL HIGH (ref 70–99)
Potassium: 4.2 mmol/L (ref 3.5–5.1)
Sodium: 137 mmol/L (ref 135–145)
Total Bilirubin: 0.6 mg/dL (ref 0.3–1.2)
Total Protein: 6.9 g/dL (ref 6.5–8.1)

## 2020-01-22 LAB — TSH: TSH: 4.307 u[IU]/mL (ref 0.350–4.500)

## 2020-01-22 MED ORDER — SODIUM CHLORIDE 0.9% FLUSH
10.0000 mL | INTRAVENOUS | Status: DC | PRN
  Administered 2020-01-22: 10 mL

## 2020-01-22 MED ORDER — SODIUM CHLORIDE 0.9 % IV SOLN: Freq: Once | INTRAVENOUS | Status: AC

## 2020-01-22 MED ORDER — SODIUM CHLORIDE 0.9 % IV SOLN
200.0000 mg | Freq: Once | INTRAVENOUS | Status: AC
  Administered 2020-01-22: 200 mg via INTRAVENOUS
  Filled 2020-01-22: qty 8

## 2020-01-22 MED ORDER — HEPARIN SOD (PORK) LOCK FLUSH 100 UNIT/ML IV SOLN
500.0000 [IU] | Freq: Once | INTRAVENOUS | Status: AC | PRN
  Administered 2020-01-22: 500 [IU]

## 2020-01-22 NOTE — Progress Notes (Signed)
Alexander [name] presents today for DC1 27 Keytruda. Pt denies any new changes or symptoms since last treatment. Lab results and vitals have been reviewed, including Cr 1.63, which is pts baseline . Proceeding with treatment today as planned.  Infusions tolerated without incident or complaint. VSS upon completion of treatment. Port flushed and deaccessed per protocol, see MAR and IV flowsheet for details. Discharged in satisfactory condition with follow up instructions.

## 2020-01-22 NOTE — Patient Instructions (Signed)
[facility] Discharge Instructions for Patients Receiving Chemotherapy   Beginning January 23rd 2017 lab work for the [facility] will be done in the  Main lab at [facility] on 1st floor. If you have a lab appointment with the [facility] please come in thru the  Main Entrance and check in at the main information desk   Today you received the following chemotherapy agents Keytruda  To help prevent nausea and vomiting after your treatment, we encourage you to take your nausea medication If you develop nausea and vomiting, or diarrhea that is not controlled by your medication, call the clinic.  The clinic phone number is [phone]. Office hours are Monday-Friday 8:30am-5:00pm.  BELOW ARE SYMPTOMS THAT SHOULD BE REPORTED IMMEDIATELY:  *FEVER GREATER THAN 101.0 F  *CHILLS WITH OR WITHOUT FEVER  NAUSEA AND VOMITING THAT IS NOT CONTROLLED WITH YOUR NAUSEA MEDICATION  *UNUSUAL SHORTNESS OF BREATH  *UNUSUAL BRUISING OR BLEEDING  TENDERNESS IN MOUTH AND THROAT WITH OR WITHOUT PRESENCE OF ULCERS  *URINARY PROBLEMS  *BOWEL PROBLEMS  UNUSUAL RASH Items with * indicate a potential emergency and should be followed up as soon as possible. If you have an emergency after office hours please contact your primary care physician or go to the nearest emergency department.  Please call the clinic during office hours if you have any questions or concerns.   You may also contact the Patient Navigator at [phone] should you have any questions or need assistance in obtaining follow up care.      Resources For Cancer Patients and their Caregivers ? American Cancer Society: Can assist with transportation, wigs, general needs, runs Look Good Feel Better.        ([phone] ? Cancer Care: Provides financial assistance, online support groups, medication/co-pay assistance.  1-800-813-HOPE 209-416-0809) ? [employer] Assists [place] cancer  patients and their families through emotional , educational and financial support.  (458)303-4861 ? [place] DSS Where to apply for food stamps, Medicaid and utility assistance. 301-158-9413 ? [facility]: Transportation to medical appointments. (770)097-8164 ? Social Security Administration: May apply for disability if have a Stage IV cancer. 856-641-9358 628-825-8374 ? [employer], Disability and Transit Services: Assists with nutrition, care and transit needs. 361-454-9050

## 2020-01-22 NOTE — Patient Instructions (Signed)
[facility] at [facility] Discharge Instructions  Labs drawn from portacath today   Thank you for choosing [facility] at [facility] to provide your oncology and hematology care.  To afford each patient quality time with our provider, please arrive at least 15 minutes before your scheduled appointment time.   If you have a lab appointment with the Cancer Center please come in thru the Main Entrance and check in at the main information desk.  You need to re-schedule your appointment should you arrive 10 or more minutes late.  We strive to give you quality time with our providers, and arriving late affects you and other patients whose appointments are after yours.  Also, if you no show three or more times for appointments you may be dismissed from the clinic at the providers discretion.     Again, thank you for choosing [facility].  Our hope is that these requests will decrease the amount of time that you wait before being seen by our physicians.       _____________________________________________________________  Should you have questions after your visit to [facility], please contact our office at [phone] and follow the prompts.  Our office hours are 8:00 a.m. and 4:30 p.m. Monday - Friday.  Please note that voicemails left after 4:00 p.m. may not be returned until the following business day.  We are closed weekends and major holidays.  You do have access to a nurse 24-7, just call the main number to the clinic [phone] and do not press any options, hold on the line and a nurse will answer the phone.    For prescription refill requests, have your pharmacy contact our office and allow 72 hours.    Due to Covid, you will need to wear a mask upon entering the hospital. If you do not have a mask, a mask will be given to you at the Main Entrance upon arrival. For doctor visits, patients may have 1 support person age 18  or older with them. For treatment visits, patients can not have anyone with them due to social distancing guidelines and our immunocompromised population.     

## 2020-02-12 ENCOUNTER — Inpatient Hospital Stay (HOSPITAL_COMMUNITY): Payer: Medicare HMO

## 2020-02-12 ENCOUNTER — Other Ambulatory Visit (HOSPITAL_COMMUNITY): Payer: Self-pay

## 2020-02-12 ENCOUNTER — Inpatient Hospital Stay (HOSPITAL_COMMUNITY): Payer: Medicare HMO | Attending: Hematology

## 2020-02-12 ENCOUNTER — Inpatient Hospital Stay (HOSPITAL_BASED_OUTPATIENT_CLINIC_OR_DEPARTMENT_OTHER): Payer: Medicare HMO | Admitting: Hematology

## 2020-02-12 ENCOUNTER — Encounter (HOSPITAL_COMMUNITY): Payer: Self-pay

## 2020-02-12 ENCOUNTER — Other Ambulatory Visit: Payer: Self-pay

## 2020-02-12 VITALS — BP 102/55 | HR 68 | Temp 96.9°F | Resp 18

## 2020-02-12 VITALS — BP 105/56 | HR 60 | Temp 97.2°F | Resp 18

## 2020-02-12 DIAGNOSIS — N189 Chronic kidney disease, unspecified: Secondary | ICD-10-CM | POA: Insufficient documentation

## 2020-02-12 DIAGNOSIS — M25561 Pain in right knee: Secondary | ICD-10-CM | POA: Diagnosis not present

## 2020-02-12 DIAGNOSIS — C109 Malignant neoplasm of oropharynx, unspecified: Secondary | ICD-10-CM

## 2020-02-12 DIAGNOSIS — Z87891 Personal history of nicotine dependence: Secondary | ICD-10-CM | POA: Diagnosis not present

## 2020-02-12 DIAGNOSIS — Z923 Personal history of irradiation: Secondary | ICD-10-CM | POA: Insufficient documentation

## 2020-02-12 DIAGNOSIS — Z9221 Personal history of antineoplastic chemotherapy: Secondary | ICD-10-CM | POA: Diagnosis not present

## 2020-02-12 DIAGNOSIS — C3492 Malignant neoplasm of unspecified part of left bronchus or lung: Secondary | ICD-10-CM

## 2020-02-12 DIAGNOSIS — Z85810 Personal history of malignant neoplasm of tongue: Secondary | ICD-10-CM | POA: Insufficient documentation

## 2020-02-12 DIAGNOSIS — M25562 Pain in left knee: Secondary | ICD-10-CM | POA: Insufficient documentation

## 2020-02-12 DIAGNOSIS — E039 Hypothyroidism, unspecified: Secondary | ICD-10-CM | POA: Insufficient documentation

## 2020-02-12 DIAGNOSIS — Z5112 Encounter for antineoplastic immunotherapy: Secondary | ICD-10-CM | POA: Diagnosis not present

## 2020-02-12 DIAGNOSIS — Z79899 Other long term (current) drug therapy: Secondary | ICD-10-CM | POA: Diagnosis not present

## 2020-02-12 LAB — CBC WITH DIFFERENTIAL/PLATELET
Abs Immature Granulocytes: 0.01 10*3/uL (ref 0.00–0.07)
Basophils Absolute: 0 10*3/uL (ref 0.0–0.1)
Basophils Relative: 0 %
Eosinophils Absolute: 0.2 10*3/uL (ref 0.0–0.5)
Eosinophils Relative: 3 %
HCT: 39.9 % (ref 39.0–52.0)
Hemoglobin: 13 g/dL (ref 13.0–17.0)
Immature Granulocytes: 0 %
Lymphocytes Relative: 18 %
Lymphs Abs: 0.9 10*3/uL (ref 0.7–4.0)
MCH: 30.6 pg (ref 26.0–34.0)
MCHC: 32.6 g/dL (ref 30.0–36.0)
MCV: 93.9 fL (ref 80.0–100.0)
Monocytes Absolute: 0.5 10*3/uL (ref 0.1–1.0)
Monocytes Relative: 9 %
Neutro Abs: 3.5 10*3/uL (ref 1.7–7.7)
Neutrophils Relative %: 70 %
Platelets: 186 10*3/uL (ref 150–400)
RBC: 4.25 MIL/uL (ref 4.22–5.81)
RDW: 15 % (ref 11.5–15.5)
WBC: 5 10*3/uL (ref 4.0–10.5)
nRBC: 0 % (ref 0.0–0.2)

## 2020-02-12 LAB — TSH: TSH: 2.189 u[IU]/mL (ref 0.350–4.500)

## 2020-02-12 LAB — COMPREHENSIVE METABOLIC PANEL
ALT: 19 U/L (ref 0–44)
AST: 25 U/L (ref 15–41)
Albumin: 4 g/dL (ref 3.5–5.0)
Alkaline Phosphatase: 48 U/L (ref 38–126)
Anion gap: 10 (ref 5–15)
BUN: 28 mg/dL — ABNORMAL HIGH (ref 8–23)
CO2: 24 mmol/L (ref 22–32)
Calcium: 9.3 mg/dL (ref 8.9–10.3)
Chloride: 105 mmol/L (ref 98–111)
Creatinine, Ser: 1.78 mg/dL — ABNORMAL HIGH (ref 0.61–1.24)
GFR, Estimated: 41 mL/min — ABNORMAL LOW (ref >60)
Glucose, Bld: 131 mg/dL — ABNORMAL HIGH (ref 70–99)
Potassium: 4 mmol/L (ref 3.5–5.1)
Sodium: 139 mmol/L (ref 135–145)
Total Bilirubin: 0.9 mg/dL (ref 0.3–1.2)
Total Protein: 7 g/dL (ref 6.5–8.1)

## 2020-02-12 MED ORDER — HEPARIN SOD (PORK) LOCK FLUSH 100 UNIT/ML IV SOLN
500.0000 [IU] | Freq: Once | INTRAVENOUS | Status: AC | PRN
  Administered 2020-02-12: 500 [IU]

## 2020-02-12 MED ORDER — SODIUM CHLORIDE 0.9 % IV SOLN
200.0000 mg | Freq: Once | INTRAVENOUS | Status: AC
  Administered 2020-02-12: 200 mg via INTRAVENOUS
  Filled 2020-02-12: qty 8

## 2020-02-12 MED ORDER — SODIUM CHLORIDE 0.9% FLUSH
10.0000 mL | INTRAVENOUS | Status: DC | PRN
  Administered 2020-02-12: 10 mL

## 2020-02-12 MED ORDER — SODIUM CHLORIDE 0.9 % IV SOLN: Freq: Once | INTRAVENOUS | Status: AC

## 2020-02-12 MED ORDER — HYDROCODONE-ACETAMINOPHEN 5-325 MG PO TABS: ORAL_TABLET | ORAL | 0 refills | Status: DC

## 2020-02-12 NOTE — Progress Notes (Signed)
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   07/28/2014 Initial Diagnosis   Oropharyngeal cancer   08/03/2014 Imaging   CT CAP- L supraclavicular lymphadenopathy is not completely visualized. This is better seen on the previous neck CT from 07/27/2014. Otherwise, no evidence for metastatic disease in the chest, abdomen, or pelvis.   08/03/2014 Imaging   Bone scan- Uptake at adjacent anterior LEFT 6, 7, 8 ribs likely representing trauma/fractures. Questionable nonspecific increased tracer localization at the posterior RIGHT 8th and 9th ribs, the adjacent nature which raises a a question of trauma as well   08/06/2014 Pathology Results   Dr. Benjamine Mola- Oropharynx, biopsy, Left - INVASIVE SQUAMOUS CELL CARCINOMA.   08/12/2014 Procedure   Dr. Enrique Sack- 1. Multiple extraction of tooth numbers 6, 17, 22, 23, 24, 25, 26, and 27. 3 Quadrants of alveoloplasty   08/17/2014 Pathology Results   PORT and G-TUBE placed by Dr. Carlis Stable.   08/26/2014 PET scan   Large hypermetabolic mass in the left base of tongue. Activity extends across midline to the right base tongue. 2. Intensely hypermetabolic left cervical metastatic lymph nodes. Lymph nodes extend from the left level II position to the left supraclavi   09/01/2014 - 09/22/2014 Chemotherapy   Concurrent chemoradiation with Cisplatin 100 mg/m2 x 2 cycles with Neulasta  support. Held cycle #3 d/t renal toxicity.    09/03/2014 - 10/23/2014 Radiation Therapy   Treated in [place], IMRT Isidore Moos).  Base of tongue and bilat neck. Total dose: 70 Gy in 35 fractions. (of note, he did miss several treatments requiring BID dosing towards the end of treatment).    01/25/2015 PET scan   Near complete resolution of metabolic activity at the base of tongue. Minimal residual activity is likely post treatment effect. 2. Complete resolution of metabolic activity above LEFT cervical lymph nodes. No evidence of residual metabolically active    9/62/8366 Procedure   Port-a-cath removed [name])    05/03/2018 - 08/23/2018 Chemotherapy   The patient had dexamethasone (DECADRON) 4 MG tablet, 8 mg, Oral, Daily, 1 of 1 cycle, Start date: 05/01/2018, End date: 10/23/2018 palonosetron (ALOXI) injection 0.25 mg, 0.25 mg, Intravenous,  Once, 6 of 6 cycles Administration: 0.25 mg (05/03/2018), 0.25 mg (05/24/2018), 0.25 mg (06/14/2018), 0.25 mg (07/09/2018), 0.25 mg (07/30/2018), 0.25 mg (08/21/2018) pegfilgrastim-cbqv (UDENYCA) injection 6 mg, 6 mg, Subcutaneous, Once, 5 of 5 cycles Administration: 6 mg (05/27/2018), 6 mg (06/17/2018), 6 mg (07/11/2018), 6 mg (08/01/2018), 6 mg (08/23/2018) CARBOplatin (PARAPLATIN) 380 mg in sodium chloride 0.9 % 250 mL chemo infusion, 380 mg (100 % of original dose 381 mg), Intravenous,  Once, 6 of 6 cycles Dose modification:   (original dose 381 mg, Cycle 1),   (original dose 309.5 mg, Cycle 2), 307.5 mg (original dose 309.5 mg, Cycle 5) Administration: 380 mg (05/03/2018), 310 mg (05/24/2018), 310 mg (06/14/2018), 340 mg (07/09/2018), 310 mg (07/30/2018), 350 mg (08/21/2018) PACLitaxel (TAXOL) 330 mg in sodium chloride 0.9 %  500 mL chemo infusion (> $RemoveBef'80mg'NhnykCMtpM$ /m2), 175 mg/m2 = 330 mg (100 % of original dose 175 mg/m2), Intravenous,  Once, 6 of 6 cycles Dose modification: 175 mg/m2 (original dose 175 mg/m2, Cycle 1, Reason: Patient Age) Administration: 330 mg (05/03/2018), 330 mg (05/24/2018),  330 mg (06/14/2018), 330 mg (07/09/2018), 330 mg (07/30/2018), 330 mg (08/21/2018)  for chemotherapy treatment.    05/24/2018 -  Chemotherapy   The patient had pembrolizumab (KEYTRUDA) 200 mg in sodium chloride 0.9 % 50 mL chemo infusion, 200 mg, Intravenous, Once, 29 of 33 cycles Administration: 200 mg (05/24/2018), 200 mg (06/14/2018), 200 mg (07/09/2018), 200 mg (08/21/2018), 200 mg (09/11/2018), 200 mg (10/02/2018), 200 mg (10/23/2018), 200 mg (11/13/2018), 200 mg (12/04/2018), 200 mg (12/25/2018), 200 mg (01/22/2019), 200 mg (02/12/2019), 200 mg (03/05/2019), 200 mg (03/26/2019), 200 mg (04/16/2019), 200 mg (05/07/2019), 200 mg (06/03/2019), 200 mg (06/24/2019), 200 mg (07/17/2019), 200 mg (08/07/2019), 200 mg (08/28/2019), 200 mg (09/18/2019), 200 mg (10/09/2019), 200 mg (10/30/2019), 200 mg (11/20/2019), 200 mg (12/11/2019), 200 mg (01/01/2020), 200 mg (01/22/2020)  for chemotherapy treatment.    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 male, returns for routine follow-up and consideration for next cycle of immunotherapy. Alexander [name] was last seen on 11/20/2019.  Due for cycle #30 of Keytruda today.   Overall, he tells me he has been feeling okay. He complains of having pains in his knees from arthritis which worsened when the temperature cooled down. He denies having skin rashes, diarrhea or SOB. His appetite is good.  Overall, he feels ready for next cycle of immunotherapy today.    REVIEW OF SYSTEMS:  Review of Systems  Constitutional: Positive for appetite change (50%) and fatigue (50%).  Respiratory: Negative for shortness of breath.   Gastrointestinal: Negative for diarrhea.  Musculoskeletal: Positive for arthralgias (10/10 L  knee pain).  Skin: Negative for rash.  All other systems reviewed and are negative.   PAST MEDICAL/SURGICAL HISTORY:  Past Medical History:  Diagnosis Date  . GERD (gastroesophageal reflux disease)   . 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 Oropharyngeal cancer ([place]) 07/28/2014   dx. 3 weeks ago.- Dr. Oneal [facility] [place], [place].  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 BIOPSY  01/15/2018   Procedure: BIOPSY;  Surgeon: Danie Binder, MD;  [facility];  Service: Endoscopy;;  gastric  . COLONOSCOPY N/A 03/13/2016   Procedure: COLONOSCOPY;  Surgeon: Danie Binder, MD;  [facility];  Service: Endoscopy;  Laterality: N/A;  2:15 PM  . ESOPHAGOGASTRODUODENOSCOPY (EGD) WITH PROPOFOL N/A 08/17/2014   Procedure: ESOPHAGOGASTRODUODENOSCOPY (EGD) WITH PROPOFOL (procedure #1);  Surgeon: Aviva Signs Md, MD;  Location: [facility];  Service: General;  Laterality: N/A;  . ESOPHAGOGASTRODUODENOSCOPY (EGD) WITH PROPOFOL N/A 01/15/2018   Procedure: ESOPHAGOGASTRODUODENOSCOPY (EGD) WITH PROPOFOL;  Surgeon: Danie Binder, MD;  [facility];  Service: Endoscopy;  Laterality: N/A;  9:30am  . MULTIPLE EXTRACTIONS WITH ALVEOLOPLASTY N/A 08/12/2014   Procedure: Extraction of tooth #'s 6,17,22,23,24,25,26,27 with alveoloplasty;  Surgeon: Lenn Cal,  DDS;  Location: [facility];  Service: Oral Surgery;  Laterality: N/A;  . PANENDOSCOPY N/A 08/06/2014   Procedure: PANENDOSCOPY WITH BIOPSY;  Surgeon: Leta Baptist, MD;  Location: [facility];  Service: ENT;  Laterality: N/A;  . PEG PLACEMENT Left 08/17/14  . PEG PLACEMENT N/A 08/17/2014   Procedure: PERCUTANEOUS ENDOSCOPIC GASTROSTOMY (PEG) PLACEMENT (procedure #1);  Surgeon: Aviva Signs Md, MD;  Location: [facility];  Service: General;  Laterality: N/A;  . PORT-A-CATH REMOVAL  Right 07/17/2016   Procedure: MINOR REMOVAL PORT-A-CATH;  Surgeon: Aviva Signs, MD;  Location: [facility];  Service: General;  Laterality: Right;  . PORTACATH PLACEMENT Right 08/17/14  . PORTACATH PLACEMENT Right 08/17/2014   Procedure: INSERTION PORT-A-CATH (procedure #2);  Surgeon: Aviva Signs Md, MD;  Location: [facility];  Service: General;  Laterality: Right;  . PORTACATH PLACEMENT Left 04/26/2018   Procedure: INSERTION PORT-A-CATH (attached catheter in left subclavian);  Surgeon: Aviva Signs, MD;  Location[facility];  Service: General;  Laterality: Left;  . SAVORY DILATION N/A 01/15/2018   Procedure: SAVORY DILATION;  Surgeon: Danie Binder, MD;  [facility];  Service: Endoscopy;  Laterality: N/A;  . VIDEO BRONCHOSCOPY WITH ENDOBRONCHIAL ULTRASOUND N/A 04/15/2018   Procedure: VIDEO BRONCHOSCOPY WITH ENDOBRONCHIAL ULTRASOUND;  Surgeon: Melrose Nakayama, MD;  Location: [facility];  Service: Thoracic;  Laterality: N/A;    SOCIAL HISTORY:  Social History   Socioeconomic History  . Marital status: Legally Separated    Spouse name: Not on file  . Number of children: 5  . Years of education: Not on file  . Highest education level: Not on file  Occupational History  . Not on file  Tobacco Use  . Smoking status: Former Smoker    Packs/day: 0.50    Years: 30.00    Pack years: 15.00    Quit date: 07/22/2014    Years since quitting: 5.5  . Smokeless tobacco: Never Used  Substance and Sexual Activity  . Alcohol use: Not Currently    Alcohol/week: 0.0 standard drinks    Comment: None currently (11/09/17); previously 1-2 beers on the weekend  . Drug use: No  . Sexual activity: Not on file  Other Topics Concern  . Not on file  Social History Narrative  . Not on file   Social Determinants of Health   Financial Resource Strain: Low Risk   . Difficulty of Paying Living Expenses: Not hard at all  Food Insecurity: No Food Insecurity  . Worried About Charity fundraiser in the Last  Year: Never true  . Ran Out of Food in the Last Year: Never true  Transportation Needs: No Transportation Needs  . Lack of Transportation (Medical): No  . Lack of Transportation (Non-Medical): No  Physical Activity: Inactive  . Days of Exercise per Week: 0 days  . Minutes of Exercise per Session: 0 min  Stress: No Stress Concern Present  . Feeling of Stress : Not at all  Social Connections: Moderately Isolated  . Frequency of Communication with Friends and Family: More than three times a week  . Frequency of Social Gatherings with Friends and Family: Twice a week  . Attends Religious Services: Never  . Active Member of Clubs or Organizations: No  . Attends Archivist Meetings: Never  . Marital Status: Married  Human resources officer Violence: Not At Risk  . Fear of Current or Ex-Partner: No  . Emotionally Abused: No  . Physically Abused: No  . Sexually Abused: No  FAMILY HISTORY:  Family History  Problem Relation Age of Onset  . Colon cancer Neg Hx   . Gastric cancer Neg Hx   . Esophageal cancer Neg Hx     CURRENT MEDICATIONS:  Current Outpatient Medications  Medication Sig Dispense Refill  . feeding supplement, ENSURE ENLIVE, (ENSURE ENLIVE) LIQD Take 237 mLs by mouth 4 (four) times daily.     Marland Kitchen HYDROcodone-acetaminophen (NORCO/VICODIN) 5-325 MG tablet TAKE 1 TABLET EVERY 12 HOURS AS NEEDED FOR MODERATE PAIN 30 tablet 0  . levothyroxine (SYNTHROID) 88 MCG tablet Take 1 tablet (88 mcg total) by mouth daily before breakfast. 30 tablet 3  . Melatonin 10 MG TABS Take 1 tablet by mouth at bedtime.    . naproxen sodium (ALEVE) 220 MG tablet Take 220 mg by mouth daily as needed.     Marland Kitchen omeprazole (PRILOSEC) 20 MG capsule TAKE 1 CAPSULE BY MOUTH 30 MINUTES PRIOR TO BREAKFAST 90 capsule 3  . Pembrolizumab (KEYTRUDA IV) Inject into the vein every 21 ( twenty-one) days.     No current facility-administered medications for this visit.   Facility-Administered Medications Ordered  in Other Visits  Medication Dose Route Frequency Provider Last Rate Last Admin  . sodium chloride flush (NS) 0.9 % injection 10 mL  10 mL Intracatheter PRN Derek Jack, MD   10 mL at 06/24/19 0925    ALLERGIES:  No Known Allergies  PHYSICAL EXAM:  Performance status (ECOG): 1 - Symptomatic but completely ambulatory  Vitals:   02/12/20 1328  BP: (!) 102/55  Pulse: 68  Resp: 18  Temp: (!) 96.9 F (36.1 C)  SpO2: 100%   Wt Readings from Last 3 Encounters:  02/12/20 139 lb (63 kg)  01/22/20 138 lb 3.2 oz (62.7 kg)  01/01/20 136 lb 14.5 oz (62.1 kg)   Physical Exam Vitals reviewed.  Constitutional:      Appearance: Normal appearance.  Cardiovascular:     Rate and Rhythm: Normal rate and regular rhythm.     Pulses: Normal pulses.     Heart sounds: Normal heart sounds.  Pulmonary:     Effort: Pulmonary effort is normal.     Breath sounds: Normal breath sounds.  Chest:     Comments: Port-a-Cath in L chest Musculoskeletal:     Right lower leg: No edema.     Left lower leg: No edema.  Neurological:     General: No focal deficit present.     Mental Status: He is alert and oriented to person, place, and time.  Psychiatric:        Mood and Affect: Mood normal.        Behavior: Behavior normal.     LABORATORY DATA:  I have reviewed the labs as listed.  CBC Latest Ref Rng & Units 02/12/2020 01/22/2020 01/01/2020  WBC 4.0 - 10.5 K/uL 5.0 3.1(L) 3.8(L)  Hemoglobin 13.0 - 17.0 g/dL 13.0 13.0 13.3  Hematocrit 39 - 52 % 39.9 40.5 41.2  Platelets 150 - 400 K/uL 186 164 173   CMP Latest Ref Rng & Units 02/12/2020 01/22/2020 01/01/2020  Glucose 70 - 99 mg/dL 131(H) 176(H) 96  BUN 8 - 23 mg/dL 28(H) 31(H) 25(H)  Creatinine 0.61 - 1.24 mg/dL 1.78(H) 1.63(H) 1.97(H)  Sodium 135 - 145 mmol/L 139 137 139  Potassium 3.5 - 5.1 mmol/L 4.0 4.2 4.4  Chloride 98 - 111 mmol/L 105 103 102  CO2 22 - 32 mmol/L _0 Calcium 8.9 - 10.3 mg/dL 9.3 9.0 9.3  Total Protein 6.5 - 8.1  g/dL 7.0 6.9 7.1  Total Bilirubin 0.3 - 1.2 mg/dL 0.9 0.6 0.6  Alkaline Phos 38 - 126 U/L 48 46 55  AST 15 - 41 U/L _0 ALT 0 - 44 U/L _1 DIAGNOSTIC IMAGING:  I have independently reviewed the scans and discussed with the patient. No results found.   ASSESSMENT:  1. 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 -6 cycles of carboplatin, paclitaxel and pembrolizumab from 05/03/2018 through 08/21/2018. -Maintenance pembrolizumab started on 09/11/2018. -PET scan on 09/15/2019 showed interval decrease in hypermetabolic areas associated with tongue and floor of the mouth. Hypermetabolic metastatic lymphadenopathy in the chest is stable. No new sites seen.  2. 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   PLAN:  1. 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 -Reviewed his labs which showed normal CBC.  LFTs are grossly normal. -We will proceed with Keytruda today and in 3 weeks. -RTC 6 weeks with repeat PET/CT scan.  2. Hypothyroidism: -Continue same dose of Synthroid.  TSH today is 2.1.  3. 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  4. Bilateral knee pains: -Well controlled with hydrocodone as needed.  He has used more pills because of the cold weather causing more pains.  We will send a refill today.  5. Nutrition: -He will continue boost every day.  6.  CKD: -Creatinine today is 1.78 and stable.  Encouraged hydration.   Orders placed this encounter:  No orders of the defined types were placed in this encounter.    Derek Jack, MD [place] [phone]   I, Milinda Antis, am acting as a scribe for  Dr. Sanda Linger.  I, Derek Jack MD, have reviewed the above documentation for accuracy and completeness, and I agree with the above.

## 2020-02-12 NOTE — Progress Notes (Signed)
Pt here for D1C30 of keytruda.  Labs reviewed with Dr Raliegh Ip, BUN 28 and creatinine 1.78.  Okay for treatment today.   Tolerated treatment well today without incidence. Vital signs stable prior to discharge.  Discharged ambulatory in stable condition.

## 2020-02-12 NOTE — Patient Instructions (Addendum)
[place] at [facility] Discharge Instructions  You were seen today by Dr. Delton Coombes. He went over your recent results. You received your treatment today; continue getting your treatment every 3 weeks. You will be scheduled for a PET scan before your next treatment. Dr. Delton Coombes will see you back in 6 weeks for labs and follow up.   Thank you for choosing [place] [facility] to provide your oncology and hematology care.  To afford each patient quality time with our provider, please arrive at least 15 minutes before your scheduled appointment time.   If you have a lab appointment with the [facility] please come in thru the Main Entrance and check in at the main information desk  You need to re-schedule your appointment should you arrive 10 or more minutes late.  We strive to give you quality time with our providers, and arriving late affects you and other patients whose appointments are after yours.  Also, if you no show three or more times for appointments you may be dismissed from the clinic at the providers discretion.     Again, thank you for choosing [facility].  Our hope is that these requests will decrease the amount of time that you wait before being seen by our physicians.       _____________________________________________________________  Should you have questions after your visit to [facility], please contact our office at [phone] between the hours of 8:00 a.m. and 4:30 p.m.  Voicemails left after 4:00 p.m. will not be returned until the following business day.  For prescription refill requests, have your pharmacy contact our office and allow 72 hours.    Cancer Center Support Programs:   > Cancer Support Group  2nd Tuesday of the month 1pm-2pm, Journey Room

## 2020-02-12 NOTE — Patient Instructions (Signed)
[facility] Discharge Instructions for Patients Receiving Chemotherapy  Today you received the following chemotherapy agents   To help prevent nausea and vomiting after your treatment, we encourage you to take your nausea medication   If you develop nausea and vomiting that is not controlled by your nausea medication, call the clinic.   BELOW ARE SYMPTOMS THAT SHOULD BE REPORTED IMMEDIATELY:  *FEVER GREATER THAN 100.5 F  *CHILLS WITH OR WITHOUT FEVER  NAUSEA AND VOMITING THAT IS NOT CONTROLLED WITH YOUR NAUSEA MEDICATION  *UNUSUAL SHORTNESS OF BREATH  *UNUSUAL BRUISING OR BLEEDING  TENDERNESS IN MOUTH AND THROAT WITH OR WITHOUT PRESENCE OF ULCERS  *URINARY PROBLEMS  *BOWEL PROBLEMS  UNUSUAL RASH Items with * indicate a potential emergency and should be followed up as soon as possible.  Feel free to call the clinic should you have any questions or concerns. The clinic phone number is [phone].  Please show the CHEMO ALERT CARD at check-in to the Emergency Department and triage nurse.   

## 2020-03-05 ENCOUNTER — Inpatient Hospital Stay (HOSPITAL_COMMUNITY): Payer: Medicare HMO

## 2020-03-05 ENCOUNTER — Other Ambulatory Visit: Payer: Self-pay

## 2020-03-05 ENCOUNTER — Other Ambulatory Visit: Payer: Self-pay | Admitting: Hematology and Oncology

## 2020-03-05 ENCOUNTER — Other Ambulatory Visit (HOSPITAL_COMMUNITY): Payer: Self-pay

## 2020-03-05 ENCOUNTER — Encounter (HOSPITAL_COMMUNITY): Payer: Self-pay

## 2020-03-05 ENCOUNTER — Inpatient Hospital Stay (HOSPITAL_COMMUNITY): Payer: Medicare HMO | Attending: Hematology

## 2020-03-05 VITALS — BP 100/54 | HR 64 | Temp 97.0°F | Resp 18

## 2020-03-05 DIAGNOSIS — Z79899 Other long term (current) drug therapy: Secondary | ICD-10-CM | POA: Insufficient documentation

## 2020-03-05 DIAGNOSIS — Z85810 Personal history of malignant neoplasm of tongue: Secondary | ICD-10-CM | POA: Diagnosis not present

## 2020-03-05 DIAGNOSIS — M25561 Pain in right knee: Secondary | ICD-10-CM | POA: Insufficient documentation

## 2020-03-05 DIAGNOSIS — Z9221 Personal history of antineoplastic chemotherapy: Secondary | ICD-10-CM | POA: Diagnosis not present

## 2020-03-05 DIAGNOSIS — E039 Hypothyroidism, unspecified: Secondary | ICD-10-CM | POA: Insufficient documentation

## 2020-03-05 DIAGNOSIS — Z87891 Personal history of nicotine dependence: Secondary | ICD-10-CM | POA: Insufficient documentation

## 2020-03-05 DIAGNOSIS — Z5112 Encounter for antineoplastic immunotherapy: Secondary | ICD-10-CM | POA: Diagnosis not present

## 2020-03-05 DIAGNOSIS — C3492 Malignant neoplasm of unspecified part of left bronchus or lung: Secondary | ICD-10-CM | POA: Diagnosis not present

## 2020-03-05 DIAGNOSIS — Z923 Personal history of irradiation: Secondary | ICD-10-CM | POA: Insufficient documentation

## 2020-03-05 DIAGNOSIS — C109 Malignant neoplasm of oropharynx, unspecified: Secondary | ICD-10-CM

## 2020-03-05 DIAGNOSIS — M25562 Pain in left knee: Secondary | ICD-10-CM | POA: Insufficient documentation

## 2020-03-05 DIAGNOSIS — N189 Chronic kidney disease, unspecified: Secondary | ICD-10-CM | POA: Insufficient documentation

## 2020-03-05 LAB — COMPREHENSIVE METABOLIC PANEL
ALT: 17 U/L (ref 0–44)
AST: 22 U/L (ref 15–41)
Albumin: 3.6 g/dL (ref 3.5–5.0)
Alkaline Phosphatase: 46 U/L (ref 38–126)
Anion gap: 8 (ref 5–15)
BUN: 22 mg/dL (ref 8–23)
CO2: 26 mmol/L (ref 22–32)
Calcium: 8.6 mg/dL — ABNORMAL LOW (ref 8.9–10.3)
Chloride: 104 mmol/L (ref 98–111)
Creatinine, Ser: 1.94 mg/dL — ABNORMAL HIGH (ref 0.61–1.24)
GFR, Estimated: 37 mL/min — ABNORMAL LOW (ref >60)
Glucose, Bld: 87 mg/dL (ref 70–99)
Potassium: 3.9 mmol/L (ref 3.5–5.1)
Sodium: 138 mmol/L (ref 135–145)
Total Bilirubin: 0.5 mg/dL (ref 0.3–1.2)
Total Protein: 6.4 g/dL — ABNORMAL LOW (ref 6.5–8.1)

## 2020-03-05 LAB — CBC WITH DIFFERENTIAL/PLATELET
Abs Immature Granulocytes: 0.01 10*3/uL (ref 0.00–0.07)
Basophils Absolute: 0 10*3/uL (ref 0.0–0.1)
Basophils Relative: 0 %
Eosinophils Absolute: 0.1 10*3/uL (ref 0.0–0.5)
Eosinophils Relative: 2 %
HCT: 38.7 % — ABNORMAL LOW (ref 39.0–52.0)
Hemoglobin: 12.7 g/dL — ABNORMAL LOW (ref 13.0–17.0)
Immature Granulocytes: 0 %
Lymphocytes Relative: 19 %
Lymphs Abs: 1 10*3/uL (ref 0.7–4.0)
MCH: 30.8 pg (ref 26.0–34.0)
MCHC: 32.8 g/dL (ref 30.0–36.0)
MCV: 93.7 fL (ref 80.0–100.0)
Monocytes Absolute: 0.5 10*3/uL (ref 0.1–1.0)
Monocytes Relative: 10 %
Neutro Abs: 3.5 10*3/uL (ref 1.7–7.7)
Neutrophils Relative %: 69 %
Platelets: 186 10*3/uL (ref 150–400)
RBC: 4.13 MIL/uL — ABNORMAL LOW (ref 4.22–5.81)
RDW: 15 % (ref 11.5–15.5)
WBC: 5.2 10*3/uL (ref 4.0–10.5)
nRBC: 0 % (ref 0.0–0.2)

## 2020-03-05 LAB — TSH: TSH: 2.788 u[IU]/mL (ref 0.350–4.500)

## 2020-03-05 MED ORDER — SODIUM CHLORIDE 0.9 % IV SOLN: Freq: Once | INTRAVENOUS | Status: AC

## 2020-03-05 MED ORDER — HEPARIN SOD (PORK) LOCK FLUSH 100 UNIT/ML IV SOLN
500.0000 [IU] | Freq: Once | INTRAVENOUS | Status: AC | PRN
  Administered 2020-03-05: 500 [IU]

## 2020-03-05 MED ORDER — HYDROCODONE-ACETAMINOPHEN 5-325 MG PO TABS: ORAL_TABLET | ORAL | 0 refills | Status: DC

## 2020-03-05 MED ORDER — PEMBROLIZUMAB CHEMO INJECTION 100 MG/4ML
200.0000 mg | Freq: Once | INTRAVENOUS | Status: AC
  Administered 2020-03-05: 200 mg via INTRAVENOUS
  Filled 2020-03-05: qty 8

## 2020-03-05 NOTE — Progress Notes (Signed)
Tolerated infusion w/o adverse reaction.  Alert, in no distress.  VSS.  Discharged ambulatory in stable condition.  

## 2020-03-05 NOTE — Progress Notes (Signed)
Patients port flushed without difficulty.  Good blood return noted with no bruising or swelling noted at site.  Transparent dressing applied.  Patient left accessed for chemotherapy treatment. 

## 2020-03-15 ENCOUNTER — Other Ambulatory Visit: Payer: Self-pay

## 2020-03-15 ENCOUNTER — Encounter (HOSPITAL_COMMUNITY)
Admission: RE | Admit: 2020-03-15 | Discharge: 2020-03-15 | Disposition: A | Discharge status: Home / Self Care | Payer: Medicare HMO | Source: Ambulatory Visit | Attending: Hematology | Admitting: Hematology

## 2020-03-15 DIAGNOSIS — J432 Centrilobular emphysema: Secondary | ICD-10-CM | POA: Diagnosis not present

## 2020-03-15 DIAGNOSIS — I700 Atherosclerosis of aorta: Secondary | ICD-10-CM | POA: Diagnosis not present

## 2020-03-15 DIAGNOSIS — C3492 Malignant neoplasm of unspecified part of left bronchus or lung: Secondary | ICD-10-CM | POA: Diagnosis not present

## 2020-03-15 DIAGNOSIS — C3490 Malignant neoplasm of unspecified part of unspecified bronchus or lung: Secondary | ICD-10-CM | POA: Diagnosis not present

## 2020-03-15 DIAGNOSIS — I2510 Atherosclerotic heart disease of native coronary artery without angina pectoris: Secondary | ICD-10-CM | POA: Diagnosis not present

## 2020-03-15 MED ORDER — FLUDEOXYGLUCOSE F - 18 (FDG) INJECTION
8.0200 | Freq: Once | INTRAVENOUS | Status: AC | PRN
  Administered 2020-03-15: 16:00:00 8.02 via INTRAVENOUS

## 2020-03-25 ENCOUNTER — Inpatient Hospital Stay (HOSPITAL_COMMUNITY): Payer: Medicare HMO

## 2020-03-25 ENCOUNTER — Inpatient Hospital Stay (HOSPITAL_BASED_OUTPATIENT_CLINIC_OR_DEPARTMENT_OTHER): Payer: Medicare HMO | Admitting: Hematology

## 2020-03-25 ENCOUNTER — Other Ambulatory Visit: Payer: Self-pay

## 2020-03-25 ENCOUNTER — Other Ambulatory Visit (HOSPITAL_COMMUNITY): Payer: Self-pay

## 2020-03-25 VITALS — BP 103/66 | HR 75 | Temp 96.3°F | Resp 18

## 2020-03-25 VITALS — BP 99/57 | HR 66 | Temp 98.4°F | Resp 18

## 2020-03-25 DIAGNOSIS — C3492 Malignant neoplasm of unspecified part of left bronchus or lung: Secondary | ICD-10-CM

## 2020-03-25 DIAGNOSIS — E039 Hypothyroidism, unspecified: Secondary | ICD-10-CM | POA: Diagnosis not present

## 2020-03-25 DIAGNOSIS — Z9221 Personal history of antineoplastic chemotherapy: Secondary | ICD-10-CM | POA: Diagnosis not present

## 2020-03-25 DIAGNOSIS — Z85810 Personal history of malignant neoplasm of tongue: Secondary | ICD-10-CM | POA: Diagnosis not present

## 2020-03-25 DIAGNOSIS — C109 Malignant neoplasm of oropharynx, unspecified: Secondary | ICD-10-CM

## 2020-03-25 DIAGNOSIS — N189 Chronic kidney disease, unspecified: Secondary | ICD-10-CM | POA: Diagnosis not present

## 2020-03-25 DIAGNOSIS — M25562 Pain in left knee: Secondary | ICD-10-CM | POA: Diagnosis not present

## 2020-03-25 DIAGNOSIS — Z5112 Encounter for antineoplastic immunotherapy: Secondary | ICD-10-CM | POA: Diagnosis not present

## 2020-03-25 DIAGNOSIS — Z79899 Other long term (current) drug therapy: Secondary | ICD-10-CM | POA: Diagnosis not present

## 2020-03-25 DIAGNOSIS — Z87891 Personal history of nicotine dependence: Secondary | ICD-10-CM | POA: Diagnosis not present

## 2020-03-25 DIAGNOSIS — M25561 Pain in right knee: Secondary | ICD-10-CM | POA: Diagnosis not present

## 2020-03-25 LAB — TSH: TSH: 3.739 u[IU]/mL (ref 0.350–4.500)

## 2020-03-25 LAB — CBC WITH DIFFERENTIAL/PLATELET
Abs Immature Granulocytes: 0.01 10*3/uL (ref 0.00–0.07)
Basophils Absolute: 0 10*3/uL (ref 0.0–0.1)
Basophils Relative: 0 %
Eosinophils Absolute: 0.1 10*3/uL (ref 0.0–0.5)
Eosinophils Relative: 2 %
HCT: 41.9 % (ref 39.0–52.0)
Hemoglobin: 13.6 g/dL (ref 13.0–17.0)
Immature Granulocytes: 0 %
Lymphocytes Relative: 19 %
Lymphs Abs: 1.2 10*3/uL (ref 0.7–4.0)
MCH: 30.6 pg (ref 26.0–34.0)
MCHC: 32.5 g/dL (ref 30.0–36.0)
MCV: 94.2 fL (ref 80.0–100.0)
Monocytes Absolute: 0.6 10*3/uL (ref 0.1–1.0)
Monocytes Relative: 10 %
Neutro Abs: 4.3 10*3/uL (ref 1.7–7.7)
Neutrophils Relative %: 69 %
Platelets: 198 10*3/uL (ref 150–400)
RBC: 4.45 MIL/uL (ref 4.22–5.81)
RDW: 15.4 % (ref 11.5–15.5)
WBC: 6.3 10*3/uL (ref 4.0–10.5)
nRBC: 0 % (ref 0.0–0.2)

## 2020-03-25 LAB — COMPREHENSIVE METABOLIC PANEL
ALT: 23 U/L (ref 0–44)
AST: 27 U/L (ref 15–41)
Albumin: 3.3 g/dL — ABNORMAL LOW (ref 3.5–5.0)
Alkaline Phosphatase: 48 U/L (ref 38–126)
Anion gap: 10 (ref 5–15)
BUN: 28 mg/dL — ABNORMAL HIGH (ref 8–23)
CO2: 25 mmol/L (ref 22–32)
Calcium: 8.5 mg/dL — ABNORMAL LOW (ref 8.9–10.3)
Chloride: 104 mmol/L (ref 98–111)
Creatinine, Ser: 1.75 mg/dL — ABNORMAL HIGH (ref 0.61–1.24)
GFR, Estimated: 41 mL/min — ABNORMAL LOW (ref >60)
Glucose, Bld: 111 mg/dL — ABNORMAL HIGH (ref 70–99)
Potassium: 4.3 mmol/L (ref 3.5–5.1)
Sodium: 139 mmol/L (ref 135–145)
Total Bilirubin: 0.4 mg/dL (ref 0.3–1.2)
Total Protein: 6.1 g/dL — ABNORMAL LOW (ref 6.5–8.1)

## 2020-03-25 MED ORDER — SODIUM CHLORIDE 0.9 % IV SOLN
200.0000 mg | Freq: Once | INTRAVENOUS | Status: AC
  Administered 2020-03-25: 13:00:00 200 mg via INTRAVENOUS
  Filled 2020-03-25: qty 8

## 2020-03-25 MED ORDER — HYDROCODONE-ACETAMINOPHEN 5-325 MG PO TABS: ORAL_TABLET | ORAL | 0 refills | Status: DC

## 2020-03-25 MED ORDER — SODIUM CHLORIDE 0.9% FLUSH
10.0000 mL | Freq: Once | INTRAVENOUS | Status: AC
  Administered 2020-03-25: 11:00:00 10 mL via INTRAVENOUS

## 2020-03-25 MED ORDER — SODIUM CHLORIDE 0.9 % IV SOLN: Freq: Once | INTRAVENOUS | Status: AC

## 2020-03-25 MED ORDER — SODIUM CHLORIDE 0.9% FLUSH
10.0000 mL | INTRAVENOUS | Status: DC | PRN
  Administered 2020-03-25 (×2): 10 mL

## 2020-03-25 MED ORDER — HEPARIN SOD (PORK) LOCK FLUSH 100 UNIT/ML IV SOLN: 500.0000 [IU] | Freq: Once | INTRAVENOUS | Status: DC

## 2020-03-25 MED ORDER — HEPARIN SOD (PORK) LOCK FLUSH 100 UNIT/ML IV SOLN
500.0000 [IU] | Freq: Once | INTRAVENOUS | Status: AC | PRN
  Administered 2020-03-25: 14:00:00 500 [IU]

## 2020-03-25 NOTE — Progress Notes (Signed)
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   07/28/2014 Initial Diagnosis   Oropharyngeal cancer   08/03/2014 Imaging   CT CAP- L supraclavicular lymphadenopathy is not completely visualized. This is better seen on the previous neck CT from 07/27/2014. Otherwise, no evidence for metastatic disease in the chest, abdomen, or pelvis.   08/03/2014 Imaging   Bone scan- Uptake at adjacent anterior LEFT 6, 7, 8 ribs likely representing trauma/fractures. Questionable nonspecific increased tracer localization at the posterior RIGHT 8th and 9th ribs, the adjacent nature which raises a a question of trauma as well   08/06/2014 Pathology Results   Dr. Benjamine Mola- Oropharynx, biopsy, Left - INVASIVE SQUAMOUS CELL CARCINOMA.   08/12/2014 Procedure   Dr. Enrique Sack- 1. Multiple extraction of tooth numbers 6, 17, 22, 23, 24, 25, 26, and 27. 3 Quadrants of alveoloplasty   08/17/2014 Pathology Results   PORT and G-TUBE placed by Dr. Carlis Stable.   08/26/2014 PET scan   Large hypermetabolic mass in the left base of tongue. Activity extends across midline to the right base tongue. 2. Intensely hypermetabolic left cervical metastatic lymph nodes. Lymph nodes extend from the left level II position to the left supraclavi   09/01/2014 - 09/22/2014 Chemotherapy   Concurrent chemoradiation with Cisplatin 100 mg/m2 x 2 cycles with Neulasta  support. Held cycle #3 d/t renal toxicity.    09/03/2014 - 10/23/2014 Radiation Therapy   Treated in [place], IMRT Isidore Moos).  Base of tongue and bilat neck. Total dose: 70 Gy in 35 fractions. (of note, he did miss several treatments requiring BID dosing towards the end of treatment).    01/25/2015 PET scan   Near complete resolution of metabolic activity at the base of tongue. Minimal residual activity is likely post treatment effect. 2. Complete resolution of metabolic activity above LEFT cervical lymph nodes. No evidence of residual metabolically active    9/62/8366 Procedure   Port-a-cath removed [name])    05/03/2018 - 08/23/2018 Chemotherapy   The patient had dexamethasone (DECADRON) 4 MG tablet, 8 mg, Oral, Daily, 1 of 1 cycle, Start date: 05/01/2018, End date: 10/23/2018 palonosetron (ALOXI) injection 0.25 mg, 0.25 mg, Intravenous,  Once, 6 of 6 cycles Administration: 0.25 mg (05/03/2018), 0.25 mg (05/24/2018), 0.25 mg (06/14/2018), 0.25 mg (07/09/2018), 0.25 mg (07/30/2018), 0.25 mg (08/21/2018) pegfilgrastim-cbqv (UDENYCA) injection 6 mg, 6 mg, Subcutaneous, Once, 5 of 5 cycles Administration: 6 mg (05/27/2018), 6 mg (06/17/2018), 6 mg (07/11/2018), 6 mg (08/01/2018), 6 mg (08/23/2018) CARBOplatin (PARAPLATIN) 380 mg in sodium chloride 0.9 % 250 mL chemo infusion, 380 mg (100 % of original dose 381 mg), Intravenous,  Once, 6 of 6 cycles Dose modification:   (original dose 381 mg, Cycle 1),   (original dose 309.5 mg, Cycle 2), 307.5 mg (original dose 309.5 mg, Cycle 5) Administration: 380 mg (05/03/2018), 310 mg (05/24/2018), 310 mg (06/14/2018), 340 mg (07/09/2018), 310 mg (07/30/2018), 350 mg (08/21/2018) PACLitaxel (TAXOL) 330 mg in sodium chloride 0.9 %  500 mL chemo infusion (> $RemoveBef'80mg'GWLrDpOpus$ /m2), 175 mg/m2 = 330 mg (100 % of original dose 175 mg/m2), Intravenous,  Once, 6 of 6 cycles Dose modification: 175 mg/m2 (original dose 175 mg/m2, Cycle 1, Reason: Patient Age) Administration: 330 mg (05/03/2018), 330 mg (05/24/2018),  330 mg (06/14/2018), 330 mg (07/09/2018), 330 mg (07/30/2018), 330 mg (08/21/2018)  for chemotherapy treatment.    05/24/2018 -  Chemotherapy   The patient had pembrolizumab (KEYTRUDA) 200 mg in sodium chloride 0.9 % 50 mL chemo infusion, 200 mg, Intravenous, Once, 31 of 34 cycles Administration: 200 mg (05/24/2018), 200 mg (06/14/2018), 200 mg (07/09/2018), 200 mg (08/21/2018), 200 mg (09/11/2018), 200 mg (10/02/2018), 200 mg (10/23/2018), 200 mg (11/13/2018), 200 mg (12/04/2018), 200 mg (12/25/2018), 200 mg (01/22/2019), 200 mg (02/12/2019), 200 mg (03/05/2019), 200 mg (03/26/2019), 200 mg (04/16/2019), 200 mg (05/07/2019), 200 mg (06/03/2019), 200 mg (06/24/2019), 200 mg (07/17/2019), 200 mg (08/07/2019), 200 mg (08/28/2019), 200 mg (09/18/2019), 200 mg (10/09/2019), 200 mg (10/30/2019), 200 mg (11/20/2019), 200 mg (12/11/2019), 200 mg (01/01/2020), 200 mg (01/22/2020), 200 mg (02/12/2020), 200 mg (03/05/2020)  for chemotherapy treatment.    Squamous cell lung cancer, left ([facility])  06/14/2018 Initial Diagnosis   Squamous cell lung cancer, left ([facility])     CANCER STAGING: Cancer Staging Oropharyngeal carcinoma ([facility]) Staging form: Pharynx - Oropharynx, AJCC 7th Edition - Clinical: Stage IVA (T4a, N2b, M0) - Unsigned   INTERVAL HISTORY:  [name], a 69 y.o. male, returns for routine follow-up and consideration for next cycle of immunotherapy. Gjon was last seen on 02/12/2020.  Due for cycle #32 of Keytruda today.   Overall, he tells me he has been feeling pretty well. He is drinking 4 cans of Ensure High Protein, but is planning on cutting back since it is messing with his appetite and he is not eating as many regular meals. He is taking Norco for his knee pains PRN. He continues taking Synthroid 88 mcg daily. He denies having any diarrhea, itching or skin rashes.  He does not have any pets.  Overall, he feels ready for next cycle of immunotherapy today.    REVIEW OF SYSTEMS:  Review of Systems   Constitutional: Positive for appetite change (50%) and fatigue (75%).  Gastrointestinal: Negative for diarrhea.  Musculoskeletal: Positive for arthralgias (knee pains).  Skin: Negative for itching and rash.  All other systems reviewed and are negative.   PAST MEDICAL/SURGICAL HISTORY:  Past Medical History:  Diagnosis Date  . GERD (gastroesophageal reflux disease)   . 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 Oropharyngeal cancer ([facility]) 07/28/2014   dx. 3 weeks ago.- Dr. [facility] [place], [place].  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 BIOPSY  01/15/2018   Procedure: BIOPSY;  Surgeon: Danie Binder, MD;  [facility];  Service: Endoscopy;;  gastric  . COLONOSCOPY N/A 03/13/2016   Procedure: COLONOSCOPY;  Surgeon: Danie Binder, MD;  [facility];  Service: Endoscopy;  Laterality: N/A;  2:15 PM  . ESOPHAGOGASTRODUODENOSCOPY (EGD) WITH PROPOFOL N/A 08/17/2014   Procedure: ESOPHAGOGASTRODUODENOSCOPY (EGD) WITH PROPOFOL (procedure #1);  Surgeon: Aviva Signs Md, MD;  Location: [facility];  Service: General;  Laterality: N/A;  . ESOPHAGOGASTRODUODENOSCOPY (EGD) WITH PROPOFOL N/A 01/15/2018   Procedure: ESOPHAGOGASTRODUODENOSCOPY (EGD) WITH PROPOFOL;  Surgeon: Danie Binder, MD;  Location: [facility];  Service: Endoscopy;  Laterality: N/A;  9:30am  . MULTIPLE EXTRACTIONS WITH ALVEOLOPLASTY N/A 08/12/2014   Procedure: Extraction of tooth #'s 6,17,22,23,24,25,26,27 with alveoloplasty;  Surgeon: Charlynne Pander, DDS;  Location: [facility];  Service: Oral Surgery;  Laterality: N/A;  . PANENDOSCOPY N/A 08/06/2014   Procedure: PANENDOSCOPY WITH BIOPSY;  Surgeon: Newman Pies, MD;  Location: [facility];  Service: ENT;  Laterality: N/A;  . PEG PLACEMENT Left 08/17/14  . PEG PLACEMENT N/A 08/17/2014   Procedure:  PERCUTANEOUS ENDOSCOPIC GASTROSTOMY (PEG) PLACEMENT (procedure #1);  Surgeon: Franky Macho Md, MD;  Location: [facility];  Service: General;  Laterality: N/A;  . PORT-A-CATH REMOVAL Right 07/17/2016   Procedure: MINOR REMOVAL PORT-A-CATH;  Surgeon: Franky Macho, MD;  Location: [facility];  Service: General;  Laterality: Right;  . PORTACATH PLACEMENT Right 08/17/14  . PORTACATH PLACEMENT Right 08/17/2014   Procedure: INSERTION PORT-A-CATH (procedure #2);  Surgeon: Franky Macho Md, MD;  Location: [facility];  Service: General;  Laterality: Right;  . PORTACATH PLACEMENT Left 04/26/2018   Procedure: INSERTION PORT-A-CATH (attached catheter in left subclavian);  Surgeon: Franky Macho, MD;  Location: [facility];  Service: General;  Laterality: Left;  . SAVORY DILATION N/A 01/15/2018   Procedure: SAVORY DILATION;  Surgeon: West Bali, MD;  [facility];  Service: Endoscopy;  Laterality: N/A;  . VIDEO BRONCHOSCOPY WITH ENDOBRONCHIAL ULTRASOUND N/A 04/15/2018   Procedure: VIDEO BRONCHOSCOPY WITH ENDOBRONCHIAL ULTRASOUND;  Surgeon: Loreli Slot, MD;  Location: [facility];  Service: Thoracic;  Laterality: N/A;    SOCIAL HISTORY:  Social History   Socioeconomic History  . Marital status: Legally Separated    Spouse name: Not on file  . Number of children: 5  . Years of education: Not on file  . Highest education level: Not on file  Occupational History  . Not on file  Tobacco Use  . Smoking status: Former Smoker    Packs/day: 0.50    Years: 30.00    Pack years: 15.00    Quit date: 07/22/2014    Years since quitting: 5.6  . Smokeless tobacco: Never Used  Substance and Sexual Activity  . Alcohol use: Not Currently    Alcohol/week: 0.0 standard drinks    Comment: None currently (11/09/17); previously 1-2 beers on the weekend  . Drug use: No  . Sexual activity: Not on file  Other Topics Concern  . Not on file  Social History Narrative  . Not on file   Social Determinants of Health    Financial Resource Strain: Low Risk   . Difficulty of Paying Living Expenses: Not hard at all  Food Insecurity: No Food Insecurity  . Worried About Programme researcher, broadcasting/film/video in the Last Year: Never true  . Ran Out of Food in the Last Year: Never true  Transportation Needs: No Transportation Needs  . Lack of Transportation (Medical): No  . Lack of Transportation (Non-Medical): No  Physical Activity: Inactive  . Days of Exercise per Week: 0 days  . Minutes of Exercise per Session: 0 min  Stress: No Stress Concern Present  . Feeling of Stress : Not at all  Social Connections: Moderately Isolated  . Frequency of Communication with Friends and Family: More than three times a week  . Frequency of Social Gatherings with Friends and Family: Twice a week  . Attends Religious Services: Never  . Active Member of Clubs or Organizations: No  . Attends Banker Meetings: Never  .  Marital Status: Married  Human resources officer Violence: Not At Risk  . Fear of Current or Ex-Partner: No  . Emotionally Abused: No  . Physically Abused: No  . Sexually Abused: No    FAMILY HISTORY:  Family History  Problem Relation Age of Onset  . Colon cancer Neg Hx   . Gastric cancer Neg Hx   . Esophageal cancer Neg Hx     CURRENT MEDICATIONS:  Current Outpatient Medications  Medication Sig Dispense Refill  . feeding supplement, ENSURE ENLIVE, (ENSURE ENLIVE) LIQD Take 237 mLs by mouth 4 (four) times daily.     Marland Kitchen levothyroxine (SYNTHROID) 88 MCG tablet Take 1 tablet (88 mcg total) by mouth daily before breakfast. 30 tablet 3  . Melatonin 10 MG TABS Take 1 tablet by mouth at bedtime.    . naproxen sodium (ALEVE) 220 MG tablet Take 220 mg by mouth daily as needed.     Marland Kitchen omeprazole (PRILOSEC) 20 MG capsule TAKE 1 CAPSULE BY MOUTH 30 MINUTES PRIOR TO BREAKFAST 90 capsule 3  . Pembrolizumab (KEYTRUDA IV) Inject into the vein every 21 ( twenty-one) days.    Marland Kitchen HYDROcodone-acetaminophen (NORCO/VICODIN) 5-325 MG  tablet TAKE 1 TABLET EVERY 12 HOURS AS NEEDED FOR MODERATE PAIN (Patient not taking: Reported on 03/25/2020) 30 tablet 0   No current facility-administered medications for this visit.   Facility-Administered Medications Ordered in Other Visits  Medication Dose Route Frequency Provider Last Rate Last Admin  . sodium chloride flush (NS) 0.9 % injection 10 mL  10 mL Intracatheter PRN Derek Jack, MD   10 mL at 06/24/19 0925    ALLERGIES:  No Known Allergies  PHYSICAL EXAM:  Performance status (ECOG): 1 - Symptomatic but completely ambulatory  Vitals:   03/25/20 1203  BP: 103/66  Pulse: 75  Resp: 18  Temp: (!) 96.3 F (35.7 C)  SpO2: 100%   Wt Readings from Last 3 Encounters:  03/25/20 131 lb 12.8 oz (59.8 kg)  03/05/20 137 lb (62.1 kg)  02/12/20 139 lb (63 kg)   Physical Exam Vitals reviewed.  Constitutional:      Appearance: Normal appearance.  Cardiovascular:     Rate and Rhythm: Normal rate and regular rhythm.     Pulses: Normal pulses.     Heart sounds: Normal heart sounds.  Pulmonary:     Effort: Pulmonary effort is normal.     Breath sounds: Normal breath sounds.  Chest:  Breasts:     Right: No supraclavicular adenopathy.     Left: No supraclavicular adenopathy.    Abdominal:     Palpations: Abdomen is soft. There is no mass.     Tenderness: There is no abdominal tenderness.     Hernia: No hernia is present.  Musculoskeletal:     Right lower leg: No edema.     Left lower leg: No edema.  Lymphadenopathy:     Upper Body:     Right upper body: No supraclavicular adenopathy.     Left upper body: No supraclavicular adenopathy.     Lower Body: No right inguinal adenopathy. No left inguinal adenopathy.  Neurological:     General: No focal deficit present.     Mental Status: He is alert and oriented to person, place, and time.  Psychiatric:        Mood and Affect: Mood normal.        Behavior: Behavior normal.     LABORATORY DATA:  I have  reviewed the labs as listed.  CBC  Latest Ref Rng & Units 03/25/2020 03/05/2020 02/12/2020  WBC 4.0 - 10.5 K/uL 6.3 5.2 5.0  Hemoglobin 13.0 - 17.0 g/dL 13.6 12.7(L) 13.0  Hematocrit 39.0 - 52.0 % 41.9 38.7(L) 39.9  Platelets 150 - 400 K/uL 198 186 186   CMP Latest Ref Rng & Units 03/25/2020 03/05/2020 02/12/2020  Glucose 70 - 99 mg/dL 111(H) 87 131(H)  BUN 8 - 23 mg/dL 28(H) 22 28(H)  Creatinine 0.61 - 1.24 mg/dL 1.75(H) 1.94(H) 1.78(H)  Sodium 135 - 145 mmol/L 139 138 139  Potassium 3.5 - 5.1 mmol/L 4.3 3.9 4.0  Chloride 98 - 111 mmol/L 104 104 105  CO2 22 - 32 mmol/L $RemoveB'25 26 24  'ZksOhHIH$ Calcium 8.9 - 10.3 mg/dL 8.5(L) 8.6(L) 9.3  Total Protein 6.5 - 8.1 g/dL 6.1(L) 6.4(L) 7.0  Total Bilirubin 0.3 - 1.2 mg/dL 0.4 0.5 0.9  Alkaline Phos 38 - 126 U/L 48 46 48  AST 15 - 41 U/L $Remo'27 22 25  'qOeRt$ ALT 0 - 44 U/L $Remo'23 17 19    'sHQln$ DIAGNOSTIC IMAGING:  I have independently reviewed the scans and discussed with the patient. NM PET Image Restag (PS) Skull Base To Thigh  Result Date: 03/16/2020 CLINICAL DATA:  Subsequent treatment strategy for non-small-cell lung cancer. History of head neck cancer. EXAM: NUCLEAR MEDICINE PET SKULL BASE TO THIGH TECHNIQUE: 8.0 mCi F-18 FDG was injected intravenously. Full-ring PET imaging was performed from the skull base to thigh after the radiotracer. CT data was obtained and used for attenuation correction and anatomic localization. Fasting blood glucose: 103 mg/dl COMPARISON:  09/15/2019 FINDINGS: Mediastinal blood pool activity: SUV max 1.9 Liver activity: SUV max NA NECK: Persistent uptake again identified in the tongue/floor of mouth. SUV max = 11 today compared to 10.9 previously. A focus of uptake is identified in the right neck/supraclavicular region, similar to prior. No lymph node can be identified in this location but metastatic disease remains a concern. SUV max = 5.4 Incidental CT findings: none CHEST: Hypermetabolic mediastinal and bilateral hilar metastases again noted.  Index high right paratracheal node measured previously at 2.2 cm is now 1.9 cm (148/3). SUV max = 4.9 today compared to 8.3 previously. Index low right paratracheal/precarinal node is 1.7 cm short axis today (160/3) compared to 2.0 cm previously. SUV max = 5.0 today compared to 5.8 previously. Subcarinal index node documented previously shows SUV max = 6.3 today compared to 7.1 previously. No new hypermetabolic disease in the chest. Incidental CT findings: Left Port-A-Cath tip is positioned in the low SVC. Coronary artery calcification is evident. Atherosclerotic calcification is noted in the wall of the thoracic aorta. Centrilobular and paraseptal emphysema evident. ABDOMEN/PELVIS: No abnormal hypermetabolic activity within the liver, pancreas, adrenal glands, or spleen. No hypermetabolic lymph nodes in the abdomen. A focal area of hypermetabolism is identified along the right external iliac chain with SUV max = 9.6. This is new in the interval. No underlying lymphadenopathy can not be discerned on CT imaging performed today for attenuation correction without intravenous contrast. Diffuse fairly marked uptake in the wall the stomach is presumably physiologic. Incidental CT findings: Multiple low-density liver lesions again identified, compatible with cysts and unchanged in the interval. There is abdominal aortic atherosclerosis without aneurysm. SKELETON: No focal hypermetabolic activity to suggest skeletal metastasis. Incidental CT findings: none IMPRESSION: 1. Persistent, stable hypermetabolism identified with the tongue/floor of mouth. 2. Slight interval decrease in hypermetabolism associated with mediastinal and hilar metastatic lymphadenopathy. Persistent hypermetabolic focus in the right supraclavicular region compatible with metastatic  involvement. 3. New focus of hypermetabolism identified along the right external iliac chain of the pelvis. No discernible lymphadenopathy in this region due to lack of  intravenous contrast on CT imaging today. New metastatic involvement not excluded. Close attention on follow-up recommended. CT pelvis with intravenous contrast may prove helpful to further evaluate as clinically warranted. 4.  Aortic Atherosclerois (ICD10-170.0) 5.  Emphysema. ([id number]-R67.9) Electronically Signed   By: Misty Stanley M.D.   On: 03/16/2020 09:06     ASSESSMENT:  1. 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 -6 cycles of carboplatin, paclitaxel and pembrolizumab from 05/03/2018 through 08/21/2018. -Maintenance pembrolizumab started on 09/11/2018. -PET scan on 09/15/2019 showed interval decrease in hypermetabolic areas associated with tongue and floor of the mouth. Hypermetabolic metastatic lymphadenopathy in the chest is stable. No new sites seen. -PET scan on 03/15/2020 shows persistent, stable hypermetabolism in the tongue/floor of mouth.  Slight interval decrease in hypermetabolism with mediastinal/hilar adenopathy.  Persistent hypermetabolic focus in the right supraclavicular region.  New focus of hypermetabolic them identified in the right external iliac chain of pelvis with no discernible adenopathy on the CT.  2. 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   PLAN:  1. 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 -Reviewed the PET scan images with the patient.  Overall it showed stable to slight interval improvement.  However there is a new focus of hypermetabolism in the right external iliac region with no lymphadenopathy on the CT port.  We will closely monitor it on the next scan. -Reviewed his labs today which showed normal LFTs and CBC.  Proceed with next cycle of Keytruda.  RTC 9 weeks for follow-up.  2.  Hypothyroidism: -Continue Synthroid.  TSH 3.7.  3. 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  4. Bilateral knee pains: -Continue hydrocodone as needed.  5. Nutrition: -Continue boost every day.  6. CKD: -Creatinine is 1.75 and stable.   Orders placed this encounter:  No orders of the defined types were placed in this encounter.    Derek Jack, MD [facility] [phone]   I, Milinda Antis, am acting as a scribe for Dr. Sanda Linger.  I, Derek Jack MD, have reviewed the above documentation for accuracy and completeness, and I agree with the above.

## 2020-03-25 NOTE — Patient Instructions (Addendum)
[place] at [facility] Discharge Instructions  You were seen today by Dr. Delton Coombes. He went over your recent results and scans. You received your treatment today; continue getting your treatment every 3 weeks. Dr. Delton Coombes will see you back in 9 weeks for labs and follow up.   Thank you for choosing [place] at [facility] to provide your oncology and hematology care.  To afford each patient quality time with our provider, please arrive at least 15 minutes before your scheduled appointment time.   If you have a lab appointment with the [facility] please come in thru the Main Entrance and check in at the main information desk  You need to re-schedule your appointment should you arrive 10 or more minutes late.  We strive to give you quality time with our providers, and arriving late affects you and other patients whose appointments are after yours.  Also, if you no show three or more times for appointments you may be dismissed from the clinic at the providers discretion.     Again, thank you for choosing [facility].  Our hope is that these requests will decrease the amount of time that you wait before being seen by our physicians.       _____________________________________________________________  Should you have questions after your visit to [facility], please contact our office at [phone] between the hours of 8:00 a.m. and 4:30 p.m.  Voicemails left after 4:00 p.m. will not be returned until the following business day.  For prescription refill requests, have your pharmacy contact our office and allow 72 hours.    Cancer Center Support Programs:   > Cancer Support Group  2nd Tuesday of the month 1pm-2pm, Journey Room

## 2020-03-25 NOTE — Progress Notes (Signed)
Patients port flushed without difficulty.  Good blood return noted with no bruising or swelling noted at site.  Transparent dressing applied.  Patient left accessed for chemotherapy treatment. 

## 2020-03-25 NOTE — Patient Instructions (Signed)
[facility] Discharge Instructions for Patients Receiving Chemotherapy   Beginning January 23rd 2017 lab work for the [facility] will be done in the  Main lab at [facility] on 1st floor. If you have a lab appointment with the [facility] please come in thru the  Main Entrance and check in at the main information desk   Today you received the following chemotherapy agents Keytruda. Follow-up as scheduled  To help prevent nausea and vomiting after your treatment, we encourage you to take your nausea medication   If you develop nausea and vomiting, or diarrhea that is not controlled by your medication, call the clinic.  The clinic phone number is [phone]. Office hours are Monday-Friday 8:30am-5:00pm.  BELOW ARE SYMPTOMS THAT SHOULD BE REPORTED IMMEDIATELY:  *FEVER GREATER THAN 101.0 F  *CHILLS WITH OR WITHOUT FEVER  NAUSEA AND VOMITING THAT IS NOT CONTROLLED WITH YOUR NAUSEA MEDICATION  *UNUSUAL SHORTNESS OF BREATH  *UNUSUAL BRUISING OR BLEEDING  TENDERNESS IN MOUTH AND THROAT WITH OR WITHOUT PRESENCE OF ULCERS  *URINARY PROBLEMS  *BOWEL PROBLEMS  UNUSUAL RASH Items with * indicate a potential emergency and should be followed up as soon as possible. If you have an emergency after office hours please contact your primary care physician or go to the nearest emergency department.  Please call the clinic during office hours if you have any questions or concerns.   You may also contact the Patient Navigator at [phone] should you have any questions or need assistance in obtaining follow up care.      Resources For Cancer Patients and their Caregivers ? American Cancer Society: Can assist with transportation, wigs, general needs, runs Look Good Feel Better.        440 052 1254 ? Cancer Care: Provides financial assistance, online support groups, medication/co-pay assistance.  1-800-813-HOPE (478)674-7986) ? [place] Assists [place] cancer patients and their families through emotional , educational and financial support.  559-882-2783 ? [place] DSS Where to apply for food stamps, Medicaid and utility assistance. 513-112-3882 ? [employer]: Transportation to medical appointments. 714-426-1894 ? Social Security Administration: May apply for disability if have a Stage IV cancer. (660)474-4192 781-704-5840 ? [employer], Disability and Transit Services: Assists with nutrition, care and transit needs. 516-123-3958

## 2020-03-25 NOTE — Progress Notes (Signed)
1230 Labs reviewed with and pt seen by Dr. Delton Coombes and pt approved for Keytruda infusion today per MD                                 Alexander [name] tolerated Keytruda infusion well without complaints or incident. VSS upon discharge. Pt discharged self ambulatory in satisfactory condition

## 2020-04-15 ENCOUNTER — Inpatient Hospital Stay (HOSPITAL_COMMUNITY): Payer: Medicare HMO

## 2020-04-16 ENCOUNTER — Other Ambulatory Visit: Payer: Self-pay

## 2020-04-16 ENCOUNTER — Encounter (HOSPITAL_COMMUNITY): Payer: Self-pay

## 2020-04-16 ENCOUNTER — Inpatient Hospital Stay (HOSPITAL_COMMUNITY): Payer: Medicare HMO

## 2020-04-16 ENCOUNTER — Inpatient Hospital Stay (HOSPITAL_COMMUNITY): Payer: Medicare HMO | Attending: Hematology

## 2020-04-16 VITALS — BP 90/55 | HR 70 | Temp 97.1°F | Resp 18 | Wt 131.8 lb

## 2020-04-16 DIAGNOSIS — C3492 Malignant neoplasm of unspecified part of left bronchus or lung: Secondary | ICD-10-CM | POA: Diagnosis not present

## 2020-04-16 DIAGNOSIS — N189 Chronic kidney disease, unspecified: Secondary | ICD-10-CM | POA: Diagnosis not present

## 2020-04-16 DIAGNOSIS — E039 Hypothyroidism, unspecified: Secondary | ICD-10-CM | POA: Insufficient documentation

## 2020-04-16 DIAGNOSIS — Z5112 Encounter for antineoplastic immunotherapy: Secondary | ICD-10-CM | POA: Diagnosis not present

## 2020-04-16 DIAGNOSIS — Z923 Personal history of irradiation: Secondary | ICD-10-CM | POA: Diagnosis not present

## 2020-04-16 DIAGNOSIS — Z79899 Other long term (current) drug therapy: Secondary | ICD-10-CM | POA: Insufficient documentation

## 2020-04-16 DIAGNOSIS — Z9221 Personal history of antineoplastic chemotherapy: Secondary | ICD-10-CM | POA: Insufficient documentation

## 2020-04-16 DIAGNOSIS — Z85810 Personal history of malignant neoplasm of tongue: Secondary | ICD-10-CM | POA: Diagnosis not present

## 2020-04-16 DIAGNOSIS — Z87891 Personal history of nicotine dependence: Secondary | ICD-10-CM | POA: Insufficient documentation

## 2020-04-16 DIAGNOSIS — M25562 Pain in left knee: Secondary | ICD-10-CM | POA: Insufficient documentation

## 2020-04-16 DIAGNOSIS — M25561 Pain in right knee: Secondary | ICD-10-CM | POA: Insufficient documentation

## 2020-04-16 DIAGNOSIS — C109 Malignant neoplasm of oropharynx, unspecified: Secondary | ICD-10-CM

## 2020-04-16 LAB — CBC WITH DIFFERENTIAL/PLATELET
Abs Immature Granulocytes: 0.01 10*3/uL (ref 0.00–0.07)
Basophils Absolute: 0 10*3/uL (ref 0.0–0.1)
Basophils Relative: 0 %
Eosinophils Absolute: 0 10*3/uL (ref 0.0–0.5)
Eosinophils Relative: 0 %
HCT: 40.4 % (ref 39.0–52.0)
Hemoglobin: 13.5 g/dL (ref 13.0–17.0)
Immature Granulocytes: 0 %
Lymphocytes Relative: 20 %
Lymphs Abs: 1.3 10*3/uL (ref 0.7–4.0)
MCH: 31.4 pg (ref 26.0–34.0)
MCHC: 33.4 g/dL (ref 30.0–36.0)
MCV: 94 fL (ref 80.0–100.0)
Monocytes Absolute: 0.6 10*3/uL (ref 0.1–1.0)
Monocytes Relative: 9 %
Neutro Abs: 4.6 10*3/uL (ref 1.7–7.7)
Neutrophils Relative %: 71 %
Platelets: 192 10*3/uL (ref 150–400)
RBC: 4.3 MIL/uL (ref 4.22–5.81)
RDW: 15.3 % (ref 11.5–15.5)
WBC: 6.6 10*3/uL (ref 4.0–10.5)
nRBC: 0 % (ref 0.0–0.2)

## 2020-04-16 LAB — COMPREHENSIVE METABOLIC PANEL
ALT: 31 U/L (ref 0–44)
AST: 25 U/L (ref 15–41)
Albumin: 3.4 g/dL — ABNORMAL LOW (ref 3.5–5.0)
Alkaline Phosphatase: 45 U/L (ref 38–126)
Anion gap: 10 (ref 5–15)
BUN: 31 mg/dL — ABNORMAL HIGH (ref 8–23)
CO2: 24 mmol/L (ref 22–32)
Calcium: 9 mg/dL (ref 8.9–10.3)
Chloride: 103 mmol/L (ref 98–111)
Creatinine, Ser: 1.81 mg/dL — ABNORMAL HIGH (ref 0.61–1.24)
GFR, Estimated: 40 mL/min — ABNORMAL LOW (ref >60)
Glucose, Bld: 73 mg/dL (ref 70–99)
Potassium: 3.9 mmol/L (ref 3.5–5.1)
Sodium: 137 mmol/L (ref 135–145)
Total Bilirubin: <0.1 mg/dL — ABNORMAL LOW (ref 0.3–1.2)
Total Protein: 6.2 g/dL — ABNORMAL LOW (ref 6.5–8.1)

## 2020-04-16 LAB — TSH: TSH: 11.403 u[IU]/mL — ABNORMAL HIGH (ref 0.350–4.500)

## 2020-04-16 MED ORDER — SODIUM CHLORIDE 0.9% FLUSH
10.0000 mL | INTRAVENOUS | Status: DC | PRN
  Administered 2020-04-16: 10 mL

## 2020-04-16 MED ORDER — SODIUM CHLORIDE 0.9 % IV SOLN
200.0000 mg | Freq: Once | INTRAVENOUS | Status: AC
  Administered 2020-04-16: 200 mg via INTRAVENOUS
  Filled 2020-04-16: qty 8

## 2020-04-16 MED ORDER — HEPARIN SOD (PORK) LOCK FLUSH 100 UNIT/ML IV SOLN
500.0000 [IU] | Freq: Once | INTRAVENOUS | Status: AC | PRN
  Administered 2020-04-16: 500 [IU]

## 2020-04-16 MED ORDER — SODIUM CHLORIDE 0.9 % IV SOLN: Freq: Once | INTRAVENOUS | Status: AC

## 2020-04-16 NOTE — Progress Notes (Signed)
Serum creat 1.8 today for treatment.  Oncologist notified.  Ok to treat today verbal order Dr. Chryl Heck.   Patient tolerated therapy with no complaints voiced.  Side effects with management reviewed with understanding verbalized.  Port site clean and dry with no bruising or swelling noted at site.  Good blood return noted before and after administration of therapy.  Band aid applied.  Patient left in satisfactory condition with VSS and no s/s of distress noted.

## 2020-05-06 ENCOUNTER — Encounter (HOSPITAL_COMMUNITY): Payer: Self-pay

## 2020-05-06 ENCOUNTER — Inpatient Hospital Stay (HOSPITAL_COMMUNITY): Payer: Medicare HMO

## 2020-05-06 ENCOUNTER — Inpatient Hospital Stay (HOSPITAL_COMMUNITY): Payer: Medicare HMO | Attending: Hematology

## 2020-05-06 ENCOUNTER — Other Ambulatory Visit: Payer: Self-pay

## 2020-05-06 ENCOUNTER — Other Ambulatory Visit (HOSPITAL_COMMUNITY): Payer: Self-pay | Admitting: *Deleted

## 2020-05-06 VITALS — BP 113/63 | HR 68 | Temp 97.4°F | Resp 18

## 2020-05-06 DIAGNOSIS — Z87891 Personal history of nicotine dependence: Secondary | ICD-10-CM | POA: Insufficient documentation

## 2020-05-06 DIAGNOSIS — Z9221 Personal history of antineoplastic chemotherapy: Secondary | ICD-10-CM | POA: Diagnosis not present

## 2020-05-06 DIAGNOSIS — C109 Malignant neoplasm of oropharynx, unspecified: Secondary | ICD-10-CM

## 2020-05-06 DIAGNOSIS — E039 Hypothyroidism, unspecified: Secondary | ICD-10-CM | POA: Insufficient documentation

## 2020-05-06 DIAGNOSIS — N189 Chronic kidney disease, unspecified: Secondary | ICD-10-CM | POA: Insufficient documentation

## 2020-05-06 DIAGNOSIS — Z79899 Other long term (current) drug therapy: Secondary | ICD-10-CM | POA: Diagnosis not present

## 2020-05-06 DIAGNOSIS — Z923 Personal history of irradiation: Secondary | ICD-10-CM | POA: Diagnosis not present

## 2020-05-06 DIAGNOSIS — Z5112 Encounter for antineoplastic immunotherapy: Secondary | ICD-10-CM | POA: Diagnosis not present

## 2020-05-06 DIAGNOSIS — M25561 Pain in right knee: Secondary | ICD-10-CM | POA: Insufficient documentation

## 2020-05-06 DIAGNOSIS — C3492 Malignant neoplasm of unspecified part of left bronchus or lung: Secondary | ICD-10-CM

## 2020-05-06 DIAGNOSIS — M25562 Pain in left knee: Secondary | ICD-10-CM | POA: Diagnosis not present

## 2020-05-06 DIAGNOSIS — Z85810 Personal history of malignant neoplasm of tongue: Secondary | ICD-10-CM | POA: Insufficient documentation

## 2020-05-06 LAB — CBC WITH DIFFERENTIAL/PLATELET
Abs Immature Granulocytes: 0.02 10*3/uL (ref 0.00–0.07)
Basophils Absolute: 0 10*3/uL (ref 0.0–0.1)
Basophils Relative: 0 %
Eosinophils Absolute: 0 10*3/uL (ref 0.0–0.5)
Eosinophils Relative: 0 %
HCT: 37.1 % — ABNORMAL LOW (ref 39.0–52.0)
Hemoglobin: 12.2 g/dL — ABNORMAL LOW (ref 13.0–17.0)
Immature Granulocytes: 0 %
Lymphocytes Relative: 16 %
Lymphs Abs: 0.9 10*3/uL (ref 0.7–4.0)
MCH: 30.7 pg (ref 26.0–34.0)
MCHC: 32.9 g/dL (ref 30.0–36.0)
MCV: 93.2 fL (ref 80.0–100.0)
Monocytes Absolute: 0.5 10*3/uL (ref 0.1–1.0)
Monocytes Relative: 9 %
Neutro Abs: 4 10*3/uL (ref 1.7–7.7)
Neutrophils Relative %: 75 %
Platelets: 164 10*3/uL (ref 150–400)
RBC: 3.98 MIL/uL — ABNORMAL LOW (ref 4.22–5.81)
RDW: 15 % (ref 11.5–15.5)
WBC: 5.4 10*3/uL (ref 4.0–10.5)
nRBC: 0 % (ref 0.0–0.2)

## 2020-05-06 LAB — COMPREHENSIVE METABOLIC PANEL
ALT: 24 U/L (ref 0–44)
AST: 23 U/L (ref 15–41)
Albumin: 3.1 g/dL — ABNORMAL LOW (ref 3.5–5.0)
Alkaline Phosphatase: 45 U/L (ref 38–126)
Anion gap: 7 (ref 5–15)
BUN: 27 mg/dL — ABNORMAL HIGH (ref 8–23)
CO2: 25 mmol/L (ref 22–32)
Calcium: 8.8 mg/dL — ABNORMAL LOW (ref 8.9–10.3)
Chloride: 104 mmol/L (ref 98–111)
Creatinine, Ser: 1.81 mg/dL — ABNORMAL HIGH (ref 0.61–1.24)
GFR, Estimated: 40 mL/min — ABNORMAL LOW (ref >60)
Glucose, Bld: 94 mg/dL (ref 70–99)
Potassium: 4.2 mmol/L (ref 3.5–5.1)
Sodium: 136 mmol/L (ref 135–145)
Total Bilirubin: 0.7 mg/dL (ref 0.3–1.2)
Total Protein: 6 g/dL — ABNORMAL LOW (ref 6.5–8.1)

## 2020-05-06 LAB — TSH: TSH: 3.015 u[IU]/mL (ref 0.350–4.500)

## 2020-05-06 MED ORDER — HYDROCODONE-ACETAMINOPHEN 5-325 MG PO TABS: ORAL_TABLET | ORAL | 0 refills | Status: DC

## 2020-05-06 MED ORDER — HEPARIN SOD (PORK) LOCK FLUSH 100 UNIT/ML IV SOLN
500.0000 [IU] | Freq: Once | INTRAVENOUS | Status: AC | PRN
  Administered 2020-05-06: 500 [IU]

## 2020-05-06 MED ORDER — SODIUM CHLORIDE 0.9 % IV SOLN
200.0000 mg | Freq: Once | INTRAVENOUS | Status: AC
  Administered 2020-05-06: 200 mg via INTRAVENOUS
  Filled 2020-05-06: qty 8

## 2020-05-06 MED ORDER — SODIUM CHLORIDE 0.9 % IV SOLN: Freq: Once | INTRAVENOUS | Status: AC

## 2020-05-06 NOTE — Progress Notes (Signed)
Lab results reviewed with Dr. Delton Coombes.  Okay to proceed with tx today per M.D.   Infusion tolerated without incident or complaint. VSS upon completion of treatment. Port flushed and deaccessed per protocol, see MAR and IV flowsheet for details. Discharged ambulatory in satisfactory condition with follow up instructions.

## 2020-05-06 NOTE — Patient Instructions (Signed)
[facility] at [facility] Discharge Instructions  Labs drawn from portacath today   Thank you for choosing [facility] at [facility] to provide your oncology and hematology care.  To afford each patient quality time with our provider, please arrive at least 15 minutes before your scheduled appointment time.   If you have a lab appointment with the Cancer Center please come in thru the Main Entrance and check in at the main information desk.  You need to re-schedule your appointment should you arrive 10 or more minutes late.  We strive to give you quality time with our providers, and arriving late affects you and other patients whose appointments are after yours.  Also, if you no show three or more times for appointments you may be dismissed from the clinic at the providers discretion.     Again, thank you for choosing  Cancer Center.  Our hope is that these requests will decrease the amount of time that you wait before being seen by our physicians.       _____________________________________________________________  Should you have questions after your visit to  Cancer Center, please contact our office at [phone] and follow the prompts.  Our office hours are 8:00 a.m. and 4:30 p.m. Monday - Friday.  Please note that voicemails left after 4:00 p.m. may not be returned until the following business day.  We are closed weekends and major holidays.  You do have access to a nurse 24-7, just call the main number to the clinic [phone] and do not press any options, hold on the line and a nurse will answer the phone.    For prescription refill requests, have your pharmacy contact our office and allow 72 hours.    Due to Covid, you will need to wear a mask upon entering the hospital. If you do not have a mask, a mask will be given to you at the Main Entrance upon arrival. For doctor visits, patients may have 1 support person age 18  or older with them. For treatment visits, patients can not have anyone with them due to social distancing guidelines and our immunocompromised population.     

## 2020-05-17 ENCOUNTER — Encounter: Payer: Self-pay | Admitting: Internal Medicine

## 2020-05-17 DIAGNOSIS — R972 Elevated prostate specific antigen [PSA]: Secondary | ICD-10-CM | POA: Diagnosis not present

## 2020-05-25 DIAGNOSIS — R599 Enlarged lymph nodes, unspecified: Secondary | ICD-10-CM | POA: Diagnosis not present

## 2020-05-25 DIAGNOSIS — Z79899 Other long term (current) drug therapy: Secondary | ICD-10-CM | POA: Diagnosis not present

## 2020-05-25 DIAGNOSIS — C7800 Secondary malignant neoplasm of unspecified lung: Secondary | ICD-10-CM | POA: Diagnosis not present

## 2020-05-25 DIAGNOSIS — C109 Malignant neoplasm of oropharynx, unspecified: Secondary | ICD-10-CM | POA: Diagnosis not present

## 2020-05-25 DIAGNOSIS — C01 Malignant neoplasm of base of tongue: Secondary | ICD-10-CM | POA: Diagnosis not present

## 2020-05-27 ENCOUNTER — Inpatient Hospital Stay (HOSPITAL_COMMUNITY): Payer: Medicare HMO | Attending: Hematology | Admitting: Hematology

## 2020-05-27 ENCOUNTER — Other Ambulatory Visit (HOSPITAL_COMMUNITY): Payer: Self-pay

## 2020-05-27 ENCOUNTER — Inpatient Hospital Stay (HOSPITAL_COMMUNITY): Payer: Medicare HMO

## 2020-05-27 ENCOUNTER — Other Ambulatory Visit (HOSPITAL_COMMUNITY): Payer: Medicare HMO

## 2020-05-27 ENCOUNTER — Other Ambulatory Visit: Payer: Self-pay

## 2020-05-27 VITALS — BP 123/58 | HR 72 | Temp 97.0°F | Resp 16 | Wt 136.0 lb

## 2020-05-27 VITALS — BP 141/78 | HR 73 | Temp 97.9°F | Resp 18

## 2020-05-27 DIAGNOSIS — Z5112 Encounter for antineoplastic immunotherapy: Secondary | ICD-10-CM | POA: Insufficient documentation

## 2020-05-27 DIAGNOSIS — R972 Elevated prostate specific antigen [PSA]: Secondary | ICD-10-CM | POA: Diagnosis not present

## 2020-05-27 DIAGNOSIS — Z923 Personal history of irradiation: Secondary | ICD-10-CM | POA: Insufficient documentation

## 2020-05-27 DIAGNOSIS — M25562 Pain in left knee: Secondary | ICD-10-CM | POA: Insufficient documentation

## 2020-05-27 DIAGNOSIS — E039 Hypothyroidism, unspecified: Secondary | ICD-10-CM | POA: Diagnosis not present

## 2020-05-27 DIAGNOSIS — Z9221 Personal history of antineoplastic chemotherapy: Secondary | ICD-10-CM | POA: Insufficient documentation

## 2020-05-27 DIAGNOSIS — Z79899 Other long term (current) drug therapy: Secondary | ICD-10-CM | POA: Insufficient documentation

## 2020-05-27 DIAGNOSIS — Z87891 Personal history of nicotine dependence: Secondary | ICD-10-CM | POA: Insufficient documentation

## 2020-05-27 DIAGNOSIS — C3492 Malignant neoplasm of unspecified part of left bronchus or lung: Secondary | ICD-10-CM

## 2020-05-27 DIAGNOSIS — N189 Chronic kidney disease, unspecified: Secondary | ICD-10-CM | POA: Insufficient documentation

## 2020-05-27 DIAGNOSIS — M25561 Pain in right knee: Secondary | ICD-10-CM | POA: Diagnosis not present

## 2020-05-27 DIAGNOSIS — Z85810 Personal history of malignant neoplasm of tongue: Secondary | ICD-10-CM | POA: Insufficient documentation

## 2020-05-27 DIAGNOSIS — C109 Malignant neoplasm of oropharynx, unspecified: Secondary | ICD-10-CM

## 2020-05-27 LAB — CBC WITH DIFFERENTIAL/PLATELET
Abs Immature Granulocytes: 0.03 10*3/uL (ref 0.00–0.07)
Basophils Absolute: 0 10*3/uL (ref 0.0–0.1)
Basophils Relative: 0 %
Eosinophils Absolute: 0 10*3/uL (ref 0.0–0.5)
Eosinophils Relative: 0 %
HCT: 39.1 % (ref 39.0–52.0)
Hemoglobin: 12.6 g/dL — ABNORMAL LOW (ref 13.0–17.0)
Immature Granulocytes: 0 %
Lymphocytes Relative: 7 %
Lymphs Abs: 0.6 10*3/uL — ABNORMAL LOW (ref 0.7–4.0)
MCH: 30.6 pg (ref 26.0–34.0)
MCHC: 32.2 g/dL (ref 30.0–36.0)
MCV: 94.9 fL (ref 80.0–100.0)
Monocytes Absolute: 0.3 10*3/uL (ref 0.1–1.0)
Monocytes Relative: 4 %
Neutro Abs: 7.4 10*3/uL (ref 1.7–7.7)
Neutrophils Relative %: 89 %
Platelets: 192 10*3/uL (ref 150–400)
RBC: 4.12 MIL/uL — ABNORMAL LOW (ref 4.22–5.81)
RDW: 15.4 % (ref 11.5–15.5)
WBC: 8.3 10*3/uL (ref 4.0–10.5)
nRBC: 0 % (ref 0.0–0.2)

## 2020-05-27 LAB — COMPREHENSIVE METABOLIC PANEL
ALT: 33 U/L (ref 0–44)
AST: 26 U/L (ref 15–41)
Albumin: 3.4 g/dL — ABNORMAL LOW (ref 3.5–5.0)
Alkaline Phosphatase: 56 U/L (ref 38–126)
Anion gap: 10 (ref 5–15)
BUN: 39 mg/dL — ABNORMAL HIGH (ref 8–23)
CO2: 24 mmol/L (ref 22–32)
Calcium: 8.5 mg/dL — ABNORMAL LOW (ref 8.9–10.3)
Chloride: 103 mmol/L (ref 98–111)
Creatinine, Ser: 1.68 mg/dL — ABNORMAL HIGH (ref 0.61–1.24)
GFR, Estimated: 43 mL/min — ABNORMAL LOW (ref >60)
Glucose, Bld: 159 mg/dL — ABNORMAL HIGH (ref 70–99)
Potassium: 4.7 mmol/L (ref 3.5–5.1)
Sodium: 137 mmol/L (ref 135–145)
Total Bilirubin: 0.6 mg/dL (ref 0.3–1.2)
Total Protein: 6.3 g/dL — ABNORMAL LOW (ref 6.5–8.1)

## 2020-05-27 LAB — TSH: TSH: 4.507 u[IU]/mL — ABNORMAL HIGH (ref 0.350–4.500)

## 2020-05-27 MED ORDER — SODIUM CHLORIDE 0.9 % IV SOLN
200.0000 mg | Freq: Once | INTRAVENOUS | Status: AC
  Administered 2020-05-27: 200 mg via INTRAVENOUS
  Filled 2020-05-27: qty 8

## 2020-05-27 MED ORDER — TAMSULOSIN HCL 0.4 MG PO CAPS: 0.4000 mg | ORAL_CAPSULE | Freq: Every day | ORAL | 2 refills | Status: DC

## 2020-05-27 MED ORDER — SODIUM CHLORIDE 0.9 % IV SOLN
Freq: Once | INTRAVENOUS | Status: AC
Start: 2020-05-27 — End: 2020-05-27

## 2020-05-27 MED ORDER — HEPARIN SOD (PORK) LOCK FLUSH 100 UNIT/ML IV SOLN
500.0000 [IU] | Freq: Once | INTRAVENOUS | Status: AC | PRN
  Administered 2020-05-27: 500 [IU]

## 2020-05-27 MED ORDER — LEVOTHYROXINE SODIUM 88 MCG PO TABS: 88.0000 ug | ORAL_TABLET | Freq: Every day | ORAL | 3 refills | Status: DC

## 2020-05-27 MED ORDER — SODIUM CHLORIDE 0.9% FLUSH
10.0000 mL | INTRAVENOUS | Status: DC | PRN
  Administered 2020-05-27: 10 mL

## 2020-05-27 MED ORDER — HYDROCODONE-ACETAMINOPHEN 5-325 MG PO TABS: ORAL_TABLET | ORAL | 0 refills | Status: DC

## 2020-05-27 NOTE — Patient Instructions (Signed)
[facility] Discharge Instructions for Patients Receiving Chemotherapy  Today you received the following chemotherapy agents   To help prevent nausea and vomiting after your treatment, we encourage you to take your nausea medication   If you develop nausea and vomiting that is not controlled by your nausea medication, call the clinic.   BELOW ARE SYMPTOMS THAT SHOULD BE REPORTED IMMEDIATELY:  *FEVER GREATER THAN 100.5 F  *CHILLS WITH OR WITHOUT FEVER  NAUSEA AND VOMITING THAT IS NOT CONTROLLED WITH YOUR NAUSEA MEDICATION  *UNUSUAL SHORTNESS OF BREATH  *UNUSUAL BRUISING OR BLEEDING  TENDERNESS IN MOUTH AND THROAT WITH OR WITHOUT PRESENCE OF ULCERS  *URINARY PROBLEMS  *BOWEL PROBLEMS  UNUSUAL RASH Items with * indicate a potential emergency and should be followed up as soon as possible.  Feel free to call the clinic should you have any questions or concerns. The clinic phone number is [phone].  Please show the CHEMO ALERT CARD at check-in to the Emergency Department and triage nurse.   

## 2020-05-27 NOTE — Progress Notes (Signed)
Patients port flushed without difficulty.  Good blood return noted with no bruising or swelling noted at site.  Patient stable during PORT flush.

## 2020-05-27 NOTE — Patient Instructions (Signed)
[facility] at [facility] Discharge Instructions  You were seen today by Dr. Delton Coombes. He went over your recent results. You received your treatment today. You will be prescribed Flomax to take at bedtime. Dr. Delton Coombes will see you back in 3 weeks for labs and follow up.   Thank you for choosing [place] at [facility] to provide your oncology and hematology care.  To afford each patient quality time with our provider, please arrive at least 15 minutes before your scheduled appointment time.   If you have a lab appointment with the [facility] please come in thru the Main Entrance and check in at the main information desk  You need to re-schedule your appointment should you arrive 10 or more minutes late.  We strive to give you quality time with our providers, and arriving late affects you and other patients whose appointments are after yours.  Also, if you no show three or more times for appointments you may be dismissed from the clinic at the providers discretion.     Again, thank you for choosing [facility].  Our hope is that these requests will decrease the amount of time that you wait before being seen by our physicians.       _____________________________________________________________  Should you have questions after your visit to [facility], please contact our office at [phone] between the hours of 8:00 a.m. and 4:30 p.m.  Voicemails left after 4:00 p.m. will not be returned until the following business day.  For prescription refill requests, have your pharmacy contact our office and allow 72 hours.    Cancer Center Support Programs:   > Cancer Support Group  2nd Tuesday of the month 1pm-2pm, Journey Room

## 2020-05-27 NOTE — Progress Notes (Signed)
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   07/28/2014 Initial Diagnosis   Oropharyngeal cancer   08/03/2014 Imaging   CT CAP- L supraclavicular lymphadenopathy is not completely visualized. This is better seen on the previous neck CT from 07/27/2014. Otherwise, no evidence for metastatic disease in the chest, abdomen, or pelvis.   08/03/2014 Imaging   Bone scan- Uptake at adjacent anterior LEFT 6, 7, 8 ribs likely representing trauma/fractures. Questionable nonspecific increased tracer localization at the posterior RIGHT 8th and 9th ribs, the adjacent nature which raises a a question of trauma as well   08/06/2014 Pathology Results   Dr. Benjamine Mola- Oropharynx, biopsy, Left - INVASIVE SQUAMOUS CELL CARCINOMA.   08/12/2014 Procedure   Dr. Enrique Sack- 1. Multiple extraction of tooth numbers 6, 17, 22, 23, 24, 25, 26, and 27. 3 Quadrants of alveoloplasty   08/17/2014 Pathology Results   PORT and G-TUBE placed by Dr. Carlis Stable.   08/26/2014 PET scan   Large hypermetabolic mass in the left base of tongue. Activity extends across midline to the right base tongue. 2. Intensely hypermetabolic left cervical metastatic lymph nodes. Lymph nodes extend from the left level II position to the left supraclavi   09/01/2014 - 09/22/2014 Chemotherapy   Concurrent chemoradiation with Cisplatin 100 mg/m2 x 2 cycles with Neulasta  support. Held cycle #3 d/t renal toxicity.    09/03/2014 - 10/23/2014 Radiation Therapy   Treated in [place], IMRT Isidore Moos).  Base of tongue and bilat neck. Total dose: 70 Gy in 35 fractions. (of note, he did miss several treatments requiring BID dosing towards the end of treatment).    01/25/2015 PET scan   Near complete resolution of metabolic activity at the base of tongue. Minimal residual activity is likely post treatment effect. 2. Complete resolution of metabolic activity above LEFT cervical lymph nodes. No evidence of residual metabolically active    9/62/8366 Procedure   Port-a-cath removed [name])    05/03/2018 - 08/23/2018 Chemotherapy   The patient had dexamethasone (DECADRON) 4 MG tablet, 8 mg, Oral, Daily, 1 of 1 cycle, Start date: 05/01/2018, End date: 10/23/2018 palonosetron (ALOXI) injection 0.25 mg, 0.25 mg, Intravenous,  Once, 6 of 6 cycles Administration: 0.25 mg (05/03/2018), 0.25 mg (05/24/2018), 0.25 mg (06/14/2018), 0.25 mg (07/09/2018), 0.25 mg (07/30/2018), 0.25 mg (08/21/2018) pegfilgrastim-cbqv (UDENYCA) injection 6 mg, 6 mg, Subcutaneous, Once, 5 of 5 cycles Administration: 6 mg (05/27/2018), 6 mg (06/17/2018), 6 mg (07/11/2018), 6 mg (08/01/2018), 6 mg (08/23/2018) CARBOplatin (PARAPLATIN) 380 mg in sodium chloride 0.9 % 250 mL chemo infusion, 380 mg (100 % of original dose 381 mg), Intravenous,  Once, 6 of 6 cycles Dose modification:   (original dose 381 mg, Cycle 1),   (original dose 309.5 mg, Cycle 2), 307.5 mg (original dose 309.5 mg, Cycle 5) Administration: 380 mg (05/03/2018), 310 mg (05/24/2018), 310 mg (06/14/2018), 340 mg (07/09/2018), 310 mg (07/30/2018), 350 mg (08/21/2018) PACLitaxel (TAXOL) 330 mg in sodium chloride 0.9 %  500 mL chemo infusion (> $RemoveBef'80mg'lfHMKqCmLx$ /m2), 175 mg/m2 = 330 mg (100 % of original dose 175 mg/m2), Intravenous,  Once, 6 of 6 cycles Dose modification: 175 mg/m2 (original dose 175 mg/m2, Cycle 1, Reason: Patient Age) Administration: 330 mg (05/03/2018), 330 mg (05/24/2018),  330 mg (06/14/2018), 330 mg (07/09/2018), 330 mg (07/30/2018), 330 mg (08/21/2018)  for chemotherapy treatment.    05/24/2018 -  Chemotherapy      Patient is on Antibody Plan: HEAD/NECK PEMBROLIZUMAB Q21D    Squamous cell lung cancer, left ([facility])  06/14/2018 Initial Diagnosis   Squamous cell lung cancer, left ([facility])     CANCER STAGING: Cancer Staging Oropharyngeal carcinoma ([facility]) Staging form: Pharynx - Oropharynx, AJCC 7th Edition - Clinical: Stage IVA (T4a, N2b, M0) - Unsigned   INTERVAL HISTORY:  [name], a 70 y.o. male, returns for routine follow-up and consideration for next cycle of immunotherapy. Clee was last seen on 03/25/2020.  Due for cycle #35 of Keytruda today.   Overall, he tells me he has been feeling pretty well. He denies having any recent infections, hematuria, urinary frequency or dysuria, though he reports having difficulty with urine flow starting and dribbling and stopping which he noticed since January. He denies having cough, N/V/D/C, and he is drinking 2-4 cans of Ensure and 2 meals per day. His appetite is excellent.  He denies having family history of prostate cancer.  Overall, he feels ready for next cycle of immunotherapy today.    REVIEW OF SYSTEMS:  Review of Systems  Constitutional: Positive for appetite change (75%). Negative for fatigue.  Respiratory: Negative for cough.   Gastrointestinal: Negative for constipation, diarrhea, nausea and vomiting.  Genitourinary: Positive for difficulty urinating (flow starting & stopping). Negative for dysuria, frequency and hematuria.   Musculoskeletal: Positive for arthralgias (9/10 knees pain).  All other systems reviewed and are negative.   PAST MEDICAL/SURGICAL HISTORY:  Past Medical History:  Diagnosis Date  . GERD (gastroesophageal reflux disease)   . 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 Oropharyngeal cancer ([facility]) 07/28/2014   dx. 3 weeks  ago.- Dr. Oneal [facility] [place], [place].  Marland Kitchen 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[facility]) 08/06/2014   SCCa of Left BOT   Past Surgical History:  Procedure Laterality Date  . BIOPSY  01/15/2018   Procedure: BIOPSY;  Surgeon: Danie Binder, MD;  [facility];  Service: Endoscopy;;  gastric  . COLONOSCOPY N/A 03/13/2016   Procedure: COLONOSCOPY;  Surgeon: Danie Binder, MD;  [facility];  Service: Endoscopy;  Laterality: N/A;  2:15 PM  . ESOPHAGOGASTRODUODENOSCOPY (EGD) WITH PROPOFOL N/A 08/17/2014   Procedure: ESOPHAGOGASTRODUODENOSCOPY (EGD) WITH PROPOFOL (procedure #1);  Surgeon: Aviva Signs Md, MD;  Location: [facility];  Service: General;  Laterality: N/A;  . ESOPHAGOGASTRODUODENOSCOPY (EGD) WITH PROPOFOL N/A 01/15/2018   Procedure: ESOPHAGOGASTRODUODENOSCOPY (EGD) WITH PROPOFOL;  Surgeon: Danie Binder, MD;  [facility];  Service: Endoscopy;  Laterality: N/A;  9:30am  . MULTIPLE EXTRACTIONS WITH ALVEOLOPLASTY N/A 08/12/2014   Procedure: Extraction of tooth #'s 6,17,22,23,24,25,26,27 with alveoloplasty;  Surgeon: Lenn Cal, DDS;  Location: [facility];  Service: Oral Surgery;  Laterality: N/A;  . PANENDOSCOPY N/A 08/06/2014   Procedure: PANENDOSCOPY WITH BIOPSY;  Surgeon: Leta Baptist, MD;  Location: [facility];  Service: ENT;  Laterality: N/A;  . PEG PLACEMENT Left 08/17/14  . PEG PLACEMENT N/A 08/17/2014   Procedure: PERCUTANEOUS ENDOSCOPIC  GASTROSTOMY (PEG) PLACEMENT (procedure #1);  Surgeon: Aviva Signs Md, MD;  Location: [facility];  Service: General;  Laterality: N/A;  . PORT-A-CATH REMOVAL Right 07/17/2016   Procedure: MINOR REMOVAL PORT-A-CATH;  Surgeon: Aviva Signs, MD;  Location[facility];  Service: General;  Laterality: Right;  . PORTACATH PLACEMENT Right 08/17/14  . PORTACATH PLACEMENT Right 08/17/2014   Procedure: INSERTION PORT-A-CATH (procedure #2);  Surgeon: Aviva Signs Md, MD;  Location[facility];  Service: General;  Laterality:  Right;  . PORTACATH PLACEMENT Left 04/26/2018   Procedure: INSERTION PORT-A-CATH (attached catheter in left subclavian);  Surgeon: Aviva Signs, MD;  Location[facility];  Service: General;  Laterality: Left;  . SAVORY DILATION N/A 01/15/2018   Procedure: SAVORY DILATION;  Surgeon: Danie Binder, MD;  [facility];  Service: Endoscopy;  Laterality: N/A;  . VIDEO BRONCHOSCOPY WITH ENDOBRONCHIAL ULTRASOUND N/A 04/15/2018   Procedure: VIDEO BRONCHOSCOPY WITH ENDOBRONCHIAL ULTRASOUND;  Surgeon: Melrose Nakayama, MD;  Location: [facility];  Service: Thoracic;  Laterality: N/A;    SOCIAL HISTORY:  Social History   Socioeconomic History  . Marital status: Legally Separated    Spouse name: Not on file  . Number of children: 5  . Years of education: Not on file  . Highest education level: Not on file  Occupational History  . Not on file  Tobacco Use  . Smoking status: Former Smoker    Packs/day: 0.50    Years: 30.00    Pack years: 15.00    Quit date: 07/22/2014    Years since quitting: 5.8  . Smokeless tobacco: Never Used  Substance and Sexual Activity  . Alcohol use: Not Currently    Alcohol/week: 0.0 standard drinks    Comment: None currently (11/09/17); previously 1-2 beers on the weekend  . Drug use: No  . Sexual activity: Not on file  Other Topics Concern  . Not on file  Social History Narrative  . Not on file   Social Determinants of Health   Financial Resource Strain: Low Risk   . Difficulty of Paying Living Expenses: Not hard at all  Food Insecurity: No Food Insecurity  . Worried About Charity fundraiser in the Last Year: Never true  . Ran Out of Food in the Last Year: Never true  Transportation Needs: No Transportation Needs  . Lack of Transportation (Medical): No  . Lack of Transportation (Non-Medical): No  Physical Activity: Inactive  . Days of Exercise per Week: 0 days  . Minutes of Exercise per Session: 0 min  Stress: No Stress Concern Present  .  Feeling of Stress : Not at all  Social Connections: Moderately Isolated  . Frequency of Communication with Friends and Family: More than three times a week  . Frequency of Social Gatherings with Friends and Family: Twice a week  . Attends Religious Services: Never  . Active Member of Clubs or Organizations: No  . Attends Archivist Meetings: Never  . Marital Status: Married  Human resources officer Violence: Not At Risk  . Fear of Current or Ex-Partner: No  . Emotionally Abused: No  . Physically Abused: No  . Sexually Abused: No    FAMILY HISTORY:  Family History  Problem Relation Age of Onset  . Colon cancer Neg Hx   . Gastric cancer Neg Hx   . Esophageal cancer Neg Hx     CURRENT MEDICATIONS:  Current Outpatient Medications  Medication Sig Dispense Refill  . feeding supplement, ENSURE ENLIVE, (ENSURE ENLIVE)  LIQD Take 237 mLs by mouth 4 (four) times daily.     Marland Kitchen HYDROcodone-acetaminophen (NORCO/VICODIN) 5-325 MG tablet TAKE 1 TABLET EVERY 12 HOURS AS NEEDED FOR MODERATE PAIN 60 tablet 0  . levothyroxine (SYNTHROID) 88 MCG tablet Take 1 tablet (88 mcg total) by mouth daily before breakfast. 30 tablet 3  . Melatonin 10 MG TABS Take 1 tablet by mouth at bedtime.    . naproxen sodium (ALEVE) 220 MG tablet Take 220 mg by mouth daily as needed.     Marland Kitchen omeprazole (PRILOSEC) 20 MG capsule TAKE 1 CAPSULE BY MOUTH 30 MINUTES PRIOR TO BREAKFAST 90 capsule 3  . Pembrolizumab (KEYTRUDA IV) Inject into the vein every 21 ( twenty-one) days.    . tamsulosin (FLOMAX) 0.4 MG CAPS capsule Take 0.4 mg by mouth.     No current facility-administered medications for this visit.   Facility-Administered Medications Ordered in Other Visits  Medication Dose Route Frequency Provider Last Rate Last Admin  . sodium chloride flush (NS) 0.9 % injection 10 mL  10 mL Intracatheter PRN Derek Jack, MD   10 mL at 06/24/19 0925    ALLERGIES:  No Known Allergies  PHYSICAL EXAM:  Performance  status (ECOG): 1 - Symptomatic but completely ambulatory  Vitals:   05/27/20 1213  BP: (!) 123/58  Pulse: 72  Resp: 16  Temp: (!) 97 F (36.1 C)  SpO2: 99%   Wt Readings from Last 3 Encounters:  05/27/20 136 lb (61.7 kg)  05/06/20 132 lb 9.6 oz (60.1 kg)  04/16/20 131 lb 12.8 oz (59.8 kg)   Physical Exam Vitals reviewed.  Constitutional:      Appearance: Normal appearance.  Cardiovascular:     Rate and Alexander: Normal rate and regular Alexander.     Pulses: Normal pulses.     Heart sounds: Normal heart sounds.  Pulmonary:     Effort: Pulmonary effort is normal.     Breath sounds: Normal breath sounds.  Chest:     Comments: Port-a-Cath in L chest Neurological:     General: No focal deficit present.     Mental Status: He is alert and oriented to person, place, and time.  Psychiatric:        Mood and Affect: Mood normal.        Behavior: Behavior normal.     LABORATORY DATA:  I have reviewed the labs as listed.  CBC Latest Ref Rng & Units 05/27/2020 05/06/2020 04/16/2020  WBC 4.0 - 10.5 K/uL 8.3 5.4 6.6  Hemoglobin 13.0 - 17.0 g/dL 12.6(L) 12.2(L) 13.5  Hematocrit 39.0 - 52.0 % 39.1 37.1(L) 40.4  Platelets 150 - 400 K/uL 192 164 192   CMP Latest Ref Rng & Units 05/27/2020 05/06/2020 04/16/2020  Glucose 70 - 99 mg/dL 159(H) 94 73  BUN 8 - 23 mg/dL 39(H) 27(H) 31(H)  Creatinine 0.61 - 1.24 mg/dL 1.68(H) 1.81(H) 1.81(H)  Sodium 135 - 145 mmol/L 137 136 137  Potassium 3.5 - 5.1 mmol/L 4.7 4.2 3.9  Chloride 98 - 111 mmol/L 103 104 103  CO2 22 - 32 mmol/L _0 Calcium 8.9 - 10.3 mg/dL 8.5(L) 8.8(L) 9.0  Total Protein 6.5 - 8.1 g/dL 6.3(L) 6.0(L) 6.2(L)  Total Bilirubin 0.3 - 1.2 mg/dL 0.6 0.7 <0.1(L)  Alkaline Phos 38 - 126 U/L 56 45 45  AST 15 - 41 U/L _1 ALT 0 - 44 U/L 33 24 31    DIAGNOSTIC IMAGING:  I have independently reviewed the  scans and discussed with the patient. No results found.   ASSESSMENT:  1. Advanced squamous cell carcinoma of the left  lung: -PD-L1 not done, foundation 1 MS-stable, no other targetable mutations. -6 cycles of carboplatin, paclitaxel and pembrolizumab from 05/03/2018 through 08/21/2018. -Maintenance pembrolizumab started on 09/11/2018. -PET scan on 09/15/2019 showed interval decrease in hypermetabolic areas associated with tongue and floor of the mouth. Hypermetabolic metastatic lymphadenopathy in the chest is stable. No new sites seen. -PET scan on 03/15/2020 shows persistent, stable hypermetabolism in the tongue/floor of mouth.  Slight interval decrease in hypermetabolism with mediastinal/hilar adenopathy.  Persistent hypermetabolic focus in the right supraclavicular region.  New focus of hypermetabolic them identified in the right external iliac chain of pelvis with no discernible adenopathy on the CT.  2. 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   PLAN:  1. Advanced squamous cell carcinoma of the left lung: -He denies any immunotherapy related side effects. -Reviewed his labs which showed normal LFTs.  CBC was adequate. -He will proceed with Keytruda today.  2. Hypothyroidism: -Continue Synthroid 88 mcg daily.  TSH is 4.5.  3. 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  4. Bilateral knee pains: -Continue hydrocodone as needed.  5. Nutrition: -Continue 2 to 4 cans of boost per day.  He is eating 2 meals per day.  Weight is stable.  6. CKD: -Creatinine is 1.68 and stable.  7.  Elevated PSA: -He was found to have elevated PSA on 05/17/2020 of 51.80. -His PSA was baseline around 30 in 2020. -He reported about 59-monthhistory of dribbling of urine and unable to evacuate the bladder completely.  Denies any hematuria. -We will start him on Flomax 0.4 mg daily.  We will plan to repeat PSA in 3 weeks.    Orders placed this encounter:  Orders Placed This Encounter  Procedures  . PSA     SDerek Jack MD [phone]  I, DMilinda Antis am acting as a scribe for Dr. SSanda Linger  I, SDerek JackMD, have reviewed the above documentation for accuracy and completeness, and I agree with the above.

## 2020-05-27 NOTE — Progress Notes (Signed)
Patient presents today for Keytruda infusion.  Labs pending. Vital signs within parameters for treatment.  No new complaints since last visit.  Labs reviewed and creatinine noted to be 1.68.  Patient okay for treatment per Dr. Delton Coombes.  Treatment given today per MD orders.  Stable during infusion without adverse affects.  Vital signs stable.  No complaints at this time.  Discharge from clinic ambulatory in stable condition.  Alert and oriented X 3.  Follow up with [facility] as scheduled.

## 2020-05-27 NOTE — Progress Notes (Signed)
Patient was assessed by Dr. Katragadda and labs have been reviewed.  Patient is okay to proceed with treatment today. Primary RN and pharmacy aware.   

## 2020-05-28 ENCOUNTER — Other Ambulatory Visit (HOSPITAL_COMMUNITY): Payer: Self-pay

## 2020-05-28 DIAGNOSIS — C109 Malignant neoplasm of oropharynx, unspecified: Secondary | ICD-10-CM

## 2020-05-28 DIAGNOSIS — C3492 Malignant neoplasm of unspecified part of left bronchus or lung: Secondary | ICD-10-CM

## 2020-06-12 ENCOUNTER — Other Ambulatory Visit: Payer: Self-pay | Admitting: Gastroenterology

## 2020-06-17 ENCOUNTER — Inpatient Hospital Stay (HOSPITAL_COMMUNITY): Payer: Medicare HMO

## 2020-06-17 ENCOUNTER — Other Ambulatory Visit: Payer: Self-pay

## 2020-06-17 ENCOUNTER — Other Ambulatory Visit (HOSPITAL_COMMUNITY): Payer: Self-pay

## 2020-06-17 ENCOUNTER — Inpatient Hospital Stay (HOSPITAL_BASED_OUTPATIENT_CLINIC_OR_DEPARTMENT_OTHER): Payer: Medicare HMO | Admitting: Hematology

## 2020-06-17 VITALS — BP 119/69 | HR 69

## 2020-06-17 VITALS — BP 111/61 | HR 71 | Temp 98.6°F | Resp 17 | Wt 134.2 lb

## 2020-06-17 DIAGNOSIS — Z79899 Other long term (current) drug therapy: Secondary | ICD-10-CM | POA: Diagnosis not present

## 2020-06-17 DIAGNOSIS — M25562 Pain in left knee: Secondary | ICD-10-CM | POA: Diagnosis not present

## 2020-06-17 DIAGNOSIS — Z87891 Personal history of nicotine dependence: Secondary | ICD-10-CM | POA: Diagnosis not present

## 2020-06-17 DIAGNOSIS — C3492 Malignant neoplasm of unspecified part of left bronchus or lung: Secondary | ICD-10-CM | POA: Diagnosis not present

## 2020-06-17 DIAGNOSIS — N189 Chronic kidney disease, unspecified: Secondary | ICD-10-CM | POA: Diagnosis not present

## 2020-06-17 DIAGNOSIS — M25561 Pain in right knee: Secondary | ICD-10-CM | POA: Diagnosis not present

## 2020-06-17 DIAGNOSIS — Z85810 Personal history of malignant neoplasm of tongue: Secondary | ICD-10-CM | POA: Diagnosis not present

## 2020-06-17 DIAGNOSIS — C109 Malignant neoplasm of oropharynx, unspecified: Secondary | ICD-10-CM

## 2020-06-17 DIAGNOSIS — Z5112 Encounter for antineoplastic immunotherapy: Secondary | ICD-10-CM | POA: Diagnosis not present

## 2020-06-17 DIAGNOSIS — E039 Hypothyroidism, unspecified: Secondary | ICD-10-CM | POA: Diagnosis not present

## 2020-06-17 DIAGNOSIS — R972 Elevated prostate specific antigen [PSA]: Secondary | ICD-10-CM | POA: Diagnosis not present

## 2020-06-17 LAB — CBC WITH DIFFERENTIAL/PLATELET
Abs Immature Granulocytes: 0 10*3/uL (ref 0.00–0.07)
Basophils Absolute: 0 10*3/uL (ref 0.0–0.1)
Basophils Relative: 1 %
Eosinophils Absolute: 0.2 10*3/uL (ref 0.0–0.5)
Eosinophils Relative: 4 %
HCT: 40.3 % (ref 39.0–52.0)
Hemoglobin: 13 g/dL (ref 13.0–17.0)
Immature Granulocytes: 0 %
Lymphocytes Relative: 32 %
Lymphs Abs: 1.3 10*3/uL (ref 0.7–4.0)
MCH: 30.7 pg (ref 26.0–34.0)
MCHC: 32.3 g/dL (ref 30.0–36.0)
MCV: 95.3 fL (ref 80.0–100.0)
Monocytes Absolute: 0.5 10*3/uL (ref 0.1–1.0)
Monocytes Relative: 12 %
Neutro Abs: 2.1 10*3/uL (ref 1.7–7.7)
Neutrophils Relative %: 51 %
Platelets: 199 10*3/uL (ref 150–400)
RBC: 4.23 MIL/uL (ref 4.22–5.81)
RDW: 15.4 % (ref 11.5–15.5)
WBC: 4 10*3/uL (ref 4.0–10.5)
nRBC: 0 % (ref 0.0–0.2)

## 2020-06-17 LAB — COMPREHENSIVE METABOLIC PANEL
ALT: 20 U/L (ref 0–44)
AST: 23 U/L (ref 15–41)
Albumin: 3.5 g/dL (ref 3.5–5.0)
Alkaline Phosphatase: 49 U/L (ref 38–126)
Anion gap: 9 (ref 5–15)
BUN: 25 mg/dL — ABNORMAL HIGH (ref 8–23)
CO2: 25 mmol/L (ref 22–32)
Calcium: 8.6 mg/dL — ABNORMAL LOW (ref 8.9–10.3)
Chloride: 104 mmol/L (ref 98–111)
Creatinine, Ser: 1.6 mg/dL — ABNORMAL HIGH (ref 0.61–1.24)
GFR, Estimated: 46 mL/min — ABNORMAL LOW (ref >60)
Glucose, Bld: 92 mg/dL (ref 70–99)
Potassium: 4.3 mmol/L (ref 3.5–5.1)
Sodium: 138 mmol/L (ref 135–145)
Total Bilirubin: 0.7 mg/dL (ref 0.3–1.2)
Total Protein: 6.5 g/dL (ref 6.5–8.1)

## 2020-06-17 LAB — TSH: TSH: 3.197 u[IU]/mL (ref 0.350–4.500)

## 2020-06-17 LAB — PSA: Prostatic Specific Antigen: 55.81 ng/mL — ABNORMAL HIGH (ref 0.00–4.00)

## 2020-06-17 LAB — MAGNESIUM: Magnesium: 2.1 mg/dL (ref 1.7–2.4)

## 2020-06-17 MED ORDER — SODIUM CHLORIDE 0.9 % IV SOLN
200.0000 mg | Freq: Once | INTRAVENOUS | Status: AC
  Administered 2020-06-17: 200 mg via INTRAVENOUS
  Filled 2020-06-17: qty 8

## 2020-06-17 MED ORDER — SODIUM CHLORIDE 0.9 % IV SOLN: Freq: Once | INTRAVENOUS | Status: AC

## 2020-06-17 MED ORDER — HEPARIN SOD (PORK) LOCK FLUSH 100 UNIT/ML IV SOLN
500.0000 [IU] | Freq: Once | INTRAVENOUS | Status: AC | PRN
Start: 2020-06-17 — End: 2020-06-17
  Administered 2020-06-17: 500 [IU]

## 2020-06-17 MED ORDER — SODIUM CHLORIDE 0.9% FLUSH
10.0000 mL | INTRAVENOUS | Status: DC | PRN
  Administered 2020-06-17: 10 mL

## 2020-06-17 MED ORDER — HYDROCODONE-ACETAMINOPHEN 5-325 MG PO TABS: ORAL_TABLET | ORAL | 0 refills | Status: DC

## 2020-06-17 NOTE — Progress Notes (Signed)
Patient was assessed by Dr. Katragadda and labs have been reviewed.  Patient is okay to proceed with treatment today. Primary RN and pharmacy aware.   

## 2020-06-17 NOTE — Progress Notes (Signed)
ASEEM SESSUMS presents today for D1C36 . Pt denies any new changes or symptoms since last treatment. Lab results, including Cr 1.6, and vitals have been reviewed and are stable and within parameters for treatment. Patient has been assessed by Dr. Delton Coombes who has approved proceeding with treatment today as planned.  Infusions tolerated without incident or complaint. VSS upon completion of treatment. Port flushed and deaccessed per protocol, see MAR and IV flowsheet for details. Discharged in satisfactory condition with follow up instructions.

## 2020-06-17 NOTE — Patient Instructions (Signed)
[facility] Discharge Instructions for Patients Receiving Chemotherapy   Beginning January 23rd 2017 lab work for the [facility] will be done in the  Main lab at [facility] on 1st floor. If you have a lab appointment with the [facility] please come in thru the  Main Entrance and check in at the main information desk   Today you received the following chemotherapy agents Keytruda  To help prevent nausea and vomiting after your treatment, we encourage you to take your nausea medication    If you develop nausea and vomiting, or diarrhea that is not controlled by your medication, call the clinic.  The clinic phone number is [phone]. Office hours are Monday-Friday 8:30am-5:00pm.  BELOW ARE SYMPTOMS THAT SHOULD BE REPORTED IMMEDIATELY:  *FEVER GREATER THAN 101.0 F  *CHILLS WITH OR WITHOUT FEVER  NAUSEA AND VOMITING THAT IS NOT CONTROLLED WITH YOUR NAUSEA MEDICATION  *UNUSUAL SHORTNESS OF BREATH  *UNUSUAL BRUISING OR BLEEDING  TENDERNESS IN MOUTH AND THROAT WITH OR WITHOUT PRESENCE OF ULCERS  *URINARY PROBLEMS  *BOWEL PROBLEMS  UNUSUAL RASH Items with * indicate a potential emergency and should be followed up as soon as possible. If you have an emergency after office hours please contact your primary care physician or go to the nearest emergency department.  Please call the clinic during office hours if you have any questions or concerns.   You may also contact the Patient Navigator at [phone] should you have any questions or need assistance in obtaining follow up care.      Resources For Cancer Patients and their Caregivers ? American Cancer Society: Can assist with transportation, wigs, general needs, runs Look Good Feel Better.        407-371-4580 ? Cancer Care: Provides financial assistance, online support groups, medication/co-pay assistance.  1-800-813-HOPE 639-210-4884) ? [facility] [place]  cancer patients and their families through emotional , educational and financial support.  510-012-2176 ? [place] DSS Where to apply for food stamps, Medicaid and utility assistance. 580-680-2810 ? [facility]: Transportation to medical appointments. 3307498878 ? Social Security Administration: May apply for disability if have a Stage IV cancer. 463 038 7303 312-012-6632 ? [employer], Disability and Transit Services: Assists with nutrition, care and transit needs. 628-056-7210

## 2020-06-17 NOTE — Progress Notes (Signed)
[name], [place] [address]   CLINIC:  Medical Oncology/Hematology  PCP:  Lemmie Evens, MD [place]. 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   07/28/2014 Initial Diagnosis   Oropharyngeal cancer   08/03/2014 Imaging   CT CAP- L supraclavicular lymphadenopathy is not completely visualized. This is better seen on the previous neck CT from 07/27/2014. Otherwise, no evidence for metastatic disease in the chest, abdomen, or pelvis.   08/03/2014 Imaging   Bone scan- Uptake at adjacent anterior LEFT 6, 7, 8 ribs likely representing trauma/fractures. Questionable nonspecific increased tracer localization at the posterior RIGHT 8th and 9th ribs, the adjacent nature which raises a a question of trauma as well   08/06/2014 Pathology Results   Dr. Benjamine Mola- Oropharynx, biopsy, Left - INVASIVE SQUAMOUS CELL CARCINOMA.   08/12/2014 Procedure   Dr. Enrique Sack- 1. Multiple extraction of tooth numbers 6, 17, 22, 23, 24, 25, 26, and 27. 3 Quadrants of alveoloplasty   08/17/2014 Pathology Results   PORT and G-TUBE placed by Dr. Carlis Stable.   08/26/2014 PET scan   Large hypermetabolic mass in the left base of tongue. Activity extends across midline to the right base tongue. 2. Intensely hypermetabolic left cervical metastatic lymph nodes. Lymph nodes extend from the left level II position to the left supraclavi   09/01/2014 - 09/22/2014 Chemotherapy   Concurrent chemoradiation with Cisplatin 100 mg/m2 x 2 cycles with Neulasta  support. Held cycle #3 d/t renal toxicity.    09/03/2014 - 10/23/2014 Radiation Therapy   Treated in [place], IMRT Isidore Moos).  Base of tongue and bilat neck. Total dose: 70 Gy in 35 fractions. (of note, he did miss several treatments requiring BID dosing towards the end of treatment).    01/25/2015 PET scan   Near complete resolution of metabolic activity at the base of tongue. Minimal residual activity is likely post treatment effect. 2. Complete resolution of metabolic activity above LEFT cervical lymph nodes. No evidence of residual metabolically active    2/77/4128 Procedure   Port-a-cath removed [name])    05/03/2018 - 08/23/2018 Chemotherapy   The patient had dexamethasone (DECADRON) 4 MG tablet, 8 mg, Oral, Daily, 1 of 1 cycle, Start date: 05/01/2018, End date: 10/23/2018 palonosetron (ALOXI) injection 0.25 mg, 0.25 mg, Intravenous,  Once, 6 of 6 cycles Administration: 0.25 mg (05/03/2018), 0.25 mg (05/24/2018), 0.25 mg (06/14/2018), 0.25 mg (07/09/2018), 0.25 mg (07/30/2018), 0.25 mg (08/21/2018) pegfilgrastim-cbqv (UDENYCA) injection 6 mg, 6 mg, Subcutaneous, Once, 5 of 5 cycles Administration: 6 mg (05/27/2018), 6 mg (06/17/2018), 6 mg (07/11/2018), 6 mg (08/01/2018), 6 mg (08/23/2018) CARBOplatin (PARAPLATIN) 380 mg in sodium chloride 0.9 % 250 mL chemo infusion, 380 mg (100 % of original dose 381 mg), Intravenous,  Once, 6 of 6 cycles Dose modification:   (original dose 381 mg, Cycle 1),   (original dose 309.5 mg, Cycle 2), 307.5 mg (original dose 309.5 mg, Cycle 5) Administration: 380 mg (05/03/2018), 310 mg (05/24/2018), 310 mg (06/14/2018), 340 mg (07/09/2018), 310 mg (07/30/2018), 350 mg (08/21/2018) PACLitaxel (TAXOL) 330 mg in sodium chloride 0.9 %  500 mL chemo infusion (> $RemoveBef'80mg'jGDveMYQHj$ /m2), 175 mg/m2 = 330 mg (100 % of original dose 175 mg/m2), Intravenous,  Once, 6 of 6 cycles Dose modification: 175 mg/m2 (original dose 175 mg/m2, Cycle 1, Reason: Patient Age) Administration: 330 mg (05/03/2018), 330 mg (05/24/2018),  330 mg (06/14/2018), 330 mg (07/09/2018), 330 mg (07/30/2018), 330 mg (08/21/2018)  for chemotherapy treatment.    05/24/2018 -  Chemotherapy      Patient is on Antibody Plan: HEAD/NECK PEMBROLIZUMAB Q21D    Squamous cell lung cancer, left ([facility])  06/14/2018 Initial Diagnosis   Squamous cell lung cancer, left ([facility])     CANCER STAGING: Cancer Staging Oropharyngeal carcinoma ([facility]) Staging form: Pharynx - Oropharynx, AJCC 7th Edition - Clinical: Stage IVA (T4a, N2b, M0) - Unsigned   INTERVAL HISTORY:  [name], a 70 y.o. male, returns for routine follow-up and consideration for next cycle of immunotherapy. Alexander [name] was last seen on 05/27/2020.  Due for cycle #36 of Keytruda today.   Overall, he tells me he has been feeling pretty well. He started taking Flomax and his urinating is starting to improve, though his flow still slows down mid-stream. He continues having chronic bilateral knee pain, but denies having any new pains. He denies having any new cough or diarrhea.  Overall, he feels ready for next cycle of immunotherapy today.    REVIEW OF SYSTEMS:  Review of Systems  Constitutional: Negative for appetite change and fatigue.  Respiratory: Negative for cough.   Gastrointestinal: Negative for diarrhea.  Genitourinary: Positive for difficulty urinating (improving).   Musculoskeletal: Positive for arthralgias (7/10 bilat knee pain). Negative for myalgias.  All other systems reviewed and are negative.   PAST MEDICAL/SURGICAL HISTORY:  Past Medical History:  Diagnosis Date  . GERD (gastroesophageal reflux disease)   . 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 Oropharyngeal cancer ([facility]) 07/28/2014   dx. 3 weeks ago.- Dr. Oneal [facility] [place], [place].  Marland Kitchen 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[facility]) 08/06/2014   SCCa of Left BOT   Past Surgical History:  Procedure Laterality Date  . BIOPSY  01/15/2018   Procedure:  BIOPSY;  Surgeon: Danie Binder, MD;  [facility];  Service: Endoscopy;;  gastric  . COLONOSCOPY N/A 03/13/2016   Procedure: COLONOSCOPY;  Surgeon: Danie Binder, MD;  [facility];  Service: Endoscopy;  Laterality: N/A;  2:15 PM  . ESOPHAGOGASTRODUODENOSCOPY (EGD) WITH PROPOFOL N/A 08/17/2014   Procedure: ESOPHAGOGASTRODUODENOSCOPY (EGD) WITH PROPOFOL (procedure #1);  Surgeon: Aviva Signs Md, MD;  Location: [facility];  Service: General;  Laterality: N/A;  . ESOPHAGOGASTRODUODENOSCOPY (EGD) WITH PROPOFOL N/A 01/15/2018   Procedure: ESOPHAGOGASTRODUODENOSCOPY (EGD) WITH PROPOFOL;  Surgeon: Danie Binder, MD;  [facility];  Service: Endoscopy;  Laterality: N/A;  9:30am  . MULTIPLE EXTRACTIONS WITH ALVEOLOPLASTY N/A 08/12/2014   Procedure: Extraction of tooth #'s 6,17,22,23,24,25,26,27 with alveoloplasty;  Surgeon: Lenn Cal, DDS;  Location: [facility];  Service: Oral Surgery;  Laterality: N/A;  . PANENDOSCOPY N/A 08/06/2014   Procedure: PANENDOSCOPY WITH BIOPSY;  Surgeon: Leta Baptist, MD;  Location: [facility];  Service: ENT;  Laterality: N/A;  . PEG PLACEMENT Left 08/17/14  . PEG PLACEMENT N/A 08/17/2014   Procedure: PERCUTANEOUS ENDOSCOPIC GASTROSTOMY (PEG) PLACEMENT (procedure #1);  Surgeon: Aviva Signs Md, MD;  Location: [facility];  Service: General;  Laterality: N/A;  . PORT-A-CATH REMOVAL Right 07/17/2016   Procedure: MINOR REMOVAL  PORT-A-CATH;  Surgeon: Aviva Signs, MD;  Location[facility];  Service: General;  Laterality: Right;  . PORTACATH PLACEMENT Right 08/17/14  . PORTACATH PLACEMENT Right 08/17/2014   Procedure: INSERTION PORT-A-CATH (procedure #2);  Surgeon: Aviva Signs Md, MD;  Location: [facility];  Service: General;  Laterality: Right;  . PORTACATH PLACEMENT Left 04/26/2018   Procedure: INSERTION PORT-A-CATH (attached catheter in left subclavian);  Surgeon: Aviva Signs, MD;  Location: [facility];  Service: General;  Laterality: Left;  . SAVORY  DILATION N/A 01/15/2018   Procedure: SAVORY DILATION;  Surgeon: Danie Binder, MD;  [facility];  Service: Endoscopy;  Laterality: N/A;  . VIDEO BRONCHOSCOPY WITH ENDOBRONCHIAL ULTRASOUND N/A 04/15/2018   Procedure: VIDEO BRONCHOSCOPY WITH ENDOBRONCHIAL ULTRASOUND;  Surgeon: Melrose Nakayama, MD;  Location: [facility];  Service: Thoracic;  Laterality: N/A;    SOCIAL HISTORY:  Social History   Socioeconomic History  . Marital status: Legally Separated    Spouse name: Not on file  . Number of children: 5  . Years of education: Not on file  . Highest education level: Not on file  Occupational History  . Not on file  Tobacco Use  . Smoking status: Former Smoker    Packs/day: 0.50    Years: 30.00    Pack years: 15.00    Quit date: 07/22/2014    Years since quitting: 5.9  . Smokeless tobacco: Never Used  Substance and Sexual Activity  . Alcohol use: Not Currently    Alcohol/week: 0.0 standard drinks    Comment: None currently (11/09/17); previously 1-2 beers on the weekend  . Drug use: No  . Sexual activity: Not on file  Other Topics Concern  . Not on file  Social History Narrative  . Not on file   Social Determinants of Health   Financial Resource Strain: Low Risk   . Difficulty of Paying Living Expenses: Not hard at all  Food Insecurity: No Food Insecurity  . Worried About Charity fundraiser in the Last Year: Never true  . Ran Out of Food in the Last Year: Never true  Transportation Needs: No Transportation Needs  . Lack of Transportation (Medical): No  . Lack of Transportation (Non-Medical): No  Physical Activity: Inactive  . Days of Exercise per Week: 0 days  . Minutes of Exercise per Session: 0 min  Stress: No Stress Concern Present  . Feeling of Stress : Not at all  Social Connections: Moderately Isolated  . Frequency of Communication with Friends and Family: More than three times a week  . Frequency of Social Gatherings with Friends and Family: Twice  a week  . Attends Religious Services: Never  . Active Member of Clubs or Organizations: No  . Attends Archivist Meetings: Never  . Marital Status: Married  Human resources officer Violence: Not At Risk  . Fear of Current or Ex-Partner: No  . Emotionally Abused: No  . Physically Abused: No  . Sexually Abused: No    FAMILY HISTORY:  Family History  Problem Relation Age of Onset  . Colon cancer Neg Hx   . Gastric cancer Neg Hx   . Esophageal cancer Neg Hx     CURRENT MEDICATIONS:  Current Outpatient Medications  Medication Sig Dispense Refill  . feeding supplement, ENSURE ENLIVE, (ENSURE ENLIVE) LIQD Take 237 mLs by mouth 4 (four) times daily.     Marland Kitchen levothyroxine (SYNTHROID) 88 MCG tablet Take 1 tablet (88 mcg total) by mouth daily before breakfast. 30  tablet 3  . Melatonin 10 MG TABS Take 1 tablet by mouth at bedtime.    . naproxen sodium (ALEVE) 220 MG tablet Take 220 mg by mouth daily as needed.     Marland Kitchen omeprazole (PRILOSEC) 20 MG capsule TAKE 1 CAPSULE BY MOUTH 30 MINUTES PRIOR TO BREAKFAST 90 capsule 3  . Pembrolizumab (KEYTRUDA IV) Inject into the vein every 21 ( twenty-one) days.    . tamsulosin (FLOMAX) 0.4 MG CAPS capsule Take 1 capsule (0.4 mg total) by mouth at bedtime. 30 capsule 2  . HYDROcodone-acetaminophen (NORCO/VICODIN) 5-325 MG tablet TAKE 1 TABLET EVERY 12 HOURS AS NEEDED FOR MODERATE PAIN 60 tablet 0   No current facility-administered medications for this visit.   Facility-Administered Medications Ordered in Other Visits  Medication Dose Route Frequency Provider Last Rate Last Admin  . 0.9 %  sodium chloride infusion   Intravenous Once Doreatha Massed, MD      . heparin lock flush 100 unit/mL  500 Units Intracatheter Once PRN Doreatha Massed, MD      . pembrolizumab [facility]) 200 mg in sodium chloride 0.9 % 50 mL chemo infusion  200 mg Intravenous Once Doreatha Massed, MD      . sodium chloride flush (NS) 0.9 % injection 10 mL  10 mL  Intracatheter PRN Doreatha Massed, MD   10 mL at 06/24/19 0925  . sodium chloride flush (NS) 0.9 % injection 10 mL  10 mL Intracatheter PRN Doreatha Massed, MD        ALLERGIES:  No Known Allergies  PHYSICAL EXAM:  Performance status (ECOG): 1 - Symptomatic but completely ambulatory  Vitals:   06/17/20 1211  BP: 111/61  Pulse: 71  Resp: 17  Temp: 98.6 F (37 C)  SpO2: 97%   Wt Readings from Last 3 Encounters:  06/17/20 134 lb 4 oz (60.9 kg)  05/27/20 136 lb (61.7 kg)  05/06/20 132 lb 9.6 oz (60.1 kg)   Physical Exam Vitals reviewed.  Constitutional:      Appearance: Normal appearance.  Cardiovascular:     Rate and Rhythm: Normal rate and regular rhythm.     Pulses: Normal pulses.     Heart sounds: Normal heart sounds.  Pulmonary:     Effort: Pulmonary effort is normal.     Breath sounds: Normal breath sounds.  Chest:     Comments: Port-a-Cath in L chest Abdominal:     Palpations: Abdomen is soft. There is no hepatomegaly, splenomegaly or mass.     Tenderness: There is no abdominal tenderness.     Hernia: No hernia is present.  Musculoskeletal:     Right lower leg: No edema.     Left lower leg: No edema.  Lymphadenopathy:     Lower Body: No right inguinal adenopathy. No left inguinal adenopathy.  Neurological:     General: No focal deficit present.     Mental Status: He is alert and oriented to person, place, and time.  Psychiatric:        Mood and Affect: Mood normal.        Behavior: Behavior normal.     LABORATORY DATA:  I have reviewed the labs as listed.  CBC Latest Ref Rng & Units 06/17/2020 05/27/2020 05/06/2020  WBC 4.0 - 10.5 K/uL 4.0 8.3 5.4  Hemoglobin 13.0 - 17.0 g/dL 64.3 12.6(L) 12.2(L)  Hematocrit 39.0 - 52.0 % 40.3 39.1 37.1(L)  Platelets 150 - 400 K/uL 199 192 164   CMP Latest Ref Rng & Units 06/17/2020  05/27/2020 05/06/2020  Glucose 70 - 99 mg/dL 92 159(H) 94  BUN 8 - 23 mg/dL 25(H) 39(H) 27(H)  Creatinine 0.61 - 1.24 mg/dL 1.60(H)  1.68(H) 1.81(H)  Sodium 135 - 145 mmol/L 138 137 136  Potassium 3.5 - 5.1 mmol/L 4.3 4.7 4.2  Chloride 98 - 111 mmol/L 104 103 104  CO2 22 - 32 mmol/L $RemoveB'25 24 25  'AdSXnTjG$ Calcium 8.9 - 10.3 mg/dL 8.6(L) 8.5(L) 8.8(L)  Total Protein 6.5 - 8.1 g/dL 6.5 6.3(L) 6.0(L)  Total Bilirubin 0.3 - 1.2 mg/dL 0.7 0.6 0.7  Alkaline Phos 38 - 126 U/L 49 56 45  AST 15 - 41 U/L $Remo'23 26 23  'zyQJF$ ALT 0 - 44 U/L 20 33 24    DIAGNOSTIC IMAGING:  I have independently reviewed the scans and discussed with the patient. No results found.   ASSESSMENT:  1. 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 -6 cycles of carboplatin, paclitaxel and pembrolizumab from 05/03/2018 through 08/21/2018. -Maintenance pembrolizumab started on 09/11/2018. -PET scan on 09/15/2019 showed interval decrease in hypermetabolic areas associated with tongue and floor of the mouth. Hypermetabolic metastatic lymphadenopathy in the chest is stable. No new sites seen. -PET scan on 03/15/2020 shows persistent, stable hypermetabolism in the tongue/floor of mouth. Slight interval decrease in hypermetabolism with mediastinal/hilar adenopathy. Persistent hypermetabolic focus in the right supraclavicular region. New focus of hypermetabolic them identified in the right external iliac chain of pelvis with no discernible adenopathy on the CT.  2. 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   PLAN:  1. 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 -His labs show normal LFTs and electrolytes.  CBC was normal. -He will proceed with next cycle of Keytruda. -RTC 3 weeks for follow-up.  2. Hypothyroidism: -Continue Synthroid 88 mcg daily.  TSH today is 3.19.  3. Stage IVa base of the tongue squamous cell  carcinoma: -Last PET scan on 03/15/2020 did not show any evidence of recurrence.  4. Bilateral knee pains: -Continue hydrocodone as needed.  We have given refill.  5. Nutrition: -Continue 2 to 4 cans of boost per day.  He eats 2 meals per day.  6. CKD: -Creatinine is 1.60 and stable.  7.  Elevated PSA: -Baseline PSA around 13 2020.  PSA found to be 51.8 on 05/17/2020 in [place]. -I have started him on Flomax 0.4 mg daily 3 weeks ago.  This has improved his dribbling and incomplete evacuation. -PSA today is still elevated at 55.8. -We will obtain bone scan.  We will also order CT CAP with contrast.   Orders placed this encounter:  No orders of the defined types were placed in this encounter.    Derek Jack, MD [facility] [phone]   I, Milinda Antis, am acting as a scribe for Dr. Sanda Linger.  I, Derek Jack MD, have reviewed the above documentation for accuracy and completeness, and I agree with the above.

## 2020-06-17 NOTE — Patient Instructions (Signed)
[facility] at [facility] Discharge Instructions  You were seen today by Dr. Katragadda. He went over your recent results. You received your treatment today. Dr. Katragadda will see you back in 3 weeks for labs and follow up.   Thank you for choosing [facility] at [facility] to provide your oncology and hematology care.  To afford each patient quality time with our provider, please arrive at least 15 minutes before your scheduled appointment time.   If you have a lab appointment with the Cancer Center please come in thru the Main Entrance and check in at the main information desk  You need to re-schedule your appointment should you arrive 10 or more minutes late.  We strive to give you quality time with our providers, and arriving late affects you and other patients whose appointments are after yours.  Also, if you no show three or more times for appointments you may be dismissed from the clinic at the providers discretion.     Again, thank you for choosing [facility].  Our hope is that these requests will decrease the amount of time that you wait before being seen by our physicians.       _____________________________________________________________  Should you have questions after your visit to [facility], please contact our office at [phone] between the hours of 8:00 a.m. and 4:30 p.m.  Voicemails left after 4:00 p.m. will not be returned until the following business day.  For prescription refill requests, have your pharmacy contact our office and allow 72 hours.    Cancer Center Support Programs:   > Cancer Support Group  2nd Tuesday of the month 1pm-2pm, Journey Room    

## 2020-06-21 ENCOUNTER — Other Ambulatory Visit (HOSPITAL_COMMUNITY): Payer: Self-pay

## 2020-06-21 DIAGNOSIS — C109 Malignant neoplasm of oropharynx, unspecified: Secondary | ICD-10-CM

## 2020-06-21 DIAGNOSIS — C3492 Malignant neoplasm of unspecified part of left bronchus or lung: Secondary | ICD-10-CM

## 2020-06-21 NOTE — Progress Notes (Signed)
Ct ca

## 2020-06-28 ENCOUNTER — Ambulatory Visit (HOSPITAL_COMMUNITY): Admission: RE | Admit: 2020-06-28 | Payer: Medicare HMO | Source: Ambulatory Visit

## 2020-06-30 ENCOUNTER — Encounter (HOSPITAL_COMMUNITY): Payer: Medicaid Other

## 2020-07-08 ENCOUNTER — Inpatient Hospital Stay (HOSPITAL_COMMUNITY): Payer: Medicare Other

## 2020-07-08 ENCOUNTER — Inpatient Hospital Stay (HOSPITAL_COMMUNITY): Payer: Medicare Other | Attending: Hematology

## 2020-07-08 ENCOUNTER — Other Ambulatory Visit: Payer: Self-pay

## 2020-07-08 ENCOUNTER — Inpatient Hospital Stay (HOSPITAL_BASED_OUTPATIENT_CLINIC_OR_DEPARTMENT_OTHER): Payer: Medicare Other | Admitting: Hematology

## 2020-07-08 VITALS — BP 111/58 | HR 73 | Temp 97.1°F | Resp 18 | Wt 135.4 lb

## 2020-07-08 VITALS — BP 102/59 | HR 65 | Temp 97.1°F | Resp 18

## 2020-07-08 DIAGNOSIS — Z85810 Personal history of malignant neoplasm of tongue: Secondary | ICD-10-CM | POA: Diagnosis not present

## 2020-07-08 DIAGNOSIS — M25562 Pain in left knee: Secondary | ICD-10-CM | POA: Diagnosis not present

## 2020-07-08 DIAGNOSIS — E039 Hypothyroidism, unspecified: Secondary | ICD-10-CM | POA: Diagnosis not present

## 2020-07-08 DIAGNOSIS — C3492 Malignant neoplasm of unspecified part of left bronchus or lung: Secondary | ICD-10-CM | POA: Insufficient documentation

## 2020-07-08 DIAGNOSIS — Z5112 Encounter for antineoplastic immunotherapy: Secondary | ICD-10-CM | POA: Diagnosis present

## 2020-07-08 DIAGNOSIS — M25561 Pain in right knee: Secondary | ICD-10-CM | POA: Diagnosis not present

## 2020-07-08 DIAGNOSIS — Z79899 Other long term (current) drug therapy: Secondary | ICD-10-CM | POA: Diagnosis not present

## 2020-07-08 DIAGNOSIS — C109 Malignant neoplasm of oropharynx, unspecified: Secondary | ICD-10-CM

## 2020-07-08 DIAGNOSIS — Z923 Personal history of irradiation: Secondary | ICD-10-CM | POA: Diagnosis not present

## 2020-07-08 DIAGNOSIS — Z9221 Personal history of antineoplastic chemotherapy: Secondary | ICD-10-CM | POA: Insufficient documentation

## 2020-07-08 DIAGNOSIS — Z87891 Personal history of nicotine dependence: Secondary | ICD-10-CM | POA: Diagnosis not present

## 2020-07-08 DIAGNOSIS — N189 Chronic kidney disease, unspecified: Secondary | ICD-10-CM | POA: Insufficient documentation

## 2020-07-08 DIAGNOSIS — R972 Elevated prostate specific antigen [PSA]: Secondary | ICD-10-CM | POA: Insufficient documentation

## 2020-07-08 LAB — COMPREHENSIVE METABOLIC PANEL
ALT: 19 U/L (ref 0–44)
AST: 22 U/L (ref 15–41)
Albumin: 3.6 g/dL (ref 3.5–5.0)
Alkaline Phosphatase: 52 U/L (ref 38–126)
Anion gap: 10 (ref 5–15)
BUN: 39 mg/dL — ABNORMAL HIGH (ref 8–23)
CO2: 24 mmol/L (ref 22–32)
Calcium: 8.9 mg/dL (ref 8.9–10.3)
Chloride: 104 mmol/L (ref 98–111)
Creatinine, Ser: 1.77 mg/dL — ABNORMAL HIGH (ref 0.61–1.24)
GFR, Estimated: 41 mL/min — ABNORMAL LOW (ref >60)
Glucose, Bld: 112 mg/dL — ABNORMAL HIGH (ref 70–99)
Potassium: 4.3 mmol/L (ref 3.5–5.1)
Sodium: 138 mmol/L (ref 135–145)
Total Bilirubin: 0.4 mg/dL (ref 0.3–1.2)
Total Protein: 6.7 g/dL (ref 6.5–8.1)

## 2020-07-08 LAB — CBC WITH DIFFERENTIAL/PLATELET
Abs Immature Granulocytes: 0.01 10*3/uL (ref 0.00–0.07)
Basophils Absolute: 0 10*3/uL (ref 0.0–0.1)
Basophils Relative: 0 %
Eosinophils Absolute: 0.1 10*3/uL (ref 0.0–0.5)
Eosinophils Relative: 2 %
HCT: 38.9 % — ABNORMAL LOW (ref 39.0–52.0)
Hemoglobin: 12.4 g/dL — ABNORMAL LOW (ref 13.0–17.0)
Immature Granulocytes: 0 %
Lymphocytes Relative: 23 %
Lymphs Abs: 1.2 10*3/uL (ref 0.7–4.0)
MCH: 30.6 pg (ref 26.0–34.0)
MCHC: 31.9 g/dL (ref 30.0–36.0)
MCV: 96 fL (ref 80.0–100.0)
Monocytes Absolute: 0.6 10*3/uL (ref 0.1–1.0)
Monocytes Relative: 12 %
Neutro Abs: 3.3 10*3/uL (ref 1.7–7.7)
Neutrophils Relative %: 63 %
Platelets: 192 10*3/uL (ref 150–400)
RBC: 4.05 MIL/uL — ABNORMAL LOW (ref 4.22–5.81)
RDW: 15 % (ref 11.5–15.5)
WBC: 5.2 10*3/uL (ref 4.0–10.5)
nRBC: 0 % (ref 0.0–0.2)

## 2020-07-08 LAB — TSH: TSH: 3.803 u[IU]/mL (ref 0.350–4.500)

## 2020-07-08 MED ORDER — HEPARIN SOD (PORK) LOCK FLUSH 100 UNIT/ML IV SOLN
500.0000 [IU] | Freq: Once | INTRAVENOUS | Status: AC | PRN
  Administered 2020-07-08: 500 [IU]

## 2020-07-08 MED ORDER — SODIUM CHLORIDE 0.9 % IV SOLN
Freq: Once | INTRAVENOUS | Status: AC
Start: 2020-07-08 — End: 2020-07-08

## 2020-07-08 MED ORDER — SODIUM CHLORIDE 0.9 % IV SOLN
200.0000 mg | Freq: Once | INTRAVENOUS | Status: AC
  Administered 2020-07-08: 200 mg via INTRAVENOUS
  Filled 2020-07-08: qty 8

## 2020-07-08 MED ORDER — SODIUM CHLORIDE 0.9% FLUSH
10.0000 mL | INTRAVENOUS | Status: DC | PRN
  Administered 2020-07-08: 10 mL

## 2020-07-08 NOTE — Progress Notes (Signed)
[name], [place] [address]   CLINIC:  Medical Oncology/Hematology  PCP:  Lemmie Evens, MD Alexander [name]. 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   07/28/2014 Initial Diagnosis   Oropharyngeal cancer   08/03/2014 Imaging   CT CAP- L supraclavicular lymphadenopathy is not completely visualized. This is better seen on the previous neck CT from 07/27/2014. Otherwise, no evidence for metastatic disease in the chest, abdomen, or pelvis.   08/03/2014 Imaging   Bone scan- Uptake at adjacent anterior LEFT 6, 7, 8 ribs likely representing trauma/fractures. Questionable nonspecific increased tracer localization at the posterior RIGHT 8th and 9th ribs, the adjacent nature which raises a a question of trauma as well   08/06/2014 Pathology Results   Dr. Benjamine Mola- Oropharynx, biopsy, Left - INVASIVE SQUAMOUS CELL CARCINOMA.   08/12/2014 Procedure   Dr. Enrique Sack- 1. Multiple extraction of tooth numbers 6, 17, 22, 23, 24, 25, 26, and 27. 3 Quadrants of alveoloplasty   08/17/2014 Pathology Results   PORT and G-TUBE placed by Dr. Carlis Stable.   08/26/2014 PET scan   Large hypermetabolic mass in the left base of tongue. Activity extends across midline to the right base tongue. 2. Intensely hypermetabolic left cervical metastatic lymph nodes. Lymph nodes extend from the left level II position to the left supraclavi   09/01/2014 - 09/22/2014 Chemotherapy   Concurrent chemoradiation with Cisplatin 100 mg/m2 x 2 cycles with Neulasta  support. Held cycle #3 d/t renal toxicity.    09/03/2014 - 10/23/2014 Radiation Therapy   Treated in Alexander [name], IMRT Alexander [name]).  Base of tongue and bilat neck. Total dose: 70 Gy in 35 fractions. (of note, he did miss several treatments requiring BID dosing towards the end of treatment).    01/25/2015 PET scan   Near complete resolution of metabolic activity at the base of tongue. Minimal residual activity is likely post treatment effect. 2. Complete resolution of metabolic activity above LEFT cervical lymph nodes. No evidence of residual metabolically active    2/77/4128 Procedure   Port-a-cath removed Alexander [name])    05/03/2018 - 08/23/2018 Chemotherapy   The patient had dexamethasone (DECADRON) 4 MG tablet, 8 mg, Oral, Daily, 1 of 1 cycle, Start date: 05/01/2018, End date: 10/23/2018 palonosetron (ALOXI) injection 0.25 mg, 0.25 mg, Intravenous,  Once, 6 of 6 cycles Administration: 0.25 mg (05/03/2018), 0.25 mg (05/24/2018), 0.25 mg (06/14/2018), 0.25 mg (07/09/2018), 0.25 mg (07/30/2018), 0.25 mg (08/21/2018) pegfilgrastim-cbqv (UDENYCA) injection 6 mg, 6 mg, Subcutaneous, Once, 5 of 5 cycles Administration: 6 mg (05/27/2018), 6 mg (06/17/2018), 6 mg (07/11/2018), 6 mg (08/01/2018), 6 mg (08/23/2018) CARBOplatin (PARAPLATIN) 380 mg in sodium chloride 0.9 % 250 mL chemo infusion, 380 mg (100 % of original dose 381 mg), Intravenous,  Once, 6 of 6 cycles Dose modification:   (original dose 381 mg, Cycle 1),   (original dose 309.5 mg, Cycle 2), 307.5 mg (original dose 309.5 mg, Cycle 5) Administration: 380 mg (05/03/2018), 310 mg (05/24/2018), 310 mg (06/14/2018), 340 mg (07/09/2018), 310 mg (07/30/2018), 350 mg (08/21/2018) PACLitaxel (TAXOL) 330 mg in sodium chloride 0.9 %  500 mL chemo infusion (> 4m/m2), 175 mg/m2 = 330 mg (100 % of original dose 175 mg/m2), Intravenous,  Once, 6 of 6 cycles Dose modification: 175 mg/m2 (original dose 175 mg/m2, Cycle 1, Reason: Patient Age) Administration: 330 mg (05/03/2018), 330 mg (05/24/2018),  330 mg (06/14/2018), 330 mg (07/09/2018), 330 mg (07/30/2018), 330 mg (08/21/2018)  for chemotherapy treatment.    05/24/2018 -  Chemotherapy      Patient is on Antibody Plan: HEAD/NECK PEMBROLIZUMAB Q21D    Squamous cell lung cancer, left ([facility]  06/14/2018 Initial Diagnosis   Squamous cell lung cancer, left (HCC)     CANCER STAGING: Cancer Staging Oropharyngeal carcinoma ([place] Staging form: Pharynx - Oropharynx, AJCC 7th Edition - Clinical: Stage IVA (T4a, N2b, M0) - Unsigned   INTERVAL HISTORY:  Alexander [name] a 70y.o. male, returns for routine follow-up and consideration for next cycle of immunotherapy. CYussufwas last seen on 06/17/2020.  Due for cycle #37 of Keytruda today.   Overall, he tells me he has been feeling okay. He is taking Flomax at bedtime and he denies having issues with his urinary stream. He denies having any new cough. He tolerated the previous treatment well.  Overall, he feels ready for next cycle of immunotherapy today.    REVIEW OF SYSTEMS:  Review of Systems  Constitutional: Positive for appetite change (75%) and fatigue (75%).  Genitourinary: Negative for difficulty urinating.   Musculoskeletal: Positive for arthralgias (5/10 knee pain).  All other systems reviewed and are negative.   PAST MEDICAL/SURGICAL HISTORY:  Past Medical History:  Diagnosis Date  . GERD (gastroesophageal reflux disease)   . 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 [place] ([facility] 07/28/2014   dx. 3 weeks ago.- [place] [place]  .Marland Kitchen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[facility] [place] 08/06/2014   SCCa of Left BOT   Past Surgical History:  Procedure Laterality Date  . BIOPSY  01/15/2018   Procedure: BIOPSY;  Surgeon: FDanie Binder MD;  [facility];  Service: Endoscopy;;  gastric  . COLONOSCOPY N/A 03/13/2016   Procedure: COLONOSCOPY;  Surgeon: SDanie Binder MD;  [facility];  Service: Endoscopy;  Laterality: N/A;  2:15 PM  . ESOPHAGOGASTRODUODENOSCOPY (EGD) WITH PROPOFOL N/A 08/17/2014   Procedure: ESOPHAGOGASTRODUODENOSCOPY (EGD) WITH PROPOFOL (procedure #1);  Surgeon: MAviva SignsMd, MD;  Location: [facility];  Service: General;  Laterality: N/A;  . ESOPHAGOGASTRODUODENOSCOPY (EGD) WITH PROPOFOL N/A 01/15/2018   Procedure: ESOPHAGOGASTRODUODENOSCOPY (EGD) WITH PROPOFOL;  Surgeon: FDanie Binder MD;  [facility];  Service: Endoscopy;  Laterality: N/A;  9:30am  . MULTIPLE EXTRACTIONS WITH ALVEOLOPLASTY N/A 08/12/2014   Procedure: Extraction of tooth #'s 6,17,22,23,24,25,26,27 with alveoloplasty;  Surgeon: RLenn Cal DDS;  Location: [facility];  Service: Oral Surgery;  Laterality: N/A;  . PANENDOSCOPY N/A 08/06/2014   Procedure: PANENDOSCOPY WITH BIOPSY;  Surgeon: SLeta Baptist MD;  [place] [facility]  Service: ENT;  Laterality: N/A;  . PEG PLACEMENT Left 08/17/14  . PEG PLACEMENT N/A 08/17/2014   Procedure: PERCUTANEOUS ENDOSCOPIC GASTROSTOMY (PEG) PLACEMENT (procedure #1);  Surgeon: MAviva SignsMd, MD;  Location: [facility];  Service: General;  Laterality: N/A;  . PORT-A-CATH REMOVAL Right 07/17/2016   Procedure: MINOR REMOVAL PORT-A-CATH;  Surgeon: MAviva Signs MD;  Location: [facility];  Service: General;  Laterality: Right;  . PORTACATH PLACEMENT Right 08/17/14  . PORTACATH PLACEMENT Right  08/17/2014   Procedure: INSERTION PORT-A-CATH (procedure #2);  Surgeon: Aviva Signs Md, MD;  Location: [facility];  Service: General;  Laterality: Right;  . PORTACATH PLACEMENT Left 04/26/2018   Procedure: INSERTION PORT-A-CATH (attached catheter in left subclavian);  Surgeon: Aviva Signs, MD;  Location: [facility];  Service: General;  Laterality: Left;  . SAVORY DILATION N/A 01/15/2018   Procedure: SAVORY DILATION;  Surgeon: Danie Binder, MD;  [facility];  Service: Endoscopy;  Laterality: N/A;  . VIDEO BRONCHOSCOPY WITH ENDOBRONCHIAL  ULTRASOUND N/A 04/15/2018   Procedure: VIDEO BRONCHOSCOPY WITH ENDOBRONCHIAL ULTRASOUND;  Surgeon: Melrose Nakayama, MD;  Location: [facility];  Service: Thoracic;  Laterality: N/A;    SOCIAL HISTORY:  Social History   Socioeconomic History  . Marital status: Legally Separated    Spouse name: Not on file  . Number of children: 5  . Years of education: Not on file  . Highest education level: Not on file  Occupational History  . Not on file  Tobacco Use  . Smoking status: Former Smoker    Packs/day: 0.50    Years: 30.00    Pack years: 15.00    Quit date: 07/22/2014    Years since quitting: 5.9  . Smokeless tobacco: Never Used  Substance and Sexual Activity  . Alcohol use: Not Currently    Alcohol/week: 0.0 standard drinks    Comment: None currently (11/09/17); previously 1-2 beers on the weekend  . Drug use: No  . Sexual activity: Not on file  Other Topics Concern  . Not on file  Social History Narrative  . Not on file   Social Determinants of Health   Financial Resource Strain: Low Risk   . Difficulty of Paying Living Expenses: Not hard at all  Food Insecurity: No Food Insecurity  . Worried About Charity fundraiser in the Last Year: Never true  . Ran Out of Food in the Last Year: Never true  Transportation Needs: No Transportation Needs  . Lack of Transportation (Medical): No  . Lack of Transportation (Non-Medical): No  Physical Activity: Inactive  . Days of Exercise per Week: 0 days  . Minutes of Exercise per Session: 0 min  Stress: No Stress Concern Present  . Feeling of Stress : Not at all  Social Connections: Moderately Isolated  . Frequency of Communication with Friends and Family: More than three times a week  . Frequency of Social Gatherings with Friends and Family: Twice a week  . Attends Religious Services: Never  . Active Member of Clubs or Organizations: No  . Attends Archivist Meetings: Never  . Marital Status: Married  Human resources officer  Violence: Not At Risk  . Fear of Current or Ex-Partner: No  . Emotionally Abused: No  . Physically Abused: No  . Sexually Abused: No    FAMILY HISTORY:  Family History  Problem Relation Age of Onset  . Colon cancer Neg Hx   . Gastric cancer Neg Hx   . Esophageal cancer Neg Hx     CURRENT MEDICATIONS:  Current Outpatient Medications  Medication Sig Dispense Refill  . feeding supplement, ENSURE ENLIVE, (ENSURE ENLIVE) LIQD Take 237 mLs by mouth 4 (four) times daily.     Marland Kitchen HYDROcodone-acetaminophen (NORCO/VICODIN) 5-325 MG tablet TAKE 1 TABLET EVERY 12 HOURS AS NEEDED FOR MODERATE PAIN 60 tablet 0  . levothyroxine (SYNTHROID) 88 MCG tablet Take 1 tablet (88 mcg total) by mouth daily before breakfast. 30 tablet 3  . Melatonin 10  MG TABS Take 1 tablet by mouth at bedtime.    . naproxen sodium (ALEVE) 220 MG tablet Take 220 mg by mouth daily as needed.     Marland Kitchen omeprazole (PRILOSEC) 20 MG capsule TAKE 1 CAPSULE BY MOUTH 30 MINUTES PRIOR TO BREAKFAST 90 capsule 0  . Pembrolizumab (KEYTRUDA IV) Inject into the vein every 21 ( twenty-one) days.    . tamsulosin (FLOMAX) 0.4 MG CAPS capsule Take 1 capsule (0.4 mg total) by mouth at bedtime. 30 capsule 2   No current facility-administered medications for this visit.   Facility-Administered Medications Ordered in Other Visits  Medication Dose Route Frequency Provider Last Rate Last Admin  . sodium chloride flush (NS) 0.9 % injection 10 mL  10 mL Intracatheter PRN Derek Jack, MD   10 mL at 06/24/19 0925    ALLERGIES:  No Known Allergies  PHYSICAL EXAM:  Performance status (ECOG): 1 - Symptomatic but completely ambulatory  Vitals:   07/08/20 1225  BP: (!) 111/58  Pulse: 73  Resp: 18  Temp: (!) 97.1 F (36.2 C)  SpO2: 100%   Wt Readings from Last 3 Encounters:  07/08/20 135 lb 6.4 oz (61.4 kg)  06/17/20 134 lb 4 oz (60.9 kg)  05/27/20 136 lb (61.7 kg)   Physical Exam Vitals reviewed.  Constitutional:      Appearance:  Normal appearance.  Cardiovascular:     Rate and Rhythm: Normal rate and regular rhythm.     Pulses: Normal pulses.     Heart sounds: Normal heart sounds.  Pulmonary:     Effort: Pulmonary effort is normal.     Breath sounds: Normal breath sounds.  Chest:     Comments: Port-a-Cath in L chest Neurological:     General: No focal deficit present.     Mental Status: He is alert and oriented to person, place, and time.  Psychiatric:        Mood and Affect: Mood normal.        Behavior: Behavior normal.     LABORATORY DATA:  I have reviewed the labs as listed.  CBC Latest Ref Rng & Units 07/08/2020 06/17/2020 05/27/2020  WBC 4.0 - 10.5 K/uL 5.2 4.0 8.3  Hemoglobin 13.0 - 17.0 g/dL 12.4(L) 13.0 12.6(L)  Hematocrit 39.0 - 52.0 % 38.9(L) 40.3 39.1  Platelets 150 - 400 K/uL 192 199 192   CMP Latest Ref Rng & Units 07/08/2020 06/17/2020 05/27/2020  Glucose 70 - 99 mg/dL 112(H) 92 159(H)  BUN 8 - 23 mg/dL 39(H) 25(H) 39(H)  Creatinine 0.61 - 1.24 mg/dL 1.77(H) 1.60(H) 1.68(H)  Sodium 135 - 145 mmol/L 138 138 137  Potassium 3.5 - 5.1 mmol/L 4.3 4.3 4.7  Chloride 98 - 111 mmol/L 104 104 103  CO2 22 - 32 mmol/L _0 Calcium 8.9 - 10.3 mg/dL 8.9 8.6(L) 8.5(L)  Total Protein 6.5 - 8.1 g/dL 6.7 6.5 6.3(L)  Total Bilirubin 0.3 - 1.2 mg/dL 0.4 0.7 0.6  Alkaline Phos 38 - 126 U/L 52 49 56  AST 15 - 41 U/L _1 ALT 0 - 44 U/L 19 20 33    DIAGNOSTIC IMAGING:  I have independently reviewed the scans and discussed with the patient. No results found.   ASSESSMENT:  1. 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 -6 cycles of carboplatin, paclitaxel and pembrolizumab from 05/03/2018 through 08/21/2018. -Maintenance pembrolizumab started on 09/11/2018. -PET scan on 09/15/2019 showed  interval decrease in hypermetabolic areas associated with tongue and floor of the mouth. Hypermetabolic metastatic lymphadenopathy in  the chest is stable. No new sites seen. -PET scan on 03/15/2020 shows persistent, stable hypermetabolism in the tongue/floor of mouth. Slight interval decrease in hypermetabolism with mediastinal/hilar adenopathy. Persistent hypermetabolic focus in the right supraclavicular region. New focus of hypermetabolic them identified in the right external iliac chain of pelvis with no discernible adenopathy on the CT.  2. 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   PLAN:  1. 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 -Reviewed his labs today which showed normal LFTs.  CBC was also grossly normal.  TSH was 3.8. -He will proceed with Keytruda.  I plan to see him back in 3 weeks for follow-up with repeat scans.  2. Hypothyroidism: -Continue Synthroid 88 mcg daily.  TSH today is 3.19.  3. 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  4. Bilateral knee pains: -Continue hydrocodone twice daily as needed.  5. Nutrition: -Continue 2 to 4 cans of boost per day.  He continues to eat 2 meals per day.  6. CKD: -Creatinine today is 1.77.  Baseline creatinine between 1.5-1.8.  7. Elevated PSA: -Baseline PSA around 13 in 2020.  PSA found to be elevated at 51.8 on 05/17/2020 in [place]. -He is using Flomax with improvement in the urine stream. -His PSA repeated showed 55.8. -Hence I have recommended CT CAP and bone scan for staging. -We will also request urology for biopsy.   Orders placed this encounter:  No orders of the defined types were placed in this encounter.    Derek Jack, MD [place] [phone]   I, Milinda Antis, am acting as a scribe for Dr. Sanda Linger.  I, Derek Jack MD,  have reviewed the above documentation for accuracy and completeness, and I agree with the above.

## 2020-07-08 NOTE — Patient Instructions (Signed)
[place] at [facility]  Discharge Instructions:  You received [place] today.  Return as scheduled.  _______________________________________________________________  Thank you for choosing [place] at [facility] to provide your oncology and hematology care.  To afford each patient quality time with our providers, please arrive at least 15 minutes before your scheduled appointment.  You need to re-schedule your appointment if you arrive 10 or more minutes late.  We strive to give you quality time with our providers, and arriving late affects you and other patients whose appointments are after yours.  Also, if you no show three or more times for appointments you may be dismissed from the clinic.  Again, thank you for choosing [place] at [place] hope is that these requests will allow you access to exceptional care and in a timely manner. _______________________________________________________________  If you have questions after your visit, please contact our office at [phone] between the hours of 8:30 a.m. and 5:00 p.m. Voicemails left after 4:30 p.m. will not be returned until the following business day. _______________________________________________________________  For prescription refill requests, have your pharmacy contact our office. _______________________________________________________________  Recommendations made by the consultant and any test results will be sent to your referring physician. _______________________________________________________________

## 2020-07-08 NOTE — Progress Notes (Signed)
Patient tolerated therapy with no complaints voiced.  Side effects with management reviewed with understanding verbalized.  Port site clean and dry with no bruising or swelling noted at site.  Good blood return noted before and after administration of therapy.  Band aid applied.  Patient left in satisfactory condition with VSS and no s/s of distress noted.

## 2020-07-08 NOTE — Patient Instructions (Signed)
[place] at [facility] Discharge Instructions  You were seen today by Dr. Delton Coombes. He went over your recent results. You received your treatment today. You will be scheduled to have a CT scan of your chest and abdomen and a bone scan done before your next visit. Dr. Delton Coombes will see you back in 3 weeks for labs and follow up.   Thank you for choosing [place] at [facility] to provide your oncology and hematology care.  To afford each patient quality time with our provider, please arrive at least 15 minutes before your scheduled appointment time.   If you have a lab appointment with the [facility] please come in thru the Main Entrance and check in at the main information desk  You need to re-schedule your appointment should you arrive 10 or more minutes late.  We strive to give you quality time with our providers, and arriving late affects you and other patients whose appointments are after yours.  Also, if you no show three or more times for appointments you may be dismissed from the clinic at the providers discretion.     Again, thank you for choosing [facility].  Our hope is that these requests will decrease the amount of time that you wait before being seen by our physicians.       _____________________________________________________________  Should you have questions after your visit to [facility], please contact our office at [phone] between the hours of 8:00 a.m. and 4:30 p.m.  Voicemails left after 4:00 p.m. will not be returned until the following business day.  For prescription refill requests, have your pharmacy contact our office and allow 72 hours.    Cancer Center Support Programs:   > Cancer Support Group  2nd Tuesday of the month 1pm-2pm, Journey Room

## 2020-07-08 NOTE — Progress Notes (Signed)
Patient was assessed by Dr. Katragadda and labs have been reviewed.  Patient is okay to proceed with treatment today. Primary RN and pharmacy aware.   

## 2020-07-20 ENCOUNTER — Encounter (HOSPITAL_COMMUNITY)
Admission: RE | Admit: 2020-07-20 | Discharge: 2020-07-20 | Disposition: A | Discharge status: Home / Self Care | Payer: Medicare Other | Source: Ambulatory Visit | Attending: Hematology | Admitting: Hematology

## 2020-07-20 ENCOUNTER — Ambulatory Visit (HOSPITAL_COMMUNITY)
Admission: RE | Admit: 2020-07-20 | Discharge: 2020-07-20 | Disposition: A | Discharge status: Home / Self Care | Payer: Medicare Other | Source: Ambulatory Visit | Attending: Hematology | Admitting: Hematology

## 2020-07-20 ENCOUNTER — Other Ambulatory Visit: Payer: Self-pay

## 2020-07-20 ENCOUNTER — Encounter (HOSPITAL_COMMUNITY): Payer: Self-pay

## 2020-07-20 DIAGNOSIS — C109 Malignant neoplasm of oropharynx, unspecified: Secondary | ICD-10-CM | POA: Diagnosis present

## 2020-07-20 DIAGNOSIS — C3492 Malignant neoplasm of unspecified part of left bronchus or lung: Secondary | ICD-10-CM | POA: Insufficient documentation

## 2020-07-20 MED ORDER — TECHNETIUM TC 99M MEDRONATE IV KIT
20.0000 | PACK | Freq: Once | INTRAVENOUS | Status: AC | PRN
  Administered 2020-07-20: 20.5 via INTRAVENOUS

## 2020-07-23 ENCOUNTER — Other Ambulatory Visit (HOSPITAL_COMMUNITY): Payer: Self-pay

## 2020-07-23 DIAGNOSIS — C3492 Malignant neoplasm of unspecified part of left bronchus or lung: Secondary | ICD-10-CM

## 2020-07-23 MED ORDER — HYDROCODONE-ACETAMINOPHEN 5-325 MG PO TABS: ORAL_TABLET | ORAL | 0 refills | Status: DC

## 2020-07-28 ENCOUNTER — Ambulatory Visit (HOSPITAL_COMMUNITY): Payer: Medicare Other

## 2020-07-29 ENCOUNTER — Inpatient Hospital Stay (HOSPITAL_COMMUNITY): Payer: Medicare Other

## 2020-07-29 ENCOUNTER — Other Ambulatory Visit: Payer: Self-pay

## 2020-07-29 ENCOUNTER — Inpatient Hospital Stay (HOSPITAL_COMMUNITY): Payer: Medicare Other | Attending: Hematology

## 2020-07-29 ENCOUNTER — Inpatient Hospital Stay (HOSPITAL_BASED_OUTPATIENT_CLINIC_OR_DEPARTMENT_OTHER): Payer: Medicare Other | Admitting: Hematology

## 2020-07-29 ENCOUNTER — Other Ambulatory Visit (HOSPITAL_COMMUNITY): Payer: Self-pay | Admitting: *Deleted

## 2020-07-29 VITALS — BP 91/54 | HR 65 | Temp 97.3°F | Resp 18 | Wt 136.6 lb

## 2020-07-29 DIAGNOSIS — Z9221 Personal history of antineoplastic chemotherapy: Secondary | ICD-10-CM | POA: Diagnosis not present

## 2020-07-29 DIAGNOSIS — Z5112 Encounter for antineoplastic immunotherapy: Secondary | ICD-10-CM | POA: Insufficient documentation

## 2020-07-29 DIAGNOSIS — M25561 Pain in right knee: Secondary | ICD-10-CM | POA: Insufficient documentation

## 2020-07-29 DIAGNOSIS — Z923 Personal history of irradiation: Secondary | ICD-10-CM | POA: Insufficient documentation

## 2020-07-29 DIAGNOSIS — M25562 Pain in left knee: Secondary | ICD-10-CM | POA: Insufficient documentation

## 2020-07-29 DIAGNOSIS — N189 Chronic kidney disease, unspecified: Secondary | ICD-10-CM | POA: Insufficient documentation

## 2020-07-29 DIAGNOSIS — R972 Elevated prostate specific antigen [PSA]: Secondary | ICD-10-CM

## 2020-07-29 DIAGNOSIS — C109 Malignant neoplasm of oropharynx, unspecified: Secondary | ICD-10-CM

## 2020-07-29 DIAGNOSIS — Z87891 Personal history of nicotine dependence: Secondary | ICD-10-CM | POA: Insufficient documentation

## 2020-07-29 DIAGNOSIS — C3492 Malignant neoplasm of unspecified part of left bronchus or lung: Secondary | ICD-10-CM | POA: Insufficient documentation

## 2020-07-29 DIAGNOSIS — E039 Hypothyroidism, unspecified: Secondary | ICD-10-CM | POA: Diagnosis not present

## 2020-07-29 DIAGNOSIS — Z85810 Personal history of malignant neoplasm of tongue: Secondary | ICD-10-CM | POA: Diagnosis not present

## 2020-07-29 DIAGNOSIS — Z79899 Other long term (current) drug therapy: Secondary | ICD-10-CM | POA: Insufficient documentation

## 2020-07-29 LAB — TSH: TSH: 3.936 u[IU]/mL (ref 0.350–4.500)

## 2020-07-29 LAB — COMPREHENSIVE METABOLIC PANEL
ALT: 69 U/L — ABNORMAL HIGH (ref 0–44)
AST: 58 U/L — ABNORMAL HIGH (ref 15–41)
Albumin: 3.5 g/dL (ref 3.5–5.0)
Alkaline Phosphatase: 55 U/L (ref 38–126)
Anion gap: 8 (ref 5–15)
BUN: 35 mg/dL — ABNORMAL HIGH (ref 8–23)
CO2: 26 mmol/L (ref 22–32)
Calcium: 9 mg/dL (ref 8.9–10.3)
Chloride: 106 mmol/L (ref 98–111)
Creatinine, Ser: 1.72 mg/dL — ABNORMAL HIGH (ref 0.61–1.24)
GFR, Estimated: 42 mL/min — ABNORMAL LOW (ref >60)
Glucose, Bld: 115 mg/dL — ABNORMAL HIGH (ref 70–99)
Potassium: 4.3 mmol/L (ref 3.5–5.1)
Sodium: 140 mmol/L (ref 135–145)
Total Bilirubin: 0.5 mg/dL (ref 0.3–1.2)
Total Protein: 6.4 g/dL — ABNORMAL LOW (ref 6.5–8.1)

## 2020-07-29 LAB — CBC WITH DIFFERENTIAL/PLATELET
Abs Immature Granulocytes: 0.02 10*3/uL (ref 0.00–0.07)
Basophils Absolute: 0 10*3/uL (ref 0.0–0.1)
Basophils Relative: 0 %
Eosinophils Absolute: 0.1 10*3/uL (ref 0.0–0.5)
Eosinophils Relative: 2 %
HCT: 38.7 % — ABNORMAL LOW (ref 39.0–52.0)
Hemoglobin: 12.7 g/dL — ABNORMAL LOW (ref 13.0–17.0)
Immature Granulocytes: 0 %
Lymphocytes Relative: 19 %
Lymphs Abs: 0.9 10*3/uL (ref 0.7–4.0)
MCH: 31.3 pg (ref 26.0–34.0)
MCHC: 32.8 g/dL (ref 30.0–36.0)
MCV: 95.3 fL (ref 80.0–100.0)
Monocytes Absolute: 0.5 10*3/uL (ref 0.1–1.0)
Monocytes Relative: 11 %
Neutro Abs: 3.2 10*3/uL (ref 1.7–7.7)
Neutrophils Relative %: 68 %
Platelets: 172 10*3/uL (ref 150–400)
RBC: 4.06 MIL/uL — ABNORMAL LOW (ref 4.22–5.81)
RDW: 14.5 % (ref 11.5–15.5)
WBC: 4.8 10*3/uL (ref 4.0–10.5)
nRBC: 0 % (ref 0.0–0.2)

## 2020-07-29 MED ORDER — SODIUM CHLORIDE 0.9 % IV SOLN: Freq: Once | INTRAVENOUS | Status: AC

## 2020-07-29 MED ORDER — SODIUM CHLORIDE 0.9% FLUSH
10.0000 mL | INTRAVENOUS | Status: DC | PRN
  Administered 2020-07-29: 10 mL

## 2020-07-29 MED ORDER — HEPARIN SOD (PORK) LOCK FLUSH 100 UNIT/ML IV SOLN
500.0000 [IU] | Freq: Once | INTRAVENOUS | Status: AC | PRN
  Administered 2020-07-29: 500 [IU]

## 2020-07-29 MED ORDER — SODIUM CHLORIDE 0.9 % IV SOLN
200.0000 mg | Freq: Once | INTRAVENOUS | Status: AC
  Administered 2020-07-29: 200 mg via INTRAVENOUS
  Filled 2020-07-29: qty 8

## 2020-07-29 NOTE — Progress Notes (Signed)
Patient has been assessed by Dr Delton Coombes.  Labs reviewed.  No distress noted.

## 2020-07-29 NOTE — Patient Instructions (Signed)
[facility]  Discharge Instructions: Thank you for choosing [facility] to provide your oncology and hematology care.  If you have a lab appointment with the Cancer Center, please come in thru the Main Entrance and check in at the main information desk.  Wear comfortable clothing and clothing appropriate for easy access to any Portacath or PICC line.   We strive to give you quality time with your provider. You may need to reschedule your appointment if you arrive late (15 or more minutes).  Arriving late affects you and other patients whose appointments are after yours.  Also, if you miss three or more appointments without notifying the office, you may be dismissed from the clinic at the provider's discretion.      For prescription refill requests, have your pharmacy contact our office and allow 72 hours for refills to be completed.        To help prevent nausea and vomiting after your treatment, we encourage you to take your nausea medication as directed.  BELOW ARE SYMPTOMS THAT SHOULD BE REPORTED IMMEDIATELY: *FEVER GREATER THAN 100.4 F (38 C) OR HIGHER *CHILLS OR SWEATING *NAUSEA AND VOMITING THAT IS NOT CONTROLLED WITH YOUR NAUSEA MEDICATION *UNUSUAL SHORTNESS OF BREATH *UNUSUAL BRUISING OR BLEEDING *URINARY PROBLEMS (pain or burning when urinating, or frequent urination) *BOWEL PROBLEMS (unusual diarrhea, constipation, pain near the anus) TENDERNESS IN MOUTH AND THROAT WITH OR WITHOUT PRESENCE OF ULCERS (sore throat, sores in mouth, or a toothache) UNUSUAL RASH, SWELLING OR PAIN  UNUSUAL VAGINAL DISCHARGE OR ITCHING   Items with * indicate a potential emergency and should be followed up as soon as possible or go to the Emergency Department if any problems should occur.  Please show the CHEMOTHERAPY ALERT CARD or IMMUNOTHERAPY ALERT CARD at check-in to the Emergency Department and triage nurse.  Should you have questions after your visit or need to cancel  or reschedule your appointment, please contact [facility] [phone]  and follow the prompts.  Office hours are 8:00 a.m. to 4:30 p.m. Monday - Friday. Please note that voicemails left after 4:00 p.m. may not be returned until the following business day.  We are closed weekends and major holidays. You have access to a nurse at all times for urgent questions. Please call the main number to the clinic [phone] and follow the prompts.  For any non-urgent questions, you may also contact your provider using MyChart. We now offer e-Visits for anyone 18 and older to request care online for non-urgent symptoms. For details visit [email or web address].   Also download the MyChart app! Go to the app store, search "MyChart", open the app, select [facility], and log in with your MyChart username and password.  Due to Covid, a mask is required upon entering the hospital/clinic. If you do not have a mask, one will be given to you upon arrival. For doctor visits, patients may have 1 support person aged 18 or older with them. For treatment visits, patients cannot have anyone with them due to current Covid guidelines and our immunocompromised population.  

## 2020-07-29 NOTE — Patient Instructions (Signed)
[place] at [facility] Discharge Instructions  You were seen today by Dr. Delton Coombes. He went over your recent results. You received your treatment today. You will be scheduled to have a PET scan done before your next visit. Dr. Delton Coombes will see you back in 3 weeks for labs and follow up.   Thank you for choosing [place] at [facility] to provide your oncology and hematology care.  To afford each patient quality time with our provider, please arrive at least 15 minutes before your scheduled appointment time.   If you have a lab appointment with the [facility] please come in thru the Main Entrance and check in at the main information desk  You need to re-schedule your appointment should you arrive 10 or more minutes late.  We strive to give you quality time with our providers, and arriving late affects you and other patients whose appointments are after yours.  Also, if you no show three or more times for appointments you may be dismissed from the clinic at the providers discretion.     Again, thank you for choosing [facility].  Our hope is that these requests will decrease the amount of time that you wait before being seen by our physicians.       _____________________________________________________________  Should you have questions after your visit to [facility], please contact our office at [phone] between the hours of 8:00 a.m. and 4:30 p.m.  Voicemails left after 4:00 p.m. will not be returned until the following business day.  For prescription refill requests, have your pharmacy contact our office and allow 72 hours.    Cancer Center Support Programs:   > Cancer Support Group  2nd Tuesday of the month 1pm-2pm, Journey Room

## 2020-07-29 NOTE — Progress Notes (Signed)
Treatment given per orders. Patient tolerated it well without problems. Vitals stable and discharged home from clinic ambulatory. Follow up as scheduled.  

## 2020-07-29 NOTE — Progress Notes (Signed)
[name], [place] [address]   CLINIC:  Medical Oncology/Hematology  PCP:  Alexander Evens, MD [place]. 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   07/28/2014 Initial Diagnosis   Oropharyngeal cancer   08/03/2014 Imaging   CT CAP- L supraclavicular lymphadenopathy is not completely visualized. This is better seen on the previous neck CT from 07/27/2014. Otherwise, no evidence for metastatic disease in the chest, abdomen, or pelvis.   08/03/2014 Imaging   Bone scan- Uptake at adjacent anterior LEFT 6, 7, 8 ribs likely representing trauma/fractures. Questionable nonspecific increased tracer localization at the posterior RIGHT 8th and 9th ribs, the adjacent nature which raises a a question of trauma as well   08/06/2014 Pathology Results   Dr. Benjamine [name]- Oropharynx, biopsy, Left - INVASIVE SQUAMOUS CELL CARCINOMA.   08/12/2014 Procedure   Dr. Enrique [name]- 1. Multiple extraction of tooth numbers 6, 17, 22, 23, 24, 25, 26, and 27. 3 Quadrants of alveoloplasty   08/17/2014 Pathology Results   PORT and G-TUBE placed by Dr. Carlis [name].   08/26/2014 PET scan   Large hypermetabolic mass in the left base of tongue. Activity extends across midline to the right base tongue. 2. Intensely hypermetabolic left cervical metastatic lymph nodes. Lymph nodes extend from the left level II position to the left supraclavi   09/01/2014 - 09/22/2014 Chemotherapy   Concurrent chemoradiation with Cisplatin 100 mg/m2 x 2 cycles with Neulasta  support. Held cycle #3 d/t renal toxicity.    09/03/2014 - 10/23/2014 Radiation Therapy   Treated in Alexander [name], IMRT Alexander [name]).  Base of tongue and bilat neck. Total dose: 70 Gy in 35 fractions. (of note, he did miss several treatments requiring BID dosing towards the end of treatment).    01/25/2015 PET scan   Near complete resolution of metabolic activity at the base of tongue. Minimal residual activity is likely post treatment effect. 2. Complete resolution of metabolic activity above LEFT cervical lymph nodes. No evidence of residual metabolically active    2/77/4128 Procedure   Port-a-cath removed Alexander [name])    05/03/2018 - 08/23/2018 Chemotherapy   The patient had dexamethasone (DECADRON) 4 MG tablet, 8 mg, Oral, Daily, 1 of 1 cycle, Start date: 05/01/2018, End date: 10/23/2018 palonosetron (ALOXI) injection 0.25 mg, 0.25 mg, Intravenous,  Once, 6 of 6 cycles Administration: 0.25 mg (05/03/2018), 0.25 mg (05/24/2018), 0.25 mg (06/14/2018), 0.25 mg (07/09/2018), 0.25 mg (07/30/2018), 0.25 mg (08/21/2018) pegfilgrastim-cbqv (UDENYCA) injection 6 mg, 6 mg, Subcutaneous, Once, 5 of 5 cycles Administration: 6 mg (05/27/2018), 6 mg (06/17/2018), 6 mg (07/11/2018), 6 mg (08/01/2018), 6 mg (08/23/2018) CARBOplatin (PARAPLATIN) 380 mg in sodium chloride 0.9 % 250 mL chemo infusion, 380 mg (100 % of original dose 381 mg), Intravenous,  Once, 6 of 6 cycles Dose modification:   (original dose 381 mg, Cycle 1),   (original dose 309.5 mg, Cycle 2), 307.5 mg (original dose 309.5 mg, Cycle 5) Administration: 380 mg (05/03/2018), 310 mg (05/24/2018), 310 mg (06/14/2018), 340 mg (07/09/2018), 310 mg (07/30/2018), 350 mg (08/21/2018) PACLitaxel (TAXOL) 330 mg in sodium chloride 0.9 %  500 mL chemo infusion (> $RemoveBef'80mg'MrqUHFeIlD$ /m2), 175 mg/m2 = 330 mg (100 % of original dose 175 mg/m2), Intravenous,  Once, 6 of 6 cycles Dose modification: 175 mg/m2 (original dose 175 mg/m2, Cycle 1, Reason: Patient Age) Administration: 330 mg (05/03/2018), 330 mg (05/24/2018),  330 mg (06/14/2018), 330 mg (07/09/2018), 330 mg (07/30/2018), 330 mg (08/21/2018)  for chemotherapy treatment.    05/24/2018 -  Chemotherapy      Patient is on Antibody Plan: HEAD/NECK PEMBROLIZUMAB Q21D    Squamous cell lung cancer, left ([place])  06/14/2018 Initial Diagnosis   Squamous cell lung cancer, left ([facility])     CANCER STAGING: Cancer Staging Oropharyngeal carcinoma ([place]) Staging form: Pharynx - Oropharynx, AJCC 7th Edition - Clinical: Stage IVA (T4a, N2b, M0) - Unsigned   INTERVAL HISTORY:  Alexander [name], a 70 y.o. male, returns for routine follow-up and consideration for next cycle of immunotherapy. Alexander [name] was last seen on 07/08/2020.  Due for cycle #38 of Keytruda today.   Overall, he tells me he has been feeling pretty well. He is eating and drinking well and denies having any N/V/D. His appetite is excellent and he denies starting any new meds or any changes in his overall health. He denies having a history of prostate cancer and reports having an interrupted stream and waking up about twice per night to urinate; he is taking Flomax QHS and it has improved his flow.  Overall, he feels ready for next cycle of immunotherapy today.    REVIEW OF SYSTEMS:  Review of Systems  Constitutional: Positive for fatigue (75%). Negative for appetite change.  Gastrointestinal: Negative for diarrhea, nausea and vomiting.  Genitourinary: Positive for difficulty urinating (interrupted stream) and nocturia.   All other systems reviewed and are negative.   PAST MEDICAL/SURGICAL HISTORY:  Past Medical History:  Diagnosis Date  . GERD (gastroesophageal reflux disease)   . 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 Oropharyngeal cancer ([place]) 07/28/2014   dx. 3 weeks ago.- [name] [place], [name].  Marland Kitchen 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[place]) 08/06/2014   SCCa of Left BOT   Past Surgical History:  Procedure Laterality  Date  . BIOPSY  01/15/2018   Procedure: BIOPSY;  Surgeon: Alexander Binder, MD;  [facility];  Service: Endoscopy;;  gastric  . COLONOSCOPY N/A 03/13/2016   Procedure: COLONOSCOPY;  Surgeon: Alexander Binder, MD;  [facility];  Service: Endoscopy;  Laterality: N/A;  2:15 PM  . ESOPHAGOGASTRODUODENOSCOPY (EGD) WITH PROPOFOL N/A 08/17/2014   Procedure: ESOPHAGOGASTRODUODENOSCOPY (EGD) WITH PROPOFOL (procedure #1);  Surgeon: Aviva Signs Md, MD;  Location: [facility];  Service: General;  Laterality: N/A;  . ESOPHAGOGASTRODUODENOSCOPY (EGD) WITH PROPOFOL N/A 01/15/2018   Procedure: ESOPHAGOGASTRODUODENOSCOPY (EGD) WITH PROPOFOL;  Surgeon: Alexander Binder, MD;  [facility];  Service: Endoscopy;  Laterality: N/A;  9:30am  . MULTIPLE EXTRACTIONS WITH ALVEOLOPLASTY N/A 08/12/2014   Procedure: Extraction of tooth #'s 6,17,22,23,24,25,26,27 with alveoloplasty;  Surgeon: Lenn Cal, DDS;  Location: [facility];  Service: Oral Surgery;  Laterality: N/A;  . PANENDOSCOPY N/A 08/06/2014   Procedure: PANENDOSCOPY WITH BIOPSY;  Surgeon: Leta Baptist, MD;  Location: [facility];  Service: ENT;  Laterality: N/A;  . PEG PLACEMENT Left 08/17/14  . PEG PLACEMENT N/A 08/17/2014   Procedure: PERCUTANEOUS ENDOSCOPIC GASTROSTOMY (PEG) PLACEMENT (procedure #1);  Surgeon: Aviva Signs Md, MD;  Location: [facility];  Service: General;  Laterality: N/A;  . PORT-A-CATH REMOVAL Right 07/17/2016   Procedure: MINOR REMOVAL PORT-A-CATH;  Surgeon: Aviva Signs, MD;  Location: [facility];  Service: General;  Laterality: Right;  . PORTACATH PLACEMENT Right 08/17/14  . PORTACATH PLACEMENT Right 08/17/2014   Procedure: INSERTION PORT-A-CATH (procedure #2);  Surgeon: Aviva Signs Md, MD;  Location: [facility];  Service: General;  Laterality: Right;  . PORTACATH PLACEMENT Left 04/26/2018   Procedure: INSERTION PORT-A-CATH (attached catheter in left subclavian);  Surgeon: Aviva Signs, MD;  Location: [facility];   Service: General;  Laterality: Left;  . SAVORY DILATION N/A 01/15/2018   Procedure: SAVORY DILATION;  Surgeon: Alexander Binder, MD;  [facility];  Service: Endoscopy;  Laterality: N/A;  . VIDEO BRONCHOSCOPY WITH ENDOBRONCHIAL ULTRASOUND N/A 04/15/2018   Procedure: VIDEO BRONCHOSCOPY WITH ENDOBRONCHIAL ULTRASOUND;  Surgeon: Melrose Nakayama, MD;  Location: [facility];  Service: Thoracic;  Laterality: N/A;    SOCIAL HISTORY:  Social History   Socioeconomic History  . Marital status: Legally Separated    Spouse name: Not on file  . Number of children: 5  . Years of education: Not on file  . Highest education level: Not on file  Occupational History  . Not on file  Tobacco Use  . Smoking status: Former Smoker    Packs/day: 0.50    Years: 30.00    Pack years: 15.00    Quit date: 07/22/2014    Years since quitting: 6.0  . Smokeless tobacco: Never Used  Substance and Sexual Activity  . Alcohol use: Not Currently    Alcohol/week: 0.0 standard drinks    Comment: None currently (11/09/17); previously 1-2 beers on the weekend  . Drug use: No  . Sexual activity: Not on file  Other Topics Concern  . Not on file  Social History Narrative  . Not on file   Social Determinants of Health   Financial Resource Strain: Low Risk   . Difficulty of Paying Living Expenses: Not hard at all  Food Insecurity: No Food Insecurity  . Worried About Charity fundraiser in the Last Year: Never true  . Ran Out of Food in the Last Year: Never true  Transportation Needs: No Transportation Needs  . Lack of Transportation (Medical): No  . Lack of Transportation (Non-Medical): No  Physical Activity: Inactive  . Days of Exercise per Week: 0 days  . Minutes of Exercise per Session: 0 min  Stress: No Stress Concern Present  . Feeling of Stress : Not at all  Social Connections: Moderately Isolated  . Frequency of Communication with Friends and Family: More than three times a week  . Frequency of  Social Gatherings with Friends and Family: Twice a week  . Attends Religious Services: Never  . Active Member of Clubs or Organizations: No  . Attends Archivist Meetings: Never  . Marital Status: Married  Human resources officer Violence: Not At Risk  . Fear of Current or Ex-Partner: No  . Emotionally Abused: No  . Physically Abused: No  . Sexually Abused: No    FAMILY HISTORY:  Family History  Problem Relation Age of Onset  . Colon cancer Neg Hx   . Gastric cancer Neg Hx   . Esophageal cancer Neg Hx     CURRENT MEDICATIONS:  Current Outpatient Medications  Medication Sig Dispense Refill  . feeding supplement, ENSURE ENLIVE, (ENSURE ENLIVE) LIQD Take 237 mLs by mouth 4 (four) times daily.     Marland Kitchen HYDROcodone-acetaminophen (NORCO/VICODIN) 5-325 MG  tablet TAKE 1 TABLET EVERY 12 HOURS AS NEEDED FOR MODERATE PAIN 60 tablet 0  . levothyroxine (SYNTHROID) 88 MCG tablet Take 1 tablet (88 mcg total) by mouth daily before breakfast. 30 tablet 3  . Melatonin 10 MG TABS Take 1 tablet by mouth at bedtime.    . naproxen sodium (ALEVE) 220 MG tablet Take 220 mg by mouth daily as needed.     Marland Kitchen omeprazole (PRILOSEC) 20 MG capsule TAKE 1 CAPSULE BY MOUTH 30 MINUTES PRIOR TO BREAKFAST 90 capsule 0  . Pembrolizumab (KEYTRUDA IV) Inject into the vein every 21 ( twenty-one) days.    . tamsulosin (FLOMAX) 0.4 MG CAPS capsule Take 1 capsule (0.4 mg total) by mouth at bedtime. 30 capsule 2   Current Facility-Administered Medications  Medication Dose Route Frequency Provider Last Rate Last Admin  . 0.9 %  sodium chloride infusion   Intravenous Once Derek Jack, MD       Facility-Administered Medications Ordered in Other Visits  Medication Dose Route Frequency Provider Last Rate Last Admin  . sodium chloride flush (NS) 0.9 % injection 10 mL  10 mL Intracatheter PRN Derek Jack, MD   10 mL at 06/24/19 0925    ALLERGIES:  No Known Allergies  PHYSICAL EXAM:  Performance status  (ECOG): 1 - Symptomatic but completely ambulatory  Vitals:   07/29/20 1251  BP: (!) 91/54  Pulse: 65  Resp: 18  Temp: (!) 97.3 F (36.3 C)  SpO2: 97%   Wt Readings from Last 3 Encounters:  07/29/20 136 lb 9.6 oz (62 kg)  07/08/20 135 lb 6.4 oz (61.4 kg)  06/17/20 134 lb 4 oz (60.9 kg)   Physical Exam Vitals reviewed.  Constitutional:      Appearance: Normal appearance.  Cardiovascular:     Rate and Rhythm: Normal rate and regular rhythm.     Pulses: Normal pulses.     Heart sounds: Normal heart sounds.  Pulmonary:     Effort: Pulmonary effort is normal.     Breath sounds: Normal breath sounds.  Chest:     Comments: Port-a-Cath in L chest Neurological:     General: No focal deficit present.     Mental Status: He is alert and oriented to person, place, and time.  Psychiatric:        Mood and Affect: Mood normal.        Behavior: Behavior normal.     LABORATORY DATA:  I have reviewed the labs as listed.  CBC Latest Ref Rng & Units 07/29/2020 07/08/2020 06/17/2020  WBC 4.0 - 10.5 K/uL 4.8 5.2 4.0  Hemoglobin 13.0 - 17.0 g/dL 12.7(L) 12.4(L) 13.0  Hematocrit 39.0 - 52.0 % 38.7(L) 38.9(L) 40.3  Platelets 150 - 400 K/uL 172 192 199   CMP Latest Ref Rng & Units 07/29/2020 07/08/2020 06/17/2020  Glucose 70 - 99 mg/dL 115(H) 112(H) 92  BUN 8 - 23 mg/dL 35(H) 39(H) 25(H)  Creatinine 0.61 - 1.24 mg/dL 1.72(H) 1.77(H) 1.60(H)  Sodium 135 - 145 mmol/L 140 138 138  Potassium 3.5 - 5.1 mmol/L 4.3 4.3 4.3  Chloride 98 - 111 mmol/L 106 104 104  CO2 22 - 32 mmol/L $RemoveB'26 24 25  'IhALMvYa$ Calcium 8.9 - 10.3 mg/dL 9.0 8.9 8.6(L)  Total Protein 6.5 - 8.1 g/dL 6.4(L) 6.7 6.5  Total Bilirubin 0.3 - 1.2 mg/dL 0.5 0.4 0.7  Alkaline Phos 38 - 126 U/L 55 52 49  AST 15 - 41 U/L 58(H) 22 23  ALT 0 - 44 U/L  69(H) 19 20   PSA 55.81 (H) 06/17/2020  PSA 28.62 (H) 01/22/2019  PSA 30.55 (H) 12/25/2018    DIAGNOSTIC IMAGING:  I have independently reviewed the scans and discussed with the patient. NM Bone  Scan Whole Body  Result Date: 07/21/2020 CLINICAL DATA:  Non-small cell lung cancer.  Oropharyngeal cancer. EXAM: NUCLEAR MEDICINE WHOLE BODY BONE SCAN TECHNIQUE: Whole body anterior and posterior images were obtained approximately 3 hours after intravenous injection of radiopharmaceutical. RADIOPHARMACEUTICALS:  20.5 mCi Technetium-10m MDP IV COMPARISON:  PET-CT 03/15/2020, bone scan 08/03/2014 FINDINGS: Physiologic distribution of radiotracer with bilateral renal uptake and excretion. Focal uptake within the lumbar region, to the left of midline approximately L4-5 complete which is favored secondary to lumbar spondylosis at this level. Degenerative uptake at the bilateral knees. Previously seen bilateral rib uptake has resolved. IMPRESSION: 1. Focal uptake in lumbar spine at the L4-5 level, left of midline, favored to represent degenerative spondylosis as degenerative endplate changes are present at this location on previous CT. 2. Otherwise, no abnormal radiotracer accumulation within the axial or appendicular skeleton. Electronically Signed   By: Davina Poke D.O.   On: 07/21/2020 14:33     ASSESSMENT:  1. 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 -6 cycles of carboplatin, paclitaxel and pembrolizumab from 05/03/2018 through 08/21/2018. -Maintenance pembrolizumab started on 09/11/2018. -PET scan on 09/15/2019 showed interval decrease in hypermetabolic areas associated with tongue and floor of the mouth. Hypermetabolic metastatic lymphadenopathy in the chest is [name]. No new sites seen. -PET scan on 03/15/2020 shows persistent, [name] hypermetabolism in the tongue/floor of mouth. Slight interval decrease in hypermetabolism with mediastinal/hilar adenopathy. Persistent hypermetabolic focus in the right supraclavicular region. New focus of hypermetabolic them identified in the right external iliac chain of pelvis with  no discernible adenopathy on the CT.  2. 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   PLAN:  1. 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 - Reviewed his labs which showed mildly elevated AST and ALT of 58 and 69 respectively.  CBC was normal. - We will proceed with Keytruda today. - His blood pressure is 91/54.  We will check cortisol level.  He will receive final mL of normal saline bolus.  RTC 3 weeks for follow-up.  2. Hypothyroidism: -TSH today is 3.9.  Continue Synthroid 88 mcg daily.  3. 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  4. Bilateral knee pains: -Continue hydrocodone twice daily as needed.  5. Nutrition: -Continue 2 to 4 cans of boost per day.  Weight is [name].  6. CKD: -Creatinine today is 1.72.  Baseline creatinine between 1.5-1.8.  7. Elevated PSA: -Baseline PSA around 13 in 2020.  PSA found to be elevated at 51.8 on 05/17/2020 in [place]. - He is continuing to use Flomax with improvement in urinary stream. - PSA repeated on 06/17/2020 was elevated at 55.81. - We reviewed bone scan from 07/20/2020 which did not reveal any metastatic disease. - I have recommended PSMA PET CT scan for further evaluation as traditional imaging negative.   Orders placed this encounter:  Orders Placed This Encounter  Procedures  . NM PET ([facility]) SKULL TO MID THIGH  . CBC with Differential/Platelet  . Comprehensive metabolic  panel  . TSH  . Cortisol  . PSA     Derek Jack, MD [place] [phone]   I, Milinda Antis, am acting as a scribe for Dr. Sanda Linger.  I, Derek Jack MD, have reviewed the above documentation for accuracy and  completeness, and I agree with the above.

## 2020-08-02 ENCOUNTER — Other Ambulatory Visit (HOSPITAL_COMMUNITY): Payer: Self-pay | Admitting: Hematology

## 2020-08-19 ENCOUNTER — Ambulatory Visit (HOSPITAL_COMMUNITY): Payer: Medicare Other

## 2020-08-19 ENCOUNTER — Inpatient Hospital Stay (HOSPITAL_COMMUNITY): Payer: Medicare Other | Admitting: Hematology

## 2020-08-19 ENCOUNTER — Inpatient Hospital Stay (HOSPITAL_COMMUNITY): Payer: Medicare Other

## 2020-08-19 DIAGNOSIS — C3492 Malignant neoplasm of unspecified part of left bronchus or lung: Secondary | ICD-10-CM

## 2020-08-19 DIAGNOSIS — R972 Elevated prostate specific antigen [PSA]: Secondary | ICD-10-CM

## 2020-08-25 ENCOUNTER — Encounter (HOSPITAL_COMMUNITY): Payer: Self-pay

## 2020-08-25 ENCOUNTER — Inpatient Hospital Stay (HOSPITAL_COMMUNITY): Payer: Medicare Other

## 2020-08-25 ENCOUNTER — Other Ambulatory Visit: Payer: Self-pay

## 2020-08-25 ENCOUNTER — Inpatient Hospital Stay (HOSPITAL_COMMUNITY): Payer: Medicare Other | Attending: Hematology

## 2020-08-25 VITALS — BP 107/63 | HR 56 | Temp 97.8°F | Resp 18 | Ht 72.0 in | Wt 135.0 lb

## 2020-08-25 DIAGNOSIS — Z5112 Encounter for antineoplastic immunotherapy: Secondary | ICD-10-CM | POA: Diagnosis present

## 2020-08-25 DIAGNOSIS — Z79899 Other long term (current) drug therapy: Secondary | ICD-10-CM | POA: Diagnosis not present

## 2020-08-25 DIAGNOSIS — Z9221 Personal history of antineoplastic chemotherapy: Secondary | ICD-10-CM | POA: Insufficient documentation

## 2020-08-25 DIAGNOSIS — Z87891 Personal history of nicotine dependence: Secondary | ICD-10-CM | POA: Insufficient documentation

## 2020-08-25 DIAGNOSIS — C109 Malignant neoplasm of oropharynx, unspecified: Secondary | ICD-10-CM

## 2020-08-25 DIAGNOSIS — N289 Disorder of kidney and ureter, unspecified: Secondary | ICD-10-CM | POA: Insufficient documentation

## 2020-08-25 DIAGNOSIS — E039 Hypothyroidism, unspecified: Secondary | ICD-10-CM | POA: Diagnosis not present

## 2020-08-25 DIAGNOSIS — C3492 Malignant neoplasm of unspecified part of left bronchus or lung: Secondary | ICD-10-CM | POA: Insufficient documentation

## 2020-08-25 DIAGNOSIS — Z85810 Personal history of malignant neoplasm of tongue: Secondary | ICD-10-CM | POA: Diagnosis not present

## 2020-08-25 DIAGNOSIS — Z923 Personal history of irradiation: Secondary | ICD-10-CM | POA: Diagnosis not present

## 2020-08-25 DIAGNOSIS — R972 Elevated prostate specific antigen [PSA]: Secondary | ICD-10-CM | POA: Insufficient documentation

## 2020-08-25 LAB — COMPREHENSIVE METABOLIC PANEL
ALT: 18 U/L (ref 0–44)
AST: 25 U/L (ref 15–41)
Albumin: 3.9 g/dL (ref 3.5–5.0)
Alkaline Phosphatase: 54 U/L (ref 38–126)
Anion gap: 9 (ref 5–15)
BUN: 47 mg/dL — ABNORMAL HIGH (ref 8–23)
CO2: 26 mmol/L (ref 22–32)
Calcium: 9.4 mg/dL (ref 8.9–10.3)
Chloride: 102 mmol/L (ref 98–111)
Creatinine, Ser: 2.02 mg/dL — ABNORMAL HIGH (ref 0.61–1.24)
GFR, Estimated: 35 mL/min — ABNORMAL LOW (ref >60)
Glucose, Bld: 92 mg/dL (ref 70–99)
Potassium: 4.6 mmol/L (ref 3.5–5.1)
Sodium: 137 mmol/L (ref 135–145)
Total Bilirubin: 0.6 mg/dL (ref 0.3–1.2)
Total Protein: 6.9 g/dL (ref 6.5–8.1)

## 2020-08-25 LAB — CBC WITH DIFFERENTIAL/PLATELET
Abs Immature Granulocytes: 0.01 10*3/uL (ref 0.00–0.07)
Basophils Absolute: 0 10*3/uL (ref 0.0–0.1)
Basophils Relative: 0 %
Eosinophils Absolute: 0.2 10*3/uL (ref 0.0–0.5)
Eosinophils Relative: 3 %
HCT: 39.3 % (ref 39.0–52.0)
Hemoglobin: 12.8 g/dL — ABNORMAL LOW (ref 13.0–17.0)
Immature Granulocytes: 0 %
Lymphocytes Relative: 22 %
Lymphs Abs: 1.2 10*3/uL (ref 0.7–4.0)
MCH: 30.9 pg (ref 26.0–34.0)
MCHC: 32.6 g/dL (ref 30.0–36.0)
MCV: 94.9 fL (ref 80.0–100.0)
Monocytes Absolute: 0.6 10*3/uL (ref 0.1–1.0)
Monocytes Relative: 11 %
Neutro Abs: 3.3 10*3/uL (ref 1.7–7.7)
Neutrophils Relative %: 64 %
Platelets: 173 10*3/uL (ref 150–400)
RBC: 4.14 MIL/uL — ABNORMAL LOW (ref 4.22–5.81)
RDW: 13.6 % (ref 11.5–15.5)
WBC: 5.2 10*3/uL (ref 4.0–10.5)
nRBC: 0 % (ref 0.0–0.2)

## 2020-08-25 LAB — TSH: TSH: 2.379 u[IU]/mL (ref 0.350–4.500)

## 2020-08-25 MED ORDER — HEPARIN SOD (PORK) LOCK FLUSH 100 UNIT/ML IV SOLN
500.0000 [IU] | Freq: Once | INTRAVENOUS | Status: AC | PRN
  Administered 2020-08-25: 500 [IU]

## 2020-08-25 MED ORDER — SODIUM CHLORIDE 0.9 % IV SOLN: Freq: Once | INTRAVENOUS | Status: AC

## 2020-08-25 MED ORDER — SODIUM CHLORIDE 0.9 % IV SOLN
200.0000 mg | Freq: Once | INTRAVENOUS | Status: AC
  Administered 2020-08-25: 200 mg via INTRAVENOUS
  Filled 2020-08-25: qty 8

## 2020-08-25 MED ORDER — SODIUM CHLORIDE 0.9% FLUSH
10.0000 mL | INTRAVENOUS | Status: DC | PRN
  Administered 2020-08-25: 10 mL

## 2020-08-25 NOTE — Patient Instructions (Signed)
[name]  Discharge Instructions: Thank you for choosing [facility] to provide your oncology and hematology care.  If you have a lab appointment with the [facility], please come in thru the Main Entrance and check in at the main information desk.  Wear comfortable clothing and clothing appropriate for easy access to any Portacath or PICC line.   We strive to give you quality time with your provider. You may need to reschedule your appointment if you arrive late (15 or more minutes).  Arriving late affects you and other patients whose appointments are after yours.  Also, if you miss three or more appointments without notifying the office, you may be dismissed from the clinic at the provider's discretion.      For prescription refill requests, have your pharmacy contact our office and allow 72 hours for refills to be completed.    Today you received the following chemotherapy and/or immunotherapy agents Keytruda.    To help prevent nausea and vomiting after your treatment, we encourage you to take your nausea medication as directed.  BELOW ARE SYMPTOMS THAT SHOULD BE REPORTED IMMEDIATELY: . *FEVER GREATER THAN 100.4 F (38 C) OR HIGHER . *CHILLS OR SWEATING . *NAUSEA AND VOMITING THAT IS NOT CONTROLLED WITH YOUR NAUSEA MEDICATION . *UNUSUAL SHORTNESS OF BREATH . *UNUSUAL BRUISING OR BLEEDING . *URINARY PROBLEMS (pain or burning when urinating, or frequent urination) . *BOWEL PROBLEMS (unusual diarrhea, constipation, pain near the anus) . TENDERNESS IN MOUTH AND THROAT WITH OR WITHOUT PRESENCE OF ULCERS (sore throat, sores in mouth, or a toothache) . UNUSUAL RASH, SWELLING OR PAIN  . UNUSUAL VAGINAL DISCHARGE OR ITCHING   Items with * indicate a potential emergency and should be followed up as soon as possible or go to the Emergency Department if any problems should occur.  Please show the CHEMOTHERAPY ALERT CARD or IMMUNOTHERAPY ALERT CARD at check-in to  the Emergency Department and triage nurse.  Should you have questions after your visit or need to cancel or reschedule your appointment, please contact [facility] [phone]  and follow the prompts.  Office hours are 8:00 a.m. to 4:30 p.m. Monday - Friday. Please note that voicemails left after 4:00 p.m. may not be returned until the following business day.  We are closed weekends and major holidays. You have access to a nurse at all times for urgent questions. Please call the main number to the clinic [phone] and follow the prompts.  For any non-urgent questions, you may also contact your provider using MyChart. We now offer e-Visits for anyone 58 and older to request care online for non-urgent symptoms. For details visit [email or web address].   Also download the MyChart app! Go to the app store, search "MyChart", open the app, select [facility], and log in with your MyChart username and password.  Due to Covid, a mask is required upon entering the hospital/clinic. If you do not have a mask, one will be given to you upon arrival. For doctor visits, patients may have 1 support person aged 23 or older with them. For treatment visits, patients cannot have anyone with them due to current Covid guidelines and our immunocompromised population.

## 2020-08-25 NOTE — Progress Notes (Signed)
Patient presents today for treatment. Keytruda. Labs pending. Blood pressure on arrival low.84/57. Patient asymptomatic. Patient runs 90/50's per patient's words. MAR reviewed.   Labs reviewed by Dr. Delton Coombes. Creatinine today 2.02 and MD aware. Verbal order received to give 558ml Normal Saline bolus over 30 minutes with treatment.   Treatment given today per MD orders. Tolerated infusion without adverse affects. Vital signs stable. No complaints at this time. Discharged from clinic ambulatory in stable condition. Alert and oriented x 3. F/U with [facility] as scheduled.

## 2020-08-27 ENCOUNTER — Ambulatory Visit (HOSPITAL_COMMUNITY)
Admission: RE | Admit: 2020-08-27 | Discharge: 2020-08-27 | Disposition: A | Discharge status: Home / Self Care | Payer: Medicare Other | Source: Ambulatory Visit | Attending: Hematology | Admitting: Hematology

## 2020-08-27 ENCOUNTER — Other Ambulatory Visit: Payer: Self-pay

## 2020-08-27 DIAGNOSIS — C109 Malignant neoplasm of oropharynx, unspecified: Secondary | ICD-10-CM | POA: Insufficient documentation

## 2020-08-27 DIAGNOSIS — C3492 Malignant neoplasm of unspecified part of left bronchus or lung: Secondary | ICD-10-CM | POA: Insufficient documentation

## 2020-08-27 MED ORDER — IOHEXOL 300 MG/ML  SOLN
75.0000 mL | Freq: Once | INTRAMUSCULAR | Status: AC | PRN
  Administered 2020-08-27: 75 mL via INTRAVENOUS

## 2020-08-27 MED ORDER — IOHEXOL 9 MG/ML PO SOLN
ORAL | Status: AC
  Filled 2020-08-27: qty 1000

## 2020-08-30 ENCOUNTER — Other Ambulatory Visit (HOSPITAL_COMMUNITY): Payer: Self-pay | Admitting: Hematology

## 2020-08-30 DIAGNOSIS — C3492 Malignant neoplasm of unspecified part of left bronchus or lung: Secondary | ICD-10-CM

## 2020-08-31 ENCOUNTER — Other Ambulatory Visit (HOSPITAL_COMMUNITY): Payer: Self-pay | Admitting: Surgery

## 2020-08-31 ENCOUNTER — Encounter (HOSPITAL_COMMUNITY): Payer: Self-pay | Admitting: Surgery

## 2020-08-31 ENCOUNTER — Ambulatory Visit (HOSPITAL_COMMUNITY): Payer: Medicare Other

## 2020-08-31 DIAGNOSIS — C3492 Malignant neoplasm of unspecified part of left bronchus or lung: Secondary | ICD-10-CM

## 2020-08-31 MED ORDER — HYDROCODONE-ACETAMINOPHEN 5-325 MG PO TABS: ORAL_TABLET | ORAL | 0 refills | Status: DC

## 2020-09-02 ENCOUNTER — Other Ambulatory Visit (HOSPITAL_COMMUNITY): Payer: Self-pay | Admitting: *Deleted

## 2020-09-02 ENCOUNTER — Encounter (HOSPITAL_COMMUNITY): Payer: Self-pay | Admitting: Internal Medicine

## 2020-09-02 DIAGNOSIS — C3492 Malignant neoplasm of unspecified part of left bronchus or lung: Secondary | ICD-10-CM

## 2020-09-02 MED ORDER — HYDROCODONE-ACETAMINOPHEN 5-325 MG PO TABS: ORAL_TABLET | ORAL | 0 refills | Status: DC

## 2020-09-09 ENCOUNTER — Other Ambulatory Visit (HOSPITAL_COMMUNITY): Payer: Medicare Other

## 2020-09-09 ENCOUNTER — Ambulatory Visit (HOSPITAL_COMMUNITY): Payer: Medicare Other

## 2020-09-09 ENCOUNTER — Ambulatory Visit (HOSPITAL_COMMUNITY): Payer: Medicare Other | Admitting: Hematology

## 2020-09-16 ENCOUNTER — Inpatient Hospital Stay (HOSPITAL_BASED_OUTPATIENT_CLINIC_OR_DEPARTMENT_OTHER): Payer: Medicare Other | Admitting: Hematology

## 2020-09-16 ENCOUNTER — Encounter (HOSPITAL_COMMUNITY): Payer: Self-pay | Admitting: Hematology

## 2020-09-16 ENCOUNTER — Other Ambulatory Visit: Payer: Self-pay

## 2020-09-16 ENCOUNTER — Other Ambulatory Visit (HOSPITAL_COMMUNITY): Payer: Self-pay

## 2020-09-16 ENCOUNTER — Inpatient Hospital Stay (HOSPITAL_COMMUNITY): Payer: Medicare Other

## 2020-09-16 VITALS — BP 124/62 | HR 60 | Temp 96.6°F | Resp 18

## 2020-09-16 VITALS — Wt 134.8 lb

## 2020-09-16 DIAGNOSIS — C3492 Malignant neoplasm of unspecified part of left bronchus or lung: Secondary | ICD-10-CM

## 2020-09-16 DIAGNOSIS — R972 Elevated prostate specific antigen [PSA]: Secondary | ICD-10-CM

## 2020-09-16 DIAGNOSIS — E039 Hypothyroidism, unspecified: Secondary | ICD-10-CM

## 2020-09-16 DIAGNOSIS — C109 Malignant neoplasm of oropharynx, unspecified: Secondary | ICD-10-CM | POA: Diagnosis not present

## 2020-09-16 DIAGNOSIS — Z5112 Encounter for antineoplastic immunotherapy: Secondary | ICD-10-CM | POA: Diagnosis not present

## 2020-09-16 LAB — CBC WITH DIFFERENTIAL/PLATELET
Abs Immature Granulocytes: 0.01 10*3/uL (ref 0.00–0.07)
Basophils Absolute: 0 10*3/uL (ref 0.0–0.1)
Basophils Relative: 0 %
Eosinophils Absolute: 0.1 10*3/uL (ref 0.0–0.5)
Eosinophils Relative: 3 %
HCT: 39.6 % (ref 39.0–52.0)
Hemoglobin: 12.8 g/dL — ABNORMAL LOW (ref 13.0–17.0)
Immature Granulocytes: 0 %
Lymphocytes Relative: 24 %
Lymphs Abs: 0.9 10*3/uL (ref 0.7–4.0)
MCH: 30.5 pg (ref 26.0–34.0)
MCHC: 32.3 g/dL (ref 30.0–36.0)
MCV: 94.3 fL (ref 80.0–100.0)
Monocytes Absolute: 0.4 10*3/uL (ref 0.1–1.0)
Monocytes Relative: 12 %
Neutro Abs: 2.3 10*3/uL (ref 1.7–7.7)
Neutrophils Relative %: 61 %
Platelets: 190 10*3/uL (ref 150–400)
RBC: 4.2 MIL/uL — ABNORMAL LOW (ref 4.22–5.81)
RDW: 14.3 % (ref 11.5–15.5)
WBC: 3.7 10*3/uL — ABNORMAL LOW (ref 4.0–10.5)
nRBC: 0 % (ref 0.0–0.2)

## 2020-09-16 LAB — COMPREHENSIVE METABOLIC PANEL
ALT: 21 U/L (ref 0–44)
AST: 22 U/L (ref 15–41)
Albumin: 3.6 g/dL (ref 3.5–5.0)
Alkaline Phosphatase: 51 U/L (ref 38–126)
Anion gap: 7 (ref 5–15)
BUN: 21 mg/dL (ref 8–23)
CO2: 27 mmol/L (ref 22–32)
Calcium: 9 mg/dL (ref 8.9–10.3)
Chloride: 105 mmol/L (ref 98–111)
Creatinine, Ser: 1.75 mg/dL — ABNORMAL HIGH (ref 0.61–1.24)
GFR, Estimated: 41 mL/min — ABNORMAL LOW (ref >60)
Glucose, Bld: 99 mg/dL (ref 70–99)
Potassium: 4.6 mmol/L (ref 3.5–5.1)
Sodium: 139 mmol/L (ref 135–145)
Total Bilirubin: 0.4 mg/dL (ref 0.3–1.2)
Total Protein: 6.4 g/dL — ABNORMAL LOW (ref 6.5–8.1)

## 2020-09-16 LAB — TSH: TSH: 2.638 u[IU]/mL (ref 0.350–4.500)

## 2020-09-16 LAB — PSA: Prostatic Specific Antigen: 53.77 ng/mL — ABNORMAL HIGH (ref 0.00–4.00)

## 2020-09-16 LAB — CORTISOL: Cortisol, Plasma: 15.7 ug/dL

## 2020-09-16 MED ORDER — SODIUM CHLORIDE 0.9 % IV SOLN
Freq: Once | INTRAVENOUS | Status: AC
Start: 2020-09-16 — End: 2020-09-16

## 2020-09-16 MED ORDER — SODIUM CHLORIDE 0.9 % IV SOLN
200.0000 mg | Freq: Once | INTRAVENOUS | Status: AC
  Administered 2020-09-16: 200 mg via INTRAVENOUS
  Filled 2020-09-16: qty 8

## 2020-09-16 MED ORDER — LEVOTHYROXINE SODIUM 88 MCG PO TABS: 88.0000 ug | ORAL_TABLET | Freq: Every day | ORAL | 3 refills | Status: DC

## 2020-09-16 MED ORDER — SODIUM CHLORIDE 0.9% FLUSH
10.0000 mL | INTRAVENOUS | Status: DC | PRN
  Administered 2020-09-16: 10 mL

## 2020-09-16 MED ORDER — HEPARIN SOD (PORK) LOCK FLUSH 100 UNIT/ML IV SOLN
500.0000 [IU] | Freq: Once | INTRAVENOUS | Status: AC | PRN
  Administered 2020-09-16: 500 [IU]

## 2020-09-16 NOTE — Progress Notes (Signed)
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    07/28/2014 Initial Diagnosis   Oropharyngeal cancer    08/03/2014 Imaging   CT CAP- L supraclavicular lymphadenopathy is not completely visualized. This is better seen on the previous neck CT from 07/27/2014. Otherwise, no evidence for metastatic disease in the chest, abdomen, or pelvis.    08/03/2014 Imaging   Bone scan- Uptake at adjacent anterior LEFT 6, 7, 8 ribs likely representing trauma/fractures. Questionable nonspecific increased tracer localization at the posterior RIGHT 8th and 9th ribs, the adjacent nature which raises a a question of trauma as well    08/06/2014 Pathology Results   Dr. Benjamine Mola- Oropharynx, biopsy, Left - INVASIVE SQUAMOUS CELL CARCINOMA.    08/12/2014 Procedure   Dr. Enrique Sack- 1. Multiple extraction of tooth numbers 6, 17, 22, 23, 24, 25, 26, and 27. 3 Quadrants of alveoloplasty    08/17/2014 Pathology Results   PORT and G-TUBE placed by Dr. Carlis Stable.    08/26/2014 PET scan   Large hypermetabolic mass in the left base of tongue. Activity extends across midline to the right base tongue. 2. Intensely hypermetabolic left cervical metastatic lymph nodes. Lymph nodes extend from the left level II position  to the left supraclavi    09/01/2014 - 09/22/2014 Chemotherapy   Concurrent chemoradiation with Cisplatin 100 mg/m2 x 2 cycles with Neulasta support. Held cycle #3 d/t renal toxicity.     09/03/2014 - 10/23/2014 Radiation Therapy   Treated in [place], IMRT Isidore Moos).  Base of tongue and bilat neck. Total dose: 70 Gy in 35 fractions. (of note, he did miss several treatments requiring BID dosing towards the end of treatment).     01/25/2015 PET scan   Near complete resolution of metabolic activity at the base of tongue. Minimal residual activity is likely post treatment effect. 2. Complete resolution of metabolic activity above LEFT cervical lymph nodes. No evidence of residual metabolically active     07/10/6061 Procedure   Port-a-cath removed [name])     05/03/2018 - 08/23/2018 Chemotherapy   The patient had dexamethasone (DECADRON) 4 MG tablet, 8 mg, Oral, Daily, 1 of 1 cycle, Start date: 05/01/2018, End date: 10/23/2018 palonosetron (ALOXI) injection 0.25 mg, 0.25 mg, Intravenous,  Once, 6 of 6 cycles Administration: 0.25 mg (05/03/2018), 0.25 mg (05/24/2018), 0.25 mg (06/14/2018), 0.25 mg (07/09/2018), 0.25 mg (07/30/2018), 0.25 mg (08/21/2018) pegfilgrastim-cbqv (UDENYCA) injection 6 mg, 6 mg, Subcutaneous, Once, 5 of 5 cycles Administration: 6 mg (05/27/2018), 6 mg (06/17/2018), 6 mg (07/11/2018), 6 mg (08/01/2018), 6 mg (08/23/2018) CARBOplatin (PARAPLATIN) 380 mg in sodium chloride 0.9 % 250 mL chemo infusion, 380 mg (100 % of original dose 381 mg), Intravenous,  Once, 6 of 6 cycles Dose modification:   (original dose 381 mg, Cycle 1),   (original dose 309.5 mg, Cycle 2), 307.5 mg (original dose 309.5 mg, Cycle 5) Administration: 380 mg (05/03/2018),  310 mg (05/24/2018), 310 mg (06/14/2018), 340 mg (07/09/2018), 310 mg (07/30/2018), 350 mg (08/21/2018) PACLitaxel (TAXOL) 330 mg in sodium chloride 0.9 % 500 mL chemo infusion (> 58m/m2), 175 mg/m2 = 330 mg (100 % of original dose 175 mg/m2), Intravenous,  Once, 6 of 6  cycles Dose modification: 175 mg/m2 (original dose 175 mg/m2, Cycle 1, Reason: Patient Age) Administration: 330 mg (05/03/2018), 330 mg (05/24/2018), 330 mg (06/14/2018), 330 mg (07/09/2018), 330 mg (07/30/2018), 330 mg (08/21/2018)   for chemotherapy treatment.     05/24/2018 -  Chemotherapy      Patient is on Antibody Plan: HEAD/NECK PEMBROLIZUMAB Q21D     Squamous cell lung cancer, left ([facility]  06/14/2018 Initial Diagnosis   Squamous cell lung cancer, left ([facility])       CURRENT THERAPY:  Keytruda every 3 weeks  INTERVAL HISTORY:  Alexander MANDLERis here for a follow up of lung cancer. He was last seen by Dr. KDelton Coombeson 07/29/20. He presents to the clinic alone. He reports he is feeling great. His only complaint is pain related to arthritis. He reports nocturia x2-3. Otherwise, he denies any other LUTS. He states he presented for PSMA scan but was told it would be rescheduled. He has not heard back from them yet.  All other systems were reviewed with the patient and are negative.  MEDICAL HISTORY:  Past Medical History:  Diagnosis Date   GERD (gastroesophageal reflux disease)    Mass of neck    dx. oropharyngeal squamous cell carcinoma- Chemo. radiation planned   [place]   dx. 3 weeks ago.- [place] [place] [place]   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 08/06/2014   SCCa of Left BOT    SURGICAL HISTORY: Past Surgical History:  Procedure Laterality Date   BIOPSY  01/15/2018   Procedure: BIOPSY;  Surgeon: FDanie Binder MD;  [facility];  Service: Endoscopy;;  gastric   COLONOSCOPY N/A 03/13/2016   Procedure: COLONOSCOPY;  Surgeon: SDanie Binder MD;  [facility];  Service: Endoscopy;  Laterality: N/A;  2:15 PM   ESOPHAGOGASTRODUODENOSCOPY (EGD) WITH PROPOFOL N/A 08/17/2014   Procedure: ESOPHAGOGASTRODUODENOSCOPY (EGD) WITH PROPOFOL (procedure #1);  Surgeon: MAviva SignsMd, MD;  Location: [facility];   Service: General;  Laterality: N/A;   ESOPHAGOGASTRODUODENOSCOPY (EGD) WITH PROPOFOL N/A 01/15/2018   Procedure: ESOPHAGOGASTRODUODENOSCOPY (EGD) WITH PROPOFOL;  Surgeon: FDanie Binder MD;  [facility];  Service: Endoscopy;  Laterality: N/A;  9:30am   MULTIPLE EXTRACTIONS WITH ALVEOLOPLASTY N/A 08/12/2014   Procedure: Extraction of tooth #'s 6,17,22,23,24,25,26,27 with alveoloplasty;  Surgeon: RLenn Cal DDS;  [facility]: [facility];  Service: Oral Surgery;  Laterality: N/A;   PANENDOSCOPY N/A 08/06/2014   Procedure: PANENDOSCOPY WITH BIOPSY;  Surgeon: SLeta Baptist MD;  [facility]  Service: ENT;  Laterality: N/A;   PEG PLACEMENT Left 08/17/14   PEG PLACEMENT N/A 08/17/2014   Procedure: PERCUTANEOUS ENDOSCOPIC GASTROSTOMY (PEG) PLACEMENT (procedure #1);  Surgeon: MAviva SignsMd, MD;  Location: [facility];  Service: General;  Laterality: N/A;   PORT-A-CATH REMOVAL Right 07/17/2016   Procedure: MINOR REMOVAL PORT-A-CATH;  Surgeon: MAviva Signs MD;  Location: [facility];  Service: General;  Laterality: Right;   PORTACATH PLACEMENT Right 08/17/14   PORTACATH PLACEMENT Right 08/17/2014   Procedure: INSERTION PORT-A-CATH (procedure #2);  Surgeon: MAviva SignsMd, MD;  Location: [facility];  Service: General;  Laterality: Right;   PORTACATH PLACEMENT  Left 04/26/2018   Procedure: INSERTION PORT-A-CATH (attached catheter in left subclavian);  Surgeon: Aviva Signs, MD;  Location: [facility];  Service: General;  Laterality: Left;   SAVORY DILATION N/A 01/15/2018   Procedure: SAVORY DILATION;  Surgeon: Danie Binder, MD;  [facility];  Service: Endoscopy;  Laterality: N/A;   VIDEO BRONCHOSCOPY WITH ENDOBRONCHIAL ULTRASOUND N/A 04/15/2018   Procedure: VIDEO BRONCHOSCOPY WITH ENDOBRONCHIAL ULTRASOUND;  Surgeon: Melrose Nakayama, MD;  Location: [facility] OR;  Service: Thoracic;  Laterality: N/A;    I have reviewed the social history and family history with the patient and they  are unchanged from previous note.  ALLERGIES:  has No Known Allergies.  MEDICATIONS:  Current Outpatient Medications  Medication Sig Dispense Refill   feeding supplement, ENSURE ENLIVE, (ENSURE ENLIVE) LIQD Take 237 mLs by mouth 4 (four) times daily.      HYDROcodone-acetaminophen (NORCO/VICODIN) 5-325 MG tablet TAKE 1 TABLET EVERY 12 HOURS AS NEEDED FOR MODERATE PAIN 60 tablet 0   Melatonin 10 MG TABS Take 1 tablet by mouth at bedtime.     naproxen sodium (ALEVE) 220 MG tablet Take 220 mg by mouth daily as needed.      omeprazole (PRILOSEC) 20 MG capsule TAKE 1 CAPSULE BY MOUTH 30 MINUTES PRIOR TO BREAKFAST 90 capsule 0   Pembrolizumab (KEYTRUDA IV) Inject into the vein every 21 ( twenty-one) days.     tamsulosin (FLOMAX) 0.4 MG CAPS capsule TAKE (1) CAPSULE BY MOUTH AT BEDTIME. 30 capsule 6   levothyroxine (SYNTHROID) 88 MCG tablet Take 1 tablet (88 mcg total) by mouth daily before breakfast. 30 tablet 3   No current facility-administered medications for this visit.   Facility-Administered Medications Ordered in Other Visits  Medication Dose Route Frequency Provider Last Rate Last Admin   sodium chloride flush (NS) 0.9 % injection 10 mL  10 mL Intracatheter PRN Derek Jack, MD   10 mL at 06/24/19 4098    PHYSICAL EXAMINATION: ECOG PERFORMANCE STATUS: 1 - Symptomatic but completely ambulatory  There were no vitals filed for this visit. Filed Weights   09/16/20 0951  Weight: 134 lb 12.8 oz (61.1 kg)    GENERAL:alert, no distress and comfortable SKIN: skin color, texture, turgor are normal, no rashes or significant lesions EYES: normal, Conjunctiva are pink and non-injected, sclera clear  NECK: supple, thyroid normal size, non-tender, without nodularity LYMPH:  no palpable lymphadenopathy in the cervical, axillary  LUNGS: clear to auscultation and percussion with normal breathing effort HEART: regular rate & rhythm and no murmurs and no lower extremity  edema ABDOMEN:abdomen soft, non-tender and normal bowel sounds Musculoskeletal:no cyanosis of digits and no clubbing  NEURO: alert & oriented x 3 with fluent speech, no focal motor/sensory deficits  LABORATORY DATA:  I have reviewed the data as listed CBC Latest Ref Rng & Units 09/16/2020 08/25/2020 07/29/2020  WBC 4.0 - 10.5 K/uL 3.7(L) 5.2 4.8  Hemoglobin 13.0 - 17.0 g/dL 12.8(L) 12.8(L) 12.7(L)  Hematocrit 39.0 - 52.0 % 39.6 39.3 38.7(L)  Platelets 150 - 400 K/uL 190 173 172     CMP Latest Ref Rng & Units 09/16/2020 08/25/2020 07/29/2020  Glucose 70 - 99 mg/dL 99 92 115(H)  BUN 8 - 23 mg/dL 21 47(H) 35(H)  Creatinine 0.61 - 1.24 mg/dL 1.75(H) 2.02(H) 1.72(H)  Sodium 135 - 145 mmol/L 139 137 140  Potassium 3.5 - 5.1 mmol/L 4.6 4.6 4.3  Chloride 98 - 111 mmol/L 105 102 106  CO2 22 - 32 mmol/L 27  26 26  Calcium 8.9 - 10.3 mg/dL 9.0 9.4 9.0  Total Protein 6.5 - 8.1 g/dL 6.4(L) 6.9 6.4(L)  Total Bilirubin 0.3 - 1.2 mg/dL 0.4 0.6 0.5  Alkaline Phos 38 - 126 U/L 51 54 55  AST 15 - 41 U/L 22 25 58(H)  ALT 0 - 44 U/L 21 18 69(H)      RADIOGRAPHIC STUDIES: I have personally reviewed the radiological images as listed and agreed with the findings in the report. No results found.   ASSESSMENT & PLAN:  Alexander [name] is a 70 y.o. male with   1.  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 -6 cycles of carboplatin, paclitaxel and pembrolizumab from 05/03/2018 through 08/21/2018. -Maintenance pembrolizumab started on 09/11/2018. -PET scan on 09/15/2019 showed interval decrease in hypermetabolic areas associated with tongue and floor of the mouth.  Hypermetabolic metastatic lymphadenopathy in the chest is stable.  No new sites seen. -PET scan on 03/15/2020 shows persistent, stable hypermetabolism in the tongue/floor of mouth.  Slight interval decrease in hypermetabolism with mediastinal/hilar adenopathy.  Persistent  hypermetabolic focus in the right supraclavicular region.  New focus of hypermetabolic them identified in the right external iliac chain of pelvis with no discernible adenopathy on the CT. -CT C/A/P on 08/27/20 showed: new 1 cm subpleural nodule of LLL; multiple tiny peripheral subpleural pulmonary nodules bilaterally, measuring 2-3 mm; 3.7 cm hypodense mass of inferior pole of left kidney; no significant change in numerous, matted hypodense mediastinal and left hilar lymph nodes; 1.3 cm subcapsular focus of hyperenhancement in inferior left lobe of liver, favoring hemangioma. I reviewed the scan images myself and discussed the results with him today. I reviewed that his left lower lobe lung nodule could be malignant, or inflammatory.  No other definitive evidence of disease progression on scan. Given he is clinically asymptomatic, doing very well, has been on Keytruda for over 2 years, I do not recommend change treatment for now.  We will get close follow-up with CT chest in 2 months.    2.  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 -Last PET scan on 03/15/2020 did not show any evidence of recurrence.   3.  Hypothyroidism: -Continue Synthroid 88 mcg daily. -TSH today is pending, most recent from 08/25/20 was 2.379   4.  Elevated PSA: -Baseline PSA around 13 in 2020.  PSA found to be elevated at 51.8 on 05/17/2020 in [place]. - He is continuing to use Flomax with improvement in urinary stream. - PSA repeated on 06/17/2020 was elevated at 55.81. - Bone scan from 07/20/2020 which did not reveal any metastatic disease. -PSMA scan recommended, and will be rescheduled -PSA today is pending.  5. Left kidney lesion  -Suspicious for malignant, kidney cancer vs metastatic lesion -will get PSMA scan done next to rule out metastatic prostate cancer  -close f/u for now, may consider biopsy if it increase on  next scan    PLAN: -CT scan reviewed and discussed with pt  -will reschedule his PSMA scan. -Synthroid refilled today. -Labs reviewed, adequate to proceed with Keytruda and continue every 3 weeks  -Labs, f/u, and treatment in 3 weeks.   No problem-specific Assessment & Plan notes found for this encounter.   No orders of the defined types were placed in this encounter.  All questions were answered. The patient knows to call the  clinic with any problems, questions or concerns. No barriers to learning was detected. The total time spent in the appointment was 30 minutes.     Truitt Merle, MD 09/16/2020   I, Wilburn Mylar, am acting as scribe for Truitt Merle, MD.   I have reviewed the above documentation for accuracy and completeness, and I agree with the above.

## 2020-09-16 NOTE — Progress Notes (Signed)
Patient presents today for port flush and labs for treatment. Patient's port accessed and flushed without difficulty. Labs obtained from patient's port. Patient tolerated procedure well.

## 2020-09-16 NOTE — Progress Notes (Signed)
Patient presents today for treatment and follow up visit with Dr. Burr Medico. Labs reviewed by MD. Creatinine 1.75. Per Dr. Ernestina Penna note proceed with [facility] today. Patient has no complaints of any significant changes related to his last treatment. Vital signs within parameters for treatment.   Treatment given today per MD orders. Tolerated infusion without adverse affects. Vital signs stable. No complaints at this time. Discharged from clinic ambulatory in stable condition. Alert and oriented x 3. F/U with [facility] as scheduled.

## 2020-09-16 NOTE — Patient Instructions (Signed)
[facility]  Discharge Instructions: Thank you for choosing [facility] to provide your oncology and hematology care.  If you have a lab appointment with the [facility], please come in thru the Main Entrance and check in at the main information desk.  Wear comfortable clothing and clothing appropriate for easy access to any Portacath or PICC line.   We strive to give you quality time with your provider. You may need to reschedule your appointment if you arrive late (15 or more minutes).  Arriving late affects you and other patients whose appointments are after yours.  Also, if you miss three or more appointments without notifying the office, you may be dismissed from the clinic at the provider's discretion.      For prescription refill requests, have your pharmacy contact our office and allow 72 hours for refills to be completed.    Today you received the following chemotherapy and/or immunotherapy agents Keytruda.       To help prevent nausea and vomiting after your treatment, we encourage you to take your nausea medication as directed.  BELOW ARE SYMPTOMS THAT SHOULD BE REPORTED IMMEDIATELY: *FEVER GREATER THAN 100.4 F (38 C) OR HIGHER *CHILLS OR SWEATING *NAUSEA AND VOMITING THAT IS NOT CONTROLLED WITH YOUR NAUSEA MEDICATION *UNUSUAL SHORTNESS OF BREATH *UNUSUAL BRUISING OR BLEEDING *URINARY PROBLEMS (pain or burning when urinating, or frequent urination) *BOWEL PROBLEMS (unusual diarrhea, constipation, pain near the anus) TENDERNESS IN MOUTH AND THROAT WITH OR WITHOUT PRESENCE OF ULCERS (sore throat, sores in mouth, or a toothache) UNUSUAL RASH, SWELLING OR PAIN  UNUSUAL VAGINAL DISCHARGE OR ITCHING   Items with * indicate a potential emergency and should be followed up as soon as possible or go to the Emergency Department if any problems should occur.  Please show the CHEMOTHERAPY ALERT CARD or IMMUNOTHERAPY ALERT CARD at check-in to the Emergency  Department and triage nurse.  Should you have questions after your visit or need to cancel or reschedule your appointment, please contact [facility] [phone]  and follow the prompts.  Office hours are 8:00 a.m. to 4:30 p.m. Monday - Friday. Please note that voicemails left after 4:00 p.m. may not be returned until the following business day.  We are closed weekends and major holidays. You have access to a nurse at all times for urgent questions. Please call the main number to the clinic [phone] and follow the prompts.  For any non-urgent questions, you may also contact your provider using MyChart. We now offer e-Visits for anyone 80 and older to request care online for non-urgent symptoms. For details visit [email or web address].   Also download the MyChart app! Go to the app store, search "MyChart", open the app, select [facility], and log in with your MyChart username and password.  Due to Covid, a mask is required upon entering the hospital/clinic. If you do not have a mask, one will be given to you upon arrival. For doctor visits, patients may have 1 support person aged 71 or older with them. For treatment visits, patients cannot have anyone with them due to current Covid guidelines and our immunocompromised population.

## 2020-10-01 ENCOUNTER — Other Ambulatory Visit (HOSPITAL_COMMUNITY): Payer: Self-pay

## 2020-10-01 ENCOUNTER — Other Ambulatory Visit (HOSPITAL_COMMUNITY): Payer: Self-pay | Admitting: *Deleted

## 2020-10-01 DIAGNOSIS — C3492 Malignant neoplasm of unspecified part of left bronchus or lung: Secondary | ICD-10-CM

## 2020-10-01 MED ORDER — HYDROCODONE-ACETAMINOPHEN 5-325 MG PO TABS: ORAL_TABLET | ORAL | 0 refills | Status: DC

## 2020-10-04 ENCOUNTER — Other Ambulatory Visit: Payer: Self-pay

## 2020-10-04 ENCOUNTER — Ambulatory Visit (HOSPITAL_COMMUNITY)
Admission: RE | Admit: 2020-10-04 | Discharge: 2020-10-04 | Disposition: A | Discharge status: Home / Self Care | Payer: Medicare Other | Source: Ambulatory Visit | Attending: Hematology | Admitting: Hematology

## 2020-10-04 DIAGNOSIS — C3492 Malignant neoplasm of unspecified part of left bronchus or lung: Secondary | ICD-10-CM | POA: Insufficient documentation

## 2020-10-04 DIAGNOSIS — R972 Elevated prostate specific antigen [PSA]: Secondary | ICD-10-CM

## 2020-10-04 MED ORDER — PIFLIFOLASTAT F 18 (PYLARIFY) INJECTION
9.0000 | Freq: Once | INTRAVENOUS | Status: AC
  Administered 2020-10-04: 9 via INTRAVENOUS

## 2020-10-07 ENCOUNTER — Inpatient Hospital Stay (HOSPITAL_COMMUNITY): Payer: Medicare Other | Attending: Hematology

## 2020-10-07 ENCOUNTER — Ambulatory Visit (HOSPITAL_COMMUNITY): Payer: Medicare Other

## 2020-10-07 ENCOUNTER — Telehealth (HOSPITAL_COMMUNITY): Payer: Medicare Other | Admitting: Hematology

## 2020-10-07 DIAGNOSIS — Z87891 Personal history of nicotine dependence: Secondary | ICD-10-CM | POA: Insufficient documentation

## 2020-10-07 DIAGNOSIS — C61 Malignant neoplasm of prostate: Secondary | ICD-10-CM | POA: Insufficient documentation

## 2020-10-07 DIAGNOSIS — E039 Hypothyroidism, unspecified: Secondary | ICD-10-CM | POA: Insufficient documentation

## 2020-10-07 DIAGNOSIS — N189 Chronic kidney disease, unspecified: Secondary | ICD-10-CM | POA: Insufficient documentation

## 2020-10-07 DIAGNOSIS — M25562 Pain in left knee: Secondary | ICD-10-CM | POA: Insufficient documentation

## 2020-10-07 DIAGNOSIS — Z5112 Encounter for antineoplastic immunotherapy: Secondary | ICD-10-CM | POA: Insufficient documentation

## 2020-10-07 DIAGNOSIS — Z923 Personal history of irradiation: Secondary | ICD-10-CM | POA: Insufficient documentation

## 2020-10-07 DIAGNOSIS — N2889 Other specified disorders of kidney and ureter: Secondary | ICD-10-CM | POA: Insufficient documentation

## 2020-10-07 DIAGNOSIS — M25561 Pain in right knee: Secondary | ICD-10-CM | POA: Insufficient documentation

## 2020-10-07 DIAGNOSIS — Z79899 Other long term (current) drug therapy: Secondary | ICD-10-CM | POA: Insufficient documentation

## 2020-10-07 DIAGNOSIS — Z9221 Personal history of antineoplastic chemotherapy: Secondary | ICD-10-CM | POA: Insufficient documentation

## 2020-10-07 DIAGNOSIS — C3492 Malignant neoplasm of unspecified part of left bronchus or lung: Secondary | ICD-10-CM | POA: Insufficient documentation

## 2020-10-07 DIAGNOSIS — Z85810 Personal history of malignant neoplasm of tongue: Secondary | ICD-10-CM | POA: Insufficient documentation

## 2020-10-16 NOTE — Progress Notes (Signed)
Alexander [name], [place] [id number]   CLINIC:  Medical Oncology/Hematology  PCP:  Alexander Evens, MD [place] 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    07/28/2014 Initial Diagnosis   Oropharyngeal cancer    08/03/2014 Imaging   CT CAP- L supraclavicular lymphadenopathy is not completely visualized. This is better seen on the previous neck CT from 07/27/2014. Otherwise, no evidence for metastatic disease in the chest, abdomen, or pelvis.    08/03/2014 Imaging   Bone scan- Uptake at adjacent anterior LEFT 6, 7, 8 ribs likely representing trauma/fractures. Questionable nonspecific increased tracer localization at the posterior RIGHT 8th and 9th ribs, the adjacent nature which raises a a question of trauma as well    08/06/2014 Pathology Results   Dr. Benjamine [name]- Oropharynx, biopsy, Left - INVASIVE SQUAMOUS CELL CARCINOMA.    08/12/2014 Procedure   Dr. Enrique [name]- 1. Multiple extraction of tooth numbers 6, 17, 22, 23, 24, 25, 26, and 27. 3 Quadrants of alveoloplasty    08/17/2014 Pathology Results   PORT and G-TUBE placed by Dr. Carlis Stable.    08/26/2014 PET scan   Large hypermetabolic mass in the left base of tongue. Activity extends across midline to the right base tongue. 2. Intensely hypermetabolic left cervical metastatic lymph nodes. Lymph nodes extend from the left level II position to the left supraclavi    09/01/2014 - 09/22/2014 Chemotherapy   Concurrent chemoradiation with Cisplatin 100 mg/m2 x 2  cycles with Neulasta support. Held cycle #3 d/t renal toxicity.     09/03/2014 - 10/23/2014 Radiation Therapy   Treated in [place], IMRT Alexander [name]).  Base of tongue and bilat neck. Total dose: 70 Gy in 35 fractions. (of note, he did miss several treatments requiring BID dosing towards the end of treatment).     01/25/2015 PET scan   Near complete resolution of metabolic activity at the base of tongue. Minimal residual activity is likely post treatment effect. 2. Complete resolution of metabolic activity above LEFT cervical lymph nodes. No evidence of residual metabolically active     5/36/4680 Procedure   Port-a-cath removed Alexander [name])     05/03/2018 - 08/23/2018 Chemotherapy   The patient had dexamethasone (DECADRON) 4 MG tablet, 8 mg, Oral, Daily, 1 of 1 cycle, Start date: 05/01/2018, End date: 10/23/2018 palonosetron (ALOXI) injection 0.25 mg, 0.25 mg, Intravenous,  Once, 6 of 6 cycles Administration: 0.25 mg (05/03/2018), 0.25 mg (05/24/2018), 0.25 mg (06/14/2018), 0.25 mg (07/09/2018), 0.25 mg (07/30/2018), 0.25 mg (08/21/2018) pegfilgrastim-cbqv (UDENYCA) injection 6 mg, 6 mg, Subcutaneous, Once, 5 of 5 cycles Administration: 6 mg (05/27/2018), 6 mg (06/17/2018), 6 mg (07/11/2018), 6 mg (08/01/2018), 6 mg (08/23/2018) Alexander [name] (PARAPLATIN) 380 mg in sodium chloride 0.9 % 250 mL chemo infusion, 380 mg (100 % of original dose 381 mg), Intravenous,  Once, 6 of 6 cycles Dose modification:   (original dose 381 mg, Cycle 1),   (original dose 309.5 mg, Cycle 2), 307.5 mg (original dose 309.5 mg, Cycle 5) Administration: 380 mg (05/03/2018), 310 mg (05/24/2018), 310 mg (06/14/2018), 340 mg (07/09/2018),  310 mg (07/30/2018), 350 mg (08/21/2018) PACLitaxel (TAXOL) 330 mg in sodium chloride 0.9 % 500 mL chemo infusion (> 16m/m2), 175 mg/m2 = 330 mg (100 % of original dose 175 mg/m2), Intravenous,  Once, 6 of 6 cycles Dose modification: 175 mg/m2 (original dose 175 mg/m2, Cycle 1, Reason: Patient Age) Administration: 330 mg  (05/03/2018), 330 mg (05/24/2018), 330 mg (06/14/2018), 330 mg (07/09/2018), 330 mg (07/30/2018), 330 mg (08/21/2018)   for chemotherapy treatment.     05/24/2018 -  Chemotherapy      Patient is on Antibody Plan: HEAD/NECK PEMBROLIZUMAB Q21D     Squamous cell lung cancer, left ([facility]  06/14/2018 Initial Diagnosis   Squamous cell lung cancer, left ([facility])      CANCER STAGING: Cancer Staging Oropharyngeal carcinoma ([facility] Staging form: Pharynx - Oropharynx, AJCC 7th Edition - Clinical: Stage IVA (T4a, N2b, M0) - Unsigned   INTERVAL HISTORY:  [name] a 70y.o. male, returns for routine follow-up and consideration for next cycle of chemotherapy. CHjalmarwas last seen on 07/29/20.  Due for cycle #41 of Keytruda today.   Overall, he tells me he has been feeling pretty well. He reports he is tolerating the treatment well, and denies skin rashes, blood in urine, difficulty with urination, severe fatigue, or reduced appetite.  Overall, he feels ready for next cycle of chemo today.   REVIEW OF SYSTEMS:  Review of Systems  Constitutional:  Negative for appetite change and fatigue.  Genitourinary:  Negative for difficulty urinating and hematuria.   Skin:  Negative for rash.  All other systems reviewed and are negative.  PAST MEDICAL/SURGICAL HISTORY:  Past Medical History:  Diagnosis Date   GERD (gastroesophageal reflux disease)    Mass of neck    dx. oropharyngeal squamous cell carcinoma- Chemo. radiation planned   Oropharyngeal cancer ([facility] 07/28/2014   dx. 3 weeks ago.- Alexander [name]   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[facility] 08/06/2014   SCCa of Left BOT   Past Surgical History:  Procedure Laterality Date   BIOPSY  01/15/2018   Procedure: BIOPSY;  Surgeon: FDanie Binder MD;  [facility];  Service: Endoscopy;;  gastric   COLONOSCOPY N/A 03/13/2016   Procedure: COLONOSCOPY;  Surgeon: Alexander Binder MD;  [facility];  Service: Endoscopy;  Laterality: N/A;  2:15 PM   ESOPHAGOGASTRODUODENOSCOPY (EGD) WITH PROPOFOL N/A 08/17/2014   Procedure: ESOPHAGOGASTRODUODENOSCOPY (EGD) WITH PROPOFOL (procedure #1);  Surgeon: Alexander SignsMd, MD;  Location: [facility];  Service: General;  Laterality: N/A;   ESOPHAGOGASTRODUODENOSCOPY (EGD) WITH PROPOFOL N/A 01/15/2018   Procedure: ESOPHAGOGASTRODUODENOSCOPY (EGD) WITH PROPOFOL;  Surgeon: FDanie Binder MD;  [facility];  Service: Endoscopy;  Laterality: N/A;  9:30am   MULTIPLE EXTRACTIONS WITH ALVEOLOPLASTY N/A 08/12/2014   Procedure: Extraction of tooth #'s 6,17,22,23,24,25,26,27 with alveoloplasty;  Surgeon: RLenn Cal DDS;  [facility]: [facility];  Service: Oral Surgery;  Laterality: N/A;   PANENDOSCOPY N/A 08/06/2014   Procedure: PANENDOSCOPY WITH BIOPSY;  Surgeon: SLeta Baptist MD;  [facility]  Service: ENT;  Laterality: N/A;   PEG PLACEMENT Left 08/17/14   PEG PLACEMENT N/A 08/17/2014   Procedure: PERCUTANEOUS ENDOSCOPIC GASTROSTOMY (PEG) PLACEMENT (procedure #1);  Surgeon: Alexander SignsMd, MD;  Location: [facility];  Service: General;  Laterality: N/A;   PORT-A-CATH REMOVAL Right 07/17/2016   Procedure: MINOR REMOVAL PORT-A-CATH;  Surgeon: Alexander Signs MD;  Location: [facility];  Service: General;  Laterality: Right;   PORTACATH PLACEMENT Right 08/17/14   PORTACATH PLACEMENT Right 08/17/2014   Procedure: INSERTION PORT-A-CATH (procedure #2);  Surgeon: Aviva Signs Md, MD;  Location: [facility];  Service: General;  Laterality: Right;   PORTACATH PLACEMENT Left 04/26/2018   Procedure: INSERTION PORT-A-CATH (attached catheter in left subclavian);  Surgeon: Aviva Signs, MD;  Location: [facility];  Service: General;  Laterality: Left;   SAVORY DILATION N/A 01/15/2018   Procedure: SAVORY DILATION;  Surgeon: Alexander Binder, MD;  [facility];  Service: Endoscopy;  Laterality: N/A;   VIDEO BRONCHOSCOPY WITH ENDOBRONCHIAL ULTRASOUND N/A  04/15/2018   Procedure: VIDEO BRONCHOSCOPY WITH ENDOBRONCHIAL ULTRASOUND;  Surgeon: Melrose Nakayama, MD;  Location: [facility] OR;  Service: Thoracic;  Laterality: N/A;    SOCIAL HISTORY:  Social History   Socioeconomic History   Marital status: Legally Separated    Spouse name: Not on file   Number of children: 5   Years of education: Not on file   Highest education level: Not on file  Occupational History   Not on file  Tobacco Use   Smoking status: Former    Packs/day: 0.50    Years: 30.00    Pack years: 15.00    Types: Cigarettes    Quit date: 07/22/2014    Years since quitting: 6.2   Smokeless tobacco: Never  Substance and Sexual Activity   Alcohol use: Not Currently    Alcohol/week: 0.0 standard drinks    Comment: None currently (11/09/17); previously 1-2 beers on the weekend   Drug use: No   Sexual activity: Not on file  Other Topics Concern   Not on file  Social History Narrative   Not on file   Social Determinants of Health   Financial Resource Strain: Low Risk    Difficulty of Paying Living Expenses: Not hard at all  Food Insecurity: No Food Insecurity   Worried About Charity fundraiser in the Last Year: Never true   [place] in the Last Year: Never true  Transportation Needs: No Transportation Needs   Lack of Transportation (Medical): No   Lack of Transportation (Non-Medical): No  Physical Activity: Inactive   Days of Exercise per Week: 0 days   Minutes of Exercise per Session: 0 min  Stress: No Stress Concern Present   Feeling of Stress : Not at all  Social Connections: Moderately Isolated   Frequency of Communication with Friends and Family: More than three times a week   Frequency of Social Gatherings with Friends and Family: Twice a week   Attends Religious Services: Never   Marine scientist or Organizations: No   Attends Music therapist: Never   Marital Status: Married  Human resources officer Violence: Not At Risk   Fear of  Current or Ex-Partner: No   Emotionally Abused: No   Physically Abused: No   Sexually Abused: No    FAMILY HISTORY:  Family History  Problem Relation Age of Onset   Colon cancer Neg Hx    Gastric cancer Neg Hx    Esophageal cancer Neg Hx     CURRENT MEDICATIONS:  Current Outpatient Medications  Medication Sig Dispense Refill   feeding supplement, ENSURE ENLIVE, (ENSURE ENLIVE) LIQD Take 237 mLs by mouth 4 (four) times daily.      HYDROcodone-acetaminophen (NORCO/VICODIN) 5-325 MG tablet TAKE 1 TABLET EVERY 12 HOURS AS NEEDED FOR MODERATE PAIN 60 tablet 0   levothyroxine (SYNTHROID) 88 MCG tablet Take 1  tablet (88 mcg total) by mouth daily before breakfast. 30 tablet 3   Melatonin 10 MG TABS Take 1 tablet by mouth at bedtime.     naproxen sodium (ALEVE) 220 MG tablet Take 220 mg by mouth daily as needed.      omeprazole (PRILOSEC) 20 MG capsule TAKE 1 CAPSULE BY MOUTH 30 MINUTES PRIOR TO BREAKFAST 90 capsule 0   Pembrolizumab (KEYTRUDA IV) Inject into the vein every 21 ( twenty-one) days.     tamsulosin (FLOMAX) 0.4 MG CAPS capsule TAKE (1) CAPSULE BY MOUTH AT BEDTIME. 30 capsule 6   No current facility-administered medications for this visit.   Facility-Administered Medications Ordered in Other Visits  Medication Dose Route Frequency Provider Last Rate Last Admin   sodium chloride flush (NS) 0.9 % injection 10 mL  10 mL Intracatheter PRN Derek Jack, MD   10 mL at 06/24/19 2130    ALLERGIES:  No Known Allergies  PHYSICAL EXAM:  Performance status (ECOG): 1 - Symptomatic but completely ambulatory  There were no vitals filed for this visit. Wt Readings from Last 3 Encounters:  09/16/20 134 lb 12.8 oz (61.1 kg)  08/25/20 135 lb 0.2 oz (61.2 kg)  07/29/20 136 lb 9.6 oz (62 kg)   Physical Exam Vitals reviewed.  Constitutional:      Appearance: Normal appearance.  Cardiovascular:     Rate and Rhythm: Normal rate and regular rhythm.     Pulses: Normal pulses.      Heart sounds: Normal heart sounds.  Pulmonary:     Effort: Pulmonary effort is normal.     Breath sounds: Normal breath sounds.  Neurological:     General: No focal deficit present.     Mental Status: He is alert and oriented to person, place, and time.  Psychiatric:        Mood and Affect: Mood normal.        Behavior: Behavior normal.    LABORATORY DATA:  I have reviewed the labs as listed.  CBC Latest Ref Rng & Units 09/16/2020 08/25/2020 07/29/2020  WBC 4.0 - 10.5 K/uL 3.7(L) 5.2 4.8  Hemoglobin 13.0 - 17.0 g/dL 12.8(L) 12.8(L) 12.7(L)  Hematocrit 39.0 - 52.0 % 39.6 39.3 38.7(L)  Platelets 150 - 400 K/uL 190 173 172   CMP Latest Ref Rng & Units 09/16/2020 08/25/2020 07/29/2020  Glucose 70 - 99 mg/dL 99 92 115(H)  BUN 8 - 23 mg/dL 21 47(H) 35(H)  Creatinine 0.61 - 1.24 mg/dL 1.75(H) 2.02(H) 1.72(H)  Sodium 135 - 145 mmol/L 139 137 140  Potassium 3.5 - 5.1 mmol/L 4.6 4.6 4.3  Chloride 98 - 111 mmol/L 105 102 106  CO2 22 - 32 mmol/L 27 26 26   Calcium 8.9 - 10.3 mg/dL 9.0 9.4 9.0  Total Protein 6.5 - 8.1 g/dL 6.4(L) 6.9 6.4(L)  Total Bilirubin 0.3 - 1.2 mg/dL 0.4 0.6 0.5  Alkaline Phos 38 - 126 U/L 51 54 55  AST 15 - 41 U/L 22 25 58(H)  ALT 0 - 44 U/L 21 18 69(H)    DIAGNOSTIC IMAGING:  I have independently reviewed the scans and discussed with the patient. NM PET (PSMA) SKULL TO MID THIGH  Result Date: 10/05/2020 CLINICAL DATA:  Elevated PSA. History of head neck cancer. PSA equal 55 EXAM: NUCLEAR MEDICINE PET SKULL BASE TO THIGH TECHNIQUE: 8.8 mCi F18 Piflufolastat (Pylarify) was injected intravenously. Full-ring PET imaging was performed from the skull base to thigh after the radiotracer. CT data was obtained and used for  attenuation correction and anatomic localization. COMPARISON:  FDG PET scan 03/15/2020, MDP bone scan 07/20/2020 FINDINGS: NECK No radiotracer activity in neck lymph nodes. Incidental CT finding: None CHEST No radiotracer accumulation within mediastinal or hilar  lymph nodes. Nodule in the medial LEFT lung base measuring 10 mm without radiotracer activity. Incidental CT finding: Irregular partially calcified mediastinal nodes again noted. No significant radiotracer activity. ABDOMEN/PELVIS Prostate: There is intense radiotracer activity throughout the enlarged prostate gland with SUV max equal 47.1. The activity is uniform throughout the gland which measures 4.2 x 4.3 x 4.0 cm. Radiotracer activity extends into the LEFT seminal vesicle (image 205). Difficult to discern the RIGHT seminal vesicle from the RIGHT ureter. Lymph nodes: Radiotracer activity localizes to a RIGHT operator node measuring 6 mm (image 194/4) with SUV max equal 23.5. Similar activity very small sub 5 mm LEFT operator node with SUV max equal 10.6 on image 194. No radiotracer avid lymph nodes present outside of the pelvis. Liver: No radiotracer avid lesions within the liver. Multiple hepatic cysts are noted. Incidental CT finding: Mass in lower pole LEFT kidney not well identified on noncontrast CT. No radiotracer activity SKELETON No focal  activity to suggest skeletal metastasis. IMPRESSION: 1. Intense radiotracer activity uniformly distributed throughout enlarged prostate gland is most consistent with primary prostate adenocarcinoma. 2. Concern for local extension into the LEFT seminal vesicle. 3. Small radiotracer avid operator nodes are consistent with local nodal metastasis. 4. No evidence of metastatic adenopathy outside the pelvis, visceral metastasis, or skeletal metastasis. 5. No PSMA activity of LEFT lower lobe pulmonary nodule or LEFT renal mass. Electronically Signed   By: Suzy Bouchard M.D.   On: 10/05/2020 15:33     ASSESSMENT:  1.  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 -6 cycles of Alexander [name], paclitaxel and pembrolizumab from 05/03/2018 through 08/21/2018. -Maintenance pembrolizumab started on  09/11/2018. -PET scan on 09/15/2019 showed interval decrease in hypermetabolic areas associated with tongue and floor of the mouth.  Hypermetabolic metastatic lymphadenopathy in the chest is stable.  No new sites seen. -PET scan on 03/15/2020 shows persistent, stable hypermetabolism in the tongue/floor of mouth.  Slight interval decrease in hypermetabolism with mediastinal/hilar adenopathy.  Persistent hypermetabolic focus in the right supraclavicular region.  New focus of hypermetabolic them identified in the right external iliac chain of pelvis with no discernible adenopathy on the CT.   2.  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   PLAN:  1.  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 - Denies any immunotherapy related side effects including colitis and pneumonitis.  Reviewed his labs today which showed normal LFTs and stable elevated creatinine.  CBC was grossly normal. - He does have some itching from dry skin.  He will use moisturizing lotion. - He will proceed with Keytruda today and in 3 weeks. - RTC 6 weeks for follow-up.  We will repeat CT CAP in 6 weeks.   2.  Hypothyroidism: - TSH today is 3.4.  Continue Synthroid 88 mcg daily.   3.  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   4.  Bilateral knee pains: - Continue hydrocodone twice daily as needed.   5.  Nutrition: - He lost about 2  to 4 pounds which she generally does during the summertime.  Otherwise continue 2 to 4 cans of boost per day.   6.  CKD: - Baseline creatinine between 1.5-1.8.  Today creatinine is 1.57.   7.  Prostate cancer: - PSA on 09/16/2020 was 53.7. - He does not report any hematuria or difficulty urination.  He is taking Flomax which is helping. - We reviewed results  of PSMA PET CT scan from 10/04/2020 which showed intense radiotracer activity uniformly distributed throughout the enlarged prostate gland consistent with prostatic adenocarcinoma.  There is concern for local extension into the left seminal vesicle.  Small hypermetabolic obturator lymph nodes bilaterally consistent with local nodal metastasis.  No evidence of visceral or skeletal metastasis.  No PSMA activity of the left lower lobe lung nodule or left kidney mass. - We will make a referral to Dr. Alyson Ingles.  8.  Left kidney mass: - CT CAP on 08/27/2020 showed hypodense mass in the inferior pole of the left kidney measuring 3.7 x 3.4 x 3.6 cm, suspicious for RCC. - We will make a referral to Dr. Alyson Ingles.   Orders placed this encounter:  No orders of the defined types were placed in this encounter.    Derek Jack, MD [place] [phone]   I, Thana Ates, am acting as a scribe for Dr. Derek Jack.  I, Derek Jack MD, have reviewed the above documentation for accuracy and completeness, and I agree with the above.

## 2020-10-18 ENCOUNTER — Other Ambulatory Visit: Payer: Self-pay

## 2020-10-18 ENCOUNTER — Inpatient Hospital Stay (HOSPITAL_BASED_OUTPATIENT_CLINIC_OR_DEPARTMENT_OTHER): Payer: Medicare Other | Admitting: Hematology

## 2020-10-18 ENCOUNTER — Inpatient Hospital Stay (HOSPITAL_COMMUNITY): Payer: Medicare Other

## 2020-10-18 VITALS — BP 104/62 | HR 68 | Temp 98.4°F | Resp 18

## 2020-10-18 VITALS — BP 103/61 | HR 81 | Temp 96.7°F | Resp 18 | Wt 132.2 lb

## 2020-10-18 DIAGNOSIS — R972 Elevated prostate specific antigen [PSA]: Secondary | ICD-10-CM

## 2020-10-18 DIAGNOSIS — C109 Malignant neoplasm of oropharynx, unspecified: Secondary | ICD-10-CM

## 2020-10-18 DIAGNOSIS — Z87891 Personal history of nicotine dependence: Secondary | ICD-10-CM | POA: Diagnosis not present

## 2020-10-18 DIAGNOSIS — M25562 Pain in left knee: Secondary | ICD-10-CM | POA: Diagnosis not present

## 2020-10-18 DIAGNOSIS — E039 Hypothyroidism, unspecified: Secondary | ICD-10-CM | POA: Diagnosis not present

## 2020-10-18 DIAGNOSIS — M25561 Pain in right knee: Secondary | ICD-10-CM | POA: Diagnosis not present

## 2020-10-18 DIAGNOSIS — Z85810 Personal history of malignant neoplasm of tongue: Secondary | ICD-10-CM | POA: Diagnosis not present

## 2020-10-18 DIAGNOSIS — Z5112 Encounter for antineoplastic immunotherapy: Secondary | ICD-10-CM | POA: Diagnosis not present

## 2020-10-18 DIAGNOSIS — Z79899 Other long term (current) drug therapy: Secondary | ICD-10-CM | POA: Diagnosis not present

## 2020-10-18 DIAGNOSIS — C3492 Malignant neoplasm of unspecified part of left bronchus or lung: Secondary | ICD-10-CM

## 2020-10-18 DIAGNOSIS — Z923 Personal history of irradiation: Secondary | ICD-10-CM | POA: Diagnosis not present

## 2020-10-18 DIAGNOSIS — N2889 Other specified disorders of kidney and ureter: Secondary | ICD-10-CM | POA: Diagnosis not present

## 2020-10-18 DIAGNOSIS — N189 Chronic kidney disease, unspecified: Secondary | ICD-10-CM | POA: Diagnosis not present

## 2020-10-18 DIAGNOSIS — C61 Malignant neoplasm of prostate: Secondary | ICD-10-CM | POA: Diagnosis not present

## 2020-10-18 DIAGNOSIS — Z9221 Personal history of antineoplastic chemotherapy: Secondary | ICD-10-CM | POA: Diagnosis not present

## 2020-10-18 LAB — CBC WITH DIFFERENTIAL/PLATELET
Abs Immature Granulocytes: 0.01 10*3/uL (ref 0.00–0.07)
Basophils Absolute: 0 10*3/uL (ref 0.0–0.1)
Basophils Relative: 1 %
Eosinophils Absolute: 0.2 10*3/uL (ref 0.0–0.5)
Eosinophils Relative: 4 %
HCT: 39.3 % (ref 39.0–52.0)
Hemoglobin: 12.9 g/dL — ABNORMAL LOW (ref 13.0–17.0)
Immature Granulocytes: 0 %
Lymphocytes Relative: 31 %
Lymphs Abs: 1.3 10*3/uL (ref 0.7–4.0)
MCH: 30.6 pg (ref 26.0–34.0)
MCHC: 32.8 g/dL (ref 30.0–36.0)
MCV: 93.3 fL (ref 80.0–100.0)
Monocytes Absolute: 0.5 10*3/uL (ref 0.1–1.0)
Monocytes Relative: 13 %
Neutro Abs: 2.2 10*3/uL (ref 1.7–7.7)
Neutrophils Relative %: 51 %
Platelets: 184 10*3/uL (ref 150–400)
RBC: 4.21 MIL/uL — ABNORMAL LOW (ref 4.22–5.81)
RDW: 15 % (ref 11.5–15.5)
WBC: 4.1 10*3/uL (ref 4.0–10.5)
nRBC: 0 % (ref 0.0–0.2)

## 2020-10-18 LAB — COMPREHENSIVE METABOLIC PANEL
ALT: 16 U/L (ref 0–44)
AST: 19 U/L (ref 15–41)
Albumin: 3.6 g/dL (ref 3.5–5.0)
Alkaline Phosphatase: 59 U/L (ref 38–126)
Anion gap: 7 (ref 5–15)
BUN: 20 mg/dL (ref 8–23)
CO2: 28 mmol/L (ref 22–32)
Calcium: 8.8 mg/dL — ABNORMAL LOW (ref 8.9–10.3)
Chloride: 103 mmol/L (ref 98–111)
Creatinine, Ser: 1.57 mg/dL — ABNORMAL HIGH (ref 0.61–1.24)
GFR, Estimated: 47 mL/min — ABNORMAL LOW (ref >60)
Glucose, Bld: 85 mg/dL (ref 70–99)
Potassium: 4.1 mmol/L (ref 3.5–5.1)
Sodium: 138 mmol/L (ref 135–145)
Total Bilirubin: 0.4 mg/dL (ref 0.3–1.2)
Total Protein: 6.7 g/dL (ref 6.5–8.1)

## 2020-10-18 LAB — TSH: TSH: 3.486 u[IU]/mL (ref 0.350–4.500)

## 2020-10-18 MED ORDER — SODIUM CHLORIDE 0.9 % IV SOLN: Freq: Once | INTRAVENOUS | Status: AC

## 2020-10-18 MED ORDER — SODIUM CHLORIDE 0.9% FLUSH
10.0000 mL | INTRAVENOUS | Status: DC | PRN
  Administered 2020-10-18 (×2): 10 mL

## 2020-10-18 MED ORDER — SODIUM CHLORIDE 0.9 % IV SOLN
200.0000 mg | Freq: Once | INTRAVENOUS | Status: AC
  Administered 2020-10-18: 200 mg via INTRAVENOUS
  Filled 2020-10-18: qty 8

## 2020-10-18 MED ORDER — HEPARIN SOD (PORK) LOCK FLUSH 100 UNIT/ML IV SOLN
500.0000 [IU] | Freq: Once | INTRAVENOUS | Status: AC | PRN
  Administered 2020-10-18: 500 [IU]

## 2020-10-18 NOTE — Progress Notes (Signed)
Patients port flushed without difficulty.  Good blood return noted with no bruising or swelling noted at site.  stable during access and blood draw.  Patient to remain accessed for treatment.

## 2020-10-18 NOTE — Progress Notes (Signed)
Patient has been assessed, vital signs and labs have been reviewed by Dr. Katragadda. ANC, Creatinine, LFTs, and Platelets are within treatment parameters per Dr. Katragadda. The patient is good to proceed with treatment at this time. Primary RN and pharmacy aware.  

## 2020-10-18 NOTE — Patient Instructions (Signed)
[facility]  Discharge Instructions: Thank you for choosing [facility] to provide your oncology and hematology care.  If you have a lab appointment with the [facility], please come in thru the Main Entrance and check in at the main information desk.  Wear comfortable clothing and clothing appropriate for easy access to any Portacath or PICC line.   We strive to give you quality time with your provider. You may need to reschedule your appointment if you arrive late (15 or more minutes).  Arriving late affects you and other patients whose appointments are after yours.  Also, if you miss three or more appointments without notifying the office, you may be dismissed from the clinic at the provider's discretion.      For prescription refill requests, have your pharmacy contact our office and allow 72 hours for refills to be completed.    Today you received the following chemotherapy and/or immunotherapy agents Keytruda, return as scheduled.   To help prevent nausea and vomiting after your treatment, we encourage you to take your nausea medication as directed.  BELOW ARE SYMPTOMS THAT SHOULD BE REPORTED IMMEDIATELY: *FEVER GREATER THAN 100.4 F (38 C) OR HIGHER *CHILLS OR SWEATING *NAUSEA AND VOMITING THAT IS NOT CONTROLLED WITH YOUR NAUSEA MEDICATION *UNUSUAL SHORTNESS OF BREATH *UNUSUAL BRUISING OR BLEEDING *URINARY PROBLEMS (pain or burning when urinating, or frequent urination) *BOWEL PROBLEMS (unusual diarrhea, constipation, pain near the anus) TENDERNESS IN MOUTH AND THROAT WITH OR WITHOUT PRESENCE OF ULCERS (sore throat, sores in mouth, or a toothache) UNUSUAL RASH, SWELLING OR PAIN  UNUSUAL VAGINAL DISCHARGE OR ITCHING   Items with * indicate a potential emergency and should be followed up as soon as possible or go to the Emergency Department if any problems should occur.  Please show the CHEMOTHERAPY ALERT CARD or IMMUNOTHERAPY ALERT CARD at check-in to  the Emergency Department and triage nurse.  Should you have questions after your visit or need to cancel or reschedule your appointment, please contact [facility] [phone]  and follow the prompts.  Office hours are 8:00 a.m. to 4:30 p.m. Monday - Friday. Please note that voicemails left after 4:00 p.m. may not be returned until the following business day.  We are closed weekends and major holidays. You have access to a nurse at all times for urgent questions. Please call the main number to the clinic [phone] and follow the prompts.  For any non-urgent questions, you may also contact your provider using MyChart. We now offer e-Visits for anyone 72 and older to request care online for non-urgent symptoms. For details visit [email or web address].   Also download the MyChart app! Go to the app store, search "MyChart", open the app, select [facility], and log in with your MyChart username and password.  Due to Covid, a mask is required upon entering the hospital/clinic. If you do not have a mask, one will be given to you upon arrival. For doctor visits, patients may have 1 support person aged 84 or older with them. For treatment visits, patients cannot have anyone with them due to current Covid guidelines and our immunocompromised population.

## 2020-10-18 NOTE — Patient Instructions (Addendum)
[place] at [facility] Discharge Instructions  You were seen today by Dr. Delton Coombes. He went over your recent results, and you received your treatment. You will be scheduled for a CT scan of your chest, abdomen, and pelvis, prior to your next appointment. You have been referred to Dr. Noah Delaine for a urology consultation. Dr. Delton Coombes will see you back in 6 weeks for labs and follow up.   Thank you for choosing [place] at [facility] to provide your oncology and hematology care.  To afford each patient quality time with our provider, please arrive at least 15 minutes before your scheduled appointment time.   If you have a lab appointment with the [facility] please come in thru the Main Entrance and check in at the main information desk  You need to re-schedule your appointment should you arrive 10 or more minutes late.  We strive to give you quality time with our providers, and arriving late affects you and other patients whose appointments are after yours.  Also, if you no show three or more times for appointments you may be dismissed from the clinic at the providers discretion.     Again, thank you for choosing [facility].  Our hope is that these requests will decrease the amount of time that you wait before being seen by our physicians.       _____________________________________________________________  Should you have questions after your visit to [facility], please contact our office at [phone] between the hours of 8:00 a.m. and 4:30 p.m.  Voicemails left after 4:00 p.m. will not be returned until the following business day.  For prescription refill requests, have your pharmacy contact our office and allow 72 hours.    Cancer Center Support Programs:   > Cancer Support Group  2nd Tuesday of the month 1pm-2pm, Journey Room

## 2020-10-18 NOTE — Progress Notes (Signed)
Patient presents today for Keytruda. Patient assessed by Dr. Delton Coombes and labs reviewed. Ser. Creatine 1.57, okay to proceed with treatment per Dr. Delton Coombes.   Patient tolerated therapy with no complaints voiced. Side effects with management reviewed with understanding verbalized. Port site clean and dry with no bruising or swelling noted at site. Good blood return noted before and after administration of therapy. Band aid applied. Patient left in satisfactory condition with VSS and no s/s of distress noted.

## 2020-11-03 ENCOUNTER — Other Ambulatory Visit (HOSPITAL_COMMUNITY): Payer: Self-pay | Admitting: *Deleted

## 2020-11-03 DIAGNOSIS — C3492 Malignant neoplasm of unspecified part of left bronchus or lung: Secondary | ICD-10-CM

## 2020-11-03 MED ORDER — HYDROCODONE-ACETAMINOPHEN 5-325 MG PO TABS: ORAL_TABLET | ORAL | 0 refills | Status: DC

## 2020-11-05 ENCOUNTER — Encounter (HOSPITAL_COMMUNITY): Payer: Self-pay | Admitting: Internal Medicine

## 2020-11-08 ENCOUNTER — Encounter (HOSPITAL_COMMUNITY): Payer: Self-pay

## 2020-11-08 ENCOUNTER — Inpatient Hospital Stay (HOSPITAL_COMMUNITY): Payer: Medicare Other

## 2020-11-08 ENCOUNTER — Other Ambulatory Visit: Payer: Self-pay

## 2020-11-08 ENCOUNTER — Inpatient Hospital Stay (HOSPITAL_COMMUNITY): Payer: Medicare Other | Attending: Hematology

## 2020-11-08 VITALS — BP 110/60 | HR 65 | Temp 97.9°F | Resp 18

## 2020-11-08 DIAGNOSIS — Z87891 Personal history of nicotine dependence: Secondary | ICD-10-CM | POA: Insufficient documentation

## 2020-11-08 DIAGNOSIS — M25561 Pain in right knee: Secondary | ICD-10-CM | POA: Diagnosis not present

## 2020-11-08 DIAGNOSIS — Z9221 Personal history of antineoplastic chemotherapy: Secondary | ICD-10-CM | POA: Diagnosis not present

## 2020-11-08 DIAGNOSIS — Z923 Personal history of irradiation: Secondary | ICD-10-CM | POA: Insufficient documentation

## 2020-11-08 DIAGNOSIS — Z85810 Personal history of malignant neoplasm of tongue: Secondary | ICD-10-CM | POA: Diagnosis not present

## 2020-11-08 DIAGNOSIS — N189 Chronic kidney disease, unspecified: Secondary | ICD-10-CM | POA: Diagnosis not present

## 2020-11-08 DIAGNOSIS — Z79899 Other long term (current) drug therapy: Secondary | ICD-10-CM | POA: Diagnosis not present

## 2020-11-08 DIAGNOSIS — E039 Hypothyroidism, unspecified: Secondary | ICD-10-CM | POA: Diagnosis not present

## 2020-11-08 DIAGNOSIS — M25562 Pain in left knee: Secondary | ICD-10-CM | POA: Diagnosis not present

## 2020-11-08 DIAGNOSIS — Z5112 Encounter for antineoplastic immunotherapy: Secondary | ICD-10-CM | POA: Insufficient documentation

## 2020-11-08 DIAGNOSIS — C61 Malignant neoplasm of prostate: Secondary | ICD-10-CM | POA: Diagnosis not present

## 2020-11-08 DIAGNOSIS — N2889 Other specified disorders of kidney and ureter: Secondary | ICD-10-CM | POA: Insufficient documentation

## 2020-11-08 DIAGNOSIS — C3492 Malignant neoplasm of unspecified part of left bronchus or lung: Secondary | ICD-10-CM

## 2020-11-08 DIAGNOSIS — C109 Malignant neoplasm of oropharynx, unspecified: Secondary | ICD-10-CM

## 2020-11-08 LAB — CBC WITH DIFFERENTIAL/PLATELET
Abs Immature Granulocytes: 0.01 10*3/uL (ref 0.00–0.07)
Basophils Absolute: 0 10*3/uL (ref 0.0–0.1)
Basophils Relative: 0 %
Eosinophils Absolute: 0.1 10*3/uL (ref 0.0–0.5)
Eosinophils Relative: 3 %
HCT: 38.2 % — ABNORMAL LOW (ref 39.0–52.0)
Hemoglobin: 12.5 g/dL — ABNORMAL LOW (ref 13.0–17.0)
Immature Granulocytes: 0 %
Lymphocytes Relative: 23 %
Lymphs Abs: 1.1 10*3/uL (ref 0.7–4.0)
MCH: 30.8 pg (ref 26.0–34.0)
MCHC: 32.7 g/dL (ref 30.0–36.0)
MCV: 94.1 fL (ref 80.0–100.0)
Monocytes Absolute: 0.6 10*3/uL (ref 0.1–1.0)
Monocytes Relative: 12 %
Neutro Abs: 2.9 10*3/uL (ref 1.7–7.7)
Neutrophils Relative %: 62 %
Platelets: 206 10*3/uL (ref 150–400)
RBC: 4.06 MIL/uL — ABNORMAL LOW (ref 4.22–5.81)
RDW: 15.7 % — ABNORMAL HIGH (ref 11.5–15.5)
WBC: 4.7 10*3/uL (ref 4.0–10.5)
nRBC: 0 % (ref 0.0–0.2)

## 2020-11-08 LAB — COMPREHENSIVE METABOLIC PANEL
ALT: 18 U/L (ref 0–44)
AST: 21 U/L (ref 15–41)
Albumin: 3.7 g/dL (ref 3.5–5.0)
Alkaline Phosphatase: 52 U/L (ref 38–126)
Anion gap: 7 (ref 5–15)
BUN: 32 mg/dL — ABNORMAL HIGH (ref 8–23)
CO2: 28 mmol/L (ref 22–32)
Calcium: 9 mg/dL (ref 8.9–10.3)
Chloride: 104 mmol/L (ref 98–111)
Creatinine, Ser: 1.9 mg/dL — ABNORMAL HIGH (ref 0.61–1.24)
GFR, Estimated: 37 mL/min — ABNORMAL LOW (ref >60)
Glucose, Bld: 102 mg/dL — ABNORMAL HIGH (ref 70–99)
Potassium: 4.4 mmol/L (ref 3.5–5.1)
Sodium: 139 mmol/L (ref 135–145)
Total Bilirubin: 0.5 mg/dL (ref 0.3–1.2)
Total Protein: 6.8 g/dL (ref 6.5–8.1)

## 2020-11-08 LAB — TSH: TSH: 3.599 u[IU]/mL (ref 0.350–4.500)

## 2020-11-08 MED ORDER — SODIUM CHLORIDE 0.9 % IV SOLN: INTRAVENOUS | Status: DC

## 2020-11-08 MED ORDER — SODIUM CHLORIDE 0.9 % IV SOLN: Freq: Once | INTRAVENOUS | Status: AC

## 2020-11-08 MED ORDER — SODIUM CHLORIDE 0.9% FLUSH
10.0000 mL | INTRAVENOUS | Status: DC | PRN
  Administered 2020-11-08 (×2): 10 mL

## 2020-11-08 MED ORDER — HEPARIN SOD (PORK) LOCK FLUSH 100 UNIT/ML IV SOLN
500.0000 [IU] | Freq: Once | INTRAVENOUS | Status: AC | PRN
  Administered 2020-11-08: 500 [IU]

## 2020-11-08 MED ORDER — SODIUM CHLORIDE 0.9 % IV SOLN
200.0000 mg | Freq: Once | INTRAVENOUS | Status: AC
  Administered 2020-11-08: 200 mg via INTRAVENOUS
  Filled 2020-11-08: qty 8

## 2020-11-08 MED ORDER — TAMSULOSIN HCL 0.4 MG PO CAPS: ORAL_CAPSULE | ORAL | 6 refills | Status: DC

## 2020-11-08 NOTE — Progress Notes (Signed)
Per secure chat Ok to treat with SCr today

## 2020-11-08 NOTE — Progress Notes (Signed)
Patient presents today for treatment, no new complaints from patient. Ser. Creatinine 1.90, Dr. Delton Coombes made aware, okay to treat per Dr. Delton Coombes, received verbal orders for 545ml of normal saline to be administered with treatment. Patient tolerated 553mL Normal Saline infusion with no complaints voiced.  Patient tolerated Keytruda with no complaints voiced. Side effects with management reviewed understanding verbalized. Port site clean and dry with no bruising or swelling noted at site. Good blood return noted before and after administration of Keytruda. Band aid applied. Patient left in satisfactory condition with VSS and no s/s of distress noted.

## 2020-11-24 ENCOUNTER — Other Ambulatory Visit: Payer: Self-pay

## 2020-11-24 ENCOUNTER — Ambulatory Visit (HOSPITAL_COMMUNITY)
Admission: RE | Admit: 2020-11-24 | Discharge: 2020-11-24 | Disposition: A | Discharge status: Home / Self Care | Payer: Medicare Other | Source: Ambulatory Visit | Attending: Hematology | Admitting: Hematology

## 2020-11-24 ENCOUNTER — Encounter (HOSPITAL_COMMUNITY): Payer: Self-pay | Admitting: Radiology

## 2020-11-24 DIAGNOSIS — R972 Elevated prostate specific antigen [PSA]: Secondary | ICD-10-CM | POA: Diagnosis present

## 2020-11-24 DIAGNOSIS — C3492 Malignant neoplasm of unspecified part of left bronchus or lung: Secondary | ICD-10-CM

## 2020-11-24 MED ORDER — IOHEXOL 350 MG/ML SOLN
100.0000 mL | Freq: Once | INTRAVENOUS | Status: AC | PRN
  Administered 2020-11-24: 70 mL via INTRAVENOUS

## 2020-11-28 NOTE — Progress Notes (Signed)
Alexander [name], [name] [id number]   CLINIC:  Medical Oncology/Hematology  PCP:  Alexander Evens, MD [place]. 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   07/28/2014 Initial Diagnosis   Oropharyngeal cancer   08/03/2014 Imaging   CT CAP- L supraclavicular lymphadenopathy is not completely visualized. This is better seen on the previous neck CT from 07/27/2014. Otherwise, no evidence for metastatic disease in the chest, abdomen, or pelvis.   08/03/2014 Imaging   Bone scan- Uptake at adjacent anterior LEFT 6, 7, 8 ribs likely representing trauma/fractures. Questionable nonspecific increased tracer localization at the posterior RIGHT 8th and 9th ribs, the adjacent nature which raises a a question of trauma as well   08/06/2014 Pathology Results   Dr. Benjamine [name]- Oropharynx, biopsy, Left - INVASIVE SQUAMOUS CELL CARCINOMA.   08/12/2014 Procedure   Dr. Enrique [name]- 1. Multiple extraction of tooth numbers 6, 17, 22, 23, 24, 25, 26, and 27. 3 Quadrants of alveoloplasty   08/17/2014 Pathology Results   PORT and G-TUBE placed by Dr. Carlis [name].   08/26/2014 PET scan   Large hypermetabolic mass in the left base of tongue. Activity extends across midline to the right base tongue. 2. Intensely hypermetabolic left cervical metastatic lymph nodes. Lymph nodes extend from the left level II position to the left supraclavi   09/01/2014 - 09/22/2014 Chemotherapy   Concurrent chemoradiation with Cisplatin 100 mg/m2 x 2 cycles with Neulasta  support. Held cycle #3 d/t renal toxicity.    09/03/2014 - 10/23/2014 Radiation Therapy   Treated in [place], IMRT Alexander [name]).  Base of tongue and bilat neck. Total dose: 70 Gy in 35 fractions. (of note, he did miss several treatments requiring BID dosing towards the end of treatment).    01/25/2015 PET scan   Near complete resolution of metabolic activity at the base of tongue. Minimal residual activity is likely post treatment effect. 2. Complete resolution of metabolic activity above LEFT cervical lymph nodes. No evidence of residual metabolically active    9/74/1638 Procedure   Port-a-cath removed Alexander [name])    05/03/2018 - 08/23/2018 Chemotherapy   The patient had dexamethasone (DECADRON) 4 MG tablet, 8 mg, Oral, Daily, 1 of 1 cycle, Start date: 05/01/2018, End date: 10/23/2018 palonosetron (ALOXI) injection 0.25 mg, 0.25 mg, Intravenous,  Once, 6 of 6 cycles Administration: 0.25 mg (05/03/2018), 0.25 mg (05/24/2018), 0.25 mg (06/14/2018), 0.25 mg (07/09/2018), 0.25 mg (07/30/2018), 0.25 mg (08/21/2018) pegfilgrastim-cbqv (UDENYCA) injection 6 mg, 6 mg, Subcutaneous, Once, 5 of 5 cycles Administration: 6 mg (05/27/2018), 6 mg (06/17/2018), 6 mg (07/11/2018), 6 mg (08/01/2018), 6 mg (08/23/2018) CARBOplatin (PARAPLATIN) 380 mg in sodium chloride 0.9 % 250 mL chemo infusion, 380 mg (100 % of original dose 381 mg), Intravenous,  Once, 6 of 6 cycles Dose modification:   (original dose 381 mg, Cycle 1),   (original dose 309.5 mg, Cycle 2), 307.5 mg (original dose 309.5 mg, Cycle 5) Administration: 380 mg (05/03/2018), 310 mg (05/24/2018), 310 mg (06/14/2018), 340 mg (07/09/2018), 310 mg (07/30/2018), 350 mg (08/21/2018) PACLitaxel (TAXOL) 330 mg in sodium chloride  0.9 % 500 mL chemo infusion (> 36m/m2), 175 mg/m2 = 330 mg (100 % of original dose 175 mg/m2), Intravenous,  Once, 6 of 6 cycles Dose modification: 175 mg/m2 (original dose 175 mg/m2, Cycle 1, Reason: Patient Age) Administration: 330 mg (05/03/2018), 330 mg (05/24/2018),  330 mg (06/14/2018), 330 mg (07/09/2018), 330 mg (07/30/2018), 330 mg (08/21/2018)   for chemotherapy treatment.     05/24/2018 -  Chemotherapy      Patient is on Antibody Plan: HEAD/NECK PEMBROLIZUMAB Q21D     Squamous cell lung cancer, left ([place]  06/14/2018 Initial Diagnosis   Squamous cell lung cancer, left (HCC)     CANCER STAGING: Cancer Staging Oropharyngeal carcinoma ([place] Staging form: Pharynx - Oropharynx, AJCC 7th Edition - Clinical: Stage IVA (T4a, N2b, M0) - Unsigned   INTERVAL HISTORY:  [name] a 70y.o. male, returns for routine follow-up and consideration for next cycle of chemotherapy. CElvertwas last seen on 10/18/2020.  Due for cycle #43 of Keytruda today.   Overall, he tells me he has been feeling pretty well. He denies n/v/d, cough, SOB, significant weight loss, and hematuria. He reports drinking plenty of fluids and good appetite.   Overall, he feels ready for next cycle of chemo today.   REVIEW OF SYSTEMS:  Review of Systems  Constitutional:  Negative for appetite change (50%), fatigue (75%) and unexpected weight change.  Respiratory:  Negative for cough and shortness of breath.   Gastrointestinal:  Negative for diarrhea, nausea and vomiting.  Genitourinary:  Negative for hematuria.   Musculoskeletal:  Positive for arthralgias (7/10 bilateral knees).  All other systems reviewed and are negative.  PAST MEDICAL/SURGICAL HISTORY:  Past Medical History:  Diagnosis Date   GERD (gastroesophageal reflux disease)    Mass of neck    dx. oropharyngeal squamous cell carcinoma- Chemo. radiation planned   [place] cancer ([place] 07/28/2014   [place] 3 [place] [place] [place]   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[place] 08/06/2014   SCCa of Left BOT   Past Surgical History:  Procedure Laterality Date   BIOPSY  01/15/2018   Procedure: BIOPSY;  Surgeon: FDanie Binder MD;  [facility];  Service:  Endoscopy;;  gastric   COLONOSCOPY N/A 03/13/2016   Procedure: COLONOSCOPY;  Surgeon: SDanie Binder MD;  [facility];  Service: Endoscopy;  Laterality: N/A;  2:15 PM   ESOPHAGOGASTRODUODENOSCOPY (EGD) WITH PROPOFOL N/A 08/17/2014   Procedure: ESOPHAGOGASTRODUODENOSCOPY (EGD) WITH PROPOFOL (procedure #1);  Surgeon: MAviva SignsMd, MD;  Location: [facility];  Service: General;  Laterality: N/A;   ESOPHAGOGASTRODUODENOSCOPY (EGD) WITH PROPOFOL N/A 01/15/2018   Procedure: ESOPHAGOGASTRODUODENOSCOPY (EGD) WITH PROPOFOL;  Surgeon: FDanie Binder MD;  [facility];  Service: Endoscopy;  Laterality: N/A;  9:30am   MULTIPLE EXTRACTIONS WITH ALVEOLOPLASTY N/A 08/12/2014   Procedure: Extraction of tooth #'s 6,17,22,23,24,25,26,27 with alveoloplasty;  Surgeon: RLenn Cal DDS;  Location: [facility];  Service: Oral Surgery;  Laterality: N/A;   PANENDOSCOPY N/A 08/06/2014   Procedure: PANENDOSCOPY WITH BIOPSY;  Surgeon: SLeta Baptist MD;  [place]  Service: ENT;  Laterality: N/A;   PEG PLACEMENT Left 08/17/14   PEG PLACEMENT N/A 08/17/2014   Procedure: PERCUTANEOUS ENDOSCOPIC GASTROSTOMY (PEG) PLACEMENT (procedure #1);  Surgeon: MAviva SignsMd, MD;  Location: [facility];  Service: General;  Laterality: N/A;   PORT-A-CATH REMOVAL Right 07/17/2016   Procedure: MINOR REMOVAL PORT-A-CATH;  Surgeon: MAviva Signs  MD;  Location: [facility];  Service: General;  Laterality: Right;   PORTACATH PLACEMENT Right 08/17/14   PORTACATH PLACEMENT Right 08/17/2014   Procedure: INSERTION PORT-A-CATH (procedure #2);  Surgeon: Aviva Signs Md, MD;  Location: [facility];  Service: General;  Laterality: Right;   PORTACATH PLACEMENT Left 04/26/2018   Procedure: INSERTION PORT-A-CATH (attached catheter in left subclavian);  Surgeon: Aviva Signs, MD;  Location: [facility];  Service: General;  Laterality: Left;   SAVORY DILATION N/A 01/15/2018   Procedure: SAVORY DILATION;  Surgeon: Alexander Binder, MD;   [facility];  Service: Endoscopy;  Laterality: N/A;   VIDEO BRONCHOSCOPY WITH ENDOBRONCHIAL ULTRASOUND N/A 04/15/2018   Procedure: VIDEO BRONCHOSCOPY WITH ENDOBRONCHIAL ULTRASOUND;  Surgeon: Melrose Nakayama, MD;  Location: [facility] OR;  Service: Thoracic;  Laterality: N/A;    SOCIAL HISTORY:  Social History   Socioeconomic History   Marital status: Legally Separated    Spouse name: Not on file   Number of children: 5   Years of education: Not on file   Highest education level: Not on file  Occupational History   Not on file  Tobacco Use   Smoking status: Former    Packs/day: 0.50    Years: 30.00    Pack years: 15.00    Types: Cigarettes    Quit date: 07/22/2014    Years since quitting: 6.3   Smokeless tobacco: Never  Substance and Sexual Activity   Alcohol use: Not Currently    Alcohol/week: 0.0 standard drinks    Comment: None currently (11/09/17); previously 1-2 beers on the weekend   Drug use: No   Sexual activity: Not on file  Other Topics Concern   Not on file  Social History Narrative   Not on file   Social Determinants of Health   Financial Resource Strain: Low Risk    Difficulty of Paying Living Expenses: Not hard at all  Food Insecurity: No Food Insecurity   Worried About Charity fundraiser in the Last Year: Never true   [place] in the Last Year: Never true  Transportation Needs: No Transportation Needs   Lack of Transportation (Medical): No   Lack of Transportation (Non-Medical): No  Physical Activity: Inactive   Days of Exercise per Week: 0 days   Minutes of Exercise per Session: 0 min  Stress: No Stress Concern Present   Feeling of Stress : Not at all  Social Connections: Moderately Isolated   Frequency of Communication with Friends and Family: More than three times a week   Frequency of Social Gatherings with Friends and Family: Twice a week   Attends Religious Services: Never   Marine scientist or Organizations: No    Attends Music therapist: Never   Marital Status: Married  Human resources officer Violence: Not At Risk   Fear of Current or Ex-Partner: No   Emotionally Abused: No   Physically Abused: No   Sexually Abused: No    FAMILY HISTORY:  Family History  Problem Relation Age of Onset   Colon cancer Neg Hx    Gastric cancer Neg Hx    Esophageal cancer Neg Hx     CURRENT MEDICATIONS:  Current Outpatient Medications  Medication Sig Dispense Refill   feeding supplement, ENSURE ENLIVE, (ENSURE ENLIVE) LIQD Take 237 mLs by mouth 4 (four) times daily.      HYDROcodone-acetaminophen (NORCO/VICODIN) 5-325 MG tablet TAKE 1 TABLET EVERY 12 HOURS AS NEEDED FOR MODERATE PAIN 60 tablet 0  levothyroxine (SYNTHROID) 88 MCG tablet Take 1 tablet (88 mcg total) by mouth daily before breakfast. 30 tablet 3   Melatonin 10 MG TABS Take 1 tablet by mouth at bedtime.     naproxen sodium (ALEVE) 220 MG tablet Take 220 mg by mouth daily as needed.      omeprazole (PRILOSEC) 20 MG capsule TAKE 1 CAPSULE BY MOUTH 30 MINUTES PRIOR TO BREAKFAST 90 capsule 0   Pembrolizumab (KEYTRUDA IV) Inject into the vein every 21 ( twenty-one) days.     tamsulosin (FLOMAX) 0.4 MG CAPS capsule TAKE (1) CAPSULE BY MOUTH AT BEDTIME. 30 capsule 6   No current facility-administered medications for this visit.   Facility-Administered Medications Ordered in Other Visits  Medication Dose Route Frequency Provider Last Rate Last Admin   sodium chloride flush (NS) 0.9 % injection 10 mL  10 mL Intracatheter PRN Derek Jack, MD   10 mL at 06/24/19 0925    ALLERGIES:  No Known Allergies  PHYSICAL EXAM:  Performance status (ECOG): 1 - Symptomatic but completely ambulatory  Vitals:   11/30/20 1023  BP: (!) 95/58  Pulse: 70  Resp: 18  Temp: 97.8 F (36.6 C)  SpO2: 98%   Wt Readings from Last 3 Encounters:  11/30/20 129 lb 13.6 oz (58.9 kg)  11/08/20 130 lb 6.4 oz (59.1 kg)  10/18/20 132 lb 3.2 oz (60 kg)    Physical Exam Vitals reviewed.  Constitutional:      Appearance: Normal appearance.  Cardiovascular:     Rate and Rhythm: Normal rate and regular rhythm.     Pulses: Normal pulses.     Heart sounds: Normal heart sounds.  Pulmonary:     Effort: Pulmonary effort is normal.     Breath sounds: Normal breath sounds.  Neurological:     General: No focal deficit present.     Mental Status: He is alert and oriented to person, place, and time.  Psychiatric:        Mood and Affect: Mood normal.        Behavior: Behavior normal.    LABORATORY DATA:  I have reviewed the labs as listed.  CBC Latest Ref Rng & Units 11/30/2020 11/08/2020 10/18/2020  WBC 4.0 - 10.5 K/uL 3.9(L) 4.7 4.1  Hemoglobin 13.0 - 17.0 g/dL 12.6(L) 12.5(L) 12.9(L)  Hematocrit 39.0 - 52.0 % 38.7(L) 38.2(L) 39.3  Platelets 150 - 400 K/uL 176 206 184   CMP Latest Ref Rng & Units 11/30/2020 11/08/2020 10/18/2020  Glucose 70 - 99 mg/dL 96 102(H) 85  BUN 8 - 23 mg/dL 24(H) 32(H) 20  Creatinine 0.61 - 1.24 mg/dL 1.82(H) 1.90(H) 1.57(H)  Sodium 135 - 145 mmol/L 138 139 138  Potassium 3.5 - 5.1 mmol/L 4.6 4.4 4.1  Chloride 98 - 111 mmol/L 105 104 103  CO2 22 - 32 mmol/L 27 28 28   Calcium 8.9 - 10.3 mg/dL 8.6(L) 9.0 8.8(L)  Total Protein 6.5 - 8.1 g/dL 6.7 6.8 6.7  Total Bilirubin 0.3 - 1.2 mg/dL 0.6 0.5 0.4  Alkaline Phos 38 - 126 U/L 48 52 59  AST 15 - 41 U/L 25 21 19   ALT 0 - 44 U/L 21 18 16     DIAGNOSTIC IMAGING:  I have independently reviewed the scans and discussed with the patient. CT CHEST ABDOMEN PELVIS W CONTRAST  Result Date: 11/25/2020 CLINICAL DATA:  Prostate cancer and lung cancer surveillance. Squamous cell carcinoma the LEFT lung. EXAM: CT CHEST, ABDOMEN, AND PELVIS WITH CONTRAST TECHNIQUE: Multidetector CT imaging  of the chest, abdomen and pelvis was performed following the standard protocol during bolus administration of intravenous contrast. CONTRAST:  72m OMNIPAQUE IOHEXOL 350 MG/ML SOLN COMPARISON:  PSMA  PET-CT 10/04/2020, CT 08/27/2020 FINDINGS: CT CHEST FINDINGS Cardiovascular: No significant vascular findings. Normal heart size. No pericardial effusion. Mediastinum/Nodes: Again demonstrated irregular low-density and partially calcified mediastinal lymph nodes unchanged over several comparison exams and mildly hypermetabolic on comparison PET-CT scan. For example subcarinal node measuring 21 mm compared to 21 mm. Lungs/Pleura: Spiculated nodule abutting the pleural surface of the LEFT lower lobe measures 13 mm by 11 mm (image 15/series 4) compared to 9 mm by 8 mm on CT 08/27/2020. Musculoskeletal: No aggressive osseous CT ABDOMEN PELVIS FINDING Hepatobiliary: Multiple benign hepatic cysts. No biliary duct dilatation. Gallbladder normal Pancreas: Pancreas is normal. No ductal dilatation. No pancreatic inflammation. Spleen: Normal spleen Adrenals/urinary tract: Adrenal glands normal. Large round enhancing mass in lower pole of the RIGHT kidney measures 4.4 x 3.9 cm compared to 3.2 by 3.1 cm most recent CT. RIGHT kidney normal.  Bladder normal. Stomach/Bowel: Stomach, small bowel, appendix, and cecum are normal. The colon and rectosigmoid colon are normal. Vascular/Lymphatic: Abdominal aorta normal caliber. Mild intimal calcification. Enlarged lymph node LEFT of the aorta the level the renal veins measures 13 mm (image 80/series 2). Enlarged lymph node along the RIGHT external iliac vessels measures 14 mm (image 109 ) increased from 9 mm on prior. This node had radiotracer activity on PSMA PET scan. Reproductive: Irregular thickening of the posterior aspect of the prostate gland similar prior Other: No free fluid. Musculoskeletal: No aggressive osseous lesion. IMPRESSION: Chest Impression: 1. Enlarging LEFT lobe pulmonary nodule consistent with metastatic disease. Lesion was not active on PSMA PET scan imaging therefore concern for squamous cell carcinoma given history. 2. [name] enlarged low-density partially  calcified mediastinal lymph nodes. Abdomen / Pelvis Impression: 1. Interval increase in size of LEFT renal mass. Rapid increase in size is concerning for renal metastasis. 2. Increase in size of periaortic lymph node concerning for metastatic node. 3. Increase in size of RIGHT iliac node which has PSMA activity consistent prostate cancer metastasis. 4. Irregular thickening of the prostate gland and RIGHT seminal vesicle consistent with prostate carcinoma as seen on PSMA PET-CT scan. Electronically Signed   By: SSuzy BouchardM.D.   On: 11/25/2020 11:05     ASSESSMENT:  1.  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 -6 cycles of carboplatin, paclitaxel and pembrolizumab from 05/03/2018 through 08/21/2018. -Maintenance pembrolizumab started on 09/11/2018. -PET scan on 09/15/2019 showed interval decrease in hypermetabolic areas associated with tongue and floor of the mouth.  Hypermetabolic metastatic lymphadenopathy in the chest is [name].  No new sites seen. -PET scan on 03/15/2020 shows persistent, [name] hypermetabolism in the tongue/floor of mouth.  Slight interval decrease in hypermetabolism with mediastinal/hilar adenopathy.  Persistent hypermetabolic focus in the right supraclavicular region.  New focus of hypermetabolic them identified in the right external iliac chain of pelvis with no discernible adenopathy on the CT.   2.  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   PLAN:  1.  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 - Reviewed CT CAP from 11/24/2020.  Left lower lobe lung nodule measures 1.3 x 1.1 cm, previously 1 cm x  0.9 cm on CT on 08/27/2020.  This lesion was negative on PSMA PET scan.  [name] mediastinal lymph nodes.   Interval enlargement of the left kidney mass and increase in size of the periaortic lymph node. - Recommend biopsy of the left kidney mass to see if it is coming from lung cancer.  Until then we will continue Keytruda.  Reviewed labs today which shows normal LFTs and a near normal CBC.  RTC 3 weeks for follow-up.   2.  Hypothyroidism: - TSH today is 3.6.  Continue Synthroid 88 mcg daily.   3.  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   4.  Bilateral knee pains: - Continue hydrocodone twice daily as needed.   5.  Nutrition: - He usually loses some weight during summertime.  Continue 2 to 4 cans of boost per day.   6.  CKD: -Baseline creatinine between 1.5-1.8.  Today creatinine is 1.82.   7.  Prostate cancer: - Last PSA was 53 on 09/16/2020. - PSMA PET scan on 10/04/2020 showed intense activity throughout the enlarged prostate gland with concern for local extension into the left seminal vesicle.  Small hypermetabolic obturator lymph nodes bilaterally consistent with local nodal metastasis.  No evidence of visceral or skeletal metastasis.  No PSMA activity of the left lower lobe lung nodule or left kidney mass. - He has an appointment to see Dr. Alyson Ingles on 12/03/2020.  8.  Left kidney mass: - This was initially noted on CT CAP on 08/27/2020. - CT scan on 11/24/2020 showed mass measuring 4.4 x 3.9 cm, increased from 3.2 with 3.1 cm from June.  Rapid increase in size is concerning for metastatic disease. - I have recommended IR guided left kidney mass biopsy.   Orders placed this encounter:  No orders of the defined types were placed in this encounter.    Derek Jack, MD [place] [phone]   I, Thana Ates, am acting as a scribe for Dr. Derek Jack.  I, Derek Jack MD, have reviewed the above documentation for accuracy and completeness, and I agree with the  above.

## 2020-11-30 ENCOUNTER — Other Ambulatory Visit: Payer: Self-pay

## 2020-11-30 ENCOUNTER — Inpatient Hospital Stay (HOSPITAL_COMMUNITY): Payer: Medicare Other | Attending: Hematology | Admitting: Hematology

## 2020-11-30 ENCOUNTER — Encounter (HOSPITAL_COMMUNITY): Payer: Self-pay | Admitting: Hematology

## 2020-11-30 ENCOUNTER — Inpatient Hospital Stay (HOSPITAL_COMMUNITY): Payer: Medicare Other

## 2020-11-30 ENCOUNTER — Encounter (HOSPITAL_COMMUNITY): Payer: Self-pay | Admitting: Radiology

## 2020-11-30 VITALS — BP 95/58 | HR 70 | Temp 97.8°F | Resp 18 | Wt 129.9 lb

## 2020-11-30 VITALS — BP 124/77 | HR 70 | Temp 97.0°F

## 2020-11-30 DIAGNOSIS — E039 Hypothyroidism, unspecified: Secondary | ICD-10-CM | POA: Insufficient documentation

## 2020-11-30 DIAGNOSIS — M25562 Pain in left knee: Secondary | ICD-10-CM | POA: Diagnosis not present

## 2020-11-30 DIAGNOSIS — Z79899 Other long term (current) drug therapy: Secondary | ICD-10-CM | POA: Insufficient documentation

## 2020-11-30 DIAGNOSIS — Z923 Personal history of irradiation: Secondary | ICD-10-CM | POA: Diagnosis not present

## 2020-11-30 DIAGNOSIS — C61 Malignant neoplasm of prostate: Secondary | ICD-10-CM | POA: Diagnosis not present

## 2020-11-30 DIAGNOSIS — Z87891 Personal history of nicotine dependence: Secondary | ICD-10-CM | POA: Insufficient documentation

## 2020-11-30 DIAGNOSIS — R972 Elevated prostate specific antigen [PSA]: Secondary | ICD-10-CM

## 2020-11-30 DIAGNOSIS — C3492 Malignant neoplasm of unspecified part of left bronchus or lung: Secondary | ICD-10-CM

## 2020-11-30 DIAGNOSIS — N189 Chronic kidney disease, unspecified: Secondary | ICD-10-CM | POA: Diagnosis not present

## 2020-11-30 DIAGNOSIS — Z85810 Personal history of malignant neoplasm of tongue: Secondary | ICD-10-CM | POA: Diagnosis not present

## 2020-11-30 DIAGNOSIS — Z9221 Personal history of antineoplastic chemotherapy: Secondary | ICD-10-CM | POA: Diagnosis not present

## 2020-11-30 DIAGNOSIS — M25561 Pain in right knee: Secondary | ICD-10-CM | POA: Diagnosis not present

## 2020-11-30 DIAGNOSIS — C109 Malignant neoplasm of oropharynx, unspecified: Secondary | ICD-10-CM

## 2020-11-30 DIAGNOSIS — Z5112 Encounter for antineoplastic immunotherapy: Secondary | ICD-10-CM | POA: Diagnosis present

## 2020-11-30 DIAGNOSIS — N2889 Other specified disorders of kidney and ureter: Secondary | ICD-10-CM | POA: Insufficient documentation

## 2020-11-30 LAB — COMPREHENSIVE METABOLIC PANEL
ALT: 21 U/L (ref 0–44)
AST: 25 U/L (ref 15–41)
Albumin: 3.7 g/dL (ref 3.5–5.0)
Alkaline Phosphatase: 48 U/L (ref 38–126)
Anion gap: 6 (ref 5–15)
BUN: 24 mg/dL — ABNORMAL HIGH (ref 8–23)
CO2: 27 mmol/L (ref 22–32)
Calcium: 8.6 mg/dL — ABNORMAL LOW (ref 8.9–10.3)
Chloride: 105 mmol/L (ref 98–111)
Creatinine, Ser: 1.82 mg/dL — ABNORMAL HIGH (ref 0.61–1.24)
GFR, Estimated: 39 mL/min — ABNORMAL LOW (ref >60)
Glucose, Bld: 96 mg/dL (ref 70–99)
Potassium: 4.6 mmol/L (ref 3.5–5.1)
Sodium: 138 mmol/L (ref 135–145)
Total Bilirubin: 0.6 mg/dL (ref 0.3–1.2)
Total Protein: 6.7 g/dL (ref 6.5–8.1)

## 2020-11-30 LAB — TSH: TSH: 3.695 u[IU]/mL (ref 0.350–4.500)

## 2020-11-30 LAB — CBC WITH DIFFERENTIAL/PLATELET
Abs Immature Granulocytes: 0.01 10*3/uL (ref 0.00–0.07)
Basophils Absolute: 0 10*3/uL (ref 0.0–0.1)
Basophils Relative: 1 %
Eosinophils Absolute: 0.1 10*3/uL (ref 0.0–0.5)
Eosinophils Relative: 2 %
HCT: 38.7 % — ABNORMAL LOW (ref 39.0–52.0)
Hemoglobin: 12.6 g/dL — ABNORMAL LOW (ref 13.0–17.0)
Immature Granulocytes: 0 %
Lymphocytes Relative: 22 %
Lymphs Abs: 0.9 10*3/uL (ref 0.7–4.0)
MCH: 30.7 pg (ref 26.0–34.0)
MCHC: 32.6 g/dL (ref 30.0–36.0)
MCV: 94.2 fL (ref 80.0–100.0)
Monocytes Absolute: 0.4 10*3/uL (ref 0.1–1.0)
Monocytes Relative: 11 %
Neutro Abs: 2.5 10*3/uL (ref 1.7–7.7)
Neutrophils Relative %: 64 %
Platelets: 176 10*3/uL (ref 150–400)
RBC: 4.11 MIL/uL — ABNORMAL LOW (ref 4.22–5.81)
RDW: 15.9 % — ABNORMAL HIGH (ref 11.5–15.5)
WBC: 3.9 10*3/uL — ABNORMAL LOW (ref 4.0–10.5)
nRBC: 0 % (ref 0.0–0.2)

## 2020-11-30 MED ORDER — SODIUM CHLORIDE 0.9 % IV SOLN
200.0000 mg | Freq: Once | INTRAVENOUS | Status: AC
  Administered 2020-11-30: 200 mg via INTRAVENOUS
  Filled 2020-11-30: qty 8

## 2020-11-30 MED ORDER — SODIUM CHLORIDE 0.9 % IV SOLN: Freq: Once | INTRAVENOUS | Status: AC

## 2020-11-30 MED ORDER — HEPARIN SOD (PORK) LOCK FLUSH 100 UNIT/ML IV SOLN
500.0000 [IU] | Freq: Once | INTRAVENOUS | Status: AC | PRN
  Administered 2020-11-30: 500 [IU]

## 2020-11-30 MED ORDER — SODIUM CHLORIDE 0.9% FLUSH
10.0000 mL | INTRAVENOUS | Status: DC | PRN
  Administered 2020-11-30: 10 mL

## 2020-11-30 NOTE — Patient Instructions (Signed)
[facility]  Discharge Instructions: Thank you for choosing [facility] to provide your oncology and hematology care.  If you have a lab appointment with the [facility], please come in thru the Main Entrance and check in at the main information desk.  Wear comfortable clothing and clothing appropriate for easy access to any Portacath or PICC line.   We strive to give you quality time with your provider. You may need to reschedule your appointment if you arrive late (15 or more minutes).  Arriving late affects you and other patients whose appointments are after yours.  Also, if you miss three or more appointments without notifying the office, you may be dismissed from the clinic at the provider's discretion.      For prescription refill requests, have your pharmacy contact our office and allow 72 hours for refills to be completed.    Today you received the following chemotherapy and/or immunotherapy agents Keytruda      To help prevent nausea and vomiting after your treatment, we encourage you to take your nausea medication as directed.  BELOW ARE SYMPTOMS THAT SHOULD BE REPORTED IMMEDIATELY: *FEVER GREATER THAN 100.4 F (38 C) OR HIGHER *CHILLS OR SWEATING *NAUSEA AND VOMITING THAT IS NOT CONTROLLED WITH YOUR NAUSEA MEDICATION *UNUSUAL SHORTNESS OF BREATH *UNUSUAL BRUISING OR BLEEDING *URINARY PROBLEMS (pain or burning when urinating, or frequent urination) *BOWEL PROBLEMS (unusual diarrhea, constipation, pain near the anus) TENDERNESS IN MOUTH AND THROAT WITH OR WITHOUT PRESENCE OF ULCERS (sore throat, sores in mouth, or a toothache) UNUSUAL RASH, SWELLING OR PAIN  UNUSUAL VAGINAL DISCHARGE OR ITCHING   Items with * indicate a potential emergency and should be followed up as soon as possible or go to the Emergency Department if any problems should occur.  Please show the CHEMOTHERAPY ALERT CARD or IMMUNOTHERAPY ALERT CARD at check-in to the Emergency  Department and triage nurse.  Should you have questions after your visit or need to cancel or reschedule your appointment, please contact [facility] [phone]  and follow the prompts.  Office hours are 8:00 a.m. to 4:30 p.m. Monday - Friday. Please note that voicemails left after 4:00 p.m. may not be returned until the following business day.  We are closed weekends and major holidays. You have access to a nurse at all times for urgent questions. Please call the main number to the clinic [phone] and follow the prompts.  For any non-urgent questions, you may also contact your provider using MyChart. We now offer e-Visits for anyone 31 and older to request care online for non-urgent symptoms. For details visit [email or web address].   Also download the MyChart app! Go to the app store, search "MyChart", open the app, select [facility], and log in with your MyChart username and password.  Due to Covid, a mask is required upon entering the hospital/clinic. If you do not have a mask, one will be given to you upon arrival. For doctor visits, patients may have 1 support person aged 64 or older with them. For treatment visits, patients cannot have anyone with them due to current Covid guidelines and our immunocompromised population.

## 2020-11-30 NOTE — Patient Instructions (Addendum)
[place] at [facility] Discharge Instructions  You were seen today by Dr. Delton Coombes. He went over your recent results and scans, and you received your treatment. You will be scheduled for a biopsy of your left kidney prior to your next visit. Dr. Delton Coombes will see you back in 3 weeks for labs and follow up.   Thank you for choosing [place] at [facility] to provide your oncology and hematology care.  To afford each patient quality time with our provider, please arrive at least 15 minutes before your scheduled appointment time.   If you have a lab appointment with the [facility] please come in thru the Main Entrance and check in at the main information desk  You need to re-schedule your appointment should you arrive 10 or more minutes late.  We strive to give you quality time with our providers, and arriving late affects you and other patients whose appointments are after yours.  Also, if you no show three or more times for appointments you may be dismissed from the clinic at the providers discretion.     Again, thank you for choosing [facility].  Our hope is that these requests will decrease the amount of time that you wait before being seen by our physicians.       _____________________________________________________________  Should you have questions after your visit to [facility], please contact our office at [phone] between the hours of 8:00 a.m. and 4:30 p.m.  Voicemails left after 4:00 p.m. will not be returned until the following business day.  For prescription refill requests, have your pharmacy contact our office and allow 72 hours.    Cancer Center Support Programs:   > Cancer Support Group  2nd Tuesday of the month 1pm-2pm, Journey Room

## 2020-11-30 NOTE — Progress Notes (Signed)
Patient Name  Alexander [name], Alexander [name] Legal Sex  Male DOB  24-May-1950 SSN  [id number] Address  [address]  [place] [address] Phone  [phone] (Home)    RE: CT Biopsy Received: Today Arne Cleveland, MD  Jillyn [place]   [facility] [employer] renal mass  Prob met  Could prob be done in [place] too, operator preference   DDH        Previous Messages   ----- Message -----  From: Garth Bigness D  Sent: 11/30/2020   2:01 PM EDT  To: [facility] Procedure Requests  Subject: CT Biopsy                                       Procedure:  CT Biopsy   Reason:  Renal mass, left   History:  NM PET, NM Bone, CT in computer   Provider:  Derek Jack   Provider Contact:  [phone]

## 2020-11-30 NOTE — Progress Notes (Signed)
Patient presents today for Keytruda infusion per providers order.  Vital sign within parameters for treatment.  Labs pending.  Patient has no new complaints at this time.  Keytrida infusion given today per MD orders.  Stable during infusion without adverse affects.  Vital signs stable.  No complaints at this time.  Discharge from clinic ambulatory in stable condition.  Alert and oriented X 3.  Follow up with [facility] as scheduled.

## 2020-11-30 NOTE — Progress Notes (Signed)
Patient has been examined, vital signs and labs have been reviewed by Dr. Delton Coombes. ANC, Creatinine, LFTs, hemoglobin, and platelets are within treatment parameters per Dr. Delton Coombes. Patient is okay to proceed with treatment per M.D.

## 2020-12-04 ENCOUNTER — Other Ambulatory Visit (HOSPITAL_COMMUNITY): Payer: Self-pay | Admitting: Hematology

## 2020-12-04 DIAGNOSIS — C3492 Malignant neoplasm of unspecified part of left bronchus or lung: Secondary | ICD-10-CM

## 2020-12-06 ENCOUNTER — Encounter (HOSPITAL_COMMUNITY): Payer: Self-pay | Admitting: Internal Medicine

## 2020-12-06 ENCOUNTER — Other Ambulatory Visit (HOSPITAL_COMMUNITY): Payer: Self-pay | Admitting: *Deleted

## 2020-12-06 ENCOUNTER — Encounter (HOSPITAL_COMMUNITY): Payer: Self-pay | Admitting: *Deleted

## 2020-12-06 ENCOUNTER — Other Ambulatory Visit (HOSPITAL_COMMUNITY): Payer: Self-pay

## 2020-12-06 DIAGNOSIS — E039 Hypothyroidism, unspecified: Secondary | ICD-10-CM

## 2020-12-06 DIAGNOSIS — C3492 Malignant neoplasm of unspecified part of left bronchus or lung: Secondary | ICD-10-CM

## 2020-12-06 MED ORDER — HYDROCODONE-ACETAMINOPHEN 5-325 MG PO TABS
ORAL_TABLET | ORAL | 0 refills | Status: DC
Start: 2020-12-06 — End: 2021-01-03

## 2020-12-06 MED ORDER — LEVOTHYROXINE SODIUM 88 MCG PO TABS
88.0000 ug | ORAL_TABLET | Freq: Every day | ORAL | 3 refills | Status: DC
Start: 2020-12-06 — End: 2021-04-21

## 2020-12-15 ENCOUNTER — Other Ambulatory Visit: Payer: Self-pay

## 2020-12-15 ENCOUNTER — Encounter: Payer: Self-pay | Admitting: Urology

## 2020-12-15 ENCOUNTER — Ambulatory Visit (INDEPENDENT_AMBULATORY_CARE_PROVIDER_SITE_OTHER): Payer: Medicare Other | Admitting: Urology

## 2020-12-15 VITALS — BP 112/74 | HR 73 | Ht 73.0 in | Wt 127.2 lb

## 2020-12-15 DIAGNOSIS — C61 Malignant neoplasm of prostate: Secondary | ICD-10-CM | POA: Diagnosis not present

## 2020-12-15 DIAGNOSIS — R351 Nocturia: Secondary | ICD-10-CM | POA: Diagnosis not present

## 2020-12-15 DIAGNOSIS — N2889 Other specified disorders of kidney and ureter: Secondary | ICD-10-CM

## 2020-12-15 LAB — URINALYSIS, ROUTINE W REFLEX MICROSCOPIC
Bilirubin, UA: NEGATIVE
Glucose, UA: NEGATIVE
Ketones, UA: NEGATIVE
Nitrite, UA: NEGATIVE
Protein,UA: NEGATIVE
RBC, UA: NEGATIVE
Specific Gravity, UA: 1.02 (ref 1.005–1.030)
Urobilinogen, Ur: 0.2 mg/dL (ref 0.2–1.0)
pH, UA: 5.5 (ref 5.0–7.5)

## 2020-12-15 LAB — MICROSCOPIC EXAMINATION
Bacteria, UA: NONE SEEN
RBC, Urine: NONE SEEN /hpf (ref 0–2)

## 2020-12-15 MED ORDER — SILODOSIN 8 MG PO CAPS: 8.0000 mg | ORAL_CAPSULE | Freq: Every day | ORAL | 11 refills | Status: DC

## 2020-12-15 NOTE — Progress Notes (Signed)
Urological Symptom Review  Patient is experiencing the following symptoms: Frequent urination Hard to postpone urination Get up at night to urinate Stream starts and stops Weak stream Erection problems (male only)   Review of Systems  Gastrointestinal (upper)  : Negative for upper GI symptoms  Gastrointestinal (lower) : Negative for lower GI symptoms  Constitutional : Weight loss  Skin: Negative for skin symptoms  Eyes: Negative for eye symptoms  Ear/Nose/Throat : Negative for Ear/Nose/Throat symptoms  Hematologic/Lymphatic: Negative for Hematologic/Lymphatic symptoms  Cardiovascular : Negative for cardiovascular symptoms  Respiratory : Negative for respiratory symptoms  Endocrine: Negative for endocrine symptoms  Musculoskeletal: Joint pain  Neurological: Negative for neurological symptoms  Psychologic: Negative for psychiatric symptoms

## 2020-12-15 NOTE — Patient Instructions (Signed)
Prostate Cancer The prostate is a small gland that produces fluid that makes up semen (seminal fluid). It is located below the bladder in men, in front of the rectum. Prostate cancer is the abnormal growth of cells in the prostate gland. What are the causes? The exact cause of this condition is not known. What increases the risk? You are more likely to develop this condition if: You are 71 years of age or older. You have a family history of prostate cancer. You have a family history of breast and ovarian cancer. You have genes that are passed from parent to child (inherited), such as BRCA1 and BRCA2. You have Lynch syndrome. African American men and men of African descent are diagnosed with prostate cancer at higher rates than other men. The reasons for this are not well understood and are likely due to a combination of genetic and environmental factors. What are the signs or symptoms? Symptoms of this condition include: Problems with urination. This may include: A weak or interrupted flow of urine. Trouble starting or stopping urination. Trouble emptying the bladder all the way. The need to urinate more often, especially at night. Blood in urine or semen. Persistent pain or discomfort in the lower back, lower abdomen, or hips. Trouble getting an erection. Weakness or numbness in the legs or feet. How is this diagnosed? This condition can be diagnosed with: A digital rectal exam. For this exam, a health care provider inserts a gloved finger into the rectum to feel the prostate gland. A blood test called a prostate-specific antigen (PSA) test. A procedure in which a sample of tissue is taken from the prostate and checked under a microscope (prostate biopsy). An imaging test called transrectal ultrasonography. Once the condition is diagnosed, tests will be done to determine how far the cancer has spread. This is called staging the cancer. Staging may involve imaging tests, such as a bone  scan, CT scan, PET scan, or MRI. Stages of prostate cancer The stages of prostate cancer are as follows: Stage 1 (I). At this stage, the cancer is found in the prostate only. The cancer is not visible on imaging tests, and it is usually found by accident, such as during prostate surgery. Stage 2 (II). At this stage, the cancer is more advanced than it is in stage 1, but the cancer has not spread outside the prostate. Stage 3 (III). At this stage, the cancer has spread beyond the outer layer of the prostate to nearby tissues. The cancer may be found in the seminal vesicles, which are near the bladder and the prostate. Stage 4 (IV). At this stage, the cancer has spread to other parts of the body, such as the lymph nodes, bones, bladder, rectum, liver, or lungs. Prostate cancer grading Prostate cancer is also graded according to how the cancer cells look under a microscope. This is called the Gleason score and the total score can range from 6-10, indicating how likely it is that the cancer will spread (metastasize) to other parts of the body. The higher the score, the greater the likelihood that the cancer will spread. Gleason 6 or lower: This indicates that the cancer cells look similar to normal prostate cells (well differentiated). Gleason 7: This indicates that the cancer cells look somewhat similar to normal prostate cells (moderately differentiated). Gleason 8, 9, or 10: This indicates that the cancer cells look very different than normal prostate cells (poorly differentiated). How is this treated? Treatment for this condition depends on several factors,  including the stage of the cancer, your age, personal preferences, and your overall health. Talk with your health care provider about treatment options that are recommended for you. Common treatments include: Observation for early stage prostate cancer (active surveillance). This involves having exams, blood tests, and in some cases, more biopsies.  For some men, this is the only treatment needed. Surgery. Types of surgeries include: Open surgery (radical prostatectomy). In this surgery, a larger incision is made to remove the prostate. A laparoscopic radical prostatectomy. This is a surgery to remove the prostate and lymph nodes through several small incisions. It is often referred to as a minimally invasive surgery. A robotic radical prostatectomy. This is laparoscopic surgery to remove the prostate and lymph nodes with the help of robotic arms that are controlled by the surgeon. Cryoablation. This is surgery to freeze and destroy cancer cells. Radiation treatment. Types of radiation treatment include: External beam radiation. This type aims beams of radiation from outside the body at the prostate to destroy cancerous cells. Brachytherapy. This type uses radioactive needles, seeds, wires, or tubes that are implanted into the prostate gland. Like external beam radiation, brachytherapy destroys cancerous cells. An advantage is that this type of radiation limits the damage to surrounding tissue and has fewer side effects. Chemotherapy. This treatment kills cancer cells or stops them from multiplying. It kills both cancer cells and normal cells. Targeted therapy. This treatment uses medicines to kill cancer cells without damaging normal cells. Hormone treatment. This treatment involves taking medicines that act on testosterone, one of the male hormones, by: Stopping your body from producing testosterone. Blocking testosterone from reaching cancer cells. Follow these instructions at home: Lifestyle Do not use any products that contain nicotine or tobacco. These products include cigarettes, chewing tobacco, and vaping devices, such as e-cigarettes. If you need help quitting, ask your health care provider. Eat a healthy diet. To do this: Eat foods that are high in fiber. These include beans, whole grains, and fresh fruits and vegetables. Limit  foods that are high in fat and sugar. These include fried or sweet foods. Treatment for prostate cancer may affect sexual function. If you have a partner, continue to have intimate moments. This may include touching, holding, hugging, and caressing your partner. Get plenty of sleep. Consider joining a support group for men who have prostate cancer. Meeting with a support group may help you learn to manage the stress of having cancer. General instructions Take over-the-counter and prescription medicines only as told by your health care provider. If you have to go to the hospital, notify your cancer specialist (oncologist). Keep all follow-up visits. This is important. Where to find more information American Cancer Society: www.cancer.org American Society of Clinical Oncology: www.cancer.net National Cancer Institute: www.cancer.gov Contact a health care provider if: You have new or increasing trouble urinating. You have new or increasing blood in your urine. You have new or increasing pain in your hips, back, or chest. Get help right away if: You have weakness or numbness in your legs. You cannot control urination or your bowel movements (incontinence). You have chills or a fever. Summary The prostate is a small gland that is involved in the production of semen. It is located below a man's bladder, in front of the rectum. Prostate cancer is the abnormal growth of cells in the prostate gland. Treatment for this condition depends on the stage of the cancer, your age, personal preferences, and your overall health. Talk with your health care provider about treatment   options that are recommended for you. Consider joining a support group for men who have prostate cancer. Meeting with a support group may help you learn to manage the stress of having cancer. This information is not intended to replace advice given to you by your health care provider. Make sure you discuss any questions you have with  your health care provider. Document Revised: 06/09/2020 Document Reviewed: 06/09/2020 Elsevier Patient Education  2022 Elsevier Inc.  

## 2020-12-17 ENCOUNTER — Other Ambulatory Visit: Payer: Self-pay

## 2020-12-20 ENCOUNTER — Other Ambulatory Visit: Payer: Self-pay | Admitting: Radiology

## 2020-12-21 ENCOUNTER — Other Ambulatory Visit: Payer: Self-pay | Admitting: Radiology

## 2020-12-21 ENCOUNTER — Ambulatory Visit (HOSPITAL_COMMUNITY): Payer: Medicare Other

## 2020-12-21 ENCOUNTER — Other Ambulatory Visit (HOSPITAL_COMMUNITY): Payer: Medicare Other

## 2020-12-21 ENCOUNTER — Ambulatory Visit (HOSPITAL_COMMUNITY): Payer: Medicare Other | Admitting: Hematology

## 2020-12-22 ENCOUNTER — Ambulatory Visit (HOSPITAL_COMMUNITY): Payer: Medicare Other

## 2020-12-24 ENCOUNTER — Other Ambulatory Visit: Payer: Self-pay | Admitting: Nurse Practitioner

## 2020-12-28 NOTE — Progress Notes (Signed)
12/15/2020 7:58 AM   Alexander [name] 06-22-1950 [id number]  Referring provider: Derek Jack, MD [address] [place],  [place] [address]  Prostate cancer and renal mass   HPI: Alexander [name] is a 70yo here for evaluation of prosatte cancer and a renal mass. 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 He underwent surveillance CT 6/3 which showed a new right renal mass and multiple enlarged pelvic lymph nodes. PSA was obtained which was 53.8. PSMA PET was obtained which showed uptake in the iliac nodes and prostate consistent with metastatic prostate cancer. Currently he has moderate to severe LUTS on no BPH therapy. IPSS 25, QOL 5. He also has a new right renal mass that is lobulated and abnormal in appearance for RCC. No gross hematuria.    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 3 weeks ago.- Dr. Oneal Deputy [facility] [place], [place].   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   SCCa of Left BOT    Surgical History: Past Surgical History:  Procedure Laterality Date   BIOPSY  01/15/2018   Procedure: BIOPSY;  Surgeon: Danie Binder, MD;  [facility];  Service: Endoscopy;;  gastric   COLONOSCOPY N/A 03/13/2016   Procedure: COLONOSCOPY;  Surgeon: Danie Binder, MD;  [facility];  Service: Endoscopy;  Laterality: N/A;  2:15 PM   ESOPHAGOGASTRODUODENOSCOPY (EGD) WITH PROPOFOL N/A 08/17/2014   Procedure: ESOPHAGOGASTRODUODENOSCOPY (EGD) WITH PROPOFOL (procedure #1);  Surgeon: Aviva Signs Md, MD;  Location: [facility];  Service: General;  Laterality: N/A;   ESOPHAGOGASTRODUODENOSCOPY (EGD) WITH PROPOFOL N/A 01/15/2018   Procedure: ESOPHAGOGASTRODUODENOSCOPY (EGD) WITH PROPOFOL;  Surgeon: Danie Binder, MD;  Location: [facility];  Service:  Endoscopy;  Laterality: N/A;  9:30am   MULTIPLE EXTRACTIONS WITH ALVEOLOPLASTY N/A 08/12/2014   Procedure: Extraction of tooth #'s 6,17,22,23,24,25,26,27 with alveoloplasty;  Surgeon: Lenn Cal, DDS;  Location: [facility];  Service: Oral Surgery;  Laterality: N/A;   PANENDOSCOPY N/A 08/06/2014   Procedure: PANENDOSCOPY WITH BIOPSY;  Surgeon: Leta Baptist, MD;  Location: [facility];  Service: ENT;  Laterality: N/A;   PEG PLACEMENT Left 08/17/14   PEG PLACEMENT N/A 08/17/2014   Procedure: PERCUTANEOUS ENDOSCOPIC GASTROSTOMY (PEG) PLACEMENT (procedure #1);  Surgeon: Aviva Signs Md, MD;  Location: [facility];  Service: General;  Laterality: N/A;   PORT-A-CATH REMOVAL Right 07/17/2016   Procedure: MINOR REMOVAL PORT-A-CATH;  Surgeon: Aviva Signs, MD;  Location: [facility];  Service: General;  Laterality: Right;   PORTACATH PLACEMENT Right 08/17/14   PORTACATH PLACEMENT Right 08/17/2014   Procedure: INSERTION PORT-A-CATH (procedure #2);  Surgeon: Aviva Signs Md, MD;  Location: [facility];  Service: General;  Laterality: Right;   PORTACATH PLACEMENT Left 04/26/2018   Procedure: INSERTION PORT-A-CATH (attached catheter in left subclavian);  Surgeon: Aviva Signs, MD;  Location: [facility];  Service: General;  Laterality: Left;   SAVORY DILATION N/A 01/15/2018   Procedure: SAVORY DILATION;  Surgeon: Danie Binder, MD;  [facility];  Service: Endoscopy;  Laterality: N/A;   VIDEO BRONCHOSCOPY WITH ENDOBRONCHIAL ULTRASOUND N/A 04/15/2018   Procedure: VIDEO BRONCHOSCOPY WITH ENDOBRONCHIAL ULTRASOUND;  Surgeon: Melrose Nakayama, MD;  Location: [facility];  Service: Thoracic;  Laterality: N/A;    Home Medications:  Allergies as  of 12/15/2020   No Known Allergies      Medication List        Accurate as of December 15, 2020 11:59 PM. If you have any questions, ask your nurse or doctor.          feeding supplement Liqd Take 237 mLs by mouth 4 (four) times daily.    HYDROcodone-acetaminophen 5-325 MG tablet Commonly known as: NORCO/VICODIN TAKE 1 TABLET EVERY 12 HOURS AS NEEDED FOR MODERATE PAIN   HYDROcodone-acetaminophen 5-325 MG tablet Commonly known as: NORCO/VICODIN TAKE 1 TABLET EVERY 12 HOURS AS NEEDED FOR MODERATE PAIN   KEYTRUDA IV Inject into the vein every 21 ( twenty-one) days.   levothyroxine 88 MCG tablet Commonly known as: Synthroid Take 1 tablet (88 mcg total) by mouth daily before breakfast.   Melatonin 10 MG Tabs Take 1 tablet by mouth at bedtime.   naproxen sodium 220 MG tablet Commonly known as: ALEVE Take 220 mg by mouth daily as needed.   omeprazole 20 MG capsule Commonly known as: PRILOSEC TAKE 1 CAPSULE BY MOUTH 30 MINUTES PRIOR TO BREAKFAST   silodosin 8 MG Caps capsule Commonly known as: RAPAFLO Take 1 capsule (8 mg total) by mouth daily with breakfast. Started by: [name], MD   tamsulosin 0.4 MG Caps capsule Commonly known as: FLOMAX TAKE (1) CAPSULE BY MOUTH AT BEDTIME.        Allergies: No Known Allergies  Family History: Family History  Problem Relation Age of Onset   Colon cancer Neg Hx    Gastric cancer Neg Hx    Esophageal cancer Neg Hx     Social History:  reports that he quit smoking about 6 years ago. His smoking use included cigarettes. He has a 15.00 pack-year smoking history. He has never used smokeless tobacco. He reports that he does not currently use alcohol. He reports that he does not use drugs.  ROS: All other review of systems were reviewed and are negative except what is noted above in HPI  Physical Exam: BP 112/74   Pulse 73   Ht 6\' 1"  (1.854 m)   Wt 127 lb 4 oz (57.7 kg)   BMI 16.79 kg/m   Constitutional:  Alert and oriented, No acute distress. HEENT: [place] AT, moist mucus membranes.  Trachea midline, no masses. Cardiovascular: No clubbing, cyanosis, or edema. Respiratory: Normal respiratory effort, no increased work of breathing. GI: Abdomen is soft,  nontender, nondistended, no abdominal masses GU: No CVA tenderness. Circumcised phallus. No masses/lesions on penis, testis, scrotum. Prostate 40g smooth no nodules no induration.  Lymph: No cervical or inguinal lymphadenopathy. Skin: No rashes, bruises or suspicious lesions. Neurologic: Grossly intact, no focal deficits, moving all 4 extremities. Psychiatric: Normal mood and affect.  Laboratory Data: Lab Results  Component Value Date   WBC 3.9 (L) 11/30/2020   HGB 12.6 (L) 11/30/2020   HCT 38.7 (L) 11/30/2020   MCV 94.2 11/30/2020   PLT 176 11/30/2020    Lab Results  Component Value Date   CREATININE 1.82 (H) 11/30/2020    No results found for: PSA  No results found for: TESTOSTERONE  No results found for: HGBA1C  Urinalysis    Component Value Date/Time   APPEARANCEUR Clear 12/15/2020 1008   GLUCOSEU Negative 12/15/2020 1008   BILIRUBINUR Negative 12/15/2020 1008   PROTEINUR Negative 12/15/2020 1008   NITRITE Negative 12/15/2020 1008   LEUKOCYTESUR Trace (A) 12/15/2020 1008    Lab Results  Component Value Date   LABMICR See  below: 12/15/2020   WBCUA 0-5 12/15/2020   LABEPIT 0-10 12/15/2020   BACTERIA None seen 12/15/2020    Pertinent Imaging: CT 6/3, 9/1, PSMA PET: Images reviewed and discussed with the patient  No results found for this or any previous visit.  No results found for this or any previous visit.  No results found for this or any previous visit.  No results found for this or any previous visit.  No results found for this or any previous visit.  No results found for this or any previous visit.  No results found for this or any previous visit.  No results found for this or any previous visit.   Assessment & Plan:    1. Prostate cancer [facility]) We discussed the management of metastatic prostate cancer and the role for androgen deprivation therapy. We discussed the role of prostate biopsy for a tissue diagnosis and given his hx of metastatic  squamous cell and his current therapy of keytruda we have elected not to proceed with prostate biopsy due to increased risk of infection. He also does not want to pursue ADT. - Urinalysis, Routine w reflex microscopic  2. Kidney mass -We will await renal biopsy results prior to pursuing therapy - Urinalysis, Routine w reflex microscopic  3. Nocturia -We will start rapaflo 8mg  qhs    Return in about 4 weeks (around 01/12/2021) for PVR.  [name], MD  [facility] [place]

## 2021-01-03 ENCOUNTER — Other Ambulatory Visit (HOSPITAL_COMMUNITY): Payer: Self-pay | Admitting: *Deleted

## 2021-01-03 ENCOUNTER — Other Ambulatory Visit: Payer: Self-pay

## 2021-01-03 ENCOUNTER — Encounter (HOSPITAL_COMMUNITY): Payer: Self-pay

## 2021-01-03 ENCOUNTER — Ambulatory Visit (HOSPITAL_COMMUNITY)
Admission: RE | Admit: 2021-01-03 | Discharge: 2021-01-03 | Disposition: A | Discharge status: Home / Self Care | Payer: Medicare Other | Source: Ambulatory Visit | Attending: Hematology | Admitting: Hematology

## 2021-01-03 DIAGNOSIS — Z791 Long term (current) use of non-steroidal anti-inflammatories (NSAID): Secondary | ICD-10-CM | POA: Diagnosis not present

## 2021-01-03 DIAGNOSIS — K219 Gastro-esophageal reflux disease without esophagitis: Secondary | ICD-10-CM | POA: Diagnosis not present

## 2021-01-03 DIAGNOSIS — Z79890 Hormone replacement therapy (postmenopausal): Secondary | ICD-10-CM | POA: Insufficient documentation

## 2021-01-03 DIAGNOSIS — Z79620 Long term (current) use of immunosuppressive biologic: Secondary | ICD-10-CM | POA: Diagnosis not present

## 2021-01-03 DIAGNOSIS — Z79899 Other long term (current) drug therapy: Secondary | ICD-10-CM | POA: Insufficient documentation

## 2021-01-03 DIAGNOSIS — C109 Malignant neoplasm of oropharynx, unspecified: Secondary | ICD-10-CM | POA: Insufficient documentation

## 2021-01-03 DIAGNOSIS — C7902 Secondary malignant neoplasm of left kidney and renal pelvis: Secondary | ICD-10-CM | POA: Insufficient documentation

## 2021-01-03 DIAGNOSIS — Z8546 Personal history of malignant neoplasm of prostate: Secondary | ICD-10-CM | POA: Diagnosis not present

## 2021-01-03 DIAGNOSIS — N2889 Other specified disorders of kidney and ureter: Secondary | ICD-10-CM

## 2021-01-03 DIAGNOSIS — Z85118 Personal history of other malignant neoplasm of bronchus and lung: Secondary | ICD-10-CM | POA: Insufficient documentation

## 2021-01-03 LAB — CBC WITH DIFFERENTIAL/PLATELET
Abs Immature Granulocytes: 0.01 10*3/uL (ref 0.00–0.07)
Basophils Absolute: 0 10*3/uL (ref 0.0–0.1)
Basophils Relative: 0 %
Eosinophils Absolute: 0.1 10*3/uL (ref 0.0–0.5)
Eosinophils Relative: 3 %
HCT: 42.6 % (ref 39.0–52.0)
Hemoglobin: 13.7 g/dL (ref 13.0–17.0)
Immature Granulocytes: 0 %
Lymphocytes Relative: 24 %
Lymphs Abs: 1.2 10*3/uL (ref 0.7–4.0)
MCH: 30.3 pg (ref 26.0–34.0)
MCHC: 32.2 g/dL (ref 30.0–36.0)
MCV: 94.2 fL (ref 80.0–100.0)
Monocytes Absolute: 0.7 10*3/uL (ref 0.1–1.0)
Monocytes Relative: 13 %
Neutro Abs: 3 10*3/uL (ref 1.7–7.7)
Neutrophils Relative %: 60 %
Platelets: 202 10*3/uL (ref 150–400)
RBC: 4.52 MIL/uL (ref 4.22–5.81)
RDW: 14.4 % (ref 11.5–15.5)
WBC: 5 10*3/uL (ref 4.0–10.5)
nRBC: 0 % (ref 0.0–0.2)

## 2021-01-03 LAB — PROTIME-INR
INR: 1 (ref 0.8–1.2)
Prothrombin Time: 13.5 seconds (ref 11.4–15.2)

## 2021-01-03 MED ORDER — FENTANYL CITRATE (PF) 100 MCG/2ML IJ SOLN
INTRAMUSCULAR | Status: AC
  Filled 2021-01-03: qty 4

## 2021-01-03 MED ORDER — MIDAZOLAM HCL 2 MG/2ML IJ SOLN
INTRAMUSCULAR | Status: DC | PRN
  Administered 2021-01-03: 1 mg via INTRAVENOUS

## 2021-01-03 MED ORDER — FENTANYL CITRATE (PF) 100 MCG/2ML IJ SOLN
INTRAMUSCULAR | Status: DC | PRN
  Administered 2021-01-03: 25 ug via INTRAVENOUS
  Administered 2021-01-03: 50 ug via INTRAVENOUS

## 2021-01-03 MED ORDER — GELATIN ABSORBABLE 12-7 MM EX MISC
CUTANEOUS | Status: AC
  Filled 2021-01-03: qty 1

## 2021-01-03 MED ORDER — SODIUM CHLORIDE 0.9 % IV SOLN: INTRAVENOUS | Status: DC

## 2021-01-03 MED ORDER — LIDOCAINE HCL 1 % IJ SOLN
INTRAMUSCULAR | Status: AC
  Filled 2021-01-03: qty 10

## 2021-01-03 MED ORDER — LIDOCAINE-EPINEPHRINE 1 %-1:100000 IJ SOLN
INTRAMUSCULAR | Status: AC
  Filled 2021-01-03: qty 1

## 2021-01-03 MED ORDER — HYDROCODONE-ACETAMINOPHEN 5-325 MG PO TABS: 1.0000 | ORAL_TABLET | Freq: Two times a day (BID) | ORAL | 0 refills | Status: DC | PRN

## 2021-01-03 MED ORDER — MIDAZOLAM HCL 2 MG/2ML IJ SOLN
INTRAMUSCULAR | Status: AC
  Filled 2021-01-03: qty 4

## 2021-01-03 NOTE — Procedures (Signed)
Pre procedural Dx: Left sided renal lesion  Post procedural Dx: Same  Technically successful CT guided biopsy of indeterminate left sided renal lesion   EBL: Trace.   Complications: None immediate.   Ronny Bacon, MD Pager #: [phone]

## 2021-01-03 NOTE — H&P (Addendum)
Chief Complaint: Patient was seen in consultation today for left renal mass biopsy at the request of [place]  Referring Physician(s): Katragadda,Sreedhar  Supervising Physician: Sandi Mariscal  Patient Status: [facility] - Out-pt  History of Present Illness: Alexander [name] is a 70 y.o. male PMH GERD, oropharyngeal squamous cell carcinoma, prostate cancer, lung cancer and left renal mass.  CT on 11/25/2020 showed increasing size of left renal mass.  Patient referred for left renal mass biopsy by Dr. Delton Coombes.  CT chest abdomen pelvis 11/25/2020:  Abdomen / Pelvis Impression:   1. Interval increase in size of LEFT renal mass. Rapid increase in size is concerning for renal metastasis. 2. Increase in size of periaortic lymph node concerning for metastatic node. 3. Increase in size of RIGHT iliac node which has PSMA activity consistent prostate cancer metastasis. 4. Irregular thickening of the prostate gland and RIGHT seminal vesicle consistent with prostate carcinoma as seen on PSMA PET-CT  Past Medical History:  Diagnosis Date  . GERD (gastroesophageal reflux disease)   . 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 Oropharyngeal cancer ([place]) 07/28/2014   dx. 3 weeks ago.- Dr. Oneal [facility] [place], [place].  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 BIOPSY  01/15/2018   Procedure: BIOPSY;  Surgeon: Danie Binder, MD;  [facility];  Service: Endoscopy;;  gastric  . COLONOSCOPY N/A 03/13/2016   Procedure: COLONOSCOPY;  Surgeon: Danie Binder, MD;  [facility];  Service: Endoscopy;  Laterality: N/A;  2:15 PM  . ESOPHAGOGASTRODUODENOSCOPY (EGD) WITH PROPOFOL N/A 08/17/2014   Procedure: ESOPHAGOGASTRODUODENOSCOPY (EGD) WITH PROPOFOL (procedure #1);  Surgeon: Aviva Signs Md, MD;  Location: [facility];  Service:  General;  Laterality: N/A;  . ESOPHAGOGASTRODUODENOSCOPY (EGD) WITH PROPOFOL N/A 01/15/2018   Procedure: ESOPHAGOGASTRODUODENOSCOPY (EGD) WITH PROPOFOL;  Surgeon: Danie Binder, MD;  [facility];  Service: Endoscopy;  Laterality: N/A;  9:30am  . MULTIPLE EXTRACTIONS WITH ALVEOLOPLASTY N/A 08/12/2014   Procedure: Extraction of tooth #'s 6,17,22,23,24,25,26,27 with alveoloplasty;  Surgeon: Lenn Cal, DDS;  Location: [facility];  Service: Oral Surgery;  Laterality: N/A;  . PANENDOSCOPY N/A 08/06/2014   Procedure: PANENDOSCOPY WITH BIOPSY;  Surgeon: Leta Baptist, MD;  Location: [facility];  Service: ENT;  Laterality: N/A;  . PEG PLACEMENT Left 08/17/14  . PEG PLACEMENT N/A 08/17/2014   Procedure: PERCUTANEOUS ENDOSCOPIC GASTROSTOMY (PEG) PLACEMENT (procedure #1);  Surgeon: Aviva Signs Md, MD;  Location: [facility];  Service: General;  Laterality: N/A;  . PORT-A-CATH REMOVAL Right 07/17/2016   Procedure: MINOR REMOVAL PORT-A-CATH;  Surgeon: Aviva Signs, MD;  Location: [facility];  Service: General;  Laterality: Right;  . PORTACATH PLACEMENT Right 08/17/14  . PORTACATH PLACEMENT Right 08/17/2014   Procedure: INSERTION PORT-A-CATH (procedure #2);  Surgeon: Aviva Signs Md, MD;  Location: [facility];  Service: General;  Laterality: Right;  . PORTACATH PLACEMENT Left 04/26/2018   Procedure: INSERTION PORT-A-CATH (attached catheter in left subclavian);  Surgeon: Aviva Signs, MD;  Location: [facility];  Service: General;  Laterality: Left;  . SAVORY DILATION N/A 01/15/2018   Procedure: SAVORY DILATION;  Surgeon: Danie Binder, MD;  [facility];  Service: Endoscopy;  Laterality: N/A;  . VIDEO BRONCHOSCOPY WITH ENDOBRONCHIAL ULTRASOUND N/A 04/15/2018   Procedure: VIDEO BRONCHOSCOPY WITH ENDOBRONCHIAL ULTRASOUND;  Surgeon: Roxan Hockey,  Revonda Standard, MD;  Location: [facility] OR;  Service: Thoracic;  Laterality: N/A;    Allergies: Patient has no known allergies.  Medications: Prior to Admission  medications   Medication Sig Start Date End Date Taking? Authorizing Provider  HYDROcodone-acetaminophen (NORCO/VICODIN) 5-325 MG tablet TAKE 1 TABLET EVERY 12 HOURS AS NEEDED FOR MODERATE PAIN 12/06/20  Yes Derek Jack, MD  levothyroxine (SYNTHROID) 88 MCG tablet Take 1 tablet (88 mcg total) by mouth daily before breakfast. 12/06/20  Yes Derek Jack, MD  Melatonin 10 MG TABS Take 1 tablet by mouth at bedtime.   Yes [provider]  Pembrolizumab (KEYTRUDA IV) Inject into the vein every 21 ( twenty-one) days.   Yes [provider]  silodosin (RAPAFLO) 8 MG CAPS capsule Take 1 capsule (8 mg total) by mouth daily with breakfast. 12/15/20  Yes McKenzie, Candee Furbish, MD  feeding supplement, ENSURE ENLIVE, (ENSURE ENLIVE) LIQD Take 237 mLs by mouth 4 (four) times daily.     [provider]  HYDROcodone-acetaminophen (NORCO/VICODIN) 5-325 MG tablet TAKE 1 TABLET EVERY 12 HOURS AS NEEDED FOR MODERATE PAIN 12/06/20   Derek Jack, MD  naproxen sodium (ALEVE) 220 MG tablet Take 220 mg by mouth daily as needed.     [provider]  omeprazole (PRILOSEC) 20 MG capsule TAKE 1 CAPSULE BY MOUTH 30 MINUTES PRIOR TO BREAKFAST 12/24/20   Mahala Menghini, PA-C  prochlorperazine (COMPAZINE) 10 MG tablet Take 1 tablet (10 mg total) by mouth every 6 (six) hours as needed (Nausea or vomiting). Patient not taking: Reported on 11/13/2018 05/01/18 12/04/18  Higgs, Mathis Dad, MD     Family History  Problem Relation Age of Onset  . Colon cancer Neg Hx   . Gastric cancer Neg Hx   . Esophageal cancer Neg Hx     Social History   Socioeconomic History  . Marital status: Legally Separated    Spouse name: Not on file  . Number of children: 5  . Years of education: Not on file  . Highest education level: Not on file  Occupational History  . Not on file  Tobacco Use  . Smoking status: Former    Packs/day: 0.50    Years: 30.00    Pack years: 15.00    Types: Cigarettes     Quit date: 07/22/2014    Years since quitting: 6.4  . Smokeless tobacco: Never  Vaping Use  . Vaping Use: Never used  Substance and Sexual Activity  . Alcohol use: Not Currently    Alcohol/week: 0.0 standard drinks    Comment: None currently (11/09/17); previously 1-2 beers on the weekend  . Drug use: No  . Sexual activity: Not on file  Other Topics Concern  . Not on file  Social History Narrative  . Not on file   Social Determinants of Health   Financial Resource Strain: Low Risk   . Difficulty of Paying Living Expenses: Not hard at all  Food Insecurity: No Food Insecurity  . Worried About Charity fundraiser in the Last Year: Never true  . Ran Out of Food in the Last Year: Never true  Transportation Needs: No Transportation Needs  . Lack of Transportation (Medical): No  . Lack of Transportation (Non-Medical): No  Physical Activity: Inactive  . Days of Exercise per Week: 0 days  . Minutes of Exercise per Session: 0 min  Stress: No Stress Concern Present  . Feeling of Stress : Not at all  Social Connections: Moderately Isolated  .  Frequency of Communication with Friends and Family: More than three times a week  . Frequency of Social Gatherings with Friends and Family: Twice a week  . Attends Religious Services: Never  . Active Member of Clubs or Organizations: No  . Attends Archivist Meetings: Never  . Marital Status: Married    Review of Systems: A 12 point ROS discussed and pertinent positives are indicated in the HPI above.  All other systems are negative.  Review of Systems  All other systems reviewed and are negative.  Vital Signs: BP 109/69   Pulse 65   Temp 97.9 F (36.6 C) (Oral)   Resp 16   Ht 6\' 1"  (1.854 m)   Wt 135 lb (61.2 kg)   SpO2 97%   BMI 17.81 kg/m   Physical Exam Vitals reviewed.  Constitutional:      Appearance: Normal appearance.  HENT:     Head: Normocephalic and atraumatic.     Mouth/Throat:     Mouth: Mucous  membranes are dry.     Pharynx: Oropharynx is clear.  Cardiovascular:     Rate and Rhythm: Normal rate and regular rhythm.     Pulses: Normal pulses.     Heart sounds: No murmur heard.   No gallop.  Pulmonary:     Effort: Pulmonary effort is normal. No respiratory distress.     Breath sounds: No stridor. No wheezing, rhonchi or rales.  Abdominal:     General: Bowel sounds are normal. There is no distension.     Palpations: Abdomen is soft.     Tenderness: There is no abdominal tenderness. There is no guarding.  Musculoskeletal:     Right lower leg: No edema.     Left lower leg: No edema.  Skin:    General: Skin is warm and dry.  Neurological:     Mental Status: He is alert and oriented to person, place, and time.  Psychiatric:        Mood and Affect: Mood normal.        Behavior: Behavior normal.        Thought Content: Thought content normal.        Judgment: Judgment normal.    Imaging: No results found.  Labs:  CBC: Recent Labs    10/18/20 0928 11/08/20 0942 11/30/20 1044 01/03/21 0630  WBC 4.1 4.7 3.9* 5.0  HGB 12.9* 12.5* 12.6* 13.7  HCT 39.3 38.2* 38.7* 42.6  PLT 184 206 176 202    COAGS: Recent Labs    01/03/21 0630  INR 1.0    BMP: Recent Labs    09/16/20 0937 10/18/20 0928 11/08/20 0942 11/30/20 1044  NA 139 138 139 138  K 4.6 4.1 4.4 4.6  CL 105 103 104 105  CO2 27 28 28 27   GLUCOSE 99 85 102* 96  BUN 21 20 32* 24*  CALCIUM 9.0 8.8* 9.0 8.6*  CREATININE 1.75* 1.57* 1.90* 1.82*  GFRNONAA 41* 47* 37* 39*    LIVER FUNCTION TESTS: Recent Labs    09/16/20 0937 10/18/20 0928 11/08/20 0942 11/30/20 1044  BILITOT 0.4 0.4 0.5 0.6  AST 22 19 21 25   ALT 21 16 18 21   ALKPHOS 51 59 52 48  PROT 6.4* 6.7 6.8 6.7  ALBUMIN 3.6 3.6 3.7 3.7    TUMOR MARKERS: No results for input(s): AFPTM, CEA, CA199, CHROMGRNA in the last 8760 hours.  Assessment and Plan: History of GERD, oropharyngeal squamous cell carcinoma, prostate cancer, lung  cancer  and left renal mass.  CT on 11/25/2020 showed increasing size of left renal mass.  Patient referred for left renal mass biopsy by Dr. Delton Coombes. Patient observed to be sitting upright in bed on his cell phone.  He is alert and oriented, calm and pleasant. Patient states he is NPO per order. Patient does not take blood thinners. Labs 01/03/2021: WBC 3.9 Hgb 12.6 INR 1.0 Patient VSS  Risks and benefits of left renal mass biopsy was discussed with the patient and/or patient's family including, but not limited to bleeding, infection, damage to adjacent structures or low yield requiring additional tests.  All of the questions were answered and there is agreement to proceed.  Consent signed and in chart.   Thank you for this interesting consult.  I greatly enjoyed meeting Alexander [name] and look forward to participating in their care.  A copy of this report was sent to the requesting provider on this date.  Electronically Signed: Tyson Alias, NP 01/03/2021, 7:21 AM   I spent a total of 30 minutes in face to face in clinical consultation, greater than 50% of which was counseling/coordinating care for image guided left renal mass biopsy.

## 2021-01-03 NOTE — Progress Notes (Signed)
Pt ambulated without difficulty or bleeding.   Discharged home with [name] who will drive and his friend, Alexander [name], who will stay with pt x 24 hrs.

## 2021-01-06 LAB — SURGICAL PATHOLOGY

## 2021-01-08 LAB — AEROBIC/ANAEROBIC CULTURE W GRAM STAIN (SURGICAL/DEEP WOUND): Culture: NO GROWTH

## 2021-01-08 NOTE — Progress Notes (Signed)
[place], [place] [address]   CLINIC:  Medical Oncology/Hematology  PCP:  Alexander Evens, MD [place]. 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   07/28/2014 Initial Diagnosis   Oropharyngeal cancer   08/03/2014 Imaging   CT CAP- L supraclavicular lymphadenopathy is not completely visualized. This is better seen on the previous neck CT from 07/27/2014. Otherwise, no evidence for metastatic disease in the chest, abdomen, or pelvis.   08/03/2014 Imaging   Bone scan- Uptake at adjacent anterior LEFT 6, 7, 8 ribs likely representing trauma/fractures. Questionable nonspecific increased tracer localization at the posterior RIGHT 8th and 9th ribs, the adjacent nature which raises a a question of trauma as well   08/06/2014 Pathology Results   Dr. Benjamine [name]- Oropharynx, biopsy, Left - INVASIVE SQUAMOUS CELL CARCINOMA.   08/12/2014 Procedure   Dr. Enrique [name]- 1. Multiple extraction of tooth numbers 6, 17, 22, 23, 24, 25, 26, and 27. 3 Quadrants of alveoloplasty   08/17/2014 Pathology Results   PORT and G-TUBE placed by Dr. Carlis [name].   08/26/2014 PET scan   Large hypermetabolic mass in the left base of tongue. Activity extends across midline to the right base tongue. 2. Intensely hypermetabolic left cervical metastatic lymph nodes. Lymph nodes extend from the left level II position to the left supraclavi   09/01/2014 - 09/22/2014 Chemotherapy   Concurrent chemoradiation with Cisplatin 100 mg/m2 x 2 cycles with Neulasta  support. Held cycle #3 d/t renal toxicity.    09/03/2014 - 10/23/2014 Radiation Therapy   Treated in [place], IMRT Alexander [name]).  Base of tongue and bilat neck. Total dose: 70 Gy in 35 fractions. (of note, he did miss several treatments requiring BID dosing towards the end of treatment).    01/25/2015 PET scan   Near complete resolution of metabolic activity at the base of tongue. Minimal residual activity is likely post treatment effect. 2. Complete resolution of metabolic activity above LEFT cervical lymph nodes. No evidence of residual metabolically active    2/77/4128 Procedure   Port-a-cath removed Alexander [name])    05/03/2018 - 08/23/2018 Chemotherapy   The patient had dexamethasone (DECADRON) 4 MG tablet, 8 mg, Oral, Daily, 1 of 1 cycle, Start date: 05/01/2018, End date: 10/23/2018 palonosetron (ALOXI) injection 0.25 mg, 0.25 mg, Intravenous,  Once, 6 of 6 cycles Administration: 0.25 mg (05/03/2018), 0.25 mg (05/24/2018), 0.25 mg (06/14/2018), 0.25 mg (07/09/2018), 0.25 mg (07/30/2018), 0.25 mg (08/21/2018) pegfilgrastim-cbqv (UDENYCA) injection 6 mg, 6 mg, Subcutaneous, Once, 5 of 5 cycles Administration: 6 mg (05/27/2018), 6 mg (06/17/2018), 6 mg (07/11/2018), 6 mg (08/01/2018), 6 mg (08/23/2018) CARBOplatin (PARAPLATIN) 380 mg in sodium chloride 0.9 % 250 mL chemo infusion, 380 mg (100 % of original dose 381 mg), Intravenous,  Once, 6 of 6 cycles Dose modification:   (original dose 381 mg, Cycle 1),   (original dose 309.5 mg, Cycle 2), 307.5 mg (original dose 309.5 mg, Cycle 5) Administration: 380 mg (05/03/2018), 310 mg (05/24/2018), 310 mg (06/14/2018), 340 mg (07/09/2018), 310 mg (07/30/2018), 350 mg (08/21/2018) PACLitaxel (TAXOL) 330 mg in sodium chloride 0.9 %  500 mL chemo infusion (> 24m/m2), 175 mg/m2 = 330 mg (100 % of original dose 175 mg/m2), Intravenous,  Once, 6 of 6 cycles Dose modification: 175 mg/m2 (original dose 175 mg/m2, Cycle 1, Reason: Patient Age) Administration: 330 mg (05/03/2018), 330 mg (05/24/2018),  330 mg (06/14/2018), 330 mg (07/09/2018), 330 mg (07/30/2018), 330 mg (08/21/2018)   for chemotherapy treatment.     05/24/2018 -  Chemotherapy   Patient is on Treatment Plan : HEAD/NECK Pembrolizumab Q21D     Squamous cell lung cancer, left ([facility]  06/14/2018 Initial Diagnosis   Squamous cell lung cancer, left ([facility])     CANCER STAGING: Cancer Staging Oropharyngeal carcinoma ([facility] Staging form: Pharynx - Oropharynx, AJCC 7th Edition - Clinical: Stage IVA (T4a, N2b, M0) - Unsigned   INTERVAL HISTORY:  [name] a 70y.o. male, returns for routine follow-up and consideration for next cycle of immunotherapy. CMarkuswas last seen on 11/30/2020.  Due for cycle #44 of Keytruda today.   Overall, he tells me he has been feeling pretty well. He reports tenderness in his left abdomen following his biopsy; this pain is improving. He eats 2 solid meals a day accompanied by snacks and 1 Boost. His appetite has improved. He denies nausea, tingling/numbness, and fatigue. He reports pink color urine the first day following biopsy, but it resolved the next day.   Overall, he feels ready for next cycle of immunotherapy today.   REVIEW OF SYSTEMS:  Review of Systems  Constitutional:  Negative for appetite change (80%) and fatigue (80%).  Gastrointestinal:  Positive for abdominal pain (L side tender). Negative for nausea.  Musculoskeletal:  Positive for arthralgias (6/10 knees).  Psychiatric/Behavioral:  Positive for depression. The patient is nervous/anxious.   All other systems reviewed and are negative.  PAST MEDICAL/SURGICAL HISTORY:  Past Medical History:  Diagnosis Date   GERD (gastroesophageal reflux disease)    Mass of neck    dx. oropharyngeal squamous cell carcinoma- Chemo. radiation planned   Oropharyngeal cancer ([facility] 07/28/2014   dx. 3 weeks ago.- Dr. TOneal Deputycenter RDenver [name]   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[facility] 08/06/2014   SCCa of Left BOT   Past  Surgical History:  Procedure Laterality Date   BIOPSY  01/15/2018   Procedure: BIOPSY;  Surgeon: Alexander Binder MD;  [facility];  Service: Endoscopy;;  gastric   COLONOSCOPY N/A 03/13/2016   Procedure: COLONOSCOPY;  Surgeon: SDanie Binder MD;  [facility];  Service: Endoscopy;  Laterality: N/A;  2:15 PM   ESOPHAGOGASTRODUODENOSCOPY (EGD) WITH PROPOFOL N/A 08/17/2014   Procedure: ESOPHAGOGASTRODUODENOSCOPY (EGD) WITH PROPOFOL (procedure #1);  Surgeon: MAviva SignsMd, MD;  Location: [facility];  Service: General;  Laterality: N/A;   ESOPHAGOGASTRODUODENOSCOPY (EGD) WITH PROPOFOL N/A 01/15/2018   Procedure: ESOPHAGOGASTRODUODENOSCOPY (EGD) WITH PROPOFOL;  Surgeon: Alexander Binder MD;  [facility];  Service: Endoscopy;  Laterality: N/A;  9:30am   MULTIPLE EXTRACTIONS WITH ALVEOLOPLASTY N/A 08/12/2014   Procedure: Extraction of tooth #'s 6,17,22,23,24,25,26,27 with alveoloplasty;  Surgeon: RLenn Cal DDS;  Location: [facility];  Service: Oral Surgery;  Laterality: N/A;   PANENDOSCOPY N/A 08/06/2014   Procedure: PANENDOSCOPY WITH BIOPSY;  Surgeon: SLeta Baptist MD;  [facility]  Service: ENT;  Laterality: N/A;   PEG PLACEMENT Left 08/17/14   PEG PLACEMENT N/A 08/17/2014   Procedure: PERCUTANEOUS ENDOSCOPIC GASTROSTOMY (PEG) PLACEMENT (procedure #1);  Surgeon: MAviva SignsMd, MD;  Location: [facility];  Service: General;  Laterality: N/A;   PORT-A-CATH REMOVAL Right 07/17/2016   Procedure: MINOR REMOVAL PORT-A-CATH;  Surgeon: Aviva Signs, MD;  Location: [facility];  Service: General;  Laterality: Right;   PORTACATH PLACEMENT Right 08/17/14   PORTACATH PLACEMENT Right 08/17/2014   Procedure: INSERTION PORT-A-CATH (procedure #2);  Surgeon: Aviva Signs Md, MD;  Location: [facility];  Service: General;  Laterality: Right;   PORTACATH PLACEMENT Left 04/26/2018   Procedure: INSERTION PORT-A-CATH (attached catheter in left subclavian);  Surgeon: Aviva Signs,  MD;  Location: [facility];  Service: General;  Laterality: Left;   SAVORY DILATION N/A 01/15/2018   Procedure: SAVORY DILATION;  Surgeon: Alexander Binder, MD;  [facility];  Service: Endoscopy;  Laterality: N/A;   VIDEO BRONCHOSCOPY WITH ENDOBRONCHIAL ULTRASOUND N/A 04/15/2018   Procedure: VIDEO BRONCHOSCOPY WITH ENDOBRONCHIAL ULTRASOUND;  Surgeon: Melrose Nakayama, MD;  Location: [facility] OR;  Service: Thoracic;  Laterality: N/A;    SOCIAL HISTORY:  Social History   Socioeconomic History   Marital status: Legally Separated    Spouse name: Not on file   Number of children: 5   Years of education: Not on file   Highest education level: Not on file  Occupational History   Not on file  Tobacco Use   Smoking status: Former    Packs/day: 0.50    Years: 30.00    Pack years: 15.00    Types: Cigarettes    Quit date: 07/22/2014    Years since quitting: 6.4   Smokeless tobacco: Never  Vaping Use   Vaping Use: Never used  Substance and Sexual Activity   Alcohol use: Not Currently    Alcohol/week: 0.0 standard drinks    Comment: None currently (11/09/17); previously 1-2 beers on the weekend   Drug use: No   Sexual activity: Not on file  Other Topics Concern   Not on file  Social History Narrative   Not on file   Social Determinants of Health   Financial Resource Strain: Low Risk    Difficulty of Paying Living Expenses: Not hard at all  Food Insecurity: No Food Insecurity   Worried About Charity fundraiser in the Last Year: Never true   [place] in the Last Year: Never true  Transportation Needs: No Transportation Needs   Lack of Transportation (Medical): No   Lack of Transportation (Non-Medical): No  Physical Activity: Inactive   Days of Exercise per Week: 0 days   Minutes of Exercise per Session: 0 min  Stress: No Stress Concern Present   Feeling of Stress : Not at all  Social Connections: Moderately Isolated   Frequency of Communication with Friends and  Family: More than three times a week   Frequency of Social Gatherings with Friends and Family: Twice a week   Attends Religious Services: Never   Marine scientist or Organizations: No   Attends Music therapist: Never   Marital Status: Married  Human resources officer Violence: Not At Risk   Fear of Current or Ex-Partner: No   Emotionally Abused: No   Physically Abused: No   Sexually Abused: No    FAMILY HISTORY:  Family History  Problem Relation Age of Onset   Colon cancer Neg Hx    Gastric cancer Neg Hx    Esophageal cancer Neg Hx     CURRENT MEDICATIONS:  Current Outpatient Medications  Medication Sig Dispense Refill   feeding supplement, ENSURE ENLIVE, (ENSURE ENLIVE) LIQD Take  237 mLs by mouth 4 (four) times daily.      HYDROcodone-acetaminophen (NORCO/VICODIN) 5-325 MG tablet Take 1 tablet by mouth every 12 (twelve) hours as needed for moderate pain. 60 tablet 0   levothyroxine (SYNTHROID) 88 MCG tablet Take 1 tablet (88 mcg total) by mouth daily before breakfast. 30 tablet 3   Melatonin 10 MG TABS Take 1 tablet by mouth at bedtime.     naproxen sodium (ALEVE) 220 MG tablet Take 220 mg by mouth daily as needed.      omeprazole (PRILOSEC) 20 MG capsule TAKE 1 CAPSULE BY MOUTH 30 MINUTES PRIOR TO BREAKFAST 90 capsule 1   Pembrolizumab (KEYTRUDA IV) Inject into the vein every 21 ( twenty-one) days.     silodosin (RAPAFLO) 8 MG CAPS capsule Take 1 capsule (8 mg total) by mouth daily with breakfast. 30 capsule 11   No current facility-administered medications for this visit.   Facility-Administered Medications Ordered in Other Visits  Medication Dose Route Frequency Provider Last Rate Last Admin   sodium chloride flush (NS) 0.9 % injection 10 mL  10 mL Intracatheter PRN Derek Jack, MD   10 mL at 06/24/19 0925    ALLERGIES:  No Known Allergies  PHYSICAL EXAM:  Performance status (ECOG): 1 - Symptomatic but completely ambulatory  Vitals:   01/10/21  1206  BP: 104/71  Pulse: 84  Resp: 18  Temp: 98.4 F (36.9 C)  SpO2: 100%   Wt Readings from Last 3 Encounters:  01/10/21 130 lb 1.6 oz (59 kg)  01/03/21 135 lb (61.2 kg)  12/15/20 127 lb 4 oz (57.7 kg)   Physical Exam Vitals reviewed.  Constitutional:      Appearance: Normal appearance.  Cardiovascular:     Rate and Rhythm: Normal rate and regular rhythm.     Pulses: Normal pulses.     Heart sounds: Normal heart sounds.  Pulmonary:     Effort: Pulmonary effort is normal.     Breath sounds: Normal breath sounds.  Abdominal:     Palpations: Abdomen is soft. There is no mass.     Tenderness: There is no abdominal tenderness.  Neurological:     General: No focal deficit present.     Mental Status: He is alert and oriented to person, place, and time.  Psychiatric:        Mood and Affect: Mood normal.        Behavior: Behavior normal.    LABORATORY DATA:  I have reviewed the labs as listed.  CBC Latest Ref Rng & Units 01/10/2021 01/03/2021 11/30/2020  WBC 4.0 - 10.5 K/uL 4.6 5.0 3.9(L)  Hemoglobin 13.0 - 17.0 g/dL 12.8(L) 13.7 12.6(L)  Hematocrit 39.0 - 52.0 % 39.6 42.6 38.7(L)  Platelets 150 - 400 K/uL 179 202 176   CMP Latest Ref Rng & Units 01/10/2021 11/30/2020 11/08/2020  Glucose 70 - 99 mg/dL 104(H) 96 102(H)  BUN 8 - 23 mg/dL 31(H) 24(H) 32(H)  Creatinine 0.61 - 1.24 mg/dL 1.65(H) 1.82(H) 1.90(H)  Sodium 135 - 145 mmol/L 139 138 139  Potassium 3.5 - 5.1 mmol/L 4.3 4.6 4.4  Chloride 98 - 111 mmol/L 106 105 104  CO2 22 - 32 mmol/L _0 Calcium 8.9 - 10.3 mg/dL 9.1 8.6(L) 9.0  Total Protein 6.5 - 8.1 g/dL 7.0 6.7 6.8  Total Bilirubin 0.3 - 1.2 mg/dL 0.3 0.6 0.5  Alkaline Phos 38 - 126 U/L 57 48 52  AST 15 - 41 U/L 23 25 21  ALT 0 - 44 U/L _0 DIAGNOSTIC IMAGING:  I have independently reviewed the scans and discussed with the patient. CT RENAL BIOPSY  Result Date: 01/03/2021 INDICATION: History of lung and prostate cancer, now with indeterminate  mass arising from the inferior pole of the left kidney worrisome for either metastatic disease versus a primary RCC. Please perform image guided biopsy for tissue diagnostic purposes. EXAM: ULTRASOUND AND CT-GUIDED BIOPSY OF LEFT RENAL MASS COMPARISON:  CT the chest, abdomen and pelvis-10/27/2020 MEDICATIONS: None. ANESTHESIA/SEDATION: Fentanyl 75 mcg IV; Versed 1 mg IV Sedation time: 19 minutes; The patient was continuously monitored during the procedure by the interventional radiology nurse under my direct supervision. CONTRAST:  None. COMPLICATIONS: SIR Level A - No therapy, no consequence. Procedure complicated by development of a trace amount of asymptomatic peri biopsy hemorrhage PROCEDURE: Informed consent was obtained from the patient following an explanation of the procedure, risks, benefits and alternatives. A time out was performed prior to the initiation of the procedure. The patient was positioned prone on the CT table and a limited CT was performed for procedural planning demonstrating unchanged size and appearance of the approximately 4.9 x 4.3 cm partially exophytic mass arising from the inferior pole of the left kidney (image 39, series 2). The CT gantry table position was marked and lesion was identified sonographically. The procedure was planned. The operative site was prepped and draped in the usual sterile fashion. Under direct ultrasound guidance, the mass was targeted with a 17 gauge coaxial needle. Appropriate position was confirmed with CT imaging (image 12, series 3). Next, approximately 2 cc of cloudy bloody fluid was aspirated. Next, under direct ultrasound guidance, 6 core needle biopsies were obtained. The coaxial needle was removed following administration and superficial hemostasis was achieved with manual compression. A limited postprocedural CT demonstrated a trace amount of asymptomatic peri biopsy hemorrhage. A dressing was applied. The patient tolerated the procedure well without  immediate postprocedural complication. IMPRESSION: 1. Technically successful CT guided core needle biopsy of exophytic mass arising from the inferior pole of the left kidney. 2. Approximately 2 cc of cloudy, bloody fluid from the lesion was capped and sent to the laboratory for both culture and cytologic analysis. Electronically Signed   By: Sandi Mariscal M.D.   On: 01/03/2021 15:10     ASSESSMENT:  1.  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 -6 cycles of carboplatin, paclitaxel and pembrolizumab from 05/03/2018 through 08/21/2018. -Maintenance pembrolizumab started on 09/11/2018. -PET scan on 09/15/2019 showed interval decrease in hypermetabolic areas associated with tongue and floor of the mouth.  Hypermetabolic metastatic lymphadenopathy in the chest is [name].  No new sites seen. -PET scan on 03/15/2020 shows persistent, [name] hypermetabolism in the tongue/floor of mouth.  Slight interval decrease in hypermetabolism with mediastinal/hilar adenopathy.  Persistent hypermetabolic focus in the right supraclavicular region.  New focus of hypermetabolic them identified in the right external iliac chain of pelvis with no discernible adenopathy on the CT.   2.  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   PLAN:  1.  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  Lesion was negative on PSMA PET scan.  [name] mediastinal  lymph nodes. 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 - Keytruda by itself is not controlling his cancer at this time.  He has been on it for the last 2 and half years. - I have recommended reinstituting 4-6 cycles of carboplatin and paclitaxel  along with Keytruda to get control again.  He did not experience any major side effects from chemotherapy in the past 2 to 3 years ago.  We discussed side effects in detail. - We will start him on first cycle of chemotherapy later this week.  I will see him back in 3 weeks for follow-up.   2.  Hypothyroidism: - TSH today is 2.2.  Continue Synthroid 88 mcg daily.   3.  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   4.  Bilateral knee pains: - Continue hydrocodone twice daily as needed.   5.  Nutrition: - He has cut back on drinking boost 1 can/day.  As result his appetite improved.  His weight has gone up by 3 pounds.   6.  CKD: - Baseline creatinine between 1.5-1.8.  Today creatinine is 1.65.   7.  Prostate cancer: - He was evaluated by Dr. Alyson Ingles.  Biopsy was not recommended due to his terminal cancer. - He chose not to start Lupron.  8.  Left kidney mass: - This has grown since early part of this year. - We reviewed biopsy results from 01/03/2021 of the left kidney mass which was consistent with metastatic squamous cell carcinoma, positive for p16, p63, PAX 8.   Orders placed this encounter:  No orders of the defined types were placed in this encounter.    Derek Jack, MD [place] [phone]   I, Thana Ates, am acting as a scribe for Dr. Derek Jack.  I, Derek Jack MD, have reviewed the above documentation for accuracy and completeness, and I agree with the above.

## 2021-01-10 ENCOUNTER — Inpatient Hospital Stay (HOSPITAL_COMMUNITY): Payer: Medicare Other

## 2021-01-10 ENCOUNTER — Other Ambulatory Visit (HOSPITAL_COMMUNITY): Payer: Medicare Other

## 2021-01-10 ENCOUNTER — Inpatient Hospital Stay (HOSPITAL_COMMUNITY): Payer: Medicare Other | Attending: Hematology | Admitting: Hematology

## 2021-01-10 ENCOUNTER — Other Ambulatory Visit: Payer: Self-pay

## 2021-01-10 VITALS — BP 104/71 | HR 84 | Temp 98.4°F | Resp 18 | Wt 130.1 lb

## 2021-01-10 DIAGNOSIS — Z5189 Encounter for other specified aftercare: Secondary | ICD-10-CM | POA: Insufficient documentation

## 2021-01-10 DIAGNOSIS — Z79890 Hormone replacement therapy (postmenopausal): Secondary | ICD-10-CM | POA: Insufficient documentation

## 2021-01-10 DIAGNOSIS — R972 Elevated prostate specific antigen [PSA]: Secondary | ICD-10-CM

## 2021-01-10 DIAGNOSIS — Z9221 Personal history of antineoplastic chemotherapy: Secondary | ICD-10-CM | POA: Insufficient documentation

## 2021-01-10 DIAGNOSIS — E039 Hypothyroidism, unspecified: Secondary | ICD-10-CM | POA: Insufficient documentation

## 2021-01-10 DIAGNOSIS — Z85810 Personal history of malignant neoplasm of tongue: Secondary | ICD-10-CM | POA: Diagnosis not present

## 2021-01-10 DIAGNOSIS — N189 Chronic kidney disease, unspecified: Secondary | ICD-10-CM | POA: Insufficient documentation

## 2021-01-10 DIAGNOSIS — N2889 Other specified disorders of kidney and ureter: Secondary | ICD-10-CM | POA: Diagnosis not present

## 2021-01-10 DIAGNOSIS — Z5112 Encounter for antineoplastic immunotherapy: Secondary | ICD-10-CM | POA: Diagnosis present

## 2021-01-10 DIAGNOSIS — M25561 Pain in right knee: Secondary | ICD-10-CM | POA: Insufficient documentation

## 2021-01-10 DIAGNOSIS — C7902 Secondary malignant neoplasm of left kidney and renal pelvis: Secondary | ICD-10-CM | POA: Insufficient documentation

## 2021-01-10 DIAGNOSIS — Z5111 Encounter for antineoplastic chemotherapy: Secondary | ICD-10-CM | POA: Insufficient documentation

## 2021-01-10 DIAGNOSIS — C61 Malignant neoplasm of prostate: Secondary | ICD-10-CM | POA: Diagnosis not present

## 2021-01-10 DIAGNOSIS — Z87891 Personal history of nicotine dependence: Secondary | ICD-10-CM | POA: Diagnosis not present

## 2021-01-10 DIAGNOSIS — Z923 Personal history of irradiation: Secondary | ICD-10-CM | POA: Insufficient documentation

## 2021-01-10 DIAGNOSIS — C3492 Malignant neoplasm of unspecified part of left bronchus or lung: Secondary | ICD-10-CM

## 2021-01-10 DIAGNOSIS — M25562 Pain in left knee: Secondary | ICD-10-CM | POA: Insufficient documentation

## 2021-01-10 LAB — COMPREHENSIVE METABOLIC PANEL
ALT: 22 U/L (ref 0–44)
AST: 23 U/L (ref 15–41)
Albumin: 3.7 g/dL (ref 3.5–5.0)
Alkaline Phosphatase: 57 U/L (ref 38–126)
Anion gap: 6 (ref 5–15)
BUN: 31 mg/dL — ABNORMAL HIGH (ref 8–23)
CO2: 27 mmol/L (ref 22–32)
Calcium: 9.1 mg/dL (ref 8.9–10.3)
Chloride: 106 mmol/L (ref 98–111)
Creatinine, Ser: 1.65 mg/dL — ABNORMAL HIGH (ref 0.61–1.24)
GFR, Estimated: 44 mL/min — ABNORMAL LOW (ref >60)
Glucose, Bld: 104 mg/dL — ABNORMAL HIGH (ref 70–99)
Potassium: 4.3 mmol/L (ref 3.5–5.1)
Sodium: 139 mmol/L (ref 135–145)
Total Bilirubin: 0.3 mg/dL (ref 0.3–1.2)
Total Protein: 7 g/dL (ref 6.5–8.1)

## 2021-01-10 LAB — CBC WITH DIFFERENTIAL/PLATELET
Abs Immature Granulocytes: 0.02 10*3/uL (ref 0.00–0.07)
Basophils Absolute: 0 10*3/uL (ref 0.0–0.1)
Basophils Relative: 0 %
Eosinophils Absolute: 0.2 10*3/uL (ref 0.0–0.5)
Eosinophils Relative: 4 %
HCT: 39.6 % (ref 39.0–52.0)
Hemoglobin: 12.8 g/dL — ABNORMAL LOW (ref 13.0–17.0)
Immature Granulocytes: 0 %
Lymphocytes Relative: 21 %
Lymphs Abs: 1 10*3/uL (ref 0.7–4.0)
MCH: 30.3 pg (ref 26.0–34.0)
MCHC: 32.3 g/dL (ref 30.0–36.0)
MCV: 93.8 fL (ref 80.0–100.0)
Monocytes Absolute: 0.6 10*3/uL (ref 0.1–1.0)
Monocytes Relative: 12 %
Neutro Abs: 2.9 10*3/uL (ref 1.7–7.7)
Neutrophils Relative %: 63 %
Platelets: 179 10*3/uL (ref 150–400)
RBC: 4.22 MIL/uL (ref 4.22–5.81)
RDW: 14.3 % (ref 11.5–15.5)
WBC: 4.6 10*3/uL (ref 4.0–10.5)
nRBC: 0 % (ref 0.0–0.2)

## 2021-01-10 LAB — PSA: Prostatic Specific Antigen: 87.61 ng/mL — ABNORMAL HIGH (ref 0.00–4.00)

## 2021-01-10 LAB — TSH: TSH: 2.294 u[IU]/mL (ref 0.350–4.500)

## 2021-01-10 NOTE — Progress Notes (Signed)
No treatment today per Dr.Katragadda.

## 2021-01-12 ENCOUNTER — Other Ambulatory Visit: Payer: Self-pay

## 2021-01-12 ENCOUNTER — Inpatient Hospital Stay (HOSPITAL_COMMUNITY): Payer: Medicare Other

## 2021-01-12 VITALS — BP 121/76 | HR 71 | Temp 97.0°F | Resp 18

## 2021-01-12 DIAGNOSIS — C109 Malignant neoplasm of oropharynx, unspecified: Secondary | ICD-10-CM

## 2021-01-12 DIAGNOSIS — Z5112 Encounter for antineoplastic immunotherapy: Secondary | ICD-10-CM | POA: Diagnosis not present

## 2021-01-12 MED ORDER — SODIUM CHLORIDE 0.9 % IV SOLN: Freq: Once | INTRAVENOUS | Status: AC

## 2021-01-12 MED ORDER — SODIUM CHLORIDE 0.9 % IV SOLN
200.0000 mg | Freq: Once | INTRAVENOUS | Status: AC
  Administered 2021-01-12: 200 mg via INTRAVENOUS
  Filled 2021-01-12: qty 8

## 2021-01-12 MED ORDER — SODIUM CHLORIDE 0.9 % IV SOLN
175.0000 mg/m2 | Freq: Once | INTRAVENOUS | Status: AC
  Administered 2021-01-12: 330 mg via INTRAVENOUS
  Filled 2021-01-12: qty 55

## 2021-01-12 MED ORDER — FAMOTIDINE 20 MG IN NS 100 ML IVPB
20.0000 mg | Freq: Once | INTRAVENOUS | Status: AC
  Administered 2021-01-12: 20 mg via INTRAVENOUS
  Filled 2021-01-12: qty 20

## 2021-01-12 MED ORDER — SODIUM CHLORIDE 0.9 % IV SOLN
300.0000 mg | Freq: Once | INTRAVENOUS | Status: AC
  Administered 2021-01-12: 300 mg via INTRAVENOUS
  Filled 2021-01-12: qty 30

## 2021-01-12 MED ORDER — SODIUM CHLORIDE 0.9% FLUSH
10.0000 mL | INTRAVENOUS | Status: DC | PRN
  Administered 2021-01-12: 10 mL

## 2021-01-12 MED ORDER — DIPHENHYDRAMINE HCL 50 MG/ML IJ SOLN
50.0000 mg | Freq: Once | INTRAMUSCULAR | Status: AC
  Administered 2021-01-12: 50 mg via INTRAVENOUS
  Filled 2021-01-12: qty 1

## 2021-01-12 MED ORDER — PALONOSETRON HCL INJECTION 0.25 MG/5ML
0.2500 mg | Freq: Once | INTRAVENOUS | Status: AC
  Administered 2021-01-12: 0.25 mg via INTRAVENOUS
  Filled 2021-01-12: qty 5

## 2021-01-12 MED ORDER — SODIUM CHLORIDE 0.9 % IV SOLN
20.0000 mg | Freq: Once | INTRAVENOUS | Status: AC
  Administered 2021-01-12: 20 mg via INTRAVENOUS
  Filled 2021-01-12: qty 20

## 2021-01-12 MED ORDER — HEPARIN SOD (PORK) LOCK FLUSH 100 UNIT/ML IV SOLN: 500.0000 [IU] | Freq: Once | INTRAVENOUS | Status: DC | PRN

## 2021-01-12 NOTE — Patient Instructions (Signed)
[facility]  Discharge Instructions: Thank you for choosing [facility] to provide your oncology and hematology care.  If you have a lab appointment with the Cancer Center, please come in thru the Main Entrance and check in at the main information desk.  Wear comfortable clothing and clothing appropriate for easy access to any Portacath or PICC line.   We strive to give you quality time with your provider. You may need to reschedule your appointment if you arrive late (15 or more minutes).  Arriving late affects you and other patients whose appointments are after yours.  Also, if you miss three or more appointments without notifying the office, you may be dismissed from the clinic at the provider's discretion.      For prescription refill requests, have your pharmacy contact our office and allow 72 hours for refills to be completed.        To help prevent nausea and vomiting after your treatment, we encourage you to take your nausea medication as directed.  BELOW ARE SYMPTOMS THAT SHOULD BE REPORTED IMMEDIATELY: *FEVER GREATER THAN 100.4 F (38 C) OR HIGHER *CHILLS OR SWEATING *NAUSEA AND VOMITING THAT IS NOT CONTROLLED WITH YOUR NAUSEA MEDICATION *UNUSUAL SHORTNESS OF BREATH *UNUSUAL BRUISING OR BLEEDING *URINARY PROBLEMS (pain or burning when urinating, or frequent urination) *BOWEL PROBLEMS (unusual diarrhea, constipation, pain near the anus) TENDERNESS IN MOUTH AND THROAT WITH OR WITHOUT PRESENCE OF ULCERS (sore throat, sores in mouth, or a toothache) UNUSUAL RASH, SWELLING OR PAIN  UNUSUAL VAGINAL DISCHARGE OR ITCHING   Items with * indicate a potential emergency and should be followed up as soon as possible or go to the Emergency Department if any problems should occur.  Please show the CHEMOTHERAPY ALERT CARD or IMMUNOTHERAPY ALERT CARD at check-in to the Emergency Department and triage nurse.  Should you have questions after your visit or need to cancel  or reschedule your appointment, please contact [facility] [phone]  and follow the prompts.  Office hours are 8:00 a.m. to 4:30 p.m. Monday - Friday. Please note that voicemails left after 4:00 p.m. may not be returned until the following business day.  We are closed weekends and major holidays. You have access to a nurse at all times for urgent questions. Please call the main number to the clinic [phone] and follow the prompts.  For any non-urgent questions, you may also contact your provider using MyChart. We now offer e-Visits for anyone 18 and older to request care online for non-urgent symptoms. For details visit [email or web address].   Also download the MyChart app! Go to the app store, search "MyChart", open the app, select [facility], and log in with your MyChart username and password.  Due to Covid, a mask is required upon entering the hospital/clinic. If you do not have a mask, one will be given to you upon arrival. For doctor visits, patients may have 1 support person aged 18 or older with them. For treatment visits, patients cannot have anyone with them due to current Covid guidelines and our immunocompromised population.  

## 2021-01-12 NOTE — Progress Notes (Signed)
Labs reviewed by MD, ok to treat. Creatinine noted by MD.    1421-Nurse came into flush IV to deaccess port, pt told nurse he felt something wet. Dressing was CDI. Nurse tried to flush again and no blood return was noted and patient could feel saline coming out.  Needle removed from chest.  Port site swollen with serosanguinous fluid.  MD notified.  MD at bedside to evaluate site.  Carboplatin was last administered.  Patient stated he had just been up to the bathroom 15 min prior to this. Patient denied any pain or SOB.  No redness or itching noted.  Patient asymptomatic at this time.  Patient advised to apply dry ice packs for 20 minutes every 4 hours during the day.  Patient was washed with mild soap and water. Patient will be reassessed at the clinic tomorrow morning by MD.  Patient was advised to ED for any chest pain or SOB.

## 2021-01-13 ENCOUNTER — Inpatient Hospital Stay (HOSPITAL_BASED_OUTPATIENT_CLINIC_OR_DEPARTMENT_OTHER): Payer: Medicare Other

## 2021-01-13 DIAGNOSIS — Z452 Encounter for adjustment and management of vascular access device: Secondary | ICD-10-CM

## 2021-01-13 NOTE — Progress Notes (Signed)
Pt came in today for a port sight check. No swelling, reddnes or rash noted. MD came in to evaluate. No further action needed. Follow up as scheduled.

## 2021-01-14 ENCOUNTER — Other Ambulatory Visit: Payer: Self-pay

## 2021-01-14 ENCOUNTER — Inpatient Hospital Stay (HOSPITAL_COMMUNITY): Payer: Medicare Other

## 2021-01-14 VITALS — BP 148/98 | HR 91 | Temp 97.5°F | Resp 18

## 2021-01-14 DIAGNOSIS — C109 Malignant neoplasm of oropharynx, unspecified: Secondary | ICD-10-CM

## 2021-01-14 DIAGNOSIS — Z5112 Encounter for antineoplastic immunotherapy: Secondary | ICD-10-CM | POA: Diagnosis not present

## 2021-01-14 MED ORDER — PEGFILGRASTIM-BMEZ 6 MG/0.6ML ~~LOC~~ SOSY
6.0000 mg | PREFILLED_SYRINGE | Freq: Once | SUBCUTANEOUS | Status: AC
  Administered 2021-01-14: 6 mg via SUBCUTANEOUS
  Filled 2021-01-14: qty 0.6

## 2021-01-14 NOTE — Progress Notes (Signed)
FREDERICK MARRO presents today for injection per the provider's orders.  Ziextenzo administration without incident; injection site WNL; see MAR for injection details.  Patient tolerated procedure well and without incident.  No questions or complaints noted at this time.   Ziextenzo injection given today per MD orders. Tolerated infusion without adverse affects. Vital signs stable. No complaints at this time. Discharged from clinic ambulatory in stable condition. Alert and oriented x 3. F/U with [facility] as scheduled.

## 2021-01-14 NOTE — Patient Instructions (Signed)
[facility]  Discharge Instructions: Thank you for choosing [facility] to provide your oncology and hematology care.  If you have a lab appointment with the [facility], please come in thru the Main Entrance and check in at the main information desk.  Wear comfortable clothing and clothing appropriate for easy access to any Portacath or PICC line.   We strive to give you quality time with your provider. You may need to reschedule your appointment if you arrive late (15 or more minutes).  Arriving late affects you and other patients whose appointments are after yours.  Also, if you miss three or more appointments without notifying the office, you may be dismissed from the clinic at the provider's discretion.      For prescription refill requests, have your pharmacy contact our office and allow 72 hours for refills to be completed.    Today you received Ziextenzo injection.      BELOW ARE SYMPTOMS THAT SHOULD BE REPORTED IMMEDIATELY: *FEVER GREATER THAN 100.4 F (38 C) OR HIGHER *CHILLS OR SWEATING *NAUSEA AND VOMITING THAT IS NOT CONTROLLED WITH YOUR NAUSEA MEDICATION *UNUSUAL SHORTNESS OF BREATH *UNUSUAL BRUISING OR BLEEDING *URINARY PROBLEMS (pain or burning when urinating, or frequent urination) *BOWEL PROBLEMS (unusual diarrhea, constipation, pain near the anus) TENDERNESS IN MOUTH AND THROAT WITH OR WITHOUT PRESENCE OF ULCERS (sore throat, sores in mouth, or a toothache) UNUSUAL RASH, SWELLING OR PAIN  UNUSUAL VAGINAL DISCHARGE OR ITCHING   Items with * indicate a potential emergency and should be followed up as soon as possible or go to the Emergency Department if any problems should occur.  Please show the CHEMOTHERAPY ALERT CARD or IMMUNOTHERAPY ALERT CARD at check-in to the Emergency Department and triage nurse.  Should you have questions after your visit or need to cancel or reschedule your appointment, please contact [facility]  [phone]  and follow the prompts.  Office hours are 8:00 a.m. to 4:30 p.m. Monday - Friday. Please note that voicemails left after 4:00 p.m. may not be returned until the following business day.  We are closed weekends and major holidays. You have access to a nurse at all times for urgent questions. Please call the main number to the clinic [phone] and follow the prompts.  For any non-urgent questions, you may also contact your provider using MyChart. We now offer e-Visits for anyone 20 and older to request care online for non-urgent symptoms. For details visit [email or web address].   Also download the MyChart app! Go to the app store, search "MyChart", open the app, select [facility], and log in with your MyChart username and password.  Due to Covid, a mask is required upon entering the hospital/clinic. If you do not have a mask, one will be given to you upon arrival. For doctor visits, patients may have 1 support person aged 15 or older with them. For treatment visits, patients cannot have anyone with them due to current Covid guidelines and our immunocompromised population.

## 2021-02-01 NOTE — Progress Notes (Signed)
[name], [place] [address]   CLINIC:  Medical Oncology/Hematology  PCP:  Lemmie Evens, MD [place]. 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   07/28/2014 Initial Diagnosis   Oropharyngeal cancer   08/03/2014 Imaging   CT CAP- L supraclavicular lymphadenopathy is not completely visualized. This is better seen on the previous neck CT from 07/27/2014. Otherwise, no evidence for metastatic disease in the chest, abdomen, or pelvis.   08/03/2014 Imaging   Bone scan- Uptake at adjacent anterior LEFT 6, 7, 8 ribs likely representing trauma/fractures. Questionable nonspecific increased tracer localization at the posterior RIGHT 8th and 9th ribs, the adjacent nature which raises a a question of trauma as well   08/06/2014 Pathology Results   Dr. Benjamine [name]- Oropharynx, biopsy, Left - INVASIVE SQUAMOUS CELL CARCINOMA.   08/12/2014 Procedure   Dr. Enrique [name]- 1. Multiple extraction of tooth numbers 6, 17, 22, 23, 24, 25, 26, and 27. 3 Quadrants of alveoloplasty   08/17/2014 Pathology Results   PORT and G-TUBE placed by Dr. Carlis Stable.   08/26/2014 PET scan   Large hypermetabolic mass in the left base of tongue. Activity extends across midline to the right base tongue. 2. Intensely hypermetabolic left cervical metastatic lymph nodes. Lymph nodes extend from the left level II position to the left supraclavi   09/01/2014 - 09/22/2014 Chemotherapy   Concurrent chemoradiation with Cisplatin 100 mg/m2 x 2 cycles with Neulasta  support. Held cycle #3 d/t renal toxicity.    09/03/2014 - 10/23/2014 Radiation Therapy   Treated in [name], IMRT Alexander [name]).  Base of tongue and bilat neck. Total dose: 70 Gy in 35 fractions. (of note, he did miss several treatments requiring BID dosing towards the end of treatment).    01/25/2015 PET scan   Near complete resolution of metabolic activity at the base of tongue. Minimal residual activity is likely post treatment effect. 2. Complete resolution of metabolic activity above LEFT cervical lymph nodes. No evidence of residual metabolically active    2/77/4128 Procedure   Port-a-cath removed Alexander [name])    05/03/2018 - 08/23/2018 Chemotherapy   The patient had dexamethasone (DECADRON) 4 MG tablet, 8 mg, Oral, Daily, 1 of 1 cycle, Start date: 05/01/2018, End date: 10/23/2018 palonosetron (ALOXI) injection 0.25 mg, 0.25 mg, Intravenous,  Once, 6 of 6 cycles Administration: 0.25 mg (05/03/2018), 0.25 mg (05/24/2018), 0.25 mg (06/14/2018), 0.25 mg (07/09/2018), 0.25 mg (07/30/2018), 0.25 mg (08/21/2018) pegfilgrastim-cbqv (UDENYCA) injection 6 mg, 6 mg, Subcutaneous, Once, 5 of 5 cycles Administration: 6 mg (05/27/2018), 6 mg (06/17/2018), 6 mg (07/11/2018), 6 mg (08/01/2018), 6 mg (08/23/2018) CARBOplatin (PARAPLATIN) 380 mg in sodium chloride 0.9 % 250 mL chemo infusion, 380 mg (100 % of original dose 381 mg), Intravenous,  Once, 6 of 6 cycles Dose modification:   (original dose 381 mg, Cycle 1),   (original dose 309.5 mg, Cycle 2), 307.5 mg (original dose 309.5 mg, Cycle 5) Administration: 380 mg (05/03/2018), 310 mg (05/24/2018), 310 mg (06/14/2018), 340 mg (07/09/2018), 310 mg (07/30/2018), 350 mg (08/21/2018) PACLitaxel (TAXOL) 330 mg in sodium chloride 0.9 %  500 mL chemo infusion (> $RemoveBef'80mg'SCdXtqVokf$ /m2), 175 mg/m2 = 330 mg (100 % of original dose 175 mg/m2), Intravenous,  Once, 6 of 6 cycles Dose modification: 175 mg/m2 (original dose 175 mg/m2, Cycle 1, Reason: Patient Age) Administration: 330 mg (05/03/2018), 330 mg (05/24/2018),  330 mg (06/14/2018), 330 mg (07/09/2018), 330 mg (07/30/2018), 330 mg (08/21/2018)   for chemotherapy treatment.     05/24/2018 -  Chemotherapy   Patient is on Treatment Plan : HEAD/NECK Pembrolizumab/Taxol/Carboplatin Q21D     Squamous cell lung cancer, left ([name])  06/14/2018 Initial Diagnosis   Squamous cell lung cancer, left ([facility])     CANCER STAGING: Cancer Staging Oropharyngeal carcinoma (Alexander [name]) Staging form: Pharynx - Oropharynx, AJCC 7th Edition - Clinical: Stage IVA (T4a, N2b, M0) - Unsigned   INTERVAL HISTORY:  Alexander [name], a 70 y.o. male, returns for routine follow-up and consideration for next cycle of chemotherapy. Alexander [name] was last seen on 01/10/2021.  Due for cycle #45 of Keytruda, Taxol, & Carboplatin today.   Overall, he tells me he has been feeling pretty well. His appetite is good. He denies n/v/d, tingling/numbness, cold sensation, and severe fatigue. His bilateral knee pain is stable with hydrocodone. He drinks about 1-2 Boost daily.   Overall, he feels ready for next cycle of chemo today.   REVIEW OF SYSTEMS:  Review of Systems  Constitutional:  Negative for appetite change and fatigue (80%).  Gastrointestinal:  Negative for diarrhea, nausea and vomiting.  Musculoskeletal:  Positive for arthralgias (7/10 bilateral knees).  Neurological:  Negative for numbness.  All other systems reviewed and are negative.  PAST MEDICAL/SURGICAL HISTORY:  Past Medical History:  Diagnosis Date   GERD (gastroesophageal reflux disease)    Mass of neck    dx. oropharyngeal squamous cell carcinoma- Chemo. 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 3 weeks ago.- Dr. Oneal [name] center [place], [place].   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   SCCa of Left BOT   Past Surgical History:  Procedure Laterality Date   BIOPSY  01/15/2018   Procedure: BIOPSY;  Surgeon: Danie Binder, MD;  [facility];  Service: Endoscopy;;   gastric   COLONOSCOPY N/A 03/13/2016   Procedure: COLONOSCOPY;  Surgeon: Danie Binder, MD;  [facility];  Service: Endoscopy;  Laterality: N/A;  2:15 PM   ESOPHAGOGASTRODUODENOSCOPY (EGD) WITH PROPOFOL N/A 08/17/2014   Procedure: ESOPHAGOGASTRODUODENOSCOPY (EGD) WITH PROPOFOL (procedure #1);  Surgeon: Aviva Signs Md, MD;  Location: [facility];  Service: General;  Laterality: N/A;   ESOPHAGOGASTRODUODENOSCOPY (EGD) WITH PROPOFOL N/A 01/15/2018   Procedure: ESOPHAGOGASTRODUODENOSCOPY (EGD) WITH PROPOFOL;  Surgeon: Danie Binder, MD;  [facility];  Service: Endoscopy;  Laterality: N/A;  9:30am   MULTIPLE EXTRACTIONS WITH ALVEOLOPLASTY N/A 08/12/2014   Procedure: Extraction of tooth #'s 6,17,22,23,24,25,26,27 with alveoloplasty;  Surgeon: Lenn Cal, DDS;  Location: [facility];  Service: Oral Surgery;  Laterality: N/A;   PANENDOSCOPY N/A 08/06/2014   Procedure: PANENDOSCOPY WITH BIOPSY;  Surgeon: Leta Baptist, MD;  Location: [facility];  Service: ENT;  Laterality: N/A;   PEG PLACEMENT Left 08/17/14   PEG PLACEMENT N/A 08/17/2014   Procedure: PERCUTANEOUS ENDOSCOPIC GASTROSTOMY (PEG) PLACEMENT (procedure #1);  Surgeon: Aviva Signs Md, MD;  Location: [facility];  Service: General;  Laterality: N/A;   PORT-A-CATH REMOVAL Right 07/17/2016   Procedure: MINOR REMOVAL PORT-A-CATH;  Surgeon: Aviva Signs, MD;  Location: [facility];  Service: General;  Laterality: Right;   PORTACATH PLACEMENT Right 08/17/14   PORTACATH PLACEMENT Right 08/17/2014   Procedure: INSERTION PORT-A-CATH (procedure #2);  Surgeon: Aviva Signs Md, MD;  Location: [facility];  Service: General;  Laterality: Right;   PORTACATH PLACEMENT Left 04/26/2018   Procedure: INSERTION PORT-A-CATH (attached catheter in left subclavian);  Surgeon: Aviva Signs, MD;  Location: [facility];  Service: General;  Laterality: Left;   SAVORY DILATION N/A 01/15/2018   Procedure: SAVORY DILATION;  Surgeon: Danie Binder, MD;  [facility];  Service: Endoscopy;  Laterality: N/A;   VIDEO BRONCHOSCOPY WITH ENDOBRONCHIAL ULTRASOUND N/A 04/15/2018   Procedure: VIDEO BRONCHOSCOPY WITH ENDOBRONCHIAL ULTRASOUND;  Surgeon: Melrose Nakayama, MD;  Location: [facility] OR;  Service: Thoracic;  Laterality: N/A;    SOCIAL HISTORY:  Social History   Socioeconomic History   Marital status: Legally Separated    Spouse name: Not on file   Number of children: 5   Years of education: Not on file   Highest education level: Not on file  Occupational History   Not on file  Tobacco Use   Smoking status: Former    Packs/day: 0.50    Years: 30.00    Pack years: 15.00    Types: Cigarettes    Quit date: 07/22/2014    Years since quitting: 6.5   Smokeless tobacco: Never  Vaping Use   Vaping Use: Never used  Substance and Sexual Activity   Alcohol use: Not Currently    Alcohol/week: 0.0 standard drinks    Comment: None currently (11/09/17); previously 1-2 beers on the weekend   Drug use: No   Sexual activity: Not on file  Other Topics Concern   Not on file  Social History Narrative   Not on file   Social Determinants of Health   Financial Resource Strain: Low Risk    Difficulty of Paying Living Expenses: Not hard at all  Food Insecurity: No Food Insecurity   Worried About Charity fundraiser in the Last Year: Never true   [place] in the Last Year: Never true  Transportation Needs: No Transportation Needs   Lack of Transportation (Medical): No   Lack of Transportation (Non-Medical): No  Physical Activity: Inactive   Days of Exercise per Week: 0 days   Minutes of Exercise per Session: 0 min  Stress: No Stress Concern Present   Feeling of Stress : Not at all  Social Connections: Moderately Isolated   Frequency of Communication with Friends and Family: More than three times a week   Frequency of Social Gatherings with Friends and Family: Twice a week   Attends Religious Services: Never   Marine scientist or  Organizations: No   Attends Music therapist: Never   Marital Status: Married  Human resources officer Violence: Not At Risk   Fear of Current or Ex-Partner: No   Emotionally Abused: No   Physically Abused: No   Sexually Abused: No    FAMILY HISTORY:  Family History  Problem Relation Age of Onset   Colon cancer Neg Hx    Gastric cancer Neg Hx    Esophageal cancer Neg Hx     CURRENT MEDICATIONS:  Current Outpatient Medications  Medication Sig Dispense Refill   feeding supplement, ENSURE ENLIVE, (ENSURE ENLIVE) LIQD Take 237 mLs by mouth 4 (four) times daily.      HYDROcodone-acetaminophen (NORCO/VICODIN) 5-325 MG tablet Take 1 tablet by mouth every 12 (twelve) hours as needed for moderate pain.  60 tablet 0   levothyroxine (SYNTHROID) 88 MCG tablet Take 1 tablet (88 mcg total) by mouth daily before breakfast. 30 tablet 3   Melatonin 10 MG TABS Take 1 tablet by mouth at bedtime.     naproxen sodium (ALEVE) 220 MG tablet Take 220 mg by mouth daily as needed.      omeprazole (PRILOSEC) 20 MG capsule TAKE 1 CAPSULE BY MOUTH 30 MINUTES PRIOR TO BREAKFAST 90 capsule 1   Pembrolizumab (KEYTRUDA IV) Inject into the vein every 21 ( twenty-one) days.     silodosin (RAPAFLO) 8 MG CAPS capsule Take 1 capsule (8 mg total) by mouth daily with breakfast. 30 capsule 11   No current facility-administered medications for this visit.   Facility-Administered Medications Ordered in Other Visits  Medication Dose Route Frequency Provider Last Rate Last Admin   sodium chloride flush (NS) 0.9 % injection 10 mL  10 mL Intracatheter PRN Derek Jack, MD   10 mL at 06/24/19 5003    ALLERGIES:  No Known Allergies  PHYSICAL EXAM:  Performance status (ECOG): 1 - Symptomatic but completely ambulatory  There were no vitals filed for this visit. Wt Readings from Last 3 Encounters:  01/10/21 130 lb 1.6 oz (59 kg)  01/03/21 135 lb (61.2 kg)  12/15/20 127 lb 4 oz (57.7 kg)   Physical  Exam Vitals reviewed.  Constitutional:      Appearance: Normal appearance.  Cardiovascular:     Rate and Rhythm: Normal rate and regular rhythm.     Pulses: Normal pulses.     Heart sounds: Normal heart sounds.  Pulmonary:     Effort: Pulmonary effort is normal.     Breath sounds: Normal breath sounds.  Neurological:     General: No focal deficit present.     Mental Status: He is alert and oriented to person, place, and time.  Psychiatric:        Mood and Affect: Mood normal.        Behavior: Behavior normal.    LABORATORY DATA:  I have reviewed the labs as listed.  CBC Latest Ref Rng & Units 01/10/2021 01/03/2021 11/30/2020  WBC 4.0 - 10.5 K/uL 4.6 5.0 3.9(L)  Hemoglobin 13.0 - 17.0 g/dL 12.8(L) 13.7 12.6(L)  Hematocrit 39.0 - 52.0 % 39.6 42.6 38.7(L)  Platelets 150 - 400 K/uL 179 202 176   CMP Latest Ref Rng & Units 01/10/2021 11/30/2020 11/08/2020  Glucose 70 - 99 mg/dL 104(H) 96 102(H)  BUN 8 - 23 mg/dL 31(H) 24(H) 32(H)  Creatinine 0.61 - 1.24 mg/dL 1.65(H) 1.82(H) 1.90(H)  Sodium 135 - 145 mmol/L 139 138 139  Potassium 3.5 - 5.1 mmol/L 4.3 4.6 4.4  Chloride 98 - 111 mmol/L 106 105 104  CO2 22 - 32 mmol/L $RemoveB'27 27 28  'GGRGNVqx$ Calcium 8.9 - 10.3 mg/dL 9.1 8.6(L) 9.0  Total Protein 6.5 - 8.1 g/dL 7.0 6.7 6.8  Total Bilirubin 0.3 - 1.2 mg/dL 0.3 0.6 0.5  Alkaline Phos 38 - 126 U/L 57 48 52  AST 15 - 41 U/L $Remo'23 25 21  'pXuHw$ ALT 0 - 44 U/L $Remo'22 21 18    'zKlhi$ DIAGNOSTIC IMAGING:  I have independently reviewed the scans and discussed with the patient. CT RENAL BIOPSY  Result Date: 01/03/2021 INDICATION: History of lung and prostate cancer, now with indeterminate mass arising from the inferior pole of the left kidney worrisome for either metastatic disease versus a primary RCC. Please perform image guided biopsy for tissue diagnostic purposes. EXAM: ULTRASOUND AND  CT-GUIDED BIOPSY OF LEFT RENAL MASS COMPARISON:  CT the chest, abdomen and pelvis-10/27/2020 MEDICATIONS: None. ANESTHESIA/SEDATION:  Fentanyl 75 mcg IV; Versed 1 mg IV Sedation time: 19 minutes; The patient was continuously monitored during the procedure by the interventional radiology nurse under my direct supervision. CONTRAST:  None. COMPLICATIONS: SIR Level A - No therapy, no consequence. Procedure complicated by development of a trace amount of asymptomatic peri biopsy hemorrhage PROCEDURE: Informed consent was obtained from the patient following an explanation of the procedure, risks, benefits and alternatives. A time out was performed prior to the initiation of the procedure. The patient was positioned prone on the CT table and a limited CT was performed for procedural planning demonstrating unchanged size and appearance of the approximately 4.9 x 4.3 cm partially exophytic mass arising from the inferior pole of the left kidney (image 39, series 2). The CT gantry table position was marked and lesion was identified sonographically. The procedure was planned. The operative site was prepped and draped in the usual sterile fashion. Under direct ultrasound guidance, the mass was targeted with a 17 gauge coaxial needle. Appropriate position was confirmed with CT imaging (image 12, series 3). Next, approximately 2 cc of cloudy bloody fluid was aspirated. Next, under direct ultrasound guidance, 6 core needle biopsies were obtained. The coaxial needle was removed following administration and superficial hemostasis was achieved with manual compression. A limited postprocedural CT demonstrated a trace amount of asymptomatic peri biopsy hemorrhage. A dressing was applied. The patient tolerated the procedure well without immediate postprocedural complication. IMPRESSION: 1. Technically successful CT guided core needle biopsy of exophytic mass arising from the inferior pole of the left kidney. 2. Approximately 2 cc of cloudy, bloody fluid from the lesion was capped and sent to the laboratory for both culture and cytologic analysis. Electronically Signed    By: Sandi Mariscal M.D.   On: 01/03/2021 15:10     ASSESSMENT:  1.  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 -6 cycles of carboplatin, paclitaxel and pembrolizumab from 05/03/2018 through 08/21/2018. -Maintenance pembrolizumab started on 09/11/2018. -PET scan on 09/15/2019 showed interval decrease in hypermetabolic areas associated with tongue and floor of the mouth.  Hypermetabolic metastatic lymphadenopathy in the chest is stable.  No new sites seen. -PET scan on 03/15/2020 shows persistent, stable hypermetabolism in the tongue/floor of mouth.  Slight interval decrease in hypermetabolism with mediastinal/hilar adenopathy.  Persistent hypermetabolic focus in the right supraclavicular region.  New focus of hypermetabolic them identified in the right external iliac chain of pelvis with no discernible adenopathy on the CT. -CT CAP on 11/24/2020 showed left lower lobe lung nodule measuring 1.3 x 1.1 cm, previously 1.0 x 0.9 cm on CT scan from June.  Lesion was negative on PSMA PET scan.  Stable mediastinal lymph nodes. 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 - Cycle 1 of carboplatin, paclitaxel and pembrolizumab on 01/12/2021.   2.  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   PLAN:  1.  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 - He did not experience any major side effects.  No neuropathy symptoms. - Reviewed labs today which showed white count 3.2 and ANC of 1.7.  LFTs are normal. - He will proceed with cycle  2 without any dose modifications.  RTC 3 weeks for follow-up.  We will plan to rescan after cycle 3.   2.  Hypothyroidism: - Continue Synthroid 88 mcg daily.  TSH today is 2.5.   3.  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   4.  Bilateral knee pains: - Continue hydrocodone as needed for knee pains.   5.  Nutrition: - He is drinking boost about 2 cans/day.  Weight is stable.   6.  CKD: - Baseline creatinine between 1.5-1.8.   7.  Prostate cancer: - He was evaluated by Dr. Alyson Ingles.  Biopsy was not recommended due to his terminal cancer.  He chose not to start on Lupron.  Latest PSA is 87.  8.  Left kidney mass: - Biopsy on 01/03/2021 of the left kidney mass consistent with metastatic squamous cell carcinoma.   Orders placed this encounter:  No orders of the defined types were placed in this encounter.    Derek Jack, MD [facility] [phone]   I, Thana Ates, am acting as a scribe for Dr. Derek Jack.  I, Derek Jack MD, have reviewed the above documentation for accuracy and completeness, and I agree with the above.

## 2021-02-02 ENCOUNTER — Other Ambulatory Visit (HOSPITAL_COMMUNITY): Payer: Self-pay

## 2021-02-02 ENCOUNTER — Inpatient Hospital Stay (HOSPITAL_COMMUNITY): Payer: Medicare Other | Attending: Hematology | Admitting: Hematology

## 2021-02-02 ENCOUNTER — Encounter (HOSPITAL_COMMUNITY): Payer: Self-pay | Admitting: Hematology

## 2021-02-02 ENCOUNTER — Inpatient Hospital Stay (HOSPITAL_COMMUNITY): Payer: Medicare Other

## 2021-02-02 ENCOUNTER — Ambulatory Visit: Payer: Medicare Other | Admitting: Urology

## 2021-02-02 VITALS — BP 92/58 | HR 68 | Temp 97.9°F | Resp 18 | Wt 132.3 lb

## 2021-02-02 VITALS — BP 131/68 | HR 67 | Temp 97.5°F | Resp 18

## 2021-02-02 DIAGNOSIS — Z9221 Personal history of antineoplastic chemotherapy: Secondary | ICD-10-CM | POA: Diagnosis not present

## 2021-02-02 DIAGNOSIS — Z5112 Encounter for antineoplastic immunotherapy: Secondary | ICD-10-CM | POA: Insufficient documentation

## 2021-02-02 DIAGNOSIS — Z85810 Personal history of malignant neoplasm of tongue: Secondary | ICD-10-CM | POA: Diagnosis not present

## 2021-02-02 DIAGNOSIS — N2889 Other specified disorders of kidney and ureter: Secondary | ICD-10-CM

## 2021-02-02 DIAGNOSIS — Z923 Personal history of irradiation: Secondary | ICD-10-CM | POA: Diagnosis not present

## 2021-02-02 DIAGNOSIS — M25562 Pain in left knee: Secondary | ICD-10-CM | POA: Insufficient documentation

## 2021-02-02 DIAGNOSIS — M25561 Pain in right knee: Secondary | ICD-10-CM | POA: Diagnosis not present

## 2021-02-02 DIAGNOSIS — C109 Malignant neoplasm of oropharynx, unspecified: Secondary | ICD-10-CM

## 2021-02-02 DIAGNOSIS — C61 Malignant neoplasm of prostate: Secondary | ICD-10-CM | POA: Insufficient documentation

## 2021-02-02 DIAGNOSIS — C3492 Malignant neoplasm of unspecified part of left bronchus or lung: Secondary | ICD-10-CM | POA: Insufficient documentation

## 2021-02-02 DIAGNOSIS — Z5111 Encounter for antineoplastic chemotherapy: Secondary | ICD-10-CM | POA: Diagnosis present

## 2021-02-02 DIAGNOSIS — N189 Chronic kidney disease, unspecified: Secondary | ICD-10-CM | POA: Insufficient documentation

## 2021-02-02 DIAGNOSIS — Z79890 Hormone replacement therapy (postmenopausal): Secondary | ICD-10-CM | POA: Diagnosis not present

## 2021-02-02 DIAGNOSIS — C7902 Secondary malignant neoplasm of left kidney and renal pelvis: Secondary | ICD-10-CM | POA: Diagnosis not present

## 2021-02-02 DIAGNOSIS — Z5189 Encounter for other specified aftercare: Secondary | ICD-10-CM | POA: Insufficient documentation

## 2021-02-02 DIAGNOSIS — R972 Elevated prostate specific antigen [PSA]: Secondary | ICD-10-CM

## 2021-02-02 DIAGNOSIS — Z87891 Personal history of nicotine dependence: Secondary | ICD-10-CM | POA: Diagnosis not present

## 2021-02-02 DIAGNOSIS — E039 Hypothyroidism, unspecified: Secondary | ICD-10-CM | POA: Diagnosis not present

## 2021-02-02 LAB — CBC WITH DIFFERENTIAL/PLATELET
Abs Immature Granulocytes: 0.01 10*3/uL (ref 0.00–0.07)
Basophils Absolute: 0 10*3/uL (ref 0.0–0.1)
Basophils Relative: 1 %
Eosinophils Absolute: 0.1 10*3/uL (ref 0.0–0.5)
Eosinophils Relative: 4 %
HCT: 33.6 % — ABNORMAL LOW (ref 39.0–52.0)
Hemoglobin: 11.1 g/dL — ABNORMAL LOW (ref 13.0–17.0)
Immature Granulocytes: 0 %
Lymphocytes Relative: 21 %
Lymphs Abs: 0.7 10*3/uL (ref 0.7–4.0)
MCH: 30.7 pg (ref 26.0–34.0)
MCHC: 33 g/dL (ref 30.0–36.0)
MCV: 93.1 fL (ref 80.0–100.0)
Monocytes Absolute: 0.7 10*3/uL (ref 0.1–1.0)
Monocytes Relative: 22 %
Neutro Abs: 1.7 10*3/uL (ref 1.7–7.7)
Neutrophils Relative %: 52 %
Platelets: 221 10*3/uL (ref 150–400)
RBC: 3.61 MIL/uL — ABNORMAL LOW (ref 4.22–5.81)
RDW: 15.6 % — ABNORMAL HIGH (ref 11.5–15.5)
WBC: 3.2 10*3/uL — ABNORMAL LOW (ref 4.0–10.5)
nRBC: 0 % (ref 0.0–0.2)

## 2021-02-02 LAB — COMPREHENSIVE METABOLIC PANEL
ALT: 17 U/L (ref 0–44)
AST: 19 U/L (ref 15–41)
Albumin: 3.4 g/dL — ABNORMAL LOW (ref 3.5–5.0)
Alkaline Phosphatase: 63 U/L (ref 38–126)
Anion gap: 11 (ref 5–15)
BUN: 35 mg/dL — ABNORMAL HIGH (ref 8–23)
CO2: 27 mmol/L (ref 22–32)
Calcium: 8.7 mg/dL — ABNORMAL LOW (ref 8.9–10.3)
Chloride: 106 mmol/L (ref 98–111)
Creatinine, Ser: 1.82 mg/dL — ABNORMAL HIGH (ref 0.61–1.24)
GFR, Estimated: 39 mL/min — ABNORMAL LOW (ref >60)
Glucose, Bld: 75 mg/dL (ref 70–99)
Potassium: 4.8 mmol/L (ref 3.5–5.1)
Sodium: 144 mmol/L (ref 135–145)
Total Bilirubin: 0.4 mg/dL (ref 0.3–1.2)
Total Protein: 6.7 g/dL (ref 6.5–8.1)

## 2021-02-02 LAB — TSH: TSH: 2.521 u[IU]/mL (ref 0.350–4.500)

## 2021-02-02 MED ORDER — SODIUM CHLORIDE 0.9 % IV SOLN
175.0000 mg/m2 | Freq: Once | INTRAVENOUS | Status: AC
  Administered 2021-02-02: 330 mg via INTRAVENOUS
  Filled 2021-02-02: qty 55

## 2021-02-02 MED ORDER — SODIUM CHLORIDE 0.9 % IV SOLN
300.0000 mg | Freq: Once | INTRAVENOUS | Status: AC
  Administered 2021-02-02: 300 mg via INTRAVENOUS
  Filled 2021-02-02: qty 30

## 2021-02-02 MED ORDER — DIPHENHYDRAMINE HCL 50 MG/ML IJ SOLN
50.0000 mg | Freq: Once | INTRAMUSCULAR | Status: AC
  Administered 2021-02-02: 50 mg via INTRAVENOUS
  Filled 2021-02-02: qty 1

## 2021-02-02 MED ORDER — SODIUM CHLORIDE 0.9 % IV SOLN: Freq: Once | INTRAVENOUS | Status: AC

## 2021-02-02 MED ORDER — HEPARIN SOD (PORK) LOCK FLUSH 100 UNIT/ML IV SOLN
500.0000 [IU] | Freq: Once | INTRAVENOUS | Status: AC | PRN
  Administered 2021-02-02: 500 [IU]

## 2021-02-02 MED ORDER — HYDROCODONE-ACETAMINOPHEN 5-325 MG PO TABS: 1.0000 | ORAL_TABLET | Freq: Two times a day (BID) | ORAL | 0 refills | Status: DC | PRN

## 2021-02-02 MED ORDER — SODIUM CHLORIDE 0.9 % IV SOLN
20.0000 mg | Freq: Once | INTRAVENOUS | Status: AC
  Administered 2021-02-02: 20 mg via INTRAVENOUS
  Filled 2021-02-02: qty 20

## 2021-02-02 MED ORDER — PALONOSETRON HCL INJECTION 0.25 MG/5ML
0.2500 mg | Freq: Once | INTRAVENOUS | Status: AC
  Administered 2021-02-02: 0.25 mg via INTRAVENOUS
  Filled 2021-02-02: qty 5

## 2021-02-02 MED ORDER — SODIUM CHLORIDE 0.9 % IV SOLN
200.0000 mg | Freq: Once | INTRAVENOUS | Status: AC
  Administered 2021-02-02: 200 mg via INTRAVENOUS
  Filled 2021-02-02: qty 8

## 2021-02-02 MED ORDER — FAMOTIDINE 20 MG IN NS 100 ML IVPB
20.0000 mg | Freq: Once | INTRAVENOUS | Status: AC
  Administered 2021-02-02: 20 mg via INTRAVENOUS
  Filled 2021-02-02: qty 20

## 2021-02-02 MED ORDER — SODIUM CHLORIDE 0.9% FLUSH
10.0000 mL | INTRAVENOUS | Status: DC | PRN
  Administered 2021-02-02: 10 mL

## 2021-02-02 NOTE — Patient Instructions (Signed)
[place]  Discharge Instructions: Thank you for choosing [facility] to provide your oncology and hematology care.  If you have a lab appointment with the [facility], please come in thru the Main Entrance and check in at the main information desk.  Wear comfortable clothing and clothing appropriate for easy access to any Portacath or PICC line.   We strive to give you quality time with your provider. You may need to reschedule your appointment if you arrive late (15 or more minutes).  Arriving late affects you and other patients whose appointments are after yours.  Also, if you miss three or more appointments without notifying the office, you may be dismissed from the clinic at the provider's discretion.      For prescription refill requests, have your pharmacy contact our office and allow 72 hours for refills to be completed.    Today you received the following chemotherapy and/or immunotherapy agents Carbo,Taxol, and Keytruda.   To help prevent nausea and vomiting after your treatment, we encourage you to take your nausea medication as directed.  BELOW ARE SYMPTOMS THAT SHOULD BE REPORTED IMMEDIATELY: *FEVER GREATER THAN 100.4 F (38 C) OR HIGHER *CHILLS OR SWEATING *NAUSEA AND VOMITING THAT IS NOT CONTROLLED WITH YOUR NAUSEA MEDICATION *UNUSUAL SHORTNESS OF BREATH *UNUSUAL BRUISING OR BLEEDING *URINARY PROBLEMS (pain or burning when urinating, or frequent urination) *BOWEL PROBLEMS (unusual diarrhea, constipation, pain near the anus) TENDERNESS IN MOUTH AND THROAT WITH OR WITHOUT PRESENCE OF ULCERS (sore throat, sores in mouth, or a toothache) UNUSUAL RASH, SWELLING OR PAIN  UNUSUAL VAGINAL DISCHARGE OR ITCHING   Items with * indicate a potential emergency and should be followed up as soon as possible or go to the Emergency Department if any problems should occur.  Please show the CHEMOTHERAPY ALERT CARD or IMMUNOTHERAPY ALERT CARD at check-in to the  Emergency Department and triage nurse.  Should you have questions after your visit or need to cancel or reschedule your appointment, please contact [facility] [phone]  and follow the prompts.  Office hours are 8:00 a.m. to 4:30 p.m. Monday - Friday. Please note that voicemails left after 4:00 p.m. may not be returned until the following business day.  We are closed weekends and major holidays. You have access to a nurse at all times for urgent questions. Please call the main number to the clinic [phone] and follow the prompts.  For any non-urgent questions, you may also contact your provider using MyChart. We now offer e-Visits for anyone 71 and older to request care online for non-urgent symptoms. For details visit [email or web address].   Also download the MyChart app! Go to the app store, search "MyChart", open the app, select [facility], and log in with your MyChart username and password.  Due to Covid, a mask is required upon entering the hospital/clinic. If you do not have a mask, one will be given to you upon arrival. For doctor visits, patients may have 1 support person aged 71 or older with them. For treatment visits, patients cannot have anyone with them due to current Covid guidelines and our immunocompromised population.

## 2021-02-02 NOTE — Progress Notes (Signed)
Pt presents for treatment per provider's order. Vital signs and labs WNL for treatment and pt voiced no new complaints at this time. Pt is good for tx creatinine 1.82 okay to proceed with treatment per Dr.K.  Taxol, Sharol Given given today per MD orders. Tolerated infusion without adverse affects. Vital signs stable. No complaints at this time. Discharged from clinic ambulatory in stable condition. Alert and oriented x 3. F/U with [facility] as scheduled.

## 2021-02-02 NOTE — Progress Notes (Signed)
Patient has been examined, vital signs and labs have been reviewed by Dr. Delton Coombes. ANC, Creatinine, LFTs, hemoglobin, and platelets are within treatment parameters per Dr. Delton Coombes. Patient may proceed with treatment per M.D.

## 2021-02-02 NOTE — Progress Notes (Signed)
Patients port flushed without difficulty.  Good blood return noted with no bruising or swelling noted at site.  Stable during access and blood draw.  Patient to remain accessed for treatment. 

## 2021-02-02 NOTE — Patient Instructions (Signed)
[place] at [facility] Discharge Instructions  You were seen and examined today by Dr. Delton Coombes. We will proceed with cycle 2 of treatment today. Return as scheduled in 3 weeks for lab work, office visit, and treatment.   Thank you for choosing [place] at [facility] to provide your oncology and hematology care.  To afford each patient quality time with our provider, please arrive at least 15 minutes before your scheduled appointment time.   If you have a lab appointment with the [facility] please come in thru the Main Entrance and check in at the main information desk.  You need to re-schedule your appointment should you arrive 10 or more minutes late.  We strive to give you quality time with our providers, and arriving late affects you and other patients whose appointments are after yours.  Also, if you no show three or more times for appointments you may be dismissed from the clinic at the providers discretion.     Again, thank you for choosing [facility].  Our hope is that these requests will decrease the amount of time that you wait before being seen by our physicians.       _____________________________________________________________  Should you have questions after your visit to [facility], please contact our office at [phone] and follow the prompts.  Our office hours are 8:00 a.m. and 4:30 p.m. Monday - Friday.  Please note that voicemails left after 4:00 p.m. may not be returned until the following business day.  We are closed weekends and major holidays.  You do have access to a nurse 24-7, just call the main number to the clinic [phone] and do not press any options, hold on the line and a nurse will answer the phone.    For prescription refill requests, have your pharmacy contact our office and allow 72 hours.    Due to Covid, you will need to wear a mask upon entering the hospital. If you  do not have a mask, a mask will be given to you at the Main Entrance upon arrival. For doctor visits, patients may have 1 support person age 42 or older with them. For treatment visits, patients can not have anyone with them due to social distancing guidelines and our immunocompromised population.

## 2021-02-04 ENCOUNTER — Inpatient Hospital Stay (HOSPITAL_COMMUNITY): Payer: Medicare Other

## 2021-02-04 ENCOUNTER — Other Ambulatory Visit: Payer: Self-pay

## 2021-02-04 ENCOUNTER — Encounter (HOSPITAL_COMMUNITY): Payer: Self-pay

## 2021-02-04 VITALS — BP 90/65 | HR 86 | Temp 97.9°F | Resp 18

## 2021-02-04 DIAGNOSIS — C109 Malignant neoplasm of oropharynx, unspecified: Secondary | ICD-10-CM

## 2021-02-04 DIAGNOSIS — Z5112 Encounter for antineoplastic immunotherapy: Secondary | ICD-10-CM | POA: Diagnosis not present

## 2021-02-04 MED ORDER — PEGFILGRASTIM-BMEZ 6 MG/0.6ML ~~LOC~~ SOSY
6.0000 mg | PREFILLED_SYRINGE | Freq: Once | SUBCUTANEOUS | Status: AC
  Administered 2021-02-04: 6 mg via SUBCUTANEOUS
  Filled 2021-02-04: qty 0.6

## 2021-02-04 NOTE — Patient Instructions (Signed)
[place]  Discharge Instructions: Thank you for choosing [facility] to provide your oncology and hematology care.  If you have a lab appointment with the [facility], please come in thru the Main Entrance and check in at the main information desk.  Wear comfortable clothing and clothing appropriate for easy access to any Portacath or PICC line.   We strive to give you quality time with your provider. You may need to reschedule your appointment if you arrive late (15 or more minutes).  Arriving late affects you and other patients whose appointments are after yours.  Also, if you miss three or more appointments without notifying the office, you may be dismissed from the clinic at the provider's discretion.      For prescription refill requests, have your pharmacy contact our office and allow 72 hours for refills to be completed.    Today you received the following: Ziextenzo injection.       To help prevent nausea and vomiting after your treatment, we encourage you to take your nausea medication as directed.  BELOW ARE SYMPTOMS THAT SHOULD BE REPORTED IMMEDIATELY: *FEVER GREATER THAN 100.4 F (38 C) OR HIGHER *CHILLS OR SWEATING *NAUSEA AND VOMITING THAT IS NOT CONTROLLED WITH YOUR NAUSEA MEDICATION *UNUSUAL SHORTNESS OF BREATH *UNUSUAL BRUISING OR BLEEDING *URINARY PROBLEMS (pain or burning when urinating, or frequent urination) *BOWEL PROBLEMS (unusual diarrhea, constipation, pain near the anus) TENDERNESS IN MOUTH AND THROAT WITH OR WITHOUT PRESENCE OF ULCERS (sore throat, sores in mouth, or a toothache) UNUSUAL RASH, SWELLING OR PAIN  UNUSUAL VAGINAL DISCHARGE OR ITCHING   Items with * indicate a potential emergency and should be followed up as soon as possible or go to the Emergency Department if any problems should occur.  Please show the CHEMOTHERAPY ALERT CARD or IMMUNOTHERAPY ALERT CARD at check-in to the Emergency Department and triage  nurse.  Should you have questions after your visit or need to cancel or reschedule your appointment, please contact [facility] [phone]  and follow the prompts.  Office hours are 8:00 a.m. to 4:30 p.m. Monday - Friday. Please note that voicemails left after 4:00 p.m. may not be returned until the following business day.  We are closed weekends and major holidays. You have access to a nurse at all times for urgent questions. Please call the main number to the clinic [phone] and follow the prompts.  For any non-urgent questions, you may also contact your provider using MyChart. We now offer e-Visits for anyone 10 and older to request care online for non-urgent symptoms. For details visit [email or web address].   Also download the MyChart app! Go to the app store, search "MyChart", open the app, select [facility], and log in with your MyChart username and password.  Due to Covid, a mask is required upon entering the hospital/clinic. If you do not have a mask, one will be given to you upon arrival. For doctor visits, patients may have 1 support person aged 71 or older with them. For treatment visits, patients cannot have anyone with them due to current Covid guidelines and our immunocompromised population.

## 2021-02-04 NOTE — Progress Notes (Signed)
Alexander [name] presents today for injection per the provider's orders.  Ziextenzo 6 mg administration without incident; injection site WNL; see MAR for injection details.  Patient tolerated procedure well and without incident.  No questions or complaints noted at this time. Discharged from clinic ambulatory in stable condition. Alert and oriented x 3. F/U with [facility] as scheduled.

## 2021-02-16 ENCOUNTER — Encounter: Payer: Self-pay | Admitting: Urology

## 2021-02-16 ENCOUNTER — Ambulatory Visit (INDEPENDENT_AMBULATORY_CARE_PROVIDER_SITE_OTHER): Payer: Medicare Other | Admitting: Urology

## 2021-02-16 ENCOUNTER — Other Ambulatory Visit: Payer: Self-pay

## 2021-02-16 VITALS — BP 111/64 | HR 78

## 2021-02-16 DIAGNOSIS — N2889 Other specified disorders of kidney and ureter: Secondary | ICD-10-CM

## 2021-02-16 DIAGNOSIS — R351 Nocturia: Secondary | ICD-10-CM

## 2021-02-16 DIAGNOSIS — N5201 Erectile dysfunction due to arterial insufficiency: Secondary | ICD-10-CM | POA: Diagnosis not present

## 2021-02-16 LAB — BLADDER SCAN AMB NON-IMAGING: PVR: 4 WU

## 2021-02-16 MED ORDER — SILODOSIN 8 MG PO CAPS: 8.0000 mg | ORAL_CAPSULE | Freq: Every day | ORAL | 11 refills | Status: DC

## 2021-02-16 MED ORDER — TADALAFIL 20 MG PO TABS: 20.0000 mg | ORAL_TABLET | Freq: Every day | ORAL | 5 refills | Status: DC | PRN

## 2021-02-16 NOTE — Progress Notes (Signed)
02/16/2021 4:14 PM   Alexander [name] [place] December 28, 1950 [id number]  Referring provider: Lemmie Evens, MD [place],  [place] [address]  Followup BPH and kidney mass   HPI: [name] is a 70yo here for followup for BPH and a left renal mass. 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 Last visit we started raaflo 8mg  for severe LUTS and the patient has noted a significant improvement on therapy. IPSS 19 QOL 3 on rapaflo. Nocturia improved to 1x from 4-5x.  He is requesting medication for ED. For 2 years he has noted difficulty getting and maintaining his erections. No prior PDE5s. Good exercise tolerance.    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 3 weeks ago.- Dr. Oneal [name] center [place], [place].   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   SCCa of Left BOT    Surgical History: Past Surgical History:  Procedure Laterality Date   BIOPSY  01/15/2018   Procedure: BIOPSY;  Surgeon: Alexander Binder, MD;  [facility];  Service: Endoscopy;;  gastric   COLONOSCOPY N/A 03/13/2016   Procedure: COLONOSCOPY;  Surgeon: Alexander Binder, MD;  [facility];  Service: Endoscopy;  Laterality: N/A;  2:15 PM   ESOPHAGOGASTRODUODENOSCOPY (EGD) WITH PROPOFOL N/A 08/17/2014   Procedure: ESOPHAGOGASTRODUODENOSCOPY (EGD) WITH PROPOFOL (procedure #1);  Surgeon: Alexander Signs Md, MD;  Location: [facility] ORS;  Service: General;  Laterality: N/A;   ESOPHAGOGASTRODUODENOSCOPY (EGD) WITH PROPOFOL N/A 01/15/2018   Procedure: ESOPHAGOGASTRODUODENOSCOPY (EGD) WITH PROPOFOL;  Surgeon: Alexander Binder, MD;  [facility];  Service: Endoscopy;  Laterality: N/A;  9:30am   MULTIPLE EXTRACTIONS WITH ALVEOLOPLASTY N/A 08/12/2014   Procedure: Extraction of tooth #'s  6,17,22,23,24,25,26,27 with alveoloplasty;  Surgeon: Alexander [name], DDS;  Location: [facility];  Service: Oral Surgery;  Laterality: N/A;   PANENDOSCOPY N/A 08/06/2014   Procedure: PANENDOSCOPY WITH BIOPSY;  Surgeon: Alexander Baptist, MD;  Location: [facility];  Service: ENT;  Laterality: N/A;   PEG PLACEMENT Left 08/17/14   PEG PLACEMENT N/A 08/17/2014   Procedure: PERCUTANEOUS ENDOSCOPIC GASTROSTOMY (PEG) PLACEMENT (procedure #1);  Surgeon: Alexander Signs Md, MD;  Location: [facility] OR[facility];  Service: General;  Laterality: N/A;   PORT-A-CATH REMOVAL Right 07/17/2016   Procedure: MINOR REMOVAL PORT-A-CATH;  Surgeon: Alexander Signs, MD;  Location: [facility];  Service: General;  Laterality: Right;   PORTACATH PLACEMENT Right 08/17/14   PORTACATH PLACEMENT Right 08/17/2014   Procedure: INSERTION PORT-A-CATH (procedure #2);  Surgeon: Alexander Signs Md, MD;  Location: [facility];  Service: General;  Laterality: Right;   PORTACATH PLACEMENT Left 04/26/2018   Procedure: INSERTION PORT-A-CATH (attached catheter in left subclavian);  Surgeon: Alexander Signs, MD;  Location: [facility];  Service: General;  Laterality: Left;   SAVORY DILATION N/A 01/15/2018   Procedure: SAVORY DILATION;  Surgeon: Alexander Binder, MD;  [facility];  Service: Endoscopy;  Laterality: N/A;   VIDEO BRONCHOSCOPY WITH ENDOBRONCHIAL ULTRASOUND N/A 04/15/2018   Procedure: VIDEO BRONCHOSCOPY WITH ENDOBRONCHIAL ULTRASOUND;  Surgeon: Alexander Nakayama, MD;  Location: [facility] OR;  Service: Thoracic;  Laterality: N/A;    Home Medications:  Allergies as of 02/16/2021   No Known Allergies      Medication List        Accurate as  of February 16, 2021  4:14 PM. If you have any questions, ask your nurse or doctor.          feeding supplement Liqd Take 237 mLs by mouth 4 (four) times daily.   HYDROcodone-acetaminophen 5-325 MG tablet Commonly known as: NORCO/VICODIN Take 1 tablet by mouth every 12 (twelve) hours as needed for moderate pain.    KEYTRUDA IV Inject into the vein every 21 ( twenty-one) days.   levothyroxine 88 MCG tablet Commonly known as: Synthroid Take 1 tablet (88 mcg total) by mouth daily before breakfast.   Melatonin 10 MG Tabs Take 1 tablet by mouth at bedtime.   naproxen sodium 220 MG tablet Commonly known as: ALEVE Take 220 mg by mouth daily as needed.   omeprazole 20 MG capsule Commonly known as: PRILOSEC TAKE 1 CAPSULE BY MOUTH 30 MINUTES PRIOR TO BREAKFAST   silodosin 8 MG Caps capsule Commonly known as: RAPAFLO Take 1 capsule (8 mg total) by mouth daily with breakfast.        Allergies: No Known Allergies  Family History: Family History  Problem Relation Age of Onset   Colon cancer Neg Hx    Gastric cancer Neg Hx    Esophageal cancer Neg Hx     Social History:  reports that he quit smoking about 6 years ago. His smoking use included cigarettes. He has a 15.00 pack-year smoking history. He has never used smokeless tobacco. He reports that he does not currently use alcohol. He reports that he does not use drugs.  ROS: All other review of systems were reviewed and are negative except what is noted above in HPI  Physical Exam: BP 111/64   Pulse 78   Constitutional:  Alert and oriented, No acute distress. HEENT: Alexander [name] AT, moist mucus membranes.  Trachea midline, no masses. Cardiovascular: No clubbing, cyanosis, or edema. Respiratory: Normal respiratory effort, no increased work of breathing. GI: Abdomen is soft, nontender, nondistended, no abdominal masses GU: No CVA tenderness.  Lymph: No cervical or inguinal lymphadenopathy. Skin: No rashes, bruises or suspicious lesions. Neurologic: Grossly intact, no focal deficits, moving all 4 extremities. Psychiatric: Normal mood and affect.  Laboratory Data: Lab Results  Component Value Date   WBC 3.2 (L) 02/02/2021   HGB 11.1 (L) 02/02/2021   HCT 33.6 (L) 02/02/2021   MCV 93.1 02/02/2021   PLT 221 02/02/2021    Lab Results   Component Value Date   CREATININE 1.82 (H) 02/02/2021    No results found for: PSA  No results found for: TESTOSTERONE  No results found for: HGBA1C  Urinalysis    Component Value Date/Time   APPEARANCEUR Clear 12/15/2020 1008   GLUCOSEU Negative 12/15/2020 1008   BILIRUBINUR Negative 12/15/2020 1008   PROTEINUR Negative 12/15/2020 1008   NITRITE Negative 12/15/2020 1008   LEUKOCYTESUR Trace (A) 12/15/2020 1008    Lab Results  Component Value Date   LABMICR See below: 12/15/2020   WBCUA 0-5 12/15/2020   LABEPIT 0-10 12/15/2020   BACTERIA None seen 12/15/2020    Pertinent Imaging:  No results found for this or any previous visit.  No results found for this or any previous visit.  No results found for this or any previous visit.  No results found for this or any previous visit.  No results found for this or any previous visit.  No results found for this or any previous visit.  No results found for this or any previous visit.  No results found for this  or any previous visit.   Assessment & Plan:    1. Nocturia -Continue rapaflo 8mg  qhs - BLADDER SCAN AMB NON-IMAGING  2. Renal mass -management per Dr. Delton Coombes since this is metastatic squamous cell carcinoma  3. Erectile dysfunction due to arterial insufficiency -We will trial tadalafil 20mg  prn   No follow-ups on file.  [name], MD  [facility] [place]

## 2021-02-16 NOTE — Progress Notes (Signed)
post void residual =24ml

## 2021-02-16 NOTE — Progress Notes (Signed)
Urological Symptom Review  Patient is experiencing the following symptoms: Frequent urination Get up at night to urinate Stream starts and stops Weak stream Erection problems (male only)   Review of Systems  Gastrointestinal (upper)  : Negative for upper GI symptoms  Gastrointestinal (lower) : Negative for lower GI symptoms  Constitutional : Negative for symptoms  Skin: Negative for skin symptoms  Eyes: Blurred vision  Ear/Nose/Throat : Negative for Ear/Nose/Throat symptoms  Hematologic/Lymphatic: Negative for Hematologic/Lymphatic symptoms  Cardiovascular : Negative for cardiovascular symptoms  Respiratory : Negative for respiratory symptoms  Endocrine: Negative for endocrine symptoms  Musculoskeletal: Joint pain  Neurological: Negative for neurological symptoms  Psychologic: Negative for psychiatric symptoms

## 2021-02-22 ENCOUNTER — Encounter: Payer: Self-pay | Admitting: Urology

## 2021-02-22 NOTE — Patient Instructions (Signed)
Erectile Dysfunction °Erectile dysfunction (ED) is the inability to get or keep an erection in order to have sexual intercourse. ED is considered a symptom of an underlying disorder and is not considered a disease. ED may include: °Inability to get an erection. °Lack of enough hardness of the erection to allow penetration. °Loss of erection before sex is finished. °What are the causes? °This condition may be caused by: °Physical causes, such as: °Artery problems. This may include heart disease, high blood pressure, atherosclerosis, and diabetes. °Hormonal problems, such as low testosterone. °Obesity. °Nerve problems. This may include back or pelvic injuries, multiple sclerosis, Parkinson's disease, spinal cord injury, and stroke. °Certain medicines, such as: °Pain relievers. °Antidepressants. °Blood pressure medicines and water pills (diuretics). °Cancer medicines. °Antihistamines. °Muscle relaxants. °Lifestyle factors, such as: °Use of drugs such as marijuana, cocaine, or opioids. °Excessive use of alcohol. °Smoking. °Lack of physical activity or exercise. °Psychological causes, such as: °Anxiety or stress. °Sadness or depression. °Exhaustion. °Fear about sexual performance. °Guilt. °What are the signs or symptoms? °Symptoms of this condition include: °Inability to get an erection. °Lack of enough hardness of the erection to allow penetration. °Loss of the erection before sex is finished. °Sometimes having normal erections, but with frequent unsatisfactory episodes. °Low sexual satisfaction in either partner due to erection problems. °A curved penis occurring with erection. The curve may cause pain, or the penis may be too curved to allow for intercourse. °Never having nighttime or morning erections. °How is this diagnosed? °This condition is often diagnosed by: °Performing a physical exam to find other diseases or specific problems with the penis. °Asking you detailed questions about the problem. °Doing tests,  such as: °Blood tests to check for diabetes mellitus or high cholesterol, or to measure hormone levels. °Other tests to check for underlying health conditions. °An ultrasound exam to check for scarring. °A test to check blood flow to the penis. °Doing a sleep study at home to measure nighttime erections. °How is this treated? °This condition may be treated by: °Medicines, such as: °Medicine taken by mouth to help you achieve an erection (oral medicine). °Hormone replacement therapy to replace low testosterone levels. °Medicine that is injected into the penis. Your health care provider may instruct you how to give yourself these injections at home. °Medicine that is delivered with a short applicator tube. The tube is inserted into the opening at the tip of the penis, which is the opening of the urethra. A tiny pellet of medicine is put in the urethra. The pellet dissolves and enhances erectile function. This is also called MUSE (medicated urethral system for erections) therapy. °Vacuum pump. This is a pump with a ring on it. The pump and ring are placed on the penis and used to create pressure that helps the penis become erect. °Penile implant surgery. In this procedure, you may receive: °An inflatable implant. This consists of cylinders, a pump, and a reservoir. The cylinders can be inflated with a fluid that helps to create an erection, and they can be deflated after intercourse. °A semi-rigid implant. This consists of two silicone rubber rods. The rods provide some rigidity. They are also flexible, so the penis can both curve downward in its normal position and become straight for sexual intercourse. °Blood vessel surgery to improve blood flow to the penis. During this procedure, a blood vessel from a different part of the body is placed into the penis to allow blood to flow around (bypass) damaged or blocked blood vessels. °Lifestyle changes,   such as exercising more, losing weight, and quitting smoking. °Follow  these instructions at home: °Medicines ° °Take over-the-counter and prescription medicines only as told by your health care provider. Do not increase the dosage without first discussing it with your health care provider. °If you are using self-injections, do injections as directed by your health care provider. Make sure you avoid any veins that are on the surface of the penis. After giving an injection, apply pressure to the injection site for 5 minutes. °Talk to your health care provider about how to prevent headaches while taking ED medicines. These medicines may cause a sudden headache due to the increase in blood flow in your body. °General instructions °Exercise regularly, as directed by your health care provider. Work with your health care provider to lose weight, if needed. °Do not use any products that contain nicotine or tobacco. These products include cigarettes, chewing tobacco, and vaping devices, such as e-cigarettes. If you need help quitting, ask your health care provider. °Before using a vacuum pump, read the instructions that come with the pump and discuss any questions with your health care provider. °Keep all follow-up visits. This is important. °Contact a health care provider if: °You feel nauseous. °You are vomiting. °You get sudden headaches while taking ED medicines. °You have any concerns about your sexual health. °Get help right away if: °You are taking oral or injectable medicines and you have an erection that lasts longer than 4 hours. If your health care provider is unavailable, go to the nearest emergency room for evaluation. An erection that lasts much longer than 4 hours can result in permanent damage to your penis. °You have severe pain in your groin or abdomen. °You develop redness or severe swelling of your penis. °You have redness spreading at your groin or lower abdomen. °You are unable to urinate. °You experience chest pain or a rapid heartbeat (palpitations) after taking oral  medicines. °These symptoms may represent a serious problem that is an emergency. Do not wait to see if the symptoms will go away. Get medical help right away. Call your local emergency services (911 in the [place] Do not drive yourself to the hospital. °Summary °Erectile dysfunction (ED) is the inability to get or keep an erection during sexual intercourse. °This condition is diagnosed based on a physical exam, your symptoms, and tests to determine the cause. Treatment varies depending on the cause and may include medicines, hormone therapy, surgery, or a vacuum pump. °You may need follow-up visits to make sure that you are using your medicines or devices correctly. °Get help right away if you are taking or injecting medicines and you have an erection that lasts longer than 4 hours. °This information is not intended to replace advice given to you by your health care provider. Make sure you discuss any questions you have with your health care provider. °Document Revised: 06/09/2020 Document Reviewed: 06/09/2020 °Elsevier Patient Education © 2022 [employer] ° °

## 2021-02-22 NOTE — Progress Notes (Signed)
[name], [place] [address]   CLINIC:  Medical Oncology/Hematology  PCP:  Alexander Evens, MD [place]. 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   07/28/2014 Initial Diagnosis   Oropharyngeal cancer   08/03/2014 Imaging   CT CAP- L supraclavicular lymphadenopathy is not completely visualized. This is better seen on the previous neck CT from 07/27/2014. Otherwise, no evidence for metastatic disease in the chest, abdomen, or pelvis.   08/03/2014 Imaging   Bone scan- Uptake at adjacent anterior LEFT 6, 7, 8 ribs likely representing trauma/fractures. Questionable nonspecific increased tracer localization at the posterior RIGHT 8th and 9th ribs, the adjacent nature which raises a a question of trauma as well   08/06/2014 Pathology Results   Dr. Benjamine [name]- Oropharynx, biopsy, Left - INVASIVE SQUAMOUS CELL CARCINOMA.   08/12/2014 Procedure   Dr. Enrique [name]- 1. Multiple extraction of tooth numbers 6, 17, 22, 23, 24, 25, 26, and 27. 3 Quadrants of alveoloplasty   08/17/2014 Pathology Results   PORT and G-TUBE placed by Dr. Carlis Alexander [name].   08/26/2014 PET scan   Large hypermetabolic mass in the left base of tongue. Activity extends across midline to the right base tongue. 2. Intensely hypermetabolic left cervical metastatic lymph nodes. Lymph nodes extend from the left level II position to the left supraclavi   09/01/2014 - 09/22/2014 Chemotherapy   Concurrent chemoradiation with Cisplatin 100 mg/m2 x 2 cycles with Neulasta  support. Held cycle #3 d/t renal toxicity.    09/03/2014 - 10/23/2014 Radiation Therapy   Treated in Alexander Alexander [name], IMRT Alexander Alexander [name]).  Base of tongue and bilat neck. Total dose: 70 Gy in 35 fractions. (of note, he did miss several treatments requiring BID dosing towards the end of treatment).    01/25/2015 PET scan   Near complete resolution of metabolic activity at the base of tongue. Minimal residual activity is likely post treatment effect. 2. Complete resolution of metabolic activity above LEFT cervical lymph nodes. No evidence of residual metabolically active    2/77/4128 Procedure   Port-a-cath removed Alexander Alexander [name])    05/03/2018 - 08/23/2018 Chemotherapy   The patient had dexamethasone (DECADRON) 4 MG tablet, 8 mg, Oral, Daily, 1 of 1 cycle, Start date: 05/01/2018, End date: 10/23/2018 palonosetron (ALOXI) injection 0.25 mg, 0.25 mg, Intravenous,  Once, 6 of 6 cycles Administration: 0.25 mg (05/03/2018), 0.25 mg (05/24/2018), 0.25 mg (06/14/2018), 0.25 mg (07/09/2018), 0.25 mg (07/30/2018), 0.25 mg (08/21/2018) pegfilgrastim-cbqv (UDENYCA) injection 6 mg, 6 mg, Subcutaneous, Once, 5 of 5 cycles Administration: 6 mg (05/27/2018), 6 mg (06/17/2018), 6 mg (07/11/2018), 6 mg (08/01/2018), 6 mg (08/23/2018) CARBOplatin (PARAPLATIN) 380 mg in sodium chloride 0.9 % 250 mL chemo infusion, 380 mg (100 % of original dose 381 mg), Intravenous,  Once, 6 of 6 cycles Dose modification:   (original dose 381 mg, Cycle 1),   (original dose 309.5 mg, Cycle 2), 307.5 mg (original dose 309.5 mg, Cycle 5) Administration: 380 mg (05/03/2018), 310 mg (05/24/2018), 310 mg (06/14/2018), 340 mg (07/09/2018), 310 mg (07/30/2018), 350 mg (08/21/2018) PACLitaxel (TAXOL) 330 mg in sodium chloride 0.9 %  500 mL chemo infusion (> $RemoveBef'80mg'tCeQQnFLcy$ /m2), 175 mg/m2 = 330 mg (100 % of original dose 175 mg/m2), Intravenous,  Once, 6 of 6 cycles Dose modification: 175 mg/m2 (original dose 175 mg/m2, Cycle 1, Reason: Patient Age) Administration: 330 mg (05/03/2018), 330 mg (05/24/2018),  330 mg (06/14/2018), 330 mg (07/09/2018), 330 mg (07/30/2018), 330 mg (08/21/2018)   for chemotherapy treatment.     05/24/2018 -  Chemotherapy   Patient is on Treatment Plan : HEAD/NECK Pembrolizumab/Taxol/Carboplatin Q21D     Squamous cell lung cancer, left ([place])  06/14/2018 Initial Diagnosis   Squamous cell lung cancer, left ([facility])     CANCER STAGING:  Cancer Staging  Oropharyngeal carcinoma ([place]) Staging form: Pharynx - Oropharynx, AJCC 7th Edition - Clinical: Stage IVA (T4a, N2b, M0) - Unsigned   INTERVAL HISTORY:  Alexander Alexander [name], a 70 y.o. Alexander [name], returns for routine follow-up and consideration for next cycle of chemotherapy. Alexander Alexander [name] was last seen on 02/02/2021.  Due for cycle #46 of Keytruda today.   Overall, he tells me he has been feeling pretty well. 3 days following his last treatment he reports experiencing chills and rhinorrhea which resolved 3-4 days ago after taking Robitussin and Theraflu. He reports 1 sore on his tongue which makes eating painful, and because of this pain he is only eating soup and Ensure. He has lost 6 pounds since his last visit. He reports watery diarrhea intermittently every other day following eating; the last episode was 2 days ago. He reports 2 episodes of light headedness. He denies any falls or vomiting.   Overall, he does not feel ready for next cycle of chemo today.   REVIEW OF SYSTEMS:  Review of Systems  Constitutional:  Positive for appetite change (no appetite), fatigue (depleted) and unexpected weight change (-6 lbs). Chills: resolved. HENT:   Positive for mouth sores.   Gastrointestinal:  Positive for diarrhea. Negative for vomiting.  Musculoskeletal:  Positive for arthralgias (5/10 knee).  Neurological:  Positive for dizziness and light-headedness.  All other systems reviewed and are negative.  PAST MEDICAL/SURGICAL HISTORY:  Past Medical History:  Diagnosis Date   GERD (gastroesophageal reflux disease)    Mass of neck    dx.  oropharyngeal squamous cell carcinoma- Chemo. radiation planned   Oropharyngeal cancer ([facility]) 07/28/2014   dx. 3 weeks ago.- Dr. Oneal [name] [place], [place].   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[facility]) 08/06/2014   SCCa of Left BOT   Past Surgical History:  Procedure Laterality Date   BIOPSY  01/15/2018   Procedure: BIOPSY;  Surgeon: Danie Binder, MD;  [facility];  Service: Endoscopy;;  gastric   COLONOSCOPY N/A 03/13/2016   Procedure: COLONOSCOPY;  Surgeon: Danie Binder, MD;  [facility];  Service: Endoscopy;  Laterality: N/A;  2:15 PM   ESOPHAGOGASTRODUODENOSCOPY (EGD) WITH PROPOFOL N/A 08/17/2014   Procedure: ESOPHAGOGASTRODUODENOSCOPY (EGD) WITH PROPOFOL (procedure #1);  Surgeon: Aviva Signs Md, MD;  Location: [facility];  Service: General;  Laterality: N/A;   ESOPHAGOGASTRODUODENOSCOPY (EGD) WITH PROPOFOL N/A 01/15/2018   Procedure: ESOPHAGOGASTRODUODENOSCOPY (EGD) WITH PROPOFOL;  Surgeon: Danie Binder, MD;  [facility];  Service: Endoscopy;  Laterality: N/A;  9:30am   MULTIPLE EXTRACTIONS WITH ALVEOLOPLASTY N/A 08/12/2014   Procedure: Extraction of tooth #'s 6,17,22,23,24,25,26,27 with alveoloplasty;  Surgeon: Lenn Cal, DDS;  Location: [facility];  Service: Oral Surgery;  Laterality: N/A;   PANENDOSCOPY N/A 08/06/2014   Procedure: PANENDOSCOPY WITH BIOPSY;  Surgeon: Leta Baptist,  MD;  Location: [facility];  Service: ENT;  Laterality: N/A;   PEG PLACEMENT Left 08/17/14   PEG PLACEMENT N/A 08/17/2014   Procedure: PERCUTANEOUS ENDOSCOPIC GASTROSTOMY (PEG) PLACEMENT (procedure #1);  Surgeon: Franky Macho Md, MD;  Location: [facility];  Service: General;  Laterality: N/A;   PORT-A-CATH REMOVAL Right 07/17/2016   Procedure: MINOR REMOVAL PORT-A-CATH;  Surgeon: Franky Macho, MD;  Location: [facility];  Service: General;  Laterality: Right;   PORTACATH PLACEMENT Right 08/17/14   PORTACATH PLACEMENT Right 08/17/2014   Procedure: INSERTION  PORT-A-CATH (procedure #2);  Surgeon: Franky Macho Md, MD;  Location: [facility];  Service: General;  Laterality: Right;   PORTACATH PLACEMENT Left 04/26/2018   Procedure: INSERTION PORT-A-CATH (attached catheter in left subclavian);  Surgeon: Franky Macho, MD;  Location: [facility];  Service: General;  Laterality: Left;   SAVORY DILATION N/A 01/15/2018   Procedure: SAVORY DILATION;  Surgeon: West Bali, MD;  [facility];  Service: Endoscopy;  Laterality: N/A;   VIDEO BRONCHOSCOPY WITH ENDOBRONCHIAL ULTRASOUND N/A 04/15/2018   Procedure: VIDEO BRONCHOSCOPY WITH ENDOBRONCHIAL ULTRASOUND;  Surgeon: Loreli Slot, MD;  Location: [facility];  Service: Thoracic;  Laterality: N/A;    SOCIAL HISTORY:  Social History   Socioeconomic History   Marital status: Legally Separated    Spouse name: Not on file   Number of children: 5   Years of education: Not on file   Highest education level: Not on file  Occupational History   Not on file  Tobacco Use   Smoking status: Former    Packs/day: 0.50    Years: 30.00    Pack years: 15.00    Types: Cigarettes    Quit date: 07/22/2014    Years since quitting: 6.5   Smokeless tobacco: Never  Vaping Use   Vaping Use: Never used  Substance and Sexual Activity   Alcohol use: Not Currently    Alcohol/week: 0.0 standard drinks    Comment: None currently (11/09/17); previously 1-2 beers on the weekend   Drug use: No   Sexual activity: Not on file  Other Topics Concern   Not on file  Social History Narrative   Not on file   Social Determinants of Health   Financial Resource Strain: Not on file  Food Insecurity: Not on file  Transportation Needs: Not on file  Physical Activity: Not on file  Stress: Not on file  Social Connections: Not on file  Intimate Partner Violence: Not on file    FAMILY HISTORY:  Family History  Problem Relation Age of Onset   Colon cancer Neg Hx    Gastric cancer Neg Hx    Esophageal cancer Neg Hx      CURRENT MEDICATIONS:  Current Outpatient Medications  Medication Sig Dispense Refill   feeding supplement, ENSURE ENLIVE, (ENSURE ENLIVE) LIQD Take 237 mLs by mouth 4 (four) times daily.      HYDROcodone-acetaminophen (NORCO/VICODIN) 5-325 MG tablet Take 1 tablet by mouth every 12 (twelve) hours as needed for moderate pain. 60 tablet 0   levothyroxine (SYNTHROID) 88 MCG tablet Take 1 tablet (88 mcg total) by mouth daily before breakfast. 30 tablet 3   Melatonin 10 MG TABS Take 1 tablet by mouth at bedtime.     naproxen sodium (ALEVE) 220 MG tablet Take 220 mg by mouth daily as needed.      omeprazole (PRILOSEC) 20 MG capsule TAKE 1 CAPSULE BY MOUTH 30 MINUTES PRIOR TO BREAKFAST 90 capsule 1  Pembrolizumab (KEYTRUDA IV) Inject into the vein every 21 ( twenty-one) days.     silodosin (RAPAFLO) 8 MG CAPS capsule Take 1 capsule (8 mg total) by mouth daily with breakfast. 30 capsule 11   tadalafil (CIALIS) 20 MG tablet Take 1 tablet (20 mg total) by mouth daily as needed. 10 tablet 5   No current facility-administered medications for this visit.   Facility-Administered Medications Ordered in Other Visits  Medication Dose Route Frequency Provider Last Rate Last Admin   sodium chloride flush (NS) 0.9 % injection 10 mL  10 mL Intracatheter PRN Derek Jack, MD   10 mL at 06/24/19 6073    ALLERGIES:  No Known Allergies  PHYSICAL EXAM:  Performance status (ECOG): 1 - Symptomatic but completely ambulatory  There were no vitals filed for this visit. Wt Readings from Last 3 Encounters:  02/02/21 132 lb 4.8 oz (60 kg)  01/10/21 130 lb 1.6 oz (Alexander kg)  01/03/21 135 lb (61.2 kg)   Physical Exam Vitals reviewed.  Constitutional:      Appearance: Normal appearance.  HENT:     Mouth/Throat:     Tongue: Lesions (L lateral 5 mm ulcer) present.  Cardiovascular:     Rate and Rhythm: Normal rate and regular rhythm.     Pulses: Normal pulses.     Heart sounds: Normal heart sounds.   Pulmonary:     Effort: Pulmonary effort is normal.     Breath sounds: Normal breath sounds.  Neurological:     General: No focal deficit present.     Mental Status: He is alert and oriented to person, place, and time.  Psychiatric:        Mood and Affect: Mood normal.        Behavior: Behavior normal.    LABORATORY DATA:  I have reviewed the labs as listed.  CBC Latest Ref Rng & Units 02/02/2021 01/10/2021 01/03/2021  WBC 4.0 - 10.5 K/uL 3.2(L) 4.6 5.0  Hemoglobin 13.0 - 17.0 g/dL 11.1(L) 12.8(L) 13.7  Hematocrit 39.0 - 52.0 % 33.6(L) 39.6 42.6  Platelets 150 - 400 K/uL 221 179 202   CMP Latest Ref Rng & Units 02/02/2021 01/10/2021 11/30/2020  Glucose 70 - 99 mg/dL 75 104(H) 96  BUN 8 - 23 mg/dL 35(H) 31(H) 24(H)  Creatinine 0.61 - 1.24 mg/dL 1.82(H) 1.65(H) 1.82(H)  Sodium 135 - 145 mmol/L 144 139 138  Potassium 3.5 - 5.1 mmol/L 4.8 4.3 4.6  Chloride 98 - 111 mmol/L 106 106 105  CO2 22 - 32 mmol/L $RemoveB'27 27 27  'hlDapuKP$ Calcium 8.9 - 10.3 mg/dL 8.7(L) 9.1 8.6(L)  Total Protein 6.5 - 8.1 g/dL 6.7 7.0 6.7  Total Bilirubin 0.3 - 1.2 mg/dL 0.4 0.3 0.6  Alkaline Phos 38 - 126 U/L 63 57 48  AST 15 - 41 U/L $Remo'19 23 25  'DqAkw$ ALT 0 - 44 U/L $Remo'17 22 21    'CwjOu$ DIAGNOSTIC IMAGING:  I have independently reviewed the scans and discussed with the patient. No results found.   ASSESSMENT:  1.  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 -6 cycles of carboplatin, paclitaxel and pembrolizumab from 05/03/2018 through 08/21/2018. -Maintenance pembrolizumab started on 09/11/2018. -PET scan on 09/15/2019 showed interval decrease in hypermetabolic areas associated with tongue and floor of the mouth.  Hypermetabolic metastatic lymphadenopathy in the chest is Alexander [name].  No new sites seen. -PET scan on 03/15/2020 shows persistent, Alexander [name] hypermetabolism in the tongue/floor of mouth.  Slight interval decrease in hypermetabolism with mediastinal/hilar  adenopathy.  Persistent hypermetabolic focus in the right supraclavicular region.  New focus of hypermetabolic them identified in the right external iliac chain of pelvis with no discernible adenopathy on the CT. -CT CAP on 11/24/2020 showed left lower lobe lung nodule measuring 1.3 x 1.1 cm, previously 1.0 x 0.9 cm on CT scan from June.  Lesion was negative on PSMA PET scan.  Alexander [name] mediastinal lymph nodes. 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 - Cycle 1 of carboplatin, paclitaxel and pembrolizumab on 01/12/2021.   2.  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   PLAN:  1.  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 - 3 to 4 days after chemotherapy he developed chills and sniffles.  Denied any fever.  He took Robitussin and TheraFlu.  He also reported soreness in the left lateral tongue.  As result he could not eat much.  He also had diarrhea once every couple of days, watery, mostly after eating.  He felt lightheaded for couple of times.  Last diarrhea was 2 days ago. - He lives by himself and is able to do all his ADLs and IADLs. - I will hold off on his treatment today.  I have reviewed his labs which showed renal insufficiency which is acutely exacerbated most likely from dehydration.  Liver enzymes are normal except elevated total bilirubin of 1.9. - Would recommend IV hydration today and tomorrow.  We will plan to repeat BMP tomorrow.  We will give lidocaine gel for the tongue. - Reevaluate in 1 week for possible treatment.   2.  Hypothyroidism: - His TSH has gone up to 10.2 from 2.5 previously. - He is taking Synthroid 88 mcg.  We will closely monitor.   3.  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   4.   Bilateral knee pains: - Continue hydrocodone as needed for knee pains.   5.  Nutrition: - He lost 6 pounds in the last 3 weeks.  He is drinking soups and boost.  Eating has come down.   6.  CKD: - Baseline creatinine of 1.5-1.8. - Today creatinine is 3.13.  We will give hydration today and tomorrow.   7.  Prostate cancer: - Evaluated by Dr. Alyson Ingles biopsy was recommended.  Patient did not want biopsy.  8.  Left kidney mass: - Biopsy of the left kidney mass was consistent with metastatic squamous cell carcinoma.   Orders placed this encounter:  Orders Placed This Encounter  Procedures   CT CHEST [place], MD [place] [phone]   I, Thana Ates, am acting as a scribe for Dr. Derek Jack.  I, Derek Jack MD, have reviewed the above documentation for accuracy and completeness, and I agree with the above.

## 2021-02-23 ENCOUNTER — Other Ambulatory Visit: Payer: Self-pay

## 2021-02-23 ENCOUNTER — Inpatient Hospital Stay (HOSPITAL_BASED_OUTPATIENT_CLINIC_OR_DEPARTMENT_OTHER): Payer: Medicare Other | Admitting: Hematology

## 2021-02-23 ENCOUNTER — Inpatient Hospital Stay (HOSPITAL_COMMUNITY): Payer: Medicare Other

## 2021-02-23 VITALS — BP 73/45 | HR 68 | Temp 96.0°F | Resp 18 | Wt 126.0 lb

## 2021-02-23 VITALS — BP 96/56

## 2021-02-23 DIAGNOSIS — R972 Elevated prostate specific antigen [PSA]: Secondary | ICD-10-CM

## 2021-02-23 DIAGNOSIS — C3492 Malignant neoplasm of unspecified part of left bronchus or lung: Secondary | ICD-10-CM

## 2021-02-23 DIAGNOSIS — Z95828 Presence of other vascular implants and grafts: Secondary | ICD-10-CM

## 2021-02-23 DIAGNOSIS — N2889 Other specified disorders of kidney and ureter: Secondary | ICD-10-CM | POA: Diagnosis not present

## 2021-02-23 DIAGNOSIS — Z5112 Encounter for antineoplastic immunotherapy: Secondary | ICD-10-CM | POA: Diagnosis not present

## 2021-02-23 LAB — CBC WITH DIFFERENTIAL/PLATELET
Abs Immature Granulocytes: 0.03 10*3/uL (ref 0.00–0.07)
Basophils Absolute: 0 10*3/uL (ref 0.0–0.1)
Basophils Relative: 1 %
Eosinophils Absolute: 0.1 10*3/uL (ref 0.0–0.5)
Eosinophils Relative: 1 %
HCT: 32.3 % — ABNORMAL LOW (ref 39.0–52.0)
Hemoglobin: 10.5 g/dL — ABNORMAL LOW (ref 13.0–17.0)
Immature Granulocytes: 1 %
Lymphocytes Relative: 13 %
Lymphs Abs: 0.8 10*3/uL (ref 0.7–4.0)
MCH: 30.1 pg (ref 26.0–34.0)
MCHC: 32.5 g/dL (ref 30.0–36.0)
MCV: 92.6 fL (ref 80.0–100.0)
Monocytes Absolute: 1 10*3/uL (ref 0.1–1.0)
Monocytes Relative: 16 %
Neutro Abs: 4.4 10*3/uL (ref 1.7–7.7)
Neutrophils Relative %: 68 %
Platelets: 201 10*3/uL (ref 150–400)
RBC: 3.49 MIL/uL — ABNORMAL LOW (ref 4.22–5.81)
RDW: 16 % — ABNORMAL HIGH (ref 11.5–15.5)
WBC: 6.4 10*3/uL (ref 4.0–10.5)
nRBC: 0 % (ref 0.0–0.2)

## 2021-02-23 LAB — COMPREHENSIVE METABOLIC PANEL
ALT: 17 U/L (ref 0–44)
AST: 23 U/L (ref 15–41)
Albumin: 3.3 g/dL — ABNORMAL LOW (ref 3.5–5.0)
Alkaline Phosphatase: 62 U/L (ref 38–126)
Anion gap: 12 (ref 5–15)
BUN: 43 mg/dL — ABNORMAL HIGH (ref 8–23)
CO2: 24 mmol/L (ref 22–32)
Calcium: 8.8 mg/dL — ABNORMAL LOW (ref 8.9–10.3)
Chloride: 100 mmol/L (ref 98–111)
Creatinine, Ser: 3.13 mg/dL — ABNORMAL HIGH (ref 0.61–1.24)
GFR, Estimated: 20 mL/min — ABNORMAL LOW (ref >60)
Glucose, Bld: 91 mg/dL (ref 70–99)
Potassium: 4.3 mmol/L (ref 3.5–5.1)
Sodium: 136 mmol/L (ref 135–145)
Total Bilirubin: 1.9 mg/dL — ABNORMAL HIGH (ref 0.3–1.2)
Total Protein: 6.9 g/dL (ref 6.5–8.1)

## 2021-02-23 LAB — TSH: TSH: 10.21 u[IU]/mL — ABNORMAL HIGH (ref 0.350–4.500)

## 2021-02-23 LAB — SAMPLE TO BLOOD BANK

## 2021-02-23 LAB — MAGNESIUM: Magnesium: 2.3 mg/dL (ref 1.7–2.4)

## 2021-02-23 MED ORDER — SODIUM CHLORIDE 0.9 % IV SOLN: INTRAVENOUS | Status: DC

## 2021-02-23 MED ORDER — HEPARIN SOD (PORK) LOCK FLUSH 100 UNIT/ML IV SOLN
500.0000 [IU] | Freq: Once | INTRAVENOUS | Status: AC
  Administered 2021-02-23: 500 [IU] via INTRAVENOUS

## 2021-02-23 MED ORDER — SODIUM CHLORIDE 0.9% FLUSH
10.0000 mL | Freq: Once | INTRAVENOUS | Status: AC
  Administered 2021-02-23: 10 mL via INTRAVENOUS

## 2021-02-23 NOTE — Progress Notes (Signed)
Patients port flushed without difficulty.  Good blood return noted with no bruising or swelling noted at site.  Stable during access and blood draw.  Patient to remain accessed for treatment. 

## 2021-02-23 NOTE — Progress Notes (Signed)
Patient presents today for treatment. Patient's blood pressure 73/45, 66/40, 64/44, patient states he has not been drinking much because of a sore in his mouth that he developed 3 days ago. Patient reports weakness, and dizziness when standing. Dr. Delton Coombes notified and received ordered for Normal Saline bolus. Patient assessed by Dr. Delton Coombes, no treatment today per MD, patient will return tomorrow for fluids and recheck BMP.   Patient tolerated saline bolus. Port flushed with good blood return noted. No bruising or swelling at site. Bandaid applied and patient discharged in satisfactory condition. VVS stable with no signs or symptoms of distressed noted.

## 2021-02-23 NOTE — Patient Instructions (Signed)
[facility]  Discharge Instructions: Thank you for choosing [facility] to provide your oncology and hematology care.  If you have a lab appointment with the [facility], please come in thru the Main Entrance and check in at the main information desk.  Wear comfortable clothing and clothing appropriate for easy access to any Portacath or PICC line.   We strive to give you quality time with your provider. You may need to reschedule your appointment if you arrive late (15 or more minutes).  Arriving late affects you and other patients whose appointments are after yours.  Also, if you miss three or more appointments without notifying the office, you may be dismissed from the clinic at the provider's discretion.      For prescription refill requests, have your pharmacy contact our office and allow 72 hours for refills to be completed.    Today you received the following hydration fluids. Return as scheduled.   To help prevent nausea and vomiting after your treatment, we encourage you to take your nausea medication as directed.  BELOW ARE SYMPTOMS THAT SHOULD BE REPORTED IMMEDIATELY: *FEVER GREATER THAN 100.4 F (38 C) OR HIGHER *CHILLS OR SWEATING *NAUSEA AND VOMITING THAT IS NOT CONTROLLED WITH YOUR NAUSEA MEDICATION *UNUSUAL SHORTNESS OF BREATH *UNUSUAL BRUISING OR BLEEDING *URINARY PROBLEMS (pain or burning when urinating, or frequent urination) *BOWEL PROBLEMS (unusual diarrhea, constipation, pain near the anus) TENDERNESS IN MOUTH AND THROAT WITH OR WITHOUT PRESENCE OF ULCERS (sore throat, sores in mouth, or a toothache) UNUSUAL RASH, SWELLING OR PAIN  UNUSUAL VAGINAL DISCHARGE OR ITCHING   Items with * indicate a potential emergency and should be followed up as soon as possible or go to the Emergency Department if any problems should occur.  Please show the CHEMOTHERAPY ALERT CARD or IMMUNOTHERAPY ALERT CARD at check-in to the Emergency Department and triage  nurse.  Should you have questions after your visit or need to cancel or reschedule your appointment, please contact [facility] [phone]  and follow the prompts.  Office hours are 8:00 a.m. to 4:30 p.m. Monday - Friday. Please note that voicemails left after 4:00 p.m. may not be returned until the following business day.  We are closed weekends and major holidays. You have access to a nurse at all times for urgent questions. Please call the main number to the clinic [phone] and follow the prompts.  For any non-urgent questions, you may also contact your provider using MyChart. We now offer e-Visits for anyone 66 and older to request care online for non-urgent symptoms. For details visit [email or web address].   Also download the MyChart app! Go to the app store, search "MyChart", open the app, select [facility], and log in with your MyChart username and password.  Due to Covid, a mask is required upon entering the hospital/clinic. If you do not have a mask, one will be given to you upon arrival. For doctor visits, patients may have 1 support person aged 58 or older with them. For treatment visits, patients cannot have anyone with them due to current Covid guidelines and our immunocompromised population.

## 2021-02-24 ENCOUNTER — Inpatient Hospital Stay (HOSPITAL_COMMUNITY): Payer: Medicare Other | Attending: Hematology

## 2021-02-24 ENCOUNTER — Ambulatory Visit (HOSPITAL_COMMUNITY): Payer: Medicare Other | Admitting: Dietician

## 2021-02-24 ENCOUNTER — Inpatient Hospital Stay (HOSPITAL_COMMUNITY): Payer: Medicare Other

## 2021-02-24 VITALS — BP 102/60 | HR 63 | Temp 97.4°F | Resp 18

## 2021-02-24 VITALS — BP 98/54 | HR 69 | Temp 97.5°F | Resp 18

## 2021-02-24 DIAGNOSIS — N189 Chronic kidney disease, unspecified: Secondary | ICD-10-CM | POA: Diagnosis not present

## 2021-02-24 DIAGNOSIS — C7902 Secondary malignant neoplasm of left kidney and renal pelvis: Secondary | ICD-10-CM | POA: Insufficient documentation

## 2021-02-24 DIAGNOSIS — Z923 Personal history of irradiation: Secondary | ICD-10-CM | POA: Diagnosis not present

## 2021-02-24 DIAGNOSIS — Z9221 Personal history of antineoplastic chemotherapy: Secondary | ICD-10-CM | POA: Diagnosis not present

## 2021-02-24 DIAGNOSIS — E039 Hypothyroidism, unspecified: Secondary | ICD-10-CM | POA: Insufficient documentation

## 2021-02-24 DIAGNOSIS — Z5189 Encounter for other specified aftercare: Secondary | ICD-10-CM | POA: Diagnosis not present

## 2021-02-24 DIAGNOSIS — Z87891 Personal history of nicotine dependence: Secondary | ICD-10-CM | POA: Insufficient documentation

## 2021-02-24 DIAGNOSIS — Z79890 Hormone replacement therapy (postmenopausal): Secondary | ICD-10-CM | POA: Diagnosis not present

## 2021-02-24 DIAGNOSIS — M25562 Pain in left knee: Secondary | ICD-10-CM | POA: Diagnosis not present

## 2021-02-24 DIAGNOSIS — Z85810 Personal history of malignant neoplasm of tongue: Secondary | ICD-10-CM | POA: Insufficient documentation

## 2021-02-24 DIAGNOSIS — R7989 Other specified abnormal findings of blood chemistry: Secondary | ICD-10-CM

## 2021-02-24 DIAGNOSIS — Z5111 Encounter for antineoplastic chemotherapy: Secondary | ICD-10-CM | POA: Insufficient documentation

## 2021-02-24 DIAGNOSIS — M25561 Pain in right knee: Secondary | ICD-10-CM | POA: Insufficient documentation

## 2021-02-24 DIAGNOSIS — C3492 Malignant neoplasm of unspecified part of left bronchus or lung: Secondary | ICD-10-CM | POA: Insufficient documentation

## 2021-02-24 DIAGNOSIS — Z5112 Encounter for antineoplastic immunotherapy: Secondary | ICD-10-CM | POA: Insufficient documentation

## 2021-02-24 DIAGNOSIS — C61 Malignant neoplasm of prostate: Secondary | ICD-10-CM | POA: Diagnosis not present

## 2021-02-24 LAB — BASIC METABOLIC PANEL
Anion gap: 10 (ref 5–15)
BUN: 39 mg/dL — ABNORMAL HIGH (ref 8–23)
CO2: 24 mmol/L (ref 22–32)
Calcium: 8.7 mg/dL — ABNORMAL LOW (ref 8.9–10.3)
Chloride: 104 mmol/L (ref 98–111)
Creatinine, Ser: 2.08 mg/dL — ABNORMAL HIGH (ref 0.61–1.24)
GFR, Estimated: 33 mL/min — ABNORMAL LOW (ref >60)
Glucose, Bld: 106 mg/dL — ABNORMAL HIGH (ref 70–99)
Potassium: 4.3 mmol/L (ref 3.5–5.1)
Sodium: 138 mmol/L (ref 135–145)

## 2021-02-24 MED ORDER — HEPARIN SOD (PORK) LOCK FLUSH 100 UNIT/ML IV SOLN
500.0000 [IU] | Freq: Once | INTRAVENOUS | Status: AC
  Administered 2021-02-24: 500 [IU] via INTRAVENOUS

## 2021-02-24 MED ORDER — SODIUM CHLORIDE 0.9 % IV SOLN: INTRAVENOUS | Status: AC

## 2021-02-24 MED ORDER — SODIUM CHLORIDE 0.9% FLUSH
10.0000 mL | Freq: Once | INTRAVENOUS | Status: AC
  Administered 2021-02-24: 10 mL via INTRAVENOUS

## 2021-02-24 NOTE — Patient Instructions (Signed)
[facility]  Discharge Instructions: Thank you for choosing [facility] to provide your oncology and hematology care.  If you have a lab appointment with the [facility], please come in thru the Main Entrance and check in at the main information desk.  Wear comfortable clothing and clothing appropriate for easy access to any Portacath or PICC line.   We strive to give you quality time with your provider. You may need to reschedule your appointment if you arrive late (15 or more minutes).  Arriving late affects you and other patients whose appointments are after yours.  Also, if you miss three or more appointments without notifying the office, you may be dismissed from the clinic at the provider's discretion.      For prescription refill requests, have your pharmacy contact our office and allow 72 hours for refills to be completed.    Today you received 1 liter of NS over 1 hour    BELOW ARE SYMPTOMS THAT SHOULD BE REPORTED IMMEDIATELY: *FEVER GREATER THAN 100.4 F (38 C) OR HIGHER *CHILLS OR SWEATING *NAUSEA AND VOMITING THAT IS NOT CONTROLLED WITH YOUR NAUSEA MEDICATION *UNUSUAL SHORTNESS OF BREATH *UNUSUAL BRUISING OR BLEEDING *URINARY PROBLEMS (pain or burning when urinating, or frequent urination) *BOWEL PROBLEMS (unusual diarrhea, constipation, pain near the anus) TENDERNESS IN MOUTH AND THROAT WITH OR WITHOUT PRESENCE OF ULCERS (sore throat, sores in mouth, or a toothache) UNUSUAL RASH, SWELLING OR PAIN  UNUSUAL VAGINAL DISCHARGE OR ITCHING   Items with * indicate a potential emergency and should be followed up as soon as possible or go to the Emergency Department if any problems should occur.  Please show the CHEMOTHERAPY ALERT CARD or IMMUNOTHERAPY ALERT CARD at check-in to the Emergency Department and triage nurse.  Should you have questions after your visit or need to cancel or reschedule your appointment, please contact [facility]  [phone]  and follow the prompts.  Office hours are 8:00 a.m. to 4:30 p.m. Monday - Friday. Please note that voicemails left after 4:00 p.m. may not be returned until the following business day.  We are closed weekends and major holidays. You have access to a nurse at all times for urgent questions. Please call the main number to the clinic [phone] and follow the prompts.  For any non-urgent questions, you may also contact your provider using MyChart. We now offer e-Visits for anyone 13 and older to request care online for non-urgent symptoms. For details visit [email or web address].   Also download the MyChart app! Go to the app store, search "MyChart", open the app, select [facility], and log in with your MyChart username and password.  Due to Covid, a mask is required upon entering the hospital/clinic. If you do not have a mask, one will be given to you upon arrival. For doctor visits, patients may have 1 support person aged 85 or older with them. For treatment visits, patients cannot have anyone with them due to current Covid guidelines and our immunocompromised population.

## 2021-02-24 NOTE — Progress Notes (Signed)
Patients port flushed without difficulty.  Good blood return noted with no bruising or swelling noted at site.  Patient remains accessed for IVF.  Patient is in stable condition.

## 2021-02-24 NOTE — Progress Notes (Signed)
Pt presents today IV fluids NS over 1 hour per provider's order. Vital signs stable and pt stated he felt a lot better then yesterday. Pt denies any dizziness, shortness of breath, pt stated his energy levels have improved.   1 liter of NS given today per MD orders. Tolerated infusion without adverse affects. Vital signs stable. No complaints at this time. Discharged from clinic ambulatory with cane in stable condition. Alert and oriented x 3. F/U with [facility] as scheduled. Pt advised to call clinic if needed, pt verbalized understanding.

## 2021-02-24 NOTE — Progress Notes (Signed)
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 Patient completed 6 cycles carboplatin, paclitaxel and Keytruda 05/03/18-08/21/18. He is currently receiving Keytruda q3 weeks.   Past medical history includes oropharyngeal cancer s/p radiation therapy, gastritis, dysphagia.  Met with patient during infusion. Patient reports recent poor appetite and diarrhea related to stomach bug that lasted ~2 weeks. Patient reports appetite is improving and eating better. He currently has a sore on his tongue limiting his intake. Patient recalls eating 2 good meals/day. For breakfast he has 2 eggs, sausage, waffle, couple pieces of toast and glass of juice. He typically does not eat again until supper. Last night, he ate salisbury steak, green peas, mashed potatoes and gravy. Sometimes has ice cream or pie for dessert. He plans to eat some lemon pie this evening. Patient is currently not drinking Ensure supplements due to cost.   Nutrition Focused Physical Exam:  Severe fat depletion to orbital, buccal, thoracic regions Severe muscle depletion to temple, clavicle, clavicle and acromion, dorsal hand regions   Medications: norco, prilosec, cialis   Labs: 11/30 - glucose 106, BUN 39, Cr 2.08   Anthropometrics: Weights decreased 6 lbs (4.5%) in 3 weeks; significant. Patient weighed 132 lb 4.8 oz on 11/9.   Height: 6'1" Weight: 126 lb  UBW: 160 lb (per pt 4 years ago)  BMI: 16.62   NUTRITION DIAGNOSIS: Severe malnutrition related to chronic illness (lung cancer) as evidenced by severe fat and muscle depletions, intake meeting <75% estimated needs per dietary recall, significant weight loss (4.5%) <1 month.    INTERVENTION:  Encouraged high calorie high protein foods Educated on soft moist high protein foods to aid with mouth sore - handout provided Recommend pt drink Ensure Plus/equivalent twice daily  One complimentary Ensure  Plus case provided today  Contact information provided    MONITORING, EVALUATION, GOAL: Patient will tolerate increased calories and protein to promote weight gain   Next Visit: via telephone ~4 weeks

## 2021-02-25 ENCOUNTER — Ambulatory Visit (HOSPITAL_COMMUNITY): Payer: Medicare Other

## 2021-02-25 MED ORDER — LIDOCAINE VISCOUS HCL 2 % MT SOLN: 15.0000 mL | OROMUCOSAL | 0 refills | Status: DC | PRN

## 2021-02-25 NOTE — Addendum Note (Signed)
Addended by: Joie Bimler on: 02/25/2021 01:21 PM   Modules accepted: Orders

## 2021-03-01 NOTE — Progress Notes (Signed)
[name], [place] [address]   CLINIC:  Medical Oncology/Hematology  PCP:  Lemmie Evens, MD [place]. 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   07/28/2014 Initial Diagnosis   Oropharyngeal cancer   08/03/2014 Imaging   CT CAP- L supraclavicular lymphadenopathy is not completely visualized. This is better seen on the previous neck CT from 07/27/2014. Otherwise, no evidence for metastatic disease in the chest, abdomen, or pelvis.   08/03/2014 Imaging   Bone scan- Uptake at adjacent anterior LEFT 6, 7, 8 ribs likely representing trauma/fractures. Questionable nonspecific increased tracer localization at the posterior RIGHT 8th and 9th ribs, the adjacent nature which raises a a question of trauma as well   08/06/2014 Pathology Results   Dr. Benjamine Mola- Oropharynx, biopsy, Left - INVASIVE SQUAMOUS CELL CARCINOMA.   08/12/2014 Procedure   Dr. Enrique Sack- 1. Multiple extraction of tooth numbers 6, 17, 22, 23, 24, 25, 26, and 27. 3 Quadrants of alveoloplasty   08/17/2014 Pathology Results   PORT and G-TUBE placed by Dr. Carlis Stable.   08/26/2014 PET scan   Large hypermetabolic mass in the left base of tongue. Activity extends across midline to the right base tongue. 2. Intensely hypermetabolic left cervical metastatic lymph nodes. Lymph nodes extend from the left level II position to the left supraclavi   09/01/2014 - 09/22/2014 Chemotherapy   Concurrent chemoradiation with Cisplatin 100 mg/m2 x 2 cycles with Neulasta  support. Held cycle #3 d/t renal toxicity.    09/03/2014 - 10/23/2014 Radiation Therapy   Treated in [place], IMRT Isidore Moos).  Base of tongue and bilat neck. Total dose: 70 Gy in 35 fractions. (of note, he did miss several treatments requiring BID dosing towards the end of treatment).    01/25/2015 PET scan   Near complete resolution of metabolic activity at the base of tongue. Minimal residual activity is likely post treatment effect. 2. Complete resolution of metabolic activity above LEFT cervical lymph nodes. No evidence of residual metabolically active    2/77/4128 Procedure   Port-a-cath removed [name])    05/03/2018 - 08/23/2018 Chemotherapy   The patient had dexamethasone (DECADRON) 4 MG tablet, 8 mg, Oral, Daily, 1 of 1 cycle, Start date: 05/01/2018, End date: 10/23/2018 palonosetron (ALOXI) injection 0.25 mg, 0.25 mg, Intravenous,  Once, 6 of 6 cycles Administration: 0.25 mg (05/03/2018), 0.25 mg (05/24/2018), 0.25 mg (06/14/2018), 0.25 mg (07/09/2018), 0.25 mg (07/30/2018), 0.25 mg (08/21/2018) pegfilgrastim-cbqv (UDENYCA) injection 6 mg, 6 mg, Subcutaneous, Once, 5 of 5 cycles Administration: 6 mg (05/27/2018), 6 mg (06/17/2018), 6 mg (07/11/2018), 6 mg (08/01/2018), 6 mg (08/23/2018) CARBOplatin (PARAPLATIN) 380 mg in sodium chloride 0.9 % 250 mL chemo infusion, 380 mg (100 % of original dose 381 mg), Intravenous,  Once, 6 of 6 cycles Dose modification:   (original dose 381 mg, Cycle 1),   (original dose 309.5 mg, Cycle 2), 307.5 mg (original dose 309.5 mg, Cycle 5) Administration: 380 mg (05/03/2018), 310 mg (05/24/2018), 310 mg (06/14/2018), 340 mg (07/09/2018), 310 mg (07/30/2018), 350 mg (08/21/2018) PACLitaxel (TAXOL) 330 mg in sodium chloride 0.9 %  500 mL chemo infusion (> $RemoveBef'80mg'ElCBkUinuH$ /m2), 175 mg/m2 = 330 mg (100 % of original dose 175 mg/m2), Intravenous,  Once, 6 of 6 cycles Dose modification: 175 mg/m2 (original dose 175 mg/m2, Cycle 1, Reason: Patient Age) Administration: 330 mg (05/03/2018), 330 mg (05/24/2018),  330 mg (06/14/2018), 330 mg (07/09/2018), 330 mg (07/30/2018), 330 mg (08/21/2018)   for chemotherapy treatment.     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, a 70 y.o. male, returns for routine follow-up and consideration for next cycle of chemotherapy. Shannan was last seen on 02/23/2021.  Due for cycle #46 of Carboplatin, paclitaxel and Keytruda today.   Overall, he tells me he has been feeling pretty well. He reports reduced appetite due to thrush present on his tongue. He is eating soups and bread, and he is drinking 2 Ensure daily. He denies chills, vomiting, and tingling/numbness.   Overall, he feels ready for next cycle of chemo today.   REVIEW OF SYSTEMS:  Review of Systems  Constitutional:  Positive for appetite change (50%). Negative for chills and fatigue (70%).  HENT:   Positive for mouth sores and trouble swallowing (chewing).   Gastrointestinal:  Negative for vomiting.  Musculoskeletal:  Positive for arthralgias (7/10 knee).  Neurological:  Negative for numbness.  All other systems reviewed and are negative.  PAST MEDICAL/SURGICAL HISTORY:  Past Medical History:  Diagnosis Date   GERD (gastroesophageal reflux disease)    Mass of neck    dx. oropharyngeal squamous cell carcinoma- Chemo. radiation planned   Oropharyngeal cancer ([facility]) 07/28/2014   dx. 3 weeks ago.- Dr. [facility] [place], [place].   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[facility]) 08/06/2014   SCCa of Left BOT   Past Surgical History:  Procedure Laterality Date   BIOPSY  01/15/2018   Procedure: BIOPSY;   Surgeon: Danie Binder, MD;  [facility];  Service: Endoscopy;;  gastric   COLONOSCOPY N/A 03/13/2016   Procedure: COLONOSCOPY;  Surgeon: Danie Binder, MD;  [facility];  Service: Endoscopy;  Laterality: N/A;  2:15 PM   ESOPHAGOGASTRODUODENOSCOPY (EGD) WITH PROPOFOL N/A 08/17/2014   Procedure: ESOPHAGOGASTRODUODENOSCOPY (EGD) WITH PROPOFOL (procedure #1);  Surgeon: Aviva Signs Md, MD;  Location: [facility];  Service: General;  Laterality: N/A;   ESOPHAGOGASTRODUODENOSCOPY (EGD) WITH PROPOFOL N/A 01/15/2018   Procedure: ESOPHAGOGASTRODUODENOSCOPY (EGD) WITH PROPOFOL;  Surgeon: Danie Binder, MD;  [facility];  Service: Endoscopy;  Laterality: N/A;  9:30am   MULTIPLE EXTRACTIONS WITH ALVEOLOPLASTY N/A 08/12/2014   Procedure: Extraction of tooth #'s 6,17,22,23,24,25,26,27 with alveoloplasty;  Surgeon: Lenn Cal, DDS;  Location: [facility];  Service: Oral Surgery;  Laterality: N/A;   PANENDOSCOPY N/A 08/06/2014   Procedure: PANENDOSCOPY WITH BIOPSY;  Surgeon: Leta Baptist, MD;  Location: [facility];  Service: ENT;  Laterality: N/A;   PEG PLACEMENT Left 08/17/14   PEG PLACEMENT N/A 08/17/2014   Procedure: PERCUTANEOUS ENDOSCOPIC GASTROSTOMY (PEG) PLACEMENT (procedure #1);  Surgeon: Aviva Signs Md, MD;  Location: [facility];  Service: General;  Laterality: N/A;   PORT-A-CATH REMOVAL Right 07/17/2016  Procedure: MINOR REMOVAL PORT-A-CATH;  Surgeon: Aviva Signs, MD;  Location[facility];  Service: General;  Laterality: Right;   PORTACATH PLACEMENT Right 08/17/14   PORTACATH PLACEMENT Right 08/17/2014   Procedure: INSERTION PORT-A-CATH (procedure #2);  Surgeon: Aviva Signs Md, MD;  Location: [facility];  Service: General;  Laterality: Right;   PORTACATH PLACEMENT Left 04/26/2018   Procedure: INSERTION PORT-A-CATH (attached catheter in left subclavian);  Surgeon: Aviva Signs, MD;  Location: [facility];  Service: General;  Laterality: Left;   SAVORY DILATION N/A  01/15/2018   Procedure: SAVORY DILATION;  Surgeon: Danie Binder, MD;  [facility];  Service: Endoscopy;  Laterality: N/A;   VIDEO BRONCHOSCOPY WITH ENDOBRONCHIAL ULTRASOUND N/A 04/15/2018   Procedure: VIDEO BRONCHOSCOPY WITH ENDOBRONCHIAL ULTRASOUND;  Surgeon: Melrose Nakayama, MD;  Location: [facility] OR;  Service: Thoracic;  Laterality: N/A;    SOCIAL HISTORY:  Social History   Socioeconomic History   Marital status: Legally Separated    Spouse name: Not on file   Number of children: 5   Years of education: Not on file   Highest education level: Not on file  Occupational History   Not on file  Tobacco Use   Smoking status: Former    Packs/day: 0.50    Years: 30.00    Pack years: 15.00    Types: Cigarettes    Quit date: 07/22/2014    Years since quitting: 6.6   Smokeless tobacco: Never  Vaping Use   Vaping Use: Never used  Substance and Sexual Activity   Alcohol use: Not Currently    Alcohol/week: 0.0 standard drinks    Comment: None currently (11/09/17); previously 1-2 beers on the weekend   Drug use: No   Sexual activity: Not on file  Other Topics Concern   Not on file  Social History Narrative   Not on file   Social Determinants of Health   Financial Resource Strain: Not on file  Food Insecurity: Not on file  Transportation Needs: Not on file  Physical Activity: Not on file  Stress: Not on file  Social Connections: Not on file  Intimate Partner Violence: Not on file    FAMILY HISTORY:  Family History  Problem Relation Age of Onset   Colon cancer Neg Hx    Gastric cancer Neg Hx    Esophageal cancer Neg Hx     CURRENT MEDICATIONS:  Current Outpatient Medications  Medication Sig Dispense Refill   CARBOPLATIN IV Inject into the vein every 21 ( twenty-one) days.     feeding supplement, ENSURE ENLIVE, (ENSURE ENLIVE) LIQD Take 237 mLs by mouth 4 (four) times daily.      HYDROcodone-acetaminophen (NORCO/VICODIN) 5-325 MG tablet Take 1 tablet by  mouth every 12 (twelve) hours as needed for moderate pain. 60 tablet 0   levothyroxine (SYNTHROID) 88 MCG tablet Take 1 tablet (88 mcg total) by mouth daily before breakfast. 30 tablet 3   lidocaine (XYLOCAINE) 2 % solution Use as directed 15 mLs in the mouth or throat as needed for mouth pain. 450 mL 0   Melatonin 10 MG TABS Take 1 tablet by mouth at bedtime.     naproxen sodium (ALEVE) 220 MG tablet Take 220 mg by mouth daily as needed.      omeprazole (PRILOSEC) 20 MG capsule TAKE 1 CAPSULE BY MOUTH 30 MINUTES PRIOR TO BREAKFAST 90 capsule 1   PACLITAXEL IV Inject into the vein every 21 ( twenty-one) days.     Pembrolizumab (KEYTRUDA IV)  Inject into the vein every 21 ( twenty-one) days.     silodosin (RAPAFLO) 8 MG CAPS capsule Take 1 capsule (8 mg total) by mouth daily with breakfast. 30 capsule 11   tadalafil (CIALIS) 20 MG tablet Take 1 tablet (20 mg total) by mouth daily as needed. 10 tablet 5   No current facility-administered medications for this visit.   Facility-Administered Medications Ordered in Other Visits  Medication Dose Route Frequency Provider Last Rate Last Admin   sodium chloride flush (NS) 0.9 % injection 10 mL  10 mL Intracatheter PRN Derek Jack, MD   10 mL at 06/24/19 8921    ALLERGIES:  No Known Allergies  PHYSICAL EXAM:  Performance status (ECOG): 1 - Symptomatic but completely ambulatory  There were no vitals filed for this visit. Wt Readings from Last 3 Encounters:  02/23/21 126 lb (57.2 kg)  02/02/21 132 lb 4.8 oz (60 kg)  01/10/21 130 lb 1.6 oz (59 kg)   Physical Exam Vitals reviewed.  Constitutional:      Appearance: Normal appearance.  HENT:     Mouth/Throat:     Comments: Oral thrush present on tongue Cardiovascular:     Rate and Rhythm: Normal rate and regular rhythm.     Pulses: Normal pulses.     Heart sounds: Normal heart sounds.  Pulmonary:     Effort: Pulmonary effort is normal.     Breath sounds: Normal breath sounds.   Neurological:     General: No focal deficit present.     Mental Status: He is alert and oriented to person, place, and time.  Psychiatric:        Mood and Affect: Mood normal.        Behavior: Behavior normal.    LABORATORY DATA:  I have reviewed the labs as listed.  CBC Latest Ref Rng & Units 03/02/2021 02/23/2021 02/02/2021  WBC 4.0 - 10.5 K/uL 3.7(L) 6.4 3.2(L)  Hemoglobin 13.0 - 17.0 g/dL 10.4(L) 10.5(L) 11.1(L)  Hematocrit 39.0 - 52.0 % 31.6(L) 32.3(L) 33.6(L)  Platelets 150 - 400 K/uL 308 201 221   CMP Latest Ref Rng & Units 03/02/2021 02/24/2021 02/23/2021  Glucose 70 - 99 mg/dL 96 106(H) 91  BUN 8 - 23 mg/dL 30(H) 39(H) 43(H)  Creatinine 0.61 - 1.24 mg/dL 1.62(H) 2.08(H) 3.13(H)  Sodium 135 - 145 mmol/L 140 138 136  Potassium 3.5 - 5.1 mmol/L 5.2(H) 4.3 4.3  Chloride 98 - 111 mmol/L 104 104 100  CO2 22 - 32 mmol/L $RemoveB'27 24 24  'HfGbDBFI$ Calcium 8.9 - 10.3 mg/dL 9.0 8.7(L) 8.8(L)  Total Protein 6.5 - 8.1 g/dL 6.9 - 6.9  Total Bilirubin 0.3 - 1.2 mg/dL 0.2(L) - 1.9(H)  Alkaline Phos 38 - 126 U/L 61 - 62  AST 15 - 41 U/L 23 - 23  ALT 0 - 44 U/L 21 - 17    DIAGNOSTIC IMAGING:  I have independently reviewed the scans and discussed with the patient. No results found.   ASSESSMENT:  1.  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 -6 cycles of carboplatin, paclitaxel and pembrolizumab from 05/03/2018 through 08/21/2018. -Maintenance pembrolizumab started on 09/11/2018. -PET scan on 09/15/2019 showed interval decrease in hypermetabolic areas associated with tongue and floor of the mouth.  Hypermetabolic metastatic lymphadenopathy in the chest is stable.  No new sites seen. -PET scan on 03/15/2020 shows persistent, stable hypermetabolism in the tongue/floor of mouth.  Slight interval decrease in hypermetabolism  with mediastinal/hilar adenopathy.  Persistent hypermetabolic focus in the right supraclavicular region.  New  focus of hypermetabolic them identified in the right external iliac chain of pelvis with no discernible adenopathy on the CT. -CT CAP on 11/24/2020 showed left lower lobe lung nodule measuring 1.3 x 1.1 cm, previously 1.0 x 0.9 cm on CT scan from June.  Lesion was negative on PSMA PET scan.  Stable mediastinal lymph nodes. 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 - Cycle 1 of carboplatin, paclitaxel and pembrolizumab on 01/12/2021.   2.  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   PLAN:  1.  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  Denies any GI symptoms. - He has oral thrush.  We will give him Diflucan for 5 days. - Reviewed his labs today which shows adequate white count and platelet count. - We will proceed with cycle 3 today. - RTC 3 weeks with CT CAP with contrast.    2.  Hypothyroidism: - Continue Synthroid 88 mcg daily.  TSH is 6.09.   3.  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   4.  Bilateral knee pains: - Continue hydrocodone as needed for knee pains.   5.  Nutrition: - Continue Ensure 350 cal 2 times daily.  He is also eating better.   6.  CKD: - Baseline creatinine 1.5-1.8.  Today creatinine is 1.62.   7.  Prostate cancer: - Evaluated by Dr. Alyson Ingles and biopsy was recommended.  Patient declined.  8.  Left kidney mass: - Biopsy of the left kidney mass was consistent with metastatic squamous cell carcinoma.   Orders placed this encounter:  No orders of the defined types were placed in this encounter.    Derek Jack, MD [place] [phone]   I, Thana Ates, am acting as a scribe for Dr. Derek Jack.  I, Derek Jack MD, have reviewed the above documentation for  accuracy and completeness, and I agree with the above.

## 2021-03-02 ENCOUNTER — Inpatient Hospital Stay (HOSPITAL_COMMUNITY): Payer: Medicare Other

## 2021-03-02 ENCOUNTER — Inpatient Hospital Stay (HOSPITAL_BASED_OUTPATIENT_CLINIC_OR_DEPARTMENT_OTHER): Payer: Medicare Other | Admitting: Hematology

## 2021-03-02 ENCOUNTER — Other Ambulatory Visit: Payer: Self-pay

## 2021-03-02 VITALS — BP 117/75 | HR 80 | Temp 97.8°F | Resp 18 | Wt 125.6 lb

## 2021-03-02 DIAGNOSIS — N2889 Other specified disorders of kidney and ureter: Secondary | ICD-10-CM

## 2021-03-02 DIAGNOSIS — C109 Malignant neoplasm of oropharynx, unspecified: Secondary | ICD-10-CM

## 2021-03-02 DIAGNOSIS — R972 Elevated prostate specific antigen [PSA]: Secondary | ICD-10-CM

## 2021-03-02 DIAGNOSIS — C3492 Malignant neoplasm of unspecified part of left bronchus or lung: Secondary | ICD-10-CM

## 2021-03-02 DIAGNOSIS — Z5112 Encounter for antineoplastic immunotherapy: Secondary | ICD-10-CM | POA: Diagnosis not present

## 2021-03-02 LAB — COMPREHENSIVE METABOLIC PANEL
ALT: 21 U/L (ref 0–44)
AST: 23 U/L (ref 15–41)
Albumin: 3.3 g/dL — ABNORMAL LOW (ref 3.5–5.0)
Alkaline Phosphatase: 61 U/L (ref 38–126)
Anion gap: 9 (ref 5–15)
BUN: 30 mg/dL — ABNORMAL HIGH (ref 8–23)
CO2: 27 mmol/L (ref 22–32)
Calcium: 9 mg/dL (ref 8.9–10.3)
Chloride: 104 mmol/L (ref 98–111)
Creatinine, Ser: 1.62 mg/dL — ABNORMAL HIGH (ref 0.61–1.24)
GFR, Estimated: 45 mL/min — ABNORMAL LOW (ref >60)
Glucose, Bld: 96 mg/dL (ref 70–99)
Potassium: 5.2 mmol/L — ABNORMAL HIGH (ref 3.5–5.1)
Sodium: 140 mmol/L (ref 135–145)
Total Bilirubin: 0.2 mg/dL — ABNORMAL LOW (ref 0.3–1.2)
Total Protein: 6.9 g/dL (ref 6.5–8.1)

## 2021-03-02 LAB — CBC WITH DIFFERENTIAL/PLATELET
Abs Immature Granulocytes: 0 10*3/uL (ref 0.00–0.07)
Basophils Absolute: 0 10*3/uL (ref 0.0–0.1)
Basophils Relative: 1 %
Eosinophils Absolute: 0.2 10*3/uL (ref 0.0–0.5)
Eosinophils Relative: 5 %
HCT: 31.6 % — ABNORMAL LOW (ref 39.0–52.0)
Hemoglobin: 10.4 g/dL — ABNORMAL LOW (ref 13.0–17.0)
Immature Granulocytes: 0 %
Lymphocytes Relative: 29 %
Lymphs Abs: 1.1 10*3/uL (ref 0.7–4.0)
MCH: 30.9 pg (ref 26.0–34.0)
MCHC: 32.9 g/dL (ref 30.0–36.0)
MCV: 93.8 fL (ref 80.0–100.0)
Monocytes Absolute: 0.6 10*3/uL (ref 0.1–1.0)
Monocytes Relative: 17 %
Neutro Abs: 1.8 10*3/uL (ref 1.7–7.7)
Neutrophils Relative %: 48 %
Platelets: 308 10*3/uL (ref 150–400)
RBC: 3.37 MIL/uL — ABNORMAL LOW (ref 4.22–5.81)
RDW: 16.5 % — ABNORMAL HIGH (ref 11.5–15.5)
WBC: 3.7 10*3/uL — ABNORMAL LOW (ref 4.0–10.5)
nRBC: 0 % (ref 0.0–0.2)

## 2021-03-02 LAB — MAGNESIUM: Magnesium: 2.1 mg/dL (ref 1.7–2.4)

## 2021-03-02 LAB — TSH: TSH: 6.096 u[IU]/mL — ABNORMAL HIGH (ref 0.350–4.500)

## 2021-03-02 MED ORDER — SODIUM CHLORIDE 0.9 % IV SOLN
200.0000 mg | Freq: Once | INTRAVENOUS | Status: AC
  Administered 2021-03-02: 200 mg via INTRAVENOUS
  Filled 2021-03-02: qty 8

## 2021-03-02 MED ORDER — SODIUM CHLORIDE 0.9 % IV SOLN
20.0000 mg | Freq: Once | INTRAVENOUS | Status: AC
  Administered 2021-03-02: 20 mg via INTRAVENOUS
  Filled 2021-03-02: qty 20

## 2021-03-02 MED ORDER — SODIUM CHLORIDE 0.9 % IV SOLN
300.0000 mg | Freq: Once | INTRAVENOUS | Status: AC
  Administered 2021-03-02: 300 mg via INTRAVENOUS
  Filled 2021-03-02: qty 30

## 2021-03-02 MED ORDER — SODIUM CHLORIDE 0.9 % IV SOLN
150.0000 mg/m2 | Freq: Once | INTRAVENOUS | Status: AC
  Administered 2021-03-02: 282 mg via INTRAVENOUS
  Filled 2021-03-02: qty 47

## 2021-03-02 MED ORDER — DIPHENHYDRAMINE HCL 50 MG/ML IJ SOLN
50.0000 mg | Freq: Once | INTRAMUSCULAR | Status: AC
  Administered 2021-03-02: 50 mg via INTRAVENOUS
  Filled 2021-03-02: qty 1

## 2021-03-02 MED ORDER — HEPARIN SOD (PORK) LOCK FLUSH 100 UNIT/ML IV SOLN
500.0000 [IU] | Freq: Once | INTRAVENOUS | Status: AC | PRN
  Administered 2021-03-02: 500 [IU]

## 2021-03-02 MED ORDER — SODIUM CHLORIDE 0.9 % IV SOLN: Freq: Once | INTRAVENOUS | Status: AC

## 2021-03-02 MED ORDER — FAMOTIDINE 20 MG IN NS 100 ML IVPB
20.0000 mg | Freq: Once | INTRAVENOUS | Status: DC
  Filled 2021-03-02: qty 100

## 2021-03-02 MED ORDER — FLUCONAZOLE 100 MG PO TABS: ORAL_TABLET | ORAL | 0 refills | Status: DC

## 2021-03-02 MED ORDER — SODIUM CHLORIDE 0.9% FLUSH
10.0000 mL | INTRAVENOUS | Status: DC | PRN
  Administered 2021-03-02: 10 mL

## 2021-03-02 MED ORDER — FAMOTIDINE IN NACL 20-0.9 MG/50ML-% IV SOLN
20.0000 mg | Freq: Two times a day (BID) | INTRAVENOUS | Status: DC
  Administered 2021-03-02: 20 mg via INTRAVENOUS
  Filled 2021-03-02 (×3): qty 50

## 2021-03-02 MED ORDER — PALONOSETRON HCL INJECTION 0.25 MG/5ML
0.2500 mg | Freq: Once | INTRAVENOUS | Status: AC
  Administered 2021-03-02: 0.25 mg via INTRAVENOUS
  Filled 2021-03-02: qty 5

## 2021-03-02 NOTE — Progress Notes (Signed)
Patient presents today for chemotherapy infusion.  Patient is in satisfactory condition with no new complaints voiced.  Vital signs are stable.  Labs reviewed by Dr. Delton Coombes during his office visit.  Creatinine is out of treatment parameters.  Dr. Delton Coombes dose reduced his treatments due to this.  All other labs are within treatment parameters.  We will proceed with treatment per MD orders.   Patient tolerated treatment well with no complaints voiced.  Patient left ambulatory in stable condition.  Vital signs stable at discharge.  Follow up as scheduled.

## 2021-03-02 NOTE — Progress Notes (Signed)
Patient has been examined, vital signs and labs have been reviewed by Dr. Delton Coombes. ANC, Creatinine, LFTs, hemoglobin, and platelets are within treatment parameters per Dr. Delton Coombes. Patient may proceed with treatment per M.D.

## 2021-03-02 NOTE — Patient Instructions (Signed)
[facility]  Discharge Instructions: Thank you for choosing [facility] to provide your oncology and hematology care.  If you have a lab appointment with the Cancer Center, please come in thru the Main Entrance and check in at the main information desk.  Wear comfortable clothing and clothing appropriate for easy access to any Portacath or PICC line.   We strive to give you quality time with your provider. You may need to reschedule your appointment if you arrive late (15 or more minutes).  Arriving late affects you and other patients whose appointments are after yours.  Also, if you miss three or more appointments without notifying the office, you may be dismissed from the clinic at the provider's discretion.      For prescription refill requests, have your pharmacy contact our office and allow 72 hours for refills to be completed.        To help prevent nausea and vomiting after your treatment, we encourage you to take your nausea medication as directed.  BELOW ARE SYMPTOMS THAT SHOULD BE REPORTED IMMEDIATELY: *FEVER GREATER THAN 100.4 F (38 C) OR HIGHER *CHILLS OR SWEATING *NAUSEA AND VOMITING THAT IS NOT CONTROLLED WITH YOUR NAUSEA MEDICATION *UNUSUAL SHORTNESS OF BREATH *UNUSUAL BRUISING OR BLEEDING *URINARY PROBLEMS (pain or burning when urinating, or frequent urination) *BOWEL PROBLEMS (unusual diarrhea, constipation, pain near the anus) TENDERNESS IN MOUTH AND THROAT WITH OR WITHOUT PRESENCE OF ULCERS (sore throat, sores in mouth, or a toothache) UNUSUAL RASH, SWELLING OR PAIN  UNUSUAL VAGINAL DISCHARGE OR ITCHING   Items with * indicate a potential emergency and should be followed up as soon as possible or go to the Emergency Department if any problems should occur.  Please show the CHEMOTHERAPY ALERT CARD or IMMUNOTHERAPY ALERT CARD at check-in to the Emergency Department and triage nurse.  Should you have questions after your visit or need to cancel  or reschedule your appointment, please contact [facility] [phone]  and follow the prompts.  Office hours are 8:00 a.m. to 4:30 p.m. Monday - Friday. Please note that voicemails left after 4:00 p.m. may not be returned until the following business day.  We are closed weekends and major holidays. You have access to a nurse at all times for urgent questions. Please call the main number to the clinic [phone] and follow the prompts.  For any non-urgent questions, you may also contact your provider using MyChart. We now offer e-Visits for anyone 18 and older to request care online for non-urgent symptoms. For details visit [email or web address].   Also download the MyChart app! Go to the app store, search "MyChart", open the app, select [facility], and log in with your MyChart username and password.  Due to Covid, a mask is required upon entering the hospital/clinic. If you do not have a mask, one will be given to you upon arrival. For doctor visits, patients may have 1 support person aged 18 or older with them. For treatment visits, patients cannot have anyone with them due to current Covid guidelines and our immunocompromised population.  

## 2021-03-02 NOTE — Progress Notes (Signed)
Port flushed well and blood return.  Labs drawn.

## 2021-03-02 NOTE — Patient Instructions (Addendum)
[place] at [facility] Discharge Instructions   You were seen and examined today by Dr. Delton Coombes. We will proceed with your treatment today. You will have a CT scan prior to your next treatment.  We sent a medication called Diflucan to your pharmacy to help with the thrush on your tongue.  Return as scheduled for lab work and treatment.    Thank you for choosing [place] at [facility] to provide your oncology and hematology care.  To afford each patient quality time with our provider, please arrive at least 15 minutes before your scheduled appointment time.   If you have a lab appointment with the [facility] please come in thru the Main Entrance and check in at the main information desk.  You need to re-schedule your appointment should you arrive 10 or more minutes late.  We strive to give you quality time with our providers, and arriving late affects you and other patients whose appointments are after yours.  Also, if you no show three or more times for appointments you may be dismissed from the clinic at the providers discretion.     Again, thank you for choosing [facility].  Our hope is that these requests will decrease the amount of time that you wait before being seen by our physicians.       _____________________________________________________________  Should you have questions after your visit to [facility], please contact our office at [phone] and follow the prompts.  Our office hours are 8:00 a.m. and 4:30 p.m. Monday - Friday.  Please note that voicemails left after 4:00 p.m. may not be returned until the following business day.  We are closed weekends and major holidays.  You do have access to a nurse 24-7, just call the main number to the clinic [phone] and do not press any options, hold on the line and a nurse will answer the phone.    For prescription refill requests, have your  pharmacy contact our office and allow 72 hours.    Due to Covid, you will need to wear a mask upon entering the hospital. If you do not have a mask, a mask will be given to you at the Main Entrance upon arrival. For doctor visits, patients may have 1 support person age 43 or older with them. For treatment visits, patients can not have anyone with them due to social distancing guidelines and our immunocompromised population.

## 2021-03-04 ENCOUNTER — Inpatient Hospital Stay (HOSPITAL_COMMUNITY): Payer: Medicare Other

## 2021-03-04 ENCOUNTER — Other Ambulatory Visit: Payer: Self-pay

## 2021-03-04 VITALS — BP 90/59 | HR 89 | Temp 97.0°F | Resp 18 | Ht 73.0 in | Wt 126.0 lb

## 2021-03-04 DIAGNOSIS — C109 Malignant neoplasm of oropharynx, unspecified: Secondary | ICD-10-CM

## 2021-03-04 DIAGNOSIS — Z5112 Encounter for antineoplastic immunotherapy: Secondary | ICD-10-CM | POA: Diagnosis not present

## 2021-03-04 MED ORDER — PEGFILGRASTIM-BMEZ 6 MG/0.6ML ~~LOC~~ SOSY
6.0000 mg | PREFILLED_SYRINGE | Freq: Once | SUBCUTANEOUS | Status: AC
  Administered 2021-03-04: 6 mg via SUBCUTANEOUS
  Filled 2021-03-04: qty 0.6

## 2021-03-04 NOTE — Patient Instructions (Signed)
[facility]  Discharge Instructions: Thank you for choosing [facility] to provide your oncology and hematology care.  If you have a lab appointment with the Cancer Center, please come in thru the Main Entrance and check in at the main information desk.  Wear comfortable clothing and clothing appropriate for easy access to any Portacath or PICC line.   We strive to give you quality time with your provider. You may need to reschedule your appointment if you arrive late (15 or more minutes).  Arriving late affects you and other patients whose appointments are after yours.  Also, if you miss three or more appointments without notifying the office, you may be dismissed from the clinic at the provider's discretion.      For prescription refill requests, have your pharmacy contact our office and allow 72 hours for refills to be completed.        To help prevent nausea and vomiting after your treatment, we encourage you to take your nausea medication as directed.  BELOW ARE SYMPTOMS THAT SHOULD BE REPORTED IMMEDIATELY: *FEVER GREATER THAN 100.4 F (38 C) OR HIGHER *CHILLS OR SWEATING *NAUSEA AND VOMITING THAT IS NOT CONTROLLED WITH YOUR NAUSEA MEDICATION *UNUSUAL SHORTNESS OF BREATH *UNUSUAL BRUISING OR BLEEDING *URINARY PROBLEMS (pain or burning when urinating, or frequent urination) *BOWEL PROBLEMS (unusual diarrhea, constipation, pain near the anus) TENDERNESS IN MOUTH AND THROAT WITH OR WITHOUT PRESENCE OF ULCERS (sore throat, sores in mouth, or a toothache) UNUSUAL RASH, SWELLING OR PAIN  UNUSUAL VAGINAL DISCHARGE OR ITCHING   Items with * indicate a potential emergency and should be followed up as soon as possible or go to the Emergency Department if any problems should occur.  Please show the CHEMOTHERAPY ALERT CARD or IMMUNOTHERAPY ALERT CARD at check-in to the Emergency Department and triage nurse.  Should you have questions after your visit or need to cancel  or reschedule your appointment, please contact [facility] [phone]  and follow the prompts.  Office hours are 8:00 a.m. to 4:30 p.m. Monday - Friday. Please note that voicemails left after 4:00 p.m. may not be returned until the following business day.  We are closed weekends and major holidays. You have access to a nurse at all times for urgent questions. Please call the main number to the clinic [phone] and follow the prompts.  For any non-urgent questions, you may also contact your provider using MyChart. We now offer e-Visits for anyone 18 and older to request care online for non-urgent symptoms. For details visit [email or web address].   Also download the MyChart app! Go to the app store, search "MyChart", open the app, select , and log in with your MyChart username and password.  Due to Covid, a mask is required upon entering the hospital/clinic. If you do not have a mask, one will be given to you upon arrival. For doctor visits, patients may have 1 support person aged 18 or older with them. For treatment visits, patients cannot have anyone with them due to current Covid guidelines and our immunocompromised population.  

## 2021-03-04 NOTE — Progress Notes (Signed)
Injection given per orders. Patient tolerated it well without problems. Vitals stable and discharged home from clinic ambulatory. Follow up as scheduled.

## 2021-03-07 ENCOUNTER — Other Ambulatory Visit (HOSPITAL_COMMUNITY): Payer: Self-pay | Admitting: *Deleted

## 2021-03-07 MED ORDER — HYDROCODONE-ACETAMINOPHEN 5-325 MG PO TABS: 1.0000 | ORAL_TABLET | Freq: Two times a day (BID) | ORAL | 0 refills | Status: DC | PRN

## 2021-03-13 ENCOUNTER — Other Ambulatory Visit: Payer: Self-pay

## 2021-03-14 ENCOUNTER — Ambulatory Visit (HOSPITAL_COMMUNITY)
Admission: RE | Admit: 2021-03-14 | Discharge: 2021-03-14 | Disposition: A | Discharge status: Home / Self Care | Payer: Medicare Other | Source: Ambulatory Visit | Attending: Hematology | Admitting: Hematology

## 2021-03-14 ENCOUNTER — Other Ambulatory Visit: Payer: Self-pay

## 2021-03-14 DIAGNOSIS — C3492 Malignant neoplasm of unspecified part of left bronchus or lung: Secondary | ICD-10-CM | POA: Diagnosis not present

## 2021-03-14 MED ORDER — IOHEXOL 300 MG/ML  SOLN
75.0000 mL | Freq: Once | INTRAMUSCULAR | Status: AC | PRN
  Administered 2021-03-14: 12:00:00 75 mL via INTRAVENOUS

## 2021-03-16 ENCOUNTER — Ambulatory Visit (HOSPITAL_COMMUNITY): Payer: Medicare Other

## 2021-03-16 ENCOUNTER — Ambulatory Visit (HOSPITAL_COMMUNITY): Payer: Medicare Other | Admitting: Hematology

## 2021-03-16 ENCOUNTER — Other Ambulatory Visit (HOSPITAL_COMMUNITY): Payer: Medicare Other

## 2021-03-18 ENCOUNTER — Ambulatory Visit (HOSPITAL_COMMUNITY): Payer: Medicare Other

## 2021-03-21 ENCOUNTER — Encounter (HOSPITAL_COMMUNITY): Payer: Self-pay | Admitting: Internal Medicine

## 2021-03-22 NOTE — Progress Notes (Signed)
Alexander [name], [name]   CLINIC:  Medical Oncology/Hematology  PCP:  Lemmie Evens, MD [place]. 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   07/28/2014 Initial Diagnosis   Oropharyngeal cancer   08/03/2014 Imaging   CT CAP- L supraclavicular lymphadenopathy is not completely visualized. This is better seen on the previous neck CT from 07/27/2014. Otherwise, no evidence for metastatic disease in the chest, abdomen, or pelvis.   08/03/2014 Imaging   Bone scan- Uptake at adjacent anterior LEFT 6, 7, 8 ribs likely representing trauma/fractures. Questionable nonspecific increased tracer localization at the posterior RIGHT 8th and 9th ribs, the adjacent nature which raises a a question of trauma as well   08/06/2014 Pathology Results   Dr. Benjamine Mola- Oropharynx, biopsy, Left - INVASIVE SQUAMOUS CELL CARCINOMA.   08/12/2014 Procedure   Dr. Enrique Sack- 1. Multiple extraction of tooth numbers 6, 17, 22, 23, 24, 25, 26, and 27. 3 Quadrants of alveoloplasty   08/17/2014 Pathology Results   PORT and G-TUBE placed by Dr. Carlis Stable.   08/26/2014 PET scan   Large hypermetabolic mass in the left base of tongue. Activity extends across midline to the right base tongue. 2. Intensely hypermetabolic left cervical metastatic lymph nodes. Lymph nodes extend from the left level II position to the left supraclavi   09/01/2014 - 09/22/2014 Chemotherapy   Concurrent chemoradiation with Cisplatin 100 mg/m2 x 2 cycles with Neulasta  support. Held cycle #3 d/t renal toxicity.    09/03/2014 - 10/23/2014 Radiation Therapy   Treated in [place], IMRT Isidore Moos).  Base of tongue and bilat neck. Total dose: 70 Gy in 35 fractions. (of note, he did miss several treatments requiring BID dosing towards the end of treatment).    01/25/2015 PET scan   Near complete resolution of metabolic activity at the base of tongue. Minimal residual activity is likely post treatment effect. 2. Complete resolution of metabolic activity above LEFT cervical lymph nodes. No evidence of residual metabolically active    2/33/0076 Procedure   Port-a-cath removed [name])    05/03/2018 - 08/23/2018 Chemotherapy   The patient had dexamethasone (DECADRON) 4 MG tablet, 8 mg, Oral, Daily, 1 of 1 cycle, Start date: 05/01/2018, End date: 10/23/2018 palonosetron (ALOXI) injection 0.25 mg, 0.25 mg, Intravenous,  Once, 6 of 6 cycles Administration: 0.25 mg (05/03/2018), 0.25 mg (05/24/2018), 0.25 mg (06/14/2018), 0.25 mg (07/09/2018), 0.25 mg (07/30/2018), 0.25 mg (08/21/2018) pegfilgrastim-cbqv (UDENYCA) injection 6 mg, 6 mg, Subcutaneous, Once, 5 of 5 cycles Administration: 6 mg (05/27/2018), 6 mg (06/17/2018), 6 mg (07/11/2018), 6 mg (08/01/2018), 6 mg (08/23/2018) CARBOplatin (PARAPLATIN) 380 mg in sodium chloride 0.9 % 250 mL chemo infusion, 380 mg (100 % of original dose 381 mg), Intravenous,  Once, 6 of 6 cycles Dose modification:   (original dose 381 mg, Cycle 1),   (original dose 309.5 mg, Cycle 2), 307.5 mg (original dose 309.5 mg, Cycle 5) Administration: 380 mg (05/03/2018), 310 mg (05/24/2018), 310 mg (06/14/2018), 340 mg (07/09/2018), 310 mg (07/30/2018), 350 mg (08/21/2018) PACLitaxel (TAXOL) 330 mg in sodium chloride 0.9 %  500 mL chemo infusion (> 13m/m2), 175 mg/m2 = 330 mg (100 % of original dose 175 mg/m2), Intravenous,  Once, 6 of 6 cycles Dose modification: 175 mg/m2 (original dose 175 mg/m2, Cycle 1, Reason: Patient Age) Administration: 330 mg (05/03/2018), 330 mg (05/24/2018),  330 mg (06/14/2018), 330 mg (07/09/2018), 330 mg (07/30/2018), 330 mg (08/21/2018)   for chemotherapy treatment.     05/24/2018 -  Chemotherapy   Patient is on Treatment Plan : HEAD/NECK Pembrolizumab/Taxol/Carboplatin Q21D     Squamous cell lung cancer, left ([facility]  06/14/2018 Initial Diagnosis   Squamous cell lung cancer, left ([facility])     CANCER STAGING:  Cancer Staging  Oropharyngeal carcinoma ([facility] Staging form: Pharynx - Oropharynx, AJCC 7th Edition - Clinical: Stage IVA (T4a, N2b, M0) - Unsigned   INTERVAL HISTORY:  Alexander [name] a 70y.o. male, returns for routine follow-up and consideration for next cycle of chemotherapy. CCarrickwas last seen on 03/02/2021.  Due for cycle #47 of Keytruda, Carboplatin, and Taxol today.   Overall, he tells me he has been feeling pretty well. His appetite has improved. He is drinking 2 Ensure daily. He denies tingling/numbness in his hands and feet. He reports pain in his knees bilaterally for which he takes 1 hydrocodone tablets daily.   Overall, he feels ready for next cycle of chemo today.   REVIEW OF SYSTEMS:  Review of Systems  Constitutional:  Negative for appetite change and fatigue.  Respiratory:  Positive for cough.   Musculoskeletal:  Positive for arthralgias (4/10 knees).  All other systems reviewed and are negative.  PAST MEDICAL/SURGICAL HISTORY:  Past Medical History:  Diagnosis Date   GERD (gastroesophageal reflux disease)    Mass of neck    dx. oropharyngeal squamous cell carcinoma- Chemo. radiation planned   [place] cancer ([facility] 07/28/2014   [place] 3 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[facility] 08/06/2014   SCCa of Left BOT   Past Surgical History:  Procedure Laterality Date   BIOPSY  01/15/2018   Procedure: BIOPSY;  Surgeon: FDanie Binder MD;  [facility];  Service: Endoscopy;;  gastric   COLONOSCOPY N/A 03/13/2016   Procedure:  COLONOSCOPY;  Surgeon: SDanie Binder MD;  [facility];  Service: Endoscopy;  Laterality: N/A;  2:15 PM   ESOPHAGOGASTRODUODENOSCOPY (EGD) WITH PROPOFOL N/A 08/17/2014   Procedure: ESOPHAGOGASTRODUODENOSCOPY (EGD) WITH PROPOFOL (procedure #1);  Surgeon: MAviva SignsMd, MD;  Location: [facility];  Service: General;  Laterality: N/A;   ESOPHAGOGASTRODUODENOSCOPY (EGD) WITH PROPOFOL N/A 01/15/2018   Procedure: ESOPHAGOGASTRODUODENOSCOPY (EGD) WITH PROPOFOL;  Surgeon: FDanie Binder MD;  [facility];  Service: Endoscopy;  Laterality: N/A;  9:30am   MULTIPLE EXTRACTIONS WITH ALVEOLOPLASTY N/A 08/12/2014   Procedure: Extraction of tooth #'s 6,17,22,23,24,25,26,27 with alveoloplasty;  Surgeon: RLenn Cal DDS;  Location: [facility];  Service: Oral Surgery;  Laterality: N/A;   PANENDOSCOPY N/A 08/06/2014   Procedure: PANENDOSCOPY WITH BIOPSY;  Surgeon: SLeta Baptist MD;  [facility]  Service: ENT;  Laterality: N/A;   PEG PLACEMENT Left 08/17/14   PEG PLACEMENT N/A 08/17/2014   Procedure: PERCUTANEOUS ENDOSCOPIC GASTROSTOMY (PEG) PLACEMENT (procedure #1);  Surgeon: MAviva SignsMd, MD;  Location: [facility];  Service: General;  Laterality: N/A;   PORT-A-CATH REMOVAL Right 07/17/2016   Procedure: MINOR REMOVAL PORT-A-CATH;  Surgeon: MAviva Signs MD;  Location: [facility];  Service: General;  Laterality: Right;  PORTACATH PLACEMENT Right 08/17/14   PORTACATH PLACEMENT Right 08/17/2014   Procedure: INSERTION PORT-A-CATH (procedure #2);  Surgeon: Aviva Signs Md, MD;  Location: [facility];  Service: General;  Laterality: Right;   PORTACATH PLACEMENT Left 04/26/2018   Procedure: INSERTION PORT-A-CATH (attached catheter in left subclavian);  Surgeon: Aviva Signs, MD;  Location: [facility];  Service: General;  Laterality: Left;   SAVORY DILATION N/A 01/15/2018   Procedure: SAVORY DILATION;  Surgeon: Danie Binder, MD;  [facility];  Service: Endoscopy;  Laterality: N/A;    VIDEO BRONCHOSCOPY WITH ENDOBRONCHIAL ULTRASOUND N/A 04/15/2018   Procedure: VIDEO BRONCHOSCOPY WITH ENDOBRONCHIAL ULTRASOUND;  Surgeon: Melrose Nakayama, MD;  Location[facility] OR;  Service: Thoracic;  Laterality: N/A;    SOCIAL HISTORY:  Social History   Socioeconomic History   Marital status: Legally Separated    Spouse name: Not on file   Number of children: 5   Years of education: Not on file   Highest education level: Not on file  Occupational History   Not on file  Tobacco Use   Smoking status: Former    Packs/day: 0.50    Years: 30.00    Pack years: 15.00    Types: Cigarettes    Quit date: 07/22/2014    Years since quitting: 6.6   Smokeless tobacco: Never  Vaping Use   Vaping Use: Never used  Substance and Sexual Activity   Alcohol use: Not Currently    Alcohol/week: 0.0 standard drinks    Comment: None currently (11/09/17); previously 1-2 beers on the weekend   Drug use: No   Sexual activity: Not on file  Other Topics Concern   Not on file  Social History Narrative   Not on file   Social Determinants of Health   Financial Resource Strain: Not on file  Food Insecurity: Not on file  Transportation Needs: Not on file  Physical Activity: Not on file  Stress: Not on file  Social Connections: Not on file  Intimate Partner Violence: Not on file    FAMILY HISTORY:  Family History  Problem Relation Age of Onset   Colon cancer Neg Hx    Gastric cancer Neg Hx    Esophageal cancer Neg Hx     CURRENT MEDICATIONS:  Current Outpatient Medications  Medication Sig Dispense Refill   CARBOPLATIN IV Inject into the vein every 21 ( twenty-one) days.     feeding supplement, ENSURE ENLIVE, (ENSURE ENLIVE) LIQD Take 237 mLs by mouth 4 (four) times daily.      fluconazole (DIFLUCAN) 100 MG tablet Take 2 tablets (200 mg) by mouth on the first day then 1 tablet (100 mg) daily for the following 4 days 6 tablet 0   levothyroxine (SYNTHROID) 88 MCG tablet Take 1 tablet (88 mcg  total) by mouth daily before breakfast. 30 tablet 3   Melatonin 10 MG TABS Take 1 tablet by mouth at bedtime.     omeprazole (PRILOSEC) 20 MG capsule TAKE 1 CAPSULE BY MOUTH 30 MINUTES PRIOR TO BREAKFAST 90 capsule 1   PACLITAXEL IV Inject into the vein every 21 ( twenty-one) days.     Pembrolizumab (KEYTRUDA IV) Inject into the vein every 21 ( twenty-one) days.     silodosin (RAPAFLO) 8 MG CAPS capsule Take 1 capsule (8 mg total) by mouth daily with breakfast. 30 capsule 11   HYDROcodone-acetaminophen (NORCO/VICODIN) 5-325 MG tablet Take 1 tablet by mouth every 12 (twelve) hours as needed for moderate pain. (Patient not taking:  Reported on 03/23/2021) 60 tablet 0   lidocaine (XYLOCAINE) 2 % solution Use as directed 15 mLs in the mouth or throat as needed for mouth pain. (Patient not taking: Reported on 03/23/2021) 450 mL 0   naproxen sodium (ALEVE) 220 MG tablet Take 220 mg by mouth daily as needed.  (Patient not taking: Reported on 03/23/2021)     tadalafil (CIALIS) 20 MG tablet Take 1 tablet (20 mg total) by mouth daily as needed. (Patient not taking: Reported on 03/23/2021) 10 tablet 5   No current facility-administered medications for this visit.   Facility-Administered Medications Ordered in Other Visits  Medication Dose Route Frequency Provider Last Rate Last Admin   sodium chloride flush (NS) 0.9 % injection 10 mL  10 mL Intracatheter PRN Derek Jack, MD   10 mL at 06/24/19 0925    ALLERGIES:  No Known Allergies  PHYSICAL EXAM:  Performance status (ECOG): 1 - Symptomatic but completely ambulatory  Vitals:   03/23/21 0810  BP: 103/71  Pulse: 69  Resp: 17  Temp: 97.8 F (36.6 C)  SpO2: 100%   Wt Readings from Last 3 Encounters:  03/23/21 131 lb 3.2 oz (59.5 kg)  03/04/21 126 lb (57.2 kg)  03/02/21 125 lb 9.6 oz (57 kg)   Physical Exam Vitals reviewed.  Constitutional:      Appearance: Normal appearance.  Cardiovascular:     Rate and Rhythm: Normal rate and  regular rhythm.     Pulses: Normal pulses.     Heart sounds: Normal heart sounds.  Pulmonary:     Effort: Pulmonary effort is normal.     Breath sounds: Normal breath sounds.  Neurological:     General: No focal deficit present.     Mental Status: He is alert and oriented to person, place, and time.  Psychiatric:        Mood and Affect: Mood normal.        Behavior: Behavior normal.    LABORATORY DATA:  I have reviewed the labs as listed.  CBC Latest Ref Rng & Units 03/23/2021 03/02/2021 02/23/2021  WBC 4.0 - 10.5 K/uL 3.3(L) 3.7(L) 6.4  Hemoglobin 13.0 - 17.0 g/dL 9.9(L) 10.4(L) 10.5(L)  Hematocrit 39.0 - 52.0 % 31.2(L) 31.6(L) 32.3(L)  Platelets 150 - 400 K/uL 165 308 201   CMP Latest Ref Rng & Units 03/23/2021 03/02/2021 02/24/2021  Glucose 70 - 99 mg/dL 89 96 106(H)  BUN 8 - 23 mg/dL 22 30(H) 39(H)  Creatinine 0.61 - 1.24 mg/dL 1.38(H) 1.62(H) 2.08(H)  Sodium 135 - 145 mmol/L 139 140 138  Potassium 3.5 - 5.1 mmol/L 4.2 5.2(H) 4.3  Chloride 98 - 111 mmol/L 107 104 104  CO2 22 - 32 mmol/L 26 27 24   Calcium 8.9 - 10.3 mg/dL 8.7(L) 9.0 8.7(L)  Total Protein 6.5 - 8.1 g/dL 7.0 6.9 -  Total Bilirubin 0.3 - 1.2 mg/dL 0.1(L) 0.2(L) -  Alkaline Phos 38 - 126 U/L 68 61 -  AST 15 - 41 U/L 18 23 -  ALT 0 - 44 U/L 14 21 -    DIAGNOSTIC IMAGING:  I have independently reviewed the scans and discussed with the patient. CT CHEST ABDOMEN PELVIS W CONTRAST  Result Date: 03/14/2021 CLINICAL DATA:  Advanced squamous cell left lung cancer initially diagnosed in 2020. Biopsy-proven progressive metastatic disease to the left kidney with interval chemotherapy. Restaging. Additional history of stage IVa squamous cell carcinoma of the tongue base diagnosed in 2016. Additional history of prostate cancer. EXAM: CT CHEST,  ABDOMEN, AND PELVIS WITH CONTRAST TECHNIQUE: Multidetector CT imaging of the chest, abdomen and pelvis was performed following the standard protocol during bolus administration of  intravenous contrast. CONTRAST:  39m OMNIPAQUE IOHEXOL 300 MG/ML  SOLN COMPARISON:  11/24/2020 CT chest, abdomen and pelvis. FINDINGS: CT CHEST FINDINGS Cardiovascular: Normal heart size. No significant pericardial effusion/thickening. Three-vessel coronary atherosclerosis. Left subclavian Port-A-Cath terminates in the middle third of the SVC. Atherosclerotic nonaneurysmal thoracic aorta. Normal caliber pulmonary arteries. No central pulmonary emboli. Mediastinum/Nodes: No discrete thyroid nodules. Unremarkable esophagus. No axillary adenopathy. Heterogeneous enlarged 1.3 cm high left prevascular mediastinal node (series 2/image 16), previously 1.3 cm, stable. Heterogeneous enlarged short axis diameter 2.1 cm right paratracheal node (series 2/image 26), previously 2.2 cm, not appreciably changed. Heterogeneous enlarged 2.4 cm subcarinal node (series 2/image 36), previously 2.5 cm using similar measurement technique, not appreciably changed. Heterogeneous enlarged 1.8 cm AP window node (series 2/image 30), previously 1.9 cm, not appreciably changed. Heterogeneous enlarged 1.3 cm posterior paraesophageal node (series 2/image 25), previously 1.3 cm, stable. No new pathologically enlarged mediastinal nodes. No right hilar adenopathy. Heterogeneous enlarged 2.0 cm left infrahilar node (series 2/image 38), previously 2.1 cm, not appreciably changed. Lungs/Pleura: No pneumothorax. No pleural effusion. Moderate centrilobular and paraseptal emphysema. No acute consolidative airspace disease or lung masses. Spiculated solid 1.4 x 1.0 cm posteromedial left lower lobe pulmonary nodule (series 4/image 1014), previously 1.5 x 1.2 cm using similar measurement technique, not substantially changed. Numerous (greater than 20) additional tiny scattered solid pulmonary nodules throughout both lungs, largest 0.5 cm in the right middle lobe (series 4/image 142), not appreciably changed. No new significant pulmonary nodules.  Musculoskeletal: No aggressive appearing focal osseous lesions. Mild thoracic spondylosis. CT ABDOMEN PELVIS FINDINGS Hepatobiliary: Several scattered benign liver cysts, largest 2.3 cm in the superior right liver. Numerous subcentimeter hypodense liver lesions scattered throughout the liver are too small to characterize and are not appreciably changed. No appreciable new liver lesions. Normal gallbladder with no radiopaque cholelithiasis. No biliary ductal dilatation. Pancreas: Normal, with no mass or duct dilation. Spleen: Normal size. No mass. Adrenals/Urinary Tract: Normal adrenals. Heterogeneous 4.2 x 3.7 cm inferior left renal mass (series 3/image 22), previously 5.0 x 4.1 cm, mildly decreased. No new renal masses. No hydronephrosis. Chronic diffuse bladder wall thickening. No significant bladder distention. Stomach/Bowel: Normal non-distended stomach. Normal caliber small bowel with no small bowel wall thickening. Normal appendix. Normal large bowel with no diverticulosis, large bowel wall thickening or pericolonic fat stranding. Vascular/Lymphatic: Atherosclerotic nonaneurysmal abdominal aorta. Patent portal, splenic, hepatic and renal veins. Left para-aortic lymphadenopathy measuring up to 1.6 cm short axis diameter (series 2/image 80), previously 1.6 cm using similar measurement technique, stable. No additional pathologically enlarged lymph nodes in the abdomen or pelvis. Reproductive: Moderately enlarged and heterogeneous enhancing prostate gland, unchanged. Other: No pneumoperitoneum, ascites or focal fluid collection. Musculoskeletal: No aggressive appearing focal osseous lesions. Marked lumbar spondylosis. IMPRESSION: 1. Left renal metastasis is mildly decreased. 2. Widespread mediastinal, left infrahilar and left para-aortic metastatic adenopathy is stable. 3. Spiculated solid 1.4 cm posteromedial left lower lobe pulmonary nodule is stable. Numerous additional tiny solid pulmonary nodules throughout  both lungs are stable. 4. No new or progressive metastatic disease. 5. Chronic findings include: Three-vessel coronary atherosclerosis. Chronic diffuse bladder wall thickening probably due to chronic bladder outlet obstruction by the moderately enlarged prostate gland. Aortic Atherosclerosis (ICD10-I70.0) and Emphysema (ICD10-J43.9). Electronically Signed   By: JIlona SorrelM.D.   On: 03/14/2021 21:46     ASSESSMENT:  1.  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 -6 cycles of carboplatin, paclitaxel and pembrolizumab from 05/03/2018 through 08/21/2018. -Maintenance pembrolizumab started on 09/11/2018. -PET scan on 09/15/2019 showed interval decrease in hypermetabolic areas associated with tongue and floor of the mouth.  Hypermetabolic metastatic lymphadenopathy in the chest is stable.  No new sites seen. -PET scan on 03/15/2020 shows persistent, stable hypermetabolism in the tongue/floor of mouth.  Slight interval decrease in hypermetabolism with mediastinal/hilar adenopathy.  Persistent hypermetabolic focus in the right supraclavicular region.  New focus of hypermetabolic them identified in the right external iliac chain of pelvis with no discernible adenopathy on the CT. -CT CAP on 11/24/2020 showed left lower lobe lung nodule measuring 1.3 x 1.1 cm, previously 1.0 x 0.9 cm on CT scan from June.  Lesion was negative on PSMA PET scan.  Stable mediastinal lymph nodes. 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 - Cycle 1 of carboplatin, paclitaxel and pembrolizumab on 01/12/2021.   2.  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   PLAN:  1.  Advanced squamous cell carcinoma of the left lung: - We have reviewed CT CAP from 03/14/2021.  Left renal metastasis has slightly decreased.  Measures  4.2 x 3.7 cm, previously 5 x 4.1 cm.  Mediastinal, left infrahilar and left para-aortic lymphadenopathy is stable.  Spiculated solid 1.4 cm left lower lobe pulmonary nodule is stable.  No new or progressive metastatic disease. - Because of partial response and good tolerance, recommend 3 more cycles of chemotherapy. - Reviewed labs from today which showed normal LFTs.  CBC shows white count 3.3 with normal ANC.  Platelet count was normal. - Proceed with cycle 4 today without any dose modifications. - RTC 3 weeks for follow-up.     2.  Hypothyroidism: - Continue Synthroid 88 mcg daily.  TSH today is normal at 3.36.   3.  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   4.  Bilateral knee pains: - He reports more pain in the knees because of cold weather. - Will increase hydrocodone to 10 mg daily as needed.   5.  Nutrition: - Continue Ensure 350 cal twice daily.  He is also eating good.   6.  CKD: - Baseline creatinine 1.5-1.8.  Today creatinine is 1.38.   7.  Prostate cancer: - Evaluated by Dr. Alyson Ingles and biopsy was recommended.  Patient declined.  8.  Left kidney mass: - Biopsy of the left kidney mass was consistent with metastatic squamous cell carcinoma.   Orders placed this encounter:  No orders of the defined types were placed in this encounter.    Derek Jack, MD [place] [phone]   I, Thana Ates, am acting as a scribe for Dr. Derek Jack.  I, Derek Jack MD, have reviewed the above documentation for accuracy and completeness, and I agree with the above.

## 2021-03-23 ENCOUNTER — Other Ambulatory Visit: Payer: Self-pay

## 2021-03-23 ENCOUNTER — Inpatient Hospital Stay (HOSPITAL_COMMUNITY): Payer: Medicare Other

## 2021-03-23 ENCOUNTER — Inpatient Hospital Stay (HOSPITAL_BASED_OUTPATIENT_CLINIC_OR_DEPARTMENT_OTHER): Payer: Medicare Other | Admitting: Hematology

## 2021-03-23 VITALS — BP 103/71 | HR 69 | Temp 97.8°F | Resp 17 | Ht 73.0 in | Wt 131.2 lb

## 2021-03-23 VITALS — BP 113/64 | HR 70 | Temp 97.5°F | Resp 18

## 2021-03-23 DIAGNOSIS — N2889 Other specified disorders of kidney and ureter: Secondary | ICD-10-CM

## 2021-03-23 DIAGNOSIS — R972 Elevated prostate specific antigen [PSA]: Secondary | ICD-10-CM | POA: Diagnosis not present

## 2021-03-23 DIAGNOSIS — C3492 Malignant neoplasm of unspecified part of left bronchus or lung: Secondary | ICD-10-CM | POA: Diagnosis not present

## 2021-03-23 DIAGNOSIS — C109 Malignant neoplasm of oropharynx, unspecified: Secondary | ICD-10-CM

## 2021-03-23 DIAGNOSIS — Z5112 Encounter for antineoplastic immunotherapy: Secondary | ICD-10-CM | POA: Diagnosis not present

## 2021-03-23 LAB — CBC WITH DIFFERENTIAL/PLATELET
Abs Immature Granulocytes: 0.01 10*3/uL (ref 0.00–0.07)
Basophils Absolute: 0 10*3/uL (ref 0.0–0.1)
Basophils Relative: 0 %
Eosinophils Absolute: 0.1 10*3/uL (ref 0.0–0.5)
Eosinophils Relative: 2 %
HCT: 31.2 % — ABNORMAL LOW (ref 39.0–52.0)
Hemoglobin: 9.9 g/dL — ABNORMAL LOW (ref 13.0–17.0)
Immature Granulocytes: 0 %
Lymphocytes Relative: 27 %
Lymphs Abs: 0.9 10*3/uL (ref 0.7–4.0)
MCH: 30.3 pg (ref 26.0–34.0)
MCHC: 31.7 g/dL (ref 30.0–36.0)
MCV: 95.4 fL (ref 80.0–100.0)
Monocytes Absolute: 0.6 10*3/uL (ref 0.1–1.0)
Monocytes Relative: 19 %
Neutro Abs: 1.7 10*3/uL (ref 1.7–7.7)
Neutrophils Relative %: 52 %
Platelets: 165 10*3/uL (ref 150–400)
RBC: 3.27 MIL/uL — ABNORMAL LOW (ref 4.22–5.81)
RDW: 18.6 % — ABNORMAL HIGH (ref 11.5–15.5)
WBC: 3.3 10*3/uL — ABNORMAL LOW (ref 4.0–10.5)
nRBC: 0 % (ref 0.0–0.2)

## 2021-03-23 LAB — COMPREHENSIVE METABOLIC PANEL
ALT: 14 U/L (ref 0–44)
AST: 18 U/L (ref 15–41)
Albumin: 3.6 g/dL (ref 3.5–5.0)
Alkaline Phosphatase: 68 U/L (ref 38–126)
Anion gap: 6 (ref 5–15)
BUN: 22 mg/dL (ref 8–23)
CO2: 26 mmol/L (ref 22–32)
Calcium: 8.7 mg/dL — ABNORMAL LOW (ref 8.9–10.3)
Chloride: 107 mmol/L (ref 98–111)
Creatinine, Ser: 1.38 mg/dL — ABNORMAL HIGH (ref 0.61–1.24)
GFR, Estimated: 55 mL/min — ABNORMAL LOW (ref >60)
Glucose, Bld: 89 mg/dL (ref 70–99)
Potassium: 4.2 mmol/L (ref 3.5–5.1)
Sodium: 139 mmol/L (ref 135–145)
Total Bilirubin: 0.1 mg/dL — ABNORMAL LOW (ref 0.3–1.2)
Total Protein: 7 g/dL (ref 6.5–8.1)

## 2021-03-23 LAB — TSH: TSH: 3.336 u[IU]/mL (ref 0.350–4.500)

## 2021-03-23 LAB — MAGNESIUM: Magnesium: 2 mg/dL (ref 1.7–2.4)

## 2021-03-23 MED ORDER — SODIUM CHLORIDE 0.9 % IV SOLN: Freq: Once | INTRAVENOUS | Status: AC

## 2021-03-23 MED ORDER — SODIUM CHLORIDE 0.9 % IV SOLN
175.0000 mg/m2 | Freq: Once | INTRAVENOUS | Status: AC
  Administered 2021-03-23: 12:00:00 330 mg via INTRAVENOUS
  Filled 2021-03-23: qty 55

## 2021-03-23 MED ORDER — HEPARIN SOD (PORK) LOCK FLUSH 100 UNIT/ML IV SOLN
500.0000 [IU] | Freq: Once | INTRAVENOUS | Status: AC | PRN
  Administered 2021-03-23: 16:00:00 500 [IU]

## 2021-03-23 MED ORDER — SODIUM CHLORIDE 0.9 % IV SOLN
200.0000 mg | Freq: Once | INTRAVENOUS | Status: AC
  Administered 2021-03-23: 11:00:00 200 mg via INTRAVENOUS
  Filled 2021-03-23: qty 8

## 2021-03-23 MED ORDER — SODIUM CHLORIDE 0.9 % IV SOLN
20.0000 mg | Freq: Once | INTRAVENOUS | Status: AC
  Administered 2021-03-23: 10:00:00 20 mg via INTRAVENOUS
  Filled 2021-03-23: qty 2

## 2021-03-23 MED ORDER — DIPHENHYDRAMINE HCL 50 MG/ML IJ SOLN
50.0000 mg | Freq: Once | INTRAMUSCULAR | Status: AC
  Administered 2021-03-23: 10:00:00 50 mg via INTRAVENOUS
  Filled 2021-03-23: qty 1

## 2021-03-23 MED ORDER — SODIUM CHLORIDE 0.9 % IV SOLN
330.0000 mg | Freq: Once | INTRAVENOUS | Status: AC
  Administered 2021-03-23: 15:00:00 330 mg via INTRAVENOUS
  Filled 2021-03-23: qty 33

## 2021-03-23 MED ORDER — FAMOTIDINE 20 MG IN NS 100 ML IVPB: 20.0000 mg | Freq: Once | INTRAVENOUS | Status: DC

## 2021-03-23 MED ORDER — SODIUM CHLORIDE 0.9% FLUSH
10.0000 mL | INTRAVENOUS | Status: DC | PRN
  Administered 2021-03-23 (×2): 10 mL

## 2021-03-23 MED ORDER — PALONOSETRON HCL INJECTION 0.25 MG/5ML
0.2500 mg | Freq: Once | INTRAVENOUS | Status: AC
  Administered 2021-03-23: 10:00:00 0.25 mg via INTRAVENOUS

## 2021-03-23 MED ORDER — FAMOTIDINE IN NACL 20-0.9 MG/50ML-% IV SOLN
20.0000 mg | Freq: Once | INTRAVENOUS | Status: AC
  Administered 2021-03-23: 10:00:00 20 mg via INTRAVENOUS
  Filled 2021-03-23: qty 50

## 2021-03-23 NOTE — Progress Notes (Signed)
Patient tolerated chemotherapy with no complaints voiced. Side effects with management reviewed understanding verbalized. Port site clean and dry with no bruising or swelling noted at site. Good blood return noted before and after administration of chemotherapy. Band aid applied. Patient left in satisfactory condition with VSS and no s/s of distress noted. 

## 2021-03-23 NOTE — Progress Notes (Signed)
Patient has been examined by Dr. Katragadda, and vital signs and labs have been reviewed. ANC, Creatinine, LFTs, hemoglobin, and platelets are within treatment parameters per M.D. - pt may proceed with treatment.    °

## 2021-03-23 NOTE — Patient Instructions (Signed)
[place] at [facility] Discharge Instructions   You were seen and examined today by Dr. Delton Coombes. He reviewed your lab work, which is normal/stable. We will proceed with treatment today. Return as scheduled in 3 weeks.    Thank you for choosing [place] at [facility] to provide your oncology and hematology care.  To afford each patient quality time with our provider, please arrive at least 15 minutes before your scheduled appointment time.   If you have a lab appointment with the [facility] please come in thru the Main Entrance and check in at the main information desk.  You need to re-schedule your appointment should you arrive 10 or more minutes late.  We strive to give you quality time with our providers, and arriving late affects you and other patients whose appointments are after yours.  Also, if you no show three or more times for appointments you may be dismissed from the clinic at the providers discretion.     Again, thank you for choosing [facility].  Our hope is that these requests will decrease the amount of time that you wait before being seen by our physicians.       _____________________________________________________________  Should you have questions after your visit to [facility], please contact our office at [phone] and follow the prompts.  Our office hours are 8:00 a.m. and 4:30 p.m. Monday - Friday.  Please note that voicemails left after 4:00 p.m. may not be returned until the following business day.  We are closed weekends and major holidays.  You do have access to a nurse 24-7, just call the main number to the clinic [phone] and do not press any options, hold on the line and a nurse will answer the phone.    For prescription refill requests, have your pharmacy contact our office and allow 72 hours.    Due to Covid, you will need to wear a mask upon entering the hospital. If  you do not have a mask, a mask will be given to you at the Main Entrance upon arrival. For doctor visits, patients may have 1 support person age 40 or older with them. For treatment visits, patients can not have anyone with them due to social distancing guidelines and our immunocompromised population.

## 2021-03-23 NOTE — Patient Instructions (Signed)
[facility]  Discharge Instructions: Thank you for choosing [facility] to provide your oncology and hematology care.  If you have a lab appointment with the [facility], please come in thru the Main Entrance and check in at the main information desk.  Wear comfortable clothing and clothing appropriate for easy access to any Portacath or PICC line.   We strive to give you quality time with your provider. You may need to reschedule your appointment if you arrive late (15 or more minutes).  Arriving late affects you and other patients whose appointments are after yours.  Also, if you miss three or more appointments without notifying the office, you may be dismissed from the clinic at the providers discretion.      For prescription refill requests, have your pharmacy contact our office and allow 72 hours for refills to be completed.    Today you received the following chemotherapy and/or immunotherapy agents Keytruda, Taxol, Carboplatin. Return as scheduled.   To help prevent nausea and vomiting after your treatment, we encourage you to take your nausea medication as directed.  BELOW ARE SYMPTOMS THAT SHOULD BE REPORTED IMMEDIATELY: *FEVER GREATER THAN 100.4 F (38 C) OR HIGHER *CHILLS OR SWEATING *NAUSEA AND VOMITING THAT IS NOT CONTROLLED WITH YOUR NAUSEA MEDICATION *UNUSUAL SHORTNESS OF BREATH *UNUSUAL BRUISING OR BLEEDING *URINARY PROBLEMS (pain or burning when urinating, or frequent urination) *BOWEL PROBLEMS (unusual diarrhea, constipation, pain near the anus) TENDERNESS IN MOUTH AND THROAT WITH OR WITHOUT PRESENCE OF ULCERS (sore throat, sores in mouth, or a toothache) UNUSUAL RASH, SWELLING OR PAIN  UNUSUAL VAGINAL DISCHARGE OR ITCHING   Items with * indicate a potential emergency and should be followed up as soon as possible or go to the Emergency Department if any problems should occur.  Please show the CHEMOTHERAPY ALERT CARD or IMMUNOTHERAPY ALERT  CARD at check-in to the Emergency Department and triage nurse.  Should you have questions after your visit or need to cancel or reschedule your appointment, please contact [facility] [phone]  and follow the prompts.  Office hours are 8:00 a.m. to 4:30 p.m. Monday - Friday. Please note that voicemails left after 4:00 p.m. may not be returned until the following business day.  We are closed weekends and major holidays. You have access to a nurse at all times for urgent questions. Please call the main number to the clinic [phone] and follow the prompts.  For any non-urgent questions, you may also contact your provider using MyChart. We now offer e-Visits for anyone 74 and older to request care online for non-urgent symptoms. For details visit [email or web address].   Also download the MyChart app! Go to the app store, search "MyChart", open the app, select [facility], and log in with your MyChart username and password.  Due to Covid, a mask is required upon entering the hospital/clinic. If you do not have a mask, one will be given to you upon arrival. For doctor visits, patients may have 1 support person aged 62 or older with them. For treatment visits, patients cannot have anyone with them due to current Covid guidelines and our immunocompromised population.

## 2021-03-25 ENCOUNTER — Other Ambulatory Visit: Payer: Self-pay

## 2021-03-25 ENCOUNTER — Encounter (HOSPITAL_COMMUNITY): Payer: Self-pay

## 2021-03-25 ENCOUNTER — Inpatient Hospital Stay (HOSPITAL_COMMUNITY): Payer: Medicare Other

## 2021-03-25 VITALS — BP 104/62 | HR 86 | Temp 97.5°F | Resp 18

## 2021-03-25 DIAGNOSIS — Z5112 Encounter for antineoplastic immunotherapy: Secondary | ICD-10-CM | POA: Diagnosis not present

## 2021-03-25 DIAGNOSIS — C109 Malignant neoplasm of oropharynx, unspecified: Secondary | ICD-10-CM

## 2021-03-25 MED ORDER — PEGFILGRASTIM-BMEZ 6 MG/0.6ML ~~LOC~~ SOSY
6.0000 mg | PREFILLED_SYRINGE | Freq: Once | SUBCUTANEOUS | Status: AC
  Administered 2021-03-25: 10:00:00 6 mg via SUBCUTANEOUS
  Filled 2021-03-25: qty 0.6

## 2021-03-25 NOTE — Patient Instructions (Signed)
[facility]  Discharge Instructions: Thank you for choosing [facility] to provide your oncology and hematology care.  If you have a lab appointment with the Cancer Center, please come in thru the Main Entrance and check in at the main information desk.  Wear comfortable clothing and clothing appropriate for easy access to any Portacath or PICC line.   We strive to give you quality time with your provider. You may need to reschedule your appointment if you arrive late (15 or more minutes).  Arriving late affects you and other patients whose appointments are after yours.  Also, if you miss three or more appointments without notifying the office, you may be dismissed from the clinic at the provider's discretion.      For prescription refill requests, have your pharmacy contact our office and allow 72 hours for refills to be completed.        To help prevent nausea and vomiting after your treatment, we encourage you to take your nausea medication as directed.  BELOW ARE SYMPTOMS THAT SHOULD BE REPORTED IMMEDIATELY: *FEVER GREATER THAN 100.4 F (38 C) OR HIGHER *CHILLS OR SWEATING *NAUSEA AND VOMITING THAT IS NOT CONTROLLED WITH YOUR NAUSEA MEDICATION *UNUSUAL SHORTNESS OF BREATH *UNUSUAL BRUISING OR BLEEDING *URINARY PROBLEMS (pain or burning when urinating, or frequent urination) *BOWEL PROBLEMS (unusual diarrhea, constipation, pain near the anus) TENDERNESS IN MOUTH AND THROAT WITH OR WITHOUT PRESENCE OF ULCERS (sore throat, sores in mouth, or a toothache) UNUSUAL RASH, SWELLING OR PAIN  UNUSUAL VAGINAL DISCHARGE OR ITCHING   Items with * indicate a potential emergency and should be followed up as soon as possible or go to the Emergency Department if any problems should occur.  Please show the CHEMOTHERAPY ALERT CARD or IMMUNOTHERAPY ALERT CARD at check-in to the Emergency Department and triage nurse.  Should you have questions after your visit or need to cancel  or reschedule your appointment, please contact [facility] [phone]  and follow the prompts.  Office hours are 8:00 a.m. to 4:30 p.m. Monday - Friday. Please note that voicemails left after 4:00 p.m. may not be returned until the following business day.  We are closed weekends and major holidays. You have access to a nurse at all times for urgent questions. Please call the main number to the clinic [phone] and follow the prompts.  For any non-urgent questions, you may also contact your provider using MyChart. We now offer e-Visits for anyone 18 and older to request care online for non-urgent symptoms. For details visit [email or web address].   Also download the MyChart app! Go to the app store, search "MyChart", open the app, select [facility], and log in with your MyChart username and password.  Due to Covid, a mask is required upon entering the hospital/clinic. If you do not have a mask, one will be given to you upon arrival. For doctor visits, patients may have 1 support person aged 18 or older with them. For treatment visits, patients cannot have anyone with them due to current Covid guidelines and our immunocompromised population.  

## 2021-03-30 ENCOUNTER — Other Ambulatory Visit (HOSPITAL_COMMUNITY): Payer: Self-pay | Admitting: *Deleted

## 2021-03-30 MED ORDER — HYDROCODONE-ACETAMINOPHEN 10-325 MG PO TABS: 1.0000 | ORAL_TABLET | Freq: Two times a day (BID) | ORAL | 0 refills | Status: DC | PRN

## 2021-04-01 ENCOUNTER — Other Ambulatory Visit: Payer: Self-pay | Admitting: Gastroenterology

## 2021-04-04 ENCOUNTER — Encounter: Payer: Self-pay | Admitting: Internal Medicine

## 2021-04-04 NOTE — Telephone Encounter (Signed)
Refilled but needs office visit.

## 2021-04-13 ENCOUNTER — Other Ambulatory Visit: Payer: Self-pay

## 2021-04-13 ENCOUNTER — Encounter (HOSPITAL_COMMUNITY): Payer: Self-pay | Admitting: Hematology

## 2021-04-13 ENCOUNTER — Inpatient Hospital Stay (HOSPITAL_COMMUNITY): Payer: Medicare Other

## 2021-04-13 ENCOUNTER — Inpatient Hospital Stay (HOSPITAL_BASED_OUTPATIENT_CLINIC_OR_DEPARTMENT_OTHER): Payer: Medicare Other | Admitting: Hematology

## 2021-04-13 ENCOUNTER — Inpatient Hospital Stay (HOSPITAL_COMMUNITY): Payer: Medicare Other | Attending: Hematology

## 2021-04-13 VITALS — BP 115/65 | HR 65 | Temp 97.8°F | Resp 16 | Ht 73.0 in | Wt 133.8 lb

## 2021-04-13 DIAGNOSIS — Z85810 Personal history of malignant neoplasm of tongue: Secondary | ICD-10-CM | POA: Insufficient documentation

## 2021-04-13 DIAGNOSIS — M25561 Pain in right knee: Secondary | ICD-10-CM | POA: Insufficient documentation

## 2021-04-13 DIAGNOSIS — C61 Malignant neoplasm of prostate: Secondary | ICD-10-CM | POA: Insufficient documentation

## 2021-04-13 DIAGNOSIS — Z5111 Encounter for antineoplastic chemotherapy: Secondary | ICD-10-CM | POA: Insufficient documentation

## 2021-04-13 DIAGNOSIS — Z87891 Personal history of nicotine dependence: Secondary | ICD-10-CM | POA: Diagnosis not present

## 2021-04-13 DIAGNOSIS — M25562 Pain in left knee: Secondary | ICD-10-CM | POA: Insufficient documentation

## 2021-04-13 DIAGNOSIS — E039 Hypothyroidism, unspecified: Secondary | ICD-10-CM | POA: Insufficient documentation

## 2021-04-13 DIAGNOSIS — Z5189 Encounter for other specified aftercare: Secondary | ICD-10-CM | POA: Insufficient documentation

## 2021-04-13 DIAGNOSIS — C3492 Malignant neoplasm of unspecified part of left bronchus or lung: Secondary | ICD-10-CM | POA: Insufficient documentation

## 2021-04-13 DIAGNOSIS — Z79890 Hormone replacement therapy (postmenopausal): Secondary | ICD-10-CM | POA: Diagnosis not present

## 2021-04-13 DIAGNOSIS — N189 Chronic kidney disease, unspecified: Secondary | ICD-10-CM | POA: Insufficient documentation

## 2021-04-13 DIAGNOSIS — Z9221 Personal history of antineoplastic chemotherapy: Secondary | ICD-10-CM | POA: Diagnosis not present

## 2021-04-13 DIAGNOSIS — Z5112 Encounter for antineoplastic immunotherapy: Secondary | ICD-10-CM | POA: Diagnosis present

## 2021-04-13 DIAGNOSIS — Z923 Personal history of irradiation: Secondary | ICD-10-CM | POA: Insufficient documentation

## 2021-04-13 DIAGNOSIS — C7902 Secondary malignant neoplasm of left kidney and renal pelvis: Secondary | ICD-10-CM | POA: Insufficient documentation

## 2021-04-13 DIAGNOSIS — C109 Malignant neoplasm of oropharynx, unspecified: Secondary | ICD-10-CM | POA: Diagnosis not present

## 2021-04-13 LAB — CBC WITH DIFFERENTIAL/PLATELET
Abs Immature Granulocytes: 0.01 10*3/uL (ref 0.00–0.07)
Basophils Absolute: 0 10*3/uL (ref 0.0–0.1)
Basophils Relative: 1 %
Eosinophils Absolute: 0.1 10*3/uL (ref 0.0–0.5)
Eosinophils Relative: 2 %
HCT: 32 % — ABNORMAL LOW (ref 39.0–52.0)
Hemoglobin: 10.2 g/dL — ABNORMAL LOW (ref 13.0–17.0)
Immature Granulocytes: 0 %
Lymphocytes Relative: 24 %
Lymphs Abs: 1 10*3/uL (ref 0.7–4.0)
MCH: 31.3 pg (ref 26.0–34.0)
MCHC: 31.9 g/dL (ref 30.0–36.0)
MCV: 98.2 fL (ref 80.0–100.0)
Monocytes Absolute: 0.8 10*3/uL (ref 0.1–1.0)
Monocytes Relative: 19 %
Neutro Abs: 2.3 10*3/uL (ref 1.7–7.7)
Neutrophils Relative %: 54 %
Platelets: 196 10*3/uL (ref 150–400)
RBC: 3.26 MIL/uL — ABNORMAL LOW (ref 4.22–5.81)
RDW: 19.5 % — ABNORMAL HIGH (ref 11.5–15.5)
WBC: 4.1 10*3/uL (ref 4.0–10.5)
nRBC: 0 % (ref 0.0–0.2)

## 2021-04-13 LAB — MAGNESIUM: Magnesium: 2 mg/dL (ref 1.7–2.4)

## 2021-04-13 LAB — COMPREHENSIVE METABOLIC PANEL
ALT: 13 U/L (ref 0–44)
AST: 18 U/L (ref 15–41)
Albumin: 3.7 g/dL (ref 3.5–5.0)
Alkaline Phosphatase: 68 U/L (ref 38–126)
Anion gap: 9 (ref 5–15)
BUN: 33 mg/dL — ABNORMAL HIGH (ref 8–23)
CO2: 26 mmol/L (ref 22–32)
Calcium: 8.9 mg/dL (ref 8.9–10.3)
Chloride: 105 mmol/L (ref 98–111)
Creatinine, Ser: 1.54 mg/dL — ABNORMAL HIGH (ref 0.61–1.24)
GFR, Estimated: 48 mL/min — ABNORMAL LOW (ref >60)
Glucose, Bld: 99 mg/dL (ref 70–99)
Potassium: 4.2 mmol/L (ref 3.5–5.1)
Sodium: 140 mmol/L (ref 135–145)
Total Bilirubin: 0.5 mg/dL (ref 0.3–1.2)
Total Protein: 7.2 g/dL (ref 6.5–8.1)

## 2021-04-13 LAB — TSH: TSH: 2.467 u[IU]/mL (ref 0.350–4.500)

## 2021-04-13 MED ORDER — SODIUM CHLORIDE 0.9 % IV SOLN
200.0000 mg | Freq: Once | INTRAVENOUS | Status: AC
  Administered 2021-04-13: 200 mg via INTRAVENOUS
  Filled 2021-04-13: qty 8

## 2021-04-13 MED ORDER — SODIUM CHLORIDE 0.9 % IV SOLN: Freq: Once | INTRAVENOUS | Status: AC

## 2021-04-13 MED ORDER — SODIUM CHLORIDE 0.9 % IV SOLN
175.0000 mg/m2 | Freq: Once | INTRAVENOUS | Status: AC
  Administered 2021-04-13: 330 mg via INTRAVENOUS
  Filled 2021-04-13: qty 55

## 2021-04-13 MED ORDER — SODIUM CHLORIDE 0.9 % IV SOLN
330.0000 mg | Freq: Once | INTRAVENOUS | Status: AC
  Administered 2021-04-13: 330 mg via INTRAVENOUS
  Filled 2021-04-13: qty 33

## 2021-04-13 MED ORDER — SODIUM CHLORIDE 0.9 % IV SOLN
20.0000 mg | Freq: Once | INTRAVENOUS | Status: AC
  Administered 2021-04-13: 20 mg via INTRAVENOUS
  Filled 2021-04-13: qty 20

## 2021-04-13 MED ORDER — HEPARIN SOD (PORK) LOCK FLUSH 100 UNIT/ML IV SOLN
500.0000 [IU] | Freq: Once | INTRAVENOUS | Status: AC | PRN
  Administered 2021-04-13: 500 [IU]

## 2021-04-13 MED ORDER — DIPHENHYDRAMINE HCL 50 MG/ML IJ SOLN
50.0000 mg | Freq: Once | INTRAMUSCULAR | Status: AC
  Administered 2021-04-13: 50 mg via INTRAVENOUS
  Filled 2021-04-13: qty 1

## 2021-04-13 MED ORDER — PALONOSETRON HCL INJECTION 0.25 MG/5ML
0.2500 mg | Freq: Once | INTRAVENOUS | Status: AC
  Administered 2021-04-13: 0.25 mg via INTRAVENOUS
  Filled 2021-04-13: qty 5

## 2021-04-13 MED ORDER — SODIUM CHLORIDE 0.9% FLUSH
10.0000 mL | INTRAVENOUS | Status: DC | PRN
  Administered 2021-04-13: 10 mL

## 2021-04-13 MED ORDER — FAMOTIDINE 20 MG IN NS 100 ML IVPB: 20.0000 mg | Freq: Once | INTRAVENOUS | Status: DC

## 2021-04-13 MED ORDER — FAMOTIDINE IN NACL 20-0.9 MG/50ML-% IV SOLN
20.0000 mg | Freq: Once | INTRAVENOUS | Status: AC
  Administered 2021-04-13: 20 mg via INTRAVENOUS
  Filled 2021-04-13: qty 50

## 2021-04-13 NOTE — Patient Instructions (Signed)
[place] at [facility] Discharge Instructions   You were seen and examined today by Dr. Delton Coombes.  He reviewed the results of your lab work which are normal/stable.  We will proceed with your treatment today.  Return as scheduled for lab work, office visit, and treatment.    Thank you for choosing [facility] at [facility] to provide your oncology and hematology care.  To afford each patient quality time with our provider, please arrive at least 15 minutes before your scheduled appointment time.   If you have a lab appointment with the [facility] please come in thru the Main Entrance and check in at the main information desk.  You need to re-schedule your appointment should you arrive 10 or more minutes late.  We strive to give you quality time with our providers, and arriving late affects you and other patients whose appointments are after yours.  Also, if you no show three or more times for appointments you may be dismissed from the clinic at the providers discretion.     Again, thank you for choosing [facility].  Our hope is that these requests will decrease the amount of time that you wait before being seen by our physicians.       _____________________________________________________________  Should you have questions after your visit to [facility], please contact our office at [phone] and follow the prompts.  Our office hours are 8:00 a.m. and 4:30 p.m. Monday - Friday.  Please note that voicemails left after 4:00 p.m. may not be returned until the following business day.  We are closed weekends and major holidays.  You do have access to a nurse 24-7, just call the main number to the clinic [phone] and do not press any options, hold on the line and a nurse will answer the phone.    For prescription refill requests, have your pharmacy contact our office and allow 72 hours.    Due to Covid, you  will need to wear a mask upon entering the hospital. If you do not have a mask, a mask will be given to you at the Main Entrance upon arrival. For doctor visits, patients may have 1 support person age 62 or older with them. For treatment visits, patients can not have anyone with them due to social distancing guidelines and our immunocompromised population.

## 2021-04-13 NOTE — Progress Notes (Signed)
Alexander [name], [place] [address]   CLINIC:  Medical Oncology/Hematology  PCP:  Alexander Evens, MD [place]. 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   07/28/2014 Initial Diagnosis   Oropharyngeal cancer   08/03/2014 Imaging   CT CAP- L supraclavicular lymphadenopathy is not completely visualized. This is better seen on the previous neck CT from 07/27/2014. Otherwise, no evidence for metastatic disease in the chest, abdomen, or pelvis.   08/03/2014 Imaging   Bone scan- Uptake at adjacent anterior LEFT 6, 7, 8 ribs likely representing trauma/fractures. Questionable nonspecific increased tracer localization at the posterior RIGHT 8th and 9th ribs, the adjacent nature which raises a a question of trauma as well   08/06/2014 Pathology Results   Dr. Benjamine [name]- Oropharynx, biopsy, Left - INVASIVE SQUAMOUS CELL CARCINOMA.   08/12/2014 Procedure   Dr. Enrique [name]- 1. Multiple extraction of tooth numbers 6, 17, 22, 23, 24, 25, 26, and 27. 3 Quadrants of alveoloplasty   08/17/2014 Pathology Results   PORT and G-TUBE placed by Dr. Carlis [name].   08/26/2014 PET scan   Large hypermetabolic mass in the left base of tongue. Activity extends across midline to the right base tongue. 2. Intensely hypermetabolic left cervical metastatic lymph nodes. Lymph nodes extend from the left level II position to the left supraclavi   09/01/2014 - 09/22/2014 Chemotherapy   Concurrent chemoradiation with Cisplatin 100 mg/m2 x 2 cycles with Neulasta  support. Held cycle #3 d/t renal toxicity.    09/03/2014 - 10/23/2014 Radiation Therapy   Treated in [facility] [place], IMRT Alexander [name]).  Base of tongue and bilat neck. Total dose: 70 Gy in 35 fractions. (of note, he did miss several treatments requiring BID dosing towards the end of treatment).    01/25/2015 PET scan   Near complete resolution of metabolic activity at the base of tongue. Minimal residual activity is likely post treatment effect. 2. Complete resolution of metabolic activity above LEFT cervical lymph nodes. No evidence of residual metabolically active    9/35/7017 Procedure   Port-a-cath removed Alexander [name])    05/03/2018 - 08/23/2018 Chemotherapy   The patient had dexamethasone (DECADRON) 4 MG tablet, 8 mg, Oral, Daily, 1 of 1 cycle, Start date: 05/01/2018, End date: 10/23/2018 palonosetron (ALOXI) injection 0.25 mg, 0.25 mg, Intravenous,  Once, 6 of 6 cycles Administration: 0.25 mg (05/03/2018), 0.25 mg (05/24/2018), 0.25 mg (06/14/2018), 0.25 mg (07/09/2018), 0.25 mg (07/30/2018), 0.25 mg (08/21/2018) pegfilgrastim-cbqv (UDENYCA) injection 6 mg, 6 mg, Subcutaneous, Once, 5 of 5 cycles Administration: 6 mg (05/27/2018), 6 mg (06/17/2018), 6 mg (07/11/2018), 6 mg (08/01/2018), 6 mg (08/23/2018) CARBOplatin (PARAPLATIN) 380 mg in sodium chloride 0.9 % 250 mL chemo infusion, 380 mg (100 % of original dose 381 mg), Intravenous,  Once, 6 of 6 cycles Dose modification:   (original dose 381 mg, Cycle 1),   (original dose 309.5 mg, Cycle 2), 307.5 mg (original dose 309.5 mg, Cycle 5) Administration: 380 mg (05/03/2018), 310 mg (05/24/2018), 310 mg (06/14/2018), 340 mg (07/09/2018), 310 mg (07/30/2018), 350 mg (08/21/2018) PACLitaxel (TAXOL) 330 mg in sodium chloride 0.9 %  500 mL chemo infusion (> 35m/m2), 175 mg/m2 = 330 mg (100 % of original dose 175 mg/m2), Intravenous,  Once, 6 of 6 cycles Dose modification: 175 mg/m2 (original dose 175 mg/m2, Cycle 1, Reason: Patient Age) Administration: 330 mg (05/03/2018), 330 mg (05/24/2018),  330 mg (06/14/2018), 330 mg (07/09/2018), 330 mg (07/30/2018), 330 mg (08/21/2018)   for chemotherapy treatment.     05/24/2018 -  Chemotherapy   Patient is on Treatment Plan : HEAD/NECK Pembrolizumab/Taxol/Carboplatin Q21D     Squamous cell lung cancer, left ([facility]  06/14/2018 Initial Diagnosis   Squamous cell lung cancer, left ([facility])     CANCER STAGING:  Cancer Staging  Oropharyngeal carcinoma ([facility] Staging form: Pharynx - Oropharynx, AJCC 7th Edition - Clinical: Stage IVA (T4a, N2b, M0) - Unsigned   INTERVAL HISTORY:  [name] a 71y.o. male, returns for routine follow-up and consideration for next cycle of chemotherapy. Alexander [name] last seen on 03/23/2021.  Due for cycle #48 of Keytruda today.   Overall, he tells me he has been feeling pretty well. Her denies SOB, cramping legs and hands, tingling/numbness, and n/v/d. 10 mg of hydrocodone prn has improved his bilateral knee pain. He reports full-body joint pains for 2 days after treatment. He denies ankle swellings.   Overall, he feels ready for next cycle of chemo today.   REVIEW OF SYSTEMS:  Review of Systems  Constitutional:  Negative for appetite change and fatigue.  Respiratory:  Negative for shortness of breath.   Cardiovascular:  Negative for leg swelling.  Gastrointestinal:  Negative for diarrhea, nausea and vomiting.  Musculoskeletal:  Positive for arthralgias (5/10 bilateral knees).  Neurological:  Negative for numbness.  All other systems reviewed and are negative.  PAST MEDICAL/SURGICAL HISTORY:  Past Medical History:  Diagnosis Date   GERD (gastroesophageal reflux disease)    Mass of neck    dx. oropharyngeal squamous cell carcinoma- Chemo. radiation planned   Oropharyngeal cancer ([facility] 07/28/2014   dx. 3 weeks ago.- [facility]. [facility] [place] [name]   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[facility] 08/06/2014   SCCa of Left BOT   Past Surgical History:  Procedure Laterality Date   BIOPSY   01/15/2018   Procedure: BIOPSY;  Surgeon: FDanie Binder MD;  [facility];  Service: Endoscopy;;  gastric   COLONOSCOPY N/A 03/13/2016   Procedure: COLONOSCOPY;  Surgeon: SDanie Binder MD;  [facility];  Service: Endoscopy;  Laterality: N/A;  2:15 PM   ESOPHAGOGASTRODUODENOSCOPY (EGD) WITH PROPOFOL N/A 08/17/2014   Procedure: ESOPHAGOGASTRODUODENOSCOPY (EGD) WITH PROPOFOL (procedure #1);  Surgeon: MAviva SignsMd, MD;  Location: [facility];  Service: General;  Laterality: N/A;   ESOPHAGOGASTRODUODENOSCOPY (EGD) WITH PROPOFOL N/A 01/15/2018   Procedure: ESOPHAGOGASTRODUODENOSCOPY (EGD) WITH PROPOFOL;  Surgeon: FDanie Binder MD;  [facility];  Service: Endoscopy;  Laterality: N/A;  9:30am   MULTIPLE EXTRACTIONS WITH ALVEOLOPLASTY N/A 08/12/2014   Procedure: Extraction of tooth #'s 6,17,22,23,24,25,26,27 with alveoloplasty;  Surgeon: RLenn Cal DDS;  Location: [facility];  Service: Oral Surgery;  Laterality: N/A;   PANENDOSCOPY N/A 08/06/2014   Procedure: PANENDOSCOPY WITH BIOPSY;  Surgeon: SLeta Baptist MD;  [facility]  Service: ENT;  Laterality: N/A;   PEG PLACEMENT Left 08/17/14   PEG PLACEMENT N/A 08/17/2014   Procedure: PERCUTANEOUS ENDOSCOPIC GASTROSTOMY (PEG) PLACEMENT (procedure #1);  Surgeon: MAviva SignsMd, MD;  Location: [facility];  Service: General;  Laterality: N/A;   PORT-A-CATH  REMOVAL Right 07/17/2016   Procedure: MINOR REMOVAL PORT-A-CATH;  Surgeon: Aviva Signs, MD;  Location: [facility];  Service: General;  Laterality: Right;   PORTACATH PLACEMENT Right 08/17/14   PORTACATH PLACEMENT Right 08/17/2014   Procedure: INSERTION PORT-A-CATH (procedure #2);  Surgeon: Aviva Signs Md, MD;  Location: [facility];  Service: General;  Laterality: Right;   PORTACATH PLACEMENT Left 04/26/2018   Procedure: INSERTION PORT-A-CATH (attached catheter in left subclavian);  Surgeon: Aviva Signs, MD;  Location: [facility];  Service: General;  Laterality:  Left;   SAVORY DILATION N/A 01/15/2018   Procedure: SAVORY DILATION;  Surgeon: Alexander Binder, MD;  [facility];  Service: Endoscopy;  Laterality: N/A;   VIDEO BRONCHOSCOPY WITH ENDOBRONCHIAL ULTRASOUND N/A 04/15/2018   Procedure: VIDEO BRONCHOSCOPY WITH ENDOBRONCHIAL ULTRASOUND;  Surgeon: Melrose Nakayama, MD;  [facility]: [facility];  Service: Thoracic;  Laterality: N/A;    SOCIAL HISTORY:  Social History   Socioeconomic History   Marital status: Legally Separated    Spouse name: Not on file   Number of children: 5   Years of education: Not on file   Highest education level: Not on file  Occupational History   Not on file  Tobacco Use   Smoking status: Former    Packs/day: 0.50    Years: 30.00    Pack years: 15.00    Types: Cigarettes    Quit date: 07/22/2014    Years since quitting: 6.7   Smokeless tobacco: Never  Vaping Use   Vaping Use: Never used  Substance and Sexual Activity   Alcohol use: Not Currently    Alcohol/week: 0.0 standard drinks    Comment: None currently (11/09/17); previously 1-2 beers on the weekend   Drug use: No   Sexual activity: Not on file  Other Topics Concern   Not on file  Social History Narrative   Not on file   Social Determinants of Health   Financial Resource Strain: Not on file  Food Insecurity: Not on file  Transportation Needs: Not on file  Physical Activity: Not on file  Stress: Not on file  Social Connections: Not on file  Intimate Partner Violence: Not on file    FAMILY HISTORY:  Family History  Problem Relation Age of Onset   Colon cancer Neg Hx    Gastric cancer Neg Hx    Esophageal cancer Neg Hx     CURRENT MEDICATIONS:  Current Outpatient Medications  Medication Sig Dispense Refill   CARBOPLATIN IV Inject into the vein every 21 ( twenty-one) days.     feeding supplement, ENSURE ENLIVE, (ENSURE ENLIVE) LIQD Take 237 mLs by mouth 4 (four) times daily.      fluconazole (DIFLUCAN) 100 MG tablet Take 2  tablets (200 mg) by mouth on the first day then 1 tablet (100 mg) daily for the following 4 days 6 tablet 0   HYDROcodone-acetaminophen (NORCO) 10-325 MG tablet Take 1 tablet by mouth every 12 (twelve) hours as needed. 60 tablet 0   levothyroxine (SYNTHROID) 88 MCG tablet Take 1 tablet (88 mcg total) by mouth daily before breakfast. 30 tablet 3   lidocaine (XYLOCAINE) 2 % solution Use as directed 15 mLs in the mouth or throat as needed for mouth pain. 450 mL 0   Melatonin 10 MG TABS Take 1 tablet by mouth at bedtime.     naproxen sodium (ALEVE) 220 MG tablet Take 220 mg by mouth daily as needed.     omeprazole (PRILOSEC) 20 MG capsule  TAKE 1 CAPSULE BY MOUTH 30 MINUTES PRIOR TO BREAKFAST 90 capsule 0   PACLITAXEL IV Inject into the vein every 21 ( twenty-one) days.     Pembrolizumab (KEYTRUDA IV) Inject into the vein every 21 ( twenty-one) days.     silodosin (RAPAFLO) 8 MG CAPS capsule Take 1 capsule (8 mg total) by mouth daily with breakfast. 30 capsule 11   tadalafil (CIALIS) 20 MG tablet Take 1 tablet (20 mg total) by mouth daily as needed. 10 tablet 5   No current facility-administered medications for this visit.   Facility-Administered Medications Ordered in Other Visits  Medication Dose Route Frequency Provider Last Rate Last Admin   sodium chloride flush (NS) 0.9 % injection 10 mL  10 mL Intracatheter PRN Derek Jack, MD   10 mL at 06/24/19 6269    ALLERGIES:  No Known Allergies  PHYSICAL EXAM:  Performance status (ECOG): 1 - Symptomatic but completely ambulatory  There were no vitals filed for this visit. Wt Readings from Last 3 Encounters:  04/13/21 133 lb 12.8 oz (60.7 kg)  03/23/21 131 lb 3.2 oz (59.5 kg)  03/04/21 126 lb (57.2 kg)   Physical Exam Vitals reviewed.  Constitutional:      Appearance: Normal appearance.  Cardiovascular:     Rate and Rhythm: Normal rate and regular rhythm.     Pulses: Normal pulses.     Heart sounds: Normal heart sounds.   Pulmonary:     Effort: Pulmonary effort is normal.     Breath sounds: Normal breath sounds.  Neurological:     General: No focal deficit present.     Mental Status: He is alert and oriented to person, place, and time.  Psychiatric:        Mood and Affect: Mood normal.        Behavior: Behavior normal.    LABORATORY DATA:  I have reviewed the labs as listed.  CBC Latest Ref Rng & Units 04/13/2021 03/23/2021 03/02/2021  WBC 4.0 - 10.5 K/uL 4.1 3.3(L) 3.7(L)  Hemoglobin 13.0 - 17.0 g/dL 10.2(L) 9.9(L) 10.4(L)  Hematocrit 39.0 - 52.0 % 32.0(L) 31.2(L) 31.6(L)  Platelets 150 - 400 K/uL 196 165 308   CMP Latest Ref Rng & Units 04/13/2021 03/23/2021 03/02/2021  Glucose 70 - 99 mg/dL 99 89 96  BUN 8 - 23 mg/dL 33(H) 22 30(H)  Creatinine 0.61 - 1.24 mg/dL 1.54(H) 1.38(H) 1.62(H)  Sodium 135 - 145 mmol/L 140 139 140  Potassium 3.5 - 5.1 mmol/L 4.2 4.2 5.2(H)  Chloride 98 - 111 mmol/L 105 107 104  CO2 22 - 32 mmol/L 26 26 27   Calcium 8.9 - 10.3 mg/dL 8.9 8.7(L) 9.0  Total Protein 6.5 - 8.1 g/dL 7.2 7.0 6.9  Total Bilirubin 0.3 - 1.2 mg/dL 0.5 0.1(L) 0.2(L)  Alkaline Phos 38 - 126 U/L 68 68 61  AST 15 - 41 U/L 18 18 23   ALT 0 - 44 U/L 13 14 21     DIAGNOSTIC IMAGING:  I have independently reviewed the scans and discussed with the patient. CT CHEST ABDOMEN PELVIS W CONTRAST  Result Date: 03/14/2021 CLINICAL DATA:  Advanced squamous cell left lung cancer initially diagnosed in 2020. Biopsy-proven progressive metastatic disease to the left kidney with interval chemotherapy. Restaging. Additional history of stage IVa squamous cell carcinoma of the tongue base diagnosed in 2016. Additional history of prostate cancer. EXAM: CT CHEST, ABDOMEN, AND PELVIS WITH CONTRAST TECHNIQUE: Multidetector CT imaging of the chest, abdomen and pelvis was performed following the  standard protocol during bolus administration of intravenous contrast. CONTRAST:  66m OMNIPAQUE IOHEXOL 300 MG/ML  SOLN COMPARISON:   11/24/2020 CT chest, abdomen and pelvis. FINDINGS: CT CHEST FINDINGS Cardiovascular: Normal heart size. No significant pericardial effusion/thickening. Three-vessel coronary atherosclerosis. Left subclavian Port-A-Cath terminates in the middle third of the SVC. Atherosclerotic nonaneurysmal thoracic aorta. Normal caliber pulmonary arteries. No central pulmonary emboli. Mediastinum/Nodes: No discrete thyroid nodules. Unremarkable esophagus. No axillary adenopathy. Heterogeneous enlarged 1.3 cm high left prevascular mediastinal node (series 2/image 16), previously 1.3 cm, [name]. Heterogeneous enlarged short axis diameter 2.1 cm right paratracheal node (series 2/image 26), previously 2.2 cm, not appreciably changed. Heterogeneous enlarged 2.4 cm subcarinal node (series 2/image 36), previously 2.5 cm using similar measurement technique, not appreciably changed. Heterogeneous enlarged 1.8 cm AP window node (series 2/image 30), previously 1.9 cm, not appreciably changed. Heterogeneous enlarged 1.3 cm posterior paraesophageal node (series 2/image 25), previously 1.3 cm, [name]. No new pathologically enlarged mediastinal nodes. No right hilar adenopathy. Heterogeneous enlarged 2.0 cm left infrahilar node (series 2/image 38), previously 2.1 cm, not appreciably changed. Lungs/Pleura: No pneumothorax. No pleural effusion. Moderate centrilobular and paraseptal emphysema. No acute consolidative airspace disease or lung masses. Spiculated solid 1.4 x 1.0 cm posteromedial left lower lobe pulmonary nodule (series 4/image 1014), previously 1.5 x 1.2 cm using similar measurement technique, not substantially changed. Numerous (greater than 20) additional tiny scattered solid pulmonary nodules throughout both lungs, largest 0.5 cm in the right middle lobe (series 4/image 142), not appreciably changed. No new significant pulmonary nodules. Musculoskeletal: No aggressive appearing focal osseous lesions. Mild thoracic spondylosis. CT  ABDOMEN PELVIS FINDINGS Hepatobiliary: Several scattered benign liver cysts, largest 2.3 cm in the superior right liver. Numerous subcentimeter hypodense liver lesions scattered throughout the liver are too small to characterize and are not appreciably changed. No appreciable new liver lesions. Normal gallbladder with no radiopaque cholelithiasis. No biliary ductal dilatation. Pancreas: Normal, with no mass or duct dilation. Spleen: Normal size. No mass. Adrenals/Urinary Tract: Normal adrenals. Heterogeneous 4.2 x 3.7 cm inferior left renal mass (series 3/image 22), previously 5.0 x 4.1 cm, mildly decreased. No new renal masses. No hydronephrosis. Chronic diffuse bladder wall thickening. No significant bladder distention. Stomach/Bowel: Normal non-distended stomach. Normal caliber small bowel with no small bowel wall thickening. Normal appendix. Normal large bowel with no diverticulosis, large bowel wall thickening or pericolonic fat stranding. Vascular/Lymphatic: Atherosclerotic nonaneurysmal abdominal aorta. Patent portal, splenic, hepatic and renal veins. Left para-aortic lymphadenopathy measuring up to 1.6 cm short axis diameter (series 2/image 80), previously 1.6 cm using similar measurement technique, [name]. No additional pathologically enlarged lymph nodes in the abdomen or pelvis. Reproductive: Moderately enlarged and heterogeneous enhancing prostate gland, unchanged. Other: No pneumoperitoneum, ascites or focal fluid collection. Musculoskeletal: No aggressive appearing focal osseous lesions. Marked lumbar spondylosis. IMPRESSION: 1. Left renal metastasis is mildly decreased. 2. Widespread mediastinal, left infrahilar and left para-aortic metastatic adenopathy is [name]. 3. Spiculated solid 1.4 cm posteromedial left lower lobe pulmonary nodule is [name]. Numerous additional tiny solid pulmonary nodules throughout both lungs are [name]. 4. No new or progressive metastatic disease. 5. Chronic findings  include: Three-vessel coronary atherosclerosis. Chronic diffuse bladder wall thickening probably due to chronic bladder outlet obstruction by the moderately enlarged prostate gland. Aortic Atherosclerosis (ICD10-I70.0) and Emphysema (ICD10-J43.9). Electronically Signed   By: JIlona SorrelM.D.   On: 03/14/2021 21:46     ASSESSMENT:  1.  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 -6 cycles of carboplatin, paclitaxel and pembrolizumab from 05/03/2018 through 08/21/2018. -Maintenance pembrolizumab started on 09/11/2018. -PET scan on 09/15/2019 showed interval decrease in hypermetabolic areas associated with tongue and floor of the mouth.  Hypermetabolic metastatic lymphadenopathy in the chest is [name].  No new sites seen. -PET scan on 03/15/2020 shows persistent, [name] hypermetabolism in the tongue/floor of mouth.  Slight interval decrease in hypermetabolism with mediastinal/hilar adenopathy.  Persistent hypermetabolic focus in the right supraclavicular region.  New focus of hypermetabolic them identified in the right external iliac chain of pelvis with no discernible adenopathy on the CT. -CT CAP on 11/24/2020 showed left lower lobe lung nodule measuring 1.3 x 1.1 cm, previously 1.0 x 0.9 cm on CT scan from June.  Lesion was negative on PSMA PET scan.  [name] mediastinal lymph nodes. 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 - Cycle 1 of carboplatin, paclitaxel and pembrolizumab on 01/12/2021.   2.  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  3.  Prostate cancer: - Evaluated by Dr. Alyson Ingles and biopsy was recommended.  Patient declined.  4.  Left kidney mass: - Biopsy of the left kidney mass was consistent with metastatic squamous cell carcinoma.   PLAN:  1.  Advanced squamous cell  carcinoma of the left lung: - CT CAP from 03/14/2021 showed left renal metastasis has slightly decreased measuring 4.2 x 3.7 cm (5 x 4.1 cm previously).  Mediastinal, left infrahilar and left para-aortic lymphadenopathy [name].  Spiculated solid 1.4 cm left lower lobe pulmonary nodule is [name]. - He has tolerated cycle 4 chemotherapy very well. - He had experienced diffuse joint pains for 2 days after G-CSF injection. - Reviewed his labs today which showed normal LFTs.  Renal function is [name].  CBC shows normal white count and platelet count. - Proceed with cycle 5 today.  RTC 3 weeks for follow-up.  We will plan to repeat scan after cycle 6 and plan for maintenance Keytruda after that.     2.  Hypothyroidism: - Continue Synthroid 88 mcg daily.  TSH today is 2.4.   3.  Bilateral knee pains: - Continue hydrocodone 10 mg daily as needed.  Pain is better controlled.   4.  Nutrition: - Continue Ensure 350 cal twice daily.  Weight improved by 2 pounds.   5.  CKD: - Baseline creatinine 1.5-1.8.  Today 1.58.    Orders placed this encounter:  No orders of the defined types were placed in this encounter.    Derek Jack, MD [place] [phone]   I, Thana Ates, am acting as a scribe for Dr. Derek Jack.  I, Derek Jack MD, have reviewed the above documentation for accuracy and completeness, and I agree with the above.

## 2021-04-13 NOTE — Patient Instructions (Signed)
[facility]  Discharge Instructions: Thank you for choosing [facility] to provide your oncology and hematology care.  If you have a lab appointment with the Cancer Center, please come in thru the Main Entrance and check in at the main information desk.  Wear comfortable clothing and clothing appropriate for easy access to any Portacath or PICC line.   We strive to give you quality time with your provider. You may need to reschedule your appointment if you arrive late (15 or more minutes).  Arriving late affects you and other patients whose appointments are after yours.  Also, if you miss three or more appointments without notifying the office, you may be dismissed from the clinic at the provider's discretion.      For prescription refill requests, have your pharmacy contact our office and allow 72 hours for refills to be completed.        To help prevent nausea and vomiting after your treatment, we encourage you to take your nausea medication as directed.  BELOW ARE SYMPTOMS THAT SHOULD BE REPORTED IMMEDIATELY: *FEVER GREATER THAN 100.4 F (38 C) OR HIGHER *CHILLS OR SWEATING *NAUSEA AND VOMITING THAT IS NOT CONTROLLED WITH YOUR NAUSEA MEDICATION *UNUSUAL SHORTNESS OF BREATH *UNUSUAL BRUISING OR BLEEDING *URINARY PROBLEMS (pain or burning when urinating, or frequent urination) *BOWEL PROBLEMS (unusual diarrhea, constipation, pain near the anus) TENDERNESS IN MOUTH AND THROAT WITH OR WITHOUT PRESENCE OF ULCERS (sore throat, sores in mouth, or a toothache) UNUSUAL RASH, SWELLING OR PAIN  UNUSUAL VAGINAL DISCHARGE OR ITCHING   Items with * indicate a potential emergency and should be followed up as soon as possible or go to the Emergency Department if any problems should occur.  Please show the CHEMOTHERAPY ALERT CARD or IMMUNOTHERAPY ALERT CARD at check-in to the Emergency Department and triage nurse.  Should you have questions after your visit or need to cancel  or reschedule your appointment, please contact [facility] [phone]  and follow the prompts.  Office hours are 8:00 a.m. to 4:30 p.m. Monday - Friday. Please note that voicemails left after 4:00 p.m. may not be returned until the following business day.  We are closed weekends and major holidays. You have access to a nurse at all times for urgent questions. Please call the main number to the clinic [phone] and follow the prompts.  For any non-urgent questions, you may also contact your provider using MyChart. We now offer e-Visits for anyone 18 and older to request care online for non-urgent symptoms. For details visit [email or web address].   Also download the MyChart app! Go to the app store, search "MyChart", open the app, select [facility], and log in with your MyChart username and password.  Due to Covid, a mask is required upon entering the hospital/clinic. If you do not have a mask, one will be given to you upon arrival. For doctor visits, patients may have 1 support person aged 18 or older with them. For treatment visits, patients cannot have anyone with them due to current Covid guidelines and our immunocompromised population.  

## 2021-04-13 NOTE — Progress Notes (Signed)
Labs reviewed, creatinine is 1.54. ok to treat per MD.   Treatment given per orders. Patient tolerated it well without problems. Vitals stable and discharged home from clinic ambulatory. Follow up as scheduled.

## 2021-04-13 NOTE — Progress Notes (Signed)
Patient has been examined by Dr. Katragadda, and vital signs and labs have been reviewed. ANC, Creatinine, LFTs, hemoglobin, and platelets are within treatment parameters per M.D. - pt may proceed with treatment.    °

## 2021-04-15 ENCOUNTER — Other Ambulatory Visit: Payer: Self-pay

## 2021-04-15 ENCOUNTER — Inpatient Hospital Stay (HOSPITAL_COMMUNITY): Payer: Medicare Other

## 2021-04-15 VITALS — BP 91/63 | HR 77 | Temp 98.7°F | Resp 18

## 2021-04-15 DIAGNOSIS — C109 Malignant neoplasm of oropharynx, unspecified: Secondary | ICD-10-CM

## 2021-04-15 DIAGNOSIS — Z5112 Encounter for antineoplastic immunotherapy: Secondary | ICD-10-CM | POA: Diagnosis not present

## 2021-04-15 MED ORDER — PEGFILGRASTIM-BMEZ 6 MG/0.6ML ~~LOC~~ SOSY
6.0000 mg | PREFILLED_SYRINGE | Freq: Once | SUBCUTANEOUS | Status: AC
  Administered 2021-04-15: 6 mg via SUBCUTANEOUS

## 2021-04-15 NOTE — Progress Notes (Signed)
Patient presents today for Ziextenzo injection.  Patient is in satisfactory condition with no complaints voiced.  Vital signs are stable.  We will proceed with injection per MD orders.   Patient tolerated injection with no complaints voiced.  Site clean and dry with no bruising or swelling noted.  No complaints of pain.  Discharged with vital signs stable and no signs or symptoms of distress noted.

## 2021-04-15 NOTE — Patient Instructions (Signed)
[facility]  Discharge Instructions: Thank you for choosing [facility] to provide your oncology and hematology care.  If you have a lab appointment with the Cancer Center, please come in thru the Main Entrance and check in at the main information desk.  Wear comfortable clothing and clothing appropriate for easy access to any Portacath or PICC line.   We strive to give you quality time with your provider. You may need to reschedule your appointment if you arrive late (15 or more minutes).  Arriving late affects you and other patients whose appointments are after yours.  Also, if you miss three or more appointments without notifying the office, you may be dismissed from the clinic at the provider's discretion.      For prescription refill requests, have your pharmacy contact our office and allow 72 hours for refills to be completed.        To help prevent nausea and vomiting after your treatment, we encourage you to take your nausea medication as directed.  BELOW ARE SYMPTOMS THAT SHOULD BE REPORTED IMMEDIATELY: *FEVER GREATER THAN 100.4 F (38 C) OR HIGHER *CHILLS OR SWEATING *NAUSEA AND VOMITING THAT IS NOT CONTROLLED WITH YOUR NAUSEA MEDICATION *UNUSUAL SHORTNESS OF BREATH *UNUSUAL BRUISING OR BLEEDING *URINARY PROBLEMS (pain or burning when urinating, or frequent urination) *BOWEL PROBLEMS (unusual diarrhea, constipation, pain near the anus) TENDERNESS IN MOUTH AND THROAT WITH OR WITHOUT PRESENCE OF ULCERS (sore throat, sores in mouth, or a toothache) UNUSUAL RASH, SWELLING OR PAIN  UNUSUAL VAGINAL DISCHARGE OR ITCHING   Items with * indicate a potential emergency and should be followed up as soon as possible or go to the Emergency Department if any problems should occur.  Please show the CHEMOTHERAPY ALERT CARD or IMMUNOTHERAPY ALERT CARD at check-in to the Emergency Department and triage nurse.  Should you have questions after your visit or need to cancel  or reschedule your appointment, please contact [facility] [phone]  and follow the prompts.  Office hours are 8:00 a.m. to 4:30 p.m. Monday - Friday. Please note that voicemails left after 4:00 p.m. may not be returned until the following business day.  We are closed weekends and major holidays. You have access to a nurse at all times for urgent questions. Please call the main number to the clinic [phone] and follow the prompts.  For any non-urgent questions, you may also contact your provider using MyChart. We now offer e-Visits for anyone 18 and older to request care online for non-urgent symptoms. For details visit [email or web address].   Also download the MyChart app! Go to the app store, search "MyChart", open the app, select [facility], and log in with your MyChart username and password.  Due to Covid, a mask is required upon entering the hospital/clinic. If you do not have a mask, one will be given to you upon arrival. For doctor visits, patients may have 1 support person aged 18 or older with them. For treatment visits, patients cannot have anyone with them due to current Covid guidelines and our immunocompromised population.  

## 2021-04-21 ENCOUNTER — Other Ambulatory Visit (HOSPITAL_COMMUNITY): Payer: Self-pay | Admitting: Hematology

## 2021-04-21 DIAGNOSIS — E039 Hypothyroidism, unspecified: Secondary | ICD-10-CM

## 2021-05-02 ENCOUNTER — Other Ambulatory Visit (HOSPITAL_COMMUNITY): Payer: Self-pay

## 2021-05-02 MED ORDER — HYDROCODONE-ACETAMINOPHEN 10-325 MG PO TABS: 1.0000 | ORAL_TABLET | Freq: Two times a day (BID) | ORAL | 0 refills | Status: DC | PRN

## 2021-05-03 ENCOUNTER — Encounter (HOSPITAL_COMMUNITY): Payer: Self-pay | Admitting: *Deleted

## 2021-05-04 ENCOUNTER — Other Ambulatory Visit: Payer: Self-pay

## 2021-05-04 ENCOUNTER — Inpatient Hospital Stay (HOSPITAL_COMMUNITY): Payer: Medicare Other | Attending: Hematology

## 2021-05-04 ENCOUNTER — Inpatient Hospital Stay (HOSPITAL_BASED_OUTPATIENT_CLINIC_OR_DEPARTMENT_OTHER): Payer: Medicare Other | Admitting: Hematology

## 2021-05-04 ENCOUNTER — Inpatient Hospital Stay (HOSPITAL_COMMUNITY): Payer: Medicare Other

## 2021-05-04 VITALS — BP 135/71 | HR 69 | Temp 98.4°F | Resp 18

## 2021-05-04 DIAGNOSIS — Z5112 Encounter for antineoplastic immunotherapy: Secondary | ICD-10-CM | POA: Insufficient documentation

## 2021-05-04 DIAGNOSIS — Z9221 Personal history of antineoplastic chemotherapy: Secondary | ICD-10-CM | POA: Insufficient documentation

## 2021-05-04 DIAGNOSIS — C7902 Secondary malignant neoplasm of left kidney and renal pelvis: Secondary | ICD-10-CM | POA: Diagnosis not present

## 2021-05-04 DIAGNOSIS — C109 Malignant neoplasm of oropharynx, unspecified: Secondary | ICD-10-CM

## 2021-05-04 DIAGNOSIS — Z5111 Encounter for antineoplastic chemotherapy: Secondary | ICD-10-CM | POA: Diagnosis present

## 2021-05-04 DIAGNOSIS — Z87891 Personal history of nicotine dependence: Secondary | ICD-10-CM | POA: Insufficient documentation

## 2021-05-04 DIAGNOSIS — Z923 Personal history of irradiation: Secondary | ICD-10-CM | POA: Diagnosis not present

## 2021-05-04 DIAGNOSIS — Z85810 Personal history of malignant neoplasm of tongue: Secondary | ICD-10-CM | POA: Insufficient documentation

## 2021-05-04 DIAGNOSIS — Z5189 Encounter for other specified aftercare: Secondary | ICD-10-CM | POA: Diagnosis not present

## 2021-05-04 DIAGNOSIS — E039 Hypothyroidism, unspecified: Secondary | ICD-10-CM | POA: Diagnosis not present

## 2021-05-04 DIAGNOSIS — Z79890 Hormone replacement therapy (postmenopausal): Secondary | ICD-10-CM | POA: Insufficient documentation

## 2021-05-04 DIAGNOSIS — N189 Chronic kidney disease, unspecified: Secondary | ICD-10-CM | POA: Insufficient documentation

## 2021-05-04 DIAGNOSIS — C61 Malignant neoplasm of prostate: Secondary | ICD-10-CM | POA: Insufficient documentation

## 2021-05-04 DIAGNOSIS — C3492 Malignant neoplasm of unspecified part of left bronchus or lung: Secondary | ICD-10-CM | POA: Diagnosis present

## 2021-05-04 LAB — CBC WITH DIFFERENTIAL/PLATELET
Abs Immature Granulocytes: 0 10*3/uL (ref 0.00–0.07)
Basophils Absolute: 0 10*3/uL (ref 0.0–0.1)
Basophils Relative: 1 %
Eosinophils Absolute: 0 10*3/uL (ref 0.0–0.5)
Eosinophils Relative: 1 %
HCT: 29.9 % — ABNORMAL LOW (ref 39.0–52.0)
Hemoglobin: 9.7 g/dL — ABNORMAL LOW (ref 13.0–17.0)
Immature Granulocytes: 0 %
Lymphocytes Relative: 25 %
Lymphs Abs: 0.9 10*3/uL (ref 0.7–4.0)
MCH: 31.7 pg (ref 26.0–34.0)
MCHC: 32.4 g/dL (ref 30.0–36.0)
MCV: 97.7 fL (ref 80.0–100.0)
Monocytes Absolute: 0.8 10*3/uL (ref 0.1–1.0)
Monocytes Relative: 22 %
Neutro Abs: 1.8 10*3/uL (ref 1.7–7.7)
Neutrophils Relative %: 51 %
Platelets: 155 10*3/uL (ref 150–400)
RBC: 3.06 MIL/uL — ABNORMAL LOW (ref 4.22–5.81)
RDW: 18.6 % — ABNORMAL HIGH (ref 11.5–15.5)
WBC: 3.5 10*3/uL — ABNORMAL LOW (ref 4.0–10.5)
nRBC: 0 % (ref 0.0–0.2)

## 2021-05-04 LAB — COMPREHENSIVE METABOLIC PANEL
ALT: 16 U/L (ref 0–44)
AST: 17 U/L (ref 15–41)
Albumin: 3.9 g/dL (ref 3.5–5.0)
Alkaline Phosphatase: 61 U/L (ref 38–126)
Anion gap: 7 (ref 5–15)
BUN: 35 mg/dL — ABNORMAL HIGH (ref 8–23)
CO2: 25 mmol/L (ref 22–32)
Calcium: 9.1 mg/dL (ref 8.9–10.3)
Chloride: 106 mmol/L (ref 98–111)
Creatinine, Ser: 1.87 mg/dL — ABNORMAL HIGH (ref 0.61–1.24)
GFR, Estimated: 38 mL/min — ABNORMAL LOW (ref >60)
Glucose, Bld: 86 mg/dL (ref 70–99)
Potassium: 4.7 mmol/L (ref 3.5–5.1)
Sodium: 138 mmol/L (ref 135–145)
Total Bilirubin: 0.7 mg/dL (ref 0.3–1.2)
Total Protein: 7 g/dL (ref 6.5–8.1)

## 2021-05-04 LAB — MAGNESIUM: Magnesium: 2.4 mg/dL (ref 1.7–2.4)

## 2021-05-04 LAB — TSH: TSH: 0.05 u[IU]/mL — ABNORMAL LOW (ref 0.350–4.500)

## 2021-05-04 MED ORDER — SODIUM CHLORIDE 0.9 % IV SOLN
20.0000 mg | Freq: Once | INTRAVENOUS | Status: AC
  Administered 2021-05-04: 20 mg via INTRAVENOUS
  Filled 2021-05-04: qty 2

## 2021-05-04 MED ORDER — PALONOSETRON HCL INJECTION 0.25 MG/5ML
0.2500 mg | Freq: Once | INTRAVENOUS | Status: AC
  Administered 2021-05-04: 0.25 mg via INTRAVENOUS
  Filled 2021-05-04: qty 5

## 2021-05-04 MED ORDER — SODIUM CHLORIDE 0.9 % IV SOLN: Freq: Once | INTRAVENOUS | Status: AC

## 2021-05-04 MED ORDER — DIPHENHYDRAMINE HCL 50 MG/ML IJ SOLN
50.0000 mg | Freq: Once | INTRAMUSCULAR | Status: AC
  Administered 2021-05-04: 50 mg via INTRAVENOUS
  Filled 2021-05-04: qty 1

## 2021-05-04 MED ORDER — SODIUM CHLORIDE 0.9 % IV SOLN
200.0000 mg | Freq: Once | INTRAVENOUS | Status: AC
  Administered 2021-05-04: 200 mg via INTRAVENOUS
  Filled 2021-05-04: qty 8

## 2021-05-04 MED ORDER — SODIUM CHLORIDE 0.9% FLUSH
10.0000 mL | INTRAVENOUS | Status: DC | PRN
  Administered 2021-05-04: 10 mL

## 2021-05-04 MED ORDER — SODIUM CHLORIDE 0.9 % IV SOLN
175.0000 mg/m2 | Freq: Once | INTRAVENOUS | Status: AC
  Administered 2021-05-04: 330 mg via INTRAVENOUS
  Filled 2021-05-04: qty 55

## 2021-05-04 MED ORDER — SODIUM CHLORIDE 0.9 % IV SOLN
302.0000 mg | Freq: Once | INTRAVENOUS | Status: AC
  Administered 2021-05-04: 300 mg via INTRAVENOUS
  Filled 2021-05-04: qty 30

## 2021-05-04 MED ORDER — FAMOTIDINE IN NACL 20-0.9 MG/50ML-% IV SOLN
20.0000 mg | Freq: Once | INTRAVENOUS | Status: AC
  Administered 2021-05-04: 20 mg via INTRAVENOUS
  Filled 2021-05-04: qty 50

## 2021-05-04 MED ORDER — FAMOTIDINE 20 MG IN NS 100 ML IVPB
20.0000 mg | Freq: Once | INTRAVENOUS | Status: DC
  Filled 2021-05-04: qty 100

## 2021-05-04 MED ORDER — HEPARIN SOD (PORK) LOCK FLUSH 100 UNIT/ML IV SOLN
500.0000 [IU] | Freq: Once | INTRAVENOUS | Status: AC | PRN
  Administered 2021-05-04: 500 [IU]

## 2021-05-04 NOTE — Progress Notes (Signed)
Patient has been examined by Dr. Katragadda, and vital signs and labs have been reviewed. ANC, Creatinine, LFTs, hemoglobin, and platelets are within treatment parameters per M.D. - pt may proceed with treatment.    °

## 2021-05-04 NOTE — Patient Instructions (Signed)
[place] at [facility] Discharge Instructions   You were seen and examined today by Dr. Delton Coombes.  He reviewed the results of your lab work which are normal/stable.  We will proceed with treatment today.  We will repeat a CT scan prior to your next visit.  Return as scheduled in 3 weeks.    Thank you for choosing [place] at [facility] to provide your oncology and hematology care.  To afford each patient quality time with our provider, please arrive at least 15 minutes before your scheduled appointment time.   If you have a lab appointment with the [facility] please come in thru the Main Entrance and check in at the main information desk.  You need to re-schedule your appointment should you arrive 10 or more minutes late.  We strive to give you quality time with our providers, and arriving late affects you and other patients whose appointments are after yours.  Also, if you no show three or more times for appointments you may be dismissed from the clinic at the providers discretion.     Again, thank you for choosing [facility].  Our hope is that these requests will decrease the amount of time that you wait before being seen by our physicians.       _____________________________________________________________  Should you have questions after your visit to [facility], please contact our office at [phone] and follow the prompts.  Our office hours are 8:00 a.m. and 4:30 p.m. Monday - Friday.  Please note that voicemails left after 4:00 p.m. may not be returned until the following business day.  We are closed weekends and major holidays.  You do have access to a nurse 24-7, just call the main number to the clinic [phone] and do not press any options, hold on the line and a nurse will answer the phone.    For prescription refill requests, have your pharmacy contact our office and allow 72 hours.     Due to Covid, you will need to wear a mask upon entering the hospital. If you do not have a mask, a mask will be given to you at the Main Entrance upon arrival. For doctor visits, patients may have 1 support person age 35 or older with them. For treatment visits, patients can not have anyone with them due to social distancing guidelines and our immunocompromised population.

## 2021-05-04 NOTE — Progress Notes (Signed)
Patient presents today for Keytruda/Taxol/Carboplatin infusion per providers order.  Vital signs and labs within parameters for treatment.    Message received from Anastasio Champion RN/Dr [facility] patient okay for treatment.  Keytruda/Taxol?Carboplatin infusion given today per MD orders.  Tolerated infusion without adverse affects.  Vital signs stable.  No complaints at this time.  Discharge from clinic ambulatory in stable condition.  Alert and oriented X 3.  Follow up with [facility] as scheduled.

## 2021-05-04 NOTE — Patient Instructions (Signed)
[facility]  Discharge Instructions: Thank you for choosing [facility] to provide your oncology and hematology care.  If you have a lab appointment with the [facility], please come in thru the Main Entrance and check in at the main information desk.  Wear comfortable clothing and clothing appropriate for easy access to any Portacath or PICC line.   We strive to give you quality time with your provider. You may need to reschedule your appointment if you arrive late (15 or more minutes).  Arriving late affects you and other patients whose appointments are after yours.  Also, if you miss three or more appointments without notifying the office, you may be dismissed from the clinic at the providers discretion.      For prescription refill requests, have your pharmacy contact our office and allow 72 hours for refills to be completed.    Today you received the following chemotherapy and/or immunotherapy agents Keytruda/Taxol/Carboplatin      To help prevent nausea and vomiting after your treatment, we encourage you to take your nausea medication as directed.  BELOW ARE SYMPTOMS THAT SHOULD BE REPORTED IMMEDIATELY: *FEVER GREATER THAN 100.4 F (38 C) OR HIGHER *CHILLS OR SWEATING *NAUSEA AND VOMITING THAT IS NOT CONTROLLED WITH YOUR NAUSEA MEDICATION *UNUSUAL SHORTNESS OF BREATH *UNUSUAL BRUISING OR BLEEDING *URINARY PROBLEMS (pain or burning when urinating, or frequent urination) *BOWEL PROBLEMS (unusual diarrhea, constipation, pain near the anus) TENDERNESS IN MOUTH AND THROAT WITH OR WITHOUT PRESENCE OF ULCERS (sore throat, sores in mouth, or a toothache) UNUSUAL RASH, SWELLING OR PAIN  UNUSUAL VAGINAL DISCHARGE OR ITCHING   Items with * indicate a potential emergency and should be followed up as soon as possible or go to the Emergency Department if any problems should occur.  Please show the CHEMOTHERAPY ALERT CARD or IMMUNOTHERAPY ALERT CARD at check-in to the  Emergency Department and triage nurse.  Should you have questions after your visit or need to cancel or reschedule your appointment, please contact [facility] [phone]  and follow the prompts.  Office hours are 8:00 a.m. to 4:30 p.m. Monday - Friday. Please note that voicemails left after 4:00 p.m. may not be returned until the following business day.  We are closed weekends and major holidays. You have access to a nurse at all times for urgent questions. Please call the main number to the clinic [phone] and follow the prompts.  For any non-urgent questions, you may also contact your provider using MyChart. We now offer e-Visits for anyone 56 and older to request care online for non-urgent symptoms. For details visit [email or web address].   Also download the MyChart app! Go to the app store, search "MyChart", open the app, select [facility], and log in with your MyChart username and password.  Due to Covid, a mask is required upon entering the hospital/clinic. If you do not have a mask, one will be given to you upon arrival. For doctor visits, patients may have 1 support person aged 42 or older with them. For treatment visits, patients cannot have anyone with them due to current Covid guidelines and our immunocompromised population.

## 2021-05-04 NOTE — Progress Notes (Signed)
[place], [place] [address]   CLINIC:  Medical Oncology/Hematology  PCP:  Lemmie Evens, MD [place]. 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   07/28/2014 Initial Diagnosis   Oropharyngeal cancer   08/03/2014 Imaging   CT CAP- L supraclavicular lymphadenopathy is not completely visualized. This is better seen on the previous neck CT from 07/27/2014. Otherwise, no evidence for metastatic disease in the chest, abdomen, or pelvis.   08/03/2014 Imaging   Bone scan- Uptake at adjacent anterior LEFT 6, 7, 8 ribs likely representing trauma/fractures. Questionable nonspecific increased tracer localization at the posterior RIGHT 8th and 9th ribs, the adjacent nature which raises a a question of trauma as well   08/06/2014 Pathology Results   Dr. Benjamine Mola- Oropharynx, biopsy, Left - INVASIVE SQUAMOUS CELL CARCINOMA.   08/12/2014 Procedure   Dr. Enrique Sack- 1. Multiple extraction of tooth numbers 6, 17, 22, 23, 24, 25, 26, and 27. 3 Quadrants of alveoloplasty   08/17/2014 Pathology Results   PORT and G-TUBE placed by Dr. Carlis Stable.   08/26/2014 PET scan   Large hypermetabolic mass in the left base of tongue. Activity extends across midline to the right base tongue. 2. Intensely hypermetabolic left cervical metastatic lymph nodes. Lymph nodes extend from the left level II position to the left supraclavi   09/01/2014 - 09/22/2014 Chemotherapy   Concurrent chemoradiation with Cisplatin 100 mg/m2 x 2 cycles with Neulasta  support. Held cycle #3 d/t renal toxicity.    09/03/2014 - 10/23/2014 Radiation Therapy   Treated in [place], IMRT Isidore Moos).  Base of tongue and bilat neck. Total dose: 70 Gy in 35 fractions. (of note, he did miss several treatments requiring BID dosing towards the end of treatment).    01/25/2015 PET scan   Near complete resolution of metabolic activity at the base of tongue. Minimal residual activity is likely post treatment effect. 2. Complete resolution of metabolic activity above LEFT cervical lymph nodes. No evidence of residual metabolically active    03/29/7251 Procedure   Port-a-cath removed [name])    05/03/2018 - 08/23/2018 Chemotherapy   The patient had dexamethasone (DECADRON) 4 MG tablet, 8 mg, Oral, Daily, 1 of 1 cycle, Start date: 05/01/2018, End date: 10/23/2018 palonosetron (ALOXI) injection 0.25 mg, 0.25 mg, Intravenous,  Once, 6 of 6 cycles Administration: 0.25 mg (05/03/2018), 0.25 mg (05/24/2018), 0.25 mg (06/14/2018), 0.25 mg (07/09/2018), 0.25 mg (07/30/2018), 0.25 mg (08/21/2018) pegfilgrastim-cbqv (UDENYCA) injection 6 mg, 6 mg, Subcutaneous, Once, 5 of 5 cycles Administration: 6 mg (05/27/2018), 6 mg (06/17/2018), 6 mg (07/11/2018), 6 mg (08/01/2018), 6 mg (08/23/2018) CARBOplatin (PARAPLATIN) 380 mg in sodium chloride 0.9 % 250 mL chemo infusion, 380 mg (100 % of original dose 381 mg), Intravenous,  Once, 6 of 6 cycles Dose modification:   (original dose 381 mg, Cycle 1),   (original dose 309.5 mg, Cycle 2), 307.5 mg (original dose 309.5 mg, Cycle 5) Administration: 380 mg (05/03/2018), 310 mg (05/24/2018), 310 mg (06/14/2018), 340 mg (07/09/2018), 310 mg (07/30/2018), 350 mg (08/21/2018) PACLitaxel (TAXOL) 330 mg in sodium chloride 0.9 %  500 mL chemo infusion (> 27m/m2), 175 mg/m2 = 330 mg (100 % of original dose 175 mg/m2), Intravenous,  Once, 6 of 6 cycles Dose modification: 175 mg/m2 (original dose 175 mg/m2, Cycle 1, Reason: Patient Age) Administration: 330 mg (05/03/2018), 330 mg (05/24/2018),  330 mg (06/14/2018), 330 mg (07/09/2018), 330 mg (07/30/2018), 330 mg (08/21/2018)   for chemotherapy treatment.     05/24/2018 -  Chemotherapy   Patient is on Treatment Plan : HEAD/NECK Pembrolizumab/Taxol/Carboplatin Q21D     Squamous cell lung cancer, left ([facility]  06/14/2018 Initial Diagnosis   Squamous cell lung cancer, left ([facility])     CANCER STAGING:  Cancer Staging  Oropharyngeal carcinoma ([facility] Staging form: Pharynx - Oropharynx, AJCC 7th Edition - Clinical: Stage IVA (T4a, N2b, M0) - Unsigned   INTERVAL HISTORY:  Alexander [name] a 71y.o. male, returns for routine follow-up and consideration for next cycle of chemotherapy. [name] last seen on 04/13/2021.  Due for cycle #49 of Keytruda today.   Overall, he tells me he has been feeling pretty well. He is drinking 2 Ensure daily. He denies tingling/numbness.   Overall, he feels ready for next cycle of chemo today.   REVIEW OF SYSTEMS:  Review of Systems  Constitutional:  Negative for appetite change and fatigue.  Musculoskeletal:  Positive for arthralgias (6/10 knees).  Neurological:  Negative for numbness.  All other systems reviewed and are negative.  PAST MEDICAL/SURGICAL HISTORY:  Past Medical History:  Diagnosis Date   GERD (gastroesophageal reflux disease)    Mass of neck    dx. oropharyngeal squamous cell carcinoma- Chemo. radiation planned   [place] 07/28/2014   dx. 3 weeks ago.- [place]. [facility] [place]   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[facility] 08/06/2014   SCCa of Left BOT   Past Surgical History:  Procedure Laterality Date   BIOPSY  01/15/2018   Procedure: BIOPSY;  Surgeon: FDanie Binder MD;  [facility];  Service: Endoscopy;;  gastric   COLONOSCOPY N/A 03/13/2016   Procedure: COLONOSCOPY;  Surgeon: SDanie Binder MD;  [facility];  Service: Endoscopy;  Laterality: N/A;  2:15 PM   ESOPHAGOGASTRODUODENOSCOPY (EGD) WITH PROPOFOL  N/A 08/17/2014   Procedure: ESOPHAGOGASTRODUODENOSCOPY (EGD) WITH PROPOFOL (procedure #1);  Surgeon: MAviva SignsMd, MD;  Location: [facility];  Service: General;  Laterality: N/A;   ESOPHAGOGASTRODUODENOSCOPY (EGD) WITH PROPOFOL N/A 01/15/2018   Procedure: ESOPHAGOGASTRODUODENOSCOPY (EGD) WITH PROPOFOL;  Surgeon: FDanie Binder MD;  [facility];  Service: Endoscopy;  Laterality: N/A;  9:30am   MULTIPLE EXTRACTIONS WITH ALVEOLOPLASTY N/A 08/12/2014   Procedure: Extraction of tooth #'s 6,17,22,23,24,25,26,27 with alveoloplasty;  Surgeon: RLenn Cal DDS;  [facility]: [facility];  Service: Oral Surgery;  Laterality: N/A;   PANENDOSCOPY N/A 08/06/2014   Procedure: PANENDOSCOPY WITH BIOPSY;  Surgeon: SLeta Baptist MD;  [facility]  Service: ENT;  Laterality: N/A;   PEG PLACEMENT Left 08/17/14   PEG PLACEMENT N/A 08/17/2014   Procedure: PERCUTANEOUS ENDOSCOPIC GASTROSTOMY (PEG) PLACEMENT (procedure #1);  Surgeon: MAviva SignsMd, MD;  Location: [facility];  Service: General;  Laterality: N/A;   PORT-A-CATH REMOVAL Right 07/17/2016   Procedure: MINOR REMOVAL PORT-A-CATH;  Surgeon: MAviva Signs MD;  Location: [facility];  Service: General;  Laterality: Right;   PORTACATH PLACEMENT Right 08/17/14   PORTACATH PLACEMENT Right 08/17/2014   Procedure: INSERTION PORT-A-CATH (procedure #2);  Surgeon: MAviva SignsMd, MD;  Location: [facility];  Service: General;  Laterality: Right;   PORTACATH PLACEMENT Left 04/26/2018   Procedure: INSERTION PORT-A-CATH (attached catheter in left subclavian);  Surgeon: Aviva Signs, MD;  Location: [facility];  Service: General;  Laterality: Left;   SAVORY DILATION N/A 01/15/2018   Procedure: SAVORY DILATION;  Surgeon: Danie Binder, MD;  [facility];  Service: Endoscopy;  Laterality: N/A;   VIDEO BRONCHOSCOPY WITH ENDOBRONCHIAL ULTRASOUND N/A 04/15/2018   Procedure: VIDEO BRONCHOSCOPY WITH ENDOBRONCHIAL ULTRASOUND;  Surgeon: Melrose Nakayama, MD;   Location: [facility] OR;  Service: Thoracic;  Laterality: N/A;    SOCIAL HISTORY:  Social History   Socioeconomic History   Marital status: Legally Separated    Spouse name: Not on file   Number of children: 5   Years of education: Not on file   Highest education level: Not on file  Occupational History   Not on file  Tobacco Use   Smoking status: Former    Packs/day: 0.50    Years: 30.00    Pack years: 15.00    Types: Cigarettes    Quit date: 07/22/2014    Years since quitting: 6.7   Smokeless tobacco: Never  Vaping Use   Vaping Use: Never used  Substance and Sexual Activity   Alcohol use: Not Currently    Alcohol/week: 0.0 standard drinks    Comment: None currently (11/09/17); previously 1-2 beers on the weekend   Drug use: No   Sexual activity: Not on file  Other Topics Concern   Not on file  Social History Narrative   Not on file   Social Determinants of Health   Financial Resource Strain: Not on file  Food Insecurity: Not on file  Transportation Needs: Not on file  Physical Activity: Not on file  Stress: Not on file  Social Connections: Not on file  Intimate Partner Violence: Not on file    FAMILY HISTORY:  Family History  Problem Relation Age of Onset   Colon cancer Neg Hx    Gastric cancer Neg Hx    Esophageal cancer Neg Hx     CURRENT MEDICATIONS:  Current Outpatient Medications  Medication Sig Dispense Refill   CARBOPLATIN IV Inject into the vein every 21 ( twenty-one) days.     feeding supplement, ENSURE ENLIVE, (ENSURE ENLIVE) LIQD Take 237 mLs by mouth 4 (four) times daily.      fluconazole (DIFLUCAN) 100 MG tablet Take 2 tablets (200 mg) by mouth on the first day then 1 tablet (100 mg) daily for the following 4 days 6 tablet 0   HYDROcodone-acetaminophen (NORCO) 10-325 MG tablet Take 1 tablet by mouth every 12 (twelve) hours as needed. 60 tablet 0   levothyroxine (SYNTHROID) 88 MCG tablet TAKE ONE TABLET BY MOUTH DAILY BEFORE BREAKFAST 30 tablet 2    lidocaine (XYLOCAINE) 2 % solution Use as directed 15 mLs in the mouth or throat as needed for mouth pain. 450 mL 0   Melatonin 10 MG TABS Take 1 tablet by mouth at bedtime.     naproxen sodium (ALEVE) 220 MG tablet Take 220 mg by mouth daily as needed.     omeprazole (PRILOSEC) 20 MG capsule TAKE 1 CAPSULE BY MOUTH 30 MINUTES PRIOR TO BREAKFAST 90 capsule 0   PACLITAXEL IV Inject into the vein every 21 ( twenty-one) days.     Pembrolizumab (KEYTRUDA IV) Inject into the vein every 21 ( twenty-one) days.     silodosin (RAPAFLO) 8 MG CAPS capsule Take 1 capsule (  8 mg total) by mouth daily with breakfast. 30 capsule 11   tadalafil (CIALIS) 20 MG tablet Take 1 tablet (20 mg total) by mouth daily as needed. 10 tablet 5   No current facility-administered medications for this visit.   Facility-Administered Medications Ordered in Other Visits  Medication Dose Route Frequency Provider Last Rate Last Admin   sodium chloride flush (NS) 0.9 % injection 10 mL  10 mL Intracatheter PRN Derek Jack, MD   10 mL at 06/24/19 2010    ALLERGIES:  No Known Allergies  PHYSICAL EXAM:  Performance status (ECOG): 1 - Symptomatic but completely ambulatory  There were no vitals filed for this visit. Wt Readings from Last 3 Encounters:  05/04/21 131 lb 11.2 oz (59.7 kg)  04/13/21 133 lb 12.8 oz (60.7 kg)  03/23/21 131 lb 3.2 oz (59.5 kg)   Physical Exam Vitals reviewed.  Constitutional:      Appearance: Normal appearance.  Cardiovascular:     Rate and Rhythm: Normal rate and regular rhythm.     Pulses: Normal pulses.     Heart sounds: Normal heart sounds.  Pulmonary:     Effort: Pulmonary effort is normal.     Breath sounds: Normal breath sounds.  Musculoskeletal:     Right lower leg: No edema.     Left lower leg: No edema.  Neurological:     General: No focal deficit present.     Mental Status: He is alert and oriented to person, place, and time.  Psychiatric:        Mood and Affect: Mood  normal.        Behavior: Behavior normal.    LABORATORY DATA:  I have reviewed the labs as listed.  CBC Latest Ref Rng & Units 05/04/2021 04/13/2021 03/23/2021  WBC 4.0 - 10.5 K/uL 3.5(L) 4.1 3.3(L)  Hemoglobin 13.0 - 17.0 g/dL 9.7(L) 10.2(L) 9.9(L)  Hematocrit 39.0 - 52.0 % 29.9(L) 32.0(L) 31.2(L)  Platelets 150 - 400 K/uL 155 196 165   CMP Latest Ref Rng & Units 05/04/2021 04/13/2021 03/23/2021  Glucose 70 - 99 mg/dL 86 99 89  BUN 8 - 23 mg/dL 35(H) 33(H) 22  Creatinine 0.61 - 1.24 mg/dL 1.87(H) 1.54(H) 1.38(H)  Sodium 135 - 145 mmol/L 138 140 139  Potassium 3.5 - 5.1 mmol/L 4.7 4.2 4.2  Chloride 98 - 111 mmol/L 106 105 107  CO2 22 - 32 mmol/L 25 26 26   Calcium 8.9 - 10.3 mg/dL 9.1 8.9 8.7(L)  Total Protein 6.5 - 8.1 g/dL 7.0 7.2 7.0  Total Bilirubin 0.3 - 1.2 mg/dL 0.7 0.5 0.1(L)  Alkaline Phos 38 - 126 U/L 61 68 68  AST 15 - 41 U/L 17 18 18   ALT 0 - 44 U/L 16 13 14     DIAGNOSTIC IMAGING:  I have independently reviewed the scans and discussed with the patient. No results found.   ASSESSMENT:  1.  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 -6 cycles of carboplatin, paclitaxel and pembrolizumab from 05/03/2018 through 08/21/2018. -Maintenance pembrolizumab started on 09/11/2018. -PET scan on 09/15/2019 showed interval decrease in hypermetabolic areas associated with tongue and floor of the mouth.  Hypermetabolic metastatic lymphadenopathy in the chest is stable.  No new sites seen. -PET scan on 03/15/2020 shows persistent, stable hypermetabolism in the tongue/floor of mouth.  Slight interval decrease in hypermetabolism with mediastinal/hilar adenopathy.  Persistent hypermetabolic focus in the right supraclavicular region.  New focus of  hypermetabolic them identified in the right external iliac chain of pelvis with no discernible adenopathy on the CT. -CT CAP on 11/24/2020 showed left lower lobe lung nodule  measuring 1.3 x 1.1 cm, previously 1.0 x 0.9 cm on CT scan from June.  Lesion was negative on PSMA PET scan.  Stable mediastinal lymph nodes. 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 - Cycle 1 of carboplatin, paclitaxel and pembrolizumab on 01/12/2021.   2.  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  3.  Prostate cancer: - Evaluated by Dr. Alyson Ingles and biopsy was recommended.  Patient declined.  4.  Left kidney mass: - Biopsy of the left kidney mass was consistent with metastatic squamous cell carcinoma.   PLAN:  1.  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 - He has not experienced any side effects from G-CSF injection. - Reviewed labs today.  White count is 3.5 with normal ANC.  Platelet count is normal.  LFTs are normal.  He will proceed with cycle 6 today. - Recommend CT CAP with contrast prior to next visit. - We will plan to maintain on Keytruda after this cycle if there is no progression.     2.  Hypothyroidism: - Continue Synthroid 88 mcg daily.  Last TSH was 2.4.  We will follow-up on today's value.   3.  Bilateral knee pains: - Continue hydrocodone 10 mg twice daily as needed.   4.  Nutrition: - Continue Ensure 350 cal twice daily.  Weight is stable.   5.  CKD: - Baseline creatinine 1.5-1.8.  Today 1.87.   Orders placed this encounter:  No orders of the defined types were placed in this encounter.    Derek Jack, MD [place] [phone]   I, Thana Ates, am acting as a scribe for Dr. Derek Jack.  I, Derek Jack MD, have reviewed the above documentation for accuracy and completeness, and I agree with the above.

## 2021-05-05 ENCOUNTER — Emergency Department (HOSPITAL_COMMUNITY)
Admission: EM | Admit: 2021-05-05 | Discharge: 2021-05-05 | Disposition: A | Discharge status: Home / Self Care | Payer: Medicare Other | Attending: Emergency Medicine | Admitting: Emergency Medicine

## 2021-05-05 ENCOUNTER — Encounter (HOSPITAL_COMMUNITY): Payer: Self-pay | Admitting: *Deleted

## 2021-05-05 DIAGNOSIS — R1030 Lower abdominal pain, unspecified: Secondary | ICD-10-CM | POA: Diagnosis not present

## 2021-05-05 DIAGNOSIS — R000 Tachycardia, unspecified: Secondary | ICD-10-CM | POA: Diagnosis not present

## 2021-05-05 DIAGNOSIS — R339 Retention of urine, unspecified: Secondary | ICD-10-CM | POA: Insufficient documentation

## 2021-05-05 LAB — URINALYSIS, ROUTINE W REFLEX MICROSCOPIC
Bilirubin Urine: NEGATIVE
Glucose, UA: NEGATIVE mg/dL
Hgb urine dipstick: NEGATIVE
Ketones, ur: NEGATIVE mg/dL
Leukocytes,Ua: NEGATIVE
Nitrite: NEGATIVE
Protein, ur: NEGATIVE mg/dL
Specific Gravity, Urine: 1.015 (ref 1.005–1.030)
pH: 5 (ref 5.0–8.0)

## 2021-05-05 NOTE — ED Triage Notes (Signed)
Took chemo yesterday, has the feeling to urinate and is unable to go

## 2021-05-05 NOTE — Discharge Instructions (Signed)
Your testing shows no signs of infection.  Please keep the catheter in for the next week, follow-up with Dr. Alyson Ingles ER for worsening symptoms

## 2021-05-05 NOTE — ED Provider Notes (Signed)
[facility] Provider Note   CSN: [id number] Arrival date & time: 05/05/21  1656     History  Chief Complaint  Patient presents with   Urinary Urgency    Alexander [name] is a 71 y.o. male.  HPI  Patient is a 71 year old male, he is currently on chemotherapy, he has a history of cancer and was taking chemotherapy yesterday, presents after not being able to urinate since last night.  He has had just a small amount of dribbling since that time.  Today he has had increasing discomfort in the suprapubic region with the inability to pass much more than a dropper to of urine.  He has never had this before, denies fevers or chills nausea or vomiting.  He does get sweaty at times today.  Increasing abdominal discomfort the longer this goes on.  Home Medications Prior to Admission medications   Medication Sig Start Date End Date Taking? Authorizing Provider  CARBOPLATIN IV Inject into the vein every 21 ( twenty-one) days.    [provider]  feeding supplement, ENSURE ENLIVE, (ENSURE ENLIVE) LIQD Take 237 mLs by mouth 4 (four) times daily.     [provider]  fluconazole (DIFLUCAN) 100 MG tablet Take 2 tablets (200 mg) by mouth on the first day then 1 tablet (100 mg) daily for the following 4 days 03/02/21   Derek Jack, MD  HYDROcodone-acetaminophen (NORCO) 10-325 MG tablet Take 1 tablet by mouth every 12 (twelve) hours as needed. 05/02/21   Derek Jack, MD  levothyroxine (SYNTHROID) 88 MCG tablet TAKE ONE TABLET BY MOUTH DAILY BEFORE BREAKFAST 04/21/21   Derek Jack, MD  lidocaine (XYLOCAINE) 2 % solution Use as directed 15 mLs in the mouth or throat as needed for mouth pain. 02/25/21   Derek Jack, MD  Melatonin 10 MG TABS Take 1 tablet by mouth at bedtime.    [provider]  naproxen sodium (ALEVE) 220 MG tablet Take 220 mg by mouth daily as needed.    [provider]  omeprazole (PRILOSEC) 20 MG  capsule TAKE 1 CAPSULE BY MOUTH 30 MINUTES PRIOR TO BREAKFAST 04/04/21   Annitta Needs, NP  PACLITAXEL IV Inject into the vein every 21 ( twenty-one) days.    [provider]  Pembrolizumab (KEYTRUDA IV) Inject into the vein every 21 ( twenty-one) days.    [provider]  silodosin (RAPAFLO) 8 MG CAPS capsule Take 1 capsule (8 mg total) by mouth daily with breakfast. 02/16/21   McKenzie, Candee Furbish, MD  tadalafil (CIALIS) 20 MG tablet Take 1 tablet (20 mg total) by mouth daily as needed. 02/16/21   McKenzie, Candee Furbish, MD  prochlorperazine (COMPAZINE) 10 MG tablet Take 1 tablet (10 mg total) by mouth every 6 (six) hours as needed (Nausea or vomiting). Patient not taking: Reported on 11/13/2018 05/01/18 12/04/18  Zoila Shutter, MD      Allergies    Patient has no known allergies.    Review of Systems   Review of Systems  All other systems reviewed and are negative.  Physical Exam Updated Vital Signs BP 136/75 (BP Location: Right Arm)    Pulse (!) 106    Temp 97.9 F (36.6 C) (Oral)    Resp 20    SpO2 100%  Physical Exam Vitals and nursing note reviewed.  Constitutional:      General: He is not in acute distress.    Appearance: He is well-developed.  HENT:     Head:  Normocephalic and atraumatic.     Mouth/Throat:     Pharynx: No oropharyngeal exudate.  Eyes:     General: No scleral icterus.       Right eye: No discharge.        Left eye: No discharge.     Conjunctiva/sclera: Conjunctivae normal.     Pupils: Pupils are equal, round, and reactive to light.  Neck:     Thyroid: No thyromegaly.     Vascular: No JVD.  Cardiovascular:     Rate and Rhythm: Regular rhythm. Tachycardia present.     Heart sounds: Normal heart sounds. No murmur heard.   No friction rub. No gallop.  Pulmonary:     Effort: Pulmonary effort is normal. No respiratory distress.     Breath sounds: Normal breath sounds. No wheezing or rales.  Abdominal:     General: Bowel sounds are normal. There  is no distension.     Palpations: Abdomen is soft. There is no mass.     Tenderness: There is no abdominal tenderness.  Genitourinary:    Comments: Normal-appearing uncircumcised penis, foreskin is easily retracted, head of the penis appears normal, urethral meatus is open and clear Musculoskeletal:        General: No tenderness. Normal range of motion.     Cervical back: Normal range of motion and neck supple.  Lymphadenopathy:     Cervical: No cervical adenopathy.  Skin:    General: Skin is warm and dry.     Findings: No erythema or rash.  Neurological:     Mental Status: He is alert.     Coordination: Coordination normal.  Psychiatric:        Behavior: Behavior normal.    ED Results / Procedures / Treatments   Labs (all labs ordered are listed, but only abnormal results are displayed) Labs Reviewed  URINE CULTURE  URINALYSIS, ROUTINE W REFLEX MICROSCOPIC    EKG None  Radiology No results found.  Procedures Procedures    Medications Ordered in ED Medications - No data to display  ED Course/ Medical Decision Making/ A&P                           Medical Decision Making Amount and/or Complexity of Data Reviewed Labs: ordered.   Patient has a normal-appearing penis, apparently has some urinary retention, will need Foley catheter placed, urinalysis and culture placed.  Patient agreeable  After Foley catheter placement the patient's symptoms have almost completely resolved.  He has no abdominal pain, no feelings to urinate, urine is totally clean without signs of infection.  Leg bag will be given, the patient can follow-up with Dr. Alyson Ingles who he already has an established relationship with.        Final Clinical Impression(s) / ED Diagnoses Final diagnoses:  Urinary retention     Noemi Chapel, MD 05/05/21 1921

## 2021-05-06 ENCOUNTER — Other Ambulatory Visit: Payer: Self-pay

## 2021-05-06 ENCOUNTER — Inpatient Hospital Stay (HOSPITAL_COMMUNITY): Payer: Medicare Other

## 2021-05-06 VITALS — BP 96/64 | HR 104 | Temp 97.2°F | Resp 18 | Wt 131.0 lb

## 2021-05-06 DIAGNOSIS — C109 Malignant neoplasm of oropharynx, unspecified: Secondary | ICD-10-CM

## 2021-05-06 DIAGNOSIS — Z5112 Encounter for antineoplastic immunotherapy: Secondary | ICD-10-CM | POA: Diagnosis not present

## 2021-05-06 MED ORDER — PEGFILGRASTIM-BMEZ 6 MG/0.6ML ~~LOC~~ SOSY
6.0000 mg | PREFILLED_SYRINGE | Freq: Once | SUBCUTANEOUS | Status: AC
  Administered 2021-05-06: 6 mg via SUBCUTANEOUS
  Filled 2021-05-06: qty 0.6

## 2021-05-06 NOTE — Progress Notes (Signed)
Patient presents today for Ziextenzo injection.  Patient is in satisfactory condition with no complaints voiced.  Vital signs are stable.  We will proceed with treatment per MD orders.   Patient tolerated injection with no complaints voiced.  Site clean and dry with no bruising or swelling noted.  No complaints of pain.  Discharged with vital signs stable and no signs or symptoms of distress noted.

## 2021-05-06 NOTE — Patient Instructions (Signed)
[facility]  Discharge Instructions: Thank you for choosing [name] to provide your oncology and hematology care.  If you have a lab appointment with the Cancer Center, please come in thru the Main Entrance and check in at the main information desk.  Wear comfortable clothing and clothing appropriate for easy access to any Portacath or PICC line.   We strive to give you quality time with your provider. You may need to reschedule your appointment if you arrive late (15 or more minutes).  Arriving late affects you and other patients whose appointments are after yours.  Also, if you miss three or more appointments without notifying the office, you may be dismissed from the clinic at the provider's discretion.      For prescription refill requests, have your pharmacy contact our office and allow 72 hours for refills to be completed.        To help prevent nausea and vomiting after your treatment, we encourage you to take your nausea medication as directed.  BELOW ARE SYMPTOMS THAT SHOULD BE REPORTED IMMEDIATELY: *FEVER GREATER THAN 100.4 F (38 C) OR HIGHER *CHILLS OR SWEATING *NAUSEA AND VOMITING THAT IS NOT CONTROLLED WITH YOUR NAUSEA MEDICATION *UNUSUAL SHORTNESS OF BREATH *UNUSUAL BRUISING OR BLEEDING *URINARY PROBLEMS (pain or burning when urinating, or frequent urination) *BOWEL PROBLEMS (unusual diarrhea, constipation, pain near the anus) TENDERNESS IN MOUTH AND THROAT WITH OR WITHOUT PRESENCE OF ULCERS (sore throat, sores in mouth, or a toothache) UNUSUAL RASH, SWELLING OR PAIN  UNUSUAL VAGINAL DISCHARGE OR ITCHING   Items with * indicate a potential emergency and should be followed up as soon as possible or go to the Emergency Department if any problems should occur.  Please show the CHEMOTHERAPY ALERT CARD or IMMUNOTHERAPY ALERT CARD at check-in to the Emergency Department and triage nurse.  Should you have questions after your visit or need to cancel  or reschedule your appointment, please contact [facility] [phone]  and follow the prompts.  Office hours are 8:00 a.m. to 4:30 p.m. Monday - Friday. Please note that voicemails left after 4:00 p.m. may not be returned until the following business day.  We are closed weekends and major holidays. You have access to a nurse at all times for urgent questions. Please call the main number to the clinic [phone] and follow the prompts.  For any non-urgent questions, you may also contact your provider using MyChart. We now offer e-Visits for anyone 18 and older to request care online for non-urgent symptoms. For details visit [email or web address].   Also download the MyChart app! Go to the app store, search "MyChart", open the app, select [facility], and log in with your MyChart username and password.  Due to Covid, a mask is required upon entering the hospital/clinic. If you do not have a mask, one will be given to you upon arrival. For doctor visits, patients may have 1 support person aged 18 or older with them. For treatment visits, patients cannot have anyone with them due to current Covid guidelines and our immunocompromised population.  

## 2021-05-07 LAB — URINE CULTURE: Culture: NO GROWTH

## 2021-05-11 ENCOUNTER — Ambulatory Visit (INDEPENDENT_AMBULATORY_CARE_PROVIDER_SITE_OTHER): Payer: Medicare Other | Admitting: Urology

## 2021-05-11 ENCOUNTER — Other Ambulatory Visit: Payer: Self-pay

## 2021-05-11 VITALS — BP 92/56 | HR 86

## 2021-05-11 DIAGNOSIS — R351 Nocturia: Secondary | ICD-10-CM | POA: Diagnosis not present

## 2021-05-11 DIAGNOSIS — N138 Other obstructive and reflux uropathy: Secondary | ICD-10-CM | POA: Diagnosis not present

## 2021-05-11 DIAGNOSIS — N401 Enlarged prostate with lower urinary tract symptoms: Secondary | ICD-10-CM

## 2021-05-11 DIAGNOSIS — R339 Retention of urine, unspecified: Secondary | ICD-10-CM | POA: Diagnosis not present

## 2021-05-11 MED ORDER — SILODOSIN 8 MG PO CAPS: 8.0000 mg | ORAL_CAPSULE | Freq: Two times a day (BID) | ORAL | 11 refills | Status: DC

## 2021-05-11 NOTE — Progress Notes (Signed)
05/11/2021 10:02 AM   Alexander [name] [place] 11-21-1950 [id number]  Referring provider: Lemmie Evens, MD [place],  [place] [address]  Followup BPH   HPI: Alexander [name] is a 71yo here for followup for BPh and with new urinary retention. He was previously on rapaflo 8mg  daily and was doing well until 1 week ago and his infusion. He developed an inability to urinate and presented to the ER. A foley was placed and he presents today for a voiding trial. His urine is clear. 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 3 weeks ago.- Dr. Oneal [facility] [place], [place].   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 Left BOT    Surgical History: Past Surgical History:  Procedure Laterality Date   BIOPSY  01/15/2018   Procedure: BIOPSY;  Surgeon: Danie Binder, MD;  [facility];  Service: Endoscopy;;  gastric   COLONOSCOPY N/A 03/13/2016   Procedure: COLONOSCOPY;  Surgeon: Danie Binder, MD;  [facility];  Service: Endoscopy;  Laterality: N/A;  2:15 PM   ESOPHAGOGASTRODUODENOSCOPY (EGD) WITH PROPOFOL N/A 08/17/2014   Procedure: ESOPHAGOGASTRODUODENOSCOPY (EGD) WITH PROPOFOL (procedure #1);  Surgeon: Aviva Signs Md, MD;  Location: [facility];  Service: General;  Laterality: N/A;   ESOPHAGOGASTRODUODENOSCOPY (EGD) WITH PROPOFOL N/A 01/15/2018   Procedure: ESOPHAGOGASTRODUODENOSCOPY (EGD) WITH PROPOFOL;  Surgeon: Danie Binder, MD;  [facility];  Service: Endoscopy;  Laterality: N/A;  9:30am   MULTIPLE EXTRACTIONS WITH ALVEOLOPLASTY N/A 08/12/2014   Procedure: Extraction of tooth #'s 6,17,22,23,24,25,26,27 with alveoloplasty;  Surgeon: Lenn Cal, DDS;  Location: [facility];  Service: Oral Surgery;  Laterality: N/A;    PANENDOSCOPY N/A 08/06/2014   Procedure: PANENDOSCOPY WITH BIOPSY;  Surgeon: Leta Baptist, MD;  Location: [facility];  Service: ENT;  Laterality: N/A;   PEG PLACEMENT Left 08/17/14   PEG PLACEMENT N/A 08/17/2014   Procedure: PERCUTANEOUS ENDOSCOPIC GASTROSTOMY (PEG) PLACEMENT (procedure #1);  Surgeon: Aviva Signs Md, MD;  Location: [facility];  Service: General;  Laterality: N/A;   PORT-A-CATH REMOVAL Right 07/17/2016   Procedure: MINOR REMOVAL PORT-A-CATH;  Surgeon: Aviva Signs, MD;  Location: [facility];  Service: General;  Laterality: Right;   PORTACATH PLACEMENT Right 08/17/14   PORTACATH PLACEMENT Right 08/17/2014   Procedure: INSERTION PORT-A-CATH (procedure #2);  Surgeon: Aviva Signs Md, MD;  Location: [facility];  Service: General;  Laterality: Right;   PORTACATH PLACEMENT Left 04/26/2018   Procedure: INSERTION PORT-A-CATH (attached catheter in left subclavian);  Surgeon: Aviva Signs, MD;  Location: [facility];  Service: General;  Laterality: Left;   SAVORY DILATION N/A 01/15/2018   Procedure: SAVORY DILATION;  Surgeon: Danie Binder, MD;  [facility];  Service: Endoscopy;  Laterality: N/A;   VIDEO BRONCHOSCOPY WITH ENDOBRONCHIAL ULTRASOUND N/A 04/15/2018   Procedure: VIDEO BRONCHOSCOPY WITH ENDOBRONCHIAL ULTRASOUND;  Surgeon: Melrose Nakayama, MD;  Location: [facility];  Service: Thoracic;  Laterality: N/A;    Home Medications:  Allergies as of 05/11/2021   No Known Allergies      Medication List        Accurate as of May 11, 2021 10:02 AM. If you have any questions, ask your nurse or doctor.  CARBOPLATIN IV Inject into the vein every 21 ( twenty-one) days.   feeding supplement Liqd Take 237 mLs by mouth 4 (four) times daily.   fluconazole 100 MG tablet Commonly known as: DIFLUCAN Take 2 tablets (200 mg) by mouth on the first day then 1 tablet (100 mg) daily for the following 4 days   HYDROcodone-acetaminophen 10-325 MG tablet Commonly known as:  NORCO Take 1 tablet by mouth every 12 (twelve) hours as needed.   KEYTRUDA IV Inject into the vein every 21 ( twenty-one) days.   levothyroxine 88 MCG tablet Commonly known as: SYNTHROID TAKE ONE TABLET BY MOUTH DAILY BEFORE BREAKFAST   lidocaine 2 % solution Commonly known as: XYLOCAINE Use as directed 15 mLs in the mouth or throat as needed for mouth pain.   Melatonin 10 MG Tabs Take 1 tablet by mouth at bedtime.   naproxen sodium 220 MG tablet Commonly known as: ALEVE Take 220 mg by mouth daily as needed.   omeprazole 20 MG capsule Commonly known as: PRILOSEC TAKE 1 CAPSULE BY MOUTH 30 MINUTES PRIOR TO BREAKFAST   PACLITAXEL IV Inject into the vein every 21 ( twenty-one) days.   silodosin 8 MG Caps capsule Commonly known as: RAPAFLO Take 1 capsule (8 mg total) by mouth daily with breakfast.   tadalafil 20 MG tablet Commonly known as: CIALIS Take 1 tablet (20 mg total) by mouth daily as needed.        Allergies: No Known Allergies  Family History: Family History  Problem Relation Age of Onset   Colon cancer Neg Hx    Gastric cancer Neg Hx    Esophageal cancer Neg Hx     Social History:  reports that he quit smoking about 6 years ago. His smoking use included cigarettes. He has a 15.00 pack-year smoking history. He has never used smokeless tobacco. He reports that he does not currently use alcohol. He reports that he does not use drugs.  ROS: All other review of systems were reviewed and are negative except what is noted above in HPI  Physical Exam: BP (!) 92/56    Pulse 86   Constitutional:  Alert and oriented, No acute distress. HEENT: [place] AT, moist mucus membranes.  Trachea midline, no masses. Cardiovascular: No clubbing, cyanosis, or edema. Respiratory: Normal respiratory effort, no increased work of breathing. GI: Abdomen is soft, nontender, nondistended, no abdominal masses GU: No CVA tenderness.  Lymph: No cervical or inguinal  lymphadenopathy. Skin: No rashes, bruises or suspicious lesions. Neurologic: Grossly intact, no focal deficits, moving all 4 extremities. Psychiatric: Normal mood and affect.  Laboratory Data: Lab Results  Component Value Date   WBC 3.5 (L) 05/04/2021   HGB 9.7 (L) 05/04/2021   HCT 29.9 (L) 05/04/2021   MCV 97.7 05/04/2021   PLT 155 05/04/2021    Lab Results  Component Value Date   CREATININE 1.87 (H) 05/04/2021    No results found for: PSA  No results found for: TESTOSTERONE  No results found for: HGBA1C  Urinalysis    Component Value Date/Time   COLORURINE YELLOW 05/05/2021 1841   APPEARANCEUR CLEAR 05/05/2021 1841   APPEARANCEUR Clear 12/15/2020 1008   LABSPEC 1.015 05/05/2021 1841   PHURINE 5.0 05/05/2021 1841   GLUCOSEU NEGATIVE 05/05/2021 1841   HGBUR NEGATIVE 05/05/2021 1841   BILIRUBINUR NEGATIVE 05/05/2021 1841   BILIRUBINUR Negative 12/15/2020 [place] 05/05/2021 1841   PROTEINUR NEGATIVE 05/05/2021 1841   NITRITE NEGATIVE 05/05/2021 1841   LEUKOCYTESUR  NEGATIVE 05/05/2021 1841    Lab Results  Component Value Date   LABMICR See below: 12/15/2020   WBCUA 0-5 12/15/2020   LABEPIT 0-10 12/15/2020   BACTERIA None seen 12/15/2020    Pertinent Imaging:  No results found for this or any previous visit.  No results found for this or any previous visit.  No results found for this or any previous visit.  No results found for this or any previous visit.  No results found for this or any previous visit.  No results found for this or any previous visit.  No results found for this or any previous visit.  No results found for this or any previous visit.   Assessment & Plan:    1. Benign prostatic hyperplasia with urinary obstruction -Increase rapaflo to 8mg  BID  2. Urinary retention -Increase rapaflo to 8mg  BID. RTC 1 week for a voiding trial   No follow-ups on file.  [name], MD  [facility] [place]

## 2021-05-16 ENCOUNTER — Other Ambulatory Visit: Payer: Self-pay

## 2021-05-17 ENCOUNTER — Encounter: Payer: Self-pay | Admitting: Urology

## 2021-05-17 NOTE — Patient Instructions (Signed)

## 2021-05-18 ENCOUNTER — Other Ambulatory Visit: Payer: Medicare Other | Admitting: Urology

## 2021-05-19 ENCOUNTER — Ambulatory Visit (HOSPITAL_COMMUNITY): Payer: Medicare Other

## 2021-05-23 ENCOUNTER — Encounter: Payer: Self-pay | Admitting: Urology

## 2021-05-23 ENCOUNTER — Other Ambulatory Visit: Payer: Self-pay

## 2021-05-23 ENCOUNTER — Ambulatory Visit (INDEPENDENT_AMBULATORY_CARE_PROVIDER_SITE_OTHER): Payer: Medicare Other | Admitting: Urology

## 2021-05-23 VITALS — BP 135/83 | HR 70 | Wt 132.3 lb

## 2021-05-23 DIAGNOSIS — R339 Retention of urine, unspecified: Secondary | ICD-10-CM

## 2021-05-23 DIAGNOSIS — N138 Other obstructive and reflux uropathy: Secondary | ICD-10-CM

## 2021-05-23 DIAGNOSIS — N401 Enlarged prostate with lower urinary tract symptoms: Secondary | ICD-10-CM

## 2021-05-24 ENCOUNTER — Ambulatory Visit (INDEPENDENT_AMBULATORY_CARE_PROVIDER_SITE_OTHER): Payer: Medicare Other | Admitting: Urology

## 2021-05-24 ENCOUNTER — Ambulatory Visit: Payer: Medicare Other | Admitting: Urology

## 2021-05-24 VITALS — BP 94/57 | HR 78 | Ht 73.0 in | Wt 135.0 lb

## 2021-05-24 DIAGNOSIS — R339 Retention of urine, unspecified: Secondary | ICD-10-CM

## 2021-05-24 DIAGNOSIS — N401 Enlarged prostate with lower urinary tract symptoms: Secondary | ICD-10-CM

## 2021-05-24 LAB — BLADDER SCAN AMB NON-IMAGING: Scan Result: 96

## 2021-05-24 NOTE — Progress Notes (Signed)
Fill and Pull Catheter Removal  Patient is present today for a catheter removal.  Patient was cleaned and prepped in a sterile fashion 125ml of sterile water/ saline was instilled into the bladder when the patient felt the urge to urinate. 20ml of water was then drained from the balloon.  A 16 coudeFR foley cath was removed from the bladder no complications were noted .  Patient as then given some time to void on their own.  Patient can void  70ml on their own after some time.  Patient tolerated well.  Performed by: Estill Bamberg RN  Follow up/ Additional notes: patient will return this afternoon for PVR check

## 2021-05-24 NOTE — Progress Notes (Signed)
post void residual= 49mL

## 2021-05-25 ENCOUNTER — Ambulatory Visit (INDEPENDENT_AMBULATORY_CARE_PROVIDER_SITE_OTHER): Payer: Medicare Other

## 2021-05-25 DIAGNOSIS — R339 Retention of urine, unspecified: Secondary | ICD-10-CM | POA: Diagnosis not present

## 2021-05-25 LAB — BLADDER SCAN AMB NON-IMAGING: Scan Result: 350

## 2021-05-25 NOTE — Progress Notes (Signed)
Pt here today for bladder scan. Bladder was scanned and greater than 350 was visualized.  ? ?Simple Catheter Placement ? ?Due to urinary retention patient is present today for a foley cath placement.  Patient was cleaned and prepped in a sterile fashion with betadine. A 16 FR coude foley catheter was inserted, urine return was noted  312ml, urine was yellow in color.  The balloon was filled with 10cc of sterile water.  A leg bag was attached for drainage. Patient was also given a night bag to take home and was given instruction on how to change from one bag to another.  Patient was given instruction on proper catheter care.  Patient tolerated well, no complications were noted  ? ?Performed by: Estill Bamberg RN ? ?Additional notes/ Follow up: 2 week with Dr. Alyson Ingles for cystoscopy Patient states he recently doubled his rapaflo to twice daily. ( Reviewed with Dr. Alyson Ingles ? ?Performed by Estill Bamberg RN ? ? ? ?

## 2021-05-26 ENCOUNTER — Ambulatory Visit (HOSPITAL_COMMUNITY): Payer: Medicare Other | Admitting: Hematology

## 2021-05-26 ENCOUNTER — Other Ambulatory Visit (HOSPITAL_COMMUNITY): Payer: Medicare Other

## 2021-05-26 ENCOUNTER — Ambulatory Visit (HOSPITAL_COMMUNITY): Payer: Medicare Other

## 2021-05-26 ENCOUNTER — Other Ambulatory Visit: Payer: Self-pay

## 2021-05-26 ENCOUNTER — Inpatient Hospital Stay (HOSPITAL_COMMUNITY): Payer: Medicare Other | Attending: Hematology | Admitting: Dietician

## 2021-05-26 DIAGNOSIS — Z923 Personal history of irradiation: Secondary | ICD-10-CM | POA: Insufficient documentation

## 2021-05-26 DIAGNOSIS — M25562 Pain in left knee: Secondary | ICD-10-CM | POA: Insufficient documentation

## 2021-05-26 DIAGNOSIS — Z9221 Personal history of antineoplastic chemotherapy: Secondary | ICD-10-CM | POA: Insufficient documentation

## 2021-05-26 DIAGNOSIS — C61 Malignant neoplasm of prostate: Secondary | ICD-10-CM | POA: Insufficient documentation

## 2021-05-26 DIAGNOSIS — N189 Chronic kidney disease, unspecified: Secondary | ICD-10-CM | POA: Insufficient documentation

## 2021-05-26 DIAGNOSIS — Z79890 Hormone replacement therapy (postmenopausal): Secondary | ICD-10-CM | POA: Insufficient documentation

## 2021-05-26 DIAGNOSIS — Z85810 Personal history of malignant neoplasm of tongue: Secondary | ICD-10-CM | POA: Insufficient documentation

## 2021-05-26 DIAGNOSIS — C3492 Malignant neoplasm of unspecified part of left bronchus or lung: Secondary | ICD-10-CM | POA: Insufficient documentation

## 2021-05-26 DIAGNOSIS — Z87891 Personal history of nicotine dependence: Secondary | ICD-10-CM | POA: Insufficient documentation

## 2021-05-26 DIAGNOSIS — M25561 Pain in right knee: Secondary | ICD-10-CM | POA: Insufficient documentation

## 2021-05-26 DIAGNOSIS — C7902 Secondary malignant neoplasm of left kidney and renal pelvis: Secondary | ICD-10-CM | POA: Insufficient documentation

## 2021-05-26 DIAGNOSIS — E039 Hypothyroidism, unspecified: Secondary | ICD-10-CM | POA: Insufficient documentation

## 2021-05-26 NOTE — Progress Notes (Signed)
Nutrition Follow-up: ? ?Patient with SCC of left lung. He is currently receiving Keytruda q3 weeks.  ? ?Met with patient in clinic. He reports having a rough couple of weeks secondary to urinary retention requiring foley cath placement. This was removed 2/28, however symptoms returned and second foley catheter placed 3/1. Patient reports appetite is good and eating 3-4 meals daily. Yesterday he ate 2 corndogs for breakfast, 2 bologna sandwiches with bag of chips for lunch, hungry man dinner with steak/gravy, mashed potatoes and peas. Patient usually has bowl of ice cream for dessert. He goes through 2-3 gallons of fudge ripple in 2 weeks. Patient is drinking more water than he ever has, reports 5-6 bottles daily. Patient adding flavoring drops which has helped to increase intake. Patient drinks Ensure when he can afford to get them. He is requesting assistance with a case today. Patient denies nausea, vomiting, diarrhea, constipation. ? ?Medications: Rapaflo, Norco ? ?Labs: reviewed ? ?Anthropometrics: Weight 135 lb on 2/28 increased  ? ?2/8 - 131 lb 11.2 oz ?1/8 - 133 lb 12.8 oz ?12/8 - 131 lb 3.2 oz  ? ? ?NUTRITION DIAGNOSIS: Severe malnutrition continues, however improving  ? ? ?INTERVENTION:  ?Continue strategies for increasing calories and protein with small frequent meals and snacks ?Continue eating high calorie high protein foods to promote weight gain ?Continue drinking 1-2 Ensure Plus High Protein daily ?One complimentary case of Ensure Plus High Protein provided  ? ?  ? ?MONITORING, EVALUATION, GOAL: weight trends, intake ? ? ?NEXT VISIT: To be scheduled with treatment  ? ? ? ?

## 2021-05-28 ENCOUNTER — Encounter: Payer: Self-pay | Admitting: Urology

## 2021-05-28 NOTE — Patient Instructions (Signed)
Acute Urinary Retention, Male Acute urinary retention is when a person cannot pee (urinate) at all, or can only pee a little. This can come on all of a sudden. If it is not treated, it can lead to kidney problems or other serious problems. What are the causes? A problem with the tube that drains the bladder (urethra). Problems with the nerves in the bladder. Tumors. Certain medicines. An infection. Having trouble pooping (constipation). What increases the risk? Older men are more at risk because their prostate gland may become larger as they age. Other conditions also can increase risk. These include: Diseases, such as multiple sclerosis. Injury to the spinal cord. Diabetes. A condition that affects the way the brain works, such as dementia. Holding back urine due to trauma or because you do not want to use the bathroom. What are the signs or symptoms? Trouble peeing. Pain in the lower belly. How is this treated? Treatment for this condition may include: Medicines. Placing a thin, germ-free tube (catheter) into the bladder to drain pee out of the body. Therapy to treat mental health conditions. Treatment for conditions that may cause this. If needed, you may be treated in the hospital for kidney problems or to manage other problems. Follow these instructions at home: Medicines Take over-the-counter and prescription medicines only as told by your doctor. Ask your doctor what medicines you should stay away from. If you were given an antibiotic medicine, take it as told by your doctor. Do not stop taking it, even if you start to feel better. General instructions Do not smoke or use any products that contain nicotine or tobacco. If you need help quitting, ask your doctor. Drink enough fluid to keep your pee pale yellow. If you were sent home with a tube that drains the bladder, take care of it as told by your doctor. Watch for changes in your symptoms. Tell your doctor about them. If  told, keep track of changes in your blood pressure at home. Tell your doctor about them. Keep all follow-up visits. Contact a doctor if: You have spasms in your bladder that you cannot stop. You leak pee when you have spasms. Get help right away if: You have chills or a fever. You have blood in your pee. You have a tube that drains pee from the bladder and these things happen: The tube stops draining pee. The tube falls out. Summary Acute urinary retention is when you cannot pee at all or you pee too little. If this condition is not treated, it can lead to kidney problems or other serious problems. If you were sent home with a tube (catheter) that drains the bladder, take care of it as told by your doctor. Watch for changes in your symptoms. Tell your doctor about them. This information is not intended to replace advice given to you by your health care provider. Make sure you discuss any questions you have with your health care provider. Document Revised: 12/03/2019 Document Reviewed: 12/03/2019 Elsevier Patient Education  2022 [employer].

## 2021-05-28 NOTE — Progress Notes (Signed)
Patient reschedule to tomorrow morning for a voiding trial

## 2021-05-28 NOTE — Progress Notes (Signed)
05/24/2021 8:18 PM   Alexander [name] Worthy Flank 1951/02/04 [id number]  Referring provider: Lemmie Evens, MD [place],  [place] [id number]  Followup urinary retention   HPI: [name] is a 71yo here for followup for BPH and urinary retention. He started rapaflo 8mg  BID for the past week. Voiding trial passed today. Urine clear., No other complaints today.    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 3 weeks ago.- Dr. Oneal Deputy center [place], [place].   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   SCCa of Left BOT    Surgical History: Past Surgical History:  Procedure Laterality Date   BIOPSY  01/15/2018   Procedure: BIOPSY;  Surgeon: Danie Binder, MD;  [facility];  Service: Endoscopy;;  gastric   COLONOSCOPY N/A 03/13/2016   Procedure: COLONOSCOPY;  Surgeon: Danie Binder, MD;  [facility];  Service: Endoscopy;  Laterality: N/A;  2:15 PM   ESOPHAGOGASTRODUODENOSCOPY (EGD) WITH PROPOFOL N/A 08/17/2014   Procedure: ESOPHAGOGASTRODUODENOSCOPY (EGD) WITH PROPOFOL (procedure #1);  Surgeon: Aviva Signs Md, MD;  Location: [facility];  Service: General;  Laterality: N/A;   ESOPHAGOGASTRODUODENOSCOPY (EGD) WITH PROPOFOL N/A 01/15/2018   Procedure: ESOPHAGOGASTRODUODENOSCOPY (EGD) WITH PROPOFOL;  Surgeon: Danie Binder, MD;  [facility];  Service: Endoscopy;  Laterality: N/A;  9:30am   MULTIPLE EXTRACTIONS WITH ALVEOLOPLASTY N/A 08/12/2014   Procedure: Extraction of tooth #'s 6,17,22,23,24,25,26,27 with alveoloplasty;  Surgeon: Lenn Cal, DDS;  [facility];  Service: Oral Surgery;  Laterality: N/A;   PANENDOSCOPY N/A 08/06/2014   Procedure: PANENDOSCOPY WITH BIOPSY;  Surgeon: Leta Baptist, MD;  Location: [facility];  Service: ENT;   Laterality: N/A;   PEG PLACEMENT Left 08/17/14   PEG PLACEMENT N/A 08/17/2014   Procedure: PERCUTANEOUS ENDOSCOPIC GASTROSTOMY (PEG) PLACEMENT (procedure #1);  Surgeon: Aviva Signs Md, MD;  [facility];  Service: General;  Laterality: N/A;   PORT-A-CATH REMOVAL Right 07/17/2016   Procedure: MINOR REMOVAL PORT-A-CATH;  Surgeon: Aviva Signs, MD;  [facility];  Service: General;  Laterality: Right;   PORTACATH PLACEMENT Right 08/17/14   PORTACATH PLACEMENT Right 08/17/2014   Procedure: INSERTION PORT-A-CATH (procedure #2);  Surgeon: Aviva Signs Md, MD;  [facility];  Service: General;  Laterality: Right;   PORTACATH PLACEMENT Left 04/26/2018   Procedure: INSERTION PORT-A-CATH (attached catheter in left subclavian);  Surgeon: Aviva Signs, MD;  [facility];  Service: General;  Laterality: Left;   SAVORY DILATION N/A 01/15/2018   Procedure: SAVORY DILATION;  Surgeon: Danie Binder, MD;  [facility];  Service: Endoscopy;  Laterality: N/A;   VIDEO BRONCHOSCOPY WITH ENDOBRONCHIAL ULTRASOUND N/A 04/15/2018   Procedure: VIDEO BRONCHOSCOPY WITH ENDOBRONCHIAL ULTRASOUND;  Surgeon: Melrose Nakayama, MD;  [facility];  Service: Thoracic;  Laterality: N/A;    Home Medications:  Allergies as of 05/24/2021   No Known Allergies      Medication List        Accurate as of May 24, 2021 11:59 PM. If you have any questions, ask your nurse or doctor.          CARBOPLATIN IV Inject into the vein every 21 ( twenty-one) days.   feeding supplement Liqd Take 237 mLs by mouth 4 (four) times  daily.   fluconazole 100 MG tablet Commonly known as: DIFLUCAN Take 2 tablets (200 mg) by mouth on the first day then 1 tablet (100 mg) daily for the following 4 days   HYDROcodone-acetaminophen 10-325 MG tablet Commonly known as: NORCO Take 1 tablet by mouth every 12 (twelve) hours as needed.   KEYTRUDA IV Inject into the vein every 21 ( twenty-one) days.    levothyroxine 88 MCG tablet Commonly known as: SYNTHROID TAKE ONE TABLET BY MOUTH DAILY BEFORE BREAKFAST   lidocaine 2 % solution Commonly known as: XYLOCAINE Use as directed 15 mLs in the mouth or throat as needed for mouth pain.   Melatonin 10 MG Tabs Take 1 tablet by mouth at bedtime.   naproxen sodium 220 MG tablet Commonly known as: ALEVE Take 220 mg by mouth daily as needed.   omeprazole 20 MG capsule Commonly known as: PRILOSEC TAKE 1 CAPSULE BY MOUTH 30 MINUTES PRIOR TO BREAKFAST   PACLITAXEL IV Inject into the vein every 21 ( twenty-one) days.   silodosin 8 MG Caps capsule Commonly known as: RAPAFLO Take 1 capsule (8 mg total) by mouth 2 (two) times daily.   tadalafil 20 MG tablet Commonly known as: CIALIS Take 1 tablet (20 mg total) by mouth daily as needed.        Allergies: No Known Allergies  Family History: Family History  Problem Relation Age of Onset   Colon cancer Neg Hx    Gastric cancer Neg Hx    Esophageal cancer Neg Hx     Social History:  reports that he quit smoking about 6 years ago. His smoking use included cigarettes. He has a 15.00 pack-year smoking history. He has never used smokeless tobacco. He reports that he does not currently use alcohol. He reports that he does not use drugs.  ROS: All other review of systems were reviewed and are negative except what is noted above in HPI  Physical Exam: BP (!) 94/57    Pulse 78    Ht 6\' 1"  (1.854 m)    Wt 135 lb (61.2 kg)    BMI 17.81 kg/m   Constitutional:  Alert and oriented, No acute distress. HEENT: [place] AT, moist mucus membranes.  Trachea midline, no masses. Cardiovascular: No clubbing, cyanosis, or edema. Respiratory: Normal respiratory effort, no increased work of breathing. GI: Abdomen is soft, nontender, nondistended, no abdominal masses GU: No CVA tenderness.  Lymph: No cervical or inguinal lymphadenopathy. Skin: No rashes, bruises or suspicious lesions. Neurologic: Grossly  intact, no focal deficits, moving all 4 extremities. Psychiatric: Normal mood and affect.  Laboratory Data: Lab Results  Component Value Date   WBC 3.5 (L) 05/04/2021   HGB 9.7 (L) 05/04/2021   HCT 29.9 (L) 05/04/2021   MCV 97.7 05/04/2021   PLT 155 05/04/2021    Lab Results  Component Value Date   CREATININE 1.87 (H) 05/04/2021    No results found for: PSA  No results found for: TESTOSTERONE  No results found for: HGBA1C  Urinalysis    Component Value Date/Time   COLORURINE YELLOW 05/05/2021 1841   APPEARANCEUR CLEAR 05/05/2021 1841   APPEARANCEUR Clear 12/15/2020 1008   LABSPEC 1.015 05/05/2021 1841   PHURINE 5.0 05/05/2021 1841   GLUCOSEU NEGATIVE 05/05/2021 1841   HGBUR NEGATIVE 05/05/2021 1841   BILIRUBINUR NEGATIVE 05/05/2021 1841   BILIRUBINUR Negative 12/15/2020 1008   [place] 05/05/2021 1841   PROTEINUR NEGATIVE 05/05/2021 1841   NITRITE NEGATIVE 05/05/2021 1841   LEUKOCYTESUR NEGATIVE  05/05/2021 1841    Lab Results  Component Value Date   LABMICR See below: 12/15/2020   WBCUA 0-5 12/15/2020   LABEPIT 0-10 12/15/2020   BACTERIA None seen 12/15/2020    Pertinent Imaging:  No results found for this or any previous visit.  No results found for this or any previous visit.  No results found for this or any previous visit.  No results found for this or any previous visit.  No results found for this or any previous visit.  No results found for this or any previous visit.  No results found for this or any previous visit.  No results found for this or any previous visit.   Assessment & Plan:    1. Urinary retention -Voiding trial passed today. Continue rapaflo 8mg  BID - BLADDER SCAN AMB NON-IMAGING   No follow-ups on file.  [name], MD  [facility] [place]

## 2021-05-30 ENCOUNTER — Other Ambulatory Visit (HOSPITAL_COMMUNITY): Payer: Self-pay | Admitting: *Deleted

## 2021-05-30 MED ORDER — HYDROCODONE-ACETAMINOPHEN 10-325 MG PO TABS: 1.0000 | ORAL_TABLET | Freq: Two times a day (BID) | ORAL | 0 refills | Status: DC | PRN

## 2021-06-02 ENCOUNTER — Other Ambulatory Visit: Payer: Self-pay

## 2021-06-02 ENCOUNTER — Ambulatory Visit (HOSPITAL_COMMUNITY)
Admission: RE | Admit: 2021-06-02 | Discharge: 2021-06-02 | Disposition: A | Discharge status: Home / Self Care | Payer: Medicare Other | Source: Ambulatory Visit | Attending: Hematology | Admitting: Hematology

## 2021-06-02 DIAGNOSIS — C3492 Malignant neoplasm of unspecified part of left bronchus or lung: Secondary | ICD-10-CM

## 2021-06-02 MED ORDER — IOHEXOL 300 MG/ML  SOLN
100.0000 mL | Freq: Once | INTRAMUSCULAR | Status: AC | PRN
  Administered 2021-06-02: 13:00:00 80 mL via INTRAVENOUS

## 2021-06-02 MED ORDER — HEPARIN SOD (PORK) LOCK FLUSH 100 UNIT/ML IV SOLN
500.0000 [IU] | Freq: Once | INTRAVENOUS | Status: AC
  Administered 2021-06-02: 13:00:00 500 [IU] via INTRAVENOUS

## 2021-06-02 MED ORDER — SODIUM CHLORIDE (PF) 0.9 % IJ SOLN
INTRAMUSCULAR | Status: AC
  Filled 2021-06-02: qty 50

## 2021-06-02 MED ORDER — HEPARIN SOD (PORK) LOCK FLUSH 100 UNIT/ML IV SOLN
INTRAVENOUS | Status: AC
  Filled 2021-06-02: qty 5

## 2021-06-08 ENCOUNTER — Other Ambulatory Visit: Payer: Self-pay

## 2021-06-08 ENCOUNTER — Inpatient Hospital Stay (HOSPITAL_COMMUNITY): Payer: Medicare Other

## 2021-06-08 ENCOUNTER — Inpatient Hospital Stay (HOSPITAL_BASED_OUTPATIENT_CLINIC_OR_DEPARTMENT_OTHER): Payer: Medicare Other | Admitting: Hematology

## 2021-06-08 ENCOUNTER — Other Ambulatory Visit: Payer: Medicare Other | Admitting: Urology

## 2021-06-08 ENCOUNTER — Encounter (HOSPITAL_COMMUNITY): Payer: Self-pay | Admitting: Hematology

## 2021-06-08 VITALS — BP 109/72 | HR 86 | Temp 97.3°F | Resp 18 | Ht 73.0 in | Wt 134.2 lb

## 2021-06-08 DIAGNOSIS — M25561 Pain in right knee: Secondary | ICD-10-CM | POA: Diagnosis not present

## 2021-06-08 DIAGNOSIS — Z87891 Personal history of nicotine dependence: Secondary | ICD-10-CM | POA: Diagnosis not present

## 2021-06-08 DIAGNOSIS — C3492 Malignant neoplasm of unspecified part of left bronchus or lung: Secondary | ICD-10-CM

## 2021-06-08 DIAGNOSIS — C109 Malignant neoplasm of oropharynx, unspecified: Secondary | ICD-10-CM

## 2021-06-08 DIAGNOSIS — Z923 Personal history of irradiation: Secondary | ICD-10-CM | POA: Diagnosis not present

## 2021-06-08 DIAGNOSIS — Z79890 Hormone replacement therapy (postmenopausal): Secondary | ICD-10-CM | POA: Diagnosis not present

## 2021-06-08 DIAGNOSIS — N189 Chronic kidney disease, unspecified: Secondary | ICD-10-CM | POA: Diagnosis not present

## 2021-06-08 DIAGNOSIS — E039 Hypothyroidism, unspecified: Secondary | ICD-10-CM | POA: Diagnosis not present

## 2021-06-08 DIAGNOSIS — C7902 Secondary malignant neoplasm of left kidney and renal pelvis: Secondary | ICD-10-CM | POA: Diagnosis not present

## 2021-06-08 DIAGNOSIS — M25562 Pain in left knee: Secondary | ICD-10-CM | POA: Diagnosis not present

## 2021-06-08 DIAGNOSIS — Z85810 Personal history of malignant neoplasm of tongue: Secondary | ICD-10-CM | POA: Diagnosis not present

## 2021-06-08 DIAGNOSIS — Z9221 Personal history of antineoplastic chemotherapy: Secondary | ICD-10-CM | POA: Diagnosis not present

## 2021-06-08 DIAGNOSIS — C61 Malignant neoplasm of prostate: Secondary | ICD-10-CM | POA: Diagnosis not present

## 2021-06-08 LAB — CBC WITH DIFFERENTIAL/PLATELET
Abs Immature Granulocytes: 0.01 10*3/uL (ref 0.00–0.07)
Basophils Absolute: 0 10*3/uL (ref 0.0–0.1)
Basophils Relative: 1 %
Eosinophils Absolute: 0.1 10*3/uL (ref 0.0–0.5)
Eosinophils Relative: 2 %
HCT: 33.4 % — ABNORMAL LOW (ref 39.0–52.0)
Hemoglobin: 10.7 g/dL — ABNORMAL LOW (ref 13.0–17.0)
Immature Granulocytes: 0 %
Lymphocytes Relative: 19 %
Lymphs Abs: 0.8 10*3/uL (ref 0.7–4.0)
MCH: 31.8 pg (ref 26.0–34.0)
MCHC: 32 g/dL (ref 30.0–36.0)
MCV: 99.1 fL (ref 80.0–100.0)
Monocytes Absolute: 0.4 10*3/uL (ref 0.1–1.0)
Monocytes Relative: 10 %
Neutro Abs: 2.9 10*3/uL (ref 1.7–7.7)
Neutrophils Relative %: 68 %
Platelets: 175 10*3/uL (ref 150–400)
RBC: 3.37 MIL/uL — ABNORMAL LOW (ref 4.22–5.81)
RDW: 16.3 % — ABNORMAL HIGH (ref 11.5–15.5)
WBC: 4.3 10*3/uL (ref 4.0–10.5)
nRBC: 0 % (ref 0.0–0.2)

## 2021-06-08 LAB — COMPREHENSIVE METABOLIC PANEL
ALT: 24 U/L (ref 0–44)
AST: 24 U/L (ref 15–41)
Albumin: 3.8 g/dL (ref 3.5–5.0)
Alkaline Phosphatase: 63 U/L (ref 38–126)
Anion gap: 9 (ref 5–15)
BUN: 43 mg/dL — ABNORMAL HIGH (ref 8–23)
CO2: 26 mmol/L (ref 22–32)
Calcium: 9 mg/dL (ref 8.9–10.3)
Chloride: 104 mmol/L (ref 98–111)
Creatinine, Ser: 1.57 mg/dL — ABNORMAL HIGH (ref 0.61–1.24)
GFR, Estimated: 47 mL/min — ABNORMAL LOW (ref >60)
Glucose, Bld: 114 mg/dL — ABNORMAL HIGH (ref 70–99)
Potassium: 4.5 mmol/L (ref 3.5–5.1)
Sodium: 139 mmol/L (ref 135–145)
Total Bilirubin: 0.2 mg/dL — ABNORMAL LOW (ref 0.3–1.2)
Total Protein: 7.3 g/dL (ref 6.5–8.1)

## 2021-06-08 LAB — MAGNESIUM: Magnesium: 2.2 mg/dL (ref 1.7–2.4)

## 2021-06-08 LAB — TSH: TSH: 1.745 u[IU]/mL (ref 0.350–4.500)

## 2021-06-08 MED ORDER — HEPARIN SOD (PORK) LOCK FLUSH 100 UNIT/ML IV SOLN
500.0000 [IU] | Freq: Once | INTRAVENOUS | Status: AC
  Administered 2021-06-08: 500 [IU] via INTRAVENOUS

## 2021-06-08 MED ORDER — SODIUM CHLORIDE 0.9% FLUSH
10.0000 mL | Freq: Once | INTRAVENOUS | Status: AC
  Administered 2021-06-08: 10 mL via INTRAVENOUS

## 2021-06-08 NOTE — Progress Notes (Signed)
? ?[facility] ?[address] ?[place], [place] [address] ? ? ?CLINIC:  ?Medical Oncology/Hematology ? ?PCP:  ?Lemmie Evens, MD ?[address] [place] [address] ?[phone] ? ? 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 ?PRIOR THERAPY: Carboplatin, paclitaxel and Keytruda x 6 cycles from 05/03/2018 to 08/21/2018 ? ?NGS Results: Foundation 1 MS--stable ? ?CURRENT THERAPY: Keytruda every 3 weeks ? ?BRIEF ONCOLOGIC HISTORY:  ?Oncology History  ?Oropharyngeal carcinoma ([place])  ?07/27/2014 Imaging  ? CT neck- Advanced stage oropharyngeal cancer with necrotic adenopathy accounting for the left neck swelling. ?  ?07/28/2014 Initial Diagnosis  ? Oropharyngeal cancer ?  ?08/03/2014 Imaging  ? CT CAP- L supraclavicular lymphadenopathy is not completely visualized. This is better seen on the previous neck CT from 07/27/2014. Otherwise, no evidence for metastatic disease in the chest, abdomen, or pelvis. ?  ?08/03/2014 Imaging  ? Bone scan- Uptake at adjacent anterior LEFT 6, 7, 8 ribs likely representing trauma/fractures. Questionable nonspecific increased tracer localization at the posterior RIGHT 8th and 9th ribs, the adjacent nature which raises a a question of trauma as well ?  ?08/06/2014 Pathology Results  ? Dr. Benjamine Mola- Oropharynx, biopsy, Left - INVASIVE SQUAMOUS CELL CARCINOMA. ?  ?08/12/2014 Procedure  ? Dr. Enrique Sack- 1. Multiple extraction of tooth numbers 6, 17, 22, 23, 24, 25, 26, and 27. 3 Quadrants of alveoloplasty ?  ?08/17/2014 Pathology Results  ? PORT and G-TUBE placed by Dr. Carlis Stable. ?  ?08/26/2014 PET scan  ? Large hypermetabolic mass in the left base of tongue. Activity extends across midline to the right base tongue. 2. Intensely hypermetabolic left cervical metastatic lymph nodes. Lymph nodes extend from the left level II position to the left supraclavi ?  ?09/01/2014 - 09/22/2014 Chemotherapy  ? Concurrent chemoradiation with Cisplatin 100 mg/m2 x 2 cycles with Neulasta  support. Held cycle #3 d/t renal toxicity.  ?  ?09/03/2014 - 10/23/2014 Radiation Therapy  ? Treated in [place], IMRT Isidore Moos).  Base of tongue and bilat neck. Total dose: 70 Gy in 35 fractions. (of note, he did miss several treatments requiring BID dosing towards the end of treatment).  ?  ?01/25/2015 PET scan  ? Near complete resolution of metabolic activity at the base of tongue. Minimal residual activity is likely post treatment effect. 2. Complete resolution of metabolic activity above LEFT cervical lymph nodes. No evidence of residual metabolically active  ?  ?07/17/2016 Procedure  ? Port-a-cath removed [name])  ?  ?05/03/2018 - 08/23/2018 Chemotherapy  ? The patient had dexamethasone (DECADRON) 4 MG tablet, 8 mg, Oral, Daily, 1 of 1 cycle, Start date: 05/01/2018, End date: 10/23/2018 ?palonosetron (ALOXI) injection 0.25 mg, 0.25 mg, Intravenous,  Once, 6 of 6 cycles ?Administration: 0.25 mg (05/03/2018), 0.25 mg (05/24/2018), 0.25 mg (06/14/2018), 0.25 mg (07/09/2018), 0.25 mg (07/30/2018), 0.25 mg (08/21/2018) ?pegfilgrastim-cbqv (UDENYCA) injection 6 mg, 6 mg, Subcutaneous, Once, 5 of 5 cycles ?Administration: 6 mg (05/27/2018), 6 mg (06/17/2018), 6 mg (07/11/2018), 6 mg (08/01/2018), 6 mg (08/23/2018) ?CARBOplatin (PARAPLATIN) 380 mg in sodium chloride 0.9 % 250 mL chemo infusion, 380 mg (100 % of original dose 381 mg), Intravenous,  Once, 6 of 6 cycles ?Dose modification:   (original dose 381 mg, Cycle 1),   (original dose 309.5 mg, Cycle 2), 307.5 mg (original dose 309.5 mg, Cycle 5) ?Administration: 380 mg (05/03/2018), 310 mg (05/24/2018), 310 mg (06/14/2018), 340 mg (07/09/2018), 310 mg (07/30/2018), 350 mg (08/21/2018) ?PACLitaxel (TAXOL) 330 mg in sodium chloride 0.9 %  500 mL chemo infusion (> 80mg /m2), 175 mg/m2 = 330 mg (100 % of original dose 175 mg/m2), Intravenous,  Once, 6 of 6 cycles ?Dose modification: 175 mg/m2 (original dose 175 mg/m2, Cycle 1, Reason: Patient Age) ?Administration: 330 mg (05/03/2018), 330 mg (05/24/2018),  330 mg (06/14/2018), 330 mg (07/09/2018), 330 mg (07/30/2018), 330 mg (08/21/2018) ? ? for chemotherapy treatment.  ? ?  ?05/24/2018 -  Chemotherapy  ? Patient is on Treatment Plan : HEAD/NECK Pembrolizumab/Taxol/Carboplatin Q21D  ?   ?Squamous cell lung cancer, left ([facility])  ?06/14/2018 Initial Diagnosis  ? Squamous cell lung cancer, left ([place]) ?  ? ? ?CANCER STAGING: ? Cancer Staging  ?Oropharyngeal carcinoma ([place]) ?Staging form: Pharynx - Oropharynx, AJCC 7th Edition ?- Clinical: Stage IVA (T4a, N2b, M0) - Unsigned ? ? ?INTERVAL HISTORY:  ?Alexander [name], a 71 y.o. male, returns for routine follow-up and consideration for next cycle of chemotherapy. [name] was last seen on 05/04/2021. ? ?Due for cycle #50 of Keytruda today.  ? ?Overall, he tells me he has been feeling pretty well. He denies back pain, n/v/d, tingling/numbness, and back pain.  ? ?Overall, he feels ready for next cycle of chemo today.  ? ?REVIEW OF SYSTEMS:  ?Review of Systems  ?Constitutional:  Negative for appetite change and fatigue.  ?Gastrointestinal:  Negative for diarrhea, nausea and vomiting.  ?Musculoskeletal:  Negative for back pain. Arthralgias: 4/10 knees. ?All other systems reviewed and are negative. ? ?PAST MEDICAL/SURGICAL HISTORY:  ?Past Medical History:  ?Diagnosis Date  ? GERD (gastroesophageal reflux disease)   ? Mass of neck   ? 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 Oropharyngeal cancer ([facility]) 07/28/2014  ? dx. 3 weeks ago.- Dr. Oneal Deputy center [place], [place].  ? 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[place] [facility]) 08/06/2014  ? SCCa of Left BOT  ? ?Past Surgical History:  ?Procedure Laterality Date  ? BIOPSY  01/15/2018  ? Procedure: BIOPSY;  Surgeon: Danie Binder, MD;  [facility];  Service: Endoscopy;;  gastric  ? COLONOSCOPY N/A 03/13/2016  ? Procedure: COLONOSCOPY;  Surgeon: Danie Binder, MD;  [facility];  Service: Endoscopy;  Laterality: N/A;  2:15 PM  ?  ESOPHAGOGASTRODUODENOSCOPY (EGD) WITH PROPOFOL N/A 08/17/2014  ? Procedure: ESOPHAGOGASTRODUODENOSCOPY (EGD) WITH PROPOFOL (procedure #1);  Surgeon: Aviva Signs Md, MD;  Location[facility];  Service: General;  Laterality: N/A;  ? ESOPHAGOGASTRODUODENOSCOPY (EGD) WITH PROPOFOL N/A 01/15/2018  ? Procedure: ESOPHAGOGASTRODUODENOSCOPY (EGD) WITH PROPOFOL;  Surgeon: Danie Binder, MD;  [facility];  Service: Endoscopy;  Laterality: N/A;  9:30am  ? MULTIPLE EXTRACTIONS WITH ALVEOLOPLASTY N/A 08/12/2014  ? Procedure: Extraction of tooth #'s 6,17,22,23,24,25,26,27 with alveoloplasty;  Surgeon: Lenn Cal, DDS;  [facility];  Service: Oral Surgery;  Laterality: N/A;  ? PANENDOSCOPY N/A 08/06/2014  ? Procedure: PANENDOSCOPY WITH BIOPSY;  Surgeon: Leta Baptist, MD;  Location: [facility];  Service: ENT;  Laterality: N/A;  ? PEG PLACEMENT Left 08/17/14  ? PEG PLACEMENT N/A 08/17/2014  ? Procedure: PERCUTANEOUS ENDOSCOPIC GASTROSTOMY (PEG) PLACEMENT (procedure #1);  Surgeon: Aviva Signs Md, MD;  Location[facility];  Service: General;  Laterality: N/A;  ? PORT-A-CATH REMOVAL Right 07/17/2016  ? Procedure: MINOR REMOVAL PORT-A-CATH;  Surgeon: Aviva Signs, MD;  Location: [facility];  Service: General;  Laterality: Right;  ? PORTACATH PLACEMENT Right 08/17/14  ? PORTACATH PLACEMENT Right 08/17/2014  ? Procedure: INSERTION PORT-A-CATH (procedure #2);  Surgeon: Aviva Signs Md,  MD;  Location: [facility];  Service: General;  Laterality: Right;  ? PORTACATH PLACEMENT Left 04/26/2018  ? Procedure: INSERTION PORT-A-CATH (attached catheter in left subclavian);  Surgeon: Aviva Signs, MD;  Location: [facility];  Service: General;  Laterality: Left;  ? SAVORY DILATION N/A 01/15/2018  ? Procedure: SAVORY DILATION;  Surgeon: Danie Binder, MD;  [facility];  Service: Endoscopy;  Laterality: N/A;  ? VIDEO BRONCHOSCOPY WITH ENDOBRONCHIAL ULTRASOUND N/A 04/15/2018  ? Procedure: VIDEO BRONCHOSCOPY WITH ENDOBRONCHIAL  ULTRASOUND;  Surgeon: Melrose Nakayama, MD;  Location: [facility];  Service: Thoracic;  Laterality: N/A;  ? ? ?SOCIAL HISTORY:  ?Social History  ? ?Socioeconomic History  ? Marital status: Legally Separated  ?  Spouse nam

## 2021-06-08 NOTE — Patient Instructions (Signed)
[facility]  Discharge Instructions: ?Thank you for choosing [facility] to provide your oncology and hematology care.  ?If you have a lab appointment with the [facility], please come in thru the Main Entrance and check in at the main information desk. ? ?Wear comfortable clothing and clothing appropriate for easy access to any Portacath or PICC line.  ? ?We strive to give you quality time with your provider. You may need to reschedule your appointment if you arrive late (15 or more minutes).  Arriving late affects you and other patients whose appointments are after yours.  Also, if you miss three or more appointments without notifying the office, you may be dismissed from the clinic at the provider?s discretion.    ?  ?For prescription refill requests, have your pharmacy contact our office and allow 72 hours for refills to be completed.   ? ?Today you received the following chemotherapy and/or immunotherapy agents No treatment today    ?  ?To help prevent nausea and vomiting after your treatment, we encourage you to take your nausea medication as directed. ? ?BELOW ARE SYMPTOMS THAT SHOULD BE REPORTED IMMEDIATELY: ?*FEVER GREATER THAN 100.4 F (38 ?C) OR HIGHER ?*CHILLS OR SWEATING ?*NAUSEA AND VOMITING THAT IS NOT CONTROLLED WITH YOUR NAUSEA MEDICATION ?*UNUSUAL SHORTNESS OF BREATH ?*UNUSUAL BRUISING OR BLEEDING ?*URINARY PROBLEMS (pain or burning when urinating, or frequent urination) ?*BOWEL PROBLEMS (unusual diarrhea, constipation, pain near the anus) ?TENDERNESS IN MOUTH AND THROAT WITH OR WITHOUT PRESENCE OF ULCERS (sore throat, sores in mouth, or a toothache) ?UNUSUAL RASH, SWELLING OR PAIN  ?UNUSUAL VAGINAL DISCHARGE OR ITCHING  ? ?Items with * indicate a potential emergency and should be followed up as soon as possible or go to the Emergency Department if any problems should occur. ? ?Please show the CHEMOTHERAPY ALERT CARD or IMMUNOTHERAPY ALERT CARD at check-in to the  Emergency Department and triage nurse. ? ?Should you have questions after your visit or need to cancel or reschedule your appointment, please contact [facility] [phone]  and follow the prompts.  Office hours are 8:00 a.m. to 4:30 p.m. Monday - Friday. Please note that voicemails left after 4:00 p.m. may not be returned until the following business day.  We are closed weekends and major holidays. You have access to a nurse at all times for urgent questions. Please call the main number to the clinic [phone] and follow the prompts. ? ?For any non-urgent questions, you may also contact your provider using MyChart. We now offer e-Visits for anyone 51 and older to request care online for non-urgent symptoms. For details visit [email or web address]. ?  ?Also download the MyChart app! Go to the app store, search "MyChart", open the app, select [facility], and log in with your MyChart username and password. ? ?Due to Covid, a mask is required upon entering the hospital/clinic. If you do not have a mask, one will be given to you upon arrival. For doctor visits, patients may have 1 support person aged 50 or older with them. For treatment visits, patients cannot have anyone with them due to current Covid guidelines and our immunocompromised population.  ?

## 2021-06-08 NOTE — Patient Instructions (Signed)
[facility] at [facility] ?Discharge Instructions ? ? ?You were seen and examined today by Dr. Delton Coombes. ? ?He reviewed the results of your CT scan.  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  We will send a special test on your biopsy to see what treatment options are available.  We will see you back in 3 weeks after those results to discuss new treatment options.  ? ?Return as scheduled.  ? ? ?Thank you for choosing [place] at [facility] to provide your oncology and hematology care.  To afford each patient quality time with our provider, please arrive at least 15 minutes before your scheduled appointment time.  ? ?If you have a lab appointment with the [facility] please come in thru the Main Entrance and check in at the main information desk. ? ?You need to re-schedule your appointment should you arrive 10 or more minutes late.  We strive to give you quality time with our providers, and arriving late affects you and other patients whose appointments are after yours.  Also, if you no show three or more times for appointments you may be dismissed from the clinic at the providers discretion.     ?Again, thank you for choosing [facility].  Our hope is that these requests will decrease the amount of time that you wait before being seen by our physicians.       ?_____________________________________________________________ ? ?Should you have questions after your visit to [facility], please contact our office at [phone] and follow the prompts.  Our office hours are 8:00 a.m. and 4:30 p.m. Monday - Friday.  Please note that voicemails left after 4:00 p.m. may not be returned until the following business day.  We are closed weekends and major holidays.  You do have access to a nurse 24-7, just call the main number to the clinic [phone] and do not press any options, hold on the line and a nurse will answer the  phone.   ? ?For prescription refill requests, have your pharmacy contact our office and allow 72 hours.   ? ?Due to Covid, you will need to wear a mask upon entering the hospital. If you do not have a mask, a mask will be given to you at the Main Entrance upon arrival. For doctor visits, patients may have 1 support person age 32 or older with them. For treatment visits, patients can not have anyone with them due to social distancing guidelines and our immunocompromised population.  ? ?   ?

## 2021-06-08 NOTE — Progress Notes (Signed)
Message from Anastasio Champion RN/Dr. Delton Coombes, no treatment today due to progression. ?

## 2021-06-09 ENCOUNTER — Encounter (HOSPITAL_COMMUNITY): Payer: Self-pay

## 2021-06-09 NOTE — Progress Notes (Signed)
[employer] sent on [phone] per Dr. Delton Coombes ?

## 2021-06-13 ENCOUNTER — Other Ambulatory Visit: Payer: Self-pay

## 2021-06-13 ENCOUNTER — Encounter: Payer: Self-pay | Admitting: Urology

## 2021-06-13 ENCOUNTER — Ambulatory Visit (INDEPENDENT_AMBULATORY_CARE_PROVIDER_SITE_OTHER): Payer: Medicare Other | Admitting: Urology

## 2021-06-13 VITALS — BP 131/75 | HR 73

## 2021-06-13 DIAGNOSIS — R339 Retention of urine, unspecified: Secondary | ICD-10-CM | POA: Diagnosis not present

## 2021-06-13 DIAGNOSIS — N401 Enlarged prostate with lower urinary tract symptoms: Secondary | ICD-10-CM

## 2021-06-13 DIAGNOSIS — N138 Other obstructive and reflux uropathy: Secondary | ICD-10-CM

## 2021-06-13 MED ORDER — CIPROFLOXACIN HCL 500 MG PO TABS
500.0000 mg | ORAL_TABLET | Freq: Once | ORAL | Status: AC
  Administered 2021-06-13: 500 mg via ORAL

## 2021-06-13 NOTE — H&P (View-Only) (Signed)
? ?  06/13/21 ? ?CC: urinary retention ? ?HPI: ?[name] is a 71yo here for followup for urinary retention. He has failed multiple voiding trials and is currently on rapaflo 8mg  BID ?Blood pressure 131/75, pulse 73. ?NED. A&Ox3.   ?No respiratory distress   ?Abd soft, NT, ND ?Normal phallus with bilateral descended testicles ? ?Cystoscopy Procedure Note ? ?Patient identification was confirmed, informed consent was obtained, and patient was prepped using Betadine solution.  Lidocaine jelly was administered per urethral meatus.   ? ? ?Pre-Procedure: ?- Inspection reveals a normal caliber ureteral meatus. ? ?Procedure: ?The flexible cystoscope was introduced without difficulty ?- No urethral strictures/lesions are present. ?- Enlarged prostate no median lobe ?- Normal bladder neck ?- Bilateral ureteral orifices identified ?- Bladder mucosa  reveals no ulcers, tumors, or lesions ?- No bladder stones ?- No trabeculation ? ?Retroflexion shows no intravesical prostatic protrusion ? ? ?Post-Procedure: ?- Patient tolerated the procedure well ? ?Assessment/ Plan: ?We discussed the management of his BPH including continued medical therapy, Rezum, Urolift, TURP and simple prostatectomy. After discussing the options the patient has elected to proceed with Urolift. Risks/benefits/alternatives discussed. ? ? ?No follow-ups on file. ? ?[name], MD  ?

## 2021-06-13 NOTE — Progress Notes (Signed)
I spoke with Alexander [name]. We have discussed possible surgery dates and 06/27/2021 was agreed upon by all parties. Patient given information about surgery date, what to expect pre-operatively and post operatively.  ?  ?We discussed that a pre-op nurse will be calling to set up the pre-op visit that will take place prior to surgery. Informed patient that our office will communicate any additional care to be provided after surgery.  ?  ?Patients questions or concerns were discussed during our call. Advised to call our office should there be any additional information, questions or concerns that arise. Patient verbalized understanding.   ?

## 2021-06-13 NOTE — Progress Notes (Signed)
? ?  06/13/21 ? ?CC: urinary retention ? ?HPI: ?Alexander [name] is a 71yo here for followup for urinary retention. He has failed multiple voiding trials and is currently on rapaflo 8mg  BID ?Blood pressure 131/75, pulse 73. ?NED. A&Ox3.   ?No respiratory distress   ?Abd soft, NT, ND ?Normal phallus with bilateral descended testicles ? ?Cystoscopy Procedure Note ? ?Patient identification was confirmed, informed consent was obtained, and patient was prepped using Betadine solution.  Lidocaine jelly was administered per urethral meatus.   ? ? ?Pre-Procedure: ?- Inspection reveals a normal caliber ureteral meatus. ? ?Procedure: ?The flexible cystoscope was introduced without difficulty ?- No urethral strictures/lesions are present. ?- Enlarged prostate no median lobe ?- Normal bladder neck ?- Bilateral ureteral orifices identified ?- Bladder mucosa  reveals no ulcers, tumors, or lesions ?- No bladder stones ?- No trabeculation ? ?Retroflexion shows no intravesical prostatic protrusion ? ? ?Post-Procedure: ?- Patient tolerated the procedure well ? ?Assessment/ Plan: ?We discussed the management of his BPH including continued medical therapy, Rezum, Urolift, TURP and simple prostatectomy. After discussing the options the patient has elected to proceed with Urolift. Risks/benefits/alternatives discussed. ? ? ?No follow-ups on file. ? ?[name], MD  ?

## 2021-06-13 NOTE — Patient Instructions (Addendum)
Dear Mr. Swingler, ?  ?Thank you for choosing [facility] to assist in your urologic care for your upcoming surgery. The following information below includes specific dates and details related to surgery: ?  ?The Surgical Procedure you are scheduled to have performed is cystoscopy with urolift ?  ?Surgery Date: 06/27/2021 ?  ?Physician performing the surgery: Dr. Alyson Ingles ?  ?Do not eat or drink after midnight the day before your surgery.  ?  ?You will need a driver the day of surgery and will not be able to operate heavy machinery for 24 hours after.  ?  ?Your surgery will be performed at  ?[address] ?[place], [place] [address] ?  ?Enter at the [employer] and check in at the [place] desk.   ?  ?Pre-Admit Testing Info ?  ?Pre- Admit appointments are interview with an anesthesiologist or a pre-operative anesthesia nurse. These appointments are typically completed as an in person visit but can take place over the telephone.  You will be contacted to confirm the date and time window.  ?  ?If you have any questions or concerns, please don't hesitate to call the office at [phone] ?  ?Thank you,  ?  ?Gibson Ramp, RN ?Clinical Surgery Coordinator ?[facility]  ? ? ? ? ?Prostatic Urethral Lift ?Prostatic urethral lift is a surgical procedure to treat symptoms of prostate gland enlargement that occurs with age (benign prostatic hypertrophy, BPH). ?The urethra passes between the two lobes of the prostate. The urethra is the part of the body that drains urine from the bladder. As the prostate enlarges, it can push on the urethra and cause problems with urinating. This procedure involves placing an implant that holds the prostate away from the urethra. The procedure is done using a thin device called a cystoscope. The device is inserted through the tip of the penis and moved up the urethra to the prostate. This is less invasive than other procedures that  require an incision. ?You may have this procedure if: ?You have symptoms of BPH. ?Your prostate is not severely enlarged. ?Medicines to treat BPH are not working or not tolerated. ?You want to avoid possible sexual side effects from medicines or other procedures that are used to treat BPH. ?Tell a health care provider about: ?Any allergies you have. ?All medicines you are taking, including vitamins, herbs, eye drops, creams, and over-the-counter medicines. ?Any problems you or family members have had with anesthetic medicines. ?Any bleeding problems you have. ?Any surgeries you have had. ?Any medical conditions you have. ?What are the risks? ?Generally, this is a safe procedure. However, problems may occur, including: ?Bleeding. ?Infection. ?Leaking of urine (incontinence). ?Allergic reactions to medicines. ?Return of BPH symptoms after 2 years, requiring more treatment. ?What happens before the procedure? ?When to stop eating and drinking ?Follow instructions from your health care provider about what you may eat and drink before your procedure. These may include: ?8 hours before your procedure ?Stop eating most foods. Do not eat meat, fried foods, or fatty foods. ?Eat only light foods, such as toast or crackers. ?All liquids are okay except energy drinks and alcohol. ?6 hours before your procedure ?Stop eating. ?Drink only clear liquids, such as water, clear fruit juice, black coffee, plain tea, and sports drinks. ?Do not drink energy drinks or alcohol. ?2 hours before your procedure ?Stop drinking all liquids. ?You may be allowed to take medicines with small sips of water. ?  If you do not follow your health care provider's instructions, your procedure may be delayed or canceled. ?Medicines ?Ask your health care provider about: ?Changing or stopping your regular medicines. This is especially important if you are taking diabetes medicines or blood thinners. ?Taking medicines such as aspirin and ibuprofen. These  medicines can thin your blood. Do not take these medicines unless your health care provider tells you to take them. ?Taking over-the-counter medicines, vitamins, herbs, and supplements. ?Surgery safety ?Ask your health care provider what steps will be taken to help prevent infection. These steps may include: ?Removing hair at the surgery site. ?Washing skin with a germ-killing soap. ?Taking antibiotic medicine. ?General instructions ?Do not use any products that contain nicotine or tobacco for at least 4 weeks before the procedure. These products include cigarettes, chewing tobacco, and vaping devices, such as e-cigarettes. If you need help quitting, ask your health care provider. ?If you will be going home right after the procedure, plan to have a responsible adult: ?Take you home from the hospital or clinic. You will not be allowed to drive. ?Care for you for the time you are told. ?What happens during the procedure? ?An IV may be inserted into one of your veins. ?You will be given one or more of the following: ?A medicine to help you relax (sedative). ?A medicine that is injected into your urethra to numb the area (local anesthetic). ?A medicine to make you fall asleep (general anesthetic). ?A cystoscope will be inserted into your penis and moved through your urethra to your prostate. ?A device will be inserted through the cystoscope and used to press the lobes of your prostate away from your urethra. ?Implants will be inserted through the device to hold the lobes of your prostate in the widened position. ?The device and cystoscope will be removed. ?The procedure may vary among health care providers and hospitals. ?What happens after the procedure? ?Your blood pressure, heart rate, breathing rate, and blood oxygen level be monitored until you leave the hospital or clinic. ?If you were given a sedative during the procedure, it can affect you for several hours. Do not drive or operate machinery until your health  care provider says that it is safe. ?Summary ?Prostatic urethral lift is a surgical procedure to relieve symptoms of prostate gland enlargement that occurs with age (benign prostatic hypertrophy, BPH). ?The procedure is performed with a thin device called a cystoscope. This device is inserted through the tip of the penis and moved up the urethra to reach the prostate. This is less invasive than other procedures that require an incision. ?If you will be going home right after the procedure, plan to have a responsible adult take you home from the hospital or clinic. You will not be allowed to drive. ?This information is not intended to replace advice given to you by your health care provider. Make sure you discuss any questions you have with your health care provider. ?Document Revised: 10/08/2020 Document Reviewed: 10/08/2020 ?Elsevier Patient Education ? [place]. ? ?

## 2021-06-21 ENCOUNTER — Ambulatory Visit: Payer: Medicare Other | Admitting: Urology

## 2021-06-22 NOTE — Patient Instructions (Signed)
? ? ? ? ? ? ? ? Alexander [name] ? 06/22/2021  ?  ? @PREFPERIOPPHARMACY @ ? ? Your procedure is scheduled on  06/27/2021. ? ? Report to [facility] at  1200  P.M. ? ? Call this number if you have problems the morning of surgery: ? [phone] ? ? Remember: ? Do not eat or drink after midnight. ?  ?  ? Take these medicines the morning of surgery with A SIP OF WATER  ? ?hydrocodone(If needed), levothyroxine, rapaflo. ? ?  ? Do not wear jewelry, make-up or nail polish. ? Do not wear lotions, powders, or perfumes, or deodorant. ? Do not shave 48 hours prior to surgery.  Men may shave face and neck. ? Do not bring valuables to the hospital. ? [facility] is not responsible for any belongings or valuables. ? ?Contacts, dentures or bridgework may not be worn into surgery.  Leave your suitcase in the car.  After surgery it may be brought to your room. ? ?For patients admitted to the hospital, discharge time will be determined by your treatment team. ? ?Patients discharged the day of surgery will not be allowed to drive home and must have someone with them for 24 hours.  ? ? ?Special instructions:   DO NOT smoke tobacco or vape for 24 hours before your procedure. ? ?Please read over the following fact sheets that you were given. ?Coughing and Deep Breathing, Surgical Site Infection Prevention, Anesthesia Post-op Instructions, and Care and Recovery After Surgery ?  ? ? ? Prostatic Urethral Lift, Care After ?The following information offers guidance on how to care for yourself after your procedure. Your health care provider may also give you more specific instructions. If you have problems or questions, contact your health care provider. ?What can I expect after the procedure? ?After the procedure, it is common to have: ?Soreness or discomfort in your penis from having the cystoscope inserted during the procedure. ?Discomfort or burning when urinating. ?An increased urge to urinate. ?More frequent urination. ?Urine that is  blood-tinged. ?These symptoms should go away after a few days. ?Follow these instructions at home: ?Activity ? ?If you were given a sedative during the procedure, it can affect you for several hours. Do not drive or operate machinery until your health care provider says that it is safe. ?Avoid sitting for a long time without moving. Get up to take short walks every 1-2 hours. This is important to improve blood flow and breathing. Ask for help if you feel weak or unsteady. ?You may have to avoid lifting. Ask your health care provider how much you can safely lift. ?Avoid intense physical activity for as long as told by your health care provider. ?Return to your normal activities as told by your health care provider. Ask your health care provider what activities are safe for you. Ask when you can return to sexual activity. ?General instructions ? ?Take over-the-counter and prescription medicines only as told by your health care provider. ?Ask your health care provider if the medicine prescribed to you: ?Requires you to avoid driving or using machinery. ?Can cause constipation. You may need to take these actions to prevent or treat constipation: ?Drink enough fluid to keep your urine pale yellow. ?Take over-the-counter or prescription medicines. ?Eat foods that are high in fiber, such as beans, whole grains, and fresh fruits and vegetables. ?Limit foods that are high in fat and processed sugars, such as fried or sweet foods. ?Do not use any products that  contain nicotine or tobacco. These products include cigarettes, chewing tobacco, and vaping devices, such as e-cigarettes. These can delay healing after the procedure. If you need help quitting, ask your health care provider. ?Keep all follow-up visits. This is important. ?Contact a health care provider if: ?You have chills or a fever. ?You have pain when passing urine. ?You have bright red blood or blood clots in your urine. ?You have difficulty passing urine. ?You  have leaking of urine (incontinence). ?Get help right away if: ?You have chest pain or shortness of breath. ?You have leg pain or swelling. ?You cannot pass urine. ?These symptoms may be an emergency. Get help right away. Call 911. ?Do not wait to see if the symptoms will go away. ?Do not drive yourself to the hospital. ?Summary ?After the procedure, it is common to have discomfort or burning when urinating, an increased urge to urinate, more frequent urination, and urine that is blood-tinged. ?You may have to avoid lifting. Ask your health care provider how much you can safely lift. ?Return to your normal activities as told by your health care provider. Ask when you can return to sexual activity. ?This information is not intended to replace advice given to you by your health care provider. Make sure you discuss any questions you have with your health care provider. ?Document Revised: 10/08/2020 Document Reviewed: 10/08/2020 ?Elsevier Patient Education ? [place]. ?General Anesthesia, Adult, Care After ?This sheet gives you information about how to care for yourself after your procedure. Your health care provider may also give you more specific instructions. If you have problems or questions, contact your health care provider. ?What can I expect after the procedure? ?After the procedure, the following side effects are common: ?Pain or discomfort at the IV site. ?Nausea. ?Vomiting. ?Sore throat. ?Trouble concentrating. ?Feeling cold or chills. ?Feeling weak or tired. ?Sleepiness and fatigue. ?Soreness and body aches. These side effects can affect parts of the body that were not involved in surgery. ?Follow these instructions at home: ?For the time period you were told by your health care provider: ? ?Rest. ?Do not participate in activities where you could fall or become injured. ?Do not drive or use machinery. ?Do not drink alcohol. ?Do not take sleeping pills or medicines that cause drowsiness. ?Do not make  important decisions or sign legal documents. ?Do not take care of children on your own. ?Eating and drinking ?Follow any instructions from your health care provider about eating or drinking restrictions. ?When you feel hungry, start by eating small amounts of foods that are soft and easy to digest (bland), such as toast. Gradually return to your regular diet. ?Drink enough fluid to keep your urine pale yellow. ?If you vomit, rehydrate by drinking water, juice, or clear broth. ?General instructions ?If you have sleep apnea, surgery and certain medicines can increase your risk for breathing problems. Follow instructions from your health care provider about wearing your sleep device: ?Anytime you are sleeping, including during daytime naps. ?While taking prescription pain medicines, sleeping medicines, or medicines that make you drowsy. ?Have a responsible adult stay with you for the time you are told. It is important to have someone help care for you until you are awake and alert. ?Return to your normal activities as told by your health care provider. Ask your health care provider what activities are safe for you. ?Take over-the-counter and prescription medicines only as told by your health care provider. ?If you smoke, do not smoke without supervision. ?Keep all  follow-up visits as told by your health care provider. This is important. ?Contact a health care provider if: ?You have nausea or vomiting that does not get better with medicine. ?You cannot eat or drink without vomiting. ?You have pain that does not get better with medicine. ?You are unable to pass urine. ?You develop a skin rash. ?You have a fever. ?You have redness around your IV site that gets worse. ?Get help right away if: ?You have difficulty breathing. ?You have chest pain. ?You have blood in your urine or stool, or you vomit blood. ?Summary ?After the procedure, it is common to have a sore throat or nausea. It is also common to feel tired. ?Have a  responsible adult stay with you for the time you are told. It is important to have someone help care for you until you are awake and alert. ?When you feel hungry, start by eating small amounts of foods that are

## 2021-06-23 ENCOUNTER — Encounter (HOSPITAL_COMMUNITY)
Admission: RE | Admit: 2021-06-23 | Discharge: 2021-06-23 | Disposition: A | Discharge status: Home / Self Care | Payer: Medicare Other | Source: Ambulatory Visit | Attending: Urology | Admitting: Urology

## 2021-06-23 ENCOUNTER — Other Ambulatory Visit: Payer: Self-pay

## 2021-06-23 ENCOUNTER — Encounter (HOSPITAL_COMMUNITY): Payer: Self-pay

## 2021-06-23 NOTE — Pre-Procedure Instructions (Signed)
Patient had [name] call for him, but she could not tell us any medical history. She will have Alexander [name] call us back. ?

## 2021-06-27 ENCOUNTER — Ambulatory Visit (HOSPITAL_COMMUNITY)
Admission: RE | Admit: 2021-06-27 | Discharge: 2021-06-27 | Disposition: A | Discharge status: Home / Self Care | Payer: Medicare Other | Source: Ambulatory Visit | Attending: Urology | Admitting: Urology

## 2021-06-27 ENCOUNTER — Ambulatory Visit (HOSPITAL_BASED_OUTPATIENT_CLINIC_OR_DEPARTMENT_OTHER): Payer: Medicare Other | Admitting: Anesthesiology

## 2021-06-27 ENCOUNTER — Encounter (HOSPITAL_COMMUNITY): Payer: Self-pay | Admitting: Urology

## 2021-06-27 ENCOUNTER — Encounter (HOSPITAL_COMMUNITY)
Admission: RE | Disposition: A | Discharge status: Home / Self Care | Payer: Self-pay | Source: Ambulatory Visit | Attending: Urology

## 2021-06-27 ENCOUNTER — Ambulatory Visit (HOSPITAL_COMMUNITY): Payer: Medicare Other | Admitting: Anesthesiology

## 2021-06-27 DIAGNOSIS — C109 Malignant neoplasm of oropharynx, unspecified: Secondary | ICD-10-CM | POA: Diagnosis not present

## 2021-06-27 DIAGNOSIS — N138 Other obstructive and reflux uropathy: Secondary | ICD-10-CM | POA: Insufficient documentation

## 2021-06-27 DIAGNOSIS — K219 Gastro-esophageal reflux disease without esophagitis: Secondary | ICD-10-CM | POA: Insufficient documentation

## 2021-06-27 DIAGNOSIS — Z87891 Personal history of nicotine dependence: Secondary | ICD-10-CM | POA: Insufficient documentation

## 2021-06-27 DIAGNOSIS — N401 Enlarged prostate with lower urinary tract symptoms: Secondary | ICD-10-CM | POA: Insufficient documentation

## 2021-06-27 DIAGNOSIS — Z79899 Other long term (current) drug therapy: Secondary | ICD-10-CM | POA: Insufficient documentation

## 2021-06-27 DIAGNOSIS — N320 Bladder-neck obstruction: Secondary | ICD-10-CM | POA: Diagnosis not present

## 2021-06-27 DIAGNOSIS — R338 Other retention of urine: Secondary | ICD-10-CM | POA: Diagnosis present

## 2021-06-27 HISTORY — PX: CYSTOSCOPY WITH INSERTION OF UROLIFT: SHX6678

## 2021-06-27 SURGERY — CYSTOSCOPY WITH INSERTION OF UROLIFT
Anesthesia: General

## 2021-06-27 MED ORDER — CHLORHEXIDINE GLUCONATE 0.12 % MT SOLN
15.0000 mL | Freq: Once | OROMUCOSAL | Status: AC
  Administered 2021-06-27: 15 mL via OROMUCOSAL

## 2021-06-27 MED ORDER — LIDOCAINE HCL (PF) 2 % IJ SOLN
INTRAMUSCULAR | Status: AC
  Filled 2021-06-27: qty 15

## 2021-06-27 MED ORDER — DEXAMETHASONE SODIUM PHOSPHATE 10 MG/ML IJ SOLN
INTRAMUSCULAR | Status: DC | PRN
  Administered 2021-06-27: 10 mg via INTRAVENOUS

## 2021-06-27 MED ORDER — FENTANYL CITRATE PF 50 MCG/ML IJ SOSY
PREFILLED_SYRINGE | INTRAMUSCULAR | Status: AC
  Filled 2021-06-27: qty 1

## 2021-06-27 MED ORDER — FENTANYL CITRATE PF 50 MCG/ML IJ SOSY
25.0000 ug | PREFILLED_SYRINGE | INTRAMUSCULAR | Status: DC | PRN
  Administered 2021-06-27 (×2): 50 ug via INTRAVENOUS

## 2021-06-27 MED ORDER — ORAL CARE MOUTH RINSE: 15.0000 mL | Freq: Once | OROMUCOSAL | Status: AC

## 2021-06-27 MED ORDER — CEFAZOLIN SODIUM-DEXTROSE 2-4 GM/100ML-% IV SOLN
INTRAVENOUS | Status: AC
  Filled 2021-06-27: qty 100

## 2021-06-27 MED ORDER — PROPOFOL 10 MG/ML IV BOLUS
INTRAVENOUS | Status: DC | PRN
  Administered 2021-06-27: 130 mg via INTRAVENOUS

## 2021-06-27 MED ORDER — HYDROCODONE-ACETAMINOPHEN 10-325 MG PO TABS: 1.0000 | ORAL_TABLET | Freq: Two times a day (BID) | ORAL | 0 refills | Status: DC | PRN

## 2021-06-27 MED ORDER — STERILE WATER FOR IRRIGATION IR SOLN
Status: DC | PRN
  Administered 2021-06-27: 500 mL

## 2021-06-27 MED ORDER — ONDANSETRON HCL 4 MG/2ML IJ SOLN
INTRAMUSCULAR | Status: DC | PRN
  Administered 2021-06-27: 4 mg via INTRAVENOUS

## 2021-06-27 MED ORDER — FENTANYL CITRATE (PF) 100 MCG/2ML IJ SOLN
INTRAMUSCULAR | Status: AC
  Filled 2021-06-27: qty 2

## 2021-06-27 MED ORDER — DEXAMETHASONE SODIUM PHOSPHATE 10 MG/ML IJ SOLN
INTRAMUSCULAR | Status: AC
Start: 2021-06-27 — End: ?
  Filled 2021-06-27: qty 1

## 2021-06-27 MED ORDER — ONDANSETRON HCL 4 MG/2ML IJ SOLN
INTRAMUSCULAR | Status: AC
  Filled 2021-06-27: qty 4

## 2021-06-27 MED ORDER — LACTATED RINGERS IV SOLN: INTRAVENOUS | Status: DC

## 2021-06-27 MED ORDER — WATER FOR IRRIGATION, STERILE IR SOLN
Status: DC | PRN
  Administered 2021-06-27: 3000 mL

## 2021-06-27 MED ORDER — CEFAZOLIN SODIUM-DEXTROSE 2-4 GM/100ML-% IV SOLN
2.0000 g | INTRAVENOUS | Status: AC
  Administered 2021-06-27: 2 g via INTRAVENOUS

## 2021-06-27 MED ORDER — LIDOCAINE 2% (20 MG/ML) 5 ML SYRINGE
INTRAMUSCULAR | Status: DC | PRN
  Administered 2021-06-27: 100 mg via INTRAVENOUS

## 2021-06-27 MED ORDER — ONDANSETRON HCL 4 MG/2ML IJ SOLN: 4.0000 mg | Freq: Once | INTRAMUSCULAR | Status: DC | PRN

## 2021-06-27 MED ORDER — MIDAZOLAM HCL 2 MG/2ML IJ SOLN
INTRAMUSCULAR | Status: AC
  Filled 2021-06-27: qty 2

## 2021-06-27 MED ORDER — EPHEDRINE SULFATE-NACL 50-0.9 MG/10ML-% IV SOSY
PREFILLED_SYRINGE | INTRAVENOUS | Status: DC | PRN
Start: 2021-06-27 — End: 2021-06-27
  Administered 2021-06-27 (×2): 10 mg via INTRAVENOUS

## 2021-06-27 SURGICAL SUPPLY — 18 items
BAG DRAIN URO TABLE W/ADPT NS (BAG) ×2 IMPLANT
BAG DRN 8 ADPR NS SKTRN CSTL (BAG) ×1
BAG HAMPER (MISCELLANEOUS) ×2 IMPLANT
CLOTH BEACON ORANGE TIMEOUT ST (SAFETY) ×2 IMPLANT
GLOVE SURG POLYISO LF SZ8 (GLOVE) ×2 IMPLANT
GLOVE SURG UNDER POLY LF SZ7 (GLOVE) ×4 IMPLANT
GOWN STRL REUS W/TWL LRG LVL3 (GOWN DISPOSABLE) ×2 IMPLANT
GOWN STRL REUS W/TWL XL LVL3 (GOWN DISPOSABLE) ×2 IMPLANT
KIT TURNOVER CYSTO (KITS) ×2 IMPLANT
MANIFOLD NEPTUNE II (INSTRUMENTS) ×2 IMPLANT
PACK CYSTO (CUSTOM PROCEDURE TRAY) ×2 IMPLANT
PAD ARMBOARD 7.5X6 YLW CONV (MISCELLANEOUS) ×2 IMPLANT
SYSTEM UROLIFT (Male Continence) ×6 IMPLANT
TOWEL OR 17X26 4PK STRL BLUE (TOWEL DISPOSABLE) ×2 IMPLANT
TRAY FOLEY W/BAG SLVR 16FR (SET/KITS/TRAYS/PACK) ×2
TRAY FOLEY W/BAG SLVR 16FR ST (SET/KITS/TRAYS/PACK) ×1 IMPLANT
WATER STERILE IRR 3000ML UROMA (IV SOLUTION) ×2 IMPLANT
WATER STERILE IRR 500ML POUR (IV SOLUTION) ×2 IMPLANT

## 2021-06-27 NOTE — Anesthesia Preprocedure Evaluation (Signed)
Anesthesia Evaluation  ?Patient identified by MRN, date of birth, ID band ?Patient awake ? ? ? ?Reviewed: ?Allergy & Precautions, NPO status , Patient's Chart, lab work & pertinent test results ? ?Airway ?Mallampati: I ? ?TM Distance: >3 FB ?Neck ROM: Full ? ? ?Comment: oropharyngeal cancer Dental ? ?(+) Edentulous Upper, Edentulous Lower ?  ?Pulmonary ?former smoker,  ?Lung cancer ?  ?Pulmonary exam normal ?breath sounds clear to auscultation ? ? ? ? ? ? Cardiovascular ?negative cardio ROS ?Normal cardiovascular exam ?Rhythm:Regular Rate:Normal ? ? ?  ?Neuro/Psych ?negative neurological ROS ? negative psych ROS  ? GI/Hepatic ?Neg liver ROS, GERD  Medicated,  ?Endo/Other  ?negative endocrine ROS ? Renal/GU ?negative Renal ROS  ?negative genitourinary ?  ?Musculoskeletal ?negative musculoskeletal ROS ?(+)  ? Abdominal ?  ?Peds ?negative pediatric ROS ?(+)  Hematology ?negative hematology ROS ?(+)   ?Anesthesia Other Findings ?oropharyngeal cancer ? Reproductive/Obstetrics ?negative OB ROS ? ?  ? ? ? ? ? ? ? ? ? ? ? ? ? ?  ?  ? ? ? ? ? ? ?Anesthesia Physical ?Anesthesia Plan ? ?ASA: 3 ? ?Anesthesia Plan: General  ? ?Post-op Pain Management:   ? ?Induction: Intravenous ? ?PONV Risk Score and Plan: 3 and Ondansetron and Dexamethasone ? ?Airway Management Planned: LMA ? ?Additional Equipment:  ? ?Intra-op Plan:  ? ?Post-operative Plan:  ? ?Informed Consent: I have reviewed the patients History and Physical, chart, labs and discussed the procedure including the risks, benefits and alternatives for the proposed anesthesia with the patient or authorized representative who has indicated his/her understanding and acceptance.  ? ? ? ?Dental advisory given ? ?Plan Discussed with: CRNA and Surgeon ? ?Anesthesia Plan Comments:   ? ? ? ? ? ?Anesthesia Quick Evaluation ? ?

## 2021-06-27 NOTE — Op Note (Signed)
? ?  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 ? 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 ? ?PROCEDURE:  Cystoscopy with implantation of UroLift devices, 6 implants. ? ?SURGEON:  [name], M.D. ? ?ANESTHESIA:  General ? ?ANTIBIOTICS: ancef ? ?SPECIMEN:  None. ? ?DRAINS:  A 16-French Foley catheter. ? ?BLOOD LOSS:  Minimal. ? ?COMPLICATIONS:  None. ? ?INDICATIONS: The Patient is an 71 year old male with BPH and ?bladder outlet obstruction.  He has failed medical therapy and has ?elected UroLift for definitive treatment. ? ?FINDINGS OF PROCEDURE:  He was taken to the operating room where a ?genral anesthetic was induced.  He was placed in ?lithotomy position and was fitted with PAS hose.  His perineum and ?genitalia were prepped with chlorhexidine, and he was draped in usual ?sterile fashion. ? ?Cystoscopy was performed using the UroLift scope and 0 degree lens. ?Examination revealed a normal urethra.  The external sphincter was ?intact.  Prostatic urethra was approximately 4 cm in length with lateral ?lobe enlargement. There was also little bit of bladder neck elevation. ?Inspection of bladder revealed mild-to-moderate trabeculation with no ?tumors, stones, or inflammation.  No cellules or diverticula were noted. ?Ureteral orifices were in their normal anatomic position effluxing clear ?urine. ? ?After initial cystoscopy, the visual obturator was replaced with the ?first UroLift device.  This was turned to the 9 o'clock position and ?pulled back to the veru and then slightly advanced.  Pressure was ?then applied to the right lateral lobe and the UroLift device was ?deployed. ? ?The second UroLift device was then inserted and applied to the left ?lateral lobe at 3 o'clock and deployed in the mid prostatic urethra. ?After this, there was still some apparent obstruction closer to the ?bladder neck.  So a second level of  UroLift your left device was applied ?between the mid urethra and the proximal urethra providing further ?patency to the prostatic urethra.  At this point, a Foley catheter was indicated.  The scope was removed and ?a 16-French Foley catheter was inserted without difficulty. The balloon ?was filled with 10 mL sterile fluid, and the catheter was placed to ?straight drainage. ? ?COMPLICATIONS: None  ? ?CONDITION: Stable, extubated, transferred to PACU ? ?PLAN: The patient will be discharged home and followup in 2 days for a voiding trial.  ? ?

## 2021-06-27 NOTE — Transfer of Care (Signed)
Immediate Anesthesia Transfer of Care Note ? ?Patient: Alexander [name] ? ?Procedure(s) Performed: CYSTOSCOPY WITH INSERTION OF UROLIFT ? ?Patient Location: PACU ? ?Anesthesia Type:General ? ?Level of Consciousness: sedated and patient cooperative ? ?Airway & Oxygen Therapy: Patient Spontanous Breathing and Patient connected to nasal cannula oxygen ? ?Post-op Assessment: Report given to RN, Post -op Vital signs reviewed and stable and Patient moving all extremities ? ?Post vital signs: Reviewed and stable ? ?Last Vitals:  ?Vitals Value Taken Time  ?BP 132/71 06/27/21 1424  ?Temp    ?Pulse 72 06/27/21 1426  ?Resp 21 06/27/21 1426  ?SpO2 99 % 06/27/21 1426  ?Vitals shown include unvalidated device data. ? ?Last Pain:  ?Vitals:  ? 06/27/21 1230  ?PainSc: 0-No pain  ?   ? ?  ? ?Complications: No notable events documented. ?

## 2021-06-27 NOTE — Interval H&P Note (Signed)
History and Physical Interval Note: ? ?06/27/2021 ?1:24 PM ? ?RICKIE GANGE  has presented today for surgery, with the diagnosis of Benign prostatic hypertrophy with urinary retention.  The various methods of treatment have been discussed with the patient and family. After consideration of risks, benefits and other options for treatment, the patient has consented to  Procedure(s): ?CYSTOSCOPY WITH INSERTION OF UROLIFT (N/A) as a surgical intervention.  The patient's history has been reviewed, patient examined, no change in status, stable for surgery.  I have reviewed the patient's chart and labs.  Questions were answered to the patient's satisfaction.   ? ? ?[name] ? ? ?

## 2021-06-27 NOTE — Anesthesia Postprocedure Evaluation (Signed)
Anesthesia Post Note ? ?Patient: Alexander [name] ? ?Procedure(s) Performed: CYSTOSCOPY WITH INSERTION OF UROLIFT ? ?Patient location during evaluation: Phase II ?Anesthesia Type: General ?Level of consciousness: awake and alert and oriented ?Pain management: pain level controlled ?Vital Signs Assessment: post-procedure vital signs reviewed and stable ?Respiratory status: spontaneous breathing, nonlabored ventilation and respiratory function stable ?Cardiovascular status: blood pressure returned to baseline and stable ?Postop Assessment: no apparent nausea or vomiting ?Anesthetic complications: no ? ? ?No notable events documented. ? ? ?Last Vitals:  ?Vitals:  ? 06/27/21 1500 06/27/21 1502  ?BP:  140/74  ?Pulse: 65 66  ?Resp: (!) 21 17  ?Temp:  36.4 ?C  ?SpO2: 100% 100%  ?  ?Last Pain:  ?Vitals:  ? 06/27/21 1502  ?TempSrc: Oral  ?PainSc: 0-No pain  ? ? ?  ?  ?  ?  ?  ?  ? ?Alexander [name] ? ? ? ? ?

## 2021-06-27 NOTE — Anesthesia Procedure Notes (Signed)
Procedure Name: LMA Insertion ?Date/Time: 06/27/2021 1:50 PM ?Performed by: Myna Bright, CRNA ?Pre-anesthesia Checklist: Patient identified, Emergency Drugs available, Suction available and Patient being monitored ?Patient Re-evaluated:Patient Re-evaluated prior to induction ?Oxygen Delivery Method: Circle system utilized ?Preoxygenation: Pre-oxygenation with 100% oxygen ?Induction Type: IV induction ?Ventilation: Mask ventilation without difficulty ?LMA: LMA inserted ?LMA Size: 4.0 ?Tube type: Oral ?Number of attempts: 1 ?Placement Confirmation: positive ETCO2 and breath sounds checked- equal and bilateral ?Tube secured with: Tape ?Dental Injury: Teeth and Oropharynx as per pre-operative assessment  ? ? ? ? ?

## 2021-06-29 ENCOUNTER — Encounter (HOSPITAL_COMMUNITY): Payer: Self-pay | Admitting: Urology

## 2021-06-29 ENCOUNTER — Inpatient Hospital Stay (HOSPITAL_COMMUNITY): Payer: Medicare Other | Attending: Hematology | Admitting: Hematology

## 2021-06-29 ENCOUNTER — Other Ambulatory Visit (HOSPITAL_COMMUNITY): Payer: Medicare Other

## 2021-06-29 ENCOUNTER — Ambulatory Visit (HOSPITAL_COMMUNITY): Payer: Medicare Other | Admitting: Hematology

## 2021-06-29 ENCOUNTER — Ambulatory Visit (HOSPITAL_COMMUNITY): Payer: Medicare Other

## 2021-06-29 VITALS — BP 79/52 | HR 88 | Temp 99.0°F | Resp 18 | Ht 71.65 in | Wt 132.7 lb

## 2021-06-29 DIAGNOSIS — Z9221 Personal history of antineoplastic chemotherapy: Secondary | ICD-10-CM | POA: Insufficient documentation

## 2021-06-29 DIAGNOSIS — M25561 Pain in right knee: Secondary | ICD-10-CM | POA: Insufficient documentation

## 2021-06-29 DIAGNOSIS — N189 Chronic kidney disease, unspecified: Secondary | ICD-10-CM | POA: Insufficient documentation

## 2021-06-29 DIAGNOSIS — Z85810 Personal history of malignant neoplasm of tongue: Secondary | ICD-10-CM | POA: Diagnosis not present

## 2021-06-29 DIAGNOSIS — M25562 Pain in left knee: Secondary | ICD-10-CM | POA: Insufficient documentation

## 2021-06-29 DIAGNOSIS — C61 Malignant neoplasm of prostate: Secondary | ICD-10-CM | POA: Insufficient documentation

## 2021-06-29 DIAGNOSIS — Z923 Personal history of irradiation: Secondary | ICD-10-CM | POA: Insufficient documentation

## 2021-06-29 DIAGNOSIS — C3492 Malignant neoplasm of unspecified part of left bronchus or lung: Secondary | ICD-10-CM | POA: Diagnosis present

## 2021-06-29 DIAGNOSIS — Z79890 Hormone replacement therapy (postmenopausal): Secondary | ICD-10-CM | POA: Diagnosis not present

## 2021-06-29 DIAGNOSIS — Z5111 Encounter for antineoplastic chemotherapy: Secondary | ICD-10-CM | POA: Diagnosis present

## 2021-06-29 DIAGNOSIS — Z87891 Personal history of nicotine dependence: Secondary | ICD-10-CM | POA: Insufficient documentation

## 2021-06-29 DIAGNOSIS — E039 Hypothyroidism, unspecified: Secondary | ICD-10-CM | POA: Insufficient documentation

## 2021-06-29 DIAGNOSIS — C7902 Secondary malignant neoplasm of left kidney and renal pelvis: Secondary | ICD-10-CM | POA: Diagnosis not present

## 2021-06-29 MED ORDER — HYDROCODONE-ACETAMINOPHEN 10-325 MG PO TABS: 1.0000 | ORAL_TABLET | Freq: Two times a day (BID) | ORAL | 0 refills | Status: DC | PRN

## 2021-06-29 NOTE — Progress Notes (Signed)
START OFF PATHWAY REGIMEN - Non-Small Cell Lung ? ? ?[id number]:Gemcitabine 1,000 mg/m2 D1, 8  q21 Days: ?  A cycle is every 21 days: ?    Gemcitabine  ? ?**Always confirm dose/schedule in your pharmacy ordering system** ? ?Patient Characteristics: ?Stage IV Metastatic, Squamous, Molecular Analysis Completed, Alteration Present and Targeted Therapy Exhausted or EGFR Exon 20 Insertion or KRAS G12C or HER2 Present, and No Prior Chemo/Immunotherapy or No Alteration Present, PS = 0, 1, Second Line -  ?Chemotherapy/Immunotherapy, Prior PD-1/PD-L1 Inhibitor + Chemotherapy or No Prior PD-1/PD-L1 Inhibitor, and Not a Candidate for Immunotherapy ?Therapeutic Status: Stage IV Metastatic ?Histology: Squamous Cell ?Molecular Analysis Results: No Alteration Present ?ECOG Performance Status: 1 ?Chemotherapy/Immunotherapy Line of Therapy: Second Line Chemotherapy/Immunotherapy ?Immunotherapy Candidate Status: Not a Candidate for Immunotherapy ?Prior Immunotherapy Status: Prior PD-1/PD-L1 Inhibitor + Chemotherapy ?Intent of Therapy: ?Non-Curative / Palliative Intent, Discussed with Patient ?

## 2021-06-29 NOTE — Progress Notes (Signed)
? ?[facility] ?[address] ?[place], [place] [address] ? ? ?CLINIC:  ?Medical Oncology/Hematology ? ?PCP:  ?Lemmie Evens, MD ?[address] [place] [address] ?[phone] ? ? 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 ?PRIOR THERAPY: Carboplatin, paclitaxel and Keytruda x 6 cycles from 05/03/2018 to 08/21/2018 ?  ?NGS Results: Foundation 1 MS--stable ? ?CURRENT THERAPY: Keytruda every 3 weeks ? 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 CT neck- Advanced stage oropharyngeal cancer with necrotic adenopathy accounting for the left neck swelling. ?  ?07/28/2014 Initial Diagnosis  ? Oropharyngeal cancer ?  ?08/03/2014 Imaging  ? CT CAP- L supraclavicular lymphadenopathy is not completely visualized. This is better seen on the previous neck CT from 07/27/2014. Otherwise, no evidence for metastatic disease in the chest, abdomen, or pelvis. ?  ?08/03/2014 Imaging  ? Bone scan- Uptake at adjacent anterior LEFT 6, 7, 8 ribs likely representing trauma/fractures. Questionable nonspecific increased tracer localization at the posterior RIGHT 8th and 9th ribs, the adjacent nature which raises a a question of trauma as well ?  ?08/06/2014 Pathology Results  ? Dr. Benjamine Mola- Oropharynx, biopsy, Left - INVASIVE SQUAMOUS CELL CARCINOMA. ?  ?08/12/2014 Procedure  ? Dr. Enrique Sack- 1. Multiple extraction of tooth numbers 6, 17, 22, 23, 24, 25, 26, and 27. 3 Quadrants of alveoloplasty ?  ?08/17/2014 Pathology Results  ? PORT and G-TUBE placed by Dr. Carlis Stable. ?  ?08/26/2014 PET scan  ? Large hypermetabolic mass in the left base of tongue. Activity extends across midline to the right base tongue. 2. Intensely hypermetabolic left cervical metastatic lymph nodes. Lymph nodes extend from the left level II position to the left supraclavi ?  ?09/01/2014 - 09/22/2014 Chemotherapy  ? Concurrent chemoradiation with Cisplatin 100 mg/m2 x 2 cycles with Neulasta  support. Held cycle #3 d/t renal toxicity.  ?  ?09/03/2014 - 10/23/2014 Radiation Therapy  ? Treated in [place], IMRT Isidore Moos).  Base of tongue and bilat neck. Total dose: 70 Gy in 35 fractions. (of note, he did miss several treatments requiring BID dosing towards the end of treatment).  ?  ?01/25/2015 PET scan  ? Near complete resolution of metabolic activity at the base of tongue. Minimal residual activity is likely post treatment effect. 2. Complete resolution of metabolic activity above LEFT cervical lymph nodes. No evidence of residual metabolically active  ?  ?07/17/2016 Procedure  ? Port-a-cath removed [name])  ?  ?05/03/2018 - 08/23/2018 Chemotherapy  ? The patient had dexamethasone (DECADRON) 4 MG tablet, 8 mg, Oral, Daily, 1 of 1 cycle, Start date: 05/01/2018, End date: 10/23/2018 ?palonosetron (ALOXI) injection 0.25 mg, 0.25 mg, Intravenous,  Once, 6 of 6 cycles ?Administration: 0.25 mg (05/03/2018), 0.25 mg (05/24/2018), 0.25 mg (06/14/2018), 0.25 mg (07/09/2018), 0.25 mg (07/30/2018), 0.25 mg (08/21/2018) ?pegfilgrastim-cbqv (UDENYCA) injection 6 mg, 6 mg, Subcutaneous, Once, 5 of 5 cycles ?Administration: 6 mg (05/27/2018), 6 mg (06/17/2018), 6 mg (07/11/2018), 6 mg (08/01/2018), 6 mg (08/23/2018) ?CARBOplatin (PARAPLATIN) 380 mg in sodium chloride 0.9 % 250 mL chemo infusion, 380 mg (100 % of original dose 381 mg), Intravenous,  Once, 6 of 6 cycles ?Dose modification:   (original dose 381 mg, Cycle 1),   (original dose 309.5 mg, Cycle 2), 307.5 mg (original dose 309.5 mg, Cycle 5) ?Administration: 380 mg (05/03/2018), 310 mg (05/24/2018), 310 mg (06/14/2018), 340 mg (07/09/2018), 310 mg (07/30/2018), 350 mg (08/21/2018) ?PACLitaxel (TAXOL) 330 mg in sodium chloride  0.9 % 500 mL chemo infusion (> 80mg /m2), 175 mg/m2 = 330 mg (100 % of original dose 175 mg/m2), Intravenous,  Once, 6 of 6 cycles ?Dose modification: 175 mg/m2 (original dose 175 mg/m2, Cycle 1, Reason: Patient Age) ?Administration: 330 mg (05/03/2018), 330 mg (05/24/2018),  330 mg (06/14/2018), 330 mg (07/09/2018), 330 mg (07/30/2018), 330 mg (08/21/2018) ? ? for chemotherapy treatment.  ? ?  ?05/24/2018 -  Chemotherapy  ? Patient is on Treatment Plan : HEAD/NECK Pembrolizumab/Taxol/Carboplatin Q21D  ?   ?Squamous cell lung cancer, left ([place])  ?06/14/2018 Initial Diagnosis  ? Squamous cell lung cancer, left ([place]) ?  ? ? ?CANCER STAGING: ? 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 ? ?INTERVAL HISTORY:  ?[name], a 70 y.o. male, returns for routine follow-up of his left squamous cell lung cancer. Alexander [name] was last seen on 06/08/2021.  ? ?Today he reports feeling good. He reports pain in his knees and penis. He is taking hydrocodone BID.  ? ?REVIEW OF SYSTEMS:  ?Review of Systems  ?Constitutional:  Negative for appetite change and fatigue.  ?Gastrointestinal:  Positive for abdominal pain (7/10).  ?Genitourinary:  Positive for pelvic pain (7/10 groin).   ?Musculoskeletal:  Positive for arthralgias (7/10 knee).  ?All other systems reviewed and are negative. ? ?PAST MEDICAL/SURGICAL HISTORY:  ?Past Medical History:  ?Diagnosis Date  ? GERD (gastroesophageal reflux disease)   ? Mass of neck   ? 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 dx. 3 weeks ago.- Dr. Oneal [facility] [place], [place].  ? 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 SCCa of Left BOT  ? ?Past Surgical History:  ?Procedure Laterality Date  ? BIOPSY  01/15/2018  ? Procedure: BIOPSY;  Surgeon: Danie Binder, MD;  [facility];  Service: Endoscopy;;  gastric  ? COLONOSCOPY N/A 03/13/2016  ? Procedure: COLONOSCOPY;  Surgeon: Danie Binder, MD;  [facility];  Service: Endoscopy;  Laterality: N/A;  2:15 PM  ? CYSTOSCOPY WITH INSERTION OF UROLIFT N/A 06/27/2021  ? Procedure: CYSTOSCOPY WITH INSERTION OF  UROLIFT;  Surgeon: Cleon Gustin, MD;  [facility];  Service: Urology;  Laterality: N/A;  ? ESOPHAGOGASTRODUODENOSCOPY (EGD) WITH PROPOFOL N/A 08/17/2014  ? Procedure: ESOPHAGOGASTRODUODENOSCOPY (EGD) WITH PROPOFOL (procedure #1);  Surgeon: Aviva Signs Md, MD;  [facility];  Service: General;  Laterality: N/A;  ? ESOPHAGOGASTRODUODENOSCOPY (EGD) WITH PROPOFOL N/A 01/15/2018  ? Procedure: ESOPHAGOGASTRODUODENOSCOPY (EGD) WITH PROPOFOL;  Surgeon: Danie Binder, MD;  [facility];  Service: Endoscopy;  Laterality: N/A;  9:30am  ? MULTIPLE EXTRACTIONS WITH ALVEOLOPLASTY N/A 08/12/2014  ? Procedure: Extraction of tooth #'s 6,17,22,23,24,25,26,27 with alveoloplasty;  Surgeon: Lenn Cal, DDS;  [facility];  Service: Oral Surgery;  Laterality: N/A;  ? PANENDOSCOPY N/A 08/06/2014  ? Procedure: PANENDOSCOPY WITH BIOPSY;  Surgeon: Leta Baptist, MD;  Location: [facility];  Service: ENT;  Laterality: N/A;  ? PEG PLACEMENT Left 08/17/14  ? PEG PLACEMENT N/A 08/17/2014  ? Procedure: PERCUTANEOUS ENDOSCOPIC GASTROSTOMY (PEG) PLACEMENT (procedure #1);  Surgeon: Aviva Signs Md, MD;  [facility];  Service: General;  Laterality: N/A;  ? PORT-A-CATH REMOVAL Right 07/17/2016  ? Procedure: MINOR REMOVAL PORT-A-CATH;  Surgeon: Aviva Signs, MD;  [facility];  Service: General;  Laterality: Right;  ?  PORTACATH PLACEMENT Right 08/17/14  ? PORTACATH PLACEMENT Right 08/17/2014  ? Procedure: INSERTION PORT-A-CATH (procedure #2);  Surgeon: Aviva Signs Md, MD;  [facility];  Service: General;  Laterality: Right;  ? PORTACATH PLACEMENT Left 04/26/2018  ? Procedure: INSERTION PORT-A-CATH (attached catheter in left subclavian);  Surgeon: Aviva Signs, MD;  [facility];  Service: General;  Laterality: Left;  ? SAVORY DILATION N/A 01/15/2018  ? Procedure: SAVORY DILATION;  Surgeon: Danie Binder, MD;  [facility];  Service: Endoscopy;  Laterality: N/A;  ? VIDEO  BRONCHOSCOPY WITH ENDOBRONCHIAL ULTRASOUND N/A 04/15/2018  ? Procedure: VIDEO BRONCHOSCOPY WITH ENDOBRONCHIAL ULTRASOUND;  Surgeon: Melrose Nakayama, MD;  Location: [facility];  Service: Thoracic;  Laterality: N/A;  ? ?

## 2021-06-30 ENCOUNTER — Ambulatory Visit (INDEPENDENT_AMBULATORY_CARE_PROVIDER_SITE_OTHER): Payer: Medicare Other | Admitting: Physician Assistant

## 2021-06-30 VITALS — BP 85/59 | HR 72 | Ht 73.0 in | Wt 134.0 lb

## 2021-06-30 DIAGNOSIS — N401 Enlarged prostate with lower urinary tract symptoms: Secondary | ICD-10-CM | POA: Diagnosis not present

## 2021-06-30 DIAGNOSIS — N138 Other obstructive and reflux uropathy: Secondary | ICD-10-CM

## 2021-06-30 NOTE — Progress Notes (Signed)
Fill and Pull Catheter Removal ? ?Patient is present today for a catheter removal.  Patient was cleaned and prepped in a sterile fashion 142ml of sterile water/ saline was instilled into the bladder when the patient felt the urge to urinate. 51ml of water was then drained from the balloon.  A 16FR foley cath was removed from the bladder no complications were noted .  Patient as then given some time to void on their own.  Patient can void  56ml on their own after some time.  Patient tolerated well. ? ? ?Performed by: Levi Aland, CMA ? ?Follow up/ Additional notes: Patient will return later today for a PVR. ?

## 2021-06-30 NOTE — Progress Notes (Signed)
? ?Assessment: ?1. Benign prostatic hyperplasia with urinary obstruction   ? ?Plan: ?FU in 1 month for UA, PVR and OV with Dr. Alyson Ingles.  If he is unable to void after leaving the office, he will return by 2:30 for PVR and reinsertion of the Foley catheter.  If no symptoms of retention, he will keep follow-up in 10-14 days for PVR and UA as discussed. ? ?Chief Complaint: ?No chief complaint on file. ? ? ?HPI: ?Alexander [name] is a 71 y.o. male who presents for continued evaluation of BPH with bladder outlet obstruction.  Patient is status post UroLift procedure performed 3 days ago on 06/27/2021.  He has had mild hematuria since the procedure, but no fever, chills, nausea, pain. ? ?Portions of the above documentation were copied from a prior visit for review purposes only. ? ?Allergies: ?No Known Allergies ? ?PMH: ?Past Medical History:  ?Diagnosis Date  ? GERD (gastroesophageal reflux disease)   ? Mass of neck   ? 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 dx. 3 weeks ago.- Dr. Oneal Deputy center [place], [place].  ? 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 SCCa of Left BOT  ? ? ?PSH: ?Past Surgical History:  ?Procedure Laterality Date  ? BIOPSY  01/15/2018  ? Procedure: BIOPSY;  Surgeon: Danie Binder, MD;  [facility];  Service: Endoscopy;;  gastric  ? COLONOSCOPY N/A 03/13/2016  ? Procedure: COLONOSCOPY;  Surgeon: Danie Binder, MD;  [facility];  Service: Endoscopy;  Laterality: N/A;  2:15 PM  ? CYSTOSCOPY WITH INSERTION OF UROLIFT N/A 06/27/2021  ? Procedure: CYSTOSCOPY WITH INSERTION OF UROLIFT;  Surgeon: Cleon Gustin, MD;  [facility];  Service: Urology;  Laterality: N/A;  ? ESOPHAGOGASTRODUODENOSCOPY (EGD) WITH PROPOFOL N/A 08/17/2014  ? Procedure: ESOPHAGOGASTRODUODENOSCOPY (EGD) WITH PROPOFOL (procedure #1);  Surgeon: Aviva Signs Md, MD;  [facility];  Service: General;   Laterality: N/A;  ? ESOPHAGOGASTRODUODENOSCOPY (EGD) WITH PROPOFOL N/A 01/15/2018  ? Procedure: ESOPHAGOGASTRODUODENOSCOPY (EGD) WITH PROPOFOL;  Surgeon: Danie Binder, MD;  [facility];  Service: Endoscopy;  Laterality: N/A;  9:30am  ? MULTIPLE EXTRACTIONS WITH ALVEOLOPLASTY N/A 08/12/2014  ? Procedure: Extraction of tooth #'s 6,17,22,23,24,25,26,27 with alveoloplasty;  Surgeon: Lenn Cal, DDS;  [facility];  Service: Oral Surgery;  Laterality: N/A;  ? PANENDOSCOPY N/A 08/06/2014  ? Procedure: PANENDOSCOPY WITH BIOPSY;  Surgeon: Leta Baptist, MD;  Location: [facility];  Service: ENT;  Laterality: N/A;  ? PEG PLACEMENT Left 08/17/14  ? PEG PLACEMENT N/A 08/17/2014  ? Procedure: PERCUTANEOUS ENDOSCOPIC GASTROSTOMY (PEG) PLACEMENT (procedure #1);  Surgeon: Aviva Signs Md, MD;  [facility];  Service: General;  Laterality: N/A;  ? PORT-A-CATH REMOVAL Right 07/17/2016  ? Procedure: MINOR REMOVAL PORT-A-CATH;  Surgeon: Aviva Signs, MD;  [facility];  Service: General;  Laterality: Right;  ? PORTACATH PLACEMENT Right 08/17/14  ? PORTACATH PLACEMENT Right 08/17/2014  ? Procedure: INSERTION PORT-A-CATH (procedure #2);  Surgeon: Aviva Signs Md, MD;  [facility];  Service: General;  Laterality: Right;  ? PORTACATH PLACEMENT Left 04/26/2018  ? Procedure: INSERTION PORT-A-CATH (attached catheter in left subclavian);  Surgeon: Aviva Signs, MD;  [facility];  Service: General;  Laterality: Left;  ? SAVORY DILATION N/A 01/15/2018  ? Procedure: SAVORY DILATION;  Surgeon: Danie Binder, MD;  [facility];  Service: Endoscopy;  Laterality: N/A;  ?  VIDEO BRONCHOSCOPY WITH ENDOBRONCHIAL ULTRASOUND N/A 04/15/2018  ? Procedure: VIDEO BRONCHOSCOPY WITH ENDOBRONCHIAL ULTRASOUND;  Surgeon: Melrose Nakayama, MD;  Location: [facility];  Service: Thoracic;  Laterality: N/A;  ? ? ?SH: ?Social History  ? ?Tobacco Use  ? Smoking status: Former  ?  Packs/day: 0.50  ?  Years: 30.00  ?   Pack years: 15.00  ?  Types: Cigarettes  ?  Quit date: 07/22/2014  ?  Years since quitting: 6.9  ? Smokeless tobacco: Never  ?Vaping Use  ? Vaping Use: Never used  ?Substance Use Topics  ? Alcohol use: Not Currently  ?  Alcohol/week: 0.0 standard drinks  ?  Comment: None currently (11/09/17); previously 1-2 beers on the weekend  ? Drug use: No  ? ? ?ROS: ?Constitutional:  Negative for fever, chills, weight loss ?CV: Negative for chest pain ?Respiratory:  Negative for shortness of breath, wheezing, sleep apnea, frequent cough ?GI:  Negative for nausea, vomiting, bloody stool, GERD ? ?PE: ?BP (!) 85/59   Pulse 72   Ht 6\' 1"  (1.854 m)   Wt 134 lb (60.8 kg)   BMI 17.68 kg/m?  ?GENERAL APPEARANCE: Chronically ill appearing, thin ?HEENT:  Atraumatic, normocephalic ?ABDOMEN:  Soft, non-tender, no masses ?EXTREMITIES:  Moves all extremities well, without clubbing, cyanosis, or edema ?NEUROLOGIC:  Alert and oriented x 3, slow gait ? ? ?Results: ?Laboratory Data: ?Lab Results  ?Component Value Date  ? WBC 4.3 06/08/2021  ? HGB 10.7 (L) 06/08/2021  ? HCT 33.4 (L) 06/08/2021  ? MCV 99.1 06/08/2021  ? PLT 175 06/08/2021  ? ? ?Lab Results  ?Component Value Date  ? CREATININE 1.57 (H) 06/08/2021  ? ? ? ?Urinalysis ?   ?Component Value Date/Time  ? [place] YELLOW 05/05/2021 1841  ? APPEARANCEUR CLEAR 05/05/2021 1841  ? APPEARANCEUR Clear 12/15/2020 1008  ? LABSPEC 1.015 05/05/2021 1841  ? PHURINE 5.0 05/05/2021 1841  ? [place] NEGATIVE 05/05/2021 1841  ? [place] NEGATIVE 05/05/2021 1841  ? [place] NEGATIVE 05/05/2021 1841  ? BILIRUBINUR Negative 12/15/2020 1008  ? [place] NEGATIVE 05/05/2021 1841  ? [place] NEGATIVE 05/05/2021 1841  ? NITRITE NEGATIVE 05/05/2021 1841  ? LEUKOCYTESUR NEGATIVE 05/05/2021 1841  ? ? ?Lab Results  ?Component Value Date  ? LABMICR See below: 12/15/2020  ? WBCUA 0-5 12/15/2020  ? LABEPIT 0-10 12/15/2020  ? BACTERIA None seen 12/15/2020  ? ? ?Pertinent Imaging: ? ?No results found for this or  any previous visit (from the past 24 hour(s)).  ?

## 2021-07-01 ENCOUNTER — Ambulatory Visit (INDEPENDENT_AMBULATORY_CARE_PROVIDER_SITE_OTHER): Payer: Medicare Other | Admitting: Physician Assistant

## 2021-07-01 VITALS — BP 135/85 | HR 65 | Ht 73.0 in | Wt 134.0 lb

## 2021-07-01 DIAGNOSIS — R339 Retention of urine, unspecified: Secondary | ICD-10-CM

## 2021-07-01 LAB — BLADDER SCAN AMB NON-IMAGING: Scan Result: 582

## 2021-07-01 MED ORDER — TAMSULOSIN HCL 0.4 MG PO CAPS: 0.4000 mg | ORAL_CAPSULE | Freq: Two times a day (BID) | ORAL | 11 refills | Status: AC

## 2021-07-01 NOTE — Patient Instructions (Signed)
?Foley Catheter Care and Patient Education ? ?Perform catheter care every day.  You can do this while in the shower, but NOT while taking a tub bath. ? ?You will need the following supplies: ?-mild soap, such as Dove ?-water ?-a clean washcloth (not one already used for bathing) or a 4x4 piece of gauze ?-1 Cath-Secure ?-night drainage bag ?-2 alcohol swabs ? ?Was you hands thoroughly with soap and water ?Using mild soap and water, clan your genital area ?Men should retract the foreskin, if needed, and clean the area, including the penis ?Women should separate the labia, and clean the area from front to back ? ?3. Clean your urinary opening, which is where the catheter enters your body. ?4. Clean the catheter from where it enters your body and then down, away from your body.  Hold the catheter at the point it enters your body so that you don't put tension on it. ?5. Rinse the area well and dry it gently. ? ?Changing the drainage bag ?You will change your drainage bag twice a day ?-in the morning after you shower, change he night bag to the leg bag ?-at night before you go to bed, change the leg bag to the night bag ? ?Wash your hands thoroughly with soap and water ?Empty the urine from the drainage bag into the toilet before you change it ?Pinch off the catheter with your fingers and disconnect the used bag ?Wipe the end of the catheter using an alcohol pad ?Wipe the connector on the bag using the second alcohol pad ?Connect the clean bag to the catheter and release your finger pinch ?Check all connections.  Straighten any kinks or twits in the tubing ? ?Caring for the Leg bag ?-always wear the leg bag below your knee.  This will help it drain. ?-keep the leg bag secure with the velcro straps.  If the straps leave a mark on your leg they are to tight and should be loosened.  Leaving the straps too tight can decrease you circulation and lead to blood clots. ?-empty the leg bag through the spout at the bottom every  2-4 hours as needed.  Do not let the bag become completely full. ?-do not lie down for longer than 2 hours while you are wearing the leg bag. ? ?Caring for the Night Bag ?-always keep the night bag below the level of your bladder . ?-to hang your night bag while you sleep, place a clean plastic bag inside of a wastebasket.  Hang the night bag on the inside of the wastebasket. ? ?Cleaning the Drainage bag ?Wash you hands thoroughly with soap and water. ?Rinse the equipment with cool water.  Do not use hot water it can damage the plastic equipment. ?Was the equipment with a mild liquid detergent (ivory) and rinse with cool water. ?To decrease odor, fill the bag halfway with a mixture of 1 part vinegar and 3 parts water. Shake the bag and let it sit for 15 minutes. ?Rinse the bag with cool water and hang it up to dry. ? ?Preventing infection ?-keep the drainage bag below the level of your bladder and off the floor at all times. ?-keep the catheter secured to your thigh to prevent it from moving. ?-do not lie on or block the flow of urine in the tubing. ?-shower daily to keep the catheter clean. ?-clean your hands before and after touching the catheter or bag. ?-the spout of the drainage bag should never touch the side of  the toilet or any emptying container. ? ?Special Points ?-You may see some blood or urine around where the catheter enters your body, especially when walking or having a bowel movement.  This is normal, as long as there is urine draining into the drainage bag.  If you experience significant leakage around  catheter tube where it enters your urethra possibly associated with lower abdominal cramping you could be having a bladder spasm.  Please notify your doctor and we can prescribe you a medication to improve your symptoms. ?-drink 1-2 glasses of liquids every 2 hours while you're awake. ? ?Call your doctor immediately if ?-your catheter comes out, do not try to replace it yourself ?-you have  temperature of 101F (38.8C) or higher ?-you have bright red blood or large blood clots in your urine ?-you have abdominal pain and no urine in your catheter bag ?  ?

## 2021-07-01 NOTE — Progress Notes (Signed)
? ?Assessment: ?1. Urinary retention   ? ? ?Plan: ?Foley placed and prescription for tamsulosin sent to the patient's pharmacy.  Rapaflo canceled.  He will keep appointment with Dr. Alyson Ingles as already scheduled. ? ?Chief Complaint: ?No chief complaint on file. ? ? ?HPI: ?Alexander [name] is a 71 y.o. male who presents for continued evaluation of postop UroLift with incomplete emptying of the bladder.  Since voiding trial yesterday, the patient states he voided very small amount yesterday but has not been able to void at all today.  He has lower abdominal discomfort as well. ? ?PVR greater than 500 mL. ? ? ?Portions of the above documentation were copied from a prior visit for review purposes only. ? ?Allergies: ?No Known Allergies ? ?PMH: ?Past Medical History:  ?Diagnosis Date  ? GERD (gastroesophageal reflux disease)   ? Mass of neck   ? 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 dx. 3 weeks ago.- Dr. Oneal Deputy [facility] [place], [place].  ? 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 SCCa of Left BOT  ? ? ?PSH: ?Past Surgical History:  ?Procedure Laterality Date  ? BIOPSY  01/15/2018  ? Procedure: BIOPSY;  Surgeon: Danie Binder, MD;  [facility];  Service: Endoscopy;;  gastric  ? COLONOSCOPY N/A 03/13/2016  ? Procedure: COLONOSCOPY;  Surgeon: Danie Binder, MD;  [facility];  Service: Endoscopy;  Laterality: N/A;  2:15 PM  ? CYSTOSCOPY WITH INSERTION OF UROLIFT N/A 06/27/2021  ? Procedure: CYSTOSCOPY WITH INSERTION OF UROLIFT;  Surgeon: Cleon Gustin, MD;  [facility];  Service: Urology;  Laterality: N/A;  ? ESOPHAGOGASTRODUODENOSCOPY (EGD) WITH PROPOFOL N/A 08/17/2014  ? Procedure: ESOPHAGOGASTRODUODENOSCOPY (EGD) WITH PROPOFOL (procedure #1);  Surgeon: Aviva Signs Md, MD;  [facility];  Service: General;  Laterality: N/A;  ? ESOPHAGOGASTRODUODENOSCOPY (EGD) WITH PROPOFOL N/A 01/15/2018   ? Procedure: ESOPHAGOGASTRODUODENOSCOPY (EGD) WITH PROPOFOL;  Surgeon: Danie Binder, MD;  [facility];  Service: Endoscopy;  Laterality: N/A;  9:30am  ? MULTIPLE EXTRACTIONS WITH ALVEOLOPLASTY N/A 08/12/2014  ? Procedure: Extraction of tooth #'s 6,17,22,23,24,25,26,27 with alveoloplasty;  Surgeon: Lenn Cal, DDS;  [facility];  Service: Oral Surgery;  Laterality: N/A;  ? PANENDOSCOPY N/A 08/06/2014  ? Procedure: PANENDOSCOPY WITH BIOPSY;  Surgeon: Leta Baptist, MD;  Location: [facility];  Service: ENT;  Laterality: N/A;  ? PEG PLACEMENT Left 08/17/14  ? PEG PLACEMENT N/A 08/17/2014  ? Procedure: PERCUTANEOUS ENDOSCOPIC GASTROSTOMY (PEG) PLACEMENT (procedure #1);  Surgeon: Aviva Signs Md, MD;  [facility];  Service: General;  Laterality: N/A;  ? PORT-A-CATH REMOVAL Right 07/17/2016  ? Procedure: MINOR REMOVAL PORT-A-CATH;  Surgeon: Aviva Signs, MD;  [facility];  Service: General;  Laterality: Right;  ? PORTACATH PLACEMENT Right 08/17/14  ? PORTACATH PLACEMENT Right 08/17/2014  ? Procedure: INSERTION PORT-A-CATH (procedure #2);  Surgeon: Aviva Signs Md, MD;  [facility];  Service: General;  Laterality: Right;  ? PORTACATH PLACEMENT Left 04/26/2018  ? Procedure: INSERTION PORT-A-CATH (attached catheter in left subclavian);  Surgeon: Aviva Signs, MD;  [facility];  Service: General;  Laterality: Left;  ? SAVORY DILATION N/A 01/15/2018  ? Procedure: SAVORY DILATION;  Surgeon: Danie Binder, MD;  [facility];  Service: Endoscopy;  Laterality: N/A;  ? VIDEO BRONCHOSCOPY WITH ENDOBRONCHIAL ULTRASOUND N/A 04/15/2018  ? Procedure: VIDEO BRONCHOSCOPY WITH ENDOBRONCHIAL ULTRASOUND;  Surgeon:  Melrose Nakayama, MD;  Location: [facility];  Service: Thoracic;  Laterality: N/A;  ? ? ?SH: ?Social History  ? ?Tobacco Use  ? Smoking status: Former  ?  Packs/day: 0.50  ?  Years: 30.00  ?  Pack years: 15.00  ?  Types: Cigarettes  ?  Quit date: 07/22/2014  ?  Years since  quitting: 6.9  ? Smokeless tobacco: Never  ?Vaping Use  ? Vaping Use: Never used  ?Substance Use Topics  ? Alcohol use: Not Currently  ?  Alcohol/week: 0.0 standard drinks  ?  Comment: None currently (11/09/17); previously 1-2 beers on the weekend  ? Drug use: No  ? ? ?ROS: ?Constitutional:  Negative for fever, chills, weight loss ?CV: Negative for chest pain ?GI:  Negative for nausea, vomiting, bloody stool, GERD ? ?PE: ?BP 135/85   Pulse 65   Ht 6\' 1"  (1.854 m)   Wt 134 lb (60.8 kg)   BMI 17.68 kg/m?  ?GENERAL APPEARANCE: Thin, NAD ?HEENT:  Atraumatic, normocephalic ?NECK:  Supple. Trachea midline ?ABDOMEN:  Soft, distended bladder noted upon presentation ?EXTREMITIES:  Moves all extremities well, without clubbing, cyanosis, or edema ? ? ?Results: ?Laboratory Data: ?Lab Results  ?Component Value Date  ? WBC 4.3 06/08/2021  ? HGB 10.7 (L) 06/08/2021  ? HCT 33.4 (L) 06/08/2021  ? MCV 99.1 06/08/2021  ? PLT 175 06/08/2021  ? ? ?Lab Results  ?Component Value Date  ? CREATININE 1.57 (H) 06/08/2021  ? ? ? ?Urinalysis ?   ?Component Value Date/Time  ? [place] YELLOW 05/05/2021 1841  ? APPEARANCEUR CLEAR 05/05/2021 1841  ? APPEARANCEUR Clear 12/15/2020 1008  ? LABSPEC 1.015 05/05/2021 1841  ? PHURINE 5.0 05/05/2021 1841  ? [place] NEGATIVE 05/05/2021 1841  ? [place] NEGATIVE 05/05/2021 1841  ? [place] NEGATIVE 05/05/2021 1841  ? BILIRUBINUR Negative 12/15/2020 1008  ? [place] NEGATIVE 05/05/2021 1841  ? [place] NEGATIVE 05/05/2021 1841  ? NITRITE NEGATIVE 05/05/2021 1841  ? LEUKOCYTESUR NEGATIVE 05/05/2021 1841  ? ? ?Lab Results  ?Component Value Date  ? LABMICR See below: 12/15/2020  ? WBCUA 0-5 12/15/2020  ? LABEPIT 0-10 12/15/2020  ? BACTERIA None seen 12/15/2020  ? ? ?Pertinent Imaging: ?No results found for this or any previous visit. ? ?No results found for this or any previous visit. ? ?No results found for this or any previous visit. ? ?No results found for this or any previous visit. ? ?No results  found for this or any previous visit. ? ?No results found for this or any previous visit. ? ?No results found for this or any previous visit. ? ?No results found for this or any previous visit. ? ?Results for orders placed or performed in visit on 07/01/21 (from the past 24 hour(s))  ?BLADDER SCAN AMB NON-IMAGING  ? Collection Time: 07/01/21 11:48 AM  ?Result Value Ref Range  ? Scan Result [id number]   ?  ?

## 2021-07-01 NOTE — Progress Notes (Signed)
post void residual=582 ? ?Simple Catheter Placement ? ?Due to urinary retention patient is present today for a foley cath placement.  Patient was cleaned and prepped in a sterile fashion with betadine. A 16 FR foley catheter was inserted, urine return was noted  550ml, urine was yellow in color.  The balloon was filled with 10cc of sterile water.  A leg bag was attached for drainage. Patient was also given a night bag to take home and was given instruction on how to change from one bag to another.  Patient was given instruction on proper catheter care.  Patient tolerated well, no complications were noted  ? ?Performed by: Levi Aland, CMA ? ?Additional notes/ Follow up: Follow up as scheduled.   ?

## 2021-07-05 ENCOUNTER — Inpatient Hospital Stay (HOSPITAL_COMMUNITY): Payer: Medicare Other

## 2021-07-05 VITALS — BP 119/65 | HR 67 | Temp 97.5°F | Resp 18

## 2021-07-05 DIAGNOSIS — Z5111 Encounter for antineoplastic chemotherapy: Secondary | ICD-10-CM | POA: Diagnosis not present

## 2021-07-05 DIAGNOSIS — C109 Malignant neoplasm of oropharynx, unspecified: Secondary | ICD-10-CM

## 2021-07-05 DIAGNOSIS — C3492 Malignant neoplasm of unspecified part of left bronchus or lung: Secondary | ICD-10-CM

## 2021-07-05 LAB — COMPREHENSIVE METABOLIC PANEL
ALT: 17 U/L (ref 0–44)
AST: 22 U/L (ref 15–41)
Albumin: 3.9 g/dL (ref 3.5–5.0)
Alkaline Phosphatase: 52 U/L (ref 38–126)
Anion gap: 9 (ref 5–15)
BUN: 37 mg/dL — ABNORMAL HIGH (ref 8–23)
CO2: 26 mmol/L (ref 22–32)
Calcium: 9.2 mg/dL (ref 8.9–10.3)
Chloride: 104 mmol/L (ref 98–111)
Creatinine, Ser: 1.96 mg/dL — ABNORMAL HIGH (ref 0.61–1.24)
GFR, Estimated: 36 mL/min — ABNORMAL LOW (ref >60)
Glucose, Bld: 85 mg/dL (ref 70–99)
Potassium: 4.8 mmol/L (ref 3.5–5.1)
Sodium: 139 mmol/L (ref 135–145)
Total Bilirubin: 0.6 mg/dL (ref 0.3–1.2)
Total Protein: 7.3 g/dL (ref 6.5–8.1)

## 2021-07-05 LAB — CBC WITH DIFFERENTIAL/PLATELET
Abs Immature Granulocytes: 0.01 10*3/uL (ref 0.00–0.07)
Basophils Absolute: 0 10*3/uL (ref 0.0–0.1)
Basophils Relative: 0 %
Eosinophils Absolute: 0.1 10*3/uL (ref 0.0–0.5)
Eosinophils Relative: 3 %
HCT: 32.9 % — ABNORMAL LOW (ref 39.0–52.0)
Hemoglobin: 10.6 g/dL — ABNORMAL LOW (ref 13.0–17.0)
Immature Granulocytes: 0 %
Lymphocytes Relative: 23 %
Lymphs Abs: 0.8 10*3/uL (ref 0.7–4.0)
MCH: 30.8 pg (ref 26.0–34.0)
MCHC: 32.2 g/dL (ref 30.0–36.0)
MCV: 95.6 fL (ref 80.0–100.0)
Monocytes Absolute: 0.4 10*3/uL (ref 0.1–1.0)
Monocytes Relative: 12 %
Neutro Abs: 2 10*3/uL (ref 1.7–7.7)
Neutrophils Relative %: 62 %
Platelets: 148 10*3/uL — ABNORMAL LOW (ref 150–400)
RBC: 3.44 MIL/uL — ABNORMAL LOW (ref 4.22–5.81)
RDW: 15 % (ref 11.5–15.5)
WBC: 3.3 10*3/uL — ABNORMAL LOW (ref 4.0–10.5)
nRBC: 0 % (ref 0.0–0.2)

## 2021-07-05 LAB — MAGNESIUM: Magnesium: 2 mg/dL (ref 1.7–2.4)

## 2021-07-05 MED ORDER — PALONOSETRON HCL INJECTION 0.25 MG/5ML
0.2500 mg | Freq: Once | INTRAVENOUS | Status: AC
  Administered 2021-07-05: 0.25 mg via INTRAVENOUS
  Filled 2021-07-05: qty 5

## 2021-07-05 MED ORDER — SODIUM CHLORIDE 0.9 % IV SOLN: Freq: Once | INTRAVENOUS | Status: AC

## 2021-07-05 MED ORDER — SODIUM CHLORIDE 0.9 % IV SOLN
1000.0000 mg/m2 | Freq: Once | INTRAVENOUS | Status: AC
  Administered 2021-07-05: 1748 mg via INTRAVENOUS
  Filled 2021-07-05: qty 45.97

## 2021-07-05 MED ORDER — SODIUM CHLORIDE 0.9% FLUSH
10.0000 mL | INTRAVENOUS | Status: DC | PRN
  Administered 2021-07-05: 10 mL

## 2021-07-05 MED ORDER — HEPARIN SOD (PORK) LOCK FLUSH 100 UNIT/ML IV SOLN
500.0000 [IU] | Freq: Once | INTRAVENOUS | Status: AC | PRN
  Administered 2021-07-05: 500 [IU]

## 2021-07-05 NOTE — Progress Notes (Signed)
Patient presents today for D1,C1 Gemzar infusion per providers order.  Vital signs within parameters for treatment.  Labs pending.  Patient has no new complaints at this time. ? ?Creatinine 1.97, MD notified and okay to proceed with treatment.  Patient to receive 500 cc bolus with treatment. ? ?Gemzar and 500 cc bolus of Normal Saline given today per MD orders.  Tolerated infusions without adverse affects.  Vital signs stable.  No complaints at this time.  Discharge from clinic ambulatory in stable condition.  Alert and oriented X 3.  Follow up with [facility] as scheduled.  ?

## 2021-07-05 NOTE — Patient Instructions (Signed)
Alexander [name]  Discharge Instructions: ?Thank you for choosing [facility] to provide your oncology and hematology care.  ?If you have a lab appointment with the [facility], please come in thru the Main Entrance and check in at the main information desk. ? ?Wear comfortable clothing and clothing appropriate for easy access to any Portacath or PICC line.  ? ?We strive to give you quality time with your provider. You may need to reschedule your appointment if you arrive late (15 or more minutes).  Arriving late affects you and other patients whose appointments are after yours.  Also, if you miss three or more appointments without notifying the office, you may be dismissed from the clinic at the provider?s discretion.    ?  ?For prescription refill requests, have your pharmacy contact our office and allow 72 hours for refills to be completed.   ? ?Today you received the following chemotherapy and/or immunotherapy agents Gemzar    ?  ?To help prevent nausea and vomiting after your treatment, we encourage you to take your nausea medication as directed. ? ?BELOW ARE SYMPTOMS THAT SHOULD BE REPORTED IMMEDIATELY: ?*FEVER GREATER THAN 100.4 F (38 ?C) OR HIGHER ?*CHILLS OR SWEATING ?*NAUSEA AND VOMITING THAT IS NOT CONTROLLED WITH YOUR NAUSEA MEDICATION ?*UNUSUAL SHORTNESS OF BREATH ?*UNUSUAL BRUISING OR BLEEDING ?*URINARY PROBLEMS (pain or burning when urinating, or frequent urination) ?*BOWEL PROBLEMS (unusual diarrhea, constipation, pain near the anus) ?TENDERNESS IN MOUTH AND THROAT WITH OR WITHOUT PRESENCE OF ULCERS (sore throat, sores in mouth, or a toothache) ?UNUSUAL RASH, SWELLING OR PAIN  ?UNUSUAL VAGINAL DISCHARGE OR ITCHING  ? ?Items with * indicate a potential emergency and should be followed up as soon as possible or go to the Emergency Department if any problems should occur. ? ?Please show the CHEMOTHERAPY ALERT CARD or IMMUNOTHERAPY ALERT CARD at check-in to the Emergency  Department and triage nurse. ? ?Should you have questions after your visit or need to cancel or reschedule your appointment, please contact [facility] [phone]  and follow the prompts.  Office hours are 8:00 a.m. to 4:30 p.m. Monday - Friday. Please note that voicemails left after 4:00 p.m. may not be returned until the following business day.  We are closed weekends and major holidays. You have access to a nurse at all times for urgent questions. Please call the main number to the clinic [phone] and follow the prompts. ? ?For any non-urgent questions, you may also contact your provider using MyChart. We now offer e-Visits for anyone 45 and older to request care online for non-urgent symptoms. For details visit [email or web address]. ?  ?Also download the MyChart app! Go to the app store, search "MyChart", open the app, select [facility], and log in with your MyChart username and password. ? ?Due to Covid, a mask is required upon entering the hospital/clinic. If you do not have a mask, one will be given to you upon arrival. For doctor visits, patients may have 1 support person aged 64 or older with them. For treatment visits, patients cannot have anyone with them due to current Covid guidelines and our immunocompromised population.  ?

## 2021-07-06 NOTE — Progress Notes (Signed)
24 hour call back 07/06/21 1535,  Patient states that he feels well.  He has had no nausea vomiting or diarrhea and his appetite has been good.  Advised the patient that if he had any problems arise to call the clinic triage nurse.  Patient agreeable to this. ?

## 2021-07-11 ENCOUNTER — Ambulatory Visit: Payer: Medicare Other | Admitting: Urology

## 2021-07-12 ENCOUNTER — Inpatient Hospital Stay (HOSPITAL_COMMUNITY): Payer: Medicare Other

## 2021-07-12 VITALS — BP 119/61 | HR 73 | Temp 97.8°F | Resp 18

## 2021-07-12 DIAGNOSIS — C109 Malignant neoplasm of oropharynx, unspecified: Secondary | ICD-10-CM

## 2021-07-12 DIAGNOSIS — C3492 Malignant neoplasm of unspecified part of left bronchus or lung: Secondary | ICD-10-CM

## 2021-07-12 DIAGNOSIS — Z5111 Encounter for antineoplastic chemotherapy: Secondary | ICD-10-CM | POA: Diagnosis not present

## 2021-07-12 LAB — CBC WITH DIFFERENTIAL/PLATELET
Abs Immature Granulocytes: 0 10*3/uL (ref 0.00–0.07)
Basophils Absolute: 0 10*3/uL (ref 0.0–0.1)
Basophils Relative: 0 %
Eosinophils Absolute: 0 10*3/uL (ref 0.0–0.5)
Eosinophils Relative: 1 %
HCT: 31.2 % — ABNORMAL LOW (ref 39.0–52.0)
Hemoglobin: 9.9 g/dL — ABNORMAL LOW (ref 13.0–17.0)
Immature Granulocytes: 0 %
Lymphocytes Relative: 52 %
Lymphs Abs: 0.9 10*3/uL (ref 0.7–4.0)
MCH: 30.3 pg (ref 26.0–34.0)
MCHC: 31.7 g/dL (ref 30.0–36.0)
MCV: 95.4 fL (ref 80.0–100.0)
Monocytes Absolute: 0.1 10*3/uL (ref 0.1–1.0)
Monocytes Relative: 6 %
Neutro Abs: 0.7 10*3/uL — ABNORMAL LOW (ref 1.7–7.7)
Neutrophils Relative %: 41 %
Platelets: 103 10*3/uL — ABNORMAL LOW (ref 150–400)
RBC: 3.27 MIL/uL — ABNORMAL LOW (ref 4.22–5.81)
RDW: 14.7 % (ref 11.5–15.5)
WBC: 1.7 10*3/uL — ABNORMAL LOW (ref 4.0–10.5)
nRBC: 0 % (ref 0.0–0.2)

## 2021-07-12 LAB — COMPREHENSIVE METABOLIC PANEL
ALT: 21 U/L (ref 0–44)
AST: 25 U/L (ref 15–41)
Albumin: 3.8 g/dL (ref 3.5–5.0)
Alkaline Phosphatase: 50 U/L (ref 38–126)
Anion gap: 8 (ref 5–15)
BUN: 43 mg/dL — ABNORMAL HIGH (ref 8–23)
CO2: 24 mmol/L (ref 22–32)
Calcium: 8.9 mg/dL (ref 8.9–10.3)
Chloride: 107 mmol/L (ref 98–111)
Creatinine, Ser: 1.7 mg/dL — ABNORMAL HIGH (ref 0.61–1.24)
GFR, Estimated: 43 mL/min — ABNORMAL LOW (ref >60)
Glucose, Bld: 78 mg/dL (ref 70–99)
Potassium: 4.5 mmol/L (ref 3.5–5.1)
Sodium: 139 mmol/L (ref 135–145)
Total Bilirubin: 0.5 mg/dL (ref 0.3–1.2)
Total Protein: 7.5 g/dL (ref 6.5–8.1)

## 2021-07-12 LAB — MAGNESIUM: Magnesium: 2.1 mg/dL (ref 1.7–2.4)

## 2021-07-12 MED ORDER — SODIUM CHLORIDE 0.9% FLUSH
10.0000 mL | INTRAVENOUS | Status: DC | PRN
  Administered 2021-07-12: 10 mL

## 2021-07-12 MED ORDER — SODIUM CHLORIDE 0.9 % IV SOLN
750.0000 mg/m2 | Freq: Once | INTRAVENOUS | Status: AC
  Administered 2021-07-12: 1292 mg via INTRAVENOUS
  Filled 2021-07-12: qty 33.98

## 2021-07-12 MED ORDER — SODIUM CHLORIDE 0.9 % IV SOLN: Freq: Once | INTRAVENOUS | Status: AC

## 2021-07-12 MED ORDER — HEPARIN SOD (PORK) LOCK FLUSH 100 UNIT/ML IV SOLN
500.0000 [IU] | Freq: Once | INTRAVENOUS | Status: AC | PRN
  Administered 2021-07-12: 500 [IU]

## 2021-07-12 MED ORDER — PALONOSETRON HCL INJECTION 0.25 MG/5ML
0.2500 mg | Freq: Once | INTRAVENOUS | Status: AC
  Administered 2021-07-12: 0.25 mg via INTRAVENOUS
  Filled 2021-07-12: qty 5

## 2021-07-12 NOTE — Progress Notes (Signed)
Serum Creatinine 1.7 today and ok to treat verbal order Dr. Delton Coombes. Pharmacy notified.  ANC 0.7 with verbal order ok to treat Dr. Delton Coombes.  Gemzar dose reduced by oncologist.  ? ?Patient tolerated chemotherapy with no complaints voiced.  Side effects with management reviewed with understanding verbalized.  Port site clean and dry with no bruising or swelling noted at site.  Good blood return noted before and after administration of chemotherapy.  Band aid applied.  Patient left in satisfactory condition with VSS and no s/s of distress noted.   ?

## 2021-07-12 NOTE — Patient Instructions (Signed)
[facility]  Discharge Instructions: ?Thank you for choosing [facility] to provide your oncology and hematology care.  ?If you have a lab appointment with the [facility], please come in thru the Main Entrance and check in at the main information desk. ? ?Wear comfortable clothing and clothing appropriate for easy access to any Portacath or PICC line.  ? ?We strive to give you quality time with your provider. You may need to reschedule your appointment if you arrive late (15 or more minutes).  Arriving late affects you and other patients whose appointments are after yours.  Also, if you miss three or more appointments without notifying the office, you may be dismissed from the clinic at the provider?s discretion.    ?  ?For prescription refill requests, have your pharmacy contact our office and allow 72 hours for refills to be completed.   ? ?Today you received the following chemotherapy and/or immunotherapy agents gemzar.  ?  ?To help prevent nausea and vomiting after your treatment, we encourage you to take your nausea medication as directed. ? ?BELOW ARE SYMPTOMS THAT SHOULD BE REPORTED IMMEDIATELY: ?*FEVER GREATER THAN 100.4 F (38 ?C) OR HIGHER ?*CHILLS OR SWEATING ?*NAUSEA AND VOMITING THAT IS NOT CONTROLLED WITH YOUR NAUSEA MEDICATION ?*UNUSUAL SHORTNESS OF BREATH ?*UNUSUAL BRUISING OR BLEEDING ?*URINARY PROBLEMS (pain or burning when urinating, or frequent urination) ?*BOWEL PROBLEMS (unusual diarrhea, constipation, pain near the anus) ?TENDERNESS IN MOUTH AND THROAT WITH OR WITHOUT PRESENCE OF ULCERS (sore throat, sores in mouth, or a toothache) ?UNUSUAL RASH, SWELLING OR PAIN  ?UNUSUAL VAGINAL DISCHARGE OR ITCHING  ? ?Items with * indicate a potential emergency and should be followed up as soon as possible or go to the Emergency Department if any problems should occur. ? ?Please show the CHEMOTHERAPY ALERT CARD or IMMUNOTHERAPY ALERT CARD at check-in to the Emergency Department  and triage nurse. ? ?Should you have questions after your visit or need to cancel or reschedule your appointment, please contact [facility] [phone]  and follow the prompts.  Office hours are 8:00 a.m. to 4:30 p.m. Monday - Friday. Please note that voicemails left after 4:00 p.m. may not be returned until the following business day.  We are closed weekends and major holidays. You have access to a nurse at all times for urgent questions. Please call the main number to the clinic [phone] and follow the prompts. ? ?For any non-urgent questions, you may also contact your provider using MyChart. We now offer e-Visits for anyone 72 and older to request care online for non-urgent symptoms. For details visit [email or web address]. ?  ?Also download the MyChart app! Go to the app store, search "MyChart", open the app, select [facility], and log in with your MyChart username and password. ? ?Due to Covid, a mask is required upon entering the hospital/clinic. If you do not have a mask, one will be given to you upon arrival. For doctor visits, patients may have 1 support person aged 72 or older with them. For treatment visits, patients cannot have anyone with them due to current Covid guidelines and our immunocompromised population.  ?

## 2021-07-26 ENCOUNTER — Inpatient Hospital Stay (HOSPITAL_COMMUNITY): Payer: Medicare Other

## 2021-07-26 ENCOUNTER — Inpatient Hospital Stay (HOSPITAL_COMMUNITY): Payer: Medicare Other | Attending: Hematology | Admitting: Hematology

## 2021-07-26 VITALS — BP 100/67 | HR 71 | Temp 97.7°F | Resp 18

## 2021-07-26 DIAGNOSIS — Z87891 Personal history of nicotine dependence: Secondary | ICD-10-CM | POA: Insufficient documentation

## 2021-07-26 DIAGNOSIS — N189 Chronic kidney disease, unspecified: Secondary | ICD-10-CM | POA: Insufficient documentation

## 2021-07-26 DIAGNOSIS — M25562 Pain in left knee: Secondary | ICD-10-CM | POA: Diagnosis not present

## 2021-07-26 DIAGNOSIS — C7902 Secondary malignant neoplasm of left kidney and renal pelvis: Secondary | ICD-10-CM | POA: Insufficient documentation

## 2021-07-26 DIAGNOSIS — C61 Malignant neoplasm of prostate: Secondary | ICD-10-CM | POA: Insufficient documentation

## 2021-07-26 DIAGNOSIS — E039 Hypothyroidism, unspecified: Secondary | ICD-10-CM

## 2021-07-26 DIAGNOSIS — Z923 Personal history of irradiation: Secondary | ICD-10-CM | POA: Diagnosis not present

## 2021-07-26 DIAGNOSIS — C109 Malignant neoplasm of oropharynx, unspecified: Secondary | ICD-10-CM

## 2021-07-26 DIAGNOSIS — M25561 Pain in right knee: Secondary | ICD-10-CM | POA: Insufficient documentation

## 2021-07-26 DIAGNOSIS — Z9221 Personal history of antineoplastic chemotherapy: Secondary | ICD-10-CM | POA: Insufficient documentation

## 2021-07-26 DIAGNOSIS — C3492 Malignant neoplasm of unspecified part of left bronchus or lung: Secondary | ICD-10-CM

## 2021-07-26 DIAGNOSIS — Z5111 Encounter for antineoplastic chemotherapy: Secondary | ICD-10-CM | POA: Diagnosis present

## 2021-07-26 DIAGNOSIS — Z85810 Personal history of malignant neoplasm of tongue: Secondary | ICD-10-CM | POA: Insufficient documentation

## 2021-07-26 DIAGNOSIS — Z79890 Hormone replacement therapy (postmenopausal): Secondary | ICD-10-CM | POA: Insufficient documentation

## 2021-07-26 LAB — CBC WITH DIFFERENTIAL/PLATELET
Abs Immature Granulocytes: 0 10*3/uL (ref 0.00–0.07)
Basophils Absolute: 0 10*3/uL (ref 0.0–0.1)
Basophils Relative: 0 %
Eosinophils Absolute: 0 10*3/uL (ref 0.0–0.5)
Eosinophils Relative: 1 %
HCT: 29.5 % — ABNORMAL LOW (ref 39.0–52.0)
Hemoglobin: 9.7 g/dL — ABNORMAL LOW (ref 13.0–17.0)
Immature Granulocytes: 0 %
Lymphocytes Relative: 39 %
Lymphs Abs: 0.9 10*3/uL (ref 0.7–4.0)
MCH: 31.3 pg (ref 26.0–34.0)
MCHC: 32.9 g/dL (ref 30.0–36.0)
MCV: 95.2 fL (ref 80.0–100.0)
Monocytes Absolute: 0.3 10*3/uL (ref 0.1–1.0)
Monocytes Relative: 13 %
Neutro Abs: 1 10*3/uL — ABNORMAL LOW (ref 1.7–7.7)
Neutrophils Relative %: 47 %
Platelets: 226 10*3/uL (ref 150–400)
RBC: 3.1 MIL/uL — ABNORMAL LOW (ref 4.22–5.81)
RDW: 15.3 % (ref 11.5–15.5)
WBC: 2.2 10*3/uL — ABNORMAL LOW (ref 4.0–10.5)
nRBC: 0 % (ref 0.0–0.2)

## 2021-07-26 LAB — COMPREHENSIVE METABOLIC PANEL
ALT: 16 U/L (ref 0–44)
AST: 19 U/L (ref 15–41)
Albumin: 3.8 g/dL (ref 3.5–5.0)
Alkaline Phosphatase: 56 U/L (ref 38–126)
Anion gap: 10 (ref 5–15)
BUN: 35 mg/dL — ABNORMAL HIGH (ref 8–23)
CO2: 24 mmol/L (ref 22–32)
Calcium: 8.9 mg/dL (ref 8.9–10.3)
Chloride: 105 mmol/L (ref 98–111)
Creatinine, Ser: 2.14 mg/dL — ABNORMAL HIGH (ref 0.61–1.24)
GFR, Estimated: 32 mL/min — ABNORMAL LOW (ref >60)
Glucose, Bld: 110 mg/dL — ABNORMAL HIGH (ref 70–99)
Potassium: 4.1 mmol/L (ref 3.5–5.1)
Sodium: 139 mmol/L (ref 135–145)
Total Bilirubin: 0.3 mg/dL (ref 0.3–1.2)
Total Protein: 7.5 g/dL (ref 6.5–8.1)

## 2021-07-26 LAB — MAGNESIUM: Magnesium: 2.1 mg/dL (ref 1.7–2.4)

## 2021-07-26 MED ORDER — SODIUM CHLORIDE 0.9 % IV SOLN
750.0000 mg/m2 | Freq: Once | INTRAVENOUS | Status: AC
  Administered 2021-07-26: 1292 mg via INTRAVENOUS
  Filled 2021-07-26: qty 33.98

## 2021-07-26 MED ORDER — SODIUM CHLORIDE 0.9% FLUSH
10.0000 mL | INTRAVENOUS | Status: DC | PRN
  Administered 2021-07-26: 10 mL

## 2021-07-26 MED ORDER — SODIUM CHLORIDE 0.9 % IV SOLN: Freq: Once | INTRAVENOUS | Status: AC

## 2021-07-26 MED ORDER — HEPARIN SOD (PORK) LOCK FLUSH 100 UNIT/ML IV SOLN
500.0000 [IU] | Freq: Once | INTRAVENOUS | Status: AC | PRN
  Administered 2021-07-26: 500 [IU]

## 2021-07-26 MED ORDER — PALONOSETRON HCL INJECTION 0.25 MG/5ML
0.2500 mg | Freq: Once | INTRAVENOUS | Status: AC
  Administered 2021-07-26: 0.25 mg via INTRAVENOUS
  Filled 2021-07-26: qty 5

## 2021-07-26 MED ORDER — LEVOTHYROXINE SODIUM 88 MCG PO TABS: ORAL_TABLET | ORAL | 2 refills | Status: DC

## 2021-07-26 NOTE — Progress Notes (Signed)
? ?[facility] ?[address] ?[place],  [address] ? ? ?CLINIC:  ?Medical Oncology/Hematology ? ?PCP:  ?Lemmie Evens, MD ?[address] [place] [address] ?[phone] ? ? 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 ?PRIOR THERAPY: Carboplatin, paclitaxel and Keytruda x 6 cycles from 05/03/2018 to 08/21/2018 ? ?NGS Results: Foundation 1 MS--stable ? ?CURRENT THERAPY: Gemcitabine D1,8 q21d ? ?BRIEF ONCOLOGIC HISTORY:  ?Oncology History  ?Oropharyngeal carcinoma ([place])  ?07/27/2014 Imaging  ? CT neck- Advanced stage oropharyngeal cancer with necrotic adenopathy accounting for the left neck swelling. ? ?  ?07/28/2014 Initial Diagnosis  ? Oropharyngeal cancer ? ?  ?08/03/2014 Imaging  ? CT CAP- L supraclavicular lymphadenopathy is not completely visualized. This is better seen on the previous neck CT from 07/27/2014. Otherwise, no evidence for metastatic disease in the chest, abdomen, or pelvis. ? ?  ?08/03/2014 Imaging  ? Bone scan- Uptake at adjacent anterior LEFT 6, 7, 8 ribs likely representing trauma/fractures. Questionable nonspecific increased tracer localization at the posterior RIGHT 8th and 9th ribs, the adjacent nature which raises a a question of trauma as well ? ?  ?08/06/2014 Pathology Results  ? Dr. Benjamine Mola- Oropharynx, biopsy, Left - INVASIVE SQUAMOUS CELL CARCINOMA. ? ?  ?08/12/2014 Procedure  ? Dr. Enrique Sack- 1. Multiple extraction of tooth numbers 6, 17, 22, 23, 24, 25, 26, and 27. 3 Quadrants of alveoloplasty ? ?  ?08/17/2014 Pathology Results  ? PORT and G-TUBE placed by Dr. Carlis Stable. ? ?  ?08/26/2014 PET scan  ? Large hypermetabolic mass in the left base of tongue. Activity extends across midline to the right base tongue. 2. Intensely hypermetabolic left cervical metastatic lymph nodes. Lymph nodes extend from the left level II position to the left supraclavi ? ?  ?09/01/2014 - 09/22/2014 Chemotherapy  ? Concurrent chemoradiation with Cisplatin 100 mg/m2 x 2 cycles  with Neulasta support. Held cycle #3 d/t renal toxicity.  ? ?  ?09/03/2014 - 10/23/2014 Radiation Therapy  ? Treated in [place], IMRT Isidore Moos).  Base of tongue and bilat neck. Total dose: 70 Gy in 35 fractions. (of note, he did miss several treatments requiring BID dosing towards the end of treatment).  ? ?  ?01/25/2015 PET scan  ? Near complete resolution of metabolic activity at the base of tongue. Minimal residual activity is likely post treatment effect. 2. Complete resolution of metabolic activity above LEFT cervical lymph nodes. No evidence of residual metabolically active  ? ?  ?07/17/2016 Procedure  ? Port-a-cath removed [name])  ? ?  ?05/03/2018 - 08/23/2018 Chemotherapy  ? The patient had dexamethasone (DECADRON) 4 MG tablet, 8 mg, Oral, Daily, 1 of 1 cycle, Start date: 05/01/2018, End date: 10/23/2018 ?palonosetron (ALOXI) injection 0.25 mg, 0.25 mg, Intravenous,  Once, 6 of 6 cycles ?Administration: 0.25 mg (05/03/2018), 0.25 mg (05/24/2018), 0.25 mg (06/14/2018), 0.25 mg (07/09/2018), 0.25 mg (07/30/2018), 0.25 mg (08/21/2018) ?pegfilgrastim-cbqv (UDENYCA) injection 6 mg, 6 mg, Subcutaneous, Once, 5 of 5 cycles ?Administration: 6 mg (05/27/2018), 6 mg (06/17/2018), 6 mg (07/11/2018), 6 mg (08/01/2018), 6 mg (08/23/2018) ?CARBOplatin (PARAPLATIN) 380 mg in sodium chloride 0.9 % 250 mL chemo infusion, 380 mg (100 % of original dose 381 mg), Intravenous,  Once, 6 of 6 cycles ?Dose modification:   (original dose 381 mg, Cycle 1),   (original dose 309.5 mg, Cycle 2), 307.5 mg (original dose 309.5 mg, Cycle 5) ?Administration: 380 mg (05/03/2018), 310 mg (05/24/2018), 310 mg (06/14/2018), 340 mg (07/09/2018), 310 mg (07/30/2018),  350 mg (08/21/2018) ?PACLitaxel (TAXOL) 330 mg in sodium chloride 0.9 % 500 mL chemo infusion (> 80mg /m2), 175 mg/m2 = 330 mg (100 % of original dose 175 mg/m2), Intravenous,  Once, 6 of 6 cycles ?Dose modification: 175 mg/m2 (original dose 175 mg/m2, Cycle 1, Reason: Patient Age) ?Administration: 330 mg (05/03/2018),  330 mg (05/24/2018), 330 mg (06/14/2018), 330 mg (07/09/2018), 330 mg (07/30/2018), 330 mg (08/21/2018) ? ? for chemotherapy treatment.  ? ?  ?05/24/2018 - 05/06/2021 Chemotherapy  ? Patient is on Treatment Plan : HEAD/NECK Pembrolizumab/Taxol/Carboplatin Q21D  ? ?  ?  ?07/05/2021 -  Chemotherapy  ? Patient is on Treatment Plan : LUNG Gemcitabine D1,8 q21d  ? ?  ?  ?Squamous cell lung cancer, left ([place])  ?06/14/2018 Initial Diagnosis  ? Squamous cell lung cancer, left ([place]) ? ?  ? ? ?CANCER STAGING: ? Cancer Staging  ?Oropharyngeal carcinoma ([place]) ?Staging form: Pharynx - Oropharynx, AJCC 7th Edition ?- Clinical: Stage IVA (T4a, N2b, M0) - Unsigned ? ? ?INTERVAL HISTORY:  ?Alexander [name], a 71 y.o. male, returns for routine follow-up and consideration for next cycle of chemotherapy. [name] was last seen on 06/29/2021. ? ?Due for cycle #2 of Gemcitabine today.  ? ?Overall, he tells me he has been feeling pretty well. He continues to drink 2 Ensure daily. His weight is stable. He denies ankle swellings.  ? ?Overall, he feels ready for next cycle of chemo today.  ? ? ?REVIEW OF SYSTEMS:  ?Review of Systems  ?Constitutional:  Negative for appetite change, fatigue and unexpected weight change.  ?Cardiovascular:  Negative for leg swelling.  ?All other systems reviewed and are negative. ? ?PAST MEDICAL/SURGICAL HISTORY:  ?Past Medical History:  ?Diagnosis Date  ? GERD (gastroesophageal reflux disease)   ? Mass of neck   ? 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 dx. 3 weeks ago.- Dr. Oneal Deputy center [place], [place].  ? 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 SCCa of Left BOT  ? ?Past Surgical History:  ?Procedure Laterality Date  ? BIOPSY  01/15/2018  ? Procedure: BIOPSY;  Surgeon: Danie Binder, MD;  [facility];  Service: Endoscopy;;  gastric  ? COLONOSCOPY N/A 03/13/2016  ? Procedure: COLONOSCOPY;  Surgeon: Danie Binder, MD;  [facility];  Service: Endoscopy;  Laterality: N/A;  2:15 PM  ? CYSTOSCOPY WITH INSERTION OF UROLIFT N/A 06/27/2021  ? Procedure: CYSTOSCOPY WITH INSERTION OF UROLIFT;  Surgeon: Cleon Gustin, MD;  [facility];  Service: Urology;  Laterality: N/A;  ? ESOPHAGOGASTRODUODENOSCOPY (EGD) WITH PROPOFOL N/A 08/17/2014  ? Procedure: ESOPHAGOGASTRODUODENOSCOPY (EGD) WITH PROPOFOL (procedure #1);  Surgeon: Aviva Signs Md, MD;  [facility];  Service: General;  Laterality: N/A;  ? ESOPHAGOGASTRODUODENOSCOPY (EGD) WITH PROPOFOL N/A 01/15/2018  ? Procedure: ESOPHAGOGASTRODUODENOSCOPY (EGD) WITH PROPOFOL;  Surgeon: Danie Binder, MD;  [facility];  Service: Endoscopy;  Laterality: N/A;  9:30am  ? MULTIPLE EXTRACTIONS WITH ALVEOLOPLASTY N/A 08/12/2014  ? Procedure: Extraction of tooth #'s 6,17,22,23,24,25,26,27 with alveoloplasty;  Surgeon: Lenn Cal, DDS;  [facility];  Service: Oral Surgery;  Laterality: N/A;  ? PANENDOSCOPY N/A 08/06/2014  ? Procedure: PANENDOSCOPY WITH BIOPSY;  Surgeon: Leta Baptist, MD;  Location: [facility];  Service: ENT;  Laterality: N/A;  ? PEG PLACEMENT Left 08/17/14  ? PEG PLACEMENT N/A 08/17/2014  ? Procedure: PERCUTANEOUS ENDOSCOPIC GASTROSTOMY (PEG) PLACEMENT (  procedure #1);  Surgeon: Aviva Signs Md, MD;  [facility];  Service: General;  Laterality: N/A;  ? PORT-A-CATH REMOVAL Right 07/17/2016  ? Procedure: MINOR REMOVAL PORT-A-CATH;  Surgeon: Aviva Signs, MD;  [facility];  Service: General;  Laterality: Right;  ? PORTACATH PLACEMENT Right 08/17/14  ? PORTACATH PLACEMENT Right 08/17/2014  ? Procedure: INSERTION PORT-A-CATH (procedure #2);  Surgeon: Aviva Signs Md, MD;  [facility];  Service: General;  Laterality: Right;  ? PORTACATH PLACEMENT Left 04/26/2018  ? Procedure: INSERTION PORT-A-CATH (attached catheter in left subclavian);  Surgeon: Aviva Signs, MD;  [facility];  Service: General;  Laterality:  Left;  ? SAVORY DILATION N/A 01/15/2018  ? Procedure: SAVORY DILATION;  Surgeon: Danie Binder, MD;  [facility];  Service: Endoscopy;  Laterality: N/A;  ? VIDEO BRONCHOSCOPY WITH ENDOBRONCHIAL

## 2021-07-26 NOTE — Patient Instructions (Signed)
[place]  Discharge Instructions: ?Thank you for choosing [facility] to provide your oncology and hematology care.  ?If you have a lab appointment with the [facility], please come in thru the Main Entrance and check in at the main information desk. ? ?Wear comfortable clothing and clothing appropriate for easy access to any Portacath or PICC line.  ? ?We strive to give you quality time with your provider. You may need to reschedule your appointment if you arrive late (15 or more minutes).  Arriving late affects you and other patients whose appointments are after yours.  Also, if you miss three or more appointments without notifying the office, you may be dismissed from the clinic at the provider?s discretion.    ?  ?For prescription refill requests, have your pharmacy contact our office and allow 72 hours for refills to be completed.   ? ?Today you received the following chemotherapy and/or immunotherapy agents Gemzar.     ?  ?To help prevent nausea and vomiting after your treatment, we encourage you to take your nausea medication as directed. ? ?BELOW ARE SYMPTOMS THAT SHOULD BE REPORTED IMMEDIATELY: ?*FEVER GREATER THAN 100.4 F (38 ?C) OR HIGHER ?*CHILLS OR SWEATING ?*NAUSEA AND VOMITING THAT IS NOT CONTROLLED WITH YOUR NAUSEA MEDICATION ?*UNUSUAL SHORTNESS OF BREATH ?*UNUSUAL BRUISING OR BLEEDING ?*URINARY PROBLEMS (pain or burning when urinating, or frequent urination) ?*BOWEL PROBLEMS (unusual diarrhea, constipation, pain near the anus) ?TENDERNESS IN MOUTH AND THROAT WITH OR WITHOUT PRESENCE OF ULCERS (sore throat, sores in mouth, or a toothache) ?UNUSUAL RASH, SWELLING OR PAIN  ?UNUSUAL VAGINAL DISCHARGE OR ITCHING  ? ?Items with * indicate a potential emergency and should be followed up as soon as possible or go to the Emergency Department if any problems should occur. ? ?Please show the CHEMOTHERAPY ALERT CARD or IMMUNOTHERAPY ALERT CARD at check-in to the Emergency  Department and triage nurse. ? ?Should you have questions after your visit or need to cancel or reschedule your appointment, please contact [facility] [phone]  and follow the prompts.  Office hours are 8:00 a.m. to 4:30 p.m. Monday - Friday. Please note that voicemails left after 4:00 p.m. may not be returned until the following business day.  We are closed weekends and major holidays. You have access to a nurse at all times for urgent questions. Please call the main number to the clinic [phone] and follow the prompts. ? ?For any non-urgent questions, you may also contact your provider using MyChart. We now offer e-Visits for anyone 24 and older to request care online for non-urgent symptoms. For details visit [email or web address]. ?  ?Also download the MyChart app! Go to the app store, search "MyChart", open the app, select [facility], and log in with your MyChart username and password. ? ?Due to Covid, a mask is required upon entering the hospital/clinic. If you do not have a mask, one will be given to you upon arrival. For doctor visits, patients may have 1 support person aged 25 or older with them. For treatment visits, patients cannot have anyone with them due to current Covid guidelines and our immunocompromised population.  ?

## 2021-07-26 NOTE — Patient Instructions (Signed)
[place] at [facility] ?Discharge Instructions ? ? ?You were seen and examined today by Dr. Delton Coombes. ? ?He reviewed your lab work which shows elevated kidney function. We will give you extra fluid today. Your white blood cell count is also low. We dose reduced your chemotherapy today for that reason. ? ?Return as scheduled.  ? ? ?Thank you for choosing [place] at [facility] to provide your oncology and hematology care.  To afford each patient quality time with our provider, please arrive at least 15 minutes before your scheduled appointment time.  ? ?If you have a lab appointment with the [facility] please come in thru the Main Entrance and check in at the main information desk. ? ?You need to re-schedule your appointment should you arrive 10 or more minutes late.  We strive to give you quality time with our providers, and arriving late affects you and other patients whose appointments are after yours.  Also, if you no show three or more times for appointments you may be dismissed from the clinic at the providers discretion.     ?Again, thank you for choosing [facility].  Our hope is that these requests will decrease the amount of time that you wait before being seen by our physicians.       ?_____________________________________________________________ ? ?Should you have questions after your visit to [facility], please contact our office at [phone] and follow the prompts.  Our office hours are 8:00 a.m. and 4:30 p.m. Monday - Friday.  Please note that voicemails left after 4:00 p.m. may not be returned until the following business day.  We are closed weekends and major holidays.  You do have access to a nurse 24-7, just call the main number to the clinic [phone] and do not press any options, hold on the line and a nurse will answer the phone.   ? ?For prescription refill requests, have your pharmacy contact our office  and allow 72 hours.   ? ?Due to Covid, you will need to wear a mask upon entering the hospital. If you do not have a mask, a mask will be given to you at the Main Entrance upon arrival. For doctor visits, patients may have 1 support person age 63 or older with them. For treatment visits, patients can not have anyone with them due to social distancing guidelines and our immunocompromised population.  ? ?   ?

## 2021-07-26 NOTE — Progress Notes (Signed)
Patient has been examined by Dr. Delton Coombes, and vital signs and labs have been reviewed. ANC, Creatinine, LFTs, hemoglobin, and platelets are within treatment parameters per M.D. - pt may proceed with treatment.   Gemzar dose reduced by 25%.  ?

## 2021-07-26 NOTE — Progress Notes (Signed)
Patient presents today for treatment and follow up visit with Dr. Delton Coombes. Labs pending. Vital signs within parameters for treatment. Patient has no complaints today. Consent 07/05/2021 and attestation 06/29/2021.  ? ?Creatinine 2.14. ANC 1.0.  ? ?Treatment given today per MD orders. Tolerated infusion without adverse affects. Vital signs stable. No complaints at this time. Discharged from clinic ambulatory in stable condition. Alert and oriented x 3. F/U with [facility] as scheduled.   ?

## 2021-08-01 ENCOUNTER — Ambulatory Visit: Payer: Medicare Other | Admitting: Physician Assistant

## 2021-08-02 ENCOUNTER — Ambulatory Visit (INDEPENDENT_AMBULATORY_CARE_PROVIDER_SITE_OTHER): Payer: Medicare Other | Admitting: Urology

## 2021-08-02 ENCOUNTER — Inpatient Hospital Stay (HOSPITAL_COMMUNITY): Payer: Medicare Other

## 2021-08-02 ENCOUNTER — Emergency Department (HOSPITAL_COMMUNITY)
Admission: EM | Admit: 2021-08-02 | Discharge: 2021-08-02 | Discharge status: Against Medical Advice | Payer: Medicare Other | Source: Home / Self Care

## 2021-08-02 ENCOUNTER — Other Ambulatory Visit: Payer: Self-pay | Admitting: *Deleted

## 2021-08-02 VITALS — BP 93/57 | HR 83

## 2021-08-02 DIAGNOSIS — N138 Other obstructive and reflux uropathy: Secondary | ICD-10-CM

## 2021-08-02 DIAGNOSIS — E039 Hypothyroidism, unspecified: Secondary | ICD-10-CM

## 2021-08-02 DIAGNOSIS — N401 Enlarged prostate with lower urinary tract symptoms: Secondary | ICD-10-CM

## 2021-08-02 DIAGNOSIS — C3492 Malignant neoplasm of unspecified part of left bronchus or lung: Secondary | ICD-10-CM

## 2021-08-02 DIAGNOSIS — R339 Retention of urine, unspecified: Secondary | ICD-10-CM

## 2021-08-02 DIAGNOSIS — Z5111 Encounter for antineoplastic chemotherapy: Secondary | ICD-10-CM | POA: Diagnosis not present

## 2021-08-02 LAB — CBC WITH DIFFERENTIAL/PLATELET
Abs Immature Granulocytes: 0.01 10*3/uL (ref 0.00–0.07)
Basophils Absolute: 0 10*3/uL (ref 0.0–0.1)
Basophils Relative: 1 %
Eosinophils Absolute: 0 10*3/uL (ref 0.0–0.5)
Eosinophils Relative: 1 %
HCT: 29.3 % — ABNORMAL LOW (ref 39.0–52.0)
Hemoglobin: 9.6 g/dL — ABNORMAL LOW (ref 13.0–17.0)
Immature Granulocytes: 1 %
Lymphocytes Relative: 38 %
Lymphs Abs: 0.7 10*3/uL (ref 0.7–4.0)
MCH: 31 pg (ref 26.0–34.0)
MCHC: 32.8 g/dL (ref 30.0–36.0)
MCV: 94.5 fL (ref 80.0–100.0)
Monocytes Absolute: 0.2 10*3/uL (ref 0.1–1.0)
Monocytes Relative: 10 %
Neutro Abs: 0.9 10*3/uL — ABNORMAL LOW (ref 1.7–7.7)
Neutrophils Relative %: 49 %
Platelets: 357 10*3/uL (ref 150–400)
RBC: 3.1 MIL/uL — ABNORMAL LOW (ref 4.22–5.81)
RDW: 15.7 % — ABNORMAL HIGH (ref 11.5–15.5)
WBC: 1.8 10*3/uL — ABNORMAL LOW (ref 4.0–10.5)
nRBC: 0 % (ref 0.0–0.2)

## 2021-08-02 LAB — COMPREHENSIVE METABOLIC PANEL
ALT: 18 U/L (ref 0–44)
AST: 23 U/L (ref 15–41)
Albumin: 3.9 g/dL (ref 3.5–5.0)
Alkaline Phosphatase: 57 U/L (ref 38–126)
Anion gap: 9 (ref 5–15)
BUN: 32 mg/dL — ABNORMAL HIGH (ref 8–23)
CO2: 23 mmol/L (ref 22–32)
Calcium: 9.1 mg/dL (ref 8.9–10.3)
Chloride: 106 mmol/L (ref 98–111)
Creatinine, Ser: 2.26 mg/dL — ABNORMAL HIGH (ref 0.61–1.24)
GFR, Estimated: 30 mL/min — ABNORMAL LOW (ref >60)
Glucose, Bld: 96 mg/dL (ref 70–99)
Potassium: 4.4 mmol/L (ref 3.5–5.1)
Sodium: 138 mmol/L (ref 135–145)
Total Bilirubin: 0.5 mg/dL (ref 0.3–1.2)
Total Protein: 7.6 g/dL (ref 6.5–8.1)

## 2021-08-02 LAB — MAGNESIUM: Magnesium: 2 mg/dL (ref 1.7–2.4)

## 2021-08-02 LAB — TSH: TSH: 2.663 u[IU]/mL (ref 0.350–4.500)

## 2021-08-02 MED ORDER — LEVOTHYROXINE SODIUM 88 MCG PO TABS: ORAL_TABLET | ORAL | 2 refills | Status: DC

## 2021-08-02 MED ORDER — HYDROCODONE-ACETAMINOPHEN 10-325 MG PO TABS: 1.0000 | ORAL_TABLET | Freq: Two times a day (BID) | ORAL | 0 refills | Status: DC | PRN

## 2021-08-02 NOTE — Progress Notes (Signed)
? ?08/02/2021 ?9:10 AM  ? ?Alexander [name] ?06-17-1950 ?[id number] ? ?Referring provider: Lemmie Evens, MD ?[address] ?[place],  [place] [address] ? ?Followup urinary retention ? ? ?HPI: ?Alexander [name] is a 71yo here for followup for BPh with urinary retention. He underwent Urolift 4/3 and has been unable to pass a voiding trial since surgery. He was restarted on rapaflo 8mg  and he is requesting the foley be removed today. No other complaints.  ? ? ?PMH: ?Past Medical History:  ?Diagnosis Date  ? GERD (gastroesophageal reflux disease)   ? Mass of neck   ? 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 dx. 3 weeks ago.- Dr. Oneal Deputy center [place], [place].  ? 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 SCCa of Left BOT  ? ? ?Surgical History: ?Past Surgical History:  ?Procedure Laterality Date  ? BIOPSY  01/15/2018  ? Procedure: BIOPSY;  Surgeon: Danie Binder, MD;  [facility];  Service: Endoscopy;;  gastric  ? COLONOSCOPY N/A 03/13/2016  ? Procedure: COLONOSCOPY;  Surgeon: Danie Binder, MD;  [facility];  Service: Endoscopy;  Laterality: N/A;  2:15 PM  ? CYSTOSCOPY WITH INSERTION OF UROLIFT N/A 06/27/2021  ? Procedure: CYSTOSCOPY WITH INSERTION OF UROLIFT;  Surgeon: Cleon Gustin, MD;  Location: [facility];  Service: Urology;  Laterality: N/A;  ? ESOPHAGOGASTRODUODENOSCOPY (EGD) WITH PROPOFOL N/A 08/17/2014  ? Procedure: ESOPHAGOGASTRODUODENOSCOPY (EGD) WITH PROPOFOL (procedure #1);  Surgeon: Aviva Signs Md, MD;  [facility];  Service: General;  Laterality: N/A;  ? ESOPHAGOGASTRODUODENOSCOPY (EGD) WITH PROPOFOL N/A 01/15/2018  ? Procedure: ESOPHAGOGASTRODUODENOSCOPY (EGD) WITH PROPOFOL;  Surgeon: Danie Binder, MD;  [facility];  Service: Endoscopy;  Laterality: N/A;  9:30am  ? MULTIPLE EXTRACTIONS WITH ALVEOLOPLASTY N/A 08/12/2014  ? Procedure: Extraction of tooth #'s  6,17,22,23,24,25,26,27 with alveoloplasty;  Surgeon: Lenn Cal, DDS;  [facility];  Service: Oral Surgery;  Laterality: N/A;  ? PANENDOSCOPY N/A 08/06/2014  ? Procedure: PANENDOSCOPY WITH BIOPSY;  Surgeon: Leta Baptist, MD;  Location: [facility];  Service: ENT;  Laterality: N/A;  ? PEG PLACEMENT Left 08/17/14  ? PEG PLACEMENT N/A 08/17/2014  ? Procedure: PERCUTANEOUS ENDOSCOPIC GASTROSTOMY (PEG) PLACEMENT (procedure #1);  Surgeon: Aviva Signs Md, MD;  Location: [facility];  Service: General;  Laterality: N/A;  ? PORT-A-CATH REMOVAL Right 07/17/2016  ? Procedure: MINOR REMOVAL PORT-A-CATH;  Surgeon: Aviva Signs, MD;  [facility]: [facility];  Service: General;  Laterality: Right;  ? PORTACATH PLACEMENT Right 08/17/14  ? PORTACATH PLACEMENT Right 08/17/2014  ? Procedure: INSERTION PORT-A-CATH (procedure #2);  Surgeon: Aviva Signs Md, MD;  [facility];  Service: General;  Laterality: Right;  ? PORTACATH PLACEMENT Left 04/26/2018  ? Procedure: INSERTION PORT-A-CATH (attached catheter in left subclavian);  Surgeon: Aviva Signs, MD;  [facility];  Service: General;  Laterality: Left;  ? SAVORY DILATION N/A 01/15/2018  ? Procedure: SAVORY DILATION;  Surgeon: Danie Binder, MD;  [facility];  Service: Endoscopy;  Laterality: N/A;  ? VIDEO BRONCHOSCOPY WITH ENDOBRONCHIAL ULTRASOUND N/A 04/15/2018  ? Procedure: VIDEO BRONCHOSCOPY WITH ENDOBRONCHIAL ULTRASOUND;  Surgeon: Melrose Nakayama, MD;  Location: [facility];  Service: Thoracic;  Laterality: N/A;  ? ? ?Home Medications:  ?Allergies as of 08/02/2021   ?No Known Allergies ?  ? ?  ?Medication List  ?  ? ?  ? Accurate as of Aug 02, 2021  9:10 AM. If you have any questions, ask your nurse or doctor.  ?  ?  ? ?  ? ?feeding supplement Liqd ?Take 237 mLs by mouth 4 (four) times daily. ?  ?fluconazole 100 MG tablet ?Commonly known as: DIFLUCAN ?Take 2 tablets (200 mg) by mouth on the first day then 1 tablet (100 mg) daily for the following 4 days ?   ?GEMZAR IV ?Inject into the vein once a week. Days 1 & 8 q 21 days ?  ?HYDROcodone-acetaminophen 10-325 MG tablet ?Commonly known as: NORCO ?Take 1 tablet by mouth every 12 (twelve) hours as needed. ?  ?levothyroxine 88 MCG tablet ?Commonly known as: SYNTHROID ?TAKE ONE TABLET BY MOUTH DAILY BEFORE BREAKFAST ?  ?lidocaine 2 % solution ?Commonly known as: XYLOCAINE ?Use as directed 15 mLs in the mouth or throat as needed for mouth pain. ?  ?Melatonin 10 MG Tabs ?Take 1 tablet by mouth at bedtime. ?  ?naproxen sodium 220 MG tablet ?Commonly known as: ALEVE ?Take 220 mg by mouth daily as needed. ?  ?omeprazole 20 MG capsule ?Commonly known as: PRILOSEC ?TAKE 1 CAPSULE BY MOUTH 30 MINUTES PRIOR TO BREAKFAST ?  ?tadalafil 20 MG tablet ?Commonly known as: CIALIS ?Take 1 tablet (20 mg total) by mouth daily as needed. ?  ?tamsulosin 0.4 MG Caps capsule ?Commonly known as: FLOMAX ?Take 1 capsule (0.4 mg total) by mouth 2 (two) times daily. ?  ? ?  ? ? ?Allergies: No Known Allergies ? ?Family History: ?Family History  ?Problem Relation Age of Onset  ? Colon cancer Neg Hx   ? Gastric cancer Neg Hx   ? Esophageal cancer Neg Hx   ? ? ?Social History:  reports that he quit smoking about 7 years ago. His smoking use included cigarettes. He has a 15.00 pack-year smoking history. He has never used smokeless tobacco. He reports that he does not currently use alcohol. He reports that he does not use drugs. ? ?ROS: ?All other review of systems were reviewed and are negative except what is noted above in HPI ? ?Physical Exam: ?BP (!) 93/57   Pulse 83   ?Constitutional:  Alert and oriented, No acute distress. ?HEENT: [place] AT, moist mucus membranes.  Trachea midline, no masses. ?Cardiovascular: No clubbing, cyanosis, or edema. ?Respiratory: Normal respiratory effort, no increased work of breathing. ?GI: Abdomen is soft, nontender, nondistended, no abdominal masses ?GU: No CVA tenderness.  ?Lymph: No cervical or inguinal  lymphadenopathy. ?Skin: No rashes, bruises or suspicious lesions. ?Neurologic: Grossly intact, no focal deficits, moving all 4 extremities. ?Psychiatric: Normal mood and affect. ? ?Laboratory Data: ?Lab Results  ?Component Value Date  ? WBC 2.2 (L) 07/26/2021  ? HGB 9.7 (L) 07/26/2021  ? HCT 29.5 (L) 07/26/2021  ? MCV 95.2 07/26/2021  ? PLT 226 07/26/2021  ? ? ?Lab Results  ?Component Value Date  ? CREATININE 2.14 (H) 07/26/2021  ? ? ?No results found for: PSA ? ?No results found for: TESTOSTERONE ? ?No results found for: HGBA1C ? ?Urinalysis ?   ?Component Value Date/Time  ? [place] YELLOW 05/05/2021 1841  ? APPEARANCEUR CLEAR 05/05/2021 1841  ? APPEARANCEUR Clear 12/15/2020 1008  ? LABSPEC 1.015 05/05/2021 1841  ? PHURINE 5.0 05/05/2021 1841  ? [place] NEGATIVE 05/05/2021 1841  ? [place] NEGATIVE 05/05/2021 1841  ? [place] NEGATIVE 05/05/2021 1841  ? BILIRUBINUR Negative 12/15/2020 1008  ? [place] NEGATIVE 05/05/2021 1841  ? [place] NEGATIVE 05/05/2021 1841  ? NITRITE NEGATIVE 05/05/2021 1841  ?  LEUKOCYTESUR NEGATIVE 05/05/2021 1841  ? ? ?Lab Results  ?Component Value Date  ? LABMICR See below: 12/15/2020  ? WBCUA 0-5 12/15/2020  ? LABEPIT 0-10 12/15/2020  ? BACTERIA None seen 12/15/2020  ? ? ?Pertinent Imaging: ? ?No results found for this or any previous visit. ? ?No results found for this or any previous visit. ? ?No results found for this or any previous visit. ? ?No results found for this or any previous visit. ? ?No results found for this or any previous visit. ? ?No results found for this or any previous visit. ? ?No results found for this or any previous visit. ? ?No results found for this or any previous visit. ? ? ?Assessment & Plan:   ? ?1. Urinary retention ?We discussed the management of his BPH including continued medical therapy, Rezum, Urolift, TURP and simple prostatectomy. After discussing the options the patient has elected to proceed with [facility] T[facility]. Risks/benefits/alternatives  discussed.  ? ?2. Benign prostatic hyperplasia with urinary obstruction ?We will schedule for channel TURP ? ? ?No follow-ups on file. ? ?[name], MD ? ?[facility] ?  ?

## 2021-08-02 NOTE — Progress Notes (Signed)
Pt came in for treatment today.  He went to the urologist today before his infusion appt today and they removed his catheter.  While waiting on his blood to result, he started c/o bladder and groin pain.  ? ?Pt was taken to the ER during the time pt was waiting to be triaged by the ER. Pt called the urologist and she stated for him to come back to the office and she would put the catheter back in. He called a ride and they took him back to the urologist office to have the catheter put back in.  ? ?Pt's  blood work resulted and his [place] is 0.9, Creatinine is 2.26, and WBC is 1.8. R.Pennington-PA/Dr.Shadad made aware and treatment will be held this week per provider. Pt will follow-up with Dr.K next week. ?

## 2021-08-02 NOTE — H&P (View-Only) (Signed)
08/02/2021 9:10 AM   Eduard Clos Worthy [place]ank [birth year] [id number]  Referring provider: Lemmie Evens, MD [place],  [place] [address]  Followup urinary retention   HPI: [name] is a 71yo here for followup for BPh with urinary retention. He underwent Urolift 4/3 and has been unable to pass a voiding trial since surgery. He was restarted on rapaflo 8mg  and he is requesting the foley be removed today. No other complaints.    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 3 weeks ago.- Dr. Oneal Deputy center [place], [place].   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   SCCa of Left BOT    Surgical History: Past Surgical History:  Procedure Laterality Date   BIOPSY  01/15/2018   Procedure: BIOPSY;  Surgeon: Danie Binder, MD;  [facility];  Service: Endoscopy;;  gastric   COLONOSCOPY N/A 03/13/2016   Procedure: COLONOSCOPY;  Surgeon: Danie Binder, MD;  [facility];  Service: Endoscopy;  Laterality: N/A;  2:15 PM   CYSTOSCOPY WITH INSERTION OF UROLIFT N/A 06/27/2021   Procedure: CYSTOSCOPY WITH INSERTION OF UROLIFT;  Surgeon: Cleon Gustin, MD;  Location: [facility];  Service: Urology;  Laterality: N/A;   ESOPHAGOGASTRODUODENOSCOPY (EGD) WITH PROPOFOL N/A 08/17/2014   Procedure: ESOPHAGOGASTRODUODENOSCOPY (EGD) WITH PROPOFOL (procedure #1);  Surgeon: Aviva Signs Md, MD;  Location: [facility];  Service: General;  Laterality: N/A;   ESOPHAGOGASTRODUODENOSCOPY (EGD) WITH PROPOFOL N/A 01/15/2018   Procedure: ESOPHAGOGASTRODUODENOSCOPY (EGD) WITH PROPOFOL;  Surgeon: Danie Binder, MD;  [facility];  Service: Endoscopy;  Laterality: N/A;  9:30am   MULTIPLE EXTRACTIONS WITH ALVEOLOPLASTY N/A 08/12/2014   Procedure: Extraction of tooth #'s  6,17,22,23,24,25,26,27 with alveoloplasty;  Surgeon: Lenn Cal, DDS;  [facility];  Service: Oral Surgery;  Laterality: N/A;   PANENDOSCOPY N/A 08/06/2014   Procedure: PANENDOSCOPY WITH BIOPSY;  Surgeon: Leta Baptist, MD;  Location: [facility];  Service: ENT;  Laterality: N/A;   PEG PLACEMENT Left 08/17/14   PEG PLACEMENT N/A 08/17/2014   Procedure: PERCUTANEOUS ENDOSCOPIC GASTROSTOMY (PEG) PLACEMENT (procedure #1);  Surgeon: Aviva Signs Md, MD;  [facility];  Service: General;  Laterality: N/A;   PORT-A-CATH REMOVAL Right 07/17/2016   Procedure: MINOR REMOVAL PORT-A-CATH;  Surgeon: Aviva Signs, MD;  [facility];  Service: General;  Laterality: Right;   PORTACATH PLACEMENT Right 08/17/14   PORTACATH PLACEMENT Right 08/17/2014   Procedure: INSERTION PORT-A-CATH (procedure #2);  Surgeon: Aviva Signs Md, MD;  [facility];  Service: General;  Laterality: Right;   PORTACATH PLACEMENT Left 04/26/2018   Procedure: INSERTION PORT-A-CATH (attached catheter in left subclavian);  Surgeon: Aviva Signs, MD;  [facility];  Service: General;  Laterality: Left;   SAVORY DILATION N/A 01/15/2018   Procedure: SAVORY DILATION;  Surgeon: Danie Binder, MD;  [facility];  Service: Endoscopy;  Laterality: N/A;   VIDEO BRONCHOSCOPY WITH ENDOBRONCHIAL ULTRASOUND N/A 04/15/2018   Procedure: VIDEO BRONCHOSCOPY WITH ENDOBRONCHIAL ULTRASOUND;  Surgeon: Melrose Nakayama, MD;  [facility];  Service: Thoracic;  Laterality: N/A;    Home Medications:  Allergies as of 08/02/2021   No Known Allergies      Medication List        Accurate as of Aug 02, 2021  9:10 AM. If you have any questions, ask your nurse or doctor.          feeding supplement Liqd Take 237 mLs by mouth 4 (four) times daily.   fluconazole 100 MG tablet Commonly known as: DIFLUCAN Take 2 tablets (200 mg) by mouth on the first day then 1 tablet (100 mg) daily for the following 4 days    GEMZAR IV Inject into the vein once a week. Days 1 & 8 q 21 days   HYDROcodone-acetaminophen 10-325 MG tablet Commonly known as: NORCO Take 1 tablet by mouth every 12 (twelve) hours as needed.   levothyroxine 88 MCG tablet Commonly known as: SYNTHROID TAKE ONE TABLET BY MOUTH DAILY BEFORE BREAKFAST   lidocaine 2 % solution Commonly known as: XYLOCAINE Use as directed 15 mLs in the mouth or throat as needed for mouth pain.   Melatonin 10 MG Tabs Take 1 tablet by mouth at bedtime.   naproxen sodium 220 MG tablet Commonly known as: ALEVE Take 220 mg by mouth daily as needed.   omeprazole 20 MG capsule Commonly known as: PRILOSEC TAKE 1 CAPSULE BY MOUTH 30 MINUTES PRIOR TO BREAKFAST   tadalafil 20 MG tablet Commonly known as: CIALIS Take 1 tablet (20 mg total) by mouth daily as needed.   tamsulosin 0.4 MG Caps capsule Commonly known as: FLOMAX Take 1 capsule (0.4 mg total) by mouth 2 (two) times daily.        Allergies: No Known Allergies  Family History: Family History  Problem Relation Age of Onset   Colon cancer Neg Hx    Gastric cancer Neg Hx    Esophageal cancer Neg Hx     Social History:  reports that he quit smoking about 7 years ago. His smoking use included cigarettes. He has a 15.00 pack-year smoking history. He has never used smokeless tobacco. He reports that he does not currently use alcohol. He reports that he does not use drugs.  ROS: All other review of systems were reviewed and are negative except what is noted above in HPI  Physical Exam: BP (!) 93/57   Pulse 83   Constitutional:  Alert and oriented, No acute distress. HEENT: [place] AT, moist mucus membranes.  Trachea midline, no masses. Cardiovascular: No clubbing, cyanosis, or edema. Respiratory: Normal respiratory effort, no increased work of breathing. GI: Abdomen is soft, nontender, nondistended, no abdominal masses GU: No CVA tenderness.  Lymph: No cervical or inguinal  lymphadenopathy. Skin: No rashes, bruises or suspicious lesions. Neurologic: Grossly intact, no focal deficits, moving all 4 extremities. Psychiatric: Normal mood and affect.  Laboratory Data: Lab Results  Component Value Date   WBC 2.2 (L) 07/26/2021   HGB 9.7 (L) 07/26/2021   HCT 29.5 (L) 07/26/2021   MCV 95.2 07/26/2021   PLT 226 07/26/2021    Lab Results  Component Value Date   CREATININE 2.14 (H) 07/26/2021    No results found for: PSA  No results found for: TESTOSTERONE  No results found for: HGBA1C  Urinalysis    Component Value Date/Time   COLORURINE YELLOW 05/05/2021 1841   APPEARANCEUR CLEAR 05/05/2021 1841   APPEARANCEUR Clear 12/15/2020 1008   LABSPEC 1.015 05/05/2021 1841   PHURINE 5.0 05/05/2021 1841   GLUCOSEU NEGATIVE 05/05/2021 1841   HGBUR NEGATIVE 05/05/2021 1841   BILIRUBINUR NEGATIVE 05/05/2021 1841   BILIRUBINUR Negative 12/15/2020 [place] 05/05/2021 1841   PROTEINUR NEGATIVE 05/05/2021 1841   NITRITE NEGATIVE 05/05/2021 1841  LEUKOCYTESUR NEGATIVE 05/05/2021 1841    Lab Results  Component Value Date   LABMICR See below: 12/15/2020   WBCUA 0-5 12/15/2020   LABEPIT 0-10 12/15/2020   BACTERIA None seen 12/15/2020    Pertinent Imaging:  No results found for this or any previous visit.  No results found for this or any previous visit.  No results found for this or any previous visit.  No results found for this or any previous visit.  No results found for this or any previous visit.  No results found for this or any previous visit.  No results found for this or any previous visit.  No results found for this or any previous visit.   Assessment & Plan:    1. Urinary retention We discussed the management of his BPH including continued medical therapy, Rezum, Urolift, TURP and simple prostatectomy. After discussing the options the patient has elected to proceed with [facility] T[facility]. Risks/benefits/alternatives  discussed.   2. Benign prostatic hyperplasia with urinary obstruction We will schedule for channel TURP   No follow-ups on file.  [name], MD  [facility]

## 2021-08-02 NOTE — Progress Notes (Signed)
Fill and Pull Catheter Removal ? ?Patient is present today for a catheter removal.  Patient was cleaned and prepped in a sterile fashion 232ml of sterile water/ saline was instilled into the bladder when the patient felt the urge to urinate. 61ml of water was then drained from the balloon.  A 16FR foley cath was removed from the bladder no complications were noted .  Patient as then given some time to void on their own.  Patient can void  184ml on their own after some time.  Patient tolerated well. ? ?Performed by: Patient will return this afternoon for PVR ? ?Follow up/ Additional notes: Per Md note ?

## 2021-08-02 NOTE — Progress Notes (Signed)
Simple Catheter Placement ? ?Due to urinary retention patient is present today for a foley cath placement.  Patient was cleaned and prepped in a sterile fashion with betadine. A 16 FR foley catheter was inserted, urine return was noted  338ml, urine was yellow in color.  The balloon was filled with 10cc of sterile water.  A leg bag was attached for drainage. Patient was also given a night bag to take home and was given instruction on how to change from one bag to another.  Patient was given instruction on proper catheter care.  Patient tolerated well, no complications were noted  ? ?Performed by: Levi Aland, CMA ? ?Additional notes/ Follow up: Follow up as scheduled.   ?

## 2021-08-02 NOTE — Progress Notes (Signed)
Patients port flushed without difficulty.  Good blood return noted with no bruising or swelling noted at site.  Stable during access and blood draw.  Patient to remain accessed for treatment. 

## 2021-08-10 ENCOUNTER — Encounter: Payer: Self-pay | Admitting: Urology

## 2021-08-10 NOTE — Patient Instructions (Signed)
Transurethral Resection of the Prostate ?Transurethral resection of the prostate (TURP) is the removal, or resection, of part of the prostate tissue. This procedure is done to treat an enlarged prostate gland (benign prostatic hyperplasia). ?The goal of TURP is to remove enough prostate tissue to allow for a normal flow of urine. The procedure will allow you to empty your bladder more completely when you urinate so that you can urinate less often. ?In a transurethral resection, a thin telescope with a light, a camera, and an electric cutting edge (resectoscope) is passed through the urethra and into the prostate. The opening of the urethra is at the end of the penis. ?Tell a health care provider about: ?Any allergies you have. ?All medicines you are taking, including vitamins, herbs, eye drops, creams, and over-the-counter medicines. ?Any problems you or family members have had with anesthetic medicines. ?Any bleeding problems you have. ?Any surgeries you have had. ?Any medical conditions you have. ?Any prostate infections you have had. ?What are the risks? ?Generally, this is a safe procedure. However, problems may occur, including: ?Infection. ?Bleeding. ?Allergic reactions to medicines. ?Blood in the urine (hematuria). ?Damage to nearby structures or organs. ?Other problems may occur, but they are rare. They include: ?Dry ejaculation, or having no semen come out during orgasm. ?Erectile dysfunction, or being unable to have or keep an erection. ?Scarring that leads to narrowing of the urethra. This narrowing may block the flow of urine. ?Inability to control when you urinate (incontinence). ?Deep vein thrombosis. This is a blood clot that can develop in your leg. ?TURP syndrome. This can happen when you lose too much sodium during or after the procedure. Some signs and symptoms of this condition include: ?Weakness. ?Headaches. ?Nausea or vomiting. ?Muscle cramping. ?What happens before the procedure? ?When to stop  eating and drinking ?Follow instructions from your health care provider about what you may eat and drink before your procedure. These may include: ?8 hours before your procedure ?Stop eating most foods. Do not eat meat, fried foods, or fatty foods. ?Eat only light foods, such as toast or crackers. ?All liquids are okay except energy drinks and alcohol. ?6 hours before your procedure ?Stop eating. ?Drink only clear liquids, such as water, clear fruit juice, black coffee, plain tea, and sports drinks. ?Do not drink energy drinks or alcohol. ?2 hours before your procedure ?Stop drinking all liquids. ?You may be allowed to take medicines with small sips of water. ?If you do not follow your health care provider's instructions, your procedure may be delayed or canceled. ?Medicines ?Ask your health care provider about: ?Changing or stopping your regular medicines. This is especially important if you are taking diabetes medicines or blood thinners. ?Taking medicines such as aspirin and ibuprofen. These medicines can thin your blood. Do not take these medicines unless your health care provider tells you to take them. ?Taking over-the-counter medicines, vitamins, herbs, and supplements. ?Surgery safety ?Ask your health care provider what steps will be taken to help prevent infection. These steps may include: ?Removing hair at the surgery site. ?Washing skin with a germ-killing soap. ?Taking antibiotic medicine. ?General instructions ?Do not use any products that contain nicotine or tobacco for at least 4 weeks before the procedure. These products include cigarettes, chewing tobacco, and vaping devices, such as e-cigarettes. If you need help quitting, ask your health care provider. ?If you will be going home right after the procedure, plan to have a responsible adult: ?Take you home from the hospital or clinic. You  will not be allowed to drive. ?Care for you for the time you are told. ?What happens during the procedure? ? ?An  IV will be inserted into one of your veins. ?You will be given one or more of the following: ?A medicine to help you relax (sedative). ?A medicine to make you fall asleep (general anesthetic). ?A medicine that is injected into your spine to numb the area below and slightly above the injection site (spinal anesthetic). ?Your legs will be placed in foot rests (stirrups) so that your legs are apart and your knees are bent. ?The resectoscope will be passed through your urethra to your prostate. ?Parts of your prostate will be resected using the cutting edge of the resectoscope. ?Fluid will be passed to rinse out the cut tissues (irrigation). ?The resectoscope will be removed. ?A small, thin tube (catheter) will be passed through your urethra and into your bladder. The catheter will drain urine into a bag outside of your body. ?The procedure may vary among health care providers and hospitals. ?What happens after the procedure? ?Your blood pressure, heart rate, breathing rate, and blood oxygen level will be monitored until you leave the hospital or clinic. ?You will be given fluids through the IV. The IV will be removed when you start eating and drinking normally. ?You may have some pain. Pain medicine will be available to help you. ?You will have a catheter draining your urine. ?You may have blood in your urine. Your catheter may be kept in until your urine is clear. ?Your urinary drainage will be monitored. If necessary, your bladder may be rinsed out (irrigated) through your catheter. ?You will be encouraged to walk around as soon as possible. ?You may have to wear compression stockings. These stockings help to prevent blood clots and reduce swelling in your legs. ?If you were given a sedative during the procedure, it can affect you for several hours. Do not drive or operate machinery until your health care provider says that it is safe. ?Summary ?Transurethral resection of the prostate (TURP) is the removal  (resection) of part of the prostate tissue. ?The goal of this procedure is to remove enough prostate tissue to allow for a normal flow of urine. ?Follow instructions from your health care provider about taking medicines and about eating and drinking before the procedure. ?This information is not intended to replace advice given to you by your health care provider. Make sure you discuss any questions you have with your health care provider. ?Document Revised: 12/07/2020 Document Reviewed: 12/07/2020 ?Elsevier Patient Education ? [place] ? ?

## 2021-08-11 ENCOUNTER — Inpatient Hospital Stay (HOSPITAL_COMMUNITY): Payer: Medicare Other

## 2021-08-11 ENCOUNTER — Inpatient Hospital Stay (HOSPITAL_BASED_OUTPATIENT_CLINIC_OR_DEPARTMENT_OTHER): Payer: Medicare Other | Admitting: Hematology

## 2021-08-11 VITALS — BP 124/71 | HR 66 | Temp 97.9°F | Resp 20

## 2021-08-11 DIAGNOSIS — C3492 Malignant neoplasm of unspecified part of left bronchus or lung: Secondary | ICD-10-CM

## 2021-08-11 DIAGNOSIS — C109 Malignant neoplasm of oropharynx, unspecified: Secondary | ICD-10-CM

## 2021-08-11 DIAGNOSIS — E039 Hypothyroidism, unspecified: Secondary | ICD-10-CM | POA: Diagnosis not present

## 2021-08-11 DIAGNOSIS — Z5111 Encounter for antineoplastic chemotherapy: Secondary | ICD-10-CM | POA: Diagnosis not present

## 2021-08-11 LAB — CBC WITH DIFFERENTIAL/PLATELET
Abs Immature Granulocytes: 0.01 10*3/uL (ref 0.00–0.07)
Basophils Absolute: 0 10*3/uL (ref 0.0–0.1)
Basophils Relative: 0 %
Eosinophils Absolute: 0 10*3/uL (ref 0.0–0.5)
Eosinophils Relative: 0 %
HCT: 32.3 % — ABNORMAL LOW (ref 39.0–52.0)
Hemoglobin: 10.7 g/dL — ABNORMAL LOW (ref 13.0–17.0)
Immature Granulocytes: 0 %
Lymphocytes Relative: 28 %
Lymphs Abs: 1.5 10*3/uL (ref 0.7–4.0)
MCH: 31.6 pg (ref 26.0–34.0)
MCHC: 33.1 g/dL (ref 30.0–36.0)
MCV: 95.3 fL (ref 80.0–100.0)
Monocytes Absolute: 1 10*3/uL (ref 0.1–1.0)
Monocytes Relative: 18 %
Neutro Abs: 2.9 10*3/uL (ref 1.7–7.7)
Neutrophils Relative %: 54 %
Platelets: 181 10*3/uL (ref 150–400)
RBC: 3.39 MIL/uL — ABNORMAL LOW (ref 4.22–5.81)
RDW: 17.8 % — ABNORMAL HIGH (ref 11.5–15.5)
WBC: 5.5 10*3/uL (ref 4.0–10.5)
nRBC: 0 % (ref 0.0–0.2)

## 2021-08-11 LAB — COMPREHENSIVE METABOLIC PANEL
ALT: 18 U/L (ref 0–44)
AST: 21 U/L (ref 15–41)
Albumin: 3.8 g/dL (ref 3.5–5.0)
Alkaline Phosphatase: 65 U/L (ref 38–126)
Anion gap: 8 (ref 5–15)
BUN: 29 mg/dL — ABNORMAL HIGH (ref 8–23)
CO2: 21 mmol/L — ABNORMAL LOW (ref 22–32)
Calcium: 8.8 mg/dL — ABNORMAL LOW (ref 8.9–10.3)
Chloride: 113 mmol/L — ABNORMAL HIGH (ref 98–111)
Creatinine, Ser: 2.32 mg/dL — ABNORMAL HIGH (ref 0.61–1.24)
GFR, Estimated: 29 mL/min — ABNORMAL LOW (ref >60)
Glucose, Bld: 124 mg/dL — ABNORMAL HIGH (ref 70–99)
Potassium: 4.3 mmol/L (ref 3.5–5.1)
Sodium: 142 mmol/L (ref 135–145)
Total Bilirubin: 0.4 mg/dL (ref 0.3–1.2)
Total Protein: 7.3 g/dL (ref 6.5–8.1)

## 2021-08-11 LAB — MAGNESIUM: Magnesium: 2.1 mg/dL (ref 1.7–2.4)

## 2021-08-11 MED ORDER — SODIUM CHLORIDE 0.9% FLUSH
10.0000 mL | INTRAVENOUS | Status: DC | PRN
  Administered 2021-08-11: 10 mL

## 2021-08-11 MED ORDER — HEPARIN SOD (PORK) LOCK FLUSH 100 UNIT/ML IV SOLN
500.0000 [IU] | Freq: Once | INTRAVENOUS | Status: AC | PRN
  Administered 2021-08-11: 500 [IU]

## 2021-08-11 MED ORDER — PALONOSETRON HCL INJECTION 0.25 MG/5ML
0.2500 mg | Freq: Once | INTRAVENOUS | Status: AC
  Administered 2021-08-11: 0.25 mg via INTRAVENOUS
  Filled 2021-08-11: qty 5

## 2021-08-11 MED ORDER — SODIUM CHLORIDE 0.9 % IV SOLN
750.0000 mg/m2 | Freq: Once | INTRAVENOUS | Status: AC
  Administered 2021-08-11: 1292 mg via INTRAVENOUS
  Filled 2021-08-11: qty 33.98

## 2021-08-11 MED ORDER — SODIUM CHLORIDE 0.9 % IV SOLN: Freq: Once | INTRAVENOUS | Status: AC

## 2021-08-11 NOTE — Progress Notes (Signed)
Patient has been examined by Dr. Delton Coombes, and vital signs and labs have been reviewed. ANC, Creatinine, LFTs, hemoglobin, and platelets are within treatment parameters per M.D. - pt may proceed with treatment.   Gemzar dose-reduced to 750 mg/m2 d/t elevated creatinine.

## 2021-08-11 NOTE — Patient Instructions (Signed)
[place] at [facility] Discharge Instructions   You were seen and examined today by Dr. Delton Coombes.  He reviewed your lab work. All results are stable but your kidney function. 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  Return as scheduled.    Thank you for choosing [place] at [facility] to provide your oncology and hematology care.  To afford each patient quality time with our provider, please arrive at least 15 minutes before your scheduled appointment time.   If you have a lab appointment with the [facility] please come in thru the Main Entrance and check in at the main information desk.  You need to re-schedule your appointment should you arrive 10 or more minutes late.  We strive to give you quality time with our providers, and arriving late affects you and other patients whose appointments are after yours.  Also, if you no show three or more times for appointments you may be dismissed from the clinic at the providers discretion.     Again, thank you for choosing [facility].  Our hope is that these requests will decrease the amount of time that you wait before being seen by our physicians.       _____________________________________________________________  Should you have questions after your visit to [facility], please contact our office at [phone] and follow the prompts.  Our office hours are 8:00 a.m. and 4:30 p.m. Monday - Friday.  Please note that voicemails left after 4:00 p.m. may not be returned until the following business day.  We are closed weekends and major holidays.  You do have access to a nurse 24-7, just call the main number to the clinic [phone] and do not press any options, hold on the line and a nurse will answer the phone.    For prescription refill requests, have your pharmacy contact our office and allow 72 hours.    Due to Covid, you will need to wear a mask upon  entering the hospital. If you do not have a mask, a mask will be given to you at the Main Entrance upon arrival. For doctor visits, patients may have 1 support person age 23 or older with them. For treatment visits, patients can not have anyone with them due to social distancing guidelines and our immunocompromised population.

## 2021-08-11 NOTE — Progress Notes (Signed)
Alexander [name], [place] [address]   CLINIC:  Medical Oncology/Hematology  PCP:  Alexander Evens, MD [place]. 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    07/28/2014 Initial Diagnosis   Oropharyngeal cancer    08/03/2014 Imaging   CT CAP- L supraclavicular lymphadenopathy is not completely visualized. This is better seen on the previous neck CT from 07/27/2014. Otherwise, no evidence for metastatic disease in the chest, abdomen, or pelvis.    08/03/2014 Imaging   Bone scan- Uptake at adjacent anterior LEFT 6, 7, 8 ribs likely representing trauma/fractures. Questionable nonspecific increased tracer localization at the posterior RIGHT 8th and 9th ribs, the adjacent nature which raises a a question of trauma as well    08/06/2014 Pathology Results   Dr. Benjamine [name]- Oropharynx, biopsy, Left - INVASIVE SQUAMOUS CELL CARCINOMA.    08/12/2014 Procedure   Dr. Enrique [name]- 1. Multiple extraction of tooth numbers 6, 17, 22, 23, 24, 25, 26, and 27. 3 Quadrants of alveoloplasty    08/17/2014 Pathology Results   PORT and G-TUBE placed by Dr. Carlis [name].    08/26/2014 PET scan   Large hypermetabolic mass in the left base of tongue. Activity extends across midline to the right base tongue. 2. Intensely hypermetabolic left cervical metastatic lymph nodes. Lymph nodes extend from the left level II position to the left supraclavi    09/01/2014 - 09/22/2014 Chemotherapy   Concurrent chemoradiation with Cisplatin 100 mg/m2 x 2 cycles  with Neulasta support. Held cycle #3 d/t renal toxicity.     09/03/2014 - 10/23/2014 Radiation Therapy   Treated in [place], IMRT Alexander [name]).  Base of tongue and bilat neck. Total dose: 70 Gy in 35 fractions. (of note, Alexander [name] did miss several treatments requiring BID dosing towards the end of treatment).     01/25/2015 PET scan   Near complete resolution of metabolic activity at the base of tongue. Minimal residual activity is likely post treatment effect. 2. Complete resolution of metabolic activity above LEFT cervical lymph nodes. No evidence of residual metabolically active     7/34/2876 Procedure   Port-a-cath removed Alexander [name])     05/03/2018 - 08/23/2018 Chemotherapy   The patient had dexamethasone (DECADRON) 4 MG tablet, 8 mg, Oral, Daily, 1 of 1 cycle, Start date: 05/01/2018, End date: 10/23/2018 palonosetron (ALOXI) injection 0.25 mg, 0.25 mg, Intravenous,  Once, 6 of 6 cycles Administration: 0.25 mg (05/03/2018), 0.25 mg (05/24/2018), 0.25 mg (06/14/2018), 0.25 mg (07/09/2018), 0.25 mg (07/30/2018), 0.25 mg (08/21/2018) pegfilgrastim-cbqv (UDENYCA) injection 6 mg, 6 mg, Subcutaneous, Once, 5 of 5 cycles Administration: 6 mg (05/27/2018), 6 mg (06/17/2018), 6 mg (07/11/2018), 6 mg (08/01/2018), 6 mg (08/23/2018) CARBOplatin (PARAPLATIN) 380 mg in sodium chloride 0.9 % 250 mL chemo infusion, 380 mg (100 % of original dose 381 mg), Intravenous,  Once, 6 of 6 cycles Dose modification:   (original dose 381 mg, Cycle 1),   (original dose 309.5 mg, Cycle 2), 307.5 mg (original dose 309.5 mg, Cycle 5) Administration: 380 mg (05/03/2018), 310 mg (05/24/2018), 310 mg (06/14/2018), 340 mg (07/09/2018), 310 mg (07/30/2018),  350 mg (08/21/2018) PACLitaxel (TAXOL) 330 mg in sodium chloride 0.9 % 500 mL chemo infusion (> 63m/m2), 175 mg/m2 = 330 mg (100 % of original dose 175 mg/m2), Intravenous,  Once, 6 of 6 cycles Dose modification: 175 mg/m2 (original dose 175 mg/m2, Cycle 1, Reason: Patient Age) Administration: 330 mg (05/03/2018),  330 mg (05/24/2018), 330 mg (06/14/2018), 330 mg (07/09/2018), 330 mg (07/30/2018), 330 mg (08/21/2018)   for chemotherapy treatment.     05/24/2018 - 05/06/2021 Chemotherapy   Patient is on Treatment Plan : HEAD/NECK Pembrolizumab/Taxol/Carboplatin Q21D      07/05/2021 -  Chemotherapy   Patient is on Treatment Plan : LUNG Gemcitabine D1,8 q21d      Squamous cell lung cancer, left ([facility]  06/14/2018 Initial Diagnosis   Squamous cell lung cancer, left (H[facility])      CANCER STAGING:  Cancer Staging  Oropharyngeal carcinoma (HCC) Staging form: Pharynx - Oropharynx, AJCC 7th Edition - Clinical: Stage IVA (T4a, N2b, M0) - Unsigned   INTERVAL HISTORY:  [name] a 71y.o. male, returns for routine follow-up of his left squamous cell lung cancer. CPhinehaswas last seen on 07/26/2021.   Today Alexander [name] reports feeling good. Alexander [name] has gained 6 lbs.   REVIEW OF SYSTEMS:  Review of Systems  Constitutional:  Positive for unexpected weight change (+6 lbs). Negative for appetite change and fatigue.  Musculoskeletal:  Positive for arthralgias (6/10 knees).  Psychiatric/Behavioral:  Positive for depression and sleep disturbance. The patient is nervous/anxious.   All other systems reviewed and are negative.  PAST MEDICAL/SURGICAL HISTORY:  Past Medical History:  Diagnosis Date   GERD (gastroesophageal reflux disease)    Mass of neck    dx. oropharyngeal squamous cell carcinoma- Chemo. radiation planned   [place] 07/[place]2014   dx. 3 weeks ago.- [place] [facility] [place]   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[facility] 08/06/2014   SCCa of Left BOT   Past Surgical History:  Procedure Laterality Date   BIOPSY  01/15/2018   Procedure: BIOPSY;  Surgeon: FDanie Binder MD;  [facility];  Service: Endoscopy;;  gastric   COLONOSCOPY N/A 03/13/2016   Procedure: COLONOSCOPY;  Surgeon: SDanie Binder MD;  [facility];  Service: Endoscopy;   Laterality: N/A;  2:15 PM   CYSTOSCOPY WITH INSERTION OF UROLIFT N/A 06/27/2021   Procedure: CYSTOSCOPY WITH INSERTION OF UROLIFT;  Surgeon: MCleon Gustin MD;  Location: [facility];  Service: Urology;  Laterality: N/A;   ESOPHAGOGASTRODUODENOSCOPY (EGD) WITH PROPOFOL N/A 08/17/2014   Procedure: ESOPHAGOGASTRODUODENOSCOPY (EGD) WITH PROPOFOL (procedure #1);  Surgeon: MAviva SignsMd, MD;  Location: [facility];  Service: General;  Laterality: N/A;   ESOPHAGOGASTRODUODENOSCOPY (EGD) WITH PROPOFOL N/A 01/15/2018   Procedure: ESOPHAGOGASTRODUODENOSCOPY (EGD) WITH PROPOFOL;  Surgeon: FDanie Binder MD;  [facility];  Service: Endoscopy;  Laterality: N/A;  9:30am   MULTIPLE EXTRACTIONS WITH ALVEOLOPLASTY N/A 08/12/2014   Procedure: Extraction of tooth #'s 6,17,22,23,24,25,26,27 with alveoloplasty;  Surgeon: RLenn Cal DDS;  [facility];  Service: Oral Surgery;  Laterality: N/A;   PANENDOSCOPY N/A 08/06/2014   Procedure: PANENDOSCOPY WITH BIOPSY;  Surgeon: SLeta Baptist MD;  [facility]  Service: ENT;  Laterality: N/A;   PEG PLACEMENT Left 08/17/14   PEG PLACEMENT N/A 08/17/2014   Procedure: PERCUTANEOUS ENDOSCOPIC GASTROSTOMY (PEG) PLACEMENT (procedure #1);  Surgeon: MAviva SignsMd, MD;  Location: [facility];  Service: General;  Laterality: N/A;  PORT-A-CATH REMOVAL Right 07/17/2016   Procedure: MINOR REMOVAL PORT-A-CATH;  Surgeon: Aviva Signs, MD;  Location[facility];  Service: General;  Laterality: Right;   PORTACATH PLACEMENT Right 08/17/14   PORTACATH PLACEMENT Right 08/17/2014   Procedure: INSERTION PORT-A-CATH (procedure #2);  Surgeon: Aviva Signs Md, MD;  Location: [facility];  Service: General;  Laterality: Right;   PORTACATH PLACEMENT Left 04/26/2018   Procedure: INSERTION PORT-A-CATH (attached catheter in left subclavian);  Surgeon: Aviva Signs, MD;  Location: [facility];  Service: General;  Laterality: Left;   SAVORY DILATION N/A 01/15/2018   Procedure: SAVORY  DILATION;  Surgeon: Danie Binder, MD;  [facility];  Service: Endoscopy;  Laterality: N/A;   VIDEO BRONCHOSCOPY WITH ENDOBRONCHIAL ULTRASOUND N/A 04/15/2018   Procedure: VIDEO BRONCHOSCOPY WITH ENDOBRONCHIAL ULTRASOUND;  Surgeon: Melrose Nakayama, MD;  Location: [facility] OR;  Service: Thoracic;  Laterality: N/A;    SOCIAL HISTORY:  Social History   Socioeconomic History   Marital status: Legally Separated    Spouse name: Not on file   Number of children: 5   Years of education: Not on file   Highest education level: Not on file  Occupational History   Not on file  Tobacco Use   Smoking status: Former    Packs/day: 0.50    Years: 30.00    Pack years: 15.00    Types: Cigarettes    Quit date: 07/22/2014    Years since quitting: 7.0   Smokeless tobacco: Never  Vaping Use   Vaping Use: Never used  Substance and Sexual Activity   Alcohol use: Not Currently    Alcohol/week: 0.0 standard drinks    Comment: None currently (11/09/17); previously 1-2 beers on the weekend   Drug use: No   Sexual activity: Yes  Other Topics Concern   Not on file  Social History Narrative   Not on file   Social Determinants of Health   Financial Resource Strain: Not on file  Food Insecurity: Not on file  Transportation Needs: Not on file  Physical Activity: Not on file  Stress: Not on file  Social Connections: Not on file  Intimate Partner Violence: Not on file    FAMILY HISTORY:  Family History  Problem Relation Age of Onset   Colon cancer Neg Hx    Gastric cancer Neg Hx    Esophageal cancer Neg Hx     CURRENT MEDICATIONS:  Current Outpatient Medications  Medication Sig Dispense Refill   feeding supplement, ENSURE ENLIVE, (ENSURE ENLIVE) LIQD Take 237 mLs by mouth 4 (four) times daily.      fluconazole (DIFLUCAN) 100 MG tablet Take 2 tablets (200 mg) by mouth on the first day then 1 tablet (100 mg) daily for the following 4 days 6 tablet 0   Gemcitabine HCl (GEMZAR IV)  Inject into the vein once a week. Days 1 & 8 q 21 days     HYDROcodone-acetaminophen (NORCO) 10-325 MG tablet Take 1 tablet by mouth every 12 (twelve) hours as needed. 60 tablet 0   levothyroxine (SYNTHROID) 88 MCG tablet TAKE ONE TABLET BY MOUTH DAILY BEFORE BREAKFAST 30 tablet 2   lidocaine (XYLOCAINE) 2 % solution Use as directed 15 mLs in the mouth or throat as needed for mouth pain. 450 mL 0   Melatonin 10 MG TABS Take 1 tablet by mouth at bedtime.     naproxen sodium (ALEVE) 220 MG tablet Take 220 mg by mouth daily as needed.     omeprazole (PRILOSEC)  20 MG capsule TAKE 1 CAPSULE BY MOUTH 30 MINUTES PRIOR TO BREAKFAST 90 capsule 0   tadalafil (CIALIS) 20 MG tablet Take 1 tablet (20 mg total) by mouth daily as needed. 10 tablet 5   tamsulosin (FLOMAX) 0.4 MG CAPS capsule Take 1 capsule (0.4 mg total) by mouth 2 (two) times daily. 60 capsule 11   No current facility-administered medications for this visit.    ALLERGIES:  No Known Allergies  PHYSICAL EXAM:  Performance status (ECOG): 1 - Symptomatic but completely ambulatory  There were no vitals filed for this visit. Wt Readings from Last 3 Encounters:  08/11/21 134 lb 9.6 oz (61.1 kg)  08/02/21 128 lb 9.6 oz (58.3 kg)  07/26/21 133 lb 3.2 oz (60.4 kg)   Physical Exam Vitals reviewed.  Constitutional:      Appearance: Normal appearance.  Cardiovascular:     Rate and Rhythm: Normal rate and regular rhythm.     Pulses: Normal pulses.     Heart sounds: Normal heart sounds.  Pulmonary:     Effort: Pulmonary effort is normal.     Breath sounds: Normal breath sounds.  Neurological:     General: No focal deficit present.     Mental Status: Alexander [name] is alert and oriented to person, place, and time.  Psychiatric:        Mood and Affect: Mood normal.        Behavior: Behavior normal.     LABORATORY DATA:  I have reviewed the labs as listed.     Latest Ref Rng & Units 08/11/2021    8:45 AM 08/02/2021   10:30 AM 07/26/2021    9:55 AM   CBC  WBC 4.0 - 10.5 K/uL 5.5   1.8   2.2    Hemoglobin 13.0 - 17.0 g/dL 10.7   9.6   9.7    Hematocrit 39.0 - 52.0 % 32.3   29.3   29.5    Platelets 150 - 400 K/uL 181   357   226        Latest Ref Rng & Units 08/11/2021    8:45 AM 08/02/2021   10:30 AM 07/26/2021    9:55 AM  CMP  Glucose 70 - 99 mg/dL 124   96   110    BUN 8 - 23 mg/dL 29   32   35    Creatinine 0.61 - 1.24 mg/dL 2.32   2.26   2.14    Sodium 135 - 145 mmol/L 142   138   139    Potassium 3.5 - 5.1 mmol/L 4.3   4.4   4.1    Chloride 98 - 111 mmol/L 113   106   105    CO2 22 - 32 mmol/L 21   23   24     Calcium 8.9 - 10.3 mg/dL 8.8   9.1   8.9    Total Protein 6.5 - 8.1 g/dL 7.3   7.6   7.5    Total Bilirubin 0.3 - 1.2 mg/dL 0.4   0.5   0.3    Alkaline Phos 38 - 126 U/L 65   57   56    AST 15 - 41 U/L 21   23   19     ALT 0 - 44 U/L 18   18   16       DIAGNOSTIC IMAGING:  I have independently reviewed the scans and discussed with the patient. No results found.   ASSESSMENT:  1.  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 -6 cycles of carboplatin, paclitaxel and pembrolizumab from 05/03/2018 through 08/21/2018. -Maintenance pembrolizumab started on 09/11/2018. -PET scan on 09/15/2019 showed interval decrease in hypermetabolic areas associated with tongue and floor of the mouth.  Hypermetabolic metastatic lymphadenopathy in the chest is [name].  No new sites seen. -PET scan on 03/15/2020 shows persistent, [name] hypermetabolism in the tongue/floor of mouth.  Slight interval decrease in hypermetabolism with mediastinal/hilar adenopathy.  Persistent hypermetabolic focus in the right supraclavicular region.  New focus of hypermetabolic them identified in the right external iliac chain of pelvis with no discernible adenopathy on the CT. -CT CAP on 11/24/2020 showed left lower lobe lung nodule measuring 1.3 x 1.1 cm, previously 1.0 x 0.9 cm on CT scan  from June.  Lesion was negative on PSMA PET scan.  [name] mediastinal lymph nodes. 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 - 6 cycles of carboplatin, paclitaxel and pembrolizumab from 01/12/2021 through 05/04/2021 with progression. - NGS test: No targetable mutations.  PD-L1 TPS is negative.  MSI-[name].  TMB-low. - Gemcitabine 2 weeks on/1 week off started on 07/05/2021.   2.  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  3.  Prostate cancer: - Evaluated by Dr. Alyson Ingles and biopsy was recommended.  Patient declined.  4.  Left kidney mass: - Biopsy of the left kidney mass was consistent with metastatic squamous cell carcinoma.   PLAN:  1.  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 - Alexander [name] is tolerating gemcitabine very well. - Reviewed labs today which showed normal LFTs.  Creatinine is elevated at 2.32.  CBC was grossly normal with mild anemia.  Last TSH was 2.6. - Proceed with cycle 2-day 8.  I have dose reduced gemcitabine 750 mg per metered square. - Alexander [name] will start C3 D1 in 2 weeks.  I will see him back in 3 weeks at the time of C3 D8.  Plan to repeat scans after C4.   2.  Hypothyroidism: - Continue Synthroid 88 mcg daily.  TSH is 2.6.   3.  Bilateral knee pains: - Continue hydrocodone 10 mg twice daily as needed.   4.  Nutrition: - Continue Ensure 350 cal can twice daily.  Weight is [name].   5.  CKD: - His creatinine is trending up.  Latest creatinine is 2.32. - Alexander [name] is also having urinary issues and is following with Dr. Alyson Ingles for a channel TURP.   Orders placed this encounter:  No orders of the defined types were placed in this encounter.    Derek Jack, MD [place] [phone]   I, Thana Ates, am acting as a scribe for Dr. Derek Jack.  I, Derek Jack MD, have reviewed the above documentation for accuracy and completeness, and I agree with the above.

## 2021-08-11 NOTE — Progress Notes (Signed)
Patient presents today for Gemzar infusion.  Patient is in satisfactory condition with no new complaints voiced.  Vital signs are stable.  Labs reviewed by Dr. Delton Coombes during his office visit.  Creatinine today is 2.32.  Dr. Delton Coombes has dose reduced his treatment due to his creatinine.  All other labs are within treatment parameters.  We will proceed with treatment per MD orders.   Patient tolerated treatment well with no complaints voiced.  Patient left ambulatory in stable condition.  Vital signs stable at discharge.  Follow up as scheduled.

## 2021-08-11 NOTE — Patient Instructions (Signed)
[facility]  Discharge Instructions: Thank you for choosing [facility] to provide your oncology and hematology care.  If you have a lab appointment with the [facility], please come in thru the Main Entrance and check in at the main information desk.  Wear comfortable clothing and clothing appropriate for easy access to any Portacath or PICC line.   We strive to give you quality time with your provider. You may need to reschedule your appointment if you arrive late (15 or more minutes).  Arriving late affects you and other patients whose appointments are after yours.  Also, if you miss three or more appointments without notifying the office, you may be dismissed from the clinic at the provider's discretion.      For prescription refill requests, have your pharmacy contact our office and allow 72 hours for refills to be completed.    Today you received the following chemotherapy and/or immunotherapy agents Gemzar.  Gemcitabine injection What is this medication? GEMCITABINE (jem SYE ta been) is a chemotherapy drug. This medicine is used to treat many types of cancer like breast cancer, lung cancer, pancreatic cancer, and ovarian cancer. This medicine may be used for other purposes; ask your health care provider or pharmacist if you have questions. COMMON BRAND NAME(S): Gemzar, Infugem What should I tell my care team before I take this medication? They need to know if you have any of these conditions: blood disorders infection kidney disease liver disease lung or breathing disease, like asthma recent or ongoing radiation therapy an unusual or allergic reaction to gemcitabine, other chemotherapy, other medicines, foods, dyes, or preservatives pregnant or trying to get pregnant breast-feeding How should I use this medication? This drug is given as an infusion into a vein. It is administered in a hospital or clinic by a specially trained health care  professional. Talk to your pediatrician regarding the use of this medicine in children. Special care may be needed. Overdosage: If you think you have taken too much of this medicine contact a poison control center or emergency room at once. NOTE: This medicine is only for you. Do not share this medicine with others. What if I miss a dose? It is important not to miss your dose. Call your doctor or health care professional if you are unable to keep an appointment. What may interact with this medication? medicines to increase blood counts like filgrastim, pegfilgrastim, sargramostim some other chemotherapy drugs like cisplatin vaccines Talk to your doctor or health care professional before taking any of these medicines: acetaminophen aspirin ibuprofen ketoprofen naproxen This list may not describe all possible interactions. Give your health care provider a list of all the medicines, herbs, non-prescription drugs, or dietary supplements you use. Also tell them if you smoke, drink alcohol, or use illegal drugs. Some items may interact with your medicine. What should I watch for while using this medication? Visit your doctor for checks on your progress. This drug may make you feel generally unwell. This is not uncommon, as chemotherapy can affect healthy cells as well as cancer cells. Report any side effects. Continue your course of treatment even though you feel ill unless your doctor tells you to stop. In some cases, you may be given additional medicines to help with side effects. Follow all directions for their use. Call your doctor or health care professional for advice if you get a fever, chills or sore throat, or other symptoms of a cold or flu. Do not treat yourself.  This drug decreases your body's ability to fight infections. Try to avoid being around people who are sick. This medicine may increase your risk to bruise or bleed. Call your doctor or health care professional if you notice any  unusual bleeding. Be careful brushing and flossing your teeth or using a toothpick because you may get an infection or bleed more easily. If you have any dental work done, tell your dentist you are receiving this medicine. Avoid taking products that contain aspirin, acetaminophen, ibuprofen, naproxen, or ketoprofen unless instructed by your doctor. These medicines may hide a fever. Do not become pregnant while taking this medicine or for 6 months after stopping it. Women should inform their doctor if they wish to become pregnant or think they might be pregnant. Men should not father a child while taking this medicine and for 3 months after stopping it. There is a potential for serious side effects to an unborn child. Talk to your health care professional or pharmacist for more information. Do not breast-feed an infant while taking this medicine or for at least 1 week after stopping it. Men should inform their doctors if they wish to father a child. This medicine may lower sperm counts. Talk with your doctor or health care professional if you are concerned about your fertility. What side effects may I notice from receiving this medication? Side effects that you should report to your doctor or health care professional as soon as possible: allergic reactions like skin rash, itching or hives, swelling of the face, lips, or tongue breathing problems pain, redness, or irritation at site where injected signs and symptoms of a dangerous change in heartbeat or heart rhythm like chest pain; dizziness; fast or irregular heartbeat; palpitations; feeling faint or lightheaded, falls; breathing problems signs of decreased platelets or bleeding - bruising, pinpoint red spots on the skin, black, tarry stools, blood in the urine signs of decreased red blood cells - unusually weak or tired, feeling faint or lightheaded, falls signs of infection - fever or chills, cough, sore throat, pain or difficulty passing urine signs  and symptoms of kidney injury like trouble passing urine or change in the amount of urine signs and symptoms of liver injury like dark yellow or Alyss Granato urine; general ill feeling or flu-like symptoms; light-colored stools; loss of appetite; nausea; right upper belly pain; unusually weak or tired; yellowing of the eyes or skin swelling of ankles, feet, hands Side effects that usually do not require medical attention (report to your doctor or health care professional if they continue or are bothersome): constipation diarrhea hair loss loss of appetite nausea rash vomiting This list may not describe all possible side effects. Call your doctor for medical advice about side effects. You may report side effects to FDA at 1-800-FDA-1088. Where should I keep my medication? This drug is given in a hospital or clinic and will not be stored at home. NOTE: This sheet is a summary. It may not cover all possible information. If you have questions about this medicine, talk to your doctor, pharmacist, or health care provider.  2023 Elsevier/Gold Standard (2017-06-06 00:00:00)       To help prevent nausea and vomiting after your treatment, we encourage you to take your nausea medication as directed.  BELOW ARE SYMPTOMS THAT SHOULD BE REPORTED IMMEDIATELY: *FEVER GREATER THAN 100.4 F (38 C) OR HIGHER *CHILLS OR SWEATING *NAUSEA AND VOMITING THAT IS NOT CONTROLLED WITH YOUR NAUSEA MEDICATION *UNUSUAL SHORTNESS OF BREATH *UNUSUAL BRUISING OR BLEEDING *URINARY PROBLEMS (  pain or burning when urinating, or frequent urination) *BOWEL PROBLEMS (unusual diarrhea, constipation, pain near the anus) TENDERNESS IN MOUTH AND THROAT WITH OR WITHOUT PRESENCE OF ULCERS (sore throat, sores in mouth, or a toothache) UNUSUAL RASH, SWELLING OR PAIN  UNUSUAL VAGINAL DISCHARGE OR ITCHING   Items with * indicate a potential emergency and should be followed up as soon as possible or go to the Emergency Department if any  problems should occur.  Please show the CHEMOTHERAPY ALERT CARD or IMMUNOTHERAPY ALERT CARD at check-in to the Emergency Department and triage nurse.  Should you have questions after your visit or need to cancel or reschedule your appointment, please contact [facility] [phone]  and follow the prompts.  Office hours are 8:00 a.m. to 4:30 p.m. Monday - Friday. Please note that voicemails left after 4:00 p.m. may not be returned until the following business day.  We are closed weekends and major holidays. You have access to a nurse at all times for urgent questions. Please call the main number to the clinic [phone] and follow the prompts.  For any non-urgent questions, you may also contact your provider using MyChart. We now offer e-Visits for anyone 24 and older to request care online for non-urgent symptoms. For details visit [email or web address].   Also download the MyChart app! Go to the app store, search "MyChart", open the app, select [facility], and log in with your MyChart username and password.  Due to Covid, a mask is required upon entering the hospital/clinic. If you do not have a mask, one will be given to you upon arrival. For doctor visits, patients may have 1 support person aged 28 or older with them. For treatment visits, patients cannot have anyone with them due to current Covid guidelines and our immunocompromised population.

## 2021-08-16 ENCOUNTER — Other Ambulatory Visit (HOSPITAL_COMMUNITY): Payer: Medicare Other

## 2021-08-16 ENCOUNTER — Ambulatory Visit (HOSPITAL_COMMUNITY): Payer: Medicare Other | Admitting: Hematology

## 2021-08-16 ENCOUNTER — Telehealth: Payer: Self-pay

## 2021-08-16 ENCOUNTER — Ambulatory Visit (HOSPITAL_COMMUNITY): Payer: Medicare Other

## 2021-08-16 NOTE — Telephone Encounter (Signed)
I spoke with Alexander [name]. We have discussed possible surgery dates and 08/29/2021 was agreed upon by all parties. Patient given information about surgery date, what to expect pre-operatively and post operatively.    We discussed that a pre-op nurse will be calling to set up the pre-op visit that will take place prior to surgery. Informed patient that our office will communicate any additional care to be provided after surgery.    Patients questions or concerns were discussed during our call. Advised to call our office should there be any additional information, questions or concerns that arise. Patient verbalized understanding.

## 2021-08-20 ENCOUNTER — Encounter (HOSPITAL_COMMUNITY): Payer: Self-pay | Admitting: *Deleted

## 2021-08-20 ENCOUNTER — Emergency Department (HOSPITAL_COMMUNITY)
Admission: EM | Admit: 2021-08-20 | Discharge: 2021-08-20 | Disposition: A | Discharge status: Home / Self Care | Payer: Medicare Other | Attending: Emergency Medicine | Admitting: Emergency Medicine

## 2021-08-20 ENCOUNTER — Other Ambulatory Visit: Payer: Self-pay

## 2021-08-20 DIAGNOSIS — T83091A Other mechanical complication of indwelling urethral catheter, initial encounter: Secondary | ICD-10-CM | POA: Insufficient documentation

## 2021-08-20 DIAGNOSIS — Y732 Prosthetic and other implants, materials and accessory gastroenterology and urology devices associated with adverse incidents: Secondary | ICD-10-CM | POA: Diagnosis not present

## 2021-08-20 DIAGNOSIS — R1030 Lower abdominal pain, unspecified: Secondary | ICD-10-CM | POA: Diagnosis not present

## 2021-08-20 DIAGNOSIS — R339 Retention of urine, unspecified: Secondary | ICD-10-CM | POA: Insufficient documentation

## 2021-08-20 DIAGNOSIS — T839XXA Unspecified complication of genitourinary prosthetic device, implant and graft, initial encounter: Secondary | ICD-10-CM

## 2021-08-20 LAB — URINALYSIS, ROUTINE W REFLEX MICROSCOPIC
Bacteria, UA: NONE SEEN
Bilirubin Urine: NEGATIVE
Glucose, UA: NEGATIVE mg/dL
Ketones, ur: NEGATIVE mg/dL
Nitrite: POSITIVE — AB
Protein, ur: 100 mg/dL — AB
RBC / HPF: >50 RBC/hpf — ABNORMAL HIGH (ref 0–5)
Specific Gravity, Urine: 1.006 (ref 1.005–1.030)
WBC, UA: >50 WBC/hpf — ABNORMAL HIGH (ref 0–5)
pH: 6 (ref 5.0–8.0)

## 2021-08-20 NOTE — Discharge Instructions (Signed)
Your testing today does not reveal any obvious infection in your urine however if the culture comes back showing that you need an antibiotic you will be called.  Otherwise if you develop severe abdominal pain worsening nausea vomiting or fever please return to the emergency department immediately.  Please follow-up with your urologist for your procedure within 9 days at the appointed time.

## 2021-08-20 NOTE — ED Provider Notes (Signed)
[facility] Provider Note   CSN: [id number] Arrival date & time: 08/20/21  1225     History  Chief Complaint  Patient presents with   Urinary Retention    Alexander [name] is a 71 y.o. male.  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  No fevers, no chills, no back pain.  Home Medications Prior to Admission medications   Medication Sig Start Date End Date Taking? Authorizing Provider  Gemcitabine HCl (GEMZAR IV) Inject into the vein once a week. Days 1 & 8 q 21 days   Yes [provider]  HYDROcodone-acetaminophen (NORCO) 10-325 MG tablet Take 1 tablet by mouth every 12 (twelve) hours as needed. Patient taking differently: Take 1 tablet by mouth every 12 (twelve) hours as needed for moderate pain. 08/02/21  Yes Pennington, Rebekah M, PA-C  levothyroxine (SYNTHROID) 88 MCG tablet TAKE ONE TABLET BY MOUTH DAILY BEFORE BREAKFAST Patient taking differently: Take 88 mcg by mouth daily before breakfast. TAKE ONE TABLET BY MOUTH DAILY BEFORE BREAKFAST 08/02/21  Yes Derek Jack, MD  naproxen sodium (ALEVE) 220 MG tablet Take 220 mg by mouth daily as needed (pain.).   Yes [provider]  omeprazole (PRILOSEC) 20 MG capsule TAKE 1 CAPSULE BY MOUTH 30 MINUTES PRIOR TO BREAKFAST Patient taking differently: Take 20 mg by mouth daily. TAKE 1 CAPSULE BY MOUTH 30 MINUTES PRIOR TO BREAKFAST 04/04/21  Yes Annitta Needs, NP  tadalafil (CIALIS) 20 MG tablet Take 1 tablet (20 mg total) by mouth daily as needed. 02/16/21  Yes McKenzie,  Candee Furbish, MD  tamsulosin (FLOMAX) 0.4 MG CAPS capsule Take 1 capsule (0.4 mg total) by mouth 2 (two) times daily. 07/01/21  Yes Summerlin, Berneice Heinrich, PA-C  prochlorperazine (COMPAZINE) 10 MG tablet Take 1 tablet (10 mg total) by mouth every 6 (six) hours as needed (Nausea or vomiting). Patient not taking: Reported on 11/13/2018 05/01/18 12/04/18  Zoila Shutter, MD      Allergies    Patient has no known allergies.    Review of Systems   Review of Systems  Constitutional:  Negative for fever.  Gastrointestinal:  Positive for abdominal pain.  Genitourinary:  Positive for difficulty urinating.   Physical Exam Updated Vital Signs BP (!) 136/56   Pulse 65   Temp 97.7 F (36.5 C) (Oral)   Resp 18   Ht 1.854 m (6\' 1" )   Wt 60.4 kg   SpO2 99%   BMI 17.57 kg/m  Physical Exam Vitals and nursing note reviewed.  Constitutional:      Appearance: He is well-developed. He is not diaphoretic.  HENT:     Head: Normocephalic and atraumatic.  Eyes:     General:        Right eye: No discharge.        Left eye: No discharge.     Conjunctiva/sclera: Conjunctivae normal.  Pulmonary:     Effort: Pulmonary effort is normal. No respiratory distress.  Abdominal:     Tenderness: There is abdominal tenderness.  Comments: There is a tender full lower abdomen and suprapubic region up to the level of the umbilicus, no CVA tenderness  Genitourinary:    Comments: Normal-appearing uncircumcised penis, Foley catheter is well-seated in the urethra, normal scrotum and testicle Skin:    General: Skin is warm and dry.     Findings: No erythema or rash.  Neurological:     Mental Status: He is alert.     Coordination: Coordination normal.    ED Results / Procedures / Treatments   Labs (all labs ordered are listed, but only abnormal results are displayed) Labs Reviewed  URINALYSIS, ROUTINE W REFLEX MICROSCOPIC - Abnormal; Notable for the following components:      Result Value   Color, Urine BROWN  (*)    APPearance CLOUDY (*)    Hgb urine dipstick LARGE (*)    Protein, ur 100 (*)    Nitrite POSITIVE (*)    Leukocytes,Ua MODERATE (*)    RBC / HPF >50 (*)    WBC, UA >50 (*)    All other components within normal limits  URINE CULTURE    EKG None  Radiology No results found.  Procedures Procedures    Medications Ordered in ED Medications - No data to display  ED Course/ Medical Decision Making/ A&P                           Medical Decision Making Amount and/or Complexity of Data Reviewed Labs: ordered.   This patient presents to the ED for concern of abdominal discomfort differential diagnosis includes urinary retention, cystitis, hemorrhagic cystitis, less likely to be appendicitis or cholecystitis given the location of the pain    Additional history obtained:  Additional history obtained from electronic medical record External records from outside source obtained and reviewed including urology visit records, last had catheter change about 2 weeks ago   Lab Tests:  I Ordered, and personally interpreted labs.  The pertinent results include: Urinalysis and culture.  No bacteria seen, there were white blood cells and red blood cells in the urine.  The patient is afebrile and not tachycardic, will rely on culture to dictate antibiotics, not needed at this time.   Imaging Studies ordered:  Imaging studies not needed   Medicines ordered and prescription drug management:  Evaluated the patient's medication list, seems appropriate, no changes needed   Problem List / ED Course:  Foley catheter was successfully flushed without any difficulty   Social Determinants of Health:  None, patient has good follow-up with urology coming up.  Will be contacted if urine culture needs antibiotic           Final Clinical Impression(s) / ED Diagnoses Final diagnoses:  Foley catheter problem, initial encounter [facility])    Rx / DC Orders ED Discharge Orders      None         Noemi Chapel, MD 08/20/21 1547

## 2021-08-20 NOTE — ED Notes (Signed)
Pt urethral catheter irrigated with 200 mL sterile water and urine output was 600 mL. Pt verbalized relief following irrigation. No pain after irrigation. Catheter intact and patent

## 2021-08-20 NOTE — ED Triage Notes (Signed)
Pt states his foley catheter is stopped up, recent clots noted in his catheter.

## 2021-08-23 ENCOUNTER — Other Ambulatory Visit (HOSPITAL_COMMUNITY): Payer: Medicare Other

## 2021-08-23 ENCOUNTER — Ambulatory Visit (HOSPITAL_COMMUNITY): Payer: Medicare Other

## 2021-08-24 LAB — URINE CULTURE: Culture: >=100000 — AB

## 2021-08-25 ENCOUNTER — Telehealth: Payer: Self-pay | Admitting: *Deleted

## 2021-08-25 ENCOUNTER — Inpatient Hospital Stay (HOSPITAL_COMMUNITY): Payer: Medicare Other

## 2021-08-25 ENCOUNTER — Inpatient Hospital Stay (HOSPITAL_COMMUNITY): Payer: Medicare Other | Attending: Hematology

## 2021-08-25 VITALS — BP 127/64 | HR 68 | Temp 98.0°F | Resp 18

## 2021-08-25 DIAGNOSIS — N189 Chronic kidney disease, unspecified: Secondary | ICD-10-CM | POA: Insufficient documentation

## 2021-08-25 DIAGNOSIS — C61 Malignant neoplasm of prostate: Secondary | ICD-10-CM | POA: Insufficient documentation

## 2021-08-25 DIAGNOSIS — C3492 Malignant neoplasm of unspecified part of left bronchus or lung: Secondary | ICD-10-CM

## 2021-08-25 DIAGNOSIS — Z923 Personal history of irradiation: Secondary | ICD-10-CM | POA: Diagnosis not present

## 2021-08-25 DIAGNOSIS — M25562 Pain in left knee: Secondary | ICD-10-CM | POA: Insufficient documentation

## 2021-08-25 DIAGNOSIS — C7902 Secondary malignant neoplasm of left kidney and renal pelvis: Secondary | ICD-10-CM | POA: Insufficient documentation

## 2021-08-25 DIAGNOSIS — E039 Hypothyroidism, unspecified: Secondary | ICD-10-CM | POA: Insufficient documentation

## 2021-08-25 DIAGNOSIS — Z87891 Personal history of nicotine dependence: Secondary | ICD-10-CM | POA: Diagnosis not present

## 2021-08-25 DIAGNOSIS — M25561 Pain in right knee: Secondary | ICD-10-CM | POA: Diagnosis not present

## 2021-08-25 DIAGNOSIS — Z85810 Personal history of malignant neoplasm of tongue: Secondary | ICD-10-CM | POA: Diagnosis not present

## 2021-08-25 DIAGNOSIS — Z9221 Personal history of antineoplastic chemotherapy: Secondary | ICD-10-CM | POA: Diagnosis not present

## 2021-08-25 DIAGNOSIS — C109 Malignant neoplasm of oropharynx, unspecified: Secondary | ICD-10-CM

## 2021-08-25 DIAGNOSIS — Z5111 Encounter for antineoplastic chemotherapy: Secondary | ICD-10-CM | POA: Insufficient documentation

## 2021-08-25 DIAGNOSIS — Z79890 Hormone replacement therapy (postmenopausal): Secondary | ICD-10-CM | POA: Diagnosis not present

## 2021-08-25 LAB — COMPREHENSIVE METABOLIC PANEL
ALT: 17 U/L (ref 0–44)
AST: 20 U/L (ref 15–41)
Albumin: 3.8 g/dL (ref 3.5–5.0)
Alkaline Phosphatase: 55 U/L (ref 38–126)
Anion gap: 6 (ref 5–15)
BUN: 37 mg/dL — ABNORMAL HIGH (ref 8–23)
CO2: 23 mmol/L (ref 22–32)
Calcium: 8.9 mg/dL (ref 8.9–10.3)
Chloride: 109 mmol/L (ref 98–111)
Creatinine, Ser: 2.29 mg/dL — ABNORMAL HIGH (ref 0.61–1.24)
GFR, Estimated: 30 mL/min — ABNORMAL LOW (ref >60)
Glucose, Bld: 88 mg/dL (ref 70–99)
Potassium: 4.5 mmol/L (ref 3.5–5.1)
Sodium: 138 mmol/L (ref 135–145)
Total Bilirubin: 0.4 mg/dL (ref 0.3–1.2)
Total Protein: 7 g/dL (ref 6.5–8.1)

## 2021-08-25 LAB — CBC WITH DIFFERENTIAL/PLATELET
Abs Immature Granulocytes: 0.01 10*3/uL (ref 0.00–0.07)
Basophils Absolute: 0 10*3/uL (ref 0.0–0.1)
Basophils Relative: 0 %
Eosinophils Absolute: 0.1 10*3/uL (ref 0.0–0.5)
Eosinophils Relative: 2 %
HCT: 29.4 % — ABNORMAL LOW (ref 39.0–52.0)
Hemoglobin: 9.6 g/dL — ABNORMAL LOW (ref 13.0–17.0)
Immature Granulocytes: 0 %
Lymphocytes Relative: 20 %
Lymphs Abs: 0.9 10*3/uL (ref 0.7–4.0)
MCH: 30.8 pg (ref 26.0–34.0)
MCHC: 32.7 g/dL (ref 30.0–36.0)
MCV: 94.2 fL (ref 80.0–100.0)
Monocytes Absolute: 0.7 10*3/uL (ref 0.1–1.0)
Monocytes Relative: 15 %
Neutro Abs: 2.8 10*3/uL (ref 1.7–7.7)
Neutrophils Relative %: 63 %
Platelets: 181 10*3/uL (ref 150–400)
RBC: 3.12 MIL/uL — ABNORMAL LOW (ref 4.22–5.81)
RDW: 18.1 % — ABNORMAL HIGH (ref 11.5–15.5)
WBC: 4.5 10*3/uL (ref 4.0–10.5)
nRBC: 0 % (ref 0.0–0.2)

## 2021-08-25 LAB — MAGNESIUM: Magnesium: 2.1 mg/dL (ref 1.7–2.4)

## 2021-08-25 LAB — TSH: TSH: 4.151 u[IU]/mL (ref 0.350–4.500)

## 2021-08-25 MED ORDER — HEPARIN SOD (PORK) LOCK FLUSH 100 UNIT/ML IV SOLN
500.0000 [IU] | Freq: Once | INTRAVENOUS | Status: AC | PRN
  Administered 2021-08-25: 500 [IU]

## 2021-08-25 MED ORDER — SODIUM CHLORIDE 0.9 % IV SOLN: Freq: Once | INTRAVENOUS | Status: AC

## 2021-08-25 MED ORDER — SODIUM CHLORIDE 0.9 % IV SOLN
750.0000 mg/m2 | Freq: Once | INTRAVENOUS | Status: AC
  Administered 2021-08-25: 1292 mg via INTRAVENOUS
  Filled 2021-08-25: qty 26.3

## 2021-08-25 MED ORDER — PALONOSETRON HCL INJECTION 0.25 MG/5ML
0.2500 mg | Freq: Once | INTRAVENOUS | Status: AC
  Administered 2021-08-25: 0.25 mg via INTRAVENOUS
  Filled 2021-08-25: qty 5

## 2021-08-25 MED ORDER — SODIUM CHLORIDE 0.9% FLUSH
10.0000 mL | INTRAVENOUS | Status: DC | PRN
  Administered 2021-08-25: 10 mL

## 2021-08-25 NOTE — Progress Notes (Signed)
Pt presents today Gemzar per provider's order. Pt's creatinine is 2.29 today Dr.K made aware no additional fluids needed at this time per Dr.K. Faythe Ghee to proceed with treatment today per Dr.K.  Gemzar given today per MD orders. Tolerated infusion without adverse affects. Vital signs stable. No complaints at this time. Discharged from clinic ambulatory in stable condition. Alert and oriented x 3. F/U with [facility] as scheduled.

## 2021-08-25 NOTE — Telephone Encounter (Signed)
Post ED Visit - Positive Culture Follow-up  Culture report reviewed by antimicrobial stewardship pharmacist: [place] Team []  Elenor Quinones, Pharm.D. []  Heide Guile, Pharm.D., BCPS AQ-ID []  Parks Neptune, Pharm.D., BCPS []  Alycia Rossetti, Pharm.D., BCPS []  [place], Pharm.D., BCPS, AAHIVP []  Legrand Como, Pharm.D., BCPS, AAHIVP []  Salome Arnt, PharmD, BCPS []  Johnnette Gourd, PharmD, BCPS []  Hughes Better, PharmD, BCPS []  Leeroy Cha, PharmD []  Laqueta Linden, PharmD, BCPS []  Albertina Parr, PharmD  [facility] Team []  Leodis Sias, PharmD []  Lindell Spar, PharmD []  [name], PharmD []  Graylin Shiver, Rph []  Rema Fendt) Glennon Mac, PharmD []  Arlyn Dunning, PharmD []  Netta Cedars, PharmD []  Dia Sitter, PharmD []  Leone Haven, PharmD []  Gretta Arab, PharmD []  Theodis Shove, PharmD []  Peggyann Juba, PharmD []  Reuel Boom, PharmD   Positive urine culture Scheduled for TURP on 08/29/2021, no UTI symptoms and no further patient follow-up is required at this time. K Horton, DO  Harlon Flor Talley 08/25/2021, 10:49 AM

## 2021-08-25 NOTE — Patient Instructions (Signed)
[facility]  Discharge Instructions: Thank you for choosing [facility] to provide your oncology and hematology care.  If you have a lab appointment with the [facility], please come in thru the Main Entrance and check in at the main information desk.  Wear comfortable clothing and clothing appropriate for easy access to any Portacath or PICC line.   We strive to give you quality time with your provider. You may need to reschedule your appointment if you arrive late (15 or more minutes).  Arriving late affects you and other patients whose appointments are after yours.  Also, if you miss three or more appointments without notifying the office, you may be dismissed from the clinic at the provider's discretion.      For prescription refill requests, have your pharmacy contact our office and allow 72 hours for refills to be completed.    Today you received the following chemotherapy and/or immunotherapy agents Gemzar   To help prevent nausea and vomiting after your treatment, we encourage you to take your nausea medication as directed.  BELOW ARE SYMPTOMS THAT SHOULD BE REPORTED IMMEDIATELY: *FEVER GREATER THAN 100.4 F (38 C) OR HIGHER *CHILLS OR SWEATING *NAUSEA AND VOMITING THAT IS NOT CONTROLLED WITH YOUR NAUSEA MEDICATION *UNUSUAL SHORTNESS OF BREATH *UNUSUAL BRUISING OR BLEEDING *URINARY PROBLEMS (pain or burning when urinating, or frequent urination) *BOWEL PROBLEMS (unusual diarrhea, constipation, pain near the anus) TENDERNESS IN MOUTH AND THROAT WITH OR WITHOUT PRESENCE OF ULCERS (sore throat, sores in mouth, or a toothache) UNUSUAL RASH, SWELLING OR PAIN  UNUSUAL VAGINAL DISCHARGE OR ITCHING   Items with * indicate a potential emergency and should be followed up as soon as possible or go to the Emergency Department if any problems should occur.  Please show the CHEMOTHERAPY ALERT CARD or IMMUNOTHERAPY ALERT CARD at check-in to the Emergency Department  and triage nurse.  Should you have questions after your visit or need to cancel or reschedule your appointment, please contact [facility] [phone]  and follow the prompts.  Office hours are 8:00 a.m. to 4:30 p.m. Monday - Friday. Please note that voicemails left after 4:00 p.m. may not be returned until the following business day.  We are closed weekends and major holidays. You have access to a nurse at all times for urgent questions. Please call the main number to the clinic [phone] and follow the prompts.  For any non-urgent questions, you may also contact your provider using MyChart. We now offer e-Visits for anyone 53 and older to request care online for non-urgent symptoms. For details visit [email or web address].   Also download the MyChart app! Go to the app store, search "MyChart", open the app, select [facility], and log in with your MyChart username and password.  Due to Covid, a mask is required upon entering the hospital/clinic. If you do not have a mask, one will be given to you upon arrival. For doctor visits, patients may have 1 support person aged 4 or older with them. For treatment visits, patients cannot have anyone with them due to current Covid guidelines and our immunocompromised population.  Gemcitabine injection What is this medication? GEMCITABINE (jem SYE ta been) is a chemotherapy drug. This medicine is used to treat many types of cancer like breast cancer, lung cancer, pancreatic cancer, and ovarian cancer. This medicine may be used for other purposes; ask your health care provider or pharmacist if you have questions. COMMON BRAND NAME(S): Gemzar, Infugem What should I  tell my care team before I take this medication? They need to know if you have any of these conditions: blood disorders infection kidney disease liver disease lung or breathing disease, like asthma recent or ongoing radiation therapy an unusual or allergic reaction to  gemcitabine, other chemotherapy, other medicines, foods, dyes, or preservatives pregnant or trying to get pregnant breast-feeding How should I use this medication? This drug is given as an infusion into a vein. It is administered in a hospital or clinic by a specially trained health care professional. Talk to your pediatrician regarding the use of this medicine in children. Special care may be needed. Overdosage: If you think you have taken too much of this medicine contact a poison control center or emergency room at once. NOTE: This medicine is only for you. Do not share this medicine with others. What if I miss a dose? It is important not to miss your dose. Call your doctor or health care professional if you are unable to keep an appointment. What may interact with this medication? medicines to increase blood counts like filgrastim, pegfilgrastim, sargramostim some other chemotherapy drugs like cisplatin vaccines Talk to your doctor or health care professional before taking any of these medicines: acetaminophen aspirin ibuprofen ketoprofen naproxen This list may not describe all possible interactions. Give your health care provider a list of all the medicines, herbs, non-prescription drugs, or dietary supplements you use. Also tell them if you smoke, drink alcohol, or use illegal drugs. Some items may interact with your medicine. What should I watch for while using this medication? Visit your doctor for checks on your progress. This drug may make you feel generally unwell. This is not uncommon, as chemotherapy can affect healthy cells as well as cancer cells. Report any side effects. Continue your course of treatment even though you feel ill unless your doctor tells you to stop. In some cases, you may be given additional medicines to help with side effects. Follow all directions for their use. Call your doctor or health care professional for advice if you get a fever, chills or sore  throat, or other symptoms of a cold or flu. Do not treat yourself. This drug decreases your body's ability to fight infections. Try to avoid being around people who are sick. This medicine may increase your risk to bruise or bleed. Call your doctor or health care professional if you notice any unusual bleeding. Be careful brushing and flossing your teeth or using a toothpick because you may get an infection or bleed more easily. If you have any dental work done, tell your dentist you are receiving this medicine. Avoid taking products that contain aspirin, acetaminophen, ibuprofen, naproxen, or ketoprofen unless instructed by your doctor. These medicines may hide a fever. Do not become pregnant while taking this medicine or for 6 months after stopping it. Women should inform their doctor if they wish to become pregnant or think they might be pregnant. Men should not father a child while taking this medicine and for 3 months after stopping it. There is a potential for serious side effects to an unborn child. Talk to your health care professional or pharmacist for more information. Do not breast-feed an infant while taking this medicine or for at least 1 week after stopping it. Men should inform their doctors if they wish to father a child. This medicine may lower sperm counts. Talk with your doctor or health care professional if you are concerned about your fertility. What side effects may I  notice from receiving this medication? Side effects that you should report to your doctor or health care professional as soon as possible: allergic reactions like skin rash, itching or hives, swelling of the face, lips, or tongue breathing problems pain, redness, or irritation at site where injected signs and symptoms of a dangerous change in heartbeat or heart rhythm like chest pain; dizziness; fast or irregular heartbeat; palpitations; feeling faint or lightheaded, falls; breathing problems signs of decreased  platelets or bleeding - bruising, pinpoint red spots on the skin, black, tarry stools, blood in the urine signs of decreased red blood cells - unusually weak or tired, feeling faint or lightheaded, falls signs of infection - fever or chills, cough, sore throat, pain or difficulty passing urine signs and symptoms of kidney injury like trouble passing urine or change in the amount of urine signs and symptoms of liver injury like dark yellow or brown urine; general ill feeling or flu-like symptoms; light-colored stools; loss of appetite; nausea; right upper belly pain; unusually weak or tired; yellowing of the eyes or skin swelling of ankles, feet, hands Side effects that usually do not require medical attention (report to your doctor or health care professional if they continue or are bothersome): constipation diarrhea hair loss loss of appetite nausea rash vomiting This list may not describe all possible side effects. Call your doctor for medical advice about side effects. You may report side effects to FDA at 1-800-FDA-1088. Where should I keep my medication? This drug is given in a hospital or clinic and will not be stored at home. NOTE: This sheet is a summary. It may not cover all possible information. If you have questions about this medicine, talk to your doctor, pharmacist, or health care provider.  2023 [employer] Standard (2017-06-06 00:00:00)

## 2021-08-25 NOTE — Patient Instructions (Signed)
Alexander [name]  08/25/2021     @PREFPERIOPPHARMACY @   Your procedure is scheduled on 0700.  Report to [facility] at 0700 A.M.  Call this number if you have problems the morning of surgery:  [phone]   Remember:  Do not eat or drink after midnight.                  Take these medicines the morning of surgery with A SIP OF WATER prilosec, synthroid, flomax & pain pill if needed.    Do not wear jewelry, make-up or nail polish.  Do not wear lotions, powders, or perfumes, or deodorant.  Do not shave 48 hours prior to surgery.  Men may shave face and neck.  Do not bring valuables to the hospital.  [facility] is not responsible for any belongings or valuables.  Contacts, dentures or bridgework may not be worn into surgery.  Leave your suitcase in the car.  After surgery it may be brought to your room.  For patients admitted to the hospital, discharge time will be determined by your treatment team.  Patients discharged the day of surgery will not be allowed to drive home.   Name and phone number of your driver:   family Special instructions:    Please read over the following fact sheets that you were given. Surgical Site Infection Prevention, Anesthesia Post-op Instructions, and Care and Recovery After Surgery      How to Use Chlorhexidine for Bathing Chlorhexidine gluconate (CHG) is a germ-killing (antiseptic) solution that is used to clean the skin. It can get rid of the bacteria that normally live on the skin and can keep them away for about 24 hours. To clean your skin with CHG, you may be given: A CHG solution to use in the shower or as part of a sponge bath. A prepackaged cloth that contains CHG. Cleaning your skin with CHG may help lower the risk for infection: While you are staying in the intensive care unit of the hospital. If you have a vascular access, such as a central line, to provide short-term or long-term access to your veins. If you have a catheter to  drain urine from your bladder. If you are on a ventilator. A ventilator is a machine that helps you breathe by moving air in and out of your lungs. After surgery. What are the risks? Risks of using CHG include: A skin reaction. Hearing loss, if CHG gets in your ears and you have a perforated eardrum. Eye injury, if CHG gets in your eyes and is not rinsed out. The CHG product catching fire. Make sure that you avoid smoking and flames after applying CHG to your skin. Do not use CHG: If you have a chlorhexidine allergy or have previously reacted to chlorhexidine. On babies younger than 93 months of age. How to use CHG solution Use CHG only as told by your health care provider, and follow the instructions on the label. Use the full amount of CHG as directed. Usually, this is one bottle. During a shower Follow these steps when using CHG solution during a shower (unless your health care provider gives you different instructions): Start the shower. Use your normal soap and shampoo to wash your face and hair. Turn off the shower or move out of the shower stream. Pour the CHG onto a clean washcloth. Do not use any type of brush or rough-edged sponge. Starting at your neck, lather your body down to your toes. Make  sure you follow these instructions: If you will be having surgery, pay special attention to the part of your body where you will be having surgery. Scrub this area for at least 1 minute. Do not use CHG on your head or face. If the solution gets into your ears or eyes, rinse them well with water. Avoid your genital area. Avoid any areas of skin that have broken skin, cuts, or scrapes. Scrub your back and under your arms. Make sure to wash skin folds. Let the lather sit on your skin for 1-2 minutes or as long as told by your health care provider. Thoroughly rinse your entire body in the shower. Make sure that all body creases and crevices are rinsed well. Dry off with a clean towel. Do not  put any substances on your body afterward--such as powder, lotion, or perfume--unless you are told to do so by your health care provider. Only use lotions that are recommended by the manufacturer. Put on clean clothes or pajamas. If it is the night before your surgery, sleep in clean sheets.  During a sponge bath Follow these steps when using CHG solution during a sponge bath (unless your health care provider gives you different instructions): Use your normal soap and shampoo to wash your face and hair. Pour the CHG onto a clean washcloth. Starting at your neck, lather your body down to your toes. Make sure you follow these instructions: If you will be having surgery, pay special attention to the part of your body where you will be having surgery. Scrub this area for at least 1 minute. Do not use CHG on your head or face. If the solution gets into your ears or eyes, rinse them well with water. Avoid your genital area. Avoid any areas of skin that have broken skin, cuts, or scrapes. Scrub your back and under your arms. Make sure to wash skin folds. Let the lather sit on your skin for 1-2 minutes or as long as told by your health care provider. Using a different clean, wet washcloth, thoroughly rinse your entire body. Make sure that all body creases and crevices are rinsed well. Dry off with a clean towel. Do not put any substances on your body afterward--such as powder, lotion, or perfume--unless you are told to do so by your health care provider. Only use lotions that are recommended by the manufacturer. Put on clean clothes or pajamas. If it is the night before your surgery, sleep in clean sheets. How to use CHG prepackaged cloths Only use CHG cloths as told by your health care provider, and follow the instructions on the label. Use the CHG cloth on clean, dry skin. Do not use the CHG cloth on your head or face unless your health care provider tells you to. When washing with the CHG  cloth: Avoid your genital area. Avoid any areas of skin that have broken skin, cuts, or scrapes. Before surgery Follow these steps when using a CHG cloth to clean before surgery (unless your health care provider gives you different instructions): Using the CHG cloth, vigorously scrub the part of your body where you will be having surgery. Scrub using a back-and-forth motion for 3 minutes. The area on your body should be completely wet with CHG when you are done scrubbing. Do not rinse. Discard the cloth and let the area air-dry. Do not put any substances on the area afterward, such as powder, lotion, or perfume. Put on clean clothes or pajamas. If it is the  night before your surgery, sleep in clean sheets.  For general bathing Follow these steps when using CHG cloths for general bathing (unless your health care provider gives you different instructions). Use a separate CHG cloth for each area of your body. Make sure you wash between any folds of skin and between your fingers and toes. Wash your body in the following order, switching to a new cloth after each step: The front of your neck, shoulders, and chest. Both of your arms, under your arms, and your hands. Your stomach and groin area, avoiding the genitals. Your right leg and foot. Your left leg and foot. The back of your neck, your back, and your buttocks. Do not rinse. Discard the cloth and let the area air-dry. Do not put any substances on your body afterward--such as powder, lotion, or perfume--unless you are told to do so by your health care provider. Only use lotions that are recommended by the manufacturer. Put on clean clothes or pajamas. Contact a health care provider if: Your skin gets irritated after scrubbing. You have questions about using your solution or cloth. You swallow any chlorhexidine. Call your local poison control center (1-8652148587 in the [place] Get help right away if: Your eyes itch badly, or they become  very red or swollen. Your skin itches badly and is red or swollen. Your hearing changes. You have trouble seeing. You have swelling or tingling in your mouth or throat. You have trouble breathing. These symptoms may represent a serious problem that is an emergency. Do not wait to see if the symptoms will go away. Get medical help right away. Call your local emergency services (911 in the [place] Do not drive yourself to the hospital. Summary Chlorhexidine gluconate (CHG) is a germ-killing (antiseptic) solution that is used to clean the skin. Cleaning your skin with CHG may help to lower your risk for infection. You may be given CHG to use for bathing. It may be in a bottle or in a prepackaged cloth to use on your skin. Carefully follow your health care provider's instructions and the instructions on the product label. Do not use CHG if you have a chlorhexidine allergy. Contact your health care provider if your skin gets irritated after scrubbing. This information is not intended to replace advice given to you by your health care provider. Make sure you discuss any questions you have with your health care provider. Document Revised: 05/24/2020 Document Reviewed: 05/24/2020 Elsevier Patient Education  [place] POST-ANESTHESIA  IMMEDIATELY FOLLOWING SURGERY:  Do not drive or operate machinery for the first twenty four hours after surgery.  Do not make any important decisions for twenty four hours after surgery or while taking narcotic pain medications or sedatives.  If you develop intractable nausea and vomiting or a severe headache please notify your doctor immediately.  FOLLOW-UP:  Please make an appointment with your surgeon as instructed. You do not need to follow up with anesthesia unless specifically instructed to do so.  WOUND CARE INSTRUCTIONS (if applicable):  Keep a dry clean dressing on the anesthesia/puncture wound site if there is drainage.  Once the  wound has quit draining you may leave it open to air.  Generally you should leave the bandage intact for twenty four hours unless there is drainage.  If the epidural site drains for more than 36-48 hours please call the anesthesia department.  QUESTIONS?:  Please feel free to call your physician or the hospital operator if you have any questions,  and they will be happy to assist you.      Transurethral Resection of the Prostate, Care After The following information offers guidance on how to care for yourself after your procedure. Your health care provider may also give you more specific instructions. If you have problems or questions, contact your health care provider. What can I expect after the procedure? After the procedure, it is common to have: Mild pain in your lower abdomen. Soreness or mild discomfort in your penis or when you urinate. This is from having the catheter inserted during the procedure. A sudden urge to urinate (urgency). A need to urinate often. A small amount of blood in your urine. You may notice some small blood clots in your urine. These are normal. Follow these instructions at home: Medicines Take over-the-counter and prescription medicines only as told by your health care provider. If you were prescribed an antibiotic medicine, take it as told by your health care provider. Do not stop taking the antibiotic even if you start to feel better. Activity  Rest as told by your health care provider. Avoid sitting for a long time without moving. Get up to take short walks every 1-2 hours. This is important to improve blood flow and breathing. Ask for help if you feel weak or unsteady. You may increase your physical activity gradually as you start to feel better. Do not drive or operate machinery until your health care provider says that it is safe. Do not ride in a car for long periods of time, or as told by your health care provider. Avoid intense physical activity for as  long as told by your health care provider. Do not lift anything that is heavier than 10 lb (4.5 kg), or the limit that you are told, until your health care provider says that it is safe. Do not have sex until your health care provider approves. Return to your normal activities as told by your health care provider. Ask your health care provider what activities are safe for you. Preventing constipation You may need to take these actions to prevent or treat constipation: Drink enough fluid to keep your urine pale yellow. Take over-the-counter or prescription medicines. Eat foods that are high in fiber, such as beans, whole grains, and fresh fruits and vegetables. Limit foods that are high in fat and processed sugars, such as fried or sweet foods.  General instructions Do not strain when you have a bowel movement. Straining may lead to bleeding from the prostate. This may cause blood clots and trouble urinating. Do not use any products that contain nicotine or tobacco. These products include cigarettes, chewing tobacco, and vaping devices, such as e-cigarettes. If you need help quitting, ask your health care provider. If you go home with a tube draining your urine (urinary catheter), care for the catheter as told by your health care provider. Wear compression stockings as told by your health care provider. These stockings help to prevent blood clots and reduce swelling in your legs. Keep all follow-up visits. This is important. Contact a health care provider if: You have signs of infection, such as: Fever or chills. Urine that smells very bad. Swelling around your urethra that is getting worse. Swelling in your penis or testicles. You have difficulty urinating. You have pain that gets worse or does not improve with medicine. You have blood in your urine that does not go away after 1 week of resting and drinking more fluids. You have trouble having a bowel movement.  You have trouble having or  keeping an erection. No semen comes out during orgasm (dry ejaculation). You have a urinary catheter in place, and you have: Spasms or pain. Problems with your catheter or your catheter is blocked. Get help right away if: You are unable to urinate. You are having more blood clots in your urine instead of fewer. You have: Large blood clots. A lot of blood in your urine. Pain in your back or lower abdomen. You have difficulty breathing or shortness of breath. You develop swelling or pain in your leg. These symptoms may be an emergency. Get help right away. Call 911. Do not wait to see if the symptoms will go away. Do not drive yourself to the hospital. Summary After the procedure, it is common to have a small amount of blood in your urine. Follow restrictions about lifting and sexual activity as told by your health care provider. Ask what activities are safe for you. Keep all follow-up visits. This is important. This information is not intended to replace advice given to you by your health care provider. Make sure you discuss any questions you have with your health care provider. Document Revised: 12/07/2020 Document Reviewed: 12/07/2020 Elsevier Patient Education  [place]. Transurethral Resection of the Prostate Transurethral resection of the prostate (TURP) is the removal, or resection, of part of the prostate tissue. This procedure is done to treat an enlarged prostate gland (benign prostatic hyperplasia). The goal of TURP is to remove enough prostate tissue to allow for a normal flow of urine. The procedure will allow you to empty your bladder more completely when you urinate so that you can urinate less often. In a transurethral resection, a thin telescope with a light, a camera, and an electric cutting edge (resectoscope) is passed through the urethra and into the prostate. The opening of the urethra is at the end of the penis. Tell a health care provider about: Any  allergies you have. All medicines you are taking, including vitamins, herbs, eye drops, creams, and over-the-counter medicines. Any problems you or family members have had with anesthetic medicines. Any bleeding problems you have. Any surgeries you have had. Any medical conditions you have. Any prostate infections you have had. What are the risks? Generally, this is a safe procedure. However, problems may occur, including: Infection. Bleeding. Allergic reactions to medicines. Blood in the urine (hematuria). Damage to nearby structures or organs. Other problems may occur, but they are rare. They include: Dry ejaculation, or having no semen come out during orgasm. Erectile dysfunction, or being unable to have or keep an erection. Scarring that leads to narrowing of the urethra. This narrowing may block the flow of urine. Inability to control when you urinate (incontinence). Deep vein thrombosis. This is a blood clot that can develop in your leg. TURP syndrome. This can happen when you lose too much sodium during or after the procedure. Some signs and symptoms of this condition include: Weakness. Headaches. Nausea or vomiting. Muscle cramping. What happens before the procedure? When to stop eating and drinking Follow instructions from your health care provider about what you may eat and drink before your procedure. These may include: 8 hours before your procedure Stop eating most foods. Do not eat meat, fried foods, or fatty foods. Eat only light foods, such as toast or crackers. All liquids are okay except energy drinks and alcohol. 6 hours before your procedure Stop eating. Drink only clear liquids, such as water, clear fruit juice, black coffee,  plain tea, and sports drinks. Do not drink energy drinks or alcohol. 2 hours before your procedure Stop drinking all liquids. You may be allowed to take medicines with small sips of water. If you do not follow your health care  provider's instructions, your procedure may be delayed or canceled. Medicines Ask your health care provider about: Changing or stopping your regular medicines. This is especially important if you are taking diabetes medicines or blood thinners. Taking medicines such as aspirin and ibuprofen. These medicines can thin your blood. Do not take these medicines unless your health care provider tells you to take them. Taking over-the-counter medicines, vitamins, herbs, and supplements. Surgery safety Ask your health care provider what steps will be taken to help prevent infection. These steps may include: Removing hair at the surgery site. Washing skin with a germ-killing soap. Taking antibiotic medicine. General instructions Do not use any products that contain nicotine or tobacco for at least 4 weeks before the procedure. These products include cigarettes, chewing tobacco, and vaping devices, such as e-cigarettes. If you need help quitting, ask your health care provider. If you will be going home right after the procedure, plan to have a responsible adult: Take you home from the hospital or clinic. You will not be allowed to drive. Care for you for the time you are told. What happens during the procedure?  An IV will be inserted into one of your veins. You will be given one or more of the following: A medicine to help you relax (sedative). A medicine to make you fall asleep (general anesthetic). A medicine that is injected into your spine to numb the area below and slightly above the injection site (spinal anesthetic). Your legs will be placed in foot rests (stirrups) so that your legs are apart and your knees are bent. The resectoscope will be passed through your urethra to your prostate. Parts of your prostate will be resected using the cutting edge of the resectoscope. Fluid will be passed to rinse out the cut tissues (irrigation). The resectoscope will be removed. A small, thin tube  (catheter) will be passed through your urethra and into your bladder. The catheter will drain urine into a bag outside of your body. The procedure may vary among health care providers and hospitals. What happens after the procedure? Your blood pressure, heart rate, breathing rate, and blood oxygen level will be monitored until you leave the hospital or clinic. You will be given fluids through the IV. The IV will be removed when you start eating and drinking normally. You may have some pain. Pain medicine will be available to help you. You will have a catheter draining your urine. You may have blood in your urine. Your catheter may be kept in until your urine is clear. Your urinary drainage will be monitored. If necessary, your bladder may be rinsed out (irrigated) through your catheter. You will be encouraged to walk around as soon as possible. You may have to wear compression stockings. These stockings help to prevent blood clots and reduce swelling in your legs. If you were given a sedative during the procedure, it can affect you for several hours. Do not drive or operate machinery until your health care provider says that it is safe. Summary Transurethral resection of the prostate (TURP) is the removal (resection) of part of the prostate tissue. The goal of this procedure is to remove enough prostate tissue to allow for a normal flow of urine. Follow instructions from your health care provider  about taking medicines and about eating and drinking before the procedure. This information is not intended to replace advice given to you by your health care provider. Make sure you discuss any questions you have with your health care provider. Document Revised: 12/07/2020 Document Reviewed: 12/07/2020 Elsevier Patient Education  [place]. Benign Prostatic Hyperplasia  Benign prostatic hyperplasia (BPH) is an enlarged prostate gland that is caused by the normal aging process. The prostate may  get bigger as a man gets older. The condition is not caused by cancer. The prostate is a walnut-sized gland that is involved in the production of semen. It is located in front of the rectum and below the bladder. The bladder stores urine. The urethra carries stored urine out of the body. An enlarged prostate can press on the urethra. This can make it harder to pass urine. The buildup of urine in the bladder can cause infection. Back pressure and infection may progress to bladder damage and kidney (renal) failure. What are the causes? This condition is part of the normal aging process. However, not all men develop problems from this condition. If the prostate enlarges away from the urethra, urine flow will not be blocked. If it enlarges toward the urethra and compresses it, there will be problems passing urine. What increases the risk? This condition is more likely to develop in men older than 50 years. What are the signs or symptoms? Symptoms of this condition include: Getting up often during the night to urinate. Needing to urinate frequently during the day. Difficulty starting urine flow. Decrease in size and strength of your urine stream. Leaking (dribbling) after urinating. Inability to pass urine. This needs immediate treatment. Inability to completely empty your bladder. Pain when you pass urine. This is more common if there is also an infection. Urinary tract infection (UTI). How is this diagnosed? This condition is diagnosed based on your medical history, a physical exam, and your symptoms. Tests will also be done, such as: A post-void bladder scan. This measures any amount of urine that may remain in your bladder after you finish urinating. A digital rectal exam. In a rectal exam, your health care provider checks your prostate by putting a lubricated, gloved finger into your rectum to feel the back of your prostate gland. This exam detects the size of your gland and any abnormal lumps or  growths. An exam of your urine (urinalysis). A prostate specific antigen (PSA) screening. This is a blood test used to screen for prostate cancer. An ultrasound. This test uses sound waves to electronically produce a picture of your prostate gland. Your health care provider may refer you to a specialist in kidney and prostate diseases (urologist). How is this treated? Once symptoms begin, your health care provider will monitor your condition (active surveillance or watchful waiting). Treatment for this condition will depend on the severity of your condition. Treatment may include: Observation and yearly exams. This may be the only treatment needed if your condition and symptoms are mild. Medicines to relieve your symptoms, including: Medicines to shrink the prostate. Medicines to relax the muscle of the prostate. Surgery in severe cases. Surgery may include: Prostatectomy. In this procedure, the prostate tissue is removed completely through an open incision or with a laparoscope or robotics. Transurethral resection of the prostate (TURP). In this procedure, a tool is inserted through the opening at the tip of the penis (urethra). It is used to cut away tissue of the inner core of the prostate. The pieces  are removed through the same opening of the penis. This removes the blockage. Transurethral incision (TUIP). In this procedure, small cuts are made in the prostate. This lessens the prostate's pressure on the urethra. Transurethral microwave thermotherapy (TUMT). This procedure uses microwaves to create heat. The heat destroys and removes a small amount of prostate tissue. Transurethral needle ablation (TUNA). This procedure uses radio frequencies to destroy and remove a small amount of prostate tissue. Interstitial laser coagulation ([place]). This procedure uses a laser to destroy and remove a small amount of prostate tissue. Transurethral electrovaporization (TUVP). This procedure uses electrodes to  destroy and remove a small amount of prostate tissue. Prostatic urethral lift. This procedure inserts an implant to push the lobes of the prostate away from the urethra. Follow these instructions at home: Take over-the-counter and prescription medicines only as told by your health care provider. Monitor your symptoms for any changes. Contact your health care provider with any changes. Avoid drinking large amounts of liquid before going to bed or out in public. Avoid or reduce how much caffeine or alcohol you drink. Give yourself time when you urinate. Keep all follow-up visits. This is important. Contact a health care provider if: You have unexplained back pain. Your symptoms do not get better with treatment. You develop side effects from the medicine you are taking. Your urine becomes very dark or has a bad smell. Your lower abdomen becomes distended and you have trouble passing urine. Get help right away if: You have a fever or chills. You suddenly cannot urinate. You feel light-headed or very dizzy, or you faint. There are large amounts of blood or clots in your urine. Your urinary problems become hard to manage. You develop moderate to severe low back or flank pain. The flank is the side of your body between the ribs and the hip. These symptoms may be an emergency. Get help right away. Call 911. Do not wait to see if the symptoms will go away. Do not drive yourself to the hospital. Summary Benign prostatic hyperplasia (BPH) is an enlarged prostate that is caused by the normal aging process. It is not caused by cancer. An enlarged prostate can press on the urethra. This can make it hard to pass urine. This condition is more likely to develop in men older than 50 years. Get help right away if you suddenly cannot urinate. This information is not intended to replace advice given to you by your health care provider. Make sure you discuss any questions you have with your health care  provider. Document Revised: 09/29/2020 Document Reviewed: 09/29/2020 Elsevier Patient Education  [place].

## 2021-08-26 ENCOUNTER — Encounter (HOSPITAL_COMMUNITY)
Admission: RE | Admit: 2021-08-26 | Discharge: 2021-08-26 | Disposition: A | Discharge status: Home / Self Care | Payer: Medicare Other | Source: Ambulatory Visit | Attending: Urology | Admitting: Urology

## 2021-08-29 ENCOUNTER — Encounter (HOSPITAL_COMMUNITY): Payer: Self-pay | Admitting: Urology

## 2021-08-29 ENCOUNTER — Ambulatory Visit (HOSPITAL_BASED_OUTPATIENT_CLINIC_OR_DEPARTMENT_OTHER): Payer: Medicare Other

## 2021-08-29 ENCOUNTER — Other Ambulatory Visit: Payer: Self-pay

## 2021-08-29 ENCOUNTER — Encounter (HOSPITAL_COMMUNITY)
Admission: RE | Disposition: A | Discharge status: Home / Self Care | Payer: Self-pay | Source: Ambulatory Visit | Attending: Urology

## 2021-08-29 ENCOUNTER — Ambulatory Visit (HOSPITAL_COMMUNITY): Payer: Medicare Other

## 2021-08-29 ENCOUNTER — Observation Stay (HOSPITAL_COMMUNITY)
Admission: RE | Admit: 2021-08-29 | Discharge: 2021-08-30 | Disposition: A | Discharge status: Home / Self Care | Payer: Medicare Other | Source: Ambulatory Visit | Attending: Urology | Admitting: Urology

## 2021-08-29 DIAGNOSIS — Z85818 Personal history of malignant neoplasm of other sites of lip, oral cavity, and pharynx: Secondary | ICD-10-CM | POA: Diagnosis not present

## 2021-08-29 DIAGNOSIS — Z87891 Personal history of nicotine dependence: Secondary | ICD-10-CM | POA: Diagnosis not present

## 2021-08-29 DIAGNOSIS — N401 Enlarged prostate with lower urinary tract symptoms: Secondary | ICD-10-CM

## 2021-08-29 DIAGNOSIS — N138 Other obstructive and reflux uropathy: Secondary | ICD-10-CM | POA: Diagnosis not present

## 2021-08-29 DIAGNOSIS — Z85810 Personal history of malignant neoplasm of tongue: Secondary | ICD-10-CM | POA: Insufficient documentation

## 2021-08-29 DIAGNOSIS — R339 Retention of urine, unspecified: Secondary | ICD-10-CM | POA: Diagnosis not present

## 2021-08-29 DIAGNOSIS — R338 Other retention of urine: Secondary | ICD-10-CM

## 2021-08-29 HISTORY — PX: TRANSURETHRAL RESECTION OF PROSTATE: SHX73

## 2021-08-29 HISTORY — PX: CYSTOSCOPY: SHX5120

## 2021-08-29 SURGERY — CYSTOSCOPY
Anesthesia: General | Site: Ureter

## 2021-08-29 MED ORDER — ZOLPIDEM TARTRATE 5 MG PO TABS: 5.0000 mg | ORAL_TABLET | Freq: Every evening | ORAL | Status: DC | PRN

## 2021-08-29 MED ORDER — CHLORHEXIDINE GLUCONATE 0.12 % MT SOLN
15.0000 mL | Freq: Once | OROMUCOSAL | Status: AC
  Administered 2021-08-29: 15 mL via OROMUCOSAL

## 2021-08-29 MED ORDER — PANTOPRAZOLE SODIUM 40 MG PO TBEC
40.0000 mg | DELAYED_RELEASE_TABLET | Freq: Every day | ORAL | Status: DC
  Administered 2021-08-30: 40 mg via ORAL
  Filled 2021-08-29: qty 1

## 2021-08-29 MED ORDER — SODIUM CHLORIDE 0.9 % IV SOLN
2.0000 g | INTRAVENOUS | Status: AC
  Administered 2021-08-29: 2 g via INTRAVENOUS

## 2021-08-29 MED ORDER — LIDOCAINE HCL (PF) 2 % IJ SOLN
INTRAMUSCULAR | Status: AC
  Filled 2021-08-29: qty 10

## 2021-08-29 MED ORDER — FENTANYL CITRATE (PF) 100 MCG/2ML IJ SOLN
INTRAMUSCULAR | Status: DC | PRN
  Administered 2021-08-29: 50 ug via INTRAVENOUS

## 2021-08-29 MED ORDER — DEXAMETHASONE SODIUM PHOSPHATE 10 MG/ML IJ SOLN
INTRAMUSCULAR | Status: AC
  Filled 2021-08-29: qty 1

## 2021-08-29 MED ORDER — WATER FOR IRRIGATION, STERILE IR SOLN
Status: DC | PRN
  Administered 2021-08-29: 500 mL

## 2021-08-29 MED ORDER — DIPHENHYDRAMINE HCL 50 MG/ML IJ SOLN: 12.5000 mg | Freq: Four times a day (QID) | INTRAMUSCULAR | Status: DC | PRN

## 2021-08-29 MED ORDER — ONDANSETRON HCL 4 MG/2ML IJ SOLN
4.0000 mg | Freq: Once | INTRAMUSCULAR | Status: DC | PRN
Start: 2021-08-29 — End: 2021-08-29

## 2021-08-29 MED ORDER — SODIUM CHLORIDE 0.9 % IV SOLN
INTRAVENOUS | Status: AC
  Filled 2021-08-29: qty 20

## 2021-08-29 MED ORDER — LIDOCAINE HCL (CARDIAC) PF 100 MG/5ML IV SOSY
PREFILLED_SYRINGE | INTRAVENOUS | Status: DC | PRN
  Administered 2021-08-29: 60 mg via INTRAVENOUS

## 2021-08-29 MED ORDER — LACTATED RINGERS IV SOLN: INTRAVENOUS | Status: DC

## 2021-08-29 MED ORDER — ONDANSETRON HCL 4 MG/2ML IJ SOLN
INTRAMUSCULAR | Status: DC | PRN
  Administered 2021-08-29: 4 mg via INTRAVENOUS

## 2021-08-29 MED ORDER — PROPOFOL 10 MG/ML IV BOLUS
INTRAVENOUS | Status: AC
  Filled 2021-08-29: qty 20

## 2021-08-29 MED ORDER — FENTANYL CITRATE PF 50 MCG/ML IJ SOSY
PREFILLED_SYRINGE | INTRAMUSCULAR | Status: AC
  Filled 2021-08-29: qty 1

## 2021-08-29 MED ORDER — PROPOFOL 10 MG/ML IV BOLUS
INTRAVENOUS | Status: DC | PRN
  Administered 2021-08-29: 50 mg via INTRAVENOUS
  Administered 2021-08-29: 100 mg via INTRAVENOUS

## 2021-08-29 MED ORDER — DEXAMETHASONE SODIUM PHOSPHATE 4 MG/ML IJ SOLN
INTRAMUSCULAR | Status: DC | PRN
  Administered 2021-08-29: 5 mg via INTRAVENOUS

## 2021-08-29 MED ORDER — ONDANSETRON HCL 4 MG/2ML IJ SOLN
INTRAMUSCULAR | Status: AC
  Filled 2021-08-29: qty 4

## 2021-08-29 MED ORDER — ONDANSETRON HCL 4 MG/2ML IJ SOLN: 4.0000 mg | INTRAMUSCULAR | Status: DC | PRN

## 2021-08-29 MED ORDER — OXYCODONE-ACETAMINOPHEN 5-325 MG PO TABS
1.0000 | ORAL_TABLET | ORAL | Status: DC | PRN
  Administered 2021-08-29: 1 via ORAL
  Filled 2021-08-29: qty 1

## 2021-08-29 MED ORDER — LEVOTHYROXINE SODIUM 88 MCG PO TABS
88.0000 ug | ORAL_TABLET | Freq: Every day | ORAL | Status: DC
  Administered 2021-08-30: 88 ug via ORAL
  Filled 2021-08-29: qty 1

## 2021-08-29 MED ORDER — HYDROMORPHONE HCL 1 MG/ML IJ SOLN: 0.5000 mg | INTRAMUSCULAR | Status: DC | PRN

## 2021-08-29 MED ORDER — FENTANYL CITRATE PF 50 MCG/ML IJ SOSY
25.0000 ug | PREFILLED_SYRINGE | INTRAMUSCULAR | Status: DC | PRN
  Administered 2021-08-29 (×2): 50 ug via INTRAVENOUS

## 2021-08-29 MED ORDER — SODIUM CHLORIDE 0.9 % IR SOLN
Status: DC | PRN
  Administered 2021-08-29 (×4): 3000 mL

## 2021-08-29 MED ORDER — FENTANYL CITRATE (PF) 100 MCG/2ML IJ SOLN
INTRAMUSCULAR | Status: AC
  Filled 2021-08-29: qty 2

## 2021-08-29 MED ORDER — DIPHENHYDRAMINE HCL 12.5 MG/5ML PO ELIX: 12.5000 mg | ORAL_SOLUTION | Freq: Four times a day (QID) | ORAL | Status: DC | PRN

## 2021-08-29 MED ORDER — SODIUM CHLORIDE 0.9 % IV SOLN: INTRAVENOUS | Status: DC

## 2021-08-29 MED ORDER — SODIUM CHLORIDE 0.9 % IV SOLN
Freq: Once | INTRAVENOUS | Status: AC
  Administered 2021-08-29: 1000 mL via INTRAVENOUS

## 2021-08-29 MED ORDER — PHENYLEPHRINE HCL (PRESSORS) 10 MG/ML IV SOLN
INTRAVENOUS | Status: DC | PRN
  Administered 2021-08-29: 80 ug via INTRAVENOUS

## 2021-08-29 MED ORDER — SODIUM CHLORIDE 0.9 % IV SOLN
2.0000 g | INTRAVENOUS | Status: DC
  Administered 2021-08-30: 2 g via INTRAVENOUS
  Filled 2021-08-29: qty 20

## 2021-08-29 MED ORDER — SODIUM CHLORIDE 0.9 % IR SOLN
3000.0000 mL | Status: DC
  Administered 2021-08-29 – 2021-08-30 (×4): 3000 mL

## 2021-08-29 MED ORDER — CHLORHEXIDINE GLUCONATE CLOTH 2 % EX PADS
6.0000 | MEDICATED_PAD | Freq: Every day | CUTANEOUS | Status: DC
  Administered 2021-08-30: 6 via TOPICAL

## 2021-08-29 MED ORDER — ORAL CARE MOUTH RINSE: 15.0000 mL | Freq: Once | OROMUCOSAL | Status: AC

## 2021-08-29 MED ORDER — HYOSCYAMINE SULFATE 0.125 MG SL SUBL: 0.1250 mg | SUBLINGUAL_TABLET | SUBLINGUAL | Status: DC | PRN

## 2021-08-29 MED ORDER — ACETAMINOPHEN 325 MG PO TABS: 650.0000 mg | ORAL_TABLET | ORAL | Status: DC | PRN

## 2021-08-29 SURGICAL SUPPLY — 28 items
BAG DRAIN URO TABLE W/ADPT NS (BAG) ×3 IMPLANT
BAG DRN 8 ADPR NS SKTRN CSTL (BAG) ×2
BAG DRN URN TUBE DRIP CHMBR (OSTOMY) ×2
BAG HAMPER (MISCELLANEOUS) ×3 IMPLANT
BAG URINE DRAIN TURP 4L (OSTOMY) ×3 IMPLANT
CATH FOLEY 3WAY 30CC 22FR (CATHETERS) ×1 IMPLANT
CLOTH BEACON ORANGE TIMEOUT ST (SAFETY) ×3 IMPLANT
ELECT REM PT RETURN 9FT ADLT (ELECTROSURGICAL) ×3
ELECTRODE REM PT RTRN 9FT ADLT (ELECTROSURGICAL) ×2 IMPLANT
GLOVE BIO SURGEON STRL SZ8 (GLOVE) ×3 IMPLANT
GLOVE BIOGEL PI IND STRL 6.5 (GLOVE) IMPLANT
GLOVE BIOGEL PI IND STRL 7.0 (GLOVE) ×4 IMPLANT
GLOVE BIOGEL PI INDICATOR 6.5 (GLOVE) ×1
GLOVE BIOGEL PI INDICATOR 7.0 (GLOVE) ×2
GLOVE SS BIOGEL STRL SZ 6.5 (GLOVE) IMPLANT
GLOVE SUPERSENSE BIOGEL SZ 6.5 (GLOVE) ×1
GOWN STRL REUS W/TWL LRG LVL3 (GOWN DISPOSABLE) ×6 IMPLANT
GOWN STRL REUS W/TWL XL LVL3 (GOWN DISPOSABLE) ×3 IMPLANT
IV NS IRRIG 3000ML ARTHROMATIC (IV SOLUTION) ×14 IMPLANT
KIT TURNOVER CYSTO (KITS) ×3 IMPLANT
LOOP CUT BIPOLAR 24F LRG (ELECTROSURGICAL) ×3 IMPLANT
MANIFOLD NEPTUNE II (INSTRUMENTS) ×3 IMPLANT
PACK CYSTO (CUSTOM PROCEDURE TRAY) ×3 IMPLANT
PAD ARMBOARD 7.5X6 YLW CONV (MISCELLANEOUS) ×3 IMPLANT
SYR TOOMEY IRRIG 70ML (MISCELLANEOUS) ×3
SYRINGE TOOMEY IRRIG 70ML (MISCELLANEOUS) ×2 IMPLANT
TOWEL NATURAL 4PK STERILE (DISPOSABLE) ×3 IMPLANT
WATER STERILE IRR 500ML POUR (IV SOLUTION) ×3 IMPLANT

## 2021-08-29 NOTE — Anesthesia Procedure Notes (Signed)
Procedure Name: LMA Insertion Date/Time: 08/29/2021 8:53 AM Performed by: Louann Sjogren, MD Pre-anesthesia Checklist: Patient identified, Emergency Drugs available, Suction available, Patient being monitored and Timeout performed Patient Re-evaluated:Patient Re-evaluated prior to induction Oxygen Delivery Method: Circle system utilized Preoxygenation: Pre-oxygenation with 100% oxygen Induction Type: IV induction LMA: LMA inserted LMA Size: 4.0 Tube type: Oral

## 2021-08-29 NOTE — Interval H&P Note (Signed)
History and Physical Interval Note:  08/29/2021 8:15 AM  Alexander [name]  has presented today for surgery, with the diagnosis of BPH with urinary retention.  The various methods of treatment have been discussed with the patient and family. After consideration of risks, benefits and other options for treatment, the patient has consented to  Procedure(s): CYSTOSCOPY (N/A) [name] (TURP) (N/A) as a surgical intervention.  The patient's history has been reviewed, patient examined, no change in status, stable for surgery.  I have reviewed the patient's chart and labs.  Questions were answered to the patient's satisfaction.     [name]

## 2021-08-29 NOTE — Anesthesia Preprocedure Evaluation (Signed)
Anesthesia Evaluation  Patient identified by MRN, date of birth, ID band Patient awake    Reviewed: Allergy & Precautions, H&P , NPO status , Patient's Chart, lab work & pertinent test results, reviewed documented beta blocker date and time   Airway Mallampati: II  TM Distance: >3 FB Neck ROM: full    Dental no notable dental hx. (+) Edentulous Upper, Edentulous Lower   Pulmonary neg pulmonary ROS, former smoker,    Pulmonary exam normal breath sounds clear to auscultation       Cardiovascular Exercise Tolerance: Good negative cardio ROS   Rhythm:regular Rate:Normal     Neuro/Psych negative neurological ROS  negative psych ROS   GI/Hepatic Neg liver ROS, GERD  Medicated,  Endo/Other  negative endocrine ROS  Renal/GU negative Renal ROS  negative genitourinary   Musculoskeletal   Abdominal   Peds  Hematology negative hematology ROS (+)   Anesthesia Other Findings   Reproductive/Obstetrics negative OB ROS                             Anesthesia Physical Anesthesia Plan  ASA: 3  Anesthesia Plan: General and General LMA   Post-op Pain Management:    Induction:   PONV Risk Score and Plan: Ondansetron  Airway Management Planned:   Additional Equipment:   Intra-op Plan:   Post-operative Plan:   Informed Consent: I have reviewed the patients History and Physical, chart, labs and discussed the procedure including the risks, benefits and alternatives for the proposed anesthesia with the patient or authorized representative who has indicated his/her understanding and acceptance.     Dental Advisory Given  Plan Discussed with: CRNA  Anesthesia Plan Comments:         Anesthesia Quick Evaluation

## 2021-08-29 NOTE — Op Note (Signed)
Preoperative diagnosis: BPH with urinary retnetion  Postoperative diagnosis: BPH  Procedure: 1 cystoscopy 2. Transurethral resection of the prostate  Attending: [name]  Anesthesia: General  Estimated blood loss: Minimal  Drains: 22 French foley  Specimens: 1. Prostate Chips  Antibiotics: Rocephin  Findings: bilobar prostate enlargement. Ureteral orifices in normal anatomic location.   Indications: Patient is a 71 year old male with a history of BPH and urinary retention.  After discussing treatment options, they decided proceed with transurethral resection of the prostate.  Procedure in detail: The patient was brought to the operating room and a brief timeout was done to ensure correct patient, correct procedure, correct site.  General anesthesia was administered patient was placed in dorsal lithotomy position.  Their genitalia was then prepped and draped in usual sterile fashion.  A rigid 72 French cystoscope was passed in the urethra and the bladder.  Bladder was inspected and we noted no masses or lesions.  the ureteral orifices were in the normal orthotopic locations. removed the cystoscope and placed a resectoscope into the bladder. We then turned our attention to the prostate resection. Using the bipolar resectoscope we resected the median lobe first from the bladder neck to the verumontanum. We then started at the 12 oclock position on the left lobe and resection to the 6 o'clock position from the bladder neck to the verumontanum. We then did the same resection of the right lobe. Once the resection was complete we then cauterized individual bleeders. We then removed the prostate chips and sent them for pathology.  We then re-inspected the prostatic fossa and found no residual bleeding.  the bladder was then drained, a 22 French foley was placed and this concluded the procedure which was well tolerated by patient.  Complications: None  Condition: Stable, extubated,  transferred to PACU  Plan: Patient is admitted overnight with continuous bladder irrigation. If their urine is clear tomorrow they will be discharged home and followup in 5 days for foley catheter removal and pathology discussion.

## 2021-08-29 NOTE — Transfer of Care (Signed)
Immediate Anesthesia Transfer of Care Note  Patient: Alexander [name]  Procedure(s) Performed: CYSTOSCOPY (Ureter) TRANSURETHRAL RESECTION OF THE PROSTATE (TURP) (Prostate)  Patient Location: PACU  Anesthesia Type:General  Level of Consciousness: awake  Airway & Oxygen Therapy: Patient Spontanous Breathing  Post-op Assessment: Report given to RN  Post vital signs: Reviewed and stable  Last Vitals:  Vitals Value Taken Time  BP 126/69 08/29/21 0945  Temp    Pulse 67 08/29/21 0947  Resp 12 08/29/21 0947  SpO2 100 % 08/29/21 0947  Vitals shown include unvalidated device data.  Last Pain:  Vitals:   08/29/21 0749  TempSrc: Oral  PainSc: 2       Patients Stated Pain Goal: 8 (40/98/11 9147)  Complications: No notable events documented.

## 2021-08-30 ENCOUNTER — Ambulatory Visit (INDEPENDENT_AMBULATORY_CARE_PROVIDER_SITE_OTHER): Payer: Medicare Other | Admitting: Physician Assistant

## 2021-08-30 DIAGNOSIS — N401 Enlarged prostate with lower urinary tract symptoms: Secondary | ICD-10-CM | POA: Diagnosis not present

## 2021-08-30 DIAGNOSIS — Z466 Encounter for fitting and adjustment of urinary device: Secondary | ICD-10-CM

## 2021-08-30 LAB — BASIC METABOLIC PANEL
Anion gap: 7 (ref 5–15)
BUN: 36 mg/dL — ABNORMAL HIGH (ref 8–23)
CO2: 24 mmol/L (ref 22–32)
Calcium: 8.7 mg/dL — ABNORMAL LOW (ref 8.9–10.3)
Chloride: 109 mmol/L (ref 98–111)
Creatinine, Ser: 2.38 mg/dL — ABNORMAL HIGH (ref 0.61–1.24)
GFR, Estimated: 28 mL/min — ABNORMAL LOW (ref >60)
Glucose, Bld: 96 mg/dL (ref 70–99)
Potassium: 4.9 mmol/L (ref 3.5–5.1)
Sodium: 140 mmol/L (ref 135–145)

## 2021-08-30 LAB — CBC
HCT: 27.8 % — ABNORMAL LOW (ref 39.0–52.0)
Hemoglobin: 9.4 g/dL — ABNORMAL LOW (ref 13.0–17.0)
MCH: 31.6 pg (ref 26.0–34.0)
MCHC: 33.8 g/dL (ref 30.0–36.0)
MCV: 93.6 fL (ref 80.0–100.0)
Platelets: 320 10*3/uL (ref 150–400)
RBC: 2.97 MIL/uL — ABNORMAL LOW (ref 4.22–5.81)
RDW: 18.1 % — ABNORMAL HIGH (ref 11.5–15.5)
WBC: 3.7 10*3/uL — ABNORMAL LOW (ref 4.0–10.5)
nRBC: 0 % (ref 0.0–0.2)

## 2021-08-30 NOTE — Progress Notes (Unsigned)
Patient came in with complaints of catheter leaking.  Flushed catheter with 23mL of sterile water with clear return.  Changed catheter bag and checked that balloon was filled with 30cc of sterile water.  Patient will follow up with MD.  Levi Aland, CMA

## 2021-08-30 NOTE — Anesthesia Postprocedure Evaluation (Signed)
Anesthesia Post Note  Patient: Alexander [name]  Procedure(s) Performed: CYSTOSCOPY (Ureter) TRANSURETHRAL RESECTION OF THE PROSTATE (TURP) (Prostate)  Patient location during evaluation: Phase II Anesthesia Type: General Level of consciousness: awake Pain management: pain level controlled Vital Signs Assessment: post-procedure vital signs reviewed and stable Respiratory status: spontaneous breathing and respiratory function stable Cardiovascular status: blood pressure returned to baseline and stable Postop Assessment: no headache and no apparent nausea or vomiting Anesthetic complications: no Comments: Late entry   No notable events documented.   Last Vitals:  Vitals:   08/30/21 0158 08/30/21 0530  BP: 128/70 118/64  Pulse: 71 67  Resp: 16 16  Temp: 36.7 C 37 C  SpO2: 99% 99%    Last Pain:  Vitals:   08/29/21 2334  TempSrc: Oral  PainSc:                  Louann Sjogren

## 2021-08-30 NOTE — Discharge Summary (Signed)
Physician Discharge Summary   Patient: Alexander [name] MRN: [id number] DOB: 26-Sep-1950  Admit date:     08/29/2021  Discharge date: 08/30/21  Discharge Physician: Michaelle Birks   PCP: Lemmie Evens, MD   Discharge Diagnoses: Principal Problem:   BPH with urinary obstruction  Hospital Course: Patient is a 71 year old male with history of BPH and urinary retention who was admitted on 08/29/2021 for surgical management with a transurethral resection of the prostate.  He was taken to the operating room on 08/29/2021 where he underwent cystoscopy and TURP.  Details of the procedure may be found in the operative note located in the patient's chart.  The patient's postoperative course was unremarkable as he remained afebrile and hemodynamically stable.  He was continued on bladder irrigation overnight and his urine remained clear.  He was tolerating a regular diet and his pain was well controlled.  His Foley was draining well on postoperative day #1.  He was felt to be stable for discharge home at that time.  Assessment and Plan: BPH with obstruction Urinary retention  Discharge to home with Foley catheter in place. Follow-up in the office in 4-5 days for a voiding trial.   Consultants: None Procedures performed: Cystoscopy, transurethral resection of prostate 08/29/2021 Disposition: Home Diet recommendation:  Discharge Diet Orders (From admission, onward)     Start     Ordered   08/30/21 0000  Diet general        08/30/21 1022            DISCHARGE MEDICATION: Allergies as of 08/30/2021   No Known Allergies      Medication List     TAKE these medications    GEMZAR IV Inject into the vein once a week. Days 1 & 8 q 21 days   HYDROcodone-acetaminophen 10-325 MG tablet Commonly known as: NORCO Take 1 tablet by mouth every 12 (twelve) hours as needed. What changed: reasons to take this   levothyroxine 88 MCG tablet Commonly known as: SYNTHROID TAKE ONE TABLET BY MOUTH  DAILY BEFORE BREAKFAST What changed:  how much to take how to take this when to take this   naproxen sodium 220 MG tablet Commonly known as: ALEVE Take 220 mg by mouth daily as needed (pain.).   omeprazole 20 MG capsule Commonly known as: PRILOSEC TAKE 1 CAPSULE BY MOUTH 30 MINUTES PRIOR TO BREAKFAST What changed: See the new instructions.   tadalafil 20 MG tablet Commonly known as: CIALIS Take 1 tablet (20 mg total) by mouth daily as needed.   tamsulosin 0.4 MG Caps capsule Commonly known as: FLOMAX Take 1 capsule (0.4 mg total) by mouth 2 (two) times daily.        Follow-up Information     McKenzie, Candee Furbish, MD Follow up in 5 day(s).   Specialty: Urology Contact information: [address]  [place] [address] [phone]                 Discharge Exam: Danley Danker Weights   08/29/21 0749  Weight: 59.7 kg   GENERAL APPEARANCE:  Well appearing, well developed, well nourished, NAD HEENT:  Atraumatic, normocephalic, oropharynx clear NECK:  Supple without lymphadenopathy or thyromegaly ABDOMEN:  Soft, non-tender, no masses EXTREMITIES:  Moves all extremities well, without clubbing, cyanosis, or edema NEUROLOGIC:  Alert and oriented x 3, normal gait, CN II-XII grossly intact MENTAL STATUS:  appropriate BACK:  Non-tender to palpation, No CVAT SKIN:  Warm, dry, and intact   Condition  at discharge: good  The results of significant diagnostics from this hospitalization (including imaging, microbiology, ancillary and laboratory) are listed below for reference.   Imaging Studies: No results found.  Microbiology: Results for orders placed or performed during the hospital encounter of 08/20/21  Urine Culture     Status: Abnormal   Collection Time: 08/20/21 12:58 PM   Specimen: Urine, Catheterized  Result Value Ref Range Status   Specimen Description   Final    URINE, CATHETERIZED Performed at [facility], [address], [place],  [place] [address]    Special Requests   Final    NONE Performed at [facility], [address], [place], [place] [address]    Culture (A)  Final    >=100,000 COLONIES/mL PSEUDOMONAS AERUGINOSA >=100,000 COLONIES/mL ENTEROCOCCUS FAECALIS    Report Status 08/24/2021 FINAL  Final   Organism ID, Bacteria PSEUDOMONAS AERUGINOSA (A)  Final   Organism ID, Bacteria ENTEROCOCCUS FAECALIS (A)  Final      Susceptibility   Enterococcus faecalis - MIC*    AMPICILLIN <=2 SENSITIVE Sensitive     NITROFURANTOIN <=16 SENSITIVE Sensitive     VANCOMYCIN 2 SENSITIVE Sensitive     * >=100,000 COLONIES/mL ENTEROCOCCUS FAECALIS   Pseudomonas aeruginosa - MIC*    CEFTAZIDIME 2 SENSITIVE Sensitive     CIPROFLOXACIN <=0.25 SENSITIVE Sensitive     GENTAMICIN 2 SENSITIVE Sensitive     IMIPENEM 2 SENSITIVE Sensitive     PIP/TAZO 8 SENSITIVE Sensitive     CEFEPIME 2 SENSITIVE Sensitive     * >=100,000 COLONIES/mL PSEUDOMONAS AERUGINOSA    Labs: CBC: Recent Labs  Lab 08/25/21 1235 08/30/21 0457  WBC 4.5 3.7*  NEUTROABS 2.8  --   HGB 9.6* 9.4*  HCT 29.4* 27.8*  MCV 94.2 93.6  PLT 181 898   Basic Metabolic Panel: Recent Labs  Lab 08/25/21 1235 08/30/21 0457  NA 138 140  K 4.5 4.9  CL 109 109  CO2 23 24  GLUCOSE 88 96  BUN 37* 36*  CREATININE 2.29* 2.38*  CALCIUM 8.9 8.7*  MG 2.1  --    Liver Function Tests: Recent Labs  Lab 08/25/21 1235  AST 20  ALT 17  ALKPHOS 55  BILITOT 0.4  PROT 7.0  ALBUMIN 3.8   CBG: No results for input(s): GLUCAP in the last 168 hours.  Discharge time spent: less than 30 minutes.  Signed: Michaelle Birks, MD 08/30/2021

## 2021-08-30 NOTE — TOC Progression Note (Signed)
Transition of Care [facility]) - Progression Note    Patient Details  Name: Alexander [name] MRN: [id number] Date of Birth: January 10, 1951  Transition of Care [facility]) CM/SW Contact  Salome Arnt, [place] Phone Number: 08/30/2021, 11:09 AM  Clinical Narrative:   Transition of Care [facility]) Screening Note   Patient Details  Name: Alexander [name] Date of Birth: 1951-03-03   Transition of Care [facility]) CM/SW Contact:    Salome Arnt, [place] Phone Number: 08/30/2021, 11:09 AM    Transition of Care Department [facility]) has reviewed patient and no TOC needs have been identified at this time. We will continue to monitor patient advancement through interdisciplinary progression rounds. If new patient transition needs arise, please place a TOC consult.         Barriers to Discharge: Continued Medical Work up  Expected Discharge Plan and Services           Expected Discharge Date: 08/30/21                                     Social Determinants of Health (SDOH) Interventions    Readmission Risk Interventions     View : No data to display.

## 2021-08-31 LAB — SURGICAL PATHOLOGY

## 2021-09-01 ENCOUNTER — Inpatient Hospital Stay (HOSPITAL_COMMUNITY): Payer: Medicare Other

## 2021-09-01 ENCOUNTER — Inpatient Hospital Stay (HOSPITAL_BASED_OUTPATIENT_CLINIC_OR_DEPARTMENT_OTHER): Payer: Medicare Other | Admitting: Hematology

## 2021-09-01 ENCOUNTER — Other Ambulatory Visit (HOSPITAL_COMMUNITY): Payer: Self-pay | Admitting: *Deleted

## 2021-09-01 VITALS — BP 131/68 | HR 74 | Temp 97.6°F | Resp 18

## 2021-09-01 DIAGNOSIS — C61 Malignant neoplasm of prostate: Secondary | ICD-10-CM | POA: Diagnosis not present

## 2021-09-01 DIAGNOSIS — Z5111 Encounter for antineoplastic chemotherapy: Secondary | ICD-10-CM | POA: Diagnosis not present

## 2021-09-01 DIAGNOSIS — C109 Malignant neoplasm of oropharynx, unspecified: Secondary | ICD-10-CM

## 2021-09-01 DIAGNOSIS — C3492 Malignant neoplasm of unspecified part of left bronchus or lung: Secondary | ICD-10-CM

## 2021-09-01 LAB — COMPREHENSIVE METABOLIC PANEL
ALT: 14 U/L (ref 0–44)
AST: 19 U/L (ref 15–41)
Albumin: 3.7 g/dL (ref 3.5–5.0)
Alkaline Phosphatase: 52 U/L (ref 38–126)
Anion gap: 9 (ref 5–15)
BUN: 36 mg/dL — ABNORMAL HIGH (ref 8–23)
CO2: 24 mmol/L (ref 22–32)
Calcium: 9.1 mg/dL (ref 8.9–10.3)
Chloride: 105 mmol/L (ref 98–111)
Creatinine, Ser: 2.09 mg/dL — ABNORMAL HIGH (ref 0.61–1.24)
GFR, Estimated: 33 mL/min — ABNORMAL LOW (ref >60)
Glucose, Bld: 122 mg/dL — ABNORMAL HIGH (ref 70–99)
Potassium: 4.7 mmol/L (ref 3.5–5.1)
Sodium: 138 mmol/L (ref 135–145)
Total Bilirubin: 0.4 mg/dL (ref 0.3–1.2)
Total Protein: 6.8 g/dL (ref 6.5–8.1)

## 2021-09-01 LAB — CBC WITH DIFFERENTIAL/PLATELET
Abs Immature Granulocytes: 0.01 10*3/uL (ref 0.00–0.07)
Basophils Absolute: 0 10*3/uL (ref 0.0–0.1)
Basophils Relative: 0 %
Eosinophils Absolute: 0.1 10*3/uL (ref 0.0–0.5)
Eosinophils Relative: 3 %
HCT: 26.2 % — ABNORMAL LOW (ref 39.0–52.0)
Hemoglobin: 8.7 g/dL — ABNORMAL LOW (ref 13.0–17.0)
Immature Granulocytes: 0 %
Lymphocytes Relative: 18 %
Lymphs Abs: 0.6 10*3/uL — ABNORMAL LOW (ref 0.7–4.0)
MCH: 31.2 pg (ref 26.0–34.0)
MCHC: 33.2 g/dL (ref 30.0–36.0)
MCV: 93.9 fL (ref 80.0–100.0)
Monocytes Absolute: 0.2 10*3/uL (ref 0.1–1.0)
Monocytes Relative: 5 %
Neutro Abs: 2.5 10*3/uL (ref 1.7–7.7)
Neutrophils Relative %: 74 %
Platelets: 231 10*3/uL (ref 150–400)
RBC: 2.79 MIL/uL — ABNORMAL LOW (ref 4.22–5.81)
RDW: 18 % — ABNORMAL HIGH (ref 11.5–15.5)
WBC: 3.4 10*3/uL — ABNORMAL LOW (ref 4.0–10.5)
nRBC: 0 % (ref 0.0–0.2)

## 2021-09-01 LAB — MAGNESIUM: Magnesium: 1.8 mg/dL (ref 1.7–2.4)

## 2021-09-01 MED ORDER — SODIUM CHLORIDE 0.9 % IV SOLN: Freq: Once | INTRAVENOUS | Status: AC

## 2021-09-01 MED ORDER — HYDROMORPHONE HCL 1 MG/ML IJ SOLN: 2.0000 mg | Freq: Once | INTRAMUSCULAR | Status: DC

## 2021-09-01 MED ORDER — HYDROCODONE-ACETAMINOPHEN 10-325 MG PO TABS: 1.0000 | ORAL_TABLET | Freq: Two times a day (BID) | ORAL | 0 refills | Status: DC | PRN

## 2021-09-01 MED ORDER — SODIUM CHLORIDE 0.9 % IV SOLN
750.0000 mg/m2 | Freq: Once | INTRAVENOUS | Status: AC
  Administered 2021-09-01: 1292 mg via INTRAVENOUS
  Filled 2021-09-01: qty 33.98

## 2021-09-01 MED ORDER — HEPARIN SOD (PORK) LOCK FLUSH 100 UNIT/ML IV SOLN
500.0000 [IU] | Freq: Once | INTRAVENOUS | Status: AC | PRN
  Administered 2021-09-01: 500 [IU]

## 2021-09-01 MED ORDER — HYDROMORPHONE HCL 1 MG/ML IJ SOLN
2.0000 mg | Freq: Once | INTRAMUSCULAR | Status: AC
  Administered 2021-09-01: 2 mg via INTRAVENOUS

## 2021-09-01 MED ORDER — SODIUM CHLORIDE 0.9% FLUSH
10.0000 mL | INTRAVENOUS | Status: DC | PRN
  Administered 2021-09-01: 10 mL

## 2021-09-01 MED ORDER — PALONOSETRON HCL INJECTION 0.25 MG/5ML
0.2500 mg | Freq: Once | INTRAVENOUS | Status: AC
  Administered 2021-09-01: 0.25 mg via INTRAVENOUS
  Filled 2021-09-01: qty 5

## 2021-09-01 NOTE — Progress Notes (Signed)
Patient has been examined by Dr. Katragadda, and vital signs and labs have been reviewed. ANC, Creatinine, LFTs, hemoglobin, and platelets are within treatment parameters per M.D. - pt may proceed with treatment.    °

## 2021-09-01 NOTE — Progress Notes (Signed)
Patient tolerated chemotherapy with no complaints voiced.  Side effects with management reviewed with understanding verbalized.  Port site clean and dry with no bruising or swelling noted at site.  Good blood return noted before and after administration of chemotherapy.  Band aid applied.  Patient left in satisfactory condition with VSS and no s/s of distress noted.   

## 2021-09-01 NOTE — Patient Instructions (Signed)
[place] at [facility] Discharge Instructions   You were seen and examined today by Dr. Delton Coombes.  He reviewed the results of your lab work which is normal/stable.   We will arrange for a CT scan after you complete cycle 4.   Return as scheduled.    Thank you for choosing [place] at [facility] to provide your oncology and hematology care.  To afford each patient quality time with our provider, please arrive at least 15 minutes before your scheduled appointment time.   If you have a lab appointment with the [facility] please come in thru the Main Entrance and check in at the main information desk.  You need to re-schedule your appointment should you arrive 10 or more minutes late.  We strive to give you quality time with our providers, and arriving late affects you and other patients whose appointments are after yours.  Also, if you no show three or more times for appointments you may be dismissed from the clinic at the providers discretion.     Again, thank you for choosing [facility].  Our hope is that these requests will decrease the amount of time that you wait before being seen by our physicians.       _____________________________________________________________  Should you have questions after your visit to [facility], please contact our office at [phone] and follow the prompts.  Our office hours are 8:00 a.m. and 4:30 p.m. Monday - Friday.  Please note that voicemails left after 4:00 p.m. may not be returned until the following business day.  We are closed weekends and major holidays.  You do have access to a nurse 24-7, just call the main number to the clinic [phone] and do not press any options, hold on the line and a nurse will answer the phone.    For prescription refill requests, have your pharmacy contact our office and allow 72 hours.    Due to Covid, you will need to wear a mask  upon entering the hospital. If you do not have a mask, a mask will be given to you at the Main Entrance upon arrival. For doctor visits, patients may have 1 support person age 22 or older with them. For treatment visits, patients can not have anyone with them due to social distancing guidelines and our immunocompromised population.

## 2021-09-01 NOTE — Progress Notes (Signed)
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   07/28/2014 Initial Diagnosis   Oropharyngeal cancer   08/03/2014 Imaging   CT CAP- L supraclavicular lymphadenopathy is not completely visualized. This is better seen on the previous neck CT from 07/27/2014. Otherwise, no evidence for metastatic disease in the chest, abdomen, or pelvis.   08/03/2014 Imaging   Bone scan- Uptake at adjacent anterior LEFT 6, 7, 8 ribs likely representing trauma/fractures. Questionable nonspecific increased tracer localization at the posterior RIGHT 8th and 9th ribs, the adjacent nature which raises a a question of trauma as well   08/06/2014 Pathology Results   Dr. Benjamine Mola- Oropharynx, biopsy, Left - INVASIVE SQUAMOUS CELL CARCINOMA.   08/12/2014 Procedure   Dr. Enrique Sack- 1. Multiple extraction of tooth numbers 6, 17, 22, 23, 24, 25, 26, and 27. 3 Quadrants of alveoloplasty   08/17/2014 Pathology Results   PORT and G-TUBE placed by Dr. Carlis Stable.   08/26/2014 PET scan   Large hypermetabolic mass in the left base of tongue. Activity extends across midline to the right base tongue. 2. Intensely hypermetabolic left cervical metastatic lymph nodes. Lymph nodes extend from the left level II position to the left supraclavi   09/01/2014 - 09/22/2014 Chemotherapy   Concurrent chemoradiation with Cisplatin 100 mg/m2 x 2 cycles with Neulasta  support. Held cycle #3 d/t renal toxicity.    09/03/2014 - 10/23/2014 Radiation Therapy   Treated in [place], IMRT Isidore Moos).  Base of tongue and bilat neck. Total dose: 70 Gy in 35 fractions. (of note, he did miss several treatments requiring BID dosing towards the end of treatment).    01/25/2015 PET scan   Near complete resolution of metabolic activity at the base of tongue. Minimal residual activity is likely post treatment effect. 2. Complete resolution of metabolic activity above LEFT cervical lymph nodes. No evidence of residual metabolically active    6/57/8469 Procedure   Port-a-cath removed [name])    05/03/2018 - 08/23/2018 Chemotherapy   The patient had dexamethasone (DECADRON) 4 MG tablet, 8 mg, Oral, Daily, 1 of 1 cycle, Start date: 05/01/2018, End date: 10/23/2018 palonosetron (ALOXI) injection 0.25 mg, 0.25 mg, Intravenous,  Once, 6 of 6 cycles Administration: 0.25 mg (05/03/2018), 0.25 mg (05/24/2018), 0.25 mg (06/14/2018), 0.25 mg (07/09/2018), 0.25 mg (07/30/2018), 0.25 mg (08/21/2018) pegfilgrastim-cbqv (UDENYCA) injection 6 mg, 6 mg, Subcutaneous, Once, 5 of 5 cycles Administration: 6 mg (05/27/2018), 6 mg (06/17/2018), 6 mg (07/11/2018), 6 mg (08/01/2018), 6 mg (08/23/2018) CARBOplatin (PARAPLATIN) 380 mg in sodium chloride 0.9 % 250 mL chemo infusion, 380 mg (100 % of original dose 381 mg), Intravenous,  Once, 6 of 6 cycles Dose modification:   (original dose 381 mg, Cycle 1),   (original dose 309.5 mg, Cycle 2), 307.5 mg (original dose 309.5 mg, Cycle 5) Administration: 380 mg (05/03/2018), 310 mg (05/24/2018), 310 mg (06/14/2018), 340 mg (07/09/2018), 310 mg (07/30/2018), 350 mg (08/21/2018) PACLitaxel (TAXOL) 330 mg in sodium chloride 0.9 %  500 mL chemo infusion (> 50m/m2), 175 mg/m2 = 330 mg (100 % of original dose 175 mg/m2), Intravenous,  Once, 6 of 6 cycles Dose modification: 175 mg/m2 (original dose 175 mg/m2, Cycle 1, Reason: Patient Age) Administration: 330 mg (05/03/2018), 330 mg (05/24/2018),  330 mg (06/14/2018), 330 mg (07/09/2018), 330 mg (07/30/2018), 330 mg (08/21/2018)  for chemotherapy treatment.    05/24/2018 - 05/06/2021 Chemotherapy   Patient is on Treatment Plan : HEAD/NECK Pembrolizumab/Taxol/Carboplatin Q21D     07/05/2021 -  Chemotherapy   Patient is on Treatment Plan : LUNG Gemcitabine D1,8 q21d     Squamous [place] lung cancer, [place] ([facility]  06/14/2018 Initial Diagnosis   Squamous cell lung cancer, left ([facility])     CANCER STAGING:  Cancer Staging  Oropharyngeal carcinoma (HCC) Staging form: Pharynx - Oropharynx, AJCC 7th Edition - Clinical: Stage IVA (T4a, N2b, M0) - Unsigned   INTERVAL HISTORY:  Alexander [name] a 72y.o. male, returns for routine follow-up and consideration for next cycle of chemotherapy. CPleaswas last seen on 08/11/2021.  Due for day #8 cycle #3 of Gemcitabine today.   Overall, he tells me he has been feeling pretty well. He reports abdominal pain.   Overall, he feels ready for next cycle of chemo today.    REVIEW OF SYSTEMS:  Review of Systems  Constitutional:  Negative for appetite change and fatigue.  Gastrointestinal:  Positive for abdominal pain (9/10).  Genitourinary:  Positive for dysuria and hematuria.   Musculoskeletal:  Positive for arthralgias.  Psychiatric/Behavioral:  Positive for depression and sleep disturbance.   All other systems reviewed and are negative.   PAST MEDICAL/SURGICAL HISTORY:  Past Medical History:  Diagnosis Date   GERD (gastroesophageal reflux disease)    Mass of neck    dx. oropharyngeal squamous cell carcinoma- Chemo. radiation planned   [place] ([facility] 07/28/2014   dx. 3 weeks ago.- [facility] [place]   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 08/06/2014   SCCa of Left BOT   Past Surgical History:  Procedure Laterality Date   BIOPSY  01/15/2018   Procedure: BIOPSY;  Surgeon: FDanie Binder MD;  [facility];  Service: Endoscopy;;  gastric    COLONOSCOPY N/A 03/13/2016   Procedure: COLONOSCOPY;  Surgeon: SDanie Binder MD;  [facility];  Service: Endoscopy;  Laterality: N/A;  2:15 PM   CYSTOSCOPY WITH INSERTION OF UROLIFT N/A 06/27/2021   Procedure: CYSTOSCOPY WITH INSERTION OF UROLIFT;  Surgeon: MCleon Gustin MD;  Location: [facility];  Service: Urology;  Laterality: N/A;   ESOPHAGOGASTRODUODENOSCOPY (EGD) WITH PROPOFOL N/A 08/17/2014   Procedure: ESOPHAGOGASTRODUODENOSCOPY (EGD) WITH PROPOFOL (procedure #1);  Surgeon: MAviva SignsMd, MD;  Location: [facility];  Service: General;  Laterality: N/A;   ESOPHAGOGASTRODUODENOSCOPY (EGD) WITH PROPOFOL N/A 01/15/2018   Procedure: ESOPHAGOGASTRODUODENOSCOPY (EGD) WITH PROPOFOL;  Surgeon: FDanie Binder MD;  [facility];  Service: Endoscopy;  Laterality: N/A;  9:30am   MULTIPLE EXTRACTIONS WITH ALVEOLOPLASTY N/A 08/12/2014   Procedure: Extraction of tooth #'s 6,17,22,23,24,25,26,27 with alveoloplasty;  Surgeon: RLenn Cal DDS;  Location: [facility];  Service: Oral Surgery;  Laterality: N/A;   PANENDOSCOPY N/A 08/06/2014   Procedure: PANENDOSCOPY WITH BIOPSY;  Surgeon: SLeta Baptist MD;  [facility]  Service: ENT;  Laterality: N/A;   PEG PLACEMENT Left 08/17/14   PEG PLACEMENT N/A 08/17/2014   Procedure: PERCUTANEOUS ENDOSCOPIC GASTROSTOMY (PEG) PLACEMENT (procedure #1);  Surgeon:  Aviva Signs Md, MD;  Location: [facility];  Service: General;  Laterality: N/A;   PORT-A-CATH REMOVAL Right 07/17/2016   Procedure: MINOR REMOVAL PORT-A-CATH;  Surgeon: Aviva Signs, MD;  Location: [facility];  Service: General;  Laterality: Right;   PORTACATH PLACEMENT Right 08/17/14   PORTACATH PLACEMENT Right 08/17/2014   Procedure: INSERTION PORT-A-CATH (procedure #2);  Surgeon: Aviva Signs Md, MD;  Location: [facility];  Service: General;  Laterality: Right;   PORTACATH PLACEMENT Left 04/26/2018   Procedure: INSERTION PORT-A-CATH (attached catheter in left subclavian);  Surgeon:  Aviva Signs, MD;  Location: [facility];  Service: General;  Laterality: Left;   SAVORY DILATION N/A 01/15/2018   Procedure: SAVORY DILATION;  Surgeon: Danie Binder, MD;  [facility];  Service: Endoscopy;  Laterality: N/A;   VIDEO BRONCHOSCOPY WITH ENDOBRONCHIAL ULTRASOUND N/A 04/15/2018   Procedure: VIDEO BRONCHOSCOPY WITH ENDOBRONCHIAL ULTRASOUND;  Surgeon: Melrose Nakayama, MD;  [facility];  Service: Thoracic;  Laterality: N/A;    SOCIAL HISTORY:  Social History   Socioeconomic History   Marital status: Legally Separated    Spouse name: Not on file   Number of children: 5   Years of education: Not on file   Highest education level: Not on file  Occupational History   Not on file  Tobacco Use   Smoking status: Former    Packs/day: 0.50    Years: 30.00    Total pack years: 15.00    Types: Cigarettes    Quit date: 07/22/2014    Years since quitting: 7.1   Smokeless tobacco: Never  Vaping Use   Vaping Use: Never used  Substance and Sexual Activity   Alcohol use: Not Currently    Alcohol/week: 0.0 standard drinks of alcohol    Comment: None currently (11/09/17); previously 1-2 beers on the weekend   Drug use: No   Sexual activity: Yes  Other Topics Concern   Not on file  Social History Narrative   Not on file   Social Determinants of Health   Financial Resource Strain: Not on file  Food Insecurity: Not on file  Transportation Needs: Not on file  Physical Activity: Inactive (02/12/2020)   Exercise Vital Sign    Days of Exercise per Week: 0 days    Minutes of Exercise per Session: 0 min  Stress: Not on file  Social Connections: Moderately Isolated (02/12/2020)   Social Connection and Isolation Panel [NHANES]    Frequency of Communication with Friends and Family: More than three times a week    Frequency of Social Gatherings with Friends and Family: Twice a week    Attends Religious Services: Never    Marine scientist or Organizations: No     Attends Music therapist: Never    Marital Status: Married  Human resources officer Violence: Not on file    FAMILY HISTORY:  Family History  Problem Relation Age of Onset   Colon cancer Neg Hx    Gastric cancer Neg Hx    Esophageal cancer Neg Hx     CURRENT MEDICATIONS:  Current Outpatient Medications  Medication Sig Dispense Refill   Gemcitabine HCl (GEMZAR IV) Inject into the vein once a week. Days 1 & 8 q 21 days     HYDROcodone-acetaminophen (NORCO) 10-325 MG tablet Take 1 tablet by mouth every 12 (twelve) hours as needed. (Patient taking differently: Take 1 tablet by mouth every 12 (twelve) hours as needed for moderate pain.) 60 tablet 0   levothyroxine (  SYNTHROID) 88 MCG tablet TAKE ONE TABLET BY MOUTH DAILY BEFORE BREAKFAST (Patient taking differently: Take 88 mcg by mouth daily before breakfast. TAKE ONE TABLET BY MOUTH DAILY BEFORE BREAKFAST) 30 tablet 2   naproxen sodium (ALEVE) 220 MG tablet Take 220 mg by mouth daily as needed (pain.).     omeprazole (PRILOSEC) 20 MG capsule TAKE 1 CAPSULE BY MOUTH 30 MINUTES PRIOR TO BREAKFAST (Patient taking differently: Take 20 mg by mouth daily. TAKE 1 CAPSULE BY MOUTH 30 MINUTES PRIOR TO BREAKFAST) 90 capsule 0   tadalafil (CIALIS) 20 MG tablet Take 1 tablet (20 mg total) by mouth daily as needed. 10 tablet 5   tamsulosin (FLOMAX) 0.4 MG CAPS capsule Take 1 capsule (0.4 mg total) by mouth 2 (two) times daily. 60 capsule 11   No current facility-administered medications for this visit.    ALLERGIES:  No Known Allergies  PHYSICAL EXAM:  Performance status (ECOG): 1 - Symptomatic but completely ambulatory  There were no vitals filed for this visit. Wt Readings from Last 3 Encounters:  08/29/21 131 lb 9.8 oz (59.7 kg)  08/25/21 131 lb 9.6 oz (59.7 kg)  08/20/21 133 lb 3 oz (60.4 kg)   Physical Exam Vitals reviewed.  Constitutional:      Appearance: Normal appearance.  Cardiovascular:     Rate and Rhythm: Normal rate and  regular rhythm.     Pulses: Normal pulses.     Heart sounds: Normal heart sounds.  Pulmonary:     Effort: Pulmonary effort is normal.     Breath sounds: Normal breath sounds.  Neurological:     General: No focal deficit present.     Mental Status: He is alert and oriented to person, place, and time.  Psychiatric:        Mood and Affect: Mood normal.        Behavior: Behavior normal.     LABORATORY DATA:  I have reviewed the labs as listed.     Latest Ref Rng & Units 08/30/2021    4:57 AM 08/25/2021   12:35 PM 08/11/2021    8:45 AM  CBC  WBC 4.0 - 10.5 K/uL 3.7  4.5  5.5   Hemoglobin 13.0 - 17.0 g/dL 9.4  9.6  10.7   Hematocrit 39.0 - 52.0 % 27.8  29.4  32.3   Platelets 150 - 400 K/uL 320  181  181       Latest Ref Rng & Units 08/30/2021    4:57 AM 08/25/2021   12:35 PM 08/11/2021    8:45 AM  CMP  Glucose 70 - 99 mg/dL 96  88  124   BUN 8 - 23 mg/dL 36  37  29   Creatinine 0.61 - 1.24 mg/dL 2.38  2.29  2.32   Sodium 135 - 145 mmol/L 140  138  142   Potassium 3.5 - 5.1 mmol/L 4.9  4.5  4.3   Chloride 98 - 111 mmol/L 109  109  113   CO2 22 - 32 mmol/L _0 Calcium 8.9 - 10.3 mg/dL 8.7  8.9  8.8   Total Protein 6.5 - 8.1 g/dL  7.0  7.3   Total Bilirubin 0.3 - 1.2 mg/dL  0.4  0.4   Alkaline Phos 38 - 126 U/L  55  65   AST 15 - 41 U/L  20  21   ALT 0 - 44 U/L  17  18     DIAGNOSTIC  IMAGING:  I have independently reviewed the scans and discussed with the patient. No results found.   ASSESSMENT:  1.  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 -6 cycles of carboplatin, paclitaxel and pembrolizumab from 05/03/2018 through 08/21/2018. -Maintenance pembrolizumab started on 09/11/2018. -PET scan on 09/15/2019 showed interval decrease in hypermetabolic areas associated with tongue and floor of the mouth.  Hypermetabolic metastatic lymphadenopathy in the chest is stable.  No new sites seen. -PET scan  on 03/15/2020 shows persistent, stable hypermetabolism in the tongue/floor of mouth.  Slight interval decrease in hypermetabolism with mediastinal/hilar adenopathy.  Persistent hypermetabolic focus in the right supraclavicular region.  New focus of hypermetabolic them identified in the right external iliac chain of pelvis with no discernible adenopathy on the CT. -CT CAP on 11/24/2020 showed left lower lobe lung nodule measuring 1.3 x 1.1 cm, previously 1.0 x 0.9 cm on CT scan from June.  Lesion was negative on PSMA PET scan.  Stable mediastinal lymph nodes. 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 - 6 cycles of carboplatin, paclitaxel and pembrolizumab from 01/12/2021 through 05/04/2021 with progression. - NGS test: No targetable mutations.  PD-L1 TPS is negative.  MSI-stable.  TMB-low. - Gemcitabine 2 weeks on/1 week off started on 07/05/2021.   2.  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  3.  Prostate cancer: - Evaluated by Dr. Alyson Ingles and biopsy was recommended.  Patient declined.  4.  Left kidney mass: - Biopsy of the left kidney mass was consistent with metastatic squamous cell carcinoma.   PLAN:  1.  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 - He is tolerating gemcitabine very well. - Reviewed labs today which showed normal LFTs and grossly normal CBC with anemia with hemoglobin 8.7 likely from myelosuppression and bleeding. - Proceed with cycle 3-day 8 without any dose modifications.  I will see him after cycle 4 and 5 weeks with CT CAP and other labs.   2.  Hypothyroidism: - Continue Synthroid 88 mcg daily.  Last TSH was 4.1.   3.  Bilateral knee pains: - He is requiring hydrocodone slightly more since he had TURP done which  is causing suprapubic pain.  Previously was taking hydrocodone twice daily for knee pains.   4.  Nutrition: - Continue Ensure 350 cal twice daily.  Weight stable.   5.  CKD: - Creatinine slightly improved to 2.09 after TURP.  6.  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 - He has a follow-up appointment with Dr. Alyson Ingles.   Orders placed this encounter:  No orders of the defined types were placed in this encounter.    Derek Jack, MD [place] [phone]   I, Thana Ates, am acting as a scribe for Dr. Derek Jack.  I, Derek Jack MD, have reviewed the above documentation for accuracy and completeness, and I agree with the above.

## 2021-09-05 ENCOUNTER — Encounter (HOSPITAL_COMMUNITY): Payer: Self-pay | Admitting: Urology

## 2021-09-08 ENCOUNTER — Ambulatory Visit: Payer: Medicare Other | Admitting: Physician Assistant

## 2021-09-15 ENCOUNTER — Inpatient Hospital Stay (HOSPITAL_COMMUNITY): Payer: Medicare Other

## 2021-09-15 VITALS — BP 119/63 | HR 70 | Temp 98.0°F | Resp 18 | Wt 132.2 lb

## 2021-09-15 DIAGNOSIS — C3492 Malignant neoplasm of unspecified part of left bronchus or lung: Secondary | ICD-10-CM

## 2021-09-15 DIAGNOSIS — C109 Malignant neoplasm of oropharynx, unspecified: Secondary | ICD-10-CM

## 2021-09-15 DIAGNOSIS — Z5111 Encounter for antineoplastic chemotherapy: Secondary | ICD-10-CM | POA: Diagnosis not present

## 2021-09-15 LAB — COMPREHENSIVE METABOLIC PANEL WITH GFR
ALT: 17 U/L (ref 0–44)
AST: 17 U/L (ref 15–41)
Albumin: 3.4 g/dL — ABNORMAL LOW (ref 3.5–5.0)
Alkaline Phosphatase: 54 U/L (ref 38–126)
Anion gap: 7 (ref 5–15)
BUN: 32 mg/dL — ABNORMAL HIGH (ref 8–23)
CO2: 25 mmol/L (ref 22–32)
Calcium: 8.7 mg/dL — ABNORMAL LOW (ref 8.9–10.3)
Chloride: 110 mmol/L (ref 98–111)
Creatinine, Ser: 2.42 mg/dL — ABNORMAL HIGH (ref 0.61–1.24)
GFR, Estimated: 28 mL/min — ABNORMAL LOW
Glucose, Bld: 94 mg/dL (ref 70–99)
Potassium: 4.8 mmol/L (ref 3.5–5.1)
Sodium: 142 mmol/L (ref 135–145)
Total Bilirubin: 0.1 mg/dL — ABNORMAL LOW (ref 0.3–1.2)
Total Protein: 6.7 g/dL (ref 6.5–8.1)

## 2021-09-15 LAB — CBC WITH DIFFERENTIAL/PLATELET
Abs Immature Granulocytes: 0.01 10*3/uL (ref 0.00–0.07)
Basophils Absolute: 0 10*3/uL (ref 0.0–0.1)
Basophils Relative: 0 %
Eosinophils Absolute: 0.1 10*3/uL (ref 0.0–0.5)
Eosinophils Relative: 3 %
HCT: 27.5 % — ABNORMAL LOW (ref 39.0–52.0)
Hemoglobin: 9 g/dL — ABNORMAL LOW (ref 13.0–17.0)
Immature Granulocytes: 0 %
Lymphocytes Relative: 20 %
Lymphs Abs: 1 10*3/uL (ref 0.7–4.0)
MCH: 31.7 pg (ref 26.0–34.0)
MCHC: 32.7 g/dL (ref 30.0–36.0)
MCV: 96.8 fL (ref 80.0–100.0)
Monocytes Absolute: 0.5 10*3/uL (ref 0.1–1.0)
Monocytes Relative: 11 %
Neutro Abs: 3.1 10*3/uL (ref 1.7–7.7)
Neutrophils Relative %: 66 %
Platelets: 295 10*3/uL (ref 150–400)
RBC: 2.84 MIL/uL — ABNORMAL LOW (ref 4.22–5.81)
RDW: 20.8 % — ABNORMAL HIGH (ref 11.5–15.5)
WBC: 4.7 10*3/uL (ref 4.0–10.5)
nRBC: 0 % (ref 0.0–0.2)

## 2021-09-15 LAB — MAGNESIUM: Magnesium: 2.2 mg/dL (ref 1.7–2.4)

## 2021-09-15 MED ORDER — SODIUM CHLORIDE 0.9 % IV SOLN: Freq: Once | INTRAVENOUS | Status: AC

## 2021-09-15 MED ORDER — SODIUM CHLORIDE 0.9 % IV SOLN
750.0000 mg/m2 | Freq: Once | INTRAVENOUS | Status: AC
  Administered 2021-09-15: 1292 mg via INTRAVENOUS
  Filled 2021-09-15: qty 33.98

## 2021-09-15 MED ORDER — SODIUM CHLORIDE 0.9% FLUSH
10.0000 mL | INTRAVENOUS | Status: DC | PRN
  Administered 2021-09-15: 10 mL

## 2021-09-15 MED ORDER — PALONOSETRON HCL INJECTION 0.25 MG/5ML
0.2500 mg | Freq: Once | INTRAVENOUS | Status: AC
  Administered 2021-09-15: 0.25 mg via INTRAVENOUS
  Filled 2021-09-15: qty 5

## 2021-09-15 MED ORDER — HEPARIN SOD (PORK) LOCK FLUSH 100 UNIT/ML IV SOLN
500.0000 [IU] | Freq: Once | INTRAVENOUS | Status: AC | PRN
  Administered 2021-09-15: 500 [IU]

## 2021-09-15 NOTE — Patient Instructions (Signed)
[facility]  Discharge Instructions: Thank you for choosing [facility] to provide your oncology and hematology care.  If you have a lab appointment with the [facility], please come in thru the Main Entrance and check in at the main information desk.  Wear comfortable clothing and clothing appropriate for easy access to any Portacath or PICC line.   We strive to give you quality time with your provider. You may need to reschedule your appointment if you arrive late (15 or more minutes).  Arriving late affects you and other patients whose appointments are after yours.  Also, if you miss three or more appointments without notifying the office, you may be dismissed from the clinic at the provider's discretion.      For prescription refill requests, have your pharmacy contact our office and allow 72 hours for refills to be completed.    Today you received the following chemotherapy and/or immunotherapy agents Gemzar.  Gemcitabine injection What is this medication? GEMCITABINE (jem SYE ta been) is a chemotherapy drug. This medicine is used to treat many types of cancer like breast cancer, lung cancer, pancreatic cancer, and ovarian cancer. This medicine may be used for other purposes; ask your health care provider or pharmacist if you have questions. COMMON BRAND NAME(S): Gemzar, Infugem What should I tell my care team before I take this medication? They need to know if you have any of these conditions: blood disorders infection kidney disease liver disease lung or breathing disease, like asthma recent or ongoing radiation therapy an unusual or allergic reaction to gemcitabine, other chemotherapy, other medicines, foods, dyes, or preservatives pregnant or trying to get pregnant breast-feeding How should I use this medication? This drug is given as an infusion into a vein. It is administered in a hospital or clinic by a specially trained health care  professional. Talk to your pediatrician regarding the use of this medicine in children. Special care may be needed. Overdosage: If you think you have taken too much of this medicine contact a poison control center or emergency room at once. NOTE: This medicine is only for you. Do not share this medicine with others. What if I miss a dose? It is important not to miss your dose. Call your doctor or health care professional if you are unable to keep an appointment. What may interact with this medication? medicines to increase blood counts like filgrastim, pegfilgrastim, sargramostim some other chemotherapy drugs like cisplatin vaccines Talk to your doctor or health care professional before taking any of these medicines: acetaminophen aspirin ibuprofen ketoprofen naproxen This list may not describe all possible interactions. Give your health care provider a list of all the medicines, herbs, non-prescription drugs, or dietary supplements you use. Also tell them if you smoke, drink alcohol, or use illegal drugs. Some items may interact with your medicine. What should I watch for while using this medication? Visit your doctor for checks on your progress. This drug may make you feel generally unwell. This is not uncommon, as chemotherapy can affect healthy cells as well as cancer cells. Report any side effects. Continue your course of treatment even though you feel ill unless your doctor tells you to stop. In some cases, you may be given additional medicines to help with side effects. Follow all directions for their use. Call your doctor or health care professional for advice if you get a fever, chills or sore throat, or other symptoms of a cold or flu. Do not treat yourself.  This drug decreases your body's ability to fight infections. Try to avoid being around people who are sick. This medicine may increase your risk to bruise or bleed. Call your doctor or health care professional if you notice any  unusual bleeding. Be careful brushing and flossing your teeth or using a toothpick because you may get an infection or bleed more easily. If you have any dental work done, tell your dentist you are receiving this medicine. Avoid taking products that contain aspirin, acetaminophen, ibuprofen, naproxen, or ketoprofen unless instructed by your doctor. These medicines may hide a fever. Do not become pregnant while taking this medicine or for 6 months after stopping it. Women should inform their doctor if they wish to become pregnant or think they might be pregnant. Men should not father a child while taking this medicine and for 3 months after stopping it. There is a potential for serious side effects to an unborn child. Talk to your health care professional or pharmacist for more information. Do not breast-feed an infant while taking this medicine or for at least 1 week after stopping it. Men should inform their doctors if they wish to father a child. This medicine may lower sperm counts. Talk with your doctor or health care professional if you are concerned about your fertility. What side effects may I notice from receiving this medication? Side effects that you should report to your doctor or health care professional as soon as possible: allergic reactions like skin rash, itching or hives, swelling of the face, lips, or tongue breathing problems pain, redness, or irritation at site where injected signs and symptoms of a dangerous change in heartbeat or heart rhythm like chest pain; dizziness; fast or irregular heartbeat; palpitations; feeling faint or lightheaded, falls; breathing problems signs of decreased platelets or bleeding - bruising, pinpoint red spots on the skin, black, tarry stools, blood in the urine signs of decreased red blood cells - unusually weak or tired, feeling faint or lightheaded, falls signs of infection - fever or chills, cough, sore throat, pain or difficulty passing urine signs  and symptoms of kidney injury like trouble passing urine or change in the amount of urine signs and symptoms of liver injury like dark yellow or Alexander [name] urine; general ill feeling or flu-like symptoms; light-colored stools; loss of appetite; nausea; right upper belly pain; unusually weak or tired; yellowing of the eyes or skin swelling of ankles, feet, hands Side effects that usually do not require medical attention (report to your doctor or health care professional if they continue or are bothersome): constipation diarrhea hair loss loss of appetite nausea rash vomiting This list may not describe all possible side effects. Call your doctor for medical advice about side effects. You may report side effects to FDA at 1-800-FDA-1088. Where should I keep my medication? This drug is given in a hospital or clinic and will not be stored at home. NOTE: This sheet is a summary. It may not cover all possible information. If you have questions about this medicine, talk to your doctor, pharmacist, or health care provider.  2023 Elsevier/Gold Standard (2017-06-06 00:00:00)        To help prevent nausea and vomiting after your treatment, we encourage you to take your nausea medication as directed.  BELOW ARE SYMPTOMS THAT SHOULD BE REPORTED IMMEDIATELY: *FEVER GREATER THAN 100.4 F (38 C) OR HIGHER *CHILLS OR SWEATING *NAUSEA AND VOMITING THAT IS NOT CONTROLLED WITH YOUR NAUSEA MEDICATION *UNUSUAL SHORTNESS OF BREATH *UNUSUAL BRUISING OR BLEEDING *URINARY  PROBLEMS (pain or burning when urinating, or frequent urination) *BOWEL PROBLEMS (unusual diarrhea, constipation, pain near the anus) TENDERNESS IN MOUTH AND THROAT WITH OR WITHOUT PRESENCE OF ULCERS (sore throat, sores in mouth, or a toothache) UNUSUAL RASH, SWELLING OR PAIN  UNUSUAL VAGINAL DISCHARGE OR ITCHING   Items with * indicate a potential emergency and should be followed up as soon as possible or go to the Emergency Department if any  problems should occur.  Please show the CHEMOTHERAPY ALERT CARD or IMMUNOTHERAPY ALERT CARD at check-in to the Emergency Department and triage nurse.  Should you have questions after your visit or need to cancel or reschedule your appointment, please contact [facility] [phone]  and follow the prompts.  Office hours are 8:00 a.m. to 4:30 p.m. Monday - Friday. Please note that voicemails left after 4:00 p.m. may not be returned until the following business day.  We are closed weekends and major holidays. You have access to a nurse at all times for urgent questions. Please call the main number to the clinic [phone] and follow the prompts.  For any non-urgent questions, you may also contact your provider using MyChart. We now offer e-Visits for anyone 78 and older to request care online for non-urgent symptoms. For details visit [email or web address].   Also download the MyChart app! Go to the app store, search "MyChart", open the app, select [facility], and log in with your MyChart username and password.  Masks are optional in the cancer centers. If you would like for your care team to wear a mask while they are taking care of you, please let them know. For doctor visits, patients may have with them one support person who is at least 72 years old. At this time, visitors are not allowed in the infusion area.

## 2021-09-15 NOTE — Progress Notes (Signed)
Patients port flushed without difficulty.  Good blood return noted with no bruising or swelling noted at site.  Patient remains accessed for chemotherapy treatment.  

## 2021-09-15 NOTE — Progress Notes (Signed)
Patient presents today for Gemzar infusion.  Patient is in satisfactory condition with no complaints voiced.  Vital signs are stable.  Labs reviewed.  Creatinine today is 2.42.  MD made aware.  All other labs are within treatment parameters.  We will proceed with treatment per Dr. Delton Coombes.  Patient tolerated treatment well with no complaints voiced.  Patient left ambulatory in stable condition.  Vital signs stable at discharge.  Follow up as scheduled.

## 2021-09-21 ENCOUNTER — Telehealth: Payer: Self-pay

## 2021-09-21 ENCOUNTER — Ambulatory Visit (INDEPENDENT_AMBULATORY_CARE_PROVIDER_SITE_OTHER): Payer: Medicare Other | Admitting: Physician Assistant

## 2021-09-21 VITALS — BP 95/46 | HR 74 | Ht 73.0 in | Wt 135.0 lb

## 2021-09-21 DIAGNOSIS — N138 Other obstructive and reflux uropathy: Secondary | ICD-10-CM | POA: Diagnosis not present

## 2021-09-21 DIAGNOSIS — N401 Enlarged prostate with lower urinary tract symptoms: Secondary | ICD-10-CM

## 2021-09-21 NOTE — Progress Notes (Signed)
Fill and Pull Catheter Removal  Patient is present today for a catheter removal.  Patient was cleaned and prepped in a sterile fashion 14ml of sterile water/ saline was instilled into the bladder when the patient felt the urge to urinate. 62ml of water was then drained from the balloon.  A 22FR foley cath was removed from the bladder complications were noted as: bladder spasms during the procedure  .Patient as then given some time to void on their own.  Patient cannot void their own after some time.  Patient tolerated well.  Performed by: Levi Aland, CMA  Follow up/ Additional notes: Patient unable to void after procedure due to bladder spasms.  PVR post procedure 172mL.

## 2021-09-21 NOTE — Telephone Encounter (Signed)
Patient called to inform us he is able to void on his own with no issues and a strong stream.  Informed him to reach out if he has any issues before his next apt with MD on 10/21/21.

## 2021-09-22 ENCOUNTER — Inpatient Hospital Stay (HOSPITAL_COMMUNITY): Payer: Medicare Other | Admitting: Hematology

## 2021-09-22 ENCOUNTER — Inpatient Hospital Stay (HOSPITAL_COMMUNITY): Payer: Medicare Other

## 2021-09-22 ENCOUNTER — Inpatient Hospital Stay (HOSPITAL_COMMUNITY): Payer: Medicare Other | Admitting: Dietician

## 2021-09-22 VITALS — BP 114/70 | HR 75 | Temp 98.2°F | Resp 18

## 2021-09-22 DIAGNOSIS — C109 Malignant neoplasm of oropharynx, unspecified: Secondary | ICD-10-CM

## 2021-09-22 DIAGNOSIS — C3492 Malignant neoplasm of unspecified part of left bronchus or lung: Secondary | ICD-10-CM

## 2021-09-22 DIAGNOSIS — Z5111 Encounter for antineoplastic chemotherapy: Secondary | ICD-10-CM | POA: Diagnosis not present

## 2021-09-22 LAB — CBC WITH DIFFERENTIAL/PLATELET
Abs Immature Granulocytes: 0 10*3/uL (ref 0.00–0.07)
Basophils Absolute: 0 10*3/uL (ref 0.0–0.1)
Basophils Relative: 0 %
Eosinophils Absolute: 0 10*3/uL (ref 0.0–0.5)
Eosinophils Relative: 2 %
HCT: 27.3 % — ABNORMAL LOW (ref 39.0–52.0)
Hemoglobin: 8.6 g/dL — ABNORMAL LOW (ref 13.0–17.0)
Lymphocytes Relative: 50 %
Lymphs Abs: 0.9 10*3/uL (ref 0.7–4.0)
MCH: 30.8 pg (ref 26.0–34.0)
MCHC: 31.5 g/dL (ref 30.0–36.0)
MCV: 97.8 fL (ref 80.0–100.0)
Monocytes Absolute: 0.1 10*3/uL (ref 0.1–1.0)
Monocytes Relative: 3 %
Neutro Abs: 0.8 10*3/uL — ABNORMAL LOW (ref 1.7–7.7)
Neutrophils Relative %: 45 %
Platelets: 396 10*3/uL (ref 150–400)
RBC: 2.79 MIL/uL — ABNORMAL LOW (ref 4.22–5.81)
RDW: 20.4 % — ABNORMAL HIGH (ref 11.5–15.5)
WBC: 1.8 10*3/uL — ABNORMAL LOW (ref 4.0–10.5)
nRBC: 0 % (ref 0.0–0.2)

## 2021-09-22 LAB — COMPREHENSIVE METABOLIC PANEL
ALT: 17 U/L (ref 0–44)
AST: 17 U/L (ref 15–41)
Albumin: 3.7 g/dL (ref 3.5–5.0)
Alkaline Phosphatase: 53 U/L (ref 38–126)
Anion gap: 8 (ref 5–15)
BUN: 31 mg/dL — ABNORMAL HIGH (ref 8–23)
CO2: 24 mmol/L (ref 22–32)
Calcium: 8.9 mg/dL (ref 8.9–10.3)
Chloride: 110 mmol/L (ref 98–111)
Creatinine, Ser: 2.29 mg/dL — ABNORMAL HIGH (ref 0.61–1.24)
GFR, Estimated: 30 mL/min — ABNORMAL LOW (ref >60)
Glucose, Bld: 94 mg/dL (ref 70–99)
Potassium: 4.3 mmol/L (ref 3.5–5.1)
Sodium: 142 mmol/L (ref 135–145)
Total Bilirubin: 0.6 mg/dL (ref 0.3–1.2)
Total Protein: 6.9 g/dL (ref 6.5–8.1)

## 2021-09-22 LAB — MAGNESIUM: Magnesium: 2 mg/dL (ref 1.7–2.4)

## 2021-09-22 MED ORDER — HEPARIN SOD (PORK) LOCK FLUSH 100 UNIT/ML IV SOLN
500.0000 [IU] | Freq: Once | INTRAVENOUS | Status: AC | PRN
  Administered 2021-09-22: 500 [IU]

## 2021-09-22 MED ORDER — SODIUM CHLORIDE 0.9 % IV SOLN
750.0000 mg/m2 | Freq: Once | INTRAVENOUS | Status: AC
  Administered 2021-09-22: 1292 mg via INTRAVENOUS
  Filled 2021-09-22: qty 33.98

## 2021-09-22 MED ORDER — SODIUM CHLORIDE 0.9 % IV SOLN: Freq: Once | INTRAVENOUS | Status: AC

## 2021-09-22 MED ORDER — PALONOSETRON HCL INJECTION 0.25 MG/5ML
0.2500 mg | Freq: Once | INTRAVENOUS | Status: AC
  Administered 2021-09-22: 0.25 mg via INTRAVENOUS
  Filled 2021-09-22: qty 5

## 2021-09-22 MED ORDER — SODIUM CHLORIDE 0.9% FLUSH
10.0000 mL | INTRAVENOUS | Status: DC | PRN
  Administered 2021-09-22: 10 mL

## 2021-09-22 NOTE — Progress Notes (Signed)
Labs reviewed today with MD. ANC 0.8 noted and MD aware. Ok to proceed per MD.

## 2021-09-22 NOTE — Progress Notes (Signed)
Nutrition Follow-up:  Patient with SCC of left lung. He is currently receiving gemcitabine q21d.  - 6/5-6/6 admission for BPH with urinary obstruction s/p TURP  Met with patient in infusion. He reports procedure went well and is voiding without difficulty. Patient says his appetite is great, He is eating 3 good meals and drinking 2 Ensure Plus as well as lots of water. Yesterday patient had 2 eggs, sausage, couple of pancakes for breakfast, 2 hotdogs all the way with tots for lunch, big bowl of spaghetti and meatballs for dinner. Patient denies nutrition impact symptoms.   Medications: reviewed  Labs: reviewed  Anthropometrics: Last weight 135 lb on 6/28 stable  6/8 - 136 lb 3.2 oz 5/27 - 133 lb 3 oz 5/9 - 128 lb 9.6 oz   NUTRITION DIAGNOSIS: Severe malnutrition improving    INTERVENTION:  Continue eating high calorie high protein foods to promote weight gain Continue drinking 2 Ensure Plus/equivalent  One complimentary case Ensure Plus High Protein provided     MONITORING, EVALUATION, GOAL: weight trends, intake    NEXT VISIT: To be scheduled as needed

## 2021-09-22 NOTE — Progress Notes (Signed)
Patients port flushed without difficulty.  Good blood return noted with no bruising or swelling noted at site.  Patient remains accessed for chemotherapy treatment.  

## 2021-09-22 NOTE — Patient Instructions (Signed)
[facility]  Discharge Instructions: Thank you for choosing [facility] to provide your oncology and hematology care.  If you have a lab appointment with the [facility], please come in thru the Main Entrance and check in at the main information desk.  Wear comfortable clothing and clothing appropriate for easy access to any Portacath or PICC line.   We strive to give you quality time with your provider. You may need to reschedule your appointment if you arrive late (15 or more minutes).  Arriving late affects you and other patients whose appointments are after yours.  Also, if you miss three or more appointments without notifying the office, you may be dismissed from the clinic at the provider's discretion.      For prescription refill requests, have your pharmacy contact our office and allow 72 hours for refills to be completed.    Today you received the following chemotherapy and/or immunotherapy agents Gemzar      To help prevent nausea and vomiting after your treatment, we encourage you to take your nausea medication as directed.  BELOW ARE SYMPTOMS THAT SHOULD BE REPORTED IMMEDIATELY: *FEVER GREATER THAN 100.4 F (38 C) OR HIGHER *CHILLS OR SWEATING *NAUSEA AND VOMITING THAT IS NOT CONTROLLED WITH YOUR NAUSEA MEDICATION *UNUSUAL SHORTNESS OF BREATH *UNUSUAL BRUISING OR BLEEDING *URINARY PROBLEMS (pain or burning when urinating, or frequent urination) *BOWEL PROBLEMS (unusual diarrhea, constipation, pain near the anus) TENDERNESS IN MOUTH AND THROAT WITH OR WITHOUT PRESENCE OF ULCERS (sore throat, sores in mouth, or a toothache) UNUSUAL RASH, SWELLING OR PAIN  UNUSUAL VAGINAL DISCHARGE OR ITCHING   Items with * indicate a potential emergency and should be followed up as soon as possible or go to the Emergency Department if any problems should occur.  Please show the CHEMOTHERAPY ALERT CARD or IMMUNOTHERAPY ALERT CARD at check-in to the Emergency  Department and triage nurse.  Should you have questions after your visit or need to cancel or reschedule your appointment, please contact [facility] [phone]  and follow the prompts.  Office hours are 8:00 a.m. to 4:30 p.m. Monday - Friday. Please note that voicemails left after 4:00 p.m. may not be returned until the following business day.  We are closed weekends and major holidays. You have access to a nurse at all times for urgent questions. Please call the main number to the clinic [phone] and follow the prompts.  For any non-urgent questions, you may also contact your provider using MyChart. We now offer e-Visits for anyone 73 and older to request care online for non-urgent symptoms. For details visit [email or web address].   Also download the MyChart app! Go to the app store, search "MyChart", open the app, select [facility], and log in with your MyChart username and password.  Masks are optional in the cancer centers. If you would like for your care team to wear a mask while they are taking care of you, please let them know. For doctor visits, patients may have with them one support person who is at least 72 years old. At this time, visitors are not allowed in the infusion area.

## 2021-09-22 NOTE — Progress Notes (Signed)
Patient presents today for Gemzar infusion per providers order.  Vital signs within parameters for treatment.  Labs reviewed and ANC noted to be 0.8.  Message received from Dr. Delton Coombes okay to proceed with treatment.  Patient has no new complaints at this time.  Gemzar given today per MD orders.  Stable during infusion without adverse affects.  Vital signs stable.  No complaints at this time.  Discharge from clinic ambulatory in stable condition.  Alert and oriented X 3.  Follow up with [facility] as scheduled.

## 2021-09-28 NOTE — Progress Notes (Signed)
Assessment: 1. Benign prostatic hyperplasia with urinary obstruction    Plan: Patient was unable to void initially and went home to continue attempting to void on his own.  He is advised to continue Flomax until his postop evaluation with Dr. Felipa Eth.  If he is unable to void by mid afternoon, he will return to the office for PVR and reinsertion of the Foley.  Will prescribe Rapaflo if this happens.  Patient called the office after leaving earlier stating he is able to void without difficulty.  Keep postop follow-up with Dr. Felipa Eth as scheduled.  Chief Complaint: No chief complaint on file.   HPI: Alexander [name] is a 71 y.o. male who presents for continued evaluation of urinary retention following transurethral resection of the prostate performed on 08/29/2021.  He has tolerated the catheter well and has no complaints of gross hematuria since the first few days postop.  Voiding trial is planned for today.   Portions of the above documentation were copied from a prior visit for review purposes only.  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 3 weeks ago.- Dr. Oneal [facility] [place], [place].   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    PSH: Past Surgical History:  Procedure Laterality Date   BIOPSY  01/15/2018   Procedure: BIOPSY;  Surgeon: Danie Binder, MD;  [facility];  Service: Endoscopy;;  gastric   COLONOSCOPY N/A 03/13/2016   Procedure: COLONOSCOPY;  Surgeon: Danie Binder, MD;  [facility];  Service: Endoscopy;  Laterality: N/A;  2:15 PM   CYSTOSCOPY N/A 08/29/2021   Procedure: CYSTOSCOPY;  Surgeon: Cleon Gustin, MD;  Location: [facility];  Service: Urology;  Laterality: N/A;   CYSTOSCOPY  WITH INSERTION OF UROLIFT N/A 06/27/2021   Procedure: CYSTOSCOPY WITH INSERTION OF UROLIFT;  Surgeon: Cleon Gustin, MD;  Location: [facility];  Service: Urology;  Laterality: N/A;   ESOPHAGOGASTRODUODENOSCOPY (EGD) WITH PROPOFOL N/A 08/17/2014   Procedure: ESOPHAGOGASTRODUODENOSCOPY (EGD) WITH PROPOFOL (procedure #1);  Surgeon: Aviva Signs Md, MD;  Location: [facility];  Service: General;  Laterality: N/A;   ESOPHAGOGASTRODUODENOSCOPY (EGD) WITH PROPOFOL N/A 01/15/2018   Procedure: ESOPHAGOGASTRODUODENOSCOPY (EGD) WITH PROPOFOL;  Surgeon: Danie Binder, MD;  [facility];  Service: Endoscopy;  Laterality: N/A;  9:30am   MULTIPLE EXTRACTIONS WITH ALVEOLOPLASTY N/A 08/12/2014   Procedure: Extraction of tooth #'s 6,17,22,23,24,25,26,27 with alveoloplasty;  Surgeon: Lenn Cal, DDS;  Location: [facility];  Service: Oral Surgery;  Laterality: N/A;   PANENDOSCOPY N/A 08/06/2014   Procedure: PANENDOSCOPY WITH BIOPSY;  Surgeon: Leta Baptist, MD;  Location: [facility];  Service: ENT;  Laterality: N/A;   PEG PLACEMENT Left 08/17/14   PEG PLACEMENT N/A 08/17/2014   Procedure: PERCUTANEOUS ENDOSCOPIC GASTROSTOMY (PEG) PLACEMENT (procedure #1);  Surgeon: Aviva Signs Md, MD;  Location[facility];  Service: General;  Laterality: N/A;   PORT-A-CATH REMOVAL Right 07/17/2016   Procedure: MINOR REMOVAL PORT-A-CATH;  Surgeon: Aviva Signs, MD;  Location[facility];  Service: General;  Laterality: Right;   PORTACATH PLACEMENT Right 08/17/14   PORTACATH PLACEMENT Right 08/17/2014   Procedure: INSERTION PORT-A-CATH (procedure #2);  Surgeon: Aviva Signs Md, MD;  [facility];  Service: General;  Laterality: Right;   PORTACATH PLACEMENT Left 04/26/2018   Procedure: INSERTION PORT-A-CATH (attached catheter in left subclavian);  Surgeon: Aviva Signs, MD;  Location[facility];  Service: General;  Laterality: Left;   SAVORY DILATION N/A 01/15/2018   Procedure: SAVORY DILATION;  Surgeon: Danie Binder, MD;   [facility];  Service: Endoscopy;  Laterality: N/A;   TRANSURETHRAL RESECTION OF PROSTATE N/A 08/29/2021   Procedure: TRANSURETHRAL RESECTION OF THE PROSTATE (TURP);  Surgeon: Cleon Gustin, MD;  Location[facility];  Service: Urology;  Laterality: N/A;   VIDEO BRONCHOSCOPY WITH ENDOBRONCHIAL ULTRASOUND N/A 04/15/2018   Procedure: VIDEO BRONCHOSCOPY WITH ENDOBRONCHIAL ULTRASOUND;  Surgeon: Melrose Nakayama, MD;  Location: [facility];  Service: Thoracic;  Laterality: N/A;    SH: Social History   Tobacco Use   Smoking status: Former    Packs/day: 0.50    Years: 30.00    Total pack years: 15.00    Types: Cigarettes    Quit date: 07/22/2014    Years since quitting: 7.1   Smokeless tobacco: Never  Vaping Use   Vaping Use: Never used  Substance Use Topics   Alcohol use: Not Currently    Alcohol/week: 0.0 standard drinks of alcohol    Comment: None currently (11/09/17); previously 1-2 beers on the weekend   Drug use: No    ROS: All other review of systems were reviewed and are negative except what is noted above in HPI  PE: BP (!) 95/46   Pulse 74   Ht 6\' 1"  (1.854 m)   Wt 135 lb (61.2 kg)   BMI 17.81 kg/m  GENERAL APPEARANCE:  Well appearing, well developed, well nourished, NAD HEENT:  Atraumatic, normocephalic NECK:  Supple. Trachea midline ABDOMEN:  Soft, non-tender, no masses EXTREMITIES:  Moves all extremities well, without clubbing, cyanosis, or edema NEUROLOGIC:  Alert and oriented x 3, normal gait, CN II-XII grossly intact MENTAL STATUS:  appropriate BACK:  Non-tender to palpation, No CVAT SKIN:  Warm, dry, and intact   Results: Laboratory Data: Lab Results  Component Value Date   WBC 1.8 (L) 09/22/2021   HGB 8.6 (L) 09/22/2021   HCT 27.3 (L) 09/22/2021   MCV 97.8 09/22/2021   PLT 396 09/22/2021    Lab Results  Component Value Date   CREATININE 2.29 (H) 09/22/2021    No results found for: "PSA"  No results found for: "TESTOSTERONE"  No  results found for: "HGBA1C"  Urinalysis    Component Value Date/Time   COLORURINE BROWN (A) 08/20/2021 1258   APPEARANCEUR CLOUDY (A) 08/20/2021 1258   APPEARANCEUR Clear 12/15/2020 1008   LABSPEC 1.006 08/20/2021 1258   PHURINE 6.0 08/20/2021 1258   GLUCOSEU NEGATIVE 08/20/2021 1258   HGBUR LARGE (A) 08/20/2021 1258   BILIRUBINUR NEGATIVE 08/20/2021 1258   BILIRUBINUR Negative 12/15/2020 1008   KETONESUR NEGATIVE 08/20/2021 1258   PROTEINUR 100 (A) 08/20/2021 1258   NITRITE POSITIVE (A) 08/20/2021 1258   LEUKOCYTESUR MODERATE (A) 08/20/2021 1258    Lab Results  Component Value Date   LABMICR See below: 12/15/2020   WBCUA 0-5 12/15/2020   LABEPIT 0-10 12/15/2020   BACTERIA NONE SEEN 08/20/2021    Pertinent Imaging: No results found for this or any previous visit.  No results found for this or any previous visit.  No results found for this or any previous visit.  No results found for this or any previous visit.  No results found for this or any previous visit.  No results  found for this or any previous visit.  No results found for this or any previous visit.  No results found for this or any previous visit.  No results found for this or any previous visit (from the past 24 hour(s)).

## 2021-10-03 ENCOUNTER — Other Ambulatory Visit (HOSPITAL_COMMUNITY): Payer: Self-pay | Admitting: *Deleted

## 2021-10-03 ENCOUNTER — Other Ambulatory Visit (HOSPITAL_COMMUNITY): Payer: Self-pay | Admitting: Hematology

## 2021-10-03 ENCOUNTER — Inpatient Hospital Stay (HOSPITAL_COMMUNITY): Payer: Medicare Other | Attending: Hematology

## 2021-10-03 ENCOUNTER — Ambulatory Visit (HOSPITAL_COMMUNITY)
Admission: RE | Admit: 2021-10-03 | Discharge: 2021-10-03 | Disposition: A | Discharge status: Home / Self Care | Payer: Medicare Other | Source: Ambulatory Visit | Attending: Hematology | Admitting: Hematology

## 2021-10-03 DIAGNOSIS — C3492 Malignant neoplasm of unspecified part of left bronchus or lung: Secondary | ICD-10-CM | POA: Diagnosis present

## 2021-10-03 DIAGNOSIS — Z5111 Encounter for antineoplastic chemotherapy: Secondary | ICD-10-CM | POA: Insufficient documentation

## 2021-10-03 DIAGNOSIS — M25561 Pain in right knee: Secondary | ICD-10-CM | POA: Insufficient documentation

## 2021-10-03 DIAGNOSIS — C61 Malignant neoplasm of prostate: Secondary | ICD-10-CM | POA: Insufficient documentation

## 2021-10-03 DIAGNOSIS — I2510 Atherosclerotic heart disease of native coronary artery without angina pectoris: Secondary | ICD-10-CM | POA: Insufficient documentation

## 2021-10-03 DIAGNOSIS — E039 Hypothyroidism, unspecified: Secondary | ICD-10-CM | POA: Insufficient documentation

## 2021-10-03 DIAGNOSIS — C7951 Secondary malignant neoplasm of bone: Secondary | ICD-10-CM | POA: Insufficient documentation

## 2021-10-03 DIAGNOSIS — Z85118 Personal history of other malignant neoplasm of bronchus and lung: Secondary | ICD-10-CM | POA: Diagnosis not present

## 2021-10-03 DIAGNOSIS — I7090 Unspecified atherosclerosis: Secondary | ICD-10-CM | POA: Insufficient documentation

## 2021-10-03 DIAGNOSIS — Z9221 Personal history of antineoplastic chemotherapy: Secondary | ICD-10-CM | POA: Diagnosis not present

## 2021-10-03 DIAGNOSIS — Z923 Personal history of irradiation: Secondary | ICD-10-CM | POA: Insufficient documentation

## 2021-10-03 DIAGNOSIS — M25562 Pain in left knee: Secondary | ICD-10-CM | POA: Insufficient documentation

## 2021-10-03 DIAGNOSIS — D631 Anemia in chronic kidney disease: Secondary | ICD-10-CM | POA: Insufficient documentation

## 2021-10-03 DIAGNOSIS — I700 Atherosclerosis of aorta: Secondary | ICD-10-CM | POA: Insufficient documentation

## 2021-10-03 DIAGNOSIS — J439 Emphysema, unspecified: Secondary | ICD-10-CM | POA: Diagnosis not present

## 2021-10-03 DIAGNOSIS — C7902 Secondary malignant neoplasm of left kidney and renal pelvis: Secondary | ICD-10-CM | POA: Insufficient documentation

## 2021-10-03 DIAGNOSIS — N134 Hydroureter: Secondary | ICD-10-CM | POA: Diagnosis not present

## 2021-10-03 DIAGNOSIS — N1330 Unspecified hydronephrosis: Secondary | ICD-10-CM | POA: Diagnosis not present

## 2021-10-03 DIAGNOSIS — R64 Cachexia: Secondary | ICD-10-CM | POA: Diagnosis not present

## 2021-10-03 DIAGNOSIS — Z85810 Personal history of malignant neoplasm of tongue: Secondary | ICD-10-CM | POA: Insufficient documentation

## 2021-10-03 DIAGNOSIS — N189 Chronic kidney disease, unspecified: Secondary | ICD-10-CM | POA: Insufficient documentation

## 2021-10-03 DIAGNOSIS — Z87891 Personal history of nicotine dependence: Secondary | ICD-10-CM | POA: Insufficient documentation

## 2021-10-03 DIAGNOSIS — R748 Abnormal levels of other serum enzymes: Secondary | ICD-10-CM | POA: Diagnosis not present

## 2021-10-03 DIAGNOSIS — Z79890 Hormone replacement therapy (postmenopausal): Secondary | ICD-10-CM | POA: Insufficient documentation

## 2021-10-03 DIAGNOSIS — R911 Solitary pulmonary nodule: Secondary | ICD-10-CM | POA: Insufficient documentation

## 2021-10-03 DIAGNOSIS — Z08 Encounter for follow-up examination after completed treatment for malignant neoplasm: Secondary | ICD-10-CM | POA: Diagnosis not present

## 2021-10-03 DIAGNOSIS — N289 Disorder of kidney and ureter, unspecified: Secondary | ICD-10-CM | POA: Insufficient documentation

## 2021-10-03 LAB — CBC WITH DIFFERENTIAL/PLATELET
Abs Immature Granulocytes: 0.01 10*3/uL (ref 0.00–0.07)
Basophils Absolute: 0 10*3/uL (ref 0.0–0.1)
Basophils Relative: 0 %
Eosinophils Absolute: 0 10*3/uL (ref 0.0–0.5)
Eosinophils Relative: 1 %
HCT: 24.6 % — ABNORMAL LOW (ref 39.0–52.0)
Hemoglobin: 8.1 g/dL — ABNORMAL LOW (ref 13.0–17.0)
Immature Granulocytes: 0 %
Lymphocytes Relative: 20 %
Lymphs Abs: 0.9 10*3/uL (ref 0.7–4.0)
MCH: 32.5 pg (ref 26.0–34.0)
MCHC: 32.9 g/dL (ref 30.0–36.0)
MCV: 98.8 fL (ref 80.0–100.0)
Monocytes Absolute: 0.6 10*3/uL (ref 0.1–1.0)
Monocytes Relative: 13 %
Neutro Abs: 3 10*3/uL (ref 1.7–7.7)
Neutrophils Relative %: 66 %
Platelets: 120 10*3/uL — ABNORMAL LOW (ref 150–400)
RBC: 2.49 MIL/uL — ABNORMAL LOW (ref 4.22–5.81)
RDW: 22.2 % — ABNORMAL HIGH (ref 11.5–15.5)
WBC: 4.5 10*3/uL (ref 4.0–10.5)
nRBC: 0 % (ref 0.0–0.2)

## 2021-10-03 LAB — COMPREHENSIVE METABOLIC PANEL
ALT: 20 U/L (ref 0–44)
AST: 18 U/L (ref 15–41)
Albumin: 3.6 g/dL (ref 3.5–5.0)
Alkaline Phosphatase: 57 U/L (ref 38–126)
Anion gap: 7 (ref 5–15)
BUN: 46 mg/dL — ABNORMAL HIGH (ref 8–23)
CO2: 23 mmol/L (ref 22–32)
Calcium: 8.8 mg/dL — ABNORMAL LOW (ref 8.9–10.3)
Chloride: 108 mmol/L (ref 98–111)
Creatinine, Ser: 2.47 mg/dL — ABNORMAL HIGH (ref 0.61–1.24)
GFR, Estimated: 27 mL/min — ABNORMAL LOW (ref >60)
Glucose, Bld: 98 mg/dL (ref 70–99)
Potassium: 4.6 mmol/L (ref 3.5–5.1)
Sodium: 138 mmol/L (ref 135–145)
Total Bilirubin: 0.4 mg/dL (ref 0.3–1.2)
Total Protein: 7.1 g/dL (ref 6.5–8.1)

## 2021-10-03 LAB — IRON AND TIBC
Iron: 38 ug/dL — ABNORMAL LOW (ref 45–182)
Saturation Ratios: 13 % — ABNORMAL LOW (ref 17.9–39.5)
TIBC: 292 ug/dL (ref 250–450)
UIBC: 254 ug/dL

## 2021-10-03 LAB — FERRITIN: Ferritin: 313 ng/mL (ref 24–336)

## 2021-10-03 LAB — MAGNESIUM: Magnesium: 2.1 mg/dL (ref 1.7–2.4)

## 2021-10-03 LAB — TSH: TSH: 2.91 u[IU]/mL (ref 0.350–4.500)

## 2021-10-03 LAB — PSA: Prostatic Specific Antigen: 99.78 ng/mL — ABNORMAL HIGH (ref 0.00–4.00)

## 2021-10-03 MED ORDER — LEVOTHYROXINE SODIUM 88 MCG PO TABS: ORAL_TABLET | ORAL | 2 refills | Status: DC

## 2021-10-03 MED ORDER — OMEPRAZOLE 20 MG PO CPDR: 20.0000 mg | DELAYED_RELEASE_CAPSULE | Freq: Every day | ORAL | 3 refills | Status: DC

## 2021-10-06 ENCOUNTER — Inpatient Hospital Stay (HOSPITAL_COMMUNITY): Payer: Medicare Other

## 2021-10-06 ENCOUNTER — Inpatient Hospital Stay (HOSPITAL_BASED_OUTPATIENT_CLINIC_OR_DEPARTMENT_OTHER): Payer: Medicare Other | Admitting: Hematology

## 2021-10-06 VITALS — BP 113/72 | HR 70 | Temp 97.2°F | Resp 18

## 2021-10-06 VITALS — BP 99/61 | HR 86 | Temp 98.2°F | Resp 18 | Ht 73.0 in | Wt 129.4 lb

## 2021-10-06 DIAGNOSIS — C61 Malignant neoplasm of prostate: Secondary | ICD-10-CM | POA: Diagnosis not present

## 2021-10-06 DIAGNOSIS — Z85810 Personal history of malignant neoplasm of tongue: Secondary | ICD-10-CM | POA: Diagnosis not present

## 2021-10-06 DIAGNOSIS — C3492 Malignant neoplasm of unspecified part of left bronchus or lung: Secondary | ICD-10-CM

## 2021-10-06 DIAGNOSIS — D509 Iron deficiency anemia, unspecified: Secondary | ICD-10-CM | POA: Insufficient documentation

## 2021-10-06 DIAGNOSIS — M25561 Pain in right knee: Secondary | ICD-10-CM | POA: Diagnosis not present

## 2021-10-06 DIAGNOSIS — C109 Malignant neoplasm of oropharynx, unspecified: Secondary | ICD-10-CM | POA: Diagnosis not present

## 2021-10-06 DIAGNOSIS — Z87891 Personal history of nicotine dependence: Secondary | ICD-10-CM | POA: Diagnosis not present

## 2021-10-06 DIAGNOSIS — C7902 Secondary malignant neoplasm of left kidney and renal pelvis: Secondary | ICD-10-CM | POA: Diagnosis not present

## 2021-10-06 DIAGNOSIS — Z9221 Personal history of antineoplastic chemotherapy: Secondary | ICD-10-CM | POA: Diagnosis not present

## 2021-10-06 DIAGNOSIS — M25562 Pain in left knee: Secondary | ICD-10-CM | POA: Diagnosis not present

## 2021-10-06 DIAGNOSIS — N189 Chronic kidney disease, unspecified: Secondary | ICD-10-CM | POA: Diagnosis not present

## 2021-10-06 DIAGNOSIS — Z5111 Encounter for antineoplastic chemotherapy: Secondary | ICD-10-CM | POA: Diagnosis present

## 2021-10-06 DIAGNOSIS — Z923 Personal history of irradiation: Secondary | ICD-10-CM | POA: Diagnosis not present

## 2021-10-06 DIAGNOSIS — E039 Hypothyroidism, unspecified: Secondary | ICD-10-CM | POA: Diagnosis not present

## 2021-10-06 DIAGNOSIS — Z79890 Hormone replacement therapy (postmenopausal): Secondary | ICD-10-CM | POA: Diagnosis not present

## 2021-10-06 DIAGNOSIS — D631 Anemia in chronic kidney disease: Secondary | ICD-10-CM | POA: Diagnosis not present

## 2021-10-06 MED ORDER — SODIUM CHLORIDE 0.9% FLUSH
10.0000 mL | INTRAVENOUS | Status: DC | PRN
  Administered 2021-10-06: 10 mL

## 2021-10-06 MED ORDER — LORATADINE 10 MG PO TABS
10.0000 mg | ORAL_TABLET | Freq: Once | ORAL | Status: AC
  Administered 2021-10-06: 10 mg via ORAL
  Filled 2021-10-06: qty 1

## 2021-10-06 MED ORDER — SODIUM CHLORIDE 0.9 % IV SOLN: Freq: Once | INTRAVENOUS | Status: AC

## 2021-10-06 MED ORDER — PALONOSETRON HCL INJECTION 0.25 MG/5ML
0.2500 mg | Freq: Once | INTRAVENOUS | Status: AC
  Administered 2021-10-06: 0.25 mg via INTRAVENOUS
  Filled 2021-10-06: qty 5

## 2021-10-06 MED ORDER — ACETAMINOPHEN 325 MG PO TABS
650.0000 mg | ORAL_TABLET | Freq: Once | ORAL | Status: AC
  Administered 2021-10-06: 650 mg via ORAL
  Filled 2021-10-06: qty 2

## 2021-10-06 MED ORDER — HEPARIN SOD (PORK) LOCK FLUSH 100 UNIT/ML IV SOLN
500.0000 [IU] | Freq: Once | INTRAVENOUS | Status: AC | PRN
  Administered 2021-10-06: 500 [IU]

## 2021-10-06 MED ORDER — SODIUM CHLORIDE 0.9 % IV SOLN
750.0000 mg/m2 | Freq: Once | INTRAVENOUS | Status: AC
  Administered 2021-10-06: 1292 mg via INTRAVENOUS
  Filled 2021-10-06: qty 26.3

## 2021-10-06 MED ORDER — SODIUM CHLORIDE 0.9 % IV SOLN
400.0000 mg | Freq: Once | INTRAVENOUS | Status: AC
  Administered 2021-10-06: 400 mg via INTRAVENOUS
  Filled 2021-10-06: qty 20

## 2021-10-06 NOTE — Progress Notes (Signed)
[place], [place] [address]   CLINIC:  Medical Oncology/Hematology  PCP:  Lemmie Evens, MD [place]. 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   07/28/2014 Initial Diagnosis   Oropharyngeal cancer   08/03/2014 Imaging   CT CAP- L supraclavicular lymphadenopathy is not completely visualized. This is better seen on the previous neck CT from 07/27/2014. Otherwise, no evidence for metastatic disease in the chest, abdomen, or pelvis.   08/03/2014 Imaging   Bone scan- Uptake at adjacent anterior LEFT 6, 7, 8 ribs likely representing trauma/fractures. Questionable nonspecific increased tracer localization at the posterior RIGHT 8th and 9th ribs, the adjacent nature which raises a a question of trauma as well   08/06/2014 Pathology Results   Dr. Benjamine Mola- Oropharynx, biopsy, Left - INVASIVE SQUAMOUS CELL CARCINOMA.   08/12/2014 Procedure   Dr. Enrique Sack- 1. Multiple extraction of tooth numbers 6, 17, 22, 23, 24, 25, 26, and 27. 3 Quadrants of alveoloplasty   08/17/2014 Pathology Results   PORT and G-TUBE placed by Dr. Carlis Stable.   08/26/2014 PET scan   Large hypermetabolic mass in the left base of tongue. Activity extends across midline to the right base tongue. 2. Intensely hypermetabolic left cervical metastatic lymph nodes. Lymph nodes extend from the left level II position to the left supraclavi   09/01/2014 - 09/22/2014 Chemotherapy   Concurrent chemoradiation with Cisplatin 100 mg/m2 x 2 cycles with Neulasta  support. Held cycle #3 d/t renal toxicity.    09/03/2014 - 10/23/2014 Radiation Therapy   Treated in [place], IMRT Isidore Moos).  Base of tongue and bilat neck. Total dose: 70 Gy in 35 fractions. (of note, he did miss several treatments requiring BID dosing towards the end of treatment).    01/25/2015 PET scan   Near complete resolution of metabolic activity at the base of tongue. Minimal residual activity is likely post treatment effect. 2. Complete resolution of metabolic activity above LEFT cervical lymph nodes. No evidence of residual metabolically active    06/23/5186 Procedure   Port-a-cath removed [name])    05/03/2018 - 08/23/2018 Chemotherapy   The patient had dexamethasone (DECADRON) 4 MG tablet, 8 mg, Oral, Daily, 1 of 1 cycle, Start date: 05/01/2018, End date: 10/23/2018 palonosetron (ALOXI) injection 0.25 mg, 0.25 mg, Intravenous,  Once, 6 of 6 cycles Administration: 0.25 mg (05/03/2018), 0.25 mg (05/24/2018), 0.25 mg (06/14/2018), 0.25 mg (07/09/2018), 0.25 mg (07/30/2018), 0.25 mg (08/21/2018) pegfilgrastim-cbqv (UDENYCA) injection 6 mg, 6 mg, Subcutaneous, Once, 5 of 5 cycles Administration: 6 mg (05/27/2018), 6 mg (06/17/2018), 6 mg (07/11/2018), 6 mg (08/01/2018), 6 mg (08/23/2018) CARBOplatin (PARAPLATIN) 380 mg in sodium chloride 0.9 % 250 mL chemo infusion, 380 mg (100 % of original dose 381 mg), Intravenous,  Once, 6 of 6 cycles Dose modification:   (original dose 381 mg, Cycle 1),   (original dose 309.5 mg, Cycle 2), 307.5 mg (original dose 309.5 mg, Cycle 5) Administration: 380 mg (05/03/2018), 310 mg (05/24/2018), 310 mg (06/14/2018), 340 mg (07/09/2018), 310 mg (07/30/2018), 350 mg (08/21/2018) PACLitaxel (TAXOL) 330 mg in sodium chloride 0.9 %  500 mL chemo infusion (> 80m/m2), 175 mg/m2 = 330 mg (100 % of original dose 175 mg/m2), Intravenous,  Once, 6 of 6 cycles Dose modification: 175 mg/m2 (original dose 175 mg/m2, Cycle 1, Reason: Patient Age) Administration: 330 mg (05/03/2018), 330 mg (05/24/2018),  330 mg (06/14/2018), 330 mg (07/09/2018), 330 mg (07/30/2018), 330 mg (08/21/2018)  for chemotherapy treatment.    05/24/2018 - 05/06/2021 Chemotherapy   Patient is on Treatment Plan : HEAD/NECK Pembrolizumab/Taxol/Carboplatin Q21D     07/05/2021 -  Chemotherapy   Patient is on Treatment Plan : LUNG Gemcitabine D1,8 q21d     Squamous cell lung cancer, left ([facility]  06/14/2018 Initial Diagnosis   Squamous cell lung cancer, left ([facility])     CANCER STAGING:  Cancer Staging  Oropharyngeal carcinoma (HCC) Staging form: Pharynx - Oropharynx, AJCC 7th Edition - Clinical: Stage IVA (T4a, N2b, M0) - Unsigned   INTERVAL HISTORY:  Alexander [name] a 71y.o. male, returns for routine follow-up and consideration for next cycle of chemotherapy. Alexander [name] last seen on 09/01/2021.  Due for cycle #5 of Gemcitabine today.   Overall, he tells me he has been feeling pretty well. He denies n/v/d. He reports he is eating well and drinks 2 Boost daily. He has lost 7 lbs since his last visit.   Overall, he feels ready for next cycle of chemo today.    REVIEW OF SYSTEMS:  Review of Systems  Constitutional:  Positive for unexpected weight change (-7 lbs). Negative for appetite change and fatigue.  Gastrointestinal:  Negative for diarrhea, nausea and vomiting.  Musculoskeletal:  Positive for arthralgias (6/10 knee).  All other systems reviewed and are negative.   PAST MEDICAL/SURGICAL HISTORY:  Past Medical History:  Diagnosis Date   GERD (gastroesophageal reflux disease)    Mass of neck    dx. oropharyngeal squamous cell carcinoma- Chemo. radiation planned   [place] 07/28/2014   dx. 3 weeks ago.- [facility] [place]   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[facility] 08/06/2014   SCCa of Left BOT   Past Surgical History:  Procedure Laterality Date   BIOPSY  01/15/2018   Procedure: BIOPSY;  Surgeon: FDanie Binder MD;  [facility];  Service:  Endoscopy;;  gastric   COLONOSCOPY N/A 03/13/2016   Procedure: COLONOSCOPY;  Surgeon: SDanie Binder MD;  [facility];  Service: Endoscopy;  Laterality: N/A;  2:15 PM   CYSTOSCOPY N/A 08/29/2021   Procedure: CYSTOSCOPY;  Surgeon: MCleon Gustin MD;  Location: [facility];  Service: Urology;  Laterality: N/A;   CYSTOSCOPY WITH INSERTION OF UROLIFT N/A 06/27/2021   Procedure: CYSTOSCOPY WITH INSERTION OF UROLIFT;  Surgeon: MCleon Gustin MD;  Location: [facility];  Service: Urology;  Laterality: N/A;   ESOPHAGOGASTRODUODENOSCOPY (EGD) WITH PROPOFOL N/A 08/17/2014   Procedure: ESOPHAGOGASTRODUODENOSCOPY (EGD) WITH PROPOFOL (procedure #1);  Surgeon: MAviva SignsMd, MD;  Location: [facility];  Service: General;  Laterality: N/A;   ESOPHAGOGASTRODUODENOSCOPY (EGD) WITH PROPOFOL N/A 01/15/2018   Procedure: ESOPHAGOGASTRODUODENOSCOPY (EGD) WITH PROPOFOL;  Surgeon: FDanie Binder MD;  [facility];  Service: Endoscopy;  Laterality: N/A;  9:30am   MULTIPLE EXTRACTIONS WITH ALVEOLOPLASTY N/A 08/12/2014   Procedure: Extraction of tooth #'s 6,17,22,23,24,25,26,27 with alveoloplasty;  Surgeon: RLenn Cal DDS;  Location: [facility];  Service: Oral Surgery;  Laterality: N/A;   PANENDOSCOPY N/A 08/06/2014   Procedure: PANENDOSCOPY WITH BIOPSY;  Surgeon: SLeta Baptist MD;  [facility];  Service: ENT;  Laterality: N/A;   PEG PLACEMENT Left 08/17/14   PEG PLACEMENT N/A 08/17/2014   Procedure: PERCUTANEOUS ENDOSCOPIC GASTROSTOMY (PEG) PLACEMENT (procedure #1);  Surgeon: Aviva Signs Md, MD;  Location: [facility];  Service: General;  Laterality: N/A;   PORT-A-CATH REMOVAL Right 07/17/2016   Procedure: MINOR REMOVAL PORT-A-CATH;  Surgeon: Aviva Signs, MD;  Location: [facility];  Service: General;  Laterality: Right;   PORTACATH PLACEMENT Right 08/17/14   PORTACATH PLACEMENT Right 08/17/2014   Procedure: INSERTION PORT-A-CATH (procedure #2);  Surgeon: Aviva Signs Md, MD;  [facility]: [facility];  Service: General;  Laterality: Right;   PORTACATH PLACEMENT Left 04/26/2018   Procedure: INSERTION PORT-A-CATH (attached catheter in left subclavian);  Surgeon: Aviva Signs, MD;  [facility];  Service: General;  Laterality: Left;   SAVORY DILATION N/A 01/15/2018   Procedure: SAVORY DILATION;  Surgeon: Danie Binder, MD;  [facility];  Service: Endoscopy;  Laterality: N/A;   TRANSURETHRAL RESECTION OF PROSTATE N/A 08/29/2021   Procedure: TRANSURETHRAL RESECTION OF THE PROSTATE (TURP);  Surgeon: Cleon Gustin, MD;  Location[facility];  Service: Urology;  Laterality: N/A;   VIDEO BRONCHOSCOPY WITH ENDOBRONCHIAL ULTRASOUND N/A 04/15/2018   Procedure: VIDEO BRONCHOSCOPY WITH ENDOBRONCHIAL ULTRASOUND;  Surgeon: Melrose Nakayama, MD;  Location: [facility] OR;  Service: Thoracic;  Laterality: N/A;    SOCIAL HISTORY:  Social History   Socioeconomic History   Marital status: Legally Separated    Spouse name: Not on file   Number of children: 5   Years of education: Not on file   Highest education level: Not on file  Occupational History   Not on file  Tobacco Use   Smoking status: Former    Packs/day: 0.50    Years: 30.00    Total pack years: 15.00    Types: Cigarettes    Quit date: 07/22/2014    Years since quitting: 7.2   Smokeless tobacco: Never  Vaping Use   Vaping Use: Never used  Substance and Sexual Activity   Alcohol use: Not Currently    Alcohol/week: 0.0 standard drinks of alcohol    Comment: None currently (11/09/17); previously 1-2 beers on the weekend   Drug use: No   Sexual activity: Yes  Other Topics Concern   Not on file  Social History Narrative   Not on file   Social Determinants of Health   Financial Resource Strain: Low Risk  (02/12/2020)   Overall Financial Resource Strain (CARDIA)    Difficulty of Paying Living Expenses: Not hard at all  Food Insecurity: No Food Insecurity (02/12/2020)   Hunger Vital Sign    Worried About Running  Out of Food in the Last Year: Never true    Ran Out of Food in the Last Year: Never true  Transportation Needs: No Transportation Needs (02/12/2020)   PRAPARE - Hydrologist (Medical): No    Lack of Transportation (Non-Medical): No  Physical Activity: Inactive (02/12/2020)   Exercise Vital Sign    Days of Exercise per Week: 0 days    Minutes of Exercise per Session: 0 min  Stress: No Stress Concern Present (02/12/2020)   [place]    Feeling of Stress : Not at all  Social Connections: Moderately Isolated (02/12/2020)   Social Connection and Isolation Panel [NHANES]    Frequency of Communication with Friends and Family: More than three times a week  Frequency of Social Gatherings with Friends and Family: Twice a week    Attends Religious Services: Never    Marine scientist or Organizations: No    Attends Archivist Meetings: Never    Marital Status: Married  Human resources officer Violence: Not At Risk (02/12/2020)   Humiliation, Afraid, Rape, and Kick questionnaire    Fear of Current or Ex-Partner: No    Emotionally Abused: No    Physically Abused: No    Sexually Abused: No    FAMILY HISTORY:  Family History  Problem Relation Age of Onset   Colon cancer Neg Hx    Gastric cancer Neg Hx    Esophageal cancer Neg Hx     CURRENT MEDICATIONS:  Current Outpatient Medications  Medication Sig Dispense Refill   Gemcitabine HCl (GEMZAR IV) Inject into the vein once a week. Days 1 & 8 q 21 days     HYDROcodone-acetaminophen (NORCO) 10-325 MG tablet TAKE 1 TABLET EVERY 12 HOURS AS NEEDED. 60 tablet 0   levothyroxine (SYNTHROID) 88 MCG tablet TAKE ONE TABLET BY MOUTH DAILY BEFORE BREAKFAST 30 tablet 2   naproxen sodium (ALEVE) 220 MG tablet Take 220 mg by mouth daily as needed (pain.).     omeprazole (PRILOSEC) 20 MG capsule Take 1 capsule (20 mg total) by mouth daily. 30 capsule 3    tadalafil (CIALIS) 20 MG tablet Take 1 tablet (20 mg total) by mouth daily as needed. 10 tablet 5   tamsulosin (FLOMAX) 0.4 MG CAPS capsule Take 1 capsule (0.4 mg total) by mouth 2 (two) times daily. 60 capsule 11   No current facility-administered medications for this visit.    ALLERGIES:  No Known Allergies  PHYSICAL EXAM:  Performance status (ECOG): 1 - Symptomatic but completely ambulatory  There were no vitals filed for this visit. Wt Readings from Last 3 Encounters:  09/21/21 135 lb (61.2 kg)  09/15/21 132 lb 3.2 oz (60 kg)  09/01/21 136 lb 3.2 oz (61.8 kg)   Physical Exam Vitals reviewed.  Constitutional:      Appearance: Normal appearance.  Cardiovascular:     Rate and Rhythm: Normal rate and regular rhythm.     Pulses: Normal pulses.     Heart sounds: Normal heart sounds.  Pulmonary:     Effort: Pulmonary effort is normal.     Breath sounds: Normal breath sounds.  Neurological:     General: No focal deficit present.     Mental Status: He is alert and oriented to person, place, and time.  Psychiatric:        Mood and Affect: Mood normal.        Behavior: Behavior normal.     LABORATORY DATA:  I have reviewed the labs as listed.     Latest Ref Rng & Units 10/03/2021   10:54 AM 09/22/2021   12:15 PM 09/15/2021    1:29 PM  CBC  WBC 4.0 - 10.5 K/uL 4.5  1.8  4.7   Hemoglobin 13.0 - 17.0 g/dL 8.1  8.6  9.0   Hematocrit 39.0 - 52.0 % 24.6  27.3  27.5   Platelets 150 - 400 K/uL 120  396  295       Latest Ref Rng & Units 10/03/2021   10:54 AM 09/22/2021   12:15 PM 09/15/2021    1:29 PM  CMP  Glucose 70 - 99 mg/dL 98  94  94   BUN 8 - 23 mg/dL 46  31  32  Creatinine 0.61 - 1.24 mg/dL 2.47  2.29  2.42   Sodium 135 - 145 mmol/L 138  142  142   Potassium 3.5 - 5.1 mmol/L 4.6  4.3  4.8   Chloride 98 - 111 mmol/L 108  110  110   CO2 22 - 32 mmol/L 23  24  25    Calcium 8.9 - 10.3 mg/dL 8.8  8.9  8.7   Total Protein 6.5 - 8.1 g/dL 7.1  6.9  6.7   Total  Bilirubin 0.3 - 1.2 mg/dL 0.4  0.6  0.1   Alkaline Phos 38 - 126 U/L 57  53  54   AST 15 - 41 U/L 18  17  17    ALT 0 - 44 U/L 20  17  17      DIAGNOSTIC IMAGING:  I have independently reviewed the scans and discussed with the patient. CT CHEST ABDOMEN PELVIS WO CONTRAST  Result Date: 10/04/2021 CLINICAL DATA:  Squamous cell carcinoma of the left lung, prior chemo radiation. Renal insufficiency. * Tracking Code: BO * EXAM: CT CHEST, ABDOMEN AND PELVIS WITHOUT CONTRAST TECHNIQUE: Multidetector CT imaging of the chest, abdomen and pelvis was performed following the standard protocol without IV contrast. RADIATION DOSE REDUCTION: This exam was performed according to the departmental dose-optimization program which includes automated exposure control, adjustment of the mA and/or kV according to patient size and/or use of iterative reconstruction technique. COMPARISON:  Multiple exams, including 06/02/2021 FINDINGS: CT CHEST FINDINGS Cardiovascular: Left Port-A-Cath tip: SVC. Coronary, aortic arch, and branch vessel atherosclerotic vascular disease. Heart size within normal limits. Mediastinum/Nodes: Ill definition of mediastinal fat planes due to severe cachexia. Suspected thoracic adenopathy, right paratracheal adenopathy measuring 2.1 cm in short axis on image 23 series 2, previously 1.9 cm. Indistinctly marginated AP window adenopathy noted. As before there is irregularity of the carina with subcarinal adenopathy, estimated at 2.2 cm in short axis on image 34 series 2, stable. Lungs/Pleura: Biapical pleuroparenchymal scarring. Severe emphysema. Numerous scattered small nodules in the lungs are again observed, most below 5 mm in diameter. This pattern of scattered nodularity is similar to prior. The left lower lobe nodule measures 2.3 by 1.7 cm on image 111 series 3, formerly measured at 1.7 by 1.2 cm. Irregularity along the carina and proximal right and left bronchi on image 71 series 5, similar to previous.  Musculoskeletal: Sclerosis in the left eighth rib compatible with treated metastatic lesion. Sclerotic 1.9 by 1.0 cm lesion in the posterior left T11 vertebral body on image 94 series 6 compatible with metastatic lesion. CT ABDOMEN PELVIS FINDINGS Hepatobiliary: Scattered hypodense hepatic lesions are probably cysts. Faint dependent density in the gallbladder suspicious for small gallstones. Pancreas: Indistinctly marginated, no gross abnormality observed. Spleen: Unremarkable Adrenals/Urinary Tract: Indistinct adrenal glands, no obvious adrenal mass. Moderate to prominent right hydronephrosis and right hydroureter extending down into the pelvis, with distal right ureter indistinct. No urinary tract calculi identified. Stomach/Bowel: No dilated bowel.  Formed stool in the distal colon. Vascular/Lymphatic: Atherosclerosis is present, including aortoiliac atherosclerotic disease. Indistinctly marginated retroperitoneal adenopathy. Combined left lower periaortic adenopathy about 2.4 cm in thickness on image 77 series 2, previously 2.2 cm. Suspected increase in the aortocaval adenopathy on image 72 series 2. Reproductive: Indistinct margins of the prostate gland. Prostate tumor not excluded, correlate with PSA levels. Probable TURP in the past. Other: Marked paucity of fat planes in the abdomen due to cachexia. Musculoskeletal: Newly appreciable 1.0 cm sclerotic lesion in the right iliac crest on image  93 series 2 compatible with osseous metastatic lesion. Degenerative endplate findings at O1-3 and L5-S1. Increased conspicuity of a 5 mm sclerotic lesion in the L3 vertebral body on image 77 of series 6, compatible with a small metastatic lesion. IMPRESSION: 1. Multifocal sclerotic osseous metastatic disease including the left eighth rib, left T11 vertebral body, right iliac crest, and in the L3 vertebral body. 2. Enlarging left lower lobe pulmonary nodule. Suspected mildly increased adenopathy in the chest and abdomen  although measurements are difficult due to cachexia causing ill definition of margins of the lymph nodes. 3. Continued irregularity of the carina, cannot exclude invasion by underlying subcarinal adenopathy. 4. Moderate to prominent right hydronephrosis and right hydroureter extending down into the pelvis. Cause uncertain but presumably due to distal obstruction. There is some indistinctness of the margins of the prostate gland, strictly speaking prostate tumor cannot be excluded. 5. Other imaging findings of potential clinical significance: Aortic Atherosclerosis (ICD10-I70.0). Coronary atherosclerosis. Systemic atherosclerosis. Emphysema (ICD10-J43.9). Scattered subside mm size nodules throughout the lungs, unchanged. Electronically Signed   By: Van Clines M.D.   On: 10/04/2021 17:07     ASSESSMENT:  1.  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 -6 cycles of carboplatin, paclitaxel and pembrolizumab from 05/03/2018 through 08/21/2018. -Maintenance pembrolizumab started on 09/11/2018. -PET scan on 09/15/2019 showed interval decrease in hypermetabolic areas associated with tongue and floor of the mouth.  Hypermetabolic metastatic lymphadenopathy in the chest is stable.  No new sites seen. -PET scan on 03/15/2020 shows persistent, stable hypermetabolism in the tongue/floor of mouth.  Slight interval decrease in hypermetabolism with mediastinal/hilar adenopathy.  Persistent hypermetabolic focus in the right supraclavicular region.  New focus of hypermetabolic them identified in the right external iliac chain of pelvis with no discernible adenopathy on the CT. -CT CAP on 11/24/2020 showed left lower lobe lung nodule measuring 1.3 x 1.1 cm, previously 1.0 x 0.9 cm on CT scan from June.  Lesion was negative on PSMA PET scan.  Stable mediastinal lymph nodes. 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 - 6 cycles of carboplatin, paclitaxel and pembrolizumab from 01/12/2021 through 05/04/2021 with progression. - NGS test: No targetable mutations.  PD-L1 TPS is negative.  MSI-stable.  TMB-low. - Gemcitabine 2 weeks on/1 week off started on 07/05/2021.   2.  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  3.  Prostate cancer: - Evaluated by Dr. Alyson Ingles and biopsy was recommended.  Patient declined.  4.  Left kidney mass: - Biopsy of the left kidney mass was consistent with metastatic squamous cell carcinoma.   PLAN:  1.  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  Slightly enlarged left lower lobe pulmonary nodule and mildly increased adenopathy in the chest and abdomen.  Measurements are difficult due to cachexia causing ill-definition of margin of lymph nodes. - Based on these findings, I have recommended continuing gemcitabine at this time.  We will plan to do PSMA PET scan. - RTC 3 weeks for follow-up.   2.  Hypothyroidism: - Continue Synthroid 88 mcg daily.  TSH is 2.9.   3.  Bilateral knee pains: - Continue hydrocodone twice daily as needed.  4.  Nutrition: - He is drinking 2 ensures per day and eating well.  However he lost 7 pounds in the last 3 weeks.  We will closely monitor.   5.  Anemia/CKD: - Creatinine is 2.47 today.  We will closely monitor. - Ferritin is 313 and percent saturation 13.  Hemoglobin 8.1.  Will receive Venofer 400 mg IV x1 dose today.  6.  Prostate cancer Gleason 4+5=9: - His PSA increased to 99.7 on 10/03/2021. - He has new bone lesions which are sclerotic on the current CT CAP. - I have recommended PSMA PET scan.  He is open to [facility] antagonist treatment.   Orders placed this  encounter:  No orders of the defined types were placed in this encounter.    Derek Jack, MD [place] [phone]   I, Thana Ates, am acting as a scribe for Dr. Derek Jack.  I, Derek Jack MD, have reviewed the above documentation for accuracy and completeness, and I agree with the above.

## 2021-10-06 NOTE — Progress Notes (Signed)
Ok to treat with labs.  T.O. Dr Rhys Martini, PharmD

## 2021-10-06 NOTE — Progress Notes (Signed)
Patient presents today for Gemzar infusion.  Patient is in satisfactory condition with no new complaints voiced.  Vital signs are stable. Labs reviewed.  Creatinine today is 2.47.  MD aware.  All other labs are within treatment parameters.  Patient will also receive Venofer 400 mg x one dose today per Dr. Delton Coombes.  We will proceed with treatment per MD orders.

## 2021-10-06 NOTE — Patient Instructions (Signed)
[place]  Discharge Instructions: Thank you for choosing [facility] to provide your oncology and hematology care.  If you have a lab appointment with the [facility], please come in thru the Main Entrance and check in at the main information desk.  Wear comfortable clothing and clothing appropriate for easy access to any Portacath or PICC line.   We strive to give you quality time with your provider. You may need to reschedule your appointment if you arrive late (15 or more minutes).  Arriving late affects you and other patients whose appointments are after yours.  Also, if you miss three or more appointments without notifying the office, you may be dismissed from the clinic at the provider's discretion.      For prescription refill requests, have your pharmacy contact our office and allow 72 hours for refills to be completed.    Today you received the following chemotherapy and/or immunotherapy agents Gemzar & Venofer.   Iron Sucrose Injection What is this medication? IRON SUCROSE (EYE ern SOO krose) treats low levels of iron (iron deficiency anemia) in people with kidney disease. Iron is a mineral that plays an important role in making red blood cells, which carry oxygen from your lungs to the rest of your body. This medicine may be used for other purposes; ask your health care provider or pharmacist if you have questions. COMMON BRAND NAME(S): Venofer What should I tell my care team before I take this medication? They need to know if you have any of these conditions: Anemia not caused by low iron levels Heart disease High levels of iron in the blood Kidney disease Liver disease An unusual or allergic reaction to iron, other medications, foods, dyes, or preservatives Pregnant or trying to get pregnant Breast-feeding How should I use this medication? This medication is for infusion into a vein. It is given in a hospital or clinic setting. Talk to your  care team about the use of this medication in children. While this medication may be prescribed for children as young as 2 years for selected conditions, precautions do apply. Overdosage: If you think you have taken too much of this medicine contact a poison control center or emergency room at once. NOTE: This medicine is only for you. Do not share this medicine with others. What if I miss a dose? It is important not to miss your dose. Call your care team if you are unable to keep an appointment. What may interact with this medication? Do not take this medication with any of the following: Deferoxamine Dimercaprol Other iron products This medication may also interact with the following: Chloramphenicol Deferasirox This list may not describe all possible interactions. Give your health care provider a list of all the medicines, herbs, non-prescription drugs, or dietary supplements you use. Also tell them if you smoke, drink alcohol, or use illegal drugs. Some items may interact with your medicine. What should I watch for while using this medication? Visit your care team regularly. Tell your care team if your symptoms do not start to get better or if they get worse. You may need blood work done while you are taking this medication. You may need to follow a special diet. Talk to your care team. Foods that contain iron include: whole grains/cereals, dried fruits, beans, or peas, leafy green vegetables, and organ meats (liver, kidney). What side effects may I notice from receiving this medication? Side effects that you should report to your care team as  soon as possible: Allergic reactions--skin rash, itching, hives, swelling of the face, lips, tongue, or throat Low blood pressure--dizziness, feeling faint or lightheaded, blurry vision Shortness of breath Side effects that usually do not require medical attention (report to your care team if they continue or are  bothersome): Flushing Headache Joint pain Muscle pain Nausea Pain, redness, or irritation at injection site This list may not describe all possible side effects. Call your doctor for medical advice about side effects. You may report side effects to FDA at 1-800-FDA-1088. Where should I keep my medication? This medication is given in a hospital or clinic and will not be stored at home. NOTE: This sheet is a summary. It may not cover all possible information. If you have questions about this medicine, talk to your doctor, pharmacist, or health care provider.  2023 Elsevier/Gold Standard (2020-08-06 00:00:00)   Gemcitabine injection What is this medication? GEMCITABINE (jem SYE ta been) is a chemotherapy drug. This medicine is used to treat many types of cancer like breast cancer, lung cancer, pancreatic cancer, and ovarian cancer. This medicine may be used for other purposes; ask your health care provider or pharmacist if you have questions. COMMON BRAND NAME(S): Gemzar, Infugem What should I tell my care team before I take this medication? They need to know if you have any of these conditions: blood disorders infection kidney disease liver disease lung or breathing disease, like asthma recent or ongoing radiation therapy an unusual or allergic reaction to gemcitabine, other chemotherapy, other medicines, foods, dyes, or preservatives pregnant or trying to get pregnant breast-feeding How should I use this medication? This drug is given as an infusion into a vein. It is administered in a hospital or clinic by a specially trained health care professional. Talk to your pediatrician regarding the use of this medicine in children. Special care may be needed. Overdosage: If you think you have taken too much of this medicine contact a poison control center or emergency room at once. NOTE: This medicine is only for you. Do not share this medicine with others. What if I miss a dose? It is  important not to miss your dose. Call your doctor or health care professional if you are unable to keep an appointment. What may interact with this medication? medicines to increase blood counts like filgrastim, pegfilgrastim, sargramostim some other chemotherapy drugs like cisplatin vaccines Talk to your doctor or health care professional before taking any of these medicines: acetaminophen aspirin ibuprofen ketoprofen naproxen This list may not describe all possible interactions. Give your health care provider a list of all the medicines, herbs, non-prescription drugs, or dietary supplements you use. Also tell them if you smoke, drink alcohol, or use illegal drugs. Some items may interact with your medicine. What should I watch for while using this medication? Visit your doctor for checks on your progress. This drug may make you feel generally unwell. This is not uncommon, as chemotherapy can affect healthy cells as well as cancer cells. Report any side effects. Continue your course of treatment even though you feel ill unless your doctor tells you to stop. In some cases, you may be given additional medicines to help with side effects. Follow all directions for their use. Call your doctor or health care professional for advice if you get a fever, chills or sore throat, or other symptoms of a cold or flu. Do not treat yourself. This drug decreases your body's ability to fight infections. Try to avoid being around people who are  sick. This medicine may increase your risk to bruise or bleed. Call your doctor or health care professional if you notice any unusual bleeding. Be careful brushing and flossing your teeth or using a toothpick because you may get an infection or bleed more easily. If you have any dental work done, tell your dentist you are receiving this medicine. Avoid taking products that contain aspirin, acetaminophen, ibuprofen, naproxen, or ketoprofen unless instructed by your doctor.  These medicines may hide a fever. Do not become pregnant while taking this medicine or for 6 months after stopping it. Women should inform their doctor if they wish to become pregnant or think they might be pregnant. Men should not father a child while taking this medicine and for 3 months after stopping it. There is a potential for serious side effects to an unborn child. Talk to your health care professional or pharmacist for more information. Do not breast-feed an infant while taking this medicine or for at least 1 week after stopping it. Men should inform their doctors if they wish to father a child. This medicine may lower sperm counts. Talk with your doctor or health care professional if you are concerned about your fertility. What side effects may I notice from receiving this medication? Side effects that you should report to your doctor or health care professional as soon as possible: allergic reactions like skin rash, itching or hives, swelling of the face, lips, or tongue breathing problems pain, redness, or irritation at site where injected signs and symptoms of a dangerous change in heartbeat or heart rhythm like chest pain; dizziness; fast or irregular heartbeat; palpitations; feeling faint or lightheaded, falls; breathing problems signs of decreased platelets or bleeding - bruising, pinpoint red spots on the skin, black, tarry stools, blood in the urine signs of decreased red blood cells - unusually weak or tired, feeling faint or lightheaded, falls signs of infection - fever or chills, cough, sore throat, pain or difficulty passing urine signs and symptoms of kidney injury like trouble passing urine or change in the amount of urine signs and symptoms of liver injury like dark yellow or Janesia Joswick urine; general ill feeling or flu-like symptoms; light-colored stools; loss of appetite; nausea; right upper belly pain; unusually weak or tired; yellowing of the eyes or skin swelling of ankles,  feet, hands Side effects that usually do not require medical attention (report to your doctor or health care professional if they continue or are bothersome): constipation diarrhea hair loss loss of appetite nausea rash vomiting This list may not describe all possible side effects. Call your doctor for medical advice about side effects. You may report side effects to FDA at 1-800-FDA-1088. Where should I keep my medication? This drug is given in a hospital or clinic and will not be stored at home. NOTE: This sheet is a summary. It may not cover all possible information. If you have questions about this medicine, talk to your doctor, pharmacist, or health care provider.  2023 Elsevier/Gold Standard (2017-06-06 00:00:00)        To help prevent nausea and vomiting after your treatment, we encourage you to take your nausea medication as directed.  BELOW ARE SYMPTOMS THAT SHOULD BE REPORTED IMMEDIATELY: *FEVER GREATER THAN 100.4 F (38 C) OR HIGHER *CHILLS OR SWEATING *NAUSEA AND VOMITING THAT IS NOT CONTROLLED WITH YOUR NAUSEA MEDICATION *UNUSUAL SHORTNESS OF BREATH *UNUSUAL BRUISING OR BLEEDING *URINARY PROBLEMS (pain or burning when urinating, or frequent urination) *BOWEL PROBLEMS (unusual diarrhea, constipation, pain near the  anus) TENDERNESS IN MOUTH AND THROAT WITH OR WITHOUT PRESENCE OF ULCERS (sore throat, sores in mouth, or a toothache) UNUSUAL RASH, SWELLING OR PAIN  UNUSUAL VAGINAL DISCHARGE OR ITCHING   Items with * indicate a potential emergency and should be followed up as soon as possible or go to the Emergency Department if any problems should occur.  Please show the CHEMOTHERAPY ALERT CARD or IMMUNOTHERAPY ALERT CARD at check-in to the Emergency Department and triage nurse.  Should you have questions after your visit or need to cancel or reschedule your appointment, please contact [facility] [phone]  and follow the prompts.  Office hours are 8:00  a.m. to 4:30 p.m. Monday - Friday. Please note that voicemails left after 4:00 p.m. may not be returned until the following business day.  We are closed weekends and major holidays. You have access to a nurse at all times for urgent questions. Please call the main number to the clinic [phone] and follow the prompts.  For any non-urgent questions, you may also contact your provider using MyChart. We now offer e-Visits for anyone 70 and older to request care online for non-urgent symptoms. For details visit [email or web address].   Also download the MyChart app! Go to the app store, search "MyChart", open the app, select [facility], and log in with your MyChart username and password.  Masks are optional in the cancer centers. If you would like for your care team to wear a mask while they are taking care of you, please let them know. For doctor visits, patients may have with them one support person who is at least 72 years old. At this time, visitors are not allowed in the infusion area.

## 2021-10-06 NOTE — Progress Notes (Signed)
Patient stated he does not want to stay the 30 minute post wait time for iron infusion.  Reviewed side effects and precautions and when to report to the ER with understanding verbalized.    Patient tolerated iron infusion with no complaints voiced. Port site clean and dry with good blood return noted before and after infusion.  Band aid applied.  VSS with discharge and left in satisfactory condition with no s/s of distress noted.

## 2021-10-06 NOTE — Patient Instructions (Addendum)
[place] at [facility] Discharge Instructions   You were seen and examined today by Dr. Delton Coombes.  He reviewed the results of your CT scan. It shows that the cancer in your lung has enlarged slightly. There is a new bone lesion that is showing up in your pelvis. 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   There is a special scan that we can do to determine if the new bone lesions are coming from the prostate cancer.  We will proceed with your treatment today.  Return as scheduled.    Thank you for choosing [facility] [place] at [facility] to provide your oncology and hematology care.  To afford each patient quality time with our provider, please arrive at least 15 minutes before your scheduled appointment time.   If you have a lab appointment with the [facility] please come in thru the Main Entrance and check in at the main information desk.  You need to re-schedule your appointment should you arrive 10 or more minutes late.  We strive to give you quality time with our providers, and arriving late affects you and other patients whose appointments are after yours.  Also, if you no show three or more times for appointments you may be dismissed from the clinic at the providers discretion.     Again, thank you for choosing [facility].  Our hope is that these requests will decrease the amount of time that you wait before being seen by our physicians.       _____________________________________________________________  Should you have questions after your visit to [facility], please contact our office at [phone] and follow the prompts.  Our office hours are 8:00 a.m. and 4:30 p.m. Monday - Friday.  Please note that voicemails left after 4:00 p.m. may not be returned until the following business day.  We are closed weekends and major holidays.  You do have access to a nurse  24-7, just call the main number to the clinic [phone] and do not press any options, hold on the line and a nurse will answer the phone.    For prescription refill requests, have your pharmacy contact our office and allow 72 hours.    Due to Covid, you will need to wear a mask upon entering the hospital. If you do not have a mask, a mask will be given to you at the Main Entrance upon arrival. For doctor visits, patients may have 1 support person age 47 or older with them. For treatment visits, patients can not have anyone with them due to social distancing guidelines and our immunocompromised population.

## 2021-10-13 ENCOUNTER — Inpatient Hospital Stay (HOSPITAL_COMMUNITY): Payer: Medicare Other

## 2021-10-13 ENCOUNTER — Encounter (HOSPITAL_COMMUNITY)
Admission: RE | Admit: 2021-10-13 | Discharge: 2021-10-13 | Disposition: A | Discharge status: Home / Self Care | Payer: Medicare Other | Source: Ambulatory Visit | Attending: Hematology | Admitting: Hematology

## 2021-10-13 DIAGNOSIS — C61 Malignant neoplasm of prostate: Secondary | ICD-10-CM | POA: Diagnosis present

## 2021-10-13 MED ORDER — PIFLIFOLASTAT F 18 (PYLARIFY) INJECTION
9.0000 | Freq: Once | INTRAVENOUS | Status: AC
  Administered 2021-10-13: 8.31 via INTRAVENOUS

## 2021-10-14 ENCOUNTER — Encounter (HOSPITAL_COMMUNITY): Payer: Self-pay

## 2021-10-14 ENCOUNTER — Inpatient Hospital Stay (HOSPITAL_COMMUNITY): Payer: Medicare Other

## 2021-10-14 VITALS — BP 139/69 | HR 77 | Temp 98.2°F | Resp 18

## 2021-10-14 DIAGNOSIS — Z5111 Encounter for antineoplastic chemotherapy: Secondary | ICD-10-CM | POA: Diagnosis not present

## 2021-10-14 DIAGNOSIS — C3492 Malignant neoplasm of unspecified part of left bronchus or lung: Secondary | ICD-10-CM

## 2021-10-14 DIAGNOSIS — C109 Malignant neoplasm of oropharynx, unspecified: Secondary | ICD-10-CM

## 2021-10-14 LAB — CBC WITH DIFFERENTIAL/PLATELET
Abs Immature Granulocytes: 0.02 10*3/uL (ref 0.00–0.07)
Basophils Absolute: 0 10*3/uL (ref 0.0–0.1)
Basophils Relative: 1 %
Eosinophils Absolute: 0 10*3/uL (ref 0.0–0.5)
Eosinophils Relative: 0 %
HCT: 25.3 % — ABNORMAL LOW (ref 39.0–52.0)
Hemoglobin: 8.2 g/dL — ABNORMAL LOW (ref 13.0–17.0)
Immature Granulocytes: 1 %
Lymphocytes Relative: 25 %
Lymphs Abs: 0.8 10*3/uL (ref 0.7–4.0)
MCH: 33.2 pg (ref 26.0–34.0)
MCHC: 32.4 g/dL (ref 30.0–36.0)
MCV: 102.4 fL — ABNORMAL HIGH (ref 80.0–100.0)
Monocytes Absolute: 0.7 10*3/uL (ref 0.1–1.0)
Monocytes Relative: 21 %
Neutro Abs: 1.8 10*3/uL (ref 1.7–7.7)
Neutrophils Relative %: 52 %
Platelets: 343 10*3/uL (ref 150–400)
RBC: 2.47 MIL/uL — ABNORMAL LOW (ref 4.22–5.81)
RDW: 22.6 % — ABNORMAL HIGH (ref 11.5–15.5)
WBC: 3.4 10*3/uL — ABNORMAL LOW (ref 4.0–10.5)
nRBC: 0 % (ref 0.0–0.2)

## 2021-10-14 LAB — COMPREHENSIVE METABOLIC PANEL
ALT: 25 U/L (ref 0–44)
AST: 23 U/L (ref 15–41)
Albumin: 3.5 g/dL (ref 3.5–5.0)
Alkaline Phosphatase: 61 U/L (ref 38–126)
Anion gap: 6 (ref 5–15)
BUN: 37 mg/dL — ABNORMAL HIGH (ref 8–23)
CO2: 24 mmol/L (ref 22–32)
Calcium: 8.6 mg/dL — ABNORMAL LOW (ref 8.9–10.3)
Chloride: 110 mmol/L (ref 98–111)
Creatinine, Ser: 2.55 mg/dL — ABNORMAL HIGH (ref 0.61–1.24)
GFR, Estimated: 26 mL/min — ABNORMAL LOW (ref >60)
Glucose, Bld: 98 mg/dL (ref 70–99)
Potassium: 4.9 mmol/L (ref 3.5–5.1)
Sodium: 140 mmol/L (ref 135–145)
Total Bilirubin: 0.3 mg/dL (ref 0.3–1.2)
Total Protein: 6.8 g/dL (ref 6.5–8.1)

## 2021-10-14 LAB — MAGNESIUM: Magnesium: 2.1 mg/dL (ref 1.7–2.4)

## 2021-10-14 MED ORDER — SODIUM CHLORIDE 0.9% FLUSH
10.0000 mL | INTRAVENOUS | Status: DC | PRN
  Administered 2021-10-14: 10 mL

## 2021-10-14 MED ORDER — PALONOSETRON HCL INJECTION 0.25 MG/5ML
0.2500 mg | Freq: Once | INTRAVENOUS | Status: AC
  Administered 2021-10-14: 0.25 mg via INTRAVENOUS

## 2021-10-14 MED ORDER — SODIUM CHLORIDE 0.9 % IV SOLN
750.0000 mg/m2 | Freq: Once | INTRAVENOUS | Status: AC
  Administered 2021-10-14: 1292 mg via INTRAVENOUS
  Filled 2021-10-14: qty 26.3

## 2021-10-14 MED ORDER — SODIUM CHLORIDE 0.9 % IV SOLN: Freq: Once | INTRAVENOUS | Status: AC

## 2021-10-14 MED ORDER — HEPARIN SOD (PORK) LOCK FLUSH 100 UNIT/ML IV SOLN
500.0000 [IU] | Freq: Once | INTRAVENOUS | Status: AC | PRN
  Administered 2021-10-14: 500 [IU]

## 2021-10-14 NOTE — Progress Notes (Signed)
Patient presents today for lab work and possible treatment. Patient has no complaints today.  MAR reviewed and updated. Patient denies any side effects related to his last treatment.   Creatinine 2.55. Reported to [place] by secure chat.   Verbal orders received from Dr. Delton Coombes to proceed with treatment. 500 ml bolus of Normal Saline to be infused after Gemzar.   Gemzar given today per MD orders. Tolerated infusion without adverse affects. Vital signs stable. No complaints at this time. Discharged from clinic ambulatory in stable condition. Alert and oriented x 3. F/U with [facility] as scheduled.

## 2021-10-14 NOTE — Patient Instructions (Signed)
[facility]  Discharge Instructions: Thank you for choosing [facility] to provide your oncology and hematology care.  If you have a lab appointment with the [facility], please come in thru the Main Entrance and check in at the main information desk.  Wear comfortable clothing and clothing appropriate for easy access to any Portacath or PICC line.   We strive to give you quality time with your provider. You may need to reschedule your appointment if you arrive late (15 or more minutes).  Arriving late affects you and other patients whose appointments are after yours.  Also, if you miss three or more appointments without notifying the office, you may be dismissed from the clinic at the provider's discretion.      For prescription refill requests, have your pharmacy contact our office and allow 72 hours for refills to be completed.    Today you received the following chemotherapy and/or immunotherapy agents Gemzar.  Gemcitabine injection What is this medication? GEMCITABINE (jem SYE ta been) is a chemotherapy drug. This medicine is used to treat many types of cancer like breast cancer, lung cancer, pancreatic cancer, and ovarian cancer. This medicine may be used for other purposes; ask your health care provider or pharmacist if you have questions. COMMON BRAND NAME(S): Gemzar, Infugem What should I tell my care team before I take this medication? They need to know if you have any of these conditions: blood disorders infection kidney disease liver disease lung or breathing disease, like asthma recent or ongoing radiation therapy an unusual or allergic reaction to gemcitabine, other chemotherapy, other medicines, foods, dyes, or preservatives pregnant or trying to get pregnant breast-feeding How should I use this medication? This drug is given as an infusion into a vein. It is administered in a hospital or clinic by a specially trained health care  professional. Talk to your pediatrician regarding the use of this medicine in children. Special care may be needed. Overdosage: If you think you have taken too much of this medicine contact a poison control center or emergency room at once. NOTE: This medicine is only for you. Do not share this medicine with others. What if I miss a dose? It is important not to miss your dose. Call your doctor or health care professional if you are unable to keep an appointment. What may interact with this medication? medicines to increase blood counts like filgrastim, pegfilgrastim, sargramostim some other chemotherapy drugs like cisplatin vaccines Talk to your doctor or health care professional before taking any of these medicines: acetaminophen aspirin ibuprofen ketoprofen naproxen This list may not describe all possible interactions. Give your health care provider a list of all the medicines, herbs, non-prescription drugs, or dietary supplements you use. Also tell them if you smoke, drink alcohol, or use illegal drugs. Some items may interact with your medicine. What should I watch for while using this medication? Visit your doctor for checks on your progress. This drug may make you feel generally unwell. This is not uncommon, as chemotherapy can affect healthy cells as well as cancer cells. Report any side effects. Continue your course of treatment even though you feel ill unless your doctor tells you to stop. In some cases, you may be given additional medicines to help with side effects. Follow all directions for their use. Call your doctor or health care professional for advice if you get a fever, chills or sore throat, or other symptoms of a cold or flu. Do not treat yourself.  This drug decreases your body's ability to fight infections. Try to avoid being around people who are sick. This medicine may increase your risk to bruise or bleed. Call your doctor or health care professional if you notice any  unusual bleeding. Be careful brushing and flossing your teeth or using a toothpick because you may get an infection or bleed more easily. If you have any dental work done, tell your dentist you are receiving this medicine. Avoid taking products that contain aspirin, acetaminophen, ibuprofen, naproxen, or ketoprofen unless instructed by your doctor. These medicines may hide a fever. Do not become pregnant while taking this medicine or for 6 months after stopping it. Women should inform their doctor if they wish to become pregnant or think they might be pregnant. Men should not father a child while taking this medicine and for 3 months after stopping it. There is a potential for serious side effects to an unborn child. Talk to your health care professional or pharmacist for more information. Do not breast-feed an infant while taking this medicine or for at least 1 week after stopping it. Men should inform their doctors if they wish to father a child. This medicine may lower sperm counts. Talk with your doctor or health care professional if you are concerned about your fertility. What side effects may I notice from receiving this medication? Side effects that you should report to your doctor or health care professional as soon as possible: allergic reactions like skin rash, itching or hives, swelling of the face, lips, or tongue breathing problems pain, redness, or irritation at site where injected signs and symptoms of a dangerous change in heartbeat or heart rhythm like chest pain; dizziness; fast or irregular heartbeat; palpitations; feeling faint or lightheaded, falls; breathing problems signs of decreased platelets or bleeding - bruising, pinpoint red spots on the skin, black, tarry stools, blood in the urine signs of decreased red blood cells - unusually weak or tired, feeling faint or lightheaded, falls signs of infection - fever or chills, cough, sore throat, pain or difficulty passing urine signs  and symptoms of kidney injury like trouble passing urine or change in the amount of urine signs and symptoms of liver injury like dark yellow or brown urine; general ill feeling or flu-like symptoms; light-colored stools; loss of appetite; nausea; right upper belly pain; unusually weak or tired; yellowing of the eyes or skin swelling of ankles, feet, hands Side effects that usually do not require medical attention (report to your doctor or health care professional if they continue or are bothersome): constipation diarrhea hair loss loss of appetite nausea rash vomiting This list may not describe all possible side effects. Call your doctor for medical advice about side effects. You may report side effects to FDA at 1-800-FDA-1088. Where should I keep my medication? This drug is given in a hospital or clinic and will not be stored at home. NOTE: This sheet is a summary. It may not cover all possible information. If you have questions about this medicine, talk to your doctor, pharmacist, or health care provider.  2023 Elsevier/Gold Standard (2017-06-06 00:00:00)       To help prevent nausea and vomiting after your treatment, we encourage you to take your nausea medication as directed.  BELOW ARE SYMPTOMS THAT SHOULD BE REPORTED IMMEDIATELY: *FEVER GREATER THAN 100.4 F (38 C) OR HIGHER *CHILLS OR SWEATING *NAUSEA AND VOMITING THAT IS NOT CONTROLLED WITH YOUR NAUSEA MEDICATION *UNUSUAL SHORTNESS OF BREATH *UNUSUAL BRUISING OR BLEEDING *URINARY PROBLEMS (  pain or burning when urinating, or frequent urination) *BOWEL PROBLEMS (unusual diarrhea, constipation, pain near the anus) TENDERNESS IN MOUTH AND THROAT WITH OR WITHOUT PRESENCE OF ULCERS (sore throat, sores in mouth, or a toothache) UNUSUAL RASH, SWELLING OR PAIN  UNUSUAL VAGINAL DISCHARGE OR ITCHING   Items with * indicate a potential emergency and should be followed up as soon as possible or go to the Emergency Department if any  problems should occur.  Please show the CHEMOTHERAPY ALERT CARD or IMMUNOTHERAPY ALERT CARD at check-in to the Emergency Department and triage nurse.  Should you have questions after your visit or need to cancel or reschedule your appointment, please contact [facility] [phone]  and follow the prompts.  Office hours are 8:00 a.m. to 4:30 p.m. Monday - Friday. Please note that voicemails left after 4:00 p.m. may not be returned until the following business day.  We are closed weekends and major holidays. You have access to a nurse at all times for urgent questions. Please call the main number to the clinic [phone] and follow the prompts.  For any non-urgent questions, you may also contact your provider using MyChart. We now offer e-Visits for anyone 86 and older to request care online for non-urgent symptoms. For details visit [email or web address].   Also download the MyChart app! Go to the app store, search "MyChart", open the app, select [facility], and log in with your MyChart username and password.  Masks are optional in the cancer centers. If you would like for your care team to wear a mask while they are taking care of you, please let them know. For doctor visits, patients may have with them one support person who is at least 72 years old. At this time, visitors are not allowed in the infusion area.

## 2021-10-17 ENCOUNTER — Other Ambulatory Visit: Payer: Self-pay

## 2021-10-18 ENCOUNTER — Other Ambulatory Visit: Payer: Self-pay

## 2021-10-21 ENCOUNTER — Ambulatory Visit (INDEPENDENT_AMBULATORY_CARE_PROVIDER_SITE_OTHER): Payer: Medicare Other | Admitting: Urology

## 2021-10-21 VITALS — BP 111/57 | HR 81

## 2021-10-21 DIAGNOSIS — N138 Other obstructive and reflux uropathy: Secondary | ICD-10-CM

## 2021-10-21 DIAGNOSIS — N401 Enlarged prostate with lower urinary tract symptoms: Secondary | ICD-10-CM

## 2021-10-21 DIAGNOSIS — N5201 Erectile dysfunction due to arterial insufficiency: Secondary | ICD-10-CM

## 2021-10-21 DIAGNOSIS — R339 Retention of urine, unspecified: Secondary | ICD-10-CM

## 2021-10-21 LAB — MICROSCOPIC EXAMINATION
Epithelial Cells (non renal): NONE SEEN /hpf (ref 0–10)
Renal Epithel, UA: NONE SEEN /hpf
WBC, UA: >30 /hpf — AB (ref 0–5)

## 2021-10-21 LAB — URINALYSIS, ROUTINE W REFLEX MICROSCOPIC
Bilirubin, UA: NEGATIVE
Glucose, UA: NEGATIVE
Ketones, UA: NEGATIVE
Nitrite, UA: POSITIVE — AB
Specific Gravity, UA: 1.02 (ref 1.005–1.030)
Urobilinogen, Ur: 0.2 mg/dL (ref 0.2–1.0)
pH, UA: 5 (ref 5.0–7.5)

## 2021-10-21 LAB — BLADDER SCAN AMB NON-IMAGING: Scan Result: 12

## 2021-10-21 MED ORDER — SILDENAFIL CITRATE 100 MG PO TABS: 100.0000 mg | ORAL_TABLET | ORAL | 6 refills | Status: AC | PRN

## 2021-10-21 NOTE — Progress Notes (Unsigned)
10/21/2021 10:08 AM   Alexander [name] 03/07/1951 [id number]  Referring provider: Lemmie Evens, [name] [place] [address],  [place] [address]  Followup urinary retention   HPI: Alexander [name] is a 71yo here for folowup for BPH and urinary retention. IPSS 6 QOL 0. PVR 12cc. His urine stream is strong. No straining to urinate. He has erectile dysfunction and tadalafil does not give him a firm erection. 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 3 weeks ago.- Alexander [name].   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 BOT    Surgical History: Past Surgical History:  Procedure Laterality Date   BIOPSY  01/15/2018   Procedure: BIOPSY;  Surgeon: Alexander Binder, [name];  [facility];  Service: Endoscopy;;  gastric   COLONOSCOPY N/A 03/13/2016   Procedure: COLONOSCOPY;  Surgeon: Alexander Binder, [name];  [facility];  Service: Endoscopy;  Laterality: N/A;  2:15 PM   CYSTOSCOPY N/A 08/29/2021   Procedure: CYSTOSCOPY;  Surgeon: Alexander [name];  Location: [facility];  Service: Urology;  Laterality: N/A;   CYSTOSCOPY WITH INSERTION OF UROLIFT N/A 06/27/2021   Procedure: CYSTOSCOPY WITH INSERTION OF UROLIFT;  Surgeon: Alexander [name];  Location: [facility];  Service: Urology;  Laterality: N/A;   ESOPHAGOGASTRODUODENOSCOPY (EGD) WITH PROPOFOL N/A 08/17/2014   Procedure: ESOPHAGOGASTRODUODENOSCOPY (EGD) WITH PROPOFOL (procedure #1);  Surgeon: Alexander [name];  Location: [facility];  Service: General;  Laterality: N/A;   ESOPHAGOGASTRODUODENOSCOPY (EGD) WITH PROPOFOL N/A 01/15/2018   Procedure: ESOPHAGOGASTRODUODENOSCOPY (EGD) WITH PROPOFOL;  Surgeon: Alexander Binder, [name];  [facility];  Service: Endoscopy;   Laterality: N/A;  9:30am   MULTIPLE EXTRACTIONS WITH ALVEOLOPLASTY N/A 08/12/2014   Procedure: Extraction of tooth #'s 6,17,22,23,24,25,26,27 with alveoloplasty;  Surgeon: Alexander [name], DDS;  Location: [facility];  Service: Oral Surgery;  Laterality: N/A;   PANENDOSCOPY N/A 08/06/2014   Procedure: PANENDOSCOPY WITH BIOPSY;  Surgeon: Alexander Baptist, [name];  Location: [facility];  Service: ENT;  Laterality: N/A;   PEG PLACEMENT Left 08/17/14   PEG PLACEMENT N/A 08/17/2014   Procedure: PERCUTANEOUS ENDOSCOPIC GASTROSTOMY (PEG) PLACEMENT (procedure #1);  Surgeon: Alexander [name];  Location: [facility];  Service: General;  Laterality: N/A;   PORT-A-CATH REMOVAL Right 07/17/2016   Procedure: MINOR REMOVAL PORT-A-CATH;  Surgeon: Alexander [name];  [facility];  Service: General;  Laterality: Right;   PORTACATH PLACEMENT Right 08/17/14   PORTACATH PLACEMENT Right 08/17/2014   Procedure: INSERTION PORT-A-CATH (procedure #2);  Surgeon: Alexander [name];  [facility];  Service: General;  Laterality: Right;   PORTACATH PLACEMENT Left 04/26/2018   Procedure: INSERTION PORT-A-CATH (attached catheter in left subclavian);  Surgeon: Alexander [name];  Location: [facility];  Service: General;  Laterality: Left;   SAVORY DILATION N/A 01/15/2018   Procedure: SAVORY DILATION;  Surgeon: Alexander Binder, [name];  [facility];  Service: Endoscopy;  Laterality: N/A;   TRANSURETHRAL RESECTION OF PROSTATE N/A 08/29/2021   Procedure: TRANSURETHRAL RESECTION OF THE PROSTATE (TURP);  Surgeon: Alexander [name];  [facility];  Service: Urology;  Laterality: N/A;   VIDEO BRONCHOSCOPY WITH ENDOBRONCHIAL ULTRASOUND N/A 04/15/2018  Procedure: VIDEO BRONCHOSCOPY WITH ENDOBRONCHIAL ULTRASOUND;  Surgeon: Alexander Nakayama, [name];  Location: [facility];  Service: Thoracic;  Laterality: N/A;    Home Medications:  Allergies as of 10/21/2021   No Known Allergies      Medication List        Accurate as of  October 21, 2021 10:08 AM. If you have any questions, ask your nurse or doctor.          GEMZAR IV Inject into the vein once a week. Days 1 & 8 q 21 days   HYDROcodone-acetaminophen 10-325 MG tablet Commonly known as: NORCO TAKE 1 TABLET EVERY 12 HOURS AS NEEDED.   levothyroxine 88 MCG tablet Commonly known as: SYNTHROID TAKE ONE TABLET BY MOUTH DAILY BEFORE BREAKFAST   naproxen sodium 220 MG tablet Commonly known as: ALEVE Take 220 mg by mouth daily as needed (pain.).   omeprazole 20 MG capsule Commonly known as: PRILOSEC Take 1 capsule (20 mg total) by mouth daily.   tadalafil 20 MG tablet Commonly known as: CIALIS Take 1 tablet (20 mg total) by mouth daily as needed.   tamsulosin 0.4 MG Caps capsule Commonly known as: FLOMAX Take 1 capsule (0.4 mg total) by mouth 2 (two) times daily.        Allergies: No Known Allergies  Family History: Family History  Problem Relation Age of Onset   Colon cancer Neg Hx    Gastric cancer Neg Hx    Esophageal cancer Neg Hx     Social History:  reports that he quit smoking about 7 years ago. His smoking use included cigarettes. He has a 15.00 pack-year smoking history. He has never used smokeless tobacco. He reports that he does not currently use alcohol. He reports that he does not use drugs.  ROS: All other review of systems were reviewed and are negative except what is noted above in HPI  Physical Exam: BP (!) 111/57   Pulse 81   Constitutional:  Alert and oriented, No acute distress. HEENT: [place] AT, moist mucus membranes.  Trachea midline, no masses. Cardiovascular: No clubbing, cyanosis, or edema. Respiratory: Normal respiratory effort, no increased work of breathing. GI: Abdomen is soft, nontender, nondistended, no abdominal masses GU: No CVA tenderness.  Lymph: No cervical or inguinal lymphadenopathy. Skin: No rashes, bruises or suspicious lesions. Neurologic: Grossly intact, no focal deficits, moving all 4  extremities. Psychiatric: Normal mood and affect.  Laboratory Data: Lab Results  Component Value Date   WBC 3.4 (L) 10/14/2021   HGB 8.2 (L) 10/14/2021   HCT 25.3 (L) 10/14/2021   MCV 102.4 (H) 10/14/2021   PLT 343 10/14/2021    Lab Results  Component Value Date   CREATININE 2.55 (H) 10/14/2021    No results found for: "PSA"  No results found for: "TESTOSTERONE"  No results found for: "HGBA1C"  Urinalysis    Component Value Date/Time   COLORURINE BROWN (A) 08/20/2021 1258   APPEARANCEUR CLOUDY (A) 08/20/2021 1258   APPEARANCEUR Clear 12/15/2020 1008   LABSPEC 1.006 08/20/2021 1258   PHURINE 6.0 08/20/2021 1258   GLUCOSEU NEGATIVE 08/20/2021 1258   HGBUR LARGE (A) 08/20/2021 1258   BILIRUBINUR NEGATIVE 08/20/2021 1258   BILIRUBINUR Negative 12/15/2020 1008   KETONESUR NEGATIVE 08/20/2021 1258   PROTEINUR 100 (A) 08/20/2021 1258   NITRITE POSITIVE (A) 08/20/2021 1258   LEUKOCYTESUR MODERATE (A) 08/20/2021 1258    Lab Results  Component Value Date   LABMICR See below: 12/15/2020   WBCUA 0-5 12/15/2020  LABEPIT 0-10 12/15/2020   BACTERIA NONE SEEN 08/20/2021    Pertinent Imaging:  No results found for this or any previous visit.  No results found for this or any previous visit.  No results found for this or any previous visit.  No results found for this or any previous visit.  No results found for this or any previous visit.  No results found for this or any previous visit.  No results found for this or any previous visit.  No results found for this or any previous visit.   Assessment & Plan:    1. Benign prostatic hyperplasia with urinary obstruction -improved after surgery - Urinalysis, Routine w reflex microscopic - BLADDER SCAN AMB NON-IMAGING  2. Urinary retention -resolved  3. Erectile dysfunction due to arterial insufficiency -sildenafil 100mg  prn   No follow-ups on file.  [name], [name]  [facility]

## 2021-10-22 ENCOUNTER — Other Ambulatory Visit: Payer: Self-pay

## 2021-10-27 ENCOUNTER — Telehealth: Payer: Self-pay | Admitting: Pharmacy Technician

## 2021-10-27 ENCOUNTER — Inpatient Hospital Stay: Payer: Medicare Other

## 2021-10-27 ENCOUNTER — Inpatient Hospital Stay: Payer: Medicare Other | Attending: Hematology | Admitting: Hematology

## 2021-10-27 ENCOUNTER — Other Ambulatory Visit (HOSPITAL_COMMUNITY): Payer: Self-pay

## 2021-10-27 ENCOUNTER — Encounter: Payer: Self-pay | Admitting: Urology

## 2021-10-27 ENCOUNTER — Encounter: Payer: Self-pay | Admitting: Hematology

## 2021-10-27 VITALS — BP 92/57 | HR 71 | Temp 97.3°F | Resp 18 | Ht 73.0 in | Wt 128.8 lb

## 2021-10-27 DIAGNOSIS — Z87891 Personal history of nicotine dependence: Secondary | ICD-10-CM | POA: Insufficient documentation

## 2021-10-27 DIAGNOSIS — C61 Malignant neoplasm of prostate: Secondary | ICD-10-CM | POA: Insufficient documentation

## 2021-10-27 DIAGNOSIS — C109 Malignant neoplasm of oropharynx, unspecified: Secondary | ICD-10-CM | POA: Diagnosis not present

## 2021-10-27 DIAGNOSIS — Z79890 Hormone replacement therapy (postmenopausal): Secondary | ICD-10-CM | POA: Diagnosis not present

## 2021-10-27 DIAGNOSIS — R7989 Other specified abnormal findings of blood chemistry: Secondary | ICD-10-CM | POA: Diagnosis not present

## 2021-10-27 DIAGNOSIS — Z85810 Personal history of malignant neoplasm of tongue: Secondary | ICD-10-CM | POA: Insufficient documentation

## 2021-10-27 DIAGNOSIS — E039 Hypothyroidism, unspecified: Secondary | ICD-10-CM | POA: Insufficient documentation

## 2021-10-27 DIAGNOSIS — C7902 Secondary malignant neoplasm of left kidney and renal pelvis: Secondary | ICD-10-CM | POA: Insufficient documentation

## 2021-10-27 DIAGNOSIS — M25562 Pain in left knee: Secondary | ICD-10-CM | POA: Insufficient documentation

## 2021-10-27 DIAGNOSIS — D631 Anemia in chronic kidney disease: Secondary | ICD-10-CM | POA: Diagnosis not present

## 2021-10-27 DIAGNOSIS — N189 Chronic kidney disease, unspecified: Secondary | ICD-10-CM | POA: Diagnosis not present

## 2021-10-27 DIAGNOSIS — M25561 Pain in right knee: Secondary | ICD-10-CM | POA: Diagnosis not present

## 2021-10-27 DIAGNOSIS — Z9221 Personal history of antineoplastic chemotherapy: Secondary | ICD-10-CM | POA: Diagnosis not present

## 2021-10-27 DIAGNOSIS — Z923 Personal history of irradiation: Secondary | ICD-10-CM | POA: Diagnosis not present

## 2021-10-27 DIAGNOSIS — C3492 Malignant neoplasm of unspecified part of left bronchus or lung: Secondary | ICD-10-CM | POA: Insufficient documentation

## 2021-10-27 LAB — COMPREHENSIVE METABOLIC PANEL
ALT: 13 U/L (ref 0–44)
AST: 18 U/L (ref 15–41)
Albumin: 3.8 g/dL (ref 3.5–5.0)
Alkaline Phosphatase: 61 U/L (ref 38–126)
Anion gap: 8 (ref 5–15)
BUN: 43 mg/dL — ABNORMAL HIGH (ref 8–23)
CO2: 20 mmol/L — ABNORMAL LOW (ref 22–32)
Calcium: 9 mg/dL (ref 8.9–10.3)
Chloride: 110 mmol/L (ref 98–111)
Creatinine, Ser: 3.61 mg/dL — ABNORMAL HIGH (ref 0.61–1.24)
GFR, Estimated: 17 mL/min — ABNORMAL LOW (ref >60)
Glucose, Bld: 95 mg/dL (ref 70–99)
Potassium: 4.7 mmol/L (ref 3.5–5.1)
Sodium: 138 mmol/L (ref 135–145)
Total Bilirubin: 0.8 mg/dL (ref 0.3–1.2)
Total Protein: 7.2 g/dL (ref 6.5–8.1)

## 2021-10-27 LAB — CBC WITH DIFFERENTIAL/PLATELET
Abs Immature Granulocytes: 0.01 10*3/uL (ref 0.00–0.07)
Basophils Absolute: 0 10*3/uL (ref 0.0–0.1)
Basophils Relative: 0 %
Eosinophils Absolute: 0 10*3/uL (ref 0.0–0.5)
Eosinophils Relative: 1 %
HCT: 26.2 % — ABNORMAL LOW (ref 39.0–52.0)
Hemoglobin: 8.6 g/dL — ABNORMAL LOW (ref 13.0–17.0)
Immature Granulocytes: 0 %
Lymphocytes Relative: 16 %
Lymphs Abs: 0.8 10*3/uL (ref 0.7–4.0)
MCH: 34 pg (ref 26.0–34.0)
MCHC: 32.8 g/dL (ref 30.0–36.0)
MCV: 103.6 fL — ABNORMAL HIGH (ref 80.0–100.0)
Monocytes Absolute: 0.9 10*3/uL (ref 0.1–1.0)
Monocytes Relative: 18 %
Neutro Abs: 3.1 10*3/uL (ref 1.7–7.7)
Neutrophils Relative %: 65 %
Platelets: 176 10*3/uL (ref 150–400)
RBC: 2.53 MIL/uL — ABNORMAL LOW (ref 4.22–5.81)
RDW: 22.5 % — ABNORMAL HIGH (ref 11.5–15.5)
WBC: 4.8 10*3/uL (ref 4.0–10.5)
nRBC: 0 % (ref 0.0–0.2)

## 2021-10-27 LAB — TSH: TSH: 2.397 u[IU]/mL (ref 0.350–4.500)

## 2021-10-27 LAB — MAGNESIUM: Magnesium: 1.9 mg/dL (ref 1.7–2.4)

## 2021-10-27 MED ORDER — PREDNISONE 5 MG PO TABS: 5.0000 mg | ORAL_TABLET | Freq: Every day | ORAL | 6 refills | Status: DC

## 2021-10-27 MED ORDER — SODIUM CHLORIDE 0.9% FLUSH
10.0000 mL | INTRAVENOUS | Status: DC | PRN
  Administered 2021-10-27: 10 mL via INTRAVENOUS

## 2021-10-27 MED ORDER — HEPARIN SOD (PORK) LOCK FLUSH 100 UNIT/ML IV SOLN
500.0000 [IU] | Freq: Once | INTRAVENOUS | Status: AC
  Administered 2021-10-27: 500 [IU] via INTRAVENOUS

## 2021-10-27 MED ORDER — SODIUM CHLORIDE 0.9 % IV SOLN: Freq: Once | INTRAVENOUS | Status: AC

## 2021-10-27 MED ORDER — DEGARELIX ACETATE(240 MG DOSE) 120 MG/VIAL ~~LOC~~ SOLR
240.0000 mg | Freq: Once | SUBCUTANEOUS | Status: AC
  Administered 2021-10-27: 240 mg via SUBCUTANEOUS
  Filled 2021-10-27: qty 6

## 2021-10-27 MED ORDER — ABIRATERONE ACETATE 250 MG PO TABS
1000.0000 mg | ORAL_TABLET | Freq: Every day | ORAL | 3 refills | Status: DC
  Filled 2021-10-27 – 2021-11-02 (×2): qty 120, 30d supply, fill #0
  Filled 2021-12-02: qty 120, 30d supply, fill #1
  Filled 2022-01-03: qty 120, 30d supply, fill #2
  Filled 2022-01-25: qty 120, 30d supply, fill #3

## 2021-10-27 NOTE — Progress Notes (Signed)
Alexander [name], [place] [address]   CLINIC:  Medical Oncology/Hematology  PCP:  Lemmie Evens, MD [place]. 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   07/28/2014 Initial Diagnosis   Oropharyngeal cancer   08/03/2014 Imaging   CT CAP- L supraclavicular lymphadenopathy is not completely visualized. This is better seen on the previous neck CT from 07/27/2014. Otherwise, no evidence for metastatic disease in the chest, abdomen, or pelvis.   08/03/2014 Imaging   Bone scan- Uptake at adjacent anterior LEFT 6, 7, 8 ribs likely representing trauma/fractures. Questionable nonspecific increased tracer localization at the posterior RIGHT 8th and 9th ribs, the adjacent nature which raises a a question of trauma as well   08/06/2014 Pathology Results   Dr. Benjamine Mola- Oropharynx, biopsy, Left - INVASIVE SQUAMOUS CELL CARCINOMA.   08/12/2014 Procedure   Dr. Enrique Sack- 1. Multiple extraction of tooth numbers 6, 17, 22, 23, 24, 25, 26, and 27. 3 Quadrants of alveoloplasty   08/17/2014 Pathology Results   PORT and G-TUBE placed by Dr. Carlis Stable.   08/26/2014 PET scan   Large hypermetabolic mass in the left base of tongue. Activity extends across midline to the right base tongue. 2. Intensely hypermetabolic left cervical metastatic lymph nodes. Lymph nodes extend from the left level II position to the left supraclavi   09/01/2014 - 09/22/2014 Chemotherapy   Concurrent chemoradiation with Cisplatin 100 mg/m2 x 2 cycles with Neulasta  support. Held cycle #3 d/t renal toxicity.    09/03/2014 - 10/23/2014 Radiation Therapy   Treated in [place], IMRT Isidore Moos).  Base of tongue and bilat neck. Total dose: 70 Gy in 35 fractions. (of note, he did miss several treatments requiring BID dosing towards the end of treatment).    01/25/2015 PET scan   Near complete resolution of metabolic activity at the base of tongue. Minimal residual activity is likely post treatment effect. 2. Complete resolution of metabolic activity above LEFT cervical lymph nodes. No evidence of residual metabolically active    2/94/7654 Procedure   Port-a-cath removed [name])    05/03/2018 - 08/23/2018 Chemotherapy   The patient had dexamethasone (DECADRON) 4 MG tablet, 8 mg, Oral, Daily, 1 of 1 cycle, Start date: 05/01/2018, End date: 10/23/2018 palonosetron (ALOXI) injection 0.25 mg, 0.25 mg, Intravenous,  Once, 6 of 6 cycles Administration: 0.25 mg (05/03/2018), 0.25 mg (05/24/2018), 0.25 mg (06/14/2018), 0.25 mg (07/09/2018), 0.25 mg (07/30/2018), 0.25 mg (08/21/2018) pegfilgrastim-cbqv (UDENYCA) injection 6 mg, 6 mg, Subcutaneous, Once, 5 of 5 cycles Administration: 6 mg (05/27/2018), 6 mg (06/17/2018), 6 mg (07/11/2018), 6 mg (08/01/2018), 6 mg (08/23/2018) CARBOplatin (PARAPLATIN) 380 mg in sodium chloride 0.9 % 250 mL chemo infusion, 380 mg (100 % of original dose 381 mg), Intravenous,  Once, 6 of 6 cycles Dose modification:   (original dose 381 mg, Cycle 1),   (original dose 309.5 mg, Cycle 2), 307.5 mg (original dose 309.5 mg, Cycle 5) Administration: 380 mg (05/03/2018), 310 mg (05/24/2018), 310 mg (06/14/2018), 340 mg (07/09/2018), 310 mg (07/30/2018), 350 mg (08/21/2018) PACLitaxel (TAXOL) 330 mg in sodium chloride 0.9 %  500 mL chemo infusion (> 34m/m2), 175 mg/m2 = 330 mg (100 % of original dose 175 mg/m2), Intravenous,  Once, 6 of 6 cycles Dose modification: 175 mg/m2 (original dose 175 mg/m2, Cycle 1, Reason: Patient Age) Administration: 330 mg (05/03/2018), 330 mg (05/24/2018),  330 mg (06/14/2018), 330 mg (07/09/2018), 330 mg (07/30/2018), 330 mg (08/21/2018)  for chemotherapy treatment.    05/24/2018 - 05/06/2021 Chemotherapy   Patient is on Treatment Plan : HEAD/NECK Pembrolizumab/Taxol/Carboplatin Q21D     07/05/2021 -  Chemotherapy   Patient is on Treatment Plan : LUNG Gemcitabine D1,8 q21d     Squamous cell lung cancer, left ([facility]  06/14/2018 Initial Diagnosis   Squamous cell lung cancer, left ([facility])     CANCER STAGING:  Cancer Staging  Oropharyngeal carcinoma ([facility]CC) Staging form: Pharynx - Oropharynx, AJCC 7th Edition - Clinical: Stage IVA (T4a, N2b, M0) - Unsigned   INTERVAL HISTORY:  Alexander [name] a 71y.o. male, returns for routine follow-up and consideration for next cycle of chemotherapy. CAaranwas last seen on 10/06/2021.  Due for cycle #6 of Gemcitabine today.   Overall, he tells me he has been feeling pretty well. He denies any worsening pain. He is tolerating treatment well. He denies having any nausea, fatigue, weight loss, or change in appetite. He has no recent changes to his medication. He denies difficulty urinating.   Overall, he will not receive his next cycle of chemo today.    REVIEW OF SYSTEMS:  Review of Systems  Constitutional:  Negative for appetite change, fatigue and unexpected weight change.  Respiratory:  Positive for cough.   Gastrointestinal:  Negative for nausea.  Genitourinary:  Negative for difficulty urinating.   Musculoskeletal:  Positive for arthralgias (7/10 bilateral knee pain).  All other systems reviewed and are negative.   PAST MEDICAL/SURGICAL HISTORY:  Past Medical History:  Diagnosis Date   GERD (gastroesophageal reflux disease)    Mass of neck    dx. oropharyngeal squamous cell carcinoma- Chemo. radiation planned   [place] 07/28/2014   [place]. 3 [place] ago[facility] [place] [place]   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[facility] 08/06/2014   SCCa of Left BOT    Past Surgical History:  Procedure Laterality Date   BIOPSY  01/15/2018   Procedure: BIOPSY;  Surgeon: FDanie Binder MD;  [facility];  Service: Endoscopy;;  gastric   COLONOSCOPY N/A 03/13/2016   Procedure: COLONOSCOPY;  Surgeon: SDanie Binder MD;  [facility];  Service: Endoscopy;  Laterality: N/A;  2:15 PM   CYSTOSCOPY N/A 08/29/2021   Procedure: CYSTOSCOPY;  Surgeon: MCleon Gustin MD;  Location: [facility];  Service: Urology;  Laterality: N/A;   CYSTOSCOPY WITH INSERTION OF UROLIFT N/A 06/27/2021   Procedure: CYSTOSCOPY WITH INSERTION OF UROLIFT;  Surgeon: MCleon Gustin MD;  Location: [facility];  Service: Urology;  Laterality: N/A;   ESOPHAGOGASTRODUODENOSCOPY (EGD) WITH PROPOFOL N/A 08/17/2014   Procedure: ESOPHAGOGASTRODUODENOSCOPY (EGD) WITH PROPOFOL (procedure #1);  Surgeon: MAviva SignsMd, MD;  Location: [facility];  Service: General;  Laterality: N/A;   ESOPHAGOGASTRODUODENOSCOPY (EGD) WITH PROPOFOL N/A 01/15/2018   Procedure: ESOPHAGOGASTRODUODENOSCOPY (EGD) WITH PROPOFOL;  Surgeon: FDanie Binder MD;  [facility];  Service: Endoscopy;  Laterality: N/A;  9:30am   MULTIPLE EXTRACTIONS WITH ALVEOLOPLASTY N/A 08/12/2014   Procedure: Extraction of tooth #'s 6,17,22,23,24,25,26,27 with alveoloplasty;  Surgeon: RLenn Cal DDS;  Location: [facility];  Service: Oral Surgery;  Laterality: N/A;   PANENDOSCOPY N/A 08/06/2014   Procedure: PANENDOSCOPY WITH BIOPSY;  Surgeon: Leta Baptist, MD;  Location: [facility];  Service: ENT;  Laterality: N/A;   PEG PLACEMENT Left 08/17/14   PEG PLACEMENT N/A 08/17/2014   Procedure: PERCUTANEOUS ENDOSCOPIC GASTROSTOMY (PEG) PLACEMENT (procedure #1);  Surgeon: Aviva Signs Md, MD;  Location: [facility];  Service: General;  Laterality: N/A;   PORT-A-CATH REMOVAL Right 07/17/2016   Procedure: MINOR REMOVAL PORT-A-CATH;  Surgeon: Aviva Signs, MD;  Location: [facility];  Service: General;  Laterality: Right;    PORTACATH PLACEMENT Right 08/17/14   PORTACATH PLACEMENT Right 08/17/2014   Procedure: INSERTION PORT-A-CATH (procedure #2);  Surgeon: Aviva Signs Md, MD;  Location[facility];  Service: General;  Laterality: Right;   PORTACATH PLACEMENT Left 04/26/2018   Procedure: INSERTION PORT-A-CATH (attached catheter in left subclavian);  Surgeon: Aviva Signs, MD;  Location[facility];  Service: General;  Laterality: Left;   SAVORY DILATION N/A 01/15/2018   Procedure: SAVORY DILATION;  Surgeon: Danie Binder, MD;  [facility];  Service: Endoscopy;  Laterality: N/A;   TRANSURETHRAL RESECTION OF PROSTATE N/A 08/29/2021   Procedure: TRANSURETHRAL RESECTION OF THE PROSTATE (TURP);  Surgeon: Cleon Gustin, MD;  Location: [facility];  Service: Urology;  Laterality: N/A;   VIDEO BRONCHOSCOPY WITH ENDOBRONCHIAL ULTRASOUND N/A 04/15/2018   Procedure: VIDEO BRONCHOSCOPY WITH ENDOBRONCHIAL ULTRASOUND;  Surgeon: Melrose Nakayama, MD;  Location: [facility];  Service: Thoracic;  Laterality: N/A;    SOCIAL HISTORY:  Social History   Socioeconomic History   Marital status: Legally Separated    Spouse name: Not on file   Number of children: 5   Years of education: Not on file   Highest education level: Not on file  Occupational History   Not on file  Tobacco Use   Smoking status: Former    Packs/day: 0.50    Years: 30.00    Total pack years: 15.00    Types: Cigarettes    Quit date: 07/22/2014    Years since quitting: 7.2   Smokeless tobacco: Never  Vaping Use   Vaping Use: Never used  Substance and Sexual Activity   Alcohol use: Not Currently    Alcohol/week: 0.0 standard drinks of alcohol    Comment: None currently (11/09/17); previously 1-2 beers on the weekend   Drug use: No   Sexual activity: Yes  Other Topics Concern   Not on file  Social History Narrative   Not on file   Social Determinants of Health   Financial Resource Strain: Low Risk  (02/12/2020)   Overall Financial Resource  Strain (CARDIA)    Difficulty of Paying Living Expenses: Not hard at all  Food Insecurity: No Food Insecurity (02/12/2020)   Hunger Vital Sign    Worried About Running Out of Food in the Last Year: Never true    Ran Out of Food in the Last Year: Never true  Transportation Needs: No Transportation Needs (02/12/2020)   PRAPARE - Hydrologist (Medical): No    Lack of Transportation (Non-Medical): No  Physical Activity: Inactive (02/12/2020)   Exercise Vital Sign    Days of Exercise per Week: 0 days    Minutes of Exercise per Session: 0 min  Stress: No Stress Concern Present (02/12/2020)   [place]    Feeling of Stress : Not at all  Social Connections: Moderately Isolated (02/12/2020)   Social  Connection and Isolation Panel [NHANES]    Frequency of Communication with Friends and Family: More than three times a week    Frequency of Social Gatherings with Friends and Family: Twice a week    Attends Religious Services: Never    Marine scientist or Organizations: No    Attends Archivist Meetings: Never    Marital Status: Married  Human resources officer Violence: Not At Risk (02/12/2020)   Humiliation, Afraid, Rape, and Kick questionnaire    Fear of Current or Ex-Partner: No    Emotionally Abused: No    Physically Abused: No    Sexually Abused: No    FAMILY HISTORY:  Family History  Problem Relation Age of Onset   Colon cancer Neg Hx    Gastric cancer Neg Hx    Esophageal cancer Neg Hx     CURRENT MEDICATIONS:  Current Outpatient Medications  Medication Sig Dispense Refill   Gemcitabine HCl (GEMZAR IV) Inject into the vein once a week. Days 1 & 8 q 21 days     HYDROcodone-acetaminophen (NORCO) 10-325 MG tablet TAKE 1 TABLET EVERY 12 HOURS AS NEEDED. 60 tablet 0   levothyroxine (SYNTHROID) 88 MCG tablet TAKE ONE TABLET BY MOUTH DAILY BEFORE BREAKFAST 30 tablet 2   naproxen  sodium (ALEVE) 220 MG tablet Take 220 mg by mouth daily as needed (pain.).     omeprazole (PRILOSEC) 20 MG capsule Take 1 capsule (20 mg total) by mouth daily. 30 capsule 3   sildenafil (VIAGRA) 100 MG tablet Take 1 tablet (100 mg total) by mouth as needed for erectile dysfunction. 10 tablet 6   tadalafil (CIALIS) 20 MG tablet Take 1 tablet (20 mg total) by mouth daily as needed. 10 tablet 5   tamsulosin (FLOMAX) 0.4 MG CAPS capsule Take 1 capsule (0.4 mg total) by mouth 2 (two) times daily. 60 capsule 11   No current facility-administered medications for this visit.    ALLERGIES:  No Known Allergies  PHYSICAL EXAM:  Performance status (ECOG): 1 - Symptomatic but completely ambulatory  There were no vitals filed for this visit. Wt Readings from Last 3 Encounters:  10/14/21 128 lb 9.6 oz (58.3 kg)  10/06/21 129 lb 6.4 oz (58.7 kg)  09/21/21 135 lb (61.2 kg)   Physical Exam Vitals reviewed.  Constitutional:      Appearance: Normal appearance.  Cardiovascular:     Rate and Rhythm: Normal rate and regular rhythm.     Pulses: Normal pulses.     Heart sounds: Normal heart sounds.  Pulmonary:     Effort: Pulmonary effort is normal.     Breath sounds: Normal breath sounds.  Neurological:     General: No focal deficit present.     Mental Status: He is alert and oriented to person, place, and time.  Psychiatric:        Mood and Affect: Mood normal.        Behavior: Behavior normal.     LABORATORY DATA:  I have reviewed the labs as listed.     Latest Ref Rng & Units 10/14/2021   10:09 AM 10/03/2021   10:54 AM 09/22/2021   12:15 PM  CBC  WBC 4.0 - 10.5 K/uL 3.4  4.5  1.8   Hemoglobin 13.0 - 17.0 g/dL 8.2  8.1  8.6   Hematocrit 39.0 - 52.0 % 25.3  24.6  27.3   Platelets 150 - 400 K/uL 343  120  396  Latest Ref Rng & Units 10/14/2021   10:09 AM 10/03/2021   10:54 AM 09/22/2021   12:15 PM  CMP  Glucose 70 - 99 mg/dL 98  98  94   BUN 8 - 23 mg/dL 37  46  31   Creatinine  0.61 - 1.24 mg/dL 2.55  2.47  2.29   Sodium 135 - 145 mmol/L 140  138  142   Potassium 3.5 - 5.1 mmol/L 4.9  4.6  4.3   Chloride 98 - 111 mmol/L 110  108  110   CO2 22 - 32 mmol/L 24  23  24    Calcium 8.9 - 10.3 mg/dL 8.6  8.8  8.9   Total Protein 6.5 - 8.1 g/dL 6.8  7.1  6.9   Total Bilirubin 0.3 - 1.2 mg/dL 0.3  0.4  0.6   Alkaline Phos 38 - 126 U/L 61  57  53   AST 15 - 41 U/L 23  18  17    ALT 0 - 44 U/L 25  20  17      DIAGNOSTIC IMAGING:  I have independently reviewed the scans and discussed with the patient. NM PET (PSMA) SKULL TO MID THIGH  Result Date: 10/17/2021 CLINICAL DATA:  Subsequent treatment strategy for prostate carcinoma. Additional history of head neck carcinoma. EXAM: NUCLEAR MEDICINE PET SKULL BASE TO THIGH TECHNIQUE: 8.3 mCi F18 Piflufolastat (Pylarify) was injected intravenously. Full-ring PET imaging was performed from the skull base to thigh after the radiotracer. CT data was obtained and used for attenuation correction and anatomic localization. COMPARISON:  PSMA PET scan 10/04/2020 FINDINGS: NECK No radiotracer activity in neck lymph nodes. Incidental CT finding: None CHEST New radiotracer avid RIGHT hilar nodes. For example small node on image 127 with SUV max equal 10.7. New radiotracer avid RIGHT paratracheal node with SUV max equal 8.4. No supraclavicular adenopathy. Rounded lesion in the LEFT lower lobe unchanged in size measuring 2.3 cm with no significant radiotracer activity. Incidental CT finding: None ABDOMEN/PELVIS Prostate: Intense activity again noted within the prostate gland with SUV max equal 31 compared SUV max equal 54. Activity involves the LEFT and RIGHT lobe of the prostate gland. Extension into the Seminal vesicles region greater on the RIGHT. Lymph nodes: Radiotracer avid operator nodes again identified. RIGHT operator node with SUV max equal 15 compared SUV max equal 22. Increased nodal activity in the RIGHT common iliac region adjacent the psoas  with SUV max equal 40.0. Liver: No evidence of liver metastasis Incidental CT finding: Increased hydronephrosis of the RIGHT kidney and increased hydroureter on the RIGHT. Hydroureter extends into the deep RIGHT pelvis. SKELETON Multiple new radiotracer avid lesions in the skeleton. For example sclerotic lesion in the RIGHT iliac wing measuring 9 mm with SUV max equal 8.3 on image 231. New lesion in the LEFT pedicle of the T11 vertebral body SUV max equal 14 (image 155. Broad lesion involving posterolateral LEFT eighth rib with SUV max equal 6.1. Several additional rib lesions are present LEFT and RIGHT with associated sclerosis. IMPRESSION: 1. New multifocal skeletal metastasis with intense radiotracer avid lesions in the pelvis, spine, and bilateral ribs. Findings consistent metastatic prostate carcinoma. 2. New radiotracer avid mediastinal and hilar lymph nodes. Findings consistent metastatic prostate cancer adenopathy 3. Increased RIGHT hydronephrosis presumed related to local extension of prostate carcinoma at the level of the RIGHT distal ureter/vesicoureteral junction. 4. Intense radiotracer avid tumor within the prostate gland. 5. Persistent radiotracer avid pelvic lymph nodes. Electronically Signed   By:  Suzy Bouchard M.D.   On: 10/17/2021 14:30   CT CHEST ABDOMEN PELVIS WO CONTRAST  Result Date: 10/04/2021 CLINICAL DATA:  Squamous cell carcinoma of the left lung, prior chemo radiation. Renal insufficiency. * Tracking Code: BO * EXAM: CT CHEST, ABDOMEN AND PELVIS WITHOUT CONTRAST TECHNIQUE: Multidetector CT imaging of the chest, abdomen and pelvis was performed following the standard protocol without IV contrast. RADIATION DOSE REDUCTION: This exam was performed according to the departmental dose-optimization program which includes automated exposure control, adjustment of the mA and/or kV according to patient size and/or use of iterative reconstruction technique. COMPARISON:  Multiple exams,  including 06/02/2021 FINDINGS: CT CHEST FINDINGS Cardiovascular: Left Port-A-Cath tip: SVC. Coronary, aortic arch, and branch vessel atherosclerotic vascular disease. Heart size within normal limits. Mediastinum/Nodes: Ill definition of mediastinal fat planes due to severe cachexia. Suspected thoracic adenopathy, right paratracheal adenopathy measuring 2.1 cm in short axis on image 23 series 2, previously 1.9 cm. Indistinctly marginated AP window adenopathy noted. As before there is irregularity of the carina with subcarinal adenopathy, estimated at 2.2 cm in short axis on image 34 series 2, stable. Lungs/Pleura: Biapical pleuroparenchymal scarring. Severe emphysema. Numerous scattered small nodules in the lungs are again observed, most below 5 mm in diameter. This pattern of scattered nodularity is similar to prior. The left lower lobe nodule measures 2.3 by 1.7 cm on image 111 series 3, formerly measured at 1.7 by 1.2 cm. Irregularity along the carina and proximal right and left bronchi on image 71 series 5, similar to previous. Musculoskeletal: Sclerosis in the left eighth rib compatible with treated metastatic lesion. Sclerotic 1.9 by 1.0 cm lesion in the posterior left T11 vertebral body on image 94 series 6 compatible with metastatic lesion. CT ABDOMEN PELVIS FINDINGS Hepatobiliary: Scattered hypodense hepatic lesions are probably cysts. Faint dependent density in the gallbladder suspicious for small gallstones. Pancreas: Indistinctly marginated, no gross abnormality observed. Spleen: Unremarkable Adrenals/Urinary Tract: Indistinct adrenal glands, no obvious adrenal mass. Moderate to prominent right hydronephrosis and right hydroureter extending down into the pelvis, with distal right ureter indistinct. No urinary tract calculi identified. Stomach/Bowel: No dilated bowel.  Formed stool in the distal colon. Vascular/Lymphatic: Atherosclerosis is present, including aortoiliac atherosclerotic disease.  Indistinctly marginated retroperitoneal adenopathy. Combined left lower periaortic adenopathy about 2.4 cm in thickness on image 77 series 2, previously 2.2 cm. Suspected increase in the aortocaval adenopathy on image 72 series 2. Reproductive: Indistinct margins of the prostate gland. Prostate tumor not excluded, correlate with PSA levels. Probable TURP in the past. Other: Marked paucity of fat planes in the abdomen due to cachexia. Musculoskeletal: Newly appreciable 1.0 cm sclerotic lesion in the right iliac crest on image 93 series 2 compatible with osseous metastatic lesion. Degenerative endplate findings at G8-1 and L5-S1. Increased conspicuity of a 5 mm sclerotic lesion in the L3 vertebral body on image 77 of series 6, compatible with a small metastatic lesion. IMPRESSION: 1. Multifocal sclerotic osseous metastatic disease including the left eighth rib, left T11 vertebral body, right iliac crest, and in the L3 vertebral body. 2. Enlarging left lower lobe pulmonary nodule. Suspected mildly increased adenopathy in the chest and abdomen although measurements are difficult due to cachexia causing ill definition of margins of the lymph nodes. 3. Continued irregularity of the carina, cannot exclude invasion by underlying subcarinal adenopathy. 4. Moderate to prominent right hydronephrosis and right hydroureter extending down into the pelvis. Cause uncertain but presumably due to distal obstruction. There is some indistinctness of the margins of  the prostate gland, strictly speaking prostate tumor cannot be excluded. 5. Other imaging findings of potential clinical significance: Aortic Atherosclerosis (ICD10-I70.0). Coronary atherosclerosis. Systemic atherosclerosis. Emphysema (ICD10-J43.9). Scattered subside mm size nodules throughout the lungs, unchanged. Electronically Signed   By: Alexander [name] M.D.   On: 10/04/2021 17:07     ASSESSMENT:  1.  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 -6 cycles of carboplatin, paclitaxel and pembrolizumab from 05/03/2018 through 08/21/2018. -Maintenance pembrolizumab started on 09/11/2018. -PET scan on 09/15/2019 showed interval decrease in hypermetabolic areas associated with tongue and floor of the mouth.  Hypermetabolic metastatic lymphadenopathy in the chest is stable.  No new sites seen. -PET scan on 03/15/2020 shows persistent, stable hypermetabolism in the tongue/floor of mouth.  Slight interval decrease in hypermetabolism with mediastinal/hilar adenopathy.  Persistent hypermetabolic focus in the right supraclavicular region.  New focus of hypermetabolic them identified in the right external iliac chain of pelvis with no discernible adenopathy on the CT. -CT CAP on 11/24/2020 showed left lower lobe lung nodule measuring 1.3 x 1.1 cm, previously 1.0 x 0.9 cm on CT scan from June.  Lesion was negative on PSMA PET scan.  Stable mediastinal lymph nodes. 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 - 6 cycles of carboplatin, paclitaxel and pembrolizumab from 01/12/2021 through 05/04/2021 with progression. - NGS test: No targetable mutations.  PD-L1 TPS is negative.  MSI-stable.  TMB-low. - Gemcitabine 2 weeks on/1 week off started on 07/05/2021.   2.  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  3.  Prostate cancer: - Evaluated by Dr. Alyson Ingles and biopsy was recommended.  Patient declined.  4.  Left kidney mass: - Biopsy of the left kidney mass was consistent with metastatic squamous cell carcinoma.   PLAN:  1.  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  Slightly enlarged left lower  lobe pulmonary nodule and mildly increased adenopathy in the chest and abdomen.  Measurements are difficult due to cachexia causing ill-definition of margin of lymph nodes. - Putting together with PSMA PET scan, I doubt if there is any significant progression of his lung cancer.  His creatinine is 3.61 today.  I will hold off on continuation of gemcitabine at least temporarily.  We will concentrate on treatment for prostate cancer. - Today he will receive 1 L of normal saline for his elevated creatinine.   2.  Hypothyroidism: - Continue Synthroid 88 mcg daily.  Today TSH is 2.3.   3.  Bilateral knee pains: - Continue hydrocodone twice daily as needed.   4.  Nutrition: - He is drinking 2 ensures per day and eating well.  Weight is stable from last visit.   5.  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 - Last received Venofer x1 month ago.  Hemoglobin today is 8.6.  Closely monitor.  6.  Prostate cancer Gleason 4+5=9: - Last PSA was 99.7 on 10/03/2021. - We reviewed PSMA PET scan (10/13/2021): New multifocal bone mets in the pelvis, spine, bilateral ribs.  New mediastinal and hilar lymph nodes.  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  Intense  radiotracer avid tumor within the prostate gland.  Persistent radiotracer avid pelvic lymph nodes. - I think his prostate cancer is progressing at a faster rate than his lung cancer.  Hence I have recommended starting treatment for prostate cancer.  We will start him on degarelix injection today. - We talked about Abiraterone 1000 mg daily on an empty stomach along with prednisone 5 mg daily. - We discussed side effects including fatigue, hypokalemia, hypertension among others. - I plan to see him back 2 weeks after starting Abiraterone.   Orders placed this encounter:  No orders of the defined types were placed in this  encounter.    Alexander Jack, MD [place] [phone]   I, Thana Ates, am acting as a scribe for Dr. Derek [name].  I, Alexander Jack MD, have reviewed the above documentation for accuracy and completeness, and I agree with the above.

## 2021-10-27 NOTE — Patient Instructions (Signed)
Erectile Dysfunction ?Erectile dysfunction (ED) is the inability to get or keep an erection in order to have sexual intercourse. ED is considered a symptom of an underlying disorder and is not considered a disease. ED may include: ?Inability to get an erection. ?Lack of enough hardness of the erection to allow penetration. ?Loss of erection before sex is finished. ?What are the causes? ?This condition may be caused by: ?Physical causes, such as: ?Artery problems. This may include heart disease, high blood pressure, atherosclerosis, and diabetes. ?Hormonal problems, such as low testosterone. ?Obesity. ?Nerve problems. This may include back or pelvic injuries, multiple sclerosis, Parkinson's disease, spinal cord injury, and stroke. ?Certain medicines, such as: ?Pain relievers. ?Antidepressants. ?Blood pressure medicines and water pills (diuretics). ?Cancer medicines. ?Antihistamines. ?Muscle relaxants. ?Lifestyle factors, such as: ?Use of drugs such as marijuana, cocaine, or opioids. ?Excessive use of alcohol. ?Smoking. ?Lack of physical activity or exercise. ?Psychological causes, such as: ?Anxiety or stress. ?Sadness or depression. ?Exhaustion. ?Fear about sexual performance. ?Guilt. ?What are the signs or symptoms? ?Symptoms of this condition include: ?Inability to get an erection. ?Lack of enough hardness of the erection to allow penetration. ?Loss of the erection before sex is finished. ?Sometimes having normal erections, but with frequent unsatisfactory episodes. ?Low sexual satisfaction in either partner due to erection problems. ?A curved penis occurring with erection. The curve may cause pain, or the penis may be too curved to allow for intercourse. ?Never having nighttime or morning erections. ?How is this diagnosed? ?This condition is often diagnosed by: ?Performing a physical exam to find other diseases or specific problems with the penis. ?Asking you detailed questions about the problem. ?Doing tests,  such as: ?Blood tests to check for diabetes mellitus or high cholesterol, or to measure hormone levels. ?Other tests to check for underlying health conditions. ?An ultrasound exam to check for scarring. ?A test to check blood flow to the penis. ?Doing a sleep study at home to measure nighttime erections. ?How is this treated? ?This condition may be treated by: ?Medicines, such as: ?Medicine taken by mouth to help you achieve an erection (oral medicine). ?Hormone replacement therapy to replace low testosterone levels. ?Medicine that is injected into the penis. Your health care provider may instruct you how to give yourself these injections at home. ?Medicine that is delivered with a short applicator tube. The tube is inserted into the opening at the tip of the penis, which is the opening of the urethra. A tiny pellet of medicine is put in the urethra. The pellet dissolves and enhances erectile function. This is also called MUSE (medicated urethral system for erections) therapy. ?Vacuum pump. This is a pump with a ring on it. The pump and ring are placed on the penis and used to create pressure that helps the penis become erect. ?Penile implant surgery. In this procedure, you may receive: ?An inflatable implant. This consists of cylinders, a pump, and a reservoir. The cylinders can be inflated with a fluid that helps to create an erection, and they can be deflated after intercourse. ?A semi-rigid implant. This consists of two silicone rubber rods. The rods provide some rigidity. They are also flexible, so the penis can both curve downward in its normal position and become straight for sexual intercourse. ?Blood vessel surgery to improve blood flow to the penis. During this procedure, a blood vessel from a different part of the body is placed into the penis to allow blood to flow around (bypass) damaged or blocked blood vessels. ?Lifestyle changes,   such as exercising more, losing weight, and quitting smoking. ?Follow  these instructions at home: ?Medicines ? ?Take over-the-counter and prescription medicines only as told by your health care provider. Do not increase the dosage without first discussing it with your health care provider. ?If you are using self-injections, do injections as directed by your health care provider. Make sure you avoid any veins that are on the surface of the penis. After giving an injection, apply pressure to the injection site for 5 minutes. ?Talk to your health care provider about how to prevent headaches while taking ED medicines. These medicines may cause a sudden headache due to the increase in blood flow in your body. ?General instructions ?Exercise regularly, as directed by your health care provider. Work with your health care provider to lose weight, if needed. ?Do not use any products that contain nicotine or tobacco. These products include cigarettes, chewing tobacco, and vaping devices, such as e-cigarettes. If you need help quitting, ask your health care provider. ?Before using a vacuum pump, read the instructions that come with the pump and discuss any questions with your health care provider. ?Keep all follow-up visits. This is important. ?Contact a health care provider if: ?You feel nauseous. ?You are vomiting. ?You get sudden headaches while taking ED medicines. ?You have any concerns about your sexual health. ?Get help right away if: ?You are taking oral or injectable medicines and you have an erection that lasts longer than 4 hours. If your health care provider is unavailable, go to the nearest emergency room for evaluation. An erection that lasts much longer than 4 hours can result in permanent damage to your penis. ?You have severe pain in your groin or abdomen. ?You develop redness or severe swelling of your penis. ?You have redness spreading at your groin or lower abdomen. ?You are unable to urinate. ?You experience chest pain or a rapid heartbeat (palpitations) after taking oral  medicines. ?These symptoms may represent a serious problem that is an emergency. Do not wait to see if the symptoms will go away. Get medical help right away. Call your local emergency services (911 in the [place] Do not drive yourself to the hospital. ?Summary ?Erectile dysfunction (ED) is the inability to get or keep an erection during sexual intercourse. ?This condition is diagnosed based on a physical exam, your symptoms, and tests to determine the cause. Treatment varies depending on the cause and may include medicines, hormone therapy, surgery, or a vacuum pump. ?You may need follow-up visits to make sure that you are using your medicines or devices correctly. ?Get help right away if you are taking or injecting medicines and you have an erection that lasts longer than 4 hours. ?This information is not intended to replace advice given to you by your health care provider. Make sure you discuss any questions you have with your health care provider. ?Document Revised: 06/09/2020 Document Reviewed: 06/09/2020 ?Elsevier Patient Education ? 2023 Elsevier Inc. ? ?

## 2021-10-27 NOTE — Telephone Encounter (Signed)
Oral Oncology Patient Advocate Encounter  Prior Authorization for Abiraterone has been approved.    PA#  [id number] Effective dates: 10/27/2021 through 03/26/2022  Patients co-pay is $0.    Lady Deutscher, CPhT-Adv Pharmacy Patient [place] Patient Advocate Team Direct Number: [phone]  Fax: [phone]

## 2021-10-27 NOTE — Progress Notes (Signed)
Patient presents today for IVF.  Patient is in satisfactory condition with no new complaints voiced.  Vital signs are stable.  We will proceed with fluids per MD orders.   Patient tolerated Firmagon injection with no complaints voiced.  Site clean and dry with no bruising or swelling noted.  No complaints of pain.  Discharged with vital signs stable and no signs or symptoms of distress noted.

## 2021-10-27 NOTE — Patient Instructions (Signed)
[facility]  Discharge Instructions: Thank you for choosing [facility] to provide your oncology and hematology care.  If you have a lab appointment with the [facility], please come in thru the Main Entrance and check in at the main information desk.  Wear comfortable clothing and clothing appropriate for easy access to any Portacath or PICC line.   We strive to give you quality time with your provider. You may need to reschedule your appointment if you arrive late (15 or more minutes).  Arriving late affects you and other patients whose appointments are after yours.  Also, if you miss three or more appointments without notifying the office, you may be dismissed from the clinic at the provider's discretion.      For prescription refill requests, have your pharmacy contact our office and allow 72 hours for refills to be completed.    Today you received the following chemotherapy and/or immunotherapy agents Firmagon.  Degarelix Injection What is this medication? DEGARELIX (deg a REL ix) treats prostate cancer. It works by decreasing levels of the hormone testosterone in the body. This prevents prostate cancer cells from spreading or growing. It belongs to a group of medications called GnRH blockers. This medicine may be used for other purposes; ask your health care provider or pharmacist if you have questions. COMMON BRAND NAME(S): Degarelix, Mills Koller What should I tell my care team before I take this medication? They need to know if you have any of these conditions: Diabetes Heart disease Kidney disease Liver disease Low levels of potassium or magnesium in the blood Osteoporosis, weak bones An unusual or allergic reaction to degarelix, mannitol, other medications, foods, dyes, or preservatives If you or your partner are pregnant or trying to get pregnant How should I use this medication? This medication is injected under the skin. It is usually given  by your care team in a hospital or clinic setting. It may also be given at home. If you get this medication at home, you will be taught how to prepare and give it. Use exactly as directed. Take it as directed on the prescription label at the same time every day. Keep taking it unless your care team tells you to stop. It is important that you put your used needles and syringes in a special sharps container. Do not put them in a trash can. If you do not have a sharps container, call your pharmacist or care team to get one. Talk to your care team about the use of this medication in children. Special care may be needed. Overdosage: If you think you have taken too much of this medicine contact a poison control center or emergency room at once. NOTE: This medicine is only for you. Do not share this medicine with others. What if I miss a dose? If you get this medication at the hospital or clinic: It is important not to miss your dose. Call your care team if you are unable to keep an appointment. If you give yourself this medication at home: If you miss a dose, take it as soon as you can. If it is almost time for your next dose, take only that dose. Do not take double or extra doses. Call your care team with questions. What may interact with this medication? Do not take this medication with any of the following: Cisapride Dronedarone Pimozide Thioridazine This medication may also interact with the following: Other medications that cause heart rhythm changes This list may  not describe all possible interactions. Give your health care provider a list of all the medicines, herbs, non-prescription drugs, or dietary supplements you use. Also tell them if you smoke, drink alcohol, or use illegal drugs. Some items may interact with your medicine. What should I watch for while using this medication? Your condition will be monitored carefully while you are receiving this medication. You may need blood work while  taking this medication. Do not rub or scratch injection site. There may be a lump at the injection site, or it may be red or sore for a few days after your dose. This medication may cause infertility. Talk to your care team if you are concerned about your fertility. What side effects may I notice from receiving this medication? Side effects that you should report to your care team as soon as possible: Allergic reactions or angioedema--skin rash, itching or hives, swelling of the face, eyes, lips, tongue, arms, or legs, trouble swallowing or breathing Heart rhythm changes--fast or irregular heartbeat, dizziness, feeling faint or lightheaded, chest pain, trouble breathing Side effects that usually do not require medical attention (report to your care team if they continue or are bothersome): Hot flashes Pain, redness, or irritation at injection site Weight gain This list may not describe all possible side effects. Call your doctor for medical advice about side effects. You may report side effects to FDA at 1-800-FDA-1088. Where should I keep my medication? Keep out of the reach of children and pets. This medication is usually given in a hospital or clinic and will not be stored at home. In rare cases, this medication may be given at home. If you are using this medication at home, you will be instructed on how to store this medication. Get rid of any unused medication after the expiration date. To get rid of medications that are no longer needed or have expired: Take the medication to a medication take-back program. Check with your pharmacy or law enforcement to find a location. If you cannot return the medication, ask your pharmacist or care team how to get rid of this medication safely. NOTE: This sheet is a summary. It may not cover all possible information. If you have questions about this medicine, talk to your doctor, pharmacist, or health care provider.  2023 Elsevier/Gold Standard  (2021-08-03 00:00:00)        To help prevent nausea and vomiting after your treatment, we encourage you to take your nausea medication as directed.  BELOW ARE SYMPTOMS THAT SHOULD BE REPORTED IMMEDIATELY: *FEVER GREATER THAN 100.4 F (38 C) OR HIGHER *CHILLS OR SWEATING *NAUSEA AND VOMITING THAT IS NOT CONTROLLED WITH YOUR NAUSEA MEDICATION *UNUSUAL SHORTNESS OF BREATH *UNUSUAL BRUISING OR BLEEDING *URINARY PROBLEMS (pain or burning when urinating, or frequent urination) *BOWEL PROBLEMS (unusual diarrhea, constipation, pain near the anus) TENDERNESS IN MOUTH AND THROAT WITH OR WITHOUT PRESENCE OF ULCERS (sore throat, sores in mouth, or a toothache) UNUSUAL RASH, SWELLING OR PAIN  UNUSUAL VAGINAL DISCHARGE OR ITCHING   Items with * indicate a potential emergency and should be followed up as soon as possible or go to the Emergency Department if any problems should occur.  Please show the CHEMOTHERAPY ALERT CARD or IMMUNOTHERAPY ALERT CARD at check-in to the Emergency Department and triage nurse.  Should you have questions after your visit or need to cancel or reschedule your appointment, please contact [facility] [phone]  and follow the prompts.  Office hours are 8:00 a.m. to 4:30 p.m.  Monday - Friday. Please note that voicemails left after 4:00 p.m. may not be returned until the following business day.  We are closed weekends and major holidays. You have access to a nurse at all times for urgent questions. Please call the main number to the clinic [phone] and follow the prompts.  For any non-urgent questions, you may also contact your provider using MyChart. We now offer e-Visits for anyone 15 and older to request care online for non-urgent symptoms. For details visit [email or web address].   Also download the MyChart app! Go to the app store, search "MyChart", open the app, select [facility], and log in with your MyChart username and password.  Masks are  optional in the cancer centers. If you would like for your care team to wear a mask while they are taking care of you, please let them know. For doctor visits, patients may have with them one support person who is at least 72 years old. At this time, visitors are not allowed in the infusion area.

## 2021-10-27 NOTE — Patient Instructions (Signed)
[facility] at [facility] Discharge Instructions   You were seen and examined today by Dr. Delton Coombes.  He discussed the results from the special PET scan we did checking for prostate cancer. It showed wide spread prostate cancer.   We will hold off on giving you chemo for your lung cancer. We will focus on treating the prostate cancer. We will send in a prescription for pills called Zytiga. This will come from a specialty pharmacy. They will call you to set up delivery.    Thank you for choosing [facility] at [facility] to provide your oncology and hematology care.  To afford each patient quality time with our provider, please arrive at least 15 minutes before your scheduled appointment time.   If you have a lab appointment with the [facility] please come in thru the Main Entrance and check in at the main information desk.  You need to re-schedule your appointment should you arrive 10 or more minutes late.  We strive to give you quality time with our providers, and arriving late affects you and other patients whose appointments are after yours.  Also, if you no show three or more times for appointments you may be dismissed from the clinic at the providers discretion.     Again, thank you for choosing [facility].  Our hope is that these requests will decrease the amount of time that you wait before being seen by our physicians.       _____________________________________________________________  Should you have questions after your visit to [facility], please contact our office at [phone] and follow the prompts.  Our office hours are 8:00 a.m. and 4:30 p.m. Monday - Friday.  Please note that voicemails left after 4:00 p.m. may not be returned until the following business day.  We are closed weekends and major holidays.  You do have access to a nurse 24-7, just call the main number to the clinic [phone] and do  not press any options, hold on the line and a nurse will answer the phone.    For prescription refill requests, have your pharmacy contact our office and allow 72 hours.    Due to Covid, you will need to wear a mask upon entering the hospital. If you do not have a mask, a mask will be given to you at the Main Entrance upon arrival. For doctor visits, patients may have 1 support person age 59 or older with them. For treatment visits, patients can not have anyone with them due to social distancing guidelines and our immunocompromised population.

## 2021-10-27 NOTE — Telephone Encounter (Signed)
Oral Oncology Patient Advocate Encounter   Received notification that prior authorization for Abiraterone is required.   PA submitted on 10/27/2021 Key [id number] Status is pending     Lady Deutscher, CPhT-Adv Pharmacy Patient Advocate Specialist [place] Patient Advocate Team Direct Number: [phone]  Fax: [phone]

## 2021-10-28 ENCOUNTER — Telehealth: Payer: Self-pay | Admitting: Pharmacist

## 2021-10-28 ENCOUNTER — Other Ambulatory Visit (HOSPITAL_COMMUNITY): Payer: Self-pay

## 2021-10-28 NOTE — Telephone Encounter (Signed)
Oral Oncology Pharmacist Encounter  Received new prescription for Zytiga (abiraterone) for the treatment of metastatic castration sensitive prostate cancer in conjunction with prednisone and ADT, planned duration until disease progression or unacceptable drug toxicity.  CMP from 10/27/21 assessed, no relevant lab abnormalities. Prescription dose and frequency assessed.   Current medication list in Epic reviewed, one DDIs with abiraterone identified: Tamsulosin: abiraterone may increase the serum concentration of Tamsulosin. Monitor for increased tamsulosin effects (eg, hypotension, orthostasis).   Evaluated chart and no patient barriers to medication adherence identified.   Prescription has been e-scribed to the [facility] for benefits analysis and approval.  Oral Oncology Clinic will continue to follow for insurance authorization, copayment issues, initial counseling and start date.   Darl Pikes, PharmD, BCPS, BCOP, CPP Hematology/Oncology Clinical Pharmacist Practitioner [place]/DB/AP Oral [facility] [phone]  10/28/2021 12:06 PM

## 2021-11-02 ENCOUNTER — Other Ambulatory Visit: Payer: Self-pay | Admitting: *Deleted

## 2021-11-02 ENCOUNTER — Other Ambulatory Visit (HOSPITAL_COMMUNITY): Payer: Self-pay

## 2021-11-02 MED ORDER — HYDROCODONE-ACETAMINOPHEN 10-325 MG PO TABS: ORAL_TABLET | ORAL | 0 refills | Status: DC

## 2021-11-02 NOTE — Telephone Encounter (Signed)
Oral Chemotherapy Pharmacist Encounter  [facility] will deliver medication to Mr. Ferrall tomorrow 11/03/21. He still needs to pick up his prednisone from his local pharmacy. He knows to get started when he has both medication in hand.   Patient Education I spoke with patient for overview of new oral chemotherapy medication:  Zytiga (abiraterone) for the treatment of metastatic castration sensitive prostate cancer in conjunction with prednisone and ADT, planned duration until disease progression or unacceptable drug toxicity.   Pt is doing well. Counseled patient on administration, dosing, side effects, monitoring, drug-food interactions, safe handling, storage, and disposal. Patient will take: Abiraterone: Take 4 tablets (1,000 mg total) by mouth daily. Take on an empty stomach 1 hour before or 2 hours after a meal Prednisone: Take 1 tablet (5 mg total) by mouth daily with breakfast  Side effects include but not limited to: fatigue, hypertension, edema.    Reviewed with patient importance of keeping a medication schedule and plan for any missed doses.  After discussion with patient no patient barriers to medication adherence identified.   Mr. Badolato voiced understanding and appreciation. All questions answered. Medication handout provided.  Provided patient with Oral [facility] phone number. Patient knows to call the office with questions or concerns. Oral Chemotherapy Navigation Clinic will continue to follow.  Darl Pikes, PharmD, BCPS, BCOP, CPP Hematology/Oncology Clinical Pharmacist Practitioner [place]/DB/AP Oral [facility] [phone]  11/02/2021 8:58 AM

## 2021-11-03 ENCOUNTER — Other Ambulatory Visit: Payer: Medicare Other

## 2021-11-03 ENCOUNTER — Ambulatory Visit: Payer: Medicare Other

## 2021-11-07 ENCOUNTER — Other Ambulatory Visit (HOSPITAL_COMMUNITY): Payer: Self-pay

## 2021-11-16 ENCOUNTER — Inpatient Hospital Stay: Payer: Medicare Other

## 2021-11-16 ENCOUNTER — Ambulatory Visit: Payer: Medicare Other | Admitting: Hematology

## 2021-11-16 ENCOUNTER — Inpatient Hospital Stay (HOSPITAL_BASED_OUTPATIENT_CLINIC_OR_DEPARTMENT_OTHER): Payer: Medicare Other | Admitting: Hematology

## 2021-11-16 ENCOUNTER — Other Ambulatory Visit: Payer: Medicare Other

## 2021-11-16 ENCOUNTER — Other Ambulatory Visit: Payer: Self-pay | Admitting: *Deleted

## 2021-11-16 ENCOUNTER — Other Ambulatory Visit (HOSPITAL_COMMUNITY): Payer: Self-pay

## 2021-11-16 ENCOUNTER — Ambulatory Visit: Payer: Medicare Other

## 2021-11-16 VITALS — BP 131/80 | HR 86 | Temp 98.0°F | Resp 20 | Ht 73.0 in | Wt 129.2 lb

## 2021-11-16 DIAGNOSIS — C3492 Malignant neoplasm of unspecified part of left bronchus or lung: Secondary | ICD-10-CM | POA: Diagnosis not present

## 2021-11-16 DIAGNOSIS — C61 Malignant neoplasm of prostate: Secondary | ICD-10-CM

## 2021-11-16 LAB — CBC WITH DIFFERENTIAL/PLATELET
Abs Immature Granulocytes: 0.01 10*3/uL (ref 0.00–0.07)
Basophils Absolute: 0 10*3/uL (ref 0.0–0.1)
Basophils Relative: 0 %
Eosinophils Absolute: 0.2 10*3/uL (ref 0.0–0.5)
Eosinophils Relative: 5 %
HCT: 31.6 % — ABNORMAL LOW (ref 39.0–52.0)
Hemoglobin: 10.1 g/dL — ABNORMAL LOW (ref 13.0–17.0)
Immature Granulocytes: 0 %
Lymphocytes Relative: 16 %
Lymphs Abs: 0.7 10*3/uL (ref 0.7–4.0)
MCH: 32.6 pg (ref 26.0–34.0)
MCHC: 32 g/dL (ref 30.0–36.0)
MCV: 101.9 fL — ABNORMAL HIGH (ref 80.0–100.0)
Monocytes Absolute: 0.8 10*3/uL (ref 0.1–1.0)
Monocytes Relative: 17 %
Neutro Abs: 2.7 10*3/uL (ref 1.7–7.7)
Neutrophils Relative %: 62 %
Platelets: 152 10*3/uL (ref 150–400)
RBC: 3.1 MIL/uL — ABNORMAL LOW (ref 4.22–5.81)
RDW: 17.5 % — ABNORMAL HIGH (ref 11.5–15.5)
WBC: 4.5 10*3/uL (ref 4.0–10.5)
nRBC: 0 % (ref 0.0–0.2)

## 2021-11-16 LAB — COMPREHENSIVE METABOLIC PANEL
ALT: 12 U/L (ref 0–44)
AST: 17 U/L (ref 15–41)
Albumin: 3.6 g/dL (ref 3.5–5.0)
Alkaline Phosphatase: 65 U/L (ref 38–126)
Anion gap: 4 — ABNORMAL LOW (ref 5–15)
BUN: 40 mg/dL — ABNORMAL HIGH (ref 8–23)
CO2: 25 mmol/L (ref 22–32)
Calcium: 8.9 mg/dL (ref 8.9–10.3)
Chloride: 109 mmol/L (ref 98–111)
Creatinine, Ser: 2.31 mg/dL — ABNORMAL HIGH (ref 0.61–1.24)
GFR, Estimated: 29 mL/min — ABNORMAL LOW (ref >60)
Glucose, Bld: 89 mg/dL (ref 70–99)
Potassium: 4.5 mmol/L (ref 3.5–5.1)
Sodium: 138 mmol/L (ref 135–145)
Total Bilirubin: 0.5 mg/dL (ref 0.3–1.2)
Total Protein: 6.9 g/dL (ref 6.5–8.1)

## 2021-11-16 LAB — MAGNESIUM: Magnesium: 2 mg/dL (ref 1.7–2.4)

## 2021-11-16 NOTE — Patient Instructions (Addendum)
[place] at [facility] Discharge Instructions   You were seen and examined today by Dr. Delton Coombes.  He reviewed your lab results from today which are normal/stable/improved.   Continue Zytiga as prescribed.   We will see you back in 2 weeks with repeat blood work,   Thank you for choosing [place] at [facility] to provide your oncology and hematology care.  To afford each patient quality time with our provider, please arrive at least 15 minutes before your scheduled appointment time.   If you have a lab appointment with the [facility] please come in thru the Main Entrance and check in at the main information desk.  You need to re-schedule your appointment should you arrive 10 or more minutes late.  We strive to give you quality time with our providers, and arriving late affects you and other patients whose appointments are after yours.  Also, if you no show three or more times for appointments you may be dismissed from the clinic at the providers discretion.     Again, thank you for choosing [facility]  Our hope is that these requests will decrease the amount of time that you wait before being seen by our physicians.       _____________________________________________________________  Should you have questions after your visit to [facility], please contact our office at [phone] and follow the prompts.  Our office hours are 8:00 a.m. and 4:30 p.m. Monday - Friday.  Please note that voicemails left after 4:00 p.m. may not be returned until the following business day.  We are closed weekends and major holidays.  You do have access to a nurse 24-7, just call the main number to the clinic [phone] and do not press any options, hold on the line and a nurse will answer the phone.    For prescription refill requests, have your pharmacy contact our office and allow 72 hours.    Due to Covid, you will need  to wear a mask upon entering the hospital. If you do not have a mask, a mask will be given to you at the Main Entrance upon arrival. For doctor visits, patients may have 1 support person age 51 or older with them. For treatment visits, patients can not have anyone with them due to social distancing guidelines and our immunocompromised population.

## 2021-11-16 NOTE — Progress Notes (Signed)
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  PRIOR THERAPY: Carboplatin, paclitaxel and Keytruda x 6 cycles from 05/03/2018 to 08/21/2018  NGS Results: Foundation 1 MS--stable  CURRENT THERAPY: Gemcitabine D1,8 q21d and degarelix monthly.   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   07/28/2014 Initial Diagnosis   Oropharyngeal cancer   08/03/2014 Imaging   CT CAP- L supraclavicular lymphadenopathy is not completely visualized. This is better seen on the previous neck CT from 07/27/2014. Otherwise, no evidence for metastatic disease in the chest, abdomen, or pelvis.   08/03/2014 Imaging   Bone scan- Uptake at adjacent anterior LEFT 6, 7, 8 ribs likely representing trauma/fractures. Questionable nonspecific increased tracer localization at the posterior RIGHT 8th and 9th ribs, the adjacent nature which raises a a question of trauma as well   08/06/2014 Pathology Results   Dr. Benjamine Mola- Oropharynx, biopsy, Left - INVASIVE SQUAMOUS CELL CARCINOMA.   08/12/2014 Procedure   Dr. Enrique Sack- 1. Multiple extraction of tooth numbers 6, 17, 22, 23, 24, 25, 26, and 27. 3 Quadrants of alveoloplasty   08/17/2014 Pathology Results   PORT and G-TUBE placed by Dr. Carlis Stable.   08/26/2014 PET scan   Large hypermetabolic mass in the left base of tongue. Activity extends across midline to the right base tongue. 2. Intensely hypermetabolic left cervical metastatic lymph nodes. Lymph nodes extend from the left level II position to the left supraclavi   09/01/2014 - 09/22/2014 Chemotherapy   Concurrent  chemoradiation with Cisplatin 100 mg/m2 x 2 cycles with Neulasta support. Held cycle #3 d/t renal toxicity.    09/03/2014 - 10/23/2014 Radiation Therapy   Treated in [place], IMRT Isidore Moos).  Base of tongue and bilat neck. Total dose: 70 Gy in 35 fractions. (of note, he did miss several treatments requiring BID dosing towards the end of treatment).    01/25/2015 PET scan   Near complete resolution of metabolic activity at the base of tongue. Minimal residual activity is likely post treatment effect. 2. Complete resolution of metabolic activity above LEFT cervical lymph nodes. No evidence of residual metabolically active    7/62/8315 Procedure   Port-a-cath removed [name])    05/03/2018 - 08/23/2018 Chemotherapy   The patient had dexamethasone (DECADRON) 4 MG tablet, 8 mg, Oral, Daily, 1 of 1 cycle, Start date: 05/01/2018, End date: 10/23/2018 palonosetron (ALOXI) injection 0.25 mg, 0.25 mg, Intravenous,  Once, 6 of 6 cycles Administration: 0.25 mg (05/03/2018), 0.25 mg (05/24/2018), 0.25 mg (06/14/2018), 0.25 mg (07/09/2018), 0.25 mg (07/30/2018), 0.25 mg (08/21/2018) pegfilgrastim-cbqv (UDENYCA) injection 6 mg, 6 mg, Subcutaneous, Once, 5 of 5 cycles Administration: 6 mg (05/27/2018), 6 mg (06/17/2018), 6 mg (07/11/2018), 6 mg (08/01/2018), 6 mg (08/23/2018) CARBOplatin (PARAPLATIN) 380 mg in sodium chloride 0.9 % 250 mL chemo infusion, 380 mg (100 % of original dose 381 mg), Intravenous,  Once, 6 of 6 cycles Dose modification:   (original dose 381 mg, Cycle 1),   (original dose 309.5 mg, Cycle 2), 307.5 mg (original dose 309.5 mg, Cycle 5) Administration: 380 mg (05/03/2018), 310 mg (05/24/2018), 310 mg (06/14/2018), 340 mg (07/09/2018), 310 mg (07/30/2018), 350 mg (08/21/2018) PACLitaxel (  TAXOL) 330 mg in sodium chloride 0.9 % 500 mL chemo infusion (> 72m/m2), 175 mg/m2 = 330 mg (100 % of original dose 175 mg/m2), Intravenous,  Once, 6 of 6 cycles Dose modification: 175 mg/m2 (original dose 175 mg/m2, Cycle 1, Reason:  Patient Age) Administration: 330 mg (05/03/2018), 330 mg (05/24/2018), 330 mg (06/14/2018), 330 mg (07/09/2018), 330 mg (07/30/2018), 330 mg (08/21/2018)  for chemotherapy treatment.    05/24/2018 - 05/06/2021 Chemotherapy   Patient is on Treatment Plan : HEAD/NECK Pembrolizumab/Taxol/Carboplatin Q21D     07/05/2021 -  Chemotherapy   Patient is on Treatment Plan : LUNG Gemcitabine D1,8 q21d     Squamous cell lung cancer, left ([facility]  06/14/2018 Initial Diagnosis   Squamous cell lung cancer, left ([facility])     CANCER STAGING:  Cancer Staging  Oropharyngeal carcinoma (HCC) Staging form: Pharynx - Oropharynx, AJCC 7th Edition - Clinical: Stage IVA (T4a, N2b, M0) - Unsigned   INTERVAL HISTORY:  Mr. CKALI AMBLER a 71y.o. male, returns for follow-up of lung cancer and prostate cancer.  He was started on degarelix to 40 mg on 10/27/2021.  Cycle 5-day 1 of gemcitabine was on 10/06/2021.   REVIEW OF SYSTEMS:  Review of Systems  Constitutional:  Negative for appetite change, fatigue and unexpected weight change.  Respiratory:  Negative for cough.   Gastrointestinal:  Negative for nausea.  Genitourinary:  Negative for difficulty urinating.   Musculoskeletal:  Positive for arthralgias (7/10 bilateral knee pain).  All other systems reviewed and are negative.   PAST MEDICAL/SURGICAL HISTORY:  Past Medical History:  Diagnosis Date   GERD (gastroesophageal reflux disease)    Mass of neck    dx. oropharyngeal squamous cell carcinoma- Chemo. radiation planned   [place]ropharyngeal cancer ([facility] 07/28/2014   dx. 3 weeks ago.- [facility]. [facility] [place]   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[place] 08/06/2014   SCCa of Left BOT   Past Surgical History:  Procedure Laterality Date   BIOPSY  01/15/2018   Procedure: BIOPSY;  Surgeon: FDanie Binder MD;  [facility];  Service: Endoscopy;;  gastric   COLONOSCOPY N/A 03/13/2016   Procedure: COLONOSCOPY;  Surgeon: SDanie Binder MD;   [facility];  Service: Endoscopy;  Laterality: N/A;  2:15 PM   CYSTOSCOPY N/A 08/29/2021   Procedure: CYSTOSCOPY;  Surgeon: MCleon Gustin MD;  Location: [facility];  Service: Urology;  Laterality: N/A;   CYSTOSCOPY WITH INSERTION OF UROLIFT N/A 06/27/2021   Procedure: CYSTOSCOPY WITH INSERTION OF UROLIFT;  Surgeon: MCleon Gustin MD;  Location: [facility];  Service: Urology;  Laterality: N/A;   ESOPHAGOGASTRODUODENOSCOPY (EGD) WITH PROPOFOL N/A 08/17/2014   Procedure: ESOPHAGOGASTRODUODENOSCOPY (EGD) WITH PROPOFOL (procedure #1);  Surgeon: MAviva SignsMd, MD;  Location: [facility];  Service: General;  Laterality: N/A;   ESOPHAGOGASTRODUODENOSCOPY (EGD) WITH PROPOFOL N/A 01/15/2018   Procedure: ESOPHAGOGASTRODUODENOSCOPY (EGD) WITH PROPOFOL;  Surgeon: FDanie Binder MD;  [facility]: [facility];  Service: Endoscopy;  Laterality: N/A;  9:30am   MULTIPLE EXTRACTIONS WITH ALVEOLOPLASTY N/A 08/12/2014   Procedure: Extraction of tooth #'s 6,17,22,23,24,25,26,27 with alveoloplasty;  Surgeon: RLenn Cal DDS;  Location: [facility];  Service: Oral Surgery;  Laterality: N/A;   PANENDOSCOPY N/A 08/06/2014   Procedure: PANENDOSCOPY WITH BIOPSY;  Surgeon: SLeta Baptist MD;  [facility]: [facility]  Service: ENT;  Laterality: N/A;   PEG PLACEMENT Left 08/17/14   PEG PLACEMENT N/A 08/17/2014   Procedure: PERCUTANEOUS ENDOSCOPIC  GASTROSTOMY (PEG) PLACEMENT (procedure #1);  Surgeon: Aviva Signs Md, MD;  Location: [facility];  Service: General;  Laterality: N/A;   PORT-A-CATH REMOVAL Right 07/17/2016   Procedure: MINOR REMOVAL PORT-A-CATH;  Surgeon: Aviva Signs, MD;  Location: [facility];  Service: General;  Laterality: Right;   PORTACATH PLACEMENT Right 08/17/14   PORTACATH PLACEMENT Right 08/17/2014   Procedure: INSERTION PORT-A-CATH (procedure #2);  Surgeon: Aviva Signs Md, MD;  Location: [facility];  Service: General;  Laterality: Right;   PORTACATH PLACEMENT Left 04/26/2018   Procedure: INSERTION  PORT-A-CATH (attached catheter in left subclavian);  Surgeon: Aviva Signs, MD;  Location: [facility];  Service: General;  Laterality: Left;   SAVORY DILATION N/A 01/15/2018   Procedure: SAVORY DILATION;  Surgeon: Danie Binder, MD;  [facility];  Service: Endoscopy;  Laterality: N/A;   TRANSURETHRAL RESECTION OF PROSTATE N/A 08/29/2021   Procedure: TRANSURETHRAL RESECTION OF THE PROSTATE (TURP);  Surgeon: Cleon Gustin, MD;  Location: [facility];  Service: Urology;  Laterality: N/A;   VIDEO BRONCHOSCOPY WITH ENDOBRONCHIAL ULTRASOUND N/A 04/15/2018   Procedure: VIDEO BRONCHOSCOPY WITH ENDOBRONCHIAL ULTRASOUND;  Surgeon: Melrose Nakayama, MD;  [facility];  Service: Thoracic;  Laterality: N/A;    SOCIAL HISTORY:  Social History   Socioeconomic History   Marital status: Legally Separated    Spouse name: Not on file   Number of children: 5   Years of education: Not on file   Highest education level: Not on file  Occupational History   Not on file  Tobacco Use   Smoking status: Former    Packs/day: 0.50    Years: 30.00    Total pack years: 15.00    Types: Cigarettes    Quit date: 07/22/2014    Years since quitting: 7.3   Smokeless tobacco: Never  Vaping Use   Vaping Use: Never used  Substance and Sexual Activity   Alcohol use: Not Currently    Alcohol/week: 0.0 standard drinks of alcohol    Comment: None currently (11/09/17); previously 1-2 beers on the weekend   Drug use: No   Sexual activity: Yes  Other Topics Concern   Not on file  Social History Narrative   Not on file   Social Determinants of Health   Financial Resource Strain: Low Risk  (02/12/2020)   Overall Financial Resource Strain (CARDIA)    Difficulty of Paying Living Expenses: Not hard at all  Food Insecurity: No Food Insecurity (02/12/2020)   Hunger Vital Sign    Worried About Running Out of Food in the Last Year: Never true    Ran Out of Food in the Last Year: Never true  Transportation  Needs: No Transportation Needs (02/12/2020)   PRAPARE - Hydrologist (Medical): No    Lack of Transportation (Non-Medical): No  Physical Activity: Inactive (02/12/2020)   Exercise Vital Sign    Days of Exercise per Week: 0 days    Minutes of Exercise per Session: 0 min  Stress: No Stress Concern Present (02/12/2020)   [place]    Feeling of Stress : Not at all  Social Connections: Moderately Isolated (02/12/2020)   Social Connection and Isolation Panel [NHANES]    Frequency of Communication with Friends and Family: More than three times a week    Frequency of Social Gatherings with Friends and Family: Twice a week    Attends Religious Services: Never    Active Member  of Clubs or Organizations: No    Attends Archivist Meetings: Never    Marital Status: Married  Human resources officer Violence: Not At Risk (02/12/2020)   Humiliation, Afraid, Rape, and Kick questionnaire    Fear of Current or Ex-Partner: No    Emotionally Abused: No    Physically Abused: No    Sexually Abused: No    FAMILY HISTORY:  Family History  Problem Relation Age of Onset   Colon cancer Neg Hx    Gastric cancer Neg Hx    Esophageal cancer Neg Hx     CURRENT MEDICATIONS:  Current Outpatient Medications  Medication Sig Dispense Refill   abiraterone acetate (ZYTIGA) 250 MG tablet Take 4 tablets (1,000 mg total) by mouth daily. Take on an empty stomach 1 hour before or 2 hours after a meal 120 tablet 3   Gemcitabine HCl (GEMZAR IV) Inject into the vein once a week. Days 1 & 8 q 21 days     HYDROcodone-acetaminophen (NORCO) 10-325 MG tablet TAKE 1 TABLET EVERY 12 HOURS AS NEEDED. 60 tablet 0   levothyroxine (SYNTHROID) 88 MCG tablet TAKE ONE TABLET BY MOUTH DAILY BEFORE BREAKFAST 30 tablet 2   naproxen sodium (ALEVE) 220 MG tablet Take 220 mg by mouth daily as needed (pain.).     omeprazole (PRILOSEC) 20 MG  capsule Take 1 capsule (20 mg total) by mouth daily. 30 capsule 3   predniSONE (DELTASONE) 5 MG tablet Take 1 tablet (5 mg total) by mouth daily with breakfast. 30 tablet 6   sildenafil (VIAGRA) 100 MG tablet Take 1 tablet (100 mg total) by mouth as needed for erectile dysfunction. 10 tablet 6   tadalafil (CIALIS) 20 MG tablet Take 1 tablet (20 mg total) by mouth daily as needed. 10 tablet 5   tamsulosin (FLOMAX) 0.4 MG CAPS capsule Take 1 capsule (0.4 mg total) by mouth 2 (two) times daily. 60 capsule 11   No current facility-administered medications for this visit.    ALLERGIES:  No Known Allergies  PHYSICAL EXAM:  Performance status (ECOG): 1 - Symptomatic but completely ambulatory  There were no vitals filed for this visit. Wt Readings from Last 3 Encounters:  10/27/21 128 lb 12.8 oz (58.4 kg)  10/14/21 128 lb 9.6 oz (58.3 kg)  10/06/21 129 lb 6.4 oz (58.7 kg)   Physical Exam Vitals reviewed.  Constitutional:      Appearance: Normal appearance.  Cardiovascular:     Rate and Rhythm: Normal rate and regular rhythm.     Pulses: Normal pulses.     Heart sounds: Normal heart sounds.  Pulmonary:     Effort: Pulmonary effort is normal.     Breath sounds: Normal breath sounds.  Neurological:     General: No focal deficit present.     Mental Status: He is alert and oriented to person, place, and time.  Psychiatric:        Mood and Affect: Mood normal.        Behavior: Behavior normal.    LABORATORY DATA:  I have reviewed the labs as listed.     Latest Ref Rng & Units 10/27/2021   11:21 AM 10/14/2021   10:09 AM 10/03/2021   10:54 AM  CBC  WBC 4.0 - 10.5 K/uL 4.8  3.4  4.5   Hemoglobin 13.0 - 17.0 g/dL 8.6  8.2  8.1   Hematocrit 39.0 - 52.0 % 26.2  25.3  24.6   Platelets 150 - 400 K/uL 176  343  120       Latest Ref Rng & Units 10/27/2021   11:21 AM 10/14/2021   10:09 AM 10/03/2021   10:54 AM  CMP  Glucose 70 - 99 mg/dL 95  98  98   BUN 8 - 23 mg/dL 43  37  46    Creatinine 0.61 - 1.24 mg/dL 3.61  2.55  2.47   Sodium 135 - 145 mmol/L 138  140  138   Potassium 3.5 - 5.1 mmol/L 4.7  4.9  4.6   Chloride 98 - 111 mmol/L 110  110  108   CO2 22 - 32 mmol/L 20  24  23    Calcium 8.9 - 10.3 mg/dL 9.0  8.6  8.8   Total Protein 6.5 - 8.1 g/dL 7.2  6.8  7.1   Total Bilirubin 0.3 - 1.2 mg/dL 0.8  0.3  0.4   Alkaline Phos 38 - 126 U/L 61  61  57   AST 15 - 41 U/L 18  23  18    ALT 0 - 44 U/L 13  25  20      DIAGNOSTIC IMAGING:  I have independently reviewed the scans and discussed with the patient. No results found.   ASSESSMENT:  1.  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 -6 cycles of carboplatin, paclitaxel and pembrolizumab from 05/03/2018 through 08/21/2018. -Maintenance pembrolizumab started on 09/11/2018. -PET scan on 09/15/2019 showed interval decrease in hypermetabolic areas associated with tongue and floor of the mouth.  Hypermetabolic metastatic lymphadenopathy in the chest is stable.  No new sites seen. -PET scan on 03/15/2020 shows persistent, stable hypermetabolism in the tongue/floor of mouth.  Slight interval decrease in hypermetabolism with mediastinal/hilar adenopathy.  Persistent hypermetabolic focus in the right supraclavicular region.  New focus of hypermetabolic them identified in the right external iliac chain of pelvis with no discernible adenopathy on the CT. -CT CAP on 11/24/2020 showed left lower lobe lung nodule measuring 1.3 x 1.1 cm, previously 1.0 x 0.9 cm on CT scan from June.  Lesion was negative on PSMA PET scan.  Stable mediastinal lymph nodes. 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 - 6 cycles of carboplatin, paclitaxel and pembrolizumab from 01/12/2021 through 05/04/2021 with progression. - NGS test: No targetable mutations.  PD-L1 TPS is negative.  MSI-stable.  TMB-low. - Gemcitabine 2 weeks on/1 week off started  on 07/05/2021.   2.  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  3.  Prostate cancer: - Evaluated by Dr. Alyson Ingles and biopsy was recommended.  Patient declined. - Last PSA was 99.7 on 10/03/2021. - We reviewed PSMA PET scan (10/13/2021): New multifocal bone mets in the pelvis, spine, bilateral ribs.  New mediastinal and hilar lymph nodes.  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  Intense radiotracer avid tumor within the prostate gland.  Persistent radiotracer avid pelvic lymph nodes. - Degarelix loading dose started on 10/27/2021.  Abiraterone 1000 mg daily started on 11/03/2021.   4.  Left kidney mass: - Biopsy of the left kidney mass was consistent with metastatic squamous cell carcinoma.   PLAN:  1.  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    2.  Hypothyroidism: - Continue Synthroid 88 mcg daily.  Last TSH was 2.3.   3.  Bilateral knee pains: - Continue hydrocodone twice daily as needed.   4.  Nutrition: - Continue 2 ensures per day.  To gain weight.   5.  Anemia/CKD: - Anemia from myelosuppression and CKD.  Status post Venofer.  Today hemoglobin improved to 10.1.  6.  Prostate cancer Gleason 4+5=9: - Degarelix was started on 10/27/2021. - Abiraterone 1000 mg daily started on 11/03/2021. - He is tolerating Abiraterone very well.  No GI side effects.  Blood pressure and potassium today are within normal limits.  He will come back next week for Diaglyk's.  I will see him back in 2 weeks for follow-up.   Orders placed this encounter:  No orders of the defined types were placed in this encounter.    Derek Jack, MD [place] [phone]

## 2021-11-16 NOTE — Progress Notes (Signed)
Patient is taking Zytiga as prescribed.  He has not missed any doses and reports no side effects at this time.

## 2021-11-22 ENCOUNTER — Other Ambulatory Visit (HOSPITAL_COMMUNITY): Payer: Self-pay

## 2021-11-24 ENCOUNTER — Inpatient Hospital Stay: Payer: Medicare Other

## 2021-11-24 ENCOUNTER — Other Ambulatory Visit: Payer: Medicare Other

## 2021-11-24 ENCOUNTER — Other Ambulatory Visit (HOSPITAL_COMMUNITY): Payer: Self-pay

## 2021-11-24 ENCOUNTER — Ambulatory Visit: Payer: Medicare Other

## 2021-11-28 ENCOUNTER — Other Ambulatory Visit: Payer: Self-pay | Admitting: Hematology

## 2021-11-30 ENCOUNTER — Inpatient Hospital Stay: Payer: Medicare Other

## 2021-11-30 ENCOUNTER — Inpatient Hospital Stay: Payer: Medicare Other | Attending: Hematology | Admitting: Hematology

## 2021-12-02 ENCOUNTER — Other Ambulatory Visit (HOSPITAL_COMMUNITY): Payer: Self-pay

## 2021-12-02 ENCOUNTER — Other Ambulatory Visit: Payer: Self-pay

## 2021-12-02 MED ORDER — HYDROCODONE-ACETAMINOPHEN 10-325 MG PO TABS: ORAL_TABLET | ORAL | 0 refills | Status: DC

## 2021-12-05 ENCOUNTER — Telehealth: Payer: Self-pay | Admitting: Dietician

## 2021-12-05 NOTE — Telephone Encounter (Signed)
Attempted to contact patient via telephone for nutrition follow-up. Left VM with request for return call. Contact information provided.

## 2021-12-23 ENCOUNTER — Other Ambulatory Visit (HOSPITAL_COMMUNITY): Payer: Self-pay

## 2021-12-29 ENCOUNTER — Other Ambulatory Visit: Payer: Self-pay

## 2021-12-29 ENCOUNTER — Emergency Department (HOSPITAL_COMMUNITY): Payer: 59

## 2021-12-29 ENCOUNTER — Encounter (HOSPITAL_COMMUNITY): Payer: Self-pay | Admitting: Emergency Medicine

## 2021-12-29 ENCOUNTER — Inpatient Hospital Stay: Payer: 59 | Attending: Hematology

## 2021-12-29 ENCOUNTER — Observation Stay (HOSPITAL_COMMUNITY)
Admission: EM | Admit: 2021-12-29 | Discharge: 2021-12-30 | Disposition: A | Discharge status: Home / Self Care | Payer: 59 | Attending: Internal Medicine | Admitting: Internal Medicine

## 2021-12-29 ENCOUNTER — Inpatient Hospital Stay: Payer: 59 | Admitting: Hematology

## 2021-12-29 DIAGNOSIS — C7902 Secondary malignant neoplasm of left kidney and renal pelvis: Secondary | ICD-10-CM | POA: Insufficient documentation

## 2021-12-29 DIAGNOSIS — Z85818 Personal history of malignant neoplasm of other sites of lip, oral cavity, and pharynx: Secondary | ICD-10-CM | POA: Diagnosis not present

## 2021-12-29 DIAGNOSIS — Z85810 Personal history of malignant neoplasm of tongue: Secondary | ICD-10-CM | POA: Diagnosis not present

## 2021-12-29 DIAGNOSIS — N1832 Chronic kidney disease, stage 3b: Secondary | ICD-10-CM | POA: Insufficient documentation

## 2021-12-29 DIAGNOSIS — J209 Acute bronchitis, unspecified: Secondary | ICD-10-CM | POA: Insufficient documentation

## 2021-12-29 DIAGNOSIS — Z79890 Hormone replacement therapy (postmenopausal): Secondary | ICD-10-CM | POA: Insufficient documentation

## 2021-12-29 DIAGNOSIS — N189 Chronic kidney disease, unspecified: Secondary | ICD-10-CM | POA: Insufficient documentation

## 2021-12-29 DIAGNOSIS — Z87891 Personal history of nicotine dependence: Secondary | ICD-10-CM | POA: Insufficient documentation

## 2021-12-29 DIAGNOSIS — C3492 Malignant neoplasm of unspecified part of left bronchus or lung: Secondary | ICD-10-CM | POA: Diagnosis not present

## 2021-12-29 DIAGNOSIS — C109 Malignant neoplasm of oropharynx, unspecified: Secondary | ICD-10-CM | POA: Diagnosis present

## 2021-12-29 DIAGNOSIS — M25562 Pain in left knee: Secondary | ICD-10-CM | POA: Insufficient documentation

## 2021-12-29 DIAGNOSIS — Z20822 Contact with and (suspected) exposure to covid-19: Secondary | ICD-10-CM | POA: Diagnosis not present

## 2021-12-29 DIAGNOSIS — J440 Chronic obstructive pulmonary disease with acute lower respiratory infection: Secondary | ICD-10-CM | POA: Diagnosis not present

## 2021-12-29 DIAGNOSIS — M25561 Pain in right knee: Secondary | ICD-10-CM | POA: Insufficient documentation

## 2021-12-29 DIAGNOSIS — Z79899 Other long term (current) drug therapy: Secondary | ICD-10-CM | POA: Insufficient documentation

## 2021-12-29 DIAGNOSIS — R0602 Shortness of breath: Secondary | ICD-10-CM | POA: Diagnosis present

## 2021-12-29 DIAGNOSIS — J9601 Acute respiratory failure with hypoxia: Secondary | ICD-10-CM | POA: Diagnosis not present

## 2021-12-29 DIAGNOSIS — N183 Chronic kidney disease, stage 3 unspecified: Secondary | ICD-10-CM

## 2021-12-29 DIAGNOSIS — C61 Malignant neoplasm of prostate: Secondary | ICD-10-CM | POA: Insufficient documentation

## 2021-12-29 DIAGNOSIS — Z9221 Personal history of antineoplastic chemotherapy: Secondary | ICD-10-CM | POA: Insufficient documentation

## 2021-12-29 DIAGNOSIS — N138 Other obstructive and reflux uropathy: Secondary | ICD-10-CM | POA: Diagnosis present

## 2021-12-29 DIAGNOSIS — Z923 Personal history of irradiation: Secondary | ICD-10-CM | POA: Insufficient documentation

## 2021-12-29 DIAGNOSIS — D631 Anemia in chronic kidney disease: Secondary | ICD-10-CM | POA: Insufficient documentation

## 2021-12-29 DIAGNOSIS — E039 Hypothyroidism, unspecified: Secondary | ICD-10-CM | POA: Insufficient documentation

## 2021-12-29 DIAGNOSIS — C7951 Secondary malignant neoplasm of bone: Secondary | ICD-10-CM | POA: Insufficient documentation

## 2021-12-29 LAB — URINALYSIS, ROUTINE W REFLEX MICROSCOPIC
Bilirubin Urine: NEGATIVE
Glucose, UA: NEGATIVE mg/dL
Ketones, ur: 20 mg/dL — AB
Nitrite: NEGATIVE
Protein, ur: 100 mg/dL — AB
Specific Gravity, Urine: 1.019 (ref 1.005–1.030)
WBC, UA: >50 WBC/hpf — ABNORMAL HIGH (ref 0–5)
pH: 5 (ref 5.0–8.0)

## 2021-12-29 LAB — COMPREHENSIVE METABOLIC PANEL
ALT: 16 U/L (ref 0–44)
ALT: 16 U/L (ref 0–44)
AST: 20 U/L (ref 15–41)
AST: 19 U/L (ref 15–41)
Albumin: 3.3 g/dL — ABNORMAL LOW (ref 3.5–5.0)
Albumin: 3.3 g/dL — ABNORMAL LOW (ref 3.5–5.0)
Alkaline Phosphatase: 92 U/L (ref 38–126)
Alkaline Phosphatase: 92 U/L (ref 38–126)
Anion gap: 12 (ref 5–15)
Anion gap: 13 (ref 5–15)
BUN: 36 mg/dL — ABNORMAL HIGH (ref 8–23)
BUN: 37 mg/dL — ABNORMAL HIGH (ref 8–23)
CO2: 22 mmol/L (ref 22–32)
CO2: 22 mmol/L (ref 22–32)
Calcium: 9 mg/dL (ref 8.9–10.3)
Calcium: 9 mg/dL (ref 8.9–10.3)
Chloride: 103 mmol/L (ref 98–111)
Chloride: 103 mmol/L (ref 98–111)
Creatinine, Ser: 1.98 mg/dL — ABNORMAL HIGH (ref 0.61–1.24)
Creatinine, Ser: 1.96 mg/dL — ABNORMAL HIGH (ref 0.61–1.24)
GFR, Estimated: 35 mL/min — ABNORMAL LOW (ref >60)
GFR, Estimated: 36 mL/min — ABNORMAL LOW (ref >60)
Glucose, Bld: 85 mg/dL (ref 70–99)
Glucose, Bld: 87 mg/dL (ref 70–99)
Potassium: 4 mmol/L (ref 3.5–5.1)
Potassium: 4.1 mmol/L (ref 3.5–5.1)
Sodium: 137 mmol/L (ref 135–145)
Sodium: 138 mmol/L (ref 135–145)
Total Bilirubin: 1.2 mg/dL (ref 0.3–1.2)
Total Bilirubin: 1.1 mg/dL (ref 0.3–1.2)
Total Protein: 7.1 g/dL (ref 6.5–8.1)
Total Protein: 7.1 g/dL (ref 6.5–8.1)

## 2021-12-29 LAB — CBC WITH DIFFERENTIAL/PLATELET
Abs Immature Granulocytes: 0.04 10*3/uL (ref 0.00–0.07)
Basophils Absolute: 0 10*3/uL (ref 0.0–0.1)
Basophils Relative: 0 %
Eosinophils Absolute: 0 10*3/uL (ref 0.0–0.5)
Eosinophils Relative: 0 %
HCT: 38.8 % — ABNORMAL LOW (ref 39.0–52.0)
Hemoglobin: 12.6 g/dL — ABNORMAL LOW (ref 13.0–17.0)
Immature Granulocytes: 0 %
Lymphocytes Relative: 5 %
Lymphs Abs: 0.6 10*3/uL — ABNORMAL LOW (ref 0.7–4.0)
MCH: 31.2 pg (ref 26.0–34.0)
MCHC: 32.5 g/dL (ref 30.0–36.0)
MCV: 96 fL (ref 80.0–100.0)
Monocytes Absolute: 0.8 10*3/uL (ref 0.1–1.0)
Monocytes Relative: 8 %
Neutro Abs: 9.2 10*3/uL — ABNORMAL HIGH (ref 1.7–7.7)
Neutrophils Relative %: 87 %
Platelets: 186 10*3/uL (ref 150–400)
RBC: 4.04 MIL/uL — ABNORMAL LOW (ref 4.22–5.81)
RDW: 14.4 % (ref 11.5–15.5)
WBC: 10.6 10*3/uL — ABNORMAL HIGH (ref 4.0–10.5)
nRBC: 0 % (ref 0.0–0.2)

## 2021-12-29 LAB — PROTIME-INR
INR: 1.1 (ref 0.8–1.2)
Prothrombin Time: 14.5 seconds (ref 11.4–15.2)

## 2021-12-29 LAB — RESP PANEL BY RT-PCR (FLU A&B, COVID) ARPGX2
Influenza A by PCR: NEGATIVE
Influenza B by PCR: NEGATIVE
SARS Coronavirus 2 by RT PCR: NEGATIVE

## 2021-12-29 LAB — LACTIC ACID, PLASMA
Lactic Acid, Venous: 1.1 mmol/L (ref 0.5–1.9)
Lactic Acid, Venous: 1.2 mmol/L (ref 0.5–1.9)

## 2021-12-29 LAB — PSA: Prostatic Specific Antigen: 17.75 ng/mL — ABNORMAL HIGH (ref 0.00–4.00)

## 2021-12-29 LAB — MAGNESIUM: Magnesium: 1.8 mg/dL (ref 1.7–2.4)

## 2021-12-29 LAB — TSH: TSH: 5.524 u[IU]/mL — ABNORMAL HIGH (ref 0.350–4.500)

## 2021-12-29 MED ORDER — ACETAMINOPHEN 325 MG PO TABS: 650.0000 mg | ORAL_TABLET | Freq: Four times a day (QID) | ORAL | Status: DC | PRN

## 2021-12-29 MED ORDER — PANTOPRAZOLE SODIUM 40 MG PO TBEC
40.0000 mg | DELAYED_RELEASE_TABLET | Freq: Every day | ORAL | Status: DC
  Administered 2021-12-29 – 2021-12-30 (×2): 40 mg via ORAL
  Filled 2021-12-29 (×3): qty 1

## 2021-12-29 MED ORDER — IPRATROPIUM-ALBUTEROL 0.5-2.5 (3) MG/3ML IN SOLN: 3.0000 mL | RESPIRATORY_TRACT | Status: DC | PRN

## 2021-12-29 MED ORDER — ABIRATERONE ACETATE 250 MG PO TABS: 1000.0000 mg | ORAL_TABLET | Freq: Every day | ORAL | Status: DC

## 2021-12-29 MED ORDER — METHYLPREDNISOLONE SODIUM SUCC 125 MG IJ SOLR
80.0000 mg | Freq: Once | INTRAMUSCULAR | Status: AC
  Administered 2021-12-29: 80 mg via INTRAVENOUS
  Filled 2021-12-29: qty 2

## 2021-12-29 MED ORDER — SODIUM CHLORIDE 0.9 % IV SOLN
500.0000 mg | INTRAVENOUS | Status: DC
  Administered 2021-12-29: 500 mg via INTRAVENOUS
  Filled 2021-12-29: qty 5

## 2021-12-29 MED ORDER — PREDNISONE 20 MG PO TABS
40.0000 mg | ORAL_TABLET | Freq: Every day | ORAL | Status: DC
  Filled 2021-12-29: qty 2

## 2021-12-29 MED ORDER — SODIUM CHLORIDE 0.9 % IV SOLN
1.0000 g | Freq: Once | INTRAVENOUS | Status: AC
  Administered 2021-12-29: 1 g via INTRAVENOUS
  Filled 2021-12-29: qty 10

## 2021-12-29 MED ORDER — GUAIFENESIN-DM 100-10 MG/5ML PO SYRP: 10.0000 mL | ORAL_SOLUTION | Freq: Three times a day (TID) | ORAL | Status: DC | PRN

## 2021-12-29 MED ORDER — METHYLPREDNISOLONE SODIUM SUCC 125 MG IJ SOLR
60.0000 mg | Freq: Two times a day (BID) | INTRAMUSCULAR | Status: DC
  Administered 2021-12-30: 60 mg via INTRAVENOUS
  Filled 2021-12-29: qty 2

## 2021-12-29 MED ORDER — TAMSULOSIN HCL 0.4 MG PO CAPS
0.4000 mg | ORAL_CAPSULE | Freq: Two times a day (BID) | ORAL | Status: DC
  Administered 2021-12-29 – 2021-12-30 (×2): 0.4 mg via ORAL
  Filled 2021-12-29 (×2): qty 1

## 2021-12-29 MED ORDER — IPRATROPIUM-ALBUTEROL 0.5-2.5 (3) MG/3ML IN SOLN
3.0000 mL | Freq: Three times a day (TID) | RESPIRATORY_TRACT | Status: DC
  Administered 2021-12-29: 3 mL via RESPIRATORY_TRACT
  Filled 2021-12-29: qty 3

## 2021-12-29 MED ORDER — IPRATROPIUM-ALBUTEROL 0.5-2.5 (3) MG/3ML IN SOLN
3.0000 mL | Freq: Three times a day (TID) | RESPIRATORY_TRACT | Status: DC
  Administered 2021-12-30: 3 mL via RESPIRATORY_TRACT
  Filled 2021-12-29: qty 3

## 2021-12-29 MED ORDER — LEVOTHYROXINE SODIUM 88 MCG PO TABS: 88.0000 ug | ORAL_TABLET | Freq: Every day | ORAL | Status: DC

## 2021-12-29 MED ORDER — HEPARIN SODIUM (PORCINE) 5000 UNIT/ML IJ SOLN
5000.0000 [IU] | Freq: Three times a day (TID) | INTRAMUSCULAR | Status: DC
  Administered 2021-12-29 – 2021-12-30 (×2): 5000 [IU] via SUBCUTANEOUS
  Filled 2021-12-29: qty 1

## 2021-12-29 MED ORDER — POLYETHYLENE GLYCOL 3350 17 G PO PACK
17.0000 g | PACK | Freq: Every day | ORAL | Status: DC | PRN
  Administered 2021-12-30: 17 g via ORAL
  Filled 2021-12-29: qty 1

## 2021-12-29 MED ORDER — ONDANSETRON HCL 4 MG PO TABS: 4.0000 mg | ORAL_TABLET | Freq: Four times a day (QID) | ORAL | Status: DC | PRN

## 2021-12-29 MED ORDER — ACETAMINOPHEN 650 MG RE SUPP: 650.0000 mg | Freq: Four times a day (QID) | RECTAL | Status: DC | PRN

## 2021-12-29 MED ORDER — ONDANSETRON HCL 4 MG/2ML IJ SOLN: 4.0000 mg | Freq: Four times a day (QID) | INTRAMUSCULAR | Status: DC | PRN

## 2021-12-29 MED ORDER — IPRATROPIUM-ALBUTEROL 0.5-2.5 (3) MG/3ML IN SOLN
3.0000 mL | Freq: Once | RESPIRATORY_TRACT | Status: AC
  Administered 2021-12-29: 3 mL via RESPIRATORY_TRACT
  Filled 2021-12-29: qty 3

## 2021-12-29 MED ORDER — HYDROCODONE-ACETAMINOPHEN 10-325 MG PO TABS: 1.0000 | ORAL_TABLET | Freq: Two times a day (BID) | ORAL | Status: DC | PRN

## 2021-12-29 MED ORDER — PREDNISONE 5 MG PO TABS: 5.0000 mg | ORAL_TABLET | Freq: Every day | ORAL | Status: DC

## 2021-12-29 NOTE — H&P (Signed)
History and Physical    Alexander [name] VQM:[id number] DOB: 24-Aug-1950 DOA: 12/29/2021  PCP: Lemmie Evens, MD   Patient coming from: Home  I have personally briefly reviewed patient's old medical records in [place]  Chief Complaint: Difficulty breathing, Cough  HPI: Alexander [name] is a 71 y.o. male with medical history significant for lung cancer, oropharyngeal cancer, prostate cancer. Patient went to his oncologist office, he was found to be hypoxic O2 sats down to 88% on room air he was referred to the ED. Reports over the past 3 days, he has had difficulty breathing, worse with exertion, and productive cough.  At baseline he had dyspnea of the symptoms.  He quit smoking about 6 years ago.  Denies prior diagnosis of COPD.  Denies chest pain.  No leg swelling.  No fevers no chills.  ED Course: O2 sats on room air at rest in the mid 90s, but with ambulation sats dropped to 88%, and he became tachycardic up to 140s. WBC 10.6.  Creatinine improved compared to baseline at 1.98.  Magnesium 1.8.  TSH elevated at 5.5.  Lactic acid 1.2.  COVID test negative.  Two-view chest x-ray shows hyperexpansion of the lung, and ill-defined rounded density left upper lobe seen on prior CT.   IV ceftriaxone and azithromycin given for possible pneumonia.   Hospitalist  to admit for acute hypoxic respiratory failure.  Review of Systems: As per HPI all other systems reviewed and negative.  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 3 weeks ago.- Dr. Oneal [facility] [place], [place].   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   SCCa of Left BOT    Past Surgical History:  Procedure Laterality Date   BIOPSY  01/15/2018   Procedure: BIOPSY;  Surgeon: Danie Binder, MD;  [facility];  Service:  Endoscopy;;  gastric   COLONOSCOPY N/A 03/13/2016   Procedure: COLONOSCOPY;  Surgeon: Danie Binder, MD;  [facility];  Service: Endoscopy;  Laterality: N/A;  2:15 PM   CYSTOSCOPY N/A 08/29/2021   Procedure: CYSTOSCOPY;  Surgeon: Cleon Gustin, MD;  Location: [facility];  Service: Urology;  Laterality: N/A;   CYSTOSCOPY WITH INSERTION OF UROLIFT N/A 06/27/2021   Procedure: CYSTOSCOPY WITH INSERTION OF UROLIFT;  Surgeon: Cleon Gustin, MD;  Location: [facility];  Service: Urology;  Laterality: N/A;   ESOPHAGOGASTRODUODENOSCOPY (EGD) WITH PROPOFOL N/A 08/17/2014   Procedure: ESOPHAGOGASTRODUODENOSCOPY (EGD) WITH PROPOFOL (procedure #1);  Surgeon: Aviva Signs Md, MD;  Location: [facility];  Service: General;  Laterality: N/A;   ESOPHAGOGASTRODUODENOSCOPY (EGD) WITH PROPOFOL N/A 01/15/2018   Procedure: ESOPHAGOGASTRODUODENOSCOPY (EGD) WITH PROPOFOL;  Surgeon: Danie Binder, MD;  [facility];  Service: Endoscopy;  Laterality: N/A;  9:30am   MULTIPLE EXTRACTIONS WITH ALVEOLOPLASTY N/A 08/12/2014   Procedure: Extraction of tooth #'s 6,17,22,23,24,25,26,27 with alveoloplasty;  Surgeon: Lenn Cal, DDS;  Location: [facility];  Service: Oral Surgery;  Laterality: N/A;   PANENDOSCOPY N/A 08/06/2014   Procedure: PANENDOSCOPY WITH BIOPSY;  Surgeon: Leta Baptist, MD;  Location: [facility];  Service: ENT;  Laterality: N/A;   PEG PLACEMENT Left 08/17/14   PEG PLACEMENT N/A 08/17/2014   Procedure: PERCUTANEOUS ENDOSCOPIC GASTROSTOMY (PEG) PLACEMENT (procedure #1);  Surgeon: Aviva Signs Md, MD;  Location:  [facility];  Service: General;  Laterality: N/A;   PORT-A-CATH REMOVAL Right 07/17/2016   Procedure: MINOR REMOVAL PORT-A-CATH;  Surgeon: Aviva Signs, MD;  Location[facility];  Service: General;  Laterality: Right;   PORTACATH PLACEMENT Right 08/17/14   PORTACATH PLACEMENT Right 08/17/2014   Procedure: INSERTION PORT-A-CATH (procedure #2);  Surgeon: Aviva Signs Md, MD;  Location: [facility];  Service: General;  Laterality: Right;   PORTACATH PLACEMENT Left 04/26/2018   Procedure: INSERTION PORT-A-CATH (attached catheter in left subclavian);  Surgeon: Aviva Signs, MD;  Location[facility];  Service: General;  Laterality: Left;   SAVORY DILATION N/A 01/15/2018   Procedure: SAVORY DILATION;  Surgeon: Danie Binder, MD;  [facility];  Service: Endoscopy;  Laterality: N/A;   TRANSURETHRAL RESECTION OF PROSTATE N/A 08/29/2021   Procedure: TRANSURETHRAL RESECTION OF THE PROSTATE (TURP);  Surgeon: Cleon Gustin, MD;  Location[facility];  Service: Urology;  Laterality: N/A;   VIDEO BRONCHOSCOPY WITH ENDOBRONCHIAL ULTRASOUND N/A 04/15/2018   Procedure: VIDEO BRONCHOSCOPY WITH ENDOBRONCHIAL ULTRASOUND;  Surgeon: Melrose Nakayama, MD;  Location: [facility];  Service: Thoracic;  Laterality: N/A;     reports that he quit smoking about 7 years ago. His smoking use included cigarettes. He has a 15.00 pack-year smoking history. He has never used smokeless tobacco. He reports that he does not currently use alcohol. He reports that he does not use drugs.  No Known Allergies  Family History  Problem Relation Age of Onset   Colon cancer Neg Hx    Gastric cancer Neg Hx    Esophageal cancer Neg Hx     Prior to Admission medications   Medication Sig Start Date End Date Taking? Authorizing Provider  abiraterone acetate (ZYTIGA) 250 MG tablet Take 4 tablets (1,000 mg total) by mouth daily. Take on an empty stomach 1 hour before or 2 hours after a meal 10/27/21  Yes Derek Jack, MD  Gemcitabine HCl (GEMZAR IV) Inject into the vein once a week. Days 1 & 8 q 21 days   Yes [provider]  HYDROcodone-acetaminophen (NORCO) 10-325 MG tablet TAKE 1 TABLET EVERY 12 HOURS AS NEEDED. 12/02/21  Yes Pennington, Rebekah M, PA-C  levothyroxine (SYNTHROID) 88 MCG tablet TAKE ONE TABLET BY MOUTH DAILY BEFORE BREAKFAST 10/03/21  Yes Derek Jack, MD  naproxen sodium (ALEVE) 220  MG tablet Take 220 mg by mouth daily as needed (pain.).   Yes [provider]  omeprazole (PRILOSEC) 20 MG capsule Take 1 capsule (20 mg total) by mouth daily. 10/03/21  Yes Derek Jack, MD  predniSONE (DELTASONE) 5 MG tablet Take 1 tablet (5 mg total) by mouth daily with breakfast. 10/27/21  Yes Derek Jack, MD  sildenafil (VIAGRA) 100 MG tablet Take 1 tablet (100 mg total) by mouth as needed for erectile dysfunction. 10/21/21  Yes McKenzie, Candee Furbish, MD  tamsulosin (FLOMAX) 0.4 MG CAPS capsule Take 1 capsule (0.4 mg total) by mouth 2 (two) times daily. 07/01/21  Yes Summerlin, Berneice Heinrich, PA-C  prochlorperazine (COMPAZINE) 10 MG tablet Take 1 tablet (10 mg total) by mouth every 6 (six) hours as needed (Nausea or vomiting). Patient not taking: Reported on 11/13/2018 05/01/18 12/04/18  Zoila Shutter, MD    Physical Exam: Vitals:   12/29/21 1815 12/29/21 1845 12/29/21 1900 12/29/21 1902  BP:   126/86   Pulse: 92 95 95   Resp: 17 14 16    Temp:    100 F (37.8 C)  TempSrc:  Oral  SpO2: 90% 94% 97%   Weight:      Height:        Constitutional: Thin,  calm, comfortable Vitals:   12/29/21 1815 12/29/21 1845 12/29/21 1900 12/29/21 1902  BP:   126/86   Pulse: 92 95 95   Resp: 17 14 16    Temp:    100 F (37.8 C)  TempSrc:    Oral  SpO2: 90% 94% 97%   Weight:      Height:       Eyes: PERRL, lids and conjunctivae normal ENMT: Mucous membranes are moist.   Neck: normal, supple, no masses, no thyromegaly Respiratory: Diffuse expiratory wheezing and expiratory rhonchi,  Normal respiratory effort. No accessory muscle use.  Cardiovascular: Regular rate and rhythm, no murmurs / rubs / gallops. No extremity edema.  Abdomen: no tenderness, no masses palpated. No hepatosplenomegaly. Bowel sounds positive.  Musculoskeletal: no clubbing / cyanosis. No joint deformity upper and lower extremities.  Skin: no rashes, lesions, ulcers. No induration Neurologic: No apparent  cranial nerve abnormality moving extremities spontaneously. Psychiatric: Normal judgment and insight. Alert and oriented x 3. Normal mood.   Labs on Admission: I have personally reviewed following labs and imaging studies  CBC: Recent Labs  Lab 12/29/21 1345  WBC 10.6*  NEUTROABS 9.2*  HGB 12.6*  HCT 38.8*  MCV 96.0  PLT 782   Basic Metabolic Panel: Recent Labs  Lab 12/29/21 1345 12/29/21 1434  NA 137 138  K 4.0 4.1  CL 103 103  CO2 22 22  GLUCOSE 85 87  BUN 36* 37*  CREATININE 1.98* 1.96*  CALCIUM 9.0 9.0  MG 1.8  --    Liver Function Tests: Recent Labs  Lab 12/29/21 1345 12/29/21 1434  AST 20 19  ALT 16 16  ALKPHOS 92 92  BILITOT 1.2 1.1  PROT 7.1 7.1  ALBUMIN 3.3* 3.3*   Coagulation Profile: Recent Labs  Lab 12/29/21 1434  INR 1.1   Thyroid Function Tests: Recent Labs    12/29/21 1345  TSH 5.524*   Urine analysis:    Component Value Date/Time   COLORURINE BROWN (A) 08/20/2021 1258   APPEARANCEUR Clear 10/21/2021 1123   LABSPEC 1.006 08/20/2021 1258   PHURINE 6.0 08/20/2021 1258   GLUCOSEU Negative 10/21/2021 1123   HGBUR LARGE (A) 08/20/2021 1258   BILIRUBINUR Negative 10/21/2021 1123   KETONESUR NEGATIVE 08/20/2021 1258   PROTEINUR 2+ (A) 10/21/2021 1123   PROTEINUR 100 (A) 08/20/2021 1258   NITRITE Positive (A) 10/21/2021 1123   NITRITE POSITIVE (A) 08/20/2021 1258   LEUKOCYTESUR 2+ (A) 10/21/2021 1123   LEUKOCYTESUR MODERATE (A) 08/20/2021 1258    Radiological Exams on Admission: DG Chest 2 View  Result Date: 12/29/2021 CLINICAL DATA:  Shortness of breath. EXAM: CHEST - 2 VIEW COMPARISON:  April 26, 2018.  October 03, 2021. FINDINGS: The heart size and mediastinal contours are within normal limits. Hyperexpansion of the lungs is noted. Left subclavian Port-A-Cath is noted with distal tip in expected position of cavoatrial junction. There is in ill-defined density projected over a lower thoracic vertebral body on lateral projection only  which corresponds in location to left lower lobe pulmonary nodule noted on prior CT scan. Bony thorax is unremarkable. IMPRESSION: Hyperexpansion of the lungs. Ill-defined rounded density seen projected over lower thoracic vertebral body on lateral projection only; this corresponds to left lower lobe pulmonary nodule and probable metastatic lesion seen on prior chest CT. No other definite abnormality is seen involving the  lungs. Electronically Signed   By: Marijo Conception M.D.   On: 12/29/2021 15:19    EKG: none.   Assessment/Plan Principal Problem:   Acute respiratory failure with hypoxia (HCC) Active Problems:   Squamous cell lung cancer, left (HCC)   Acute bronchitis   Oropharyngeal carcinoma (HCC)   BPH with urinary obstruction   Prostate cancer (HCC)   CKD (chronic kidney disease), stage III (HCC)  Assessment and Plan: * Acute respiratory failure with hypoxia (HCC) O2 sats dropped to 88% on room air with ambulation.  Sats in the mid 90s at rest.  Likely from acute bronchitis. -Supplemental O2  Squamous cell lung cancer, left ([place]) Follows with Dr. Delton Coombes, has advanced squamous cell carcinoma of the left lung.  Completed 6 cycles of chemotherapy with progression.  Currently on gemcitabine injections. -Hold low-dose prednisone 5 mg daily while on IV steroids  Acute bronchitis Acute bronchitis with acute hypoxic respiratory failure.  With diffuse rhonchi/wheezing.  Chest x-ray clear but shows hyperinflation of lungs, and metastatic lesion seen on prior CT.  History of cigarettes smoking, quit about 6 to 7 years ago.  Denies prior diagnosis of COPD.  No respiratory symptoms prior to today.  COVID test negative. -DuoNebs as needed and scheduled -Continue with IV azithromycin only -Mucolytic's -IV Solu-Medrol 80mg , Continue 60 Twice Daily  CKD (chronic kidney disease), stage III (HCC) CKD stage IIIb.  Creatinine 1.98 improved compared to prior.  Prostate cancer [facility]) Follows  with Dr. Delton Coombes. -Continue abiraterone  Oropharyngeal carcinoma (HCC) Chemoradiation therapy from 09/01/2014 through 09/22/2014 with 2 cycles of high-dose cisplatin    DVT prophylaxis: Heparin Code Status: FULL Family Communication: None at bedside Disposition Plan: ~ 1 -2 days Consults called: None  Admission status:  Obs tel    Author: Bethena Roys, MD 12/29/2021 8:38 PM  For on call review [email or web address].

## 2021-12-29 NOTE — Assessment & Plan Note (Signed)
CKD stage IIIb.  Creatinine 1.98 improved compared to prior.

## 2021-12-29 NOTE — Assessment & Plan Note (Addendum)
Follows with Dr. Delton Coombes, has advanced squamous cell carcinoma of the left lung.  Completed 6 cycles of chemotherapy with progression.  Currently on gemcitabine injections. -Hold low-dose prednisone 5 mg daily while on IV steroids

## 2021-12-29 NOTE — Progress Notes (Signed)
Patient presents to the [facility] today for PF/lab, patient presents with shortness of breath, oxygen sats dropped to 88% on RA and patient states that he has not felt good for over a week. Vital signs taken. Patient assessed by Dr. Delton Coombes. Patient sent to the ED for further evaluation. Report called and given to [facility], ED Charge RN. Patient to be transported to ED Room 8.

## 2021-12-29 NOTE — Assessment & Plan Note (Signed)
Chemoradiation therapy from 09/01/2014 through 09/22/2014 with 2 cycles of high-dose cisplatin

## 2021-12-29 NOTE — Assessment & Plan Note (Signed)
Acute bronchitis with acute hypoxic respiratory failure.  With diffuse rhonchi/wheezing.  Chest x-ray clear but shows hyperinflation of lungs, and metastatic lesion seen on prior CT.  History of cigarettes smoking, quit about 6 to 7 years ago.  Denies prior diagnosis of COPD.  No respiratory symptoms prior to today.  COVID test negative. -DuoNebs as needed and scheduled -Continue with IV azithromycin only -Mucolytic's -IV Solu-Medrol 80mg , Continue 60 Twice Daily

## 2021-12-29 NOTE — Assessment & Plan Note (Signed)
Follows with Dr. Delton Coombes. -Continue abiraterone

## 2021-12-29 NOTE — ED Notes (Signed)
Respiratory called for neb tx

## 2021-12-29 NOTE — ED Triage Notes (Addendum)
Pt arrives to ED from cancer center d/t SOB x 3 days, O2 97% on RA during triage, pt states productive cough, denies CP/fevers. A&Ox4, NAD.

## 2021-12-29 NOTE — ED Provider Notes (Addendum)
[facility] EMERGENCY DEPARTMENT Provider Note   CSN: [id number] Arrival date & time: 12/29/21  1400     History Chief Complaint  Patient presents with   Shortness of Breath    Alexander [name] is a 71 y.o. 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  Patient was sent from cancer center.  He does endorse intermittent hot flashes but denies any nausea, vomiting, diarrhea.   Shortness of Breath      Home Medications Prior to Admission medications   Medication Sig Start Date End Date Taking? Authorizing Provider  abiraterone acetate (ZYTIGA) 250 MG tablet Take 4 tablets (1,000 mg total) by mouth daily. Take on an empty stomach 1 hour before or 2 hours after a meal 10/27/21  Yes Derek Jack, MD  Gemcitabine HCl (GEMZAR IV) Inject into the vein once a week. Days 1 & 8 q 21 days   Yes [provider]  HYDROcodone-acetaminophen (NORCO) 10-325 MG tablet TAKE 1 TABLET EVERY 12 HOURS AS NEEDED. 12/02/21  Yes Pennington, Rebekah M, PA-C  levothyroxine (SYNTHROID) 88 MCG tablet TAKE ONE TABLET BY MOUTH DAILY BEFORE BREAKFAST 10/03/21  Yes Derek Jack, MD  naproxen sodium (ALEVE) 220 MG tablet Take 220 mg by mouth daily as needed (pain.).   Yes [provider]  omeprazole (PRILOSEC) 20 MG capsule Take 1 capsule (20 mg total) by mouth daily. 10/03/21  Yes Derek Jack, MD  predniSONE (DELTASONE) 5 MG tablet Take 1 tablet (5 mg total) by mouth daily with breakfast. 10/27/21  Yes Derek Jack, MD  sildenafil (VIAGRA) 100 MG tablet Take 1 tablet (100 mg total) by mouth as needed for erectile dysfunction. 10/21/21  Yes McKenzie, Candee Furbish, MD  tamsulosin (FLOMAX) 0.4 MG CAPS capsule Take 1 capsule (0.4 mg total) by mouth 2 (two) times daily. 07/01/21  Yes [place]lin, Berneice Heinrich, PA-C   prochlorperazine (COMPAZINE) 10 MG tablet Take 1 tablet (10 mg total) by mouth every 6 (six) hours as needed (Nausea or vomiting). Patient not taking: Reported on 11/13/2018 05/01/18 12/04/18  Zoila Shutter, MD      Allergies    Patient has no known allergies.    Review of Systems   Review of Systems  Respiratory:  Positive for shortness of breath.   All other systems reviewed and are negative.   Physical Exam Updated Vital Signs BP 126/86   Pulse 95   Temp 100 F (37.8 C) (Oral)   Resp 16   Ht 6\' 1"  (1.854 m)   Wt 59 kg   SpO2 97%   BMI 17.15 kg/m  Physical Exam Vitals and nursing note reviewed.  Constitutional:      General: He is not in acute distress.    Appearance: Normal appearance.  HENT:     Head: Normocephalic and atraumatic.  Eyes:     General:        Right eye: No discharge.        Left eye: No discharge.  Cardiovascular:     Comments: Regular rate and rhythm.  S1/S2 are distinct without any evidence of murmur, rubs, or gallops.  Radial pulses are 2+ bilaterally.  Dorsalis pedis pulses are 2+ bilaterally.  No evidence of pedal edema. Pulmonary:     Effort: Pulmonary effort is normal. No tachypnea.     Breath sounds: Wheezing and rhonchi present.  Abdominal:  General: Abdomen is flat. Bowel sounds are normal. There is no distension.     Tenderness: There is no abdominal tenderness. There is no guarding or rebound.  Musculoskeletal:        General: Normal range of motion.     Cervical back: Neck supple.  Skin:    General: Skin is warm and dry.     Findings: No rash.  Neurological:     General: No focal deficit present.     Mental Status: He is alert.  Psychiatric:        Mood and Affect: Mood normal.        Behavior: Behavior normal.     ED Results / Procedures / Treatments   Labs (all labs ordered are listed, but only abnormal results are displayed) Labs Reviewed  COMPREHENSIVE METABOLIC PANEL - Abnormal; Notable for the following components:       Result Value   BUN 37 (*)    Creatinine, Ser 1.96 (*)    Albumin 3.3 (*)    GFR, Estimated 36 (*)    All other components within normal limits  CULTURE, BLOOD (ROUTINE X 2)  CULTURE, BLOOD (ROUTINE X 2)  RESP PANEL BY RT-PCR (FLU A&B, COVID) ARPGX2  LACTIC ACID, PLASMA  LACTIC ACID, PLASMA  PROTIME-INR  CBC WITH DIFFERENTIAL/PLATELET  URINALYSIS, ROUTINE W REFLEX MICROSCOPIC    EKG None  Radiology DG Chest 2 View  Result Date: 12/29/2021 CLINICAL DATA:  Shortness of breath. EXAM: CHEST - 2 VIEW COMPARISON:  April 26, 2018.  October 03, 2021. FINDINGS: The heart size and mediastinal contours are within normal limits. Hyperexpansion of the lungs is noted. Left subclavian Port-A-Cath is noted with distal tip in expected position of cavoatrial junction. There is in ill-defined density projected over a lower thoracic vertebral body on lateral projection only which corresponds in location to left lower lobe pulmonary nodule noted on prior CT scan. Bony thorax is unremarkable. IMPRESSION: Hyperexpansion of the lungs. Ill-defined rounded density seen projected over lower thoracic vertebral body on lateral projection only; this corresponds to left lower lobe pulmonary nodule and probable metastatic lesion seen on prior chest CT. No other definite abnormality is seen involving the lungs. Electronically Signed   By: Marijo Conception M.D.   On: 12/29/2021 15:19    Procedures .Critical Care  Performed by: Hendricks Limes, PA-C Authorized by: Hendricks Limes, PA-C   Critical care provider statement:    Critical care time (minutes):  35   Critical care time was exclusive of:  Separately billable procedures and treating other patients   Critical care was necessary to treat or prevent imminent or life-threatening deterioration of the following conditions:  Respiratory failure   Critical care was time spent personally by me on the following activities:  Blood draw for specimens, development of  treatment plan with patient or surrogate, discussions with consultants, discussions with primary provider, evaluation of patient's response to treatment, ordering and performing treatments and interventions, ordering and review of laboratory studies, ordering and review of radiographic studies, pulse oximetry, re-evaluation of patient's condition and review of old charts     Medications Ordered in ED Medications  cefTRIAXone (ROCEPHIN) 1 g in sodium chloride 0.9 % 100 mL IVPB (1 g Intravenous New Bag/Given 12/29/21 1901)  azithromycin (ZITHROMAX) 500 mg in sodium chloride 0.9 % 250 mL IVPB (has no administration in time range)  ipratropium-albuterol (DUONEB) 0.5-2.5 (3) MG/3ML nebulizer solution 3 mL (3 mLs Nebulization Given 12/29/21 1639)  ED Course/ Medical Decision Making/ A&P Clinical Course as of 12/29/21 1910  Thu Dec 29, 2021  1833 CBC which was done in the infusion center just prior to his arrival shows a mild leukocytosis and slight anemia. [CF]  4944 Comprehensive metabolic panel(!) CMP shows elevated creatinine but seems to be at the patient's baseline. [CF]  1834 Lactic acid, plasma Initial lactic is normal. [CF]  1835 Protime-INR Normal. [CF]  1835 DG Chest 2 View There is no clear signs of pneumonia but there is evidence of metastatic disease in the lung.  I personally ordered and interpreted this study and I do agree with the radiologist interpretation. [CF]  9675 Patient ambulated to the bathroom and became very tachypneic and had O2 sats dropped down into the low 80s and heart rate that jumped up to the 140s.  He was immediately brought back to bed and put on 3 L of oxygen. [CF]  1909 I spoke with Dr. Denton Brick with [facility] hospitalist who agrees to admit the patient.  [CF]    Clinical Course User Index [CF] Hendricks Limes, PA-C                           Medical Decision Making HANDSOME [name] is a 71 y.o. male patient who presents to the emergency department today  for further evaluation of 3-day history of productive cough, generalized weakness, and fatigue.  Given his history of cancer and immunocompromise status I will get sepsis work-up on him to evaluate for possible pneumonia.  Patient is on oxygen in the room but no acute distress.  He is coughing during exam.  There is evidence of a mild leukocytosis.  Chest x-ray does not show clear signs of pneumonia but there is some clinical evidence of metastatic disease.  Patient is currently on 3 L of oxygen and mid 90s.  Upon ambulation he immediately dropped into the low 80s and became quite tachypneic and tachycardic.  Given that the patient is not normally on oxygen and this is a new oxygen requirement for him I do feel the patient would likely benefit from further evaluation in the hospital for further management.  I will speak to the hospitalist to get him admitted.  Amount and/or Complexity of Data Reviewed External Data Reviewed: labs and notes.    Details: Highlighted in ED course, MDM, and HPI Labs: ordered. Decision-making details documented in ED Course. Radiology: ordered. Decision-making details documented in ED Course.  Risk Prescription drug management. Decision regarding hospitalization.   Final Clinical Impression(s) / ED Diagnoses Final diagnoses:  Shortness of breath  Acute respiratory failure with hypoxia [facility])    Rx / DC Orders ED Discharge Orders     None         Hendricks Limes, PA-C 12/29/21 1909    Hendricks Limes, PA-C 12/29/21 1910    Noemi Chapel, MD 12/30/21 1242

## 2021-12-29 NOTE — Assessment & Plan Note (Signed)
O2 sats dropped to 88% on room air with ambulation.  Sats in the mid 90s at rest.  Likely from acute bronchitis. -Supplemental O2

## 2021-12-29 NOTE — ED Provider Notes (Signed)
Patient is a 71 year old male being treated for metastatic cancer, presents with some shortness of breath for a few days.  He has had some productive coughing but has not had any fevers, on my exam the patient is [place] tachypneic, has diffuse expiratory wheezing and rhonchi with occasional rales.  Vital signs reflect no tachycardia or fever here, no hypotension.  Work-up for infection to ensue, may need to be admitted if signs of sepsis  Medical screening examination/treatment/procedure(s) were conducted as a shared visit with non-physician practitioner(s) and myself.  I personally evaluated the patient during the encounter.  Clinical Impression:   Final diagnoses:  None         Noemi Chapel, MD 12/30/21 2231

## 2021-12-30 ENCOUNTER — Encounter: Payer: Self-pay | Admitting: Hematology

## 2021-12-30 DIAGNOSIS — J9601 Acute respiratory failure with hypoxia: Secondary | ICD-10-CM | POA: Diagnosis not present

## 2021-12-30 LAB — CBC
HCT: 34 % — ABNORMAL LOW (ref 39.0–52.0)
Hemoglobin: 11.2 g/dL — ABNORMAL LOW (ref 13.0–17.0)
MCH: 31.2 pg (ref 26.0–34.0)
MCHC: 32.9 g/dL (ref 30.0–36.0)
MCV: 94.7 fL (ref 80.0–100.0)
Platelets: 184 10*3/uL (ref 150–400)
RBC: 3.59 MIL/uL — ABNORMAL LOW (ref 4.22–5.81)
RDW: 14.3 % (ref 11.5–15.5)
WBC: 10.3 10*3/uL (ref 4.0–10.5)
nRBC: 0 % (ref 0.0–0.2)

## 2021-12-30 MED ORDER — ALBUTEROL SULFATE HFA 108 (90 BASE) MCG/ACT IN AERS: 2.0000 | INHALATION_SPRAY | Freq: Four times a day (QID) | RESPIRATORY_TRACT | 2 refills | Status: DC | PRN

## 2021-12-30 MED ORDER — AZITHROMYCIN 500 MG PO TABS: 500.0000 mg | ORAL_TABLET | Freq: Every day | ORAL | 0 refills | Status: AC

## 2021-12-30 MED ORDER — CHLORHEXIDINE GLUCONATE CLOTH 2 % EX PADS
6.0000 | MEDICATED_PAD | Freq: Every day | CUTANEOUS | Status: DC
  Administered 2021-12-30: 6 via TOPICAL

## 2021-12-30 MED ORDER — HEPARIN SOD (PORK) LOCK FLUSH 100 UNIT/ML IV SOLN
500.0000 [IU] | Freq: Once | INTRAVENOUS | Status: AC
  Administered 2021-12-30: 500 [IU] via INTRAVENOUS
  Filled 2021-12-30: qty 5

## 2021-12-30 MED ORDER — GUAIFENESIN-DM 100-10 MG/5ML PO SYRP: 10.0000 mL | ORAL_SOLUTION | Freq: Three times a day (TID) | ORAL | 0 refills | Status: DC | PRN

## 2021-12-30 MED ORDER — PREDNISONE 20 MG PO TABS: 40.0000 mg | ORAL_TABLET | Freq: Every day | ORAL | 0 refills | Status: AC

## 2021-12-30 NOTE — Discharge Summary (Signed)
Physician Discharge Summary  Alexander [name] KVQ:[id number] DOB: Sep 06, 1950 DOA: 12/29/2021  PCP: Lemmie Evens, MD  Admit date: 12/29/2021 Discharge date: 12/30/2021  Admitted From: home Disposition:  home  Recommendations for Outpatient Follow-up:  Follow up with PCP in 1-2 weeks  Home Health: none Equipment/Devices: none  Discharge Condition: stable CODE STATUS: Full code  HPI: Per admitting MD, Alexander [name] is a 71 y.o. male with medical history significant for lung cancer, oropharyngeal cancer, prostate cancer. Patient went to his oncologist office, he was found to be hypoxic O2 sats down to 88% on room air he was referred to the ED. Reports over the past 3 days, he has had difficulty breathing, worse with exertion, and productive cough.  At baseline he had dyspnea of the symptoms.  He quit smoking about 6 years ago.  Denies prior diagnosis of COPD.  Denies chest pain.  No leg swelling.  No fevers no chills.  Hospital Course / Discharge diagnoses: Principal Problem:   Acute respiratory failure with hypoxia (HCC) Active Problems:   Squamous cell lung cancer, left (HCC)   Acute bronchitis   Oropharyngeal carcinoma (HCC)   BPH with urinary obstruction   Prostate cancer ([place])   CKD (chronic kidney disease), stage III (HCC)  Principal problem Acute hypoxic respiratory failure due to COPD exacerbation, acute bronchitis-patient was admitted to the hospital with acute bronchitis, hypoxia requiring supplemental oxygen, as well as wheezing.  He was placed on steroids, antibiotics, breathing treatments with improvement in his symptoms.  He has been weaned off to room air, able to ambulate in the hallway maintaining O2 sats above 90%.  He has a longstanding history of smoking, suspect underlying COPD.  With improvement, he will be discharged home in stable condition, to continue antibiotics and steroids for a few more days.  He was also prescribed albuterol  Active  problems Squamous cell lung cancer-follows with oncology as an outpatient CKD 3B-baseline creatinine 2-2.3, creatinine at baseline Prostate cancer-follows with oncology as an outpatient Oropharyngeal carcinoma-status posttreatment in 2016  Sepsis ruled out   Discharge Instructions   Allergies as of 12/30/2021   No Known Allergies      Medication List     TAKE these medications    abiraterone acetate 250 MG tablet Commonly known as: ZYTIGA Take 4 tablets (1,000 mg total) by mouth daily. Take on an empty stomach 1 hour before or 2 hours after a meal   albuterol 108 (90 Base) MCG/ACT inhaler Commonly known as: VENTOLIN HFA Inhale 2 puffs into the lungs every 6 (six) hours as needed for wheezing or shortness of breath.   azithromycin 500 MG tablet Commonly known as: Zithromax Take 1 tablet (500 mg total) by mouth daily for 4 days.   GEMZAR IV Inject into the vein once a week. Days 1 & 8 q 21 days   guaiFENesin-dextromethorphan 100-10 MG/5ML syrup Commonly known as: ROBITUSSIN DM Take 10 mLs by mouth every 8 (eight) hours as needed for cough.   HYDROcodone-acetaminophen 10-325 MG tablet Commonly known as: NORCO TAKE 1 TABLET EVERY 12 HOURS AS NEEDED.   levothyroxine 88 MCG tablet Commonly known as: SYNTHROID TAKE ONE TABLET BY MOUTH DAILY BEFORE BREAKFAST   naproxen sodium 220 MG tablet Commonly known as: ALEVE Take 220 mg by mouth daily as needed (pain.).   omeprazole 20 MG capsule Commonly known as: PRILOSEC Take 1 capsule (20 mg total) by mouth daily.   predniSONE 20 MG tablet Commonly known as: DELTASONE Take 2  tablets (40 mg total) by mouth daily with breakfast for 5 days. Start taking on: December 31, 2021 What changed:  medication strength how much to take   sildenafil 100 MG tablet Commonly known as: VIAGRA Take 1 tablet (100 mg total) by mouth as needed for erectile dysfunction.   tamsulosin 0.4 MG Caps capsule Commonly known as: FLOMAX Take 1  capsule (0.4 mg total) by mouth 2 (two) times daily.         Consultations: none  Procedures/Studies:  DG Chest 2 View  Result Date: 12/29/2021 CLINICAL DATA:  Shortness of breath. EXAM: CHEST - 2 VIEW COMPARISON:  April 26, 2018.  October 03, 2021. FINDINGS: The heart size and mediastinal contours are within normal limits. Hyperexpansion of the lungs is noted. Left subclavian Port-A-Cath is noted with distal tip in expected position of cavoatrial junction. There is in ill-defined density projected over a lower thoracic vertebral body on lateral projection only which corresponds in location to left lower lobe pulmonary nodule noted on prior CT scan. Bony thorax is unremarkable. IMPRESSION: Hyperexpansion of the lungs. Ill-defined rounded density seen projected over lower thoracic vertebral body on lateral projection only; this corresponds to left lower lobe pulmonary nodule and probable metastatic lesion seen on prior chest CT. No other definite abnormality is seen involving the lungs. Electronically Signed   By: Marijo Conception M.D.   On: 12/29/2021 15:19     Subjective: - no chest pain, shortness of breath, no abdominal pain, nausea or vomiting.   Discharge Exam: BP 96/61   Pulse 75   Temp 98.2 F (36.8 C)   Resp 16   Ht 6\' 1"  (1.854 m)   Wt 53.8 kg   SpO2 (!) 88%   BMI 15.63 kg/m   General: Pt is alert, awake, not in acute distress Cardiovascular: RRR, S1/S2 +, no rubs, no gallops Respiratory: CTA bilaterally, no wheezing, no rhonchi Abdominal: Soft, NT, ND, bowel sounds + Extremities: no edema, no cyanosis    The results of significant diagnostics from this hospitalization (including imaging, microbiology, ancillary and laboratory) are listed below for reference.     Microbiology: Recent Results (from the past 240 hour(s))  Culture, blood (Routine x 2)     Status: None (Preliminary result)   Collection Time: 12/29/21  2:34 PM   Specimen: BLOOD  Result Value Ref  Range Status   Specimen Description BLOOD RIGHT ARM  Final   Special Requests   Final    BOTTLES DRAWN AEROBIC AND ANAEROBIC Blood Culture adequate volume   Culture   Final    NO GROWTH < 24 HOURS Performed at [facility], [address], [place], [place] [address]    Report Status PENDING  Incomplete  Culture, blood (Routine x 2)     Status: None (Preliminary result)   Collection Time: 12/29/21  2:39 PM   Specimen: BLOOD  Result Value Ref Range Status   Specimen Description BLOOD LEFT ARM  Final   Special Requests   Final    BOTTLES DRAWN AEROBIC AND ANAEROBIC Blood Culture results may not be optimal due to an inadequate volume of blood received in culture bottles   Culture   Final    NO GROWTH < 24 HOURS Performed at [facility], [address], [place],  [address]    Report Status PENDING  Incomplete  Resp Panel by RT-PCR (Flu A&B, Covid) Anterior Nasal Swab     Status: None   Collection Time: 12/29/21  4:21 PM  Specimen: Anterior Nasal Swab  Result Value Ref Range Status   SARS Coronavirus 2 by RT PCR NEGATIVE NEGATIVE Final    Comment: (NOTE) SARS-CoV-2 target nucleic acids are NOT DETECTED.  The SARS-CoV-2 RNA is generally detectable in upper respiratory specimens during the acute phase of infection. The lowest concentration of SARS-CoV-2 viral copies this assay can detect is 138 copies/mL. A negative result does not preclude SARS-Cov-2 infection and should not be used as the sole basis for treatment or other patient management decisions. A negative result may occur with  improper specimen collection/handling, submission of specimen other than nasopharyngeal swab, presence of viral mutation(s) within the areas targeted by this assay, and inadequate number of viral copies(<138 copies/mL). A negative result must be combined with clinical observations, patient history, and epidemiological information. The expected result is Negative.  Fact Sheet for Patients:   EntrepreneurPulse.com.au  Fact Sheet for Healthcare Providers:  IncredibleEmployment.be  This test is no t yet approved or cleared by the [place] FDA and  has been authorized for detection and/or diagnosis of SARS-CoV-2 by FDA under an Emergency Use Authorization (EUA). This EUA will remain  in effect (meaning this test can be used) for the duration of the COVID-19 declaration under Section 564(b)(1) of the Act, 21 U.S.C.section 360bbb-3(b)(1), unless the authorization is terminated  or revoked sooner.       Influenza A by PCR NEGATIVE NEGATIVE Final   Influenza B by PCR NEGATIVE NEGATIVE Final    Comment: (NOTE) The Xpert Xpress SARS-CoV-2/FLU/RSV plus assay is intended as an aid in the diagnosis of influenza from Nasopharyngeal swab specimens and should not be used as a sole basis for treatment. Nasal washings and aspirates are unacceptable for Xpert Xpress SARS-CoV-2/FLU/RSV testing.  Fact Sheet for Patients: EntrepreneurPulse.com.au  Fact Sheet for Healthcare Providers: IncredibleEmployment.be  This test is not yet approved or cleared by the [place] FDA and has been authorized for detection and/or diagnosis of SARS-CoV-2 by FDA under an Emergency Use Authorization (EUA). This EUA will remain in effect (meaning this test can be used) for the duration of the COVID-19 declaration under Section 564(b)(1) of the Act, 21 U.S.C. section 360bbb-3(b)(1), unless the authorization is terminated or revoked.  Performed at [facility], [address], [place], [place] [address]      Labs: Basic Metabolic Panel: Recent Labs  Lab 12/29/21 1345 12/29/21 1434  NA 137 138  K 4.0 4.1  CL 103 103  CO2 22 22  GLUCOSE 85 87  BUN 36* 37*  CREATININE 1.98* 1.96*  CALCIUM 9.0 9.0  MG 1.8  --    Liver Function Tests: Recent Labs  Lab 12/29/21 1345 12/29/21 1434  AST 20 19  ALT 16 16  ALKPHOS  92 92  BILITOT 1.2 1.1  PROT 7.1 7.1  ALBUMIN 3.3* 3.3*   CBC: Recent Labs  Lab 12/29/21 1345 12/30/21 0357  WBC 10.6* 10.3  NEUTROABS 9.2*  --   HGB 12.6* 11.2*  HCT 38.8* 34.0*  MCV 96.0 94.7  PLT 186 184   CBG: No results for input(s): "GLUCAP" in the last 168 hours. Hgb A1c No results for input(s): "HGBA1C" in the last 72 hours. Lipid Profile No results for input(s): "CHOL", "HDL", "LDLCALC", "TRIG", "CHOLHDL", "LDLDIRECT" in the last 72 hours. Thyroid function studies Recent Labs    12/29/21 1345  TSH 5.524*   Urinalysis    Component Value Date/Time   COLORURINE YELLOW 12/29/2021 1940   APPEARANCEUR HAZY (A) 12/29/2021 1940  APPEARANCEUR Clear 10/21/2021 1123   LABSPEC 1.019 12/29/2021 1940   PHURINE 5.0 12/29/2021 1940   GLUCOSEU NEGATIVE 12/29/2021 1940   HGBUR MODERATE (A) 12/29/2021 1940   BILIRUBINUR NEGATIVE 12/29/2021 1940   BILIRUBINUR Negative 10/21/2021 1123   KETONESUR 20 (A) 12/29/2021 1940   PROTEINUR 100 (A) 12/29/2021 1940   NITRITE NEGATIVE 12/29/2021 1940   LEUKOCYTESUR SMALL (A) 12/29/2021 1940    FURTHER DISCHARGE INSTRUCTIONS:   Get Medicines reviewed and adjusted: Please take all your medications with you for your next visit with your Primary MD   Laboratory/radiological data: Please request your Primary MD to go over all hospital tests and procedure/radiological results at the follow up, please ask your Primary MD to get all Hospital records sent to his/her office.   In some cases, they will be blood work, cultures and biopsy results pending at the time of your discharge. Please request that your primary care M.D. goes through all the records of your hospital data and follows up on these results.   Also Note the following: If you experience worsening of your admission symptoms, develop shortness of breath, life threatening emergency, suicidal or homicidal thoughts you must seek medical attention immediately by calling 911 or  calling your MD immediately  if symptoms less severe.   You must read complete instructions/literature along with all the possible adverse reactions/side effects for all the Medicines you take and that have been prescribed to you. Take any new Medicines after you have completely understood and accpet all the possible adverse reactions/side effects.    Do not drive when taking Pain medications or sleeping medications (Benzodaizepines)   Do not take more than prescribed Pain, Sleep and Anxiety Medications. It is not advisable to combine anxiety,sleep and pain medications without talking with your primary care practitioner   Special Instructions: If you have smoked or chewed Tobacco  in the last 2 yrs please stop smoking, stop any regular Alcohol  and or any Recreational drug use.   Wear Seat belts while driving.   Please note: You were cared for by a hospitalist during your hospital stay. Once you are discharged, your primary care physician will handle any further medical issues. Please note that NO REFILLS for any discharge medications will be authorized once you are discharged, as it is imperative that you return to your primary care physician (or establish a relationship with a primary care physician if you do not have one) for your post hospital discharge needs so that they can reassess your need for medications and monitor your lab values.  Time coordinating discharge: 40 minutes  SIGNED:  Marzetta Board, MD, PhD 12/30/2021, 12:30 PM

## 2021-12-30 NOTE — Progress Notes (Signed)
Nsg Discharge Note  Admit Date:  12/29/2021 Discharge date: 12/30/2021   Alexander [name] to be D/C'd Home per MD order.  AVS completed.  Copy for chart, and copy for patient signed, and dated. Patient/caregiver able to verbalize understanding.  Discharge Medication: Allergies as of 12/30/2021   No Known Allergies      Medication List     TAKE these medications    abiraterone acetate 250 MG tablet Commonly known as: ZYTIGA Take 4 tablets (1,000 mg total) by mouth daily. Take on an empty stomach 1 hour before or 2 hours after a meal   albuterol 108 (90 Base) MCG/ACT inhaler Commonly known as: VENTOLIN HFA Inhale 2 puffs into the lungs every 6 (six) hours as needed for wheezing or shortness of breath.   azithromycin 500 MG tablet Commonly known as: Zithromax Take 1 tablet (500 mg total) by mouth daily for 4 days.   GEMZAR IV Inject into the vein once a week. Days 1 & 8 q 21 days   guaiFENesin-dextromethorphan 100-10 MG/5ML syrup Commonly known as: ROBITUSSIN DM Take 10 mLs by mouth every 8 (eight) hours as needed for cough.   HYDROcodone-acetaminophen 10-325 MG tablet Commonly known as: NORCO TAKE 1 TABLET EVERY 12 HOURS AS NEEDED.   levothyroxine 88 MCG tablet Commonly known as: SYNTHROID TAKE ONE TABLET BY MOUTH DAILY BEFORE BREAKFAST   naproxen sodium 220 MG tablet Commonly known as: ALEVE Take 220 mg by mouth daily as needed (pain.).   omeprazole 20 MG capsule Commonly known as: PRILOSEC Take 1 capsule (20 mg total) by mouth daily.   predniSONE 20 MG tablet Commonly known as: DELTASONE Take 2 tablets (40 mg total) by mouth daily with breakfast for 5 days. Start taking on: December 31, 2021 What changed:  medication strength how much to take   sildenafil 100 MG tablet Commonly known as: VIAGRA Take 1 tablet (100 mg total) by mouth as needed for erectile dysfunction.   tamsulosin 0.4 MG Caps capsule Commonly known as: FLOMAX Take 1 capsule (0.4 mg total)  by mouth 2 (two) times daily.        Discharge Assessment: Vitals:   12/30/21 1232 12/30/21 1300  BP: 111/71   Pulse: 99   Resp: 20   Temp: 98.2 F (36.8 C)   SpO2: 91% 97%   Skin clean, dry and intact without evidence of skin break down, no evidence of skin tears noted. IV catheter discontinued intact. Site without signs and symptoms of complications - no redness or edema noted at insertion site, patient denies c/o pain - only slight tenderness at site.  Dressing with slight pressure applied.  D/c Instructions-Education: Discharge instructions given to patient/family with verbalized understanding. D/c education completed with patient/family including follow up instructions, medication list, d/c activities limitations if indicated, with other d/c instructions as indicated by MD - patient able to verbalize understanding, all questions fully answered. Patient instructed to return to ED, call 911, or call MD for any changes in condition.  Patient escorted via [place], and D/C home via private auto.  Clovis Fredrickson, LPN 04/02/4942 9:67 PM

## 2021-12-30 NOTE — Progress Notes (Signed)
RT called for schedule breathing tx. Advised them pt is in no distress and is eating breakfast.

## 2021-12-30 NOTE — Progress Notes (Signed)
PT ambulated in hallway on RA and maintained 91-93%. No distress noted pt states" I feel better than yesterday". MD notified

## 2021-12-31 ENCOUNTER — Other Ambulatory Visit (HOSPITAL_COMMUNITY): Payer: Self-pay | Admitting: Hematology

## 2021-12-31 DIAGNOSIS — E039 Hypothyroidism, unspecified: Secondary | ICD-10-CM

## 2022-01-02 ENCOUNTER — Encounter: Payer: Self-pay | Admitting: *Deleted

## 2022-01-02 ENCOUNTER — Other Ambulatory Visit: Payer: Self-pay | Admitting: *Deleted

## 2022-01-02 MED ORDER — HYDROCODONE-ACETAMINOPHEN 10-325 MG PO TABS: ORAL_TABLET | ORAL | 0 refills | Status: DC

## 2022-01-02 NOTE — Progress Notes (Signed)
Alexander [name] was contacted by telephone to verify understanding of discharge instructions status post their most recent discharge from the hospital on the date:  12/30/21.  Inpatient discharge AVS was re-reviewed with patient, along with cancer center appointments.  Verification of understanding for oncology specific follow-up was validated using the Teach Back method.  Appointments scheduled for PF/Lab and visit with Dr. Delton Coombes 01/04/22.  Transportation to appointments were confirmed for the patient as being self/caregiver.  [name] questions were addressed to their satisfaction upon completion of this post discharge follow-up call for outpatient oncology.

## 2022-01-03 ENCOUNTER — Other Ambulatory Visit: Payer: Self-pay | Admitting: *Deleted

## 2022-01-03 ENCOUNTER — Encounter: Payer: Self-pay | Admitting: Hematology

## 2022-01-03 ENCOUNTER — Other Ambulatory Visit: Payer: Self-pay | Admitting: Hematology

## 2022-01-03 ENCOUNTER — Other Ambulatory Visit (HOSPITAL_COMMUNITY): Payer: Self-pay

## 2022-01-03 DIAGNOSIS — E039 Hypothyroidism, unspecified: Secondary | ICD-10-CM

## 2022-01-03 LAB — CULTURE, BLOOD (ROUTINE X 2)
Culture: NO GROWTH
Culture: NO GROWTH
Special Requests: ADEQUATE

## 2022-01-03 MED ORDER — GUAIFENESIN-DM 100-10 MG/5ML PO SYRP: 10.0000 mL | ORAL_SOLUTION | Freq: Three times a day (TID) | ORAL | 0 refills | Status: AC | PRN

## 2022-01-03 MED ORDER — HYDROCODONE-ACETAMINOPHEN 10-325 MG PO TABS: ORAL_TABLET | ORAL | 0 refills | Status: DC

## 2022-01-04 ENCOUNTER — Inpatient Hospital Stay (HOSPITAL_BASED_OUTPATIENT_CLINIC_OR_DEPARTMENT_OTHER): Payer: 59 | Admitting: Hematology

## 2022-01-04 ENCOUNTER — Inpatient Hospital Stay: Payer: 59

## 2022-01-04 VITALS — BP 130/87 | HR 90 | Temp 97.7°F | Resp 20 | Ht 73.0 in | Wt 125.0 lb

## 2022-01-04 DIAGNOSIS — Z85810 Personal history of malignant neoplasm of tongue: Secondary | ICD-10-CM | POA: Diagnosis not present

## 2022-01-04 DIAGNOSIS — Z9221 Personal history of antineoplastic chemotherapy: Secondary | ICD-10-CM | POA: Diagnosis not present

## 2022-01-04 DIAGNOSIS — C7902 Secondary malignant neoplasm of left kidney and renal pelvis: Secondary | ICD-10-CM | POA: Diagnosis not present

## 2022-01-04 DIAGNOSIS — Z87891 Personal history of nicotine dependence: Secondary | ICD-10-CM | POA: Diagnosis not present

## 2022-01-04 DIAGNOSIS — Z79890 Hormone replacement therapy (postmenopausal): Secondary | ICD-10-CM | POA: Diagnosis not present

## 2022-01-04 DIAGNOSIS — C3492 Malignant neoplasm of unspecified part of left bronchus or lung: Secondary | ICD-10-CM | POA: Diagnosis present

## 2022-01-04 DIAGNOSIS — Z923 Personal history of irradiation: Secondary | ICD-10-CM | POA: Diagnosis not present

## 2022-01-04 DIAGNOSIS — C61 Malignant neoplasm of prostate: Secondary | ICD-10-CM

## 2022-01-04 DIAGNOSIS — N189 Chronic kidney disease, unspecified: Secondary | ICD-10-CM | POA: Diagnosis not present

## 2022-01-04 DIAGNOSIS — M25561 Pain in right knee: Secondary | ICD-10-CM | POA: Diagnosis not present

## 2022-01-04 DIAGNOSIS — E039 Hypothyroidism, unspecified: Secondary | ICD-10-CM | POA: Diagnosis not present

## 2022-01-04 DIAGNOSIS — M25562 Pain in left knee: Secondary | ICD-10-CM | POA: Diagnosis not present

## 2022-01-04 DIAGNOSIS — C7951 Secondary malignant neoplasm of bone: Secondary | ICD-10-CM | POA: Diagnosis not present

## 2022-01-04 DIAGNOSIS — D631 Anemia in chronic kidney disease: Secondary | ICD-10-CM | POA: Diagnosis not present

## 2022-01-04 LAB — CBC WITH DIFFERENTIAL/PLATELET
Abs Immature Granulocytes: 0.04 10*3/uL (ref 0.00–0.07)
Basophils Absolute: 0 10*3/uL (ref 0.0–0.1)
Basophils Relative: 0 %
Eosinophils Absolute: 0 10*3/uL (ref 0.0–0.5)
Eosinophils Relative: 0 %
HCT: 32.5 % — ABNORMAL LOW (ref 39.0–52.0)
Hemoglobin: 10.7 g/dL — ABNORMAL LOW (ref 13.0–17.0)
Immature Granulocytes: 1 %
Lymphocytes Relative: 4 %
Lymphs Abs: 0.3 10*3/uL — ABNORMAL LOW (ref 0.7–4.0)
MCH: 31 pg (ref 26.0–34.0)
MCHC: 32.9 g/dL (ref 30.0–36.0)
MCV: 94.2 fL (ref 80.0–100.0)
Monocytes Absolute: 0.2 10*3/uL (ref 0.1–1.0)
Monocytes Relative: 2 %
Neutro Abs: 6.4 10*3/uL (ref 1.7–7.7)
Neutrophils Relative %: 93 %
Platelets: 198 10*3/uL (ref 150–400)
RBC: 3.45 MIL/uL — ABNORMAL LOW (ref 4.22–5.81)
RDW: 14.9 % (ref 11.5–15.5)
WBC: 6.9 10*3/uL (ref 4.0–10.5)
nRBC: 0 % (ref 0.0–0.2)

## 2022-01-04 LAB — COMPREHENSIVE METABOLIC PANEL
ALT: 54 U/L — ABNORMAL HIGH (ref 0–44)
AST: 42 U/L — ABNORMAL HIGH (ref 15–41)
Albumin: 3.4 g/dL — ABNORMAL LOW (ref 3.5–5.0)
Alkaline Phosphatase: 76 U/L (ref 38–126)
Anion gap: 10 (ref 5–15)
BUN: 53 mg/dL — ABNORMAL HIGH (ref 8–23)
CO2: 22 mmol/L (ref 22–32)
Calcium: 9.1 mg/dL (ref 8.9–10.3)
Chloride: 105 mmol/L (ref 98–111)
Creatinine, Ser: 2.27 mg/dL — ABNORMAL HIGH (ref 0.61–1.24)
GFR, Estimated: 30 mL/min — ABNORMAL LOW (ref >60)
Glucose, Bld: 153 mg/dL — ABNORMAL HIGH (ref 70–99)
Potassium: 4.8 mmol/L (ref 3.5–5.1)
Sodium: 137 mmol/L (ref 135–145)
Total Bilirubin: 0.4 mg/dL (ref 0.3–1.2)
Total Protein: 7.2 g/dL (ref 6.5–8.1)

## 2022-01-04 LAB — MAGNESIUM: Magnesium: 2.1 mg/dL (ref 1.7–2.4)

## 2022-01-04 MED ORDER — SODIUM CHLORIDE 0.9% FLUSH
10.0000 mL | Freq: Once | INTRAVENOUS | Status: AC
  Administered 2022-01-04: 10 mL via INTRAVENOUS

## 2022-01-04 MED ORDER — DEGARELIX ACETATE 80 MG ~~LOC~~ SOLR
80.0000 mg | Freq: Once | SUBCUTANEOUS | Status: AC
  Administered 2022-01-04: 80 mg via SUBCUTANEOUS
  Filled 2022-01-04: qty 4

## 2022-01-04 MED ORDER — HEPARIN SOD (PORK) LOCK FLUSH 100 UNIT/ML IV SOLN
500.0000 [IU] | Freq: Once | INTRAVENOUS | Status: AC
  Administered 2022-01-04: 500 [IU] via INTRAVENOUS

## 2022-01-04 NOTE — Progress Notes (Signed)
[address], [place] [address]   CLINIC:  Medical Oncology/Hematology  PCP:  Lemmie Evens, MD [place] / [place] [address] [phone]   REASON FOR VISIT:  Follow-up for left squamous cell lung cancer and metastatic prostate cancer.  PRIOR THERAPY: Carboplatin, paclitaxel and Keytruda x 6 cycles from 05/03/2018 to 08/21/2018  NGS Results: Foundation 1 MS--stable  CURRENT THERAPY: Gemcitabine D1,8 q21d and degarelix monthly.   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   07/28/2014 Initial Diagnosis   Oropharyngeal cancer   08/03/2014 Imaging   CT CAP- L supraclavicular lymphadenopathy is not completely visualized. This is better seen on the previous neck CT from 07/27/2014. Otherwise, no evidence for metastatic disease in the chest, abdomen, or pelvis.   08/03/2014 Imaging   Bone scan- Uptake at adjacent anterior LEFT 6, 7, 8 ribs likely representing trauma/fractures. Questionable nonspecific increased tracer localization at the posterior RIGHT 8th and 9th ribs, the adjacent nature which raises a a question of trauma as well   08/06/2014 Pathology Results   Dr. Benjamine Mola- Oropharynx, biopsy, Left - INVASIVE SQUAMOUS CELL CARCINOMA.   08/12/2014 Procedure   Dr. Enrique Sack- 1. Multiple extraction of tooth numbers 6, 17, 22, 23, 24, 25, 26, and 27. 3 Quadrants of alveoloplasty   08/17/2014 Pathology Results   PORT and G-TUBE placed by Dr. Carlis Stable.   08/26/2014 PET scan   Large hypermetabolic mass in the left base of tongue. Activity extends across midline to the right base tongue. 2. Intensely hypermetabolic left cervical metastatic lymph nodes. Lymph nodes extend from the left level II position to the left supraclavi   09/01/2014 - 09/22/2014 Chemotherapy   Concurrent  chemoradiation with Cisplatin 100 mg/m2 x 2 cycles with Neulasta support. Held cycle #3 d/t renal toxicity.    09/03/2014 - 10/23/2014 Radiation Therapy   Treated in [facility], IMRT Isidore Moos).  Base of tongue and bilat neck. Total dose: 70 Gy in 35 fractions. (of note, he did miss several treatments requiring BID dosing towards the end of treatment).    01/25/2015 PET scan   Near complete resolution of metabolic activity at the base of tongue. Minimal residual activity is likely post treatment effect. 2. Complete resolution of metabolic activity above LEFT cervical lymph nodes. No evidence of residual metabolically active    3/41/9622 Procedure   Port-a-cath removed [name])    05/03/2018 - 08/23/2018 Chemotherapy   The patient had dexamethasone (DECADRON) 4 MG tablet, 8 mg, Oral, Daily, 1 of 1 cycle, Start date: 05/01/2018, End date: 10/23/2018 palonosetron (ALOXI) injection 0.25 mg, 0.25 mg, Intravenous,  Once, 6 of 6 cycles Administration: 0.25 mg (05/03/2018), 0.25 mg (05/24/2018), 0.25 mg (06/14/2018), 0.25 mg (07/09/2018), 0.25 mg (07/30/2018), 0.25 mg (08/21/2018) pegfilgrastim-cbqv (UDENYCA) injection 6 mg, 6 mg, Subcutaneous, Once, 5 of 5 cycles Administration: 6 mg (05/27/2018), 6 mg (06/17/2018), 6 mg (07/11/2018), 6 mg (08/01/2018), 6 mg (08/23/2018) CARBOplatin (PARAPLATIN) 380 mg in sodium chloride 0.9 % 250 mL chemo infusion, 380 mg (100 % of original dose 381 mg), Intravenous,  Once, 6 of 6 cycles Dose modification:   (original dose 381 mg, Cycle 1),   (original dose 309.5 mg, Cycle 2), 307.5 mg (original dose 309.5 mg, Cycle 5) Administration: 380 mg (05/03/2018), 310 mg (05/24/2018), 310 mg (06/14/2018), 340 mg (07/09/2018), 310 mg (07/30/2018), 350 mg (08/21/2018) PACLitaxel (  TAXOL) 330 mg in sodium chloride 0.9 % 500 mL chemo infusion (> 17m/m2), 175 mg/m2 = 330 mg (100 % of original dose 175 mg/m2), Intravenous,  Once, 6 of 6 cycles Dose modification: 175 mg/m2 (original dose 175 mg/m2, Cycle 1, Reason:  Patient Age) Administration: 330 mg (05/03/2018), 330 mg (05/24/2018), 330 mg (06/14/2018), 330 mg (07/09/2018), 330 mg (07/30/2018), 330 mg (08/21/2018)  for chemotherapy treatment.    05/24/2018 - 05/06/2021 Chemotherapy   Patient is on Treatment Plan : HEAD/NECK Pembrolizumab/Taxol/Carboplatin Q21D     07/05/2021 -  Chemotherapy   Patient is on Treatment Plan : LUNG Gemcitabine D1,8 q21d     Squamous cell lung cancer, left ([facility]  06/14/2018 Initial Diagnosis   Squamous cell lung cancer, left ([facility])     CANCER STAGING:  Cancer Staging  Oropharyngeal carcinoma ([facility] Staging form: Pharynx - Oropharynx, AJCC 7th Edition - Clinical: Stage IVA (T4a, N2b, M0) - Unsigned   INTERVAL HISTORY:  [name] a 71y.o. male, seen for follow-up of metastatic prostate cancer and lung cancer.  He missed his appointments for degarelix twice so far.  He came to our office last week and was transferred to the ER due to dyspnea.  He was admitted to the hospital from 12/29/2021 through 12/30/2021 with COPD exacerbation and acute bronchitis.  He reports that he is taking Abiraterone without fail.  REVIEW OF SYSTEMS:  Review of Systems  Constitutional:  Negative for appetite change, fatigue and unexpected weight change.  Respiratory:  Negative for cough.   Gastrointestinal:  Negative for nausea.  Genitourinary:  Negative for difficulty urinating.   Musculoskeletal:  Positive for arthralgias (7/10 bilateral knee pain).  All other systems reviewed and are negative.   PAST MEDICAL/SURGICAL HISTORY:  Past Medical History:  Diagnosis Date   GERD (gastroesophageal reflux disease)    Mass of neck    dx. oropharyngeal squamous cell carcinoma- Chemo. radiation planned   [facility] ([facility] 07/28/2014   dx. 3 weeks ago.- [facility]   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[facility] 08/06/2014   SCCa of Left BOT   Past Surgical History:  Procedure Laterality Date   BIOPSY   01/15/2018   Procedure: BIOPSY;  Surgeon: FDanie Binder MD;  [facility];  Service: Endoscopy;;  gastric   COLONOSCOPY N/A 03/13/2016   Procedure: COLONOSCOPY;  Surgeon: SDanie Binder MD;  [facility];  Service: Endoscopy;  Laterality: N/A;  2:15 PM   CYSTOSCOPY N/A 08/29/2021   Procedure: CYSTOSCOPY;  Surgeon: MCleon Gustin MD;  Location: [facility];  Service: Urology;  Laterality: N/A;   CYSTOSCOPY WITH INSERTION OF UROLIFT N/A 06/27/2021   Procedure: CYSTOSCOPY WITH INSERTION OF UROLIFT;  Surgeon: MCleon Gustin MD;  Location: [facility];  Service: Urology;  Laterality: N/A;   ESOPHAGOGASTRODUODENOSCOPY (EGD) WITH PROPOFOL N/A 08/17/2014   Procedure: ESOPHAGOGASTRODUODENOSCOPY (EGD) WITH PROPOFOL (procedure #1);  Surgeon: MAviva SignsMd, MD;  Location: [facility];  Service: General;  Laterality: N/A;   ESOPHAGOGASTRODUODENOSCOPY (EGD) WITH PROPOFOL N/A 01/15/2018   Procedure: ESOPHAGOGASTRODUODENOSCOPY (EGD) WITH PROPOFOL;  Surgeon: FDanie Binder MD;  [facility];  Service: Endoscopy;  Laterality: N/A;  9:30am   MULTIPLE EXTRACTIONS WITH ALVEOLOPLASTY N/A 08/12/2014   Procedure: Extraction of tooth #'s 6,17,22,23,24,25,26,27 with alveoloplasty;  Surgeon: RLenn Cal DDS;  Location: [facility];  Service: Oral Surgery;  Laterality: N/A;   PANENDOSCOPY N/A 08/06/2014   Procedure: PANENDOSCOPY WITH BIOPSY;  Surgeon: Leta Baptist, MD;  Location: [facility];  Service: ENT;  Laterality: N/A;   PEG PLACEMENT Left 08/17/14   PEG PLACEMENT N/A 08/17/2014   Procedure: PERCUTANEOUS ENDOSCOPIC GASTROSTOMY (PEG) PLACEMENT (procedure #1);  Surgeon: Aviva Signs Md, MD;  Location: [facility];  Service: General;  Laterality: N/A;   PORT-A-CATH REMOVAL Right 07/17/2016   Procedure: MINOR REMOVAL PORT-A-CATH;  Surgeon: Aviva Signs, MD;  Location: [facility];  Service: General;  Laterality: Right;   PORTACATH PLACEMENT Right 08/17/14   PORTACATH PLACEMENT Right 08/17/2014    Procedure: INSERTION PORT-A-CATH (procedure #2);  Surgeon: Aviva Signs Md, MD;  Location: [facility];  Service: General;  Laterality: Right;   PORTACATH PLACEMENT Left 04/26/2018   Procedure: INSERTION PORT-A-CATH (attached catheter in left subclavian);  Surgeon: Aviva Signs, MD;  Location: [facility];  Service: General;  Laterality: Left;   SAVORY DILATION N/A 01/15/2018   Procedure: SAVORY DILATION;  Surgeon: Danie Binder, MD;  [facility];  Service: Endoscopy;  Laterality: N/A;   TRANSURETHRAL RESECTION OF PROSTATE N/A 08/29/2021   Procedure: TRANSURETHRAL RESECTION OF THE PROSTATE (TURP);  Surgeon: Cleon Gustin, MD;  Location: [facility];  Service: Urology;  Laterality: N/A;   VIDEO BRONCHOSCOPY WITH ENDOBRONCHIAL ULTRASOUND N/A 04/15/2018   Procedure: VIDEO BRONCHOSCOPY WITH ENDOBRONCHIAL ULTRASOUND;  Surgeon: Melrose Nakayama, MD;  Location: [facility] OR;  Service: Thoracic;  Laterality: N/A;    SOCIAL HISTORY:  Social History   Socioeconomic History   Marital status: Legally Separated    Spouse name: Not on file   Number of children: 5   Years of education: Not on file   Highest education level: Not on file  Occupational History   Not on file  Tobacco Use   Smoking status: Former    Packs/day: 0.50    Years: 30.00    Total pack years: 15.00    Types: Cigarettes    Quit date: 07/22/2014    Years since quitting: 7.4   Smokeless tobacco: Never  Vaping Use   Vaping Use: Never used  Substance and Sexual Activity   Alcohol use: Not Currently    Alcohol/week: 0.0 standard drinks of alcohol    Comment: None currently (11/09/17); previously 1-2 beers on the weekend   Drug use: No   Sexual activity: Yes  Other Topics Concern   Not on file  Social History Narrative   Not on file   Social Determinants of Health   Financial Resource Strain: Low Risk  (02/12/2020)   Overall Financial Resource Strain (CARDIA)    Difficulty of Paying Living Expenses: Not hard at all   Food Insecurity: No Food Insecurity (12/29/2021)   Hunger Vital Sign    Worried About Running Out of Food in the Last Year: Never true    Ran Out of Food in the Last Year: Never true  Transportation Needs: No Transportation Needs (12/29/2021)   PRAPARE - Hydrologist (Medical): No    Lack of Transportation (Non-Medical): No  Physical Activity: Inactive (02/12/2020)   Exercise Vital Sign    Days of Exercise per Week: 0 days    Minutes of Exercise per Session: 0 min  Stress: No Stress Concern Present (02/12/2020)   [place]    Feeling of Stress : Not at all  Social Connections: Moderately Isolated (02/12/2020)   Social Connection and Isolation Panel [NHANES]    Frequency of Communication with Friends and  Family: More than three times a week    Frequency of Social Gatherings with Friends and Family: Twice a week    Attends Religious Services: Never    Marine scientist or Organizations: No    Attends Archivist Meetings: Never    Marital Status: Married  Human resources officer Violence: Not At Risk (12/29/2021)   Humiliation, Afraid, Rape, and Kick questionnaire    Fear of Current or Ex-Partner: No    Emotionally Abused: No    Physically Abused: No    Sexually Abused: No    FAMILY HISTORY:  Family History  Problem Relation Age of Onset   Colon cancer Neg Hx    Gastric cancer Neg Hx    Esophageal cancer Neg Hx     CURRENT MEDICATIONS:  Current Outpatient Medications  Medication Sig Dispense Refill   abiraterone acetate (ZYTIGA) 250 MG tablet Take 4 tablets (1,000 mg total) by mouth daily. Take on an empty stomach 1 hour before or 2 hours after a meal 120 tablet 3   albuterol (VENTOLIN HFA) 108 (90 Base) MCG/ACT inhaler Inhale 2 puffs into the lungs every 6 (six) hours as needed for wheezing or shortness of breath. 8 g 2   Gemcitabine HCl (GEMZAR IV) Inject into the vein once  a week. Days 1 & 8 q 21 days     guaiFENesin-dextromethorphan (ROBITUSSIN DM) 100-10 MG/5ML syrup Take 10 mLs by mouth every 8 (eight) hours as needed for cough. 118 mL 0   HYDROcodone-acetaminophen (NORCO) 10-325 MG tablet TAKE 1 TABLET EVERY 12 HOURS AS NEEDED. 60 tablet 0   levothyroxine (SYNTHROID) 88 MCG tablet TAKE ONE TABLET BY MOUTH DAILY BEFORE BREAKFAST 30 tablet 0   naproxen sodium (ALEVE) 220 MG tablet Take 220 mg by mouth daily as needed (pain.).     omeprazole (PRILOSEC) 20 MG capsule Take 1 capsule (20 mg total) by mouth daily. 30 capsule 3   predniSONE (DELTASONE) 20 MG tablet Take 2 tablets (40 mg total) by mouth daily with breakfast for 5 days. 10 tablet 0   sildenafil (VIAGRA) 100 MG tablet Take 1 tablet (100 mg total) by mouth as needed for erectile dysfunction. 10 tablet 6   tamsulosin (FLOMAX) 0.4 MG CAPS capsule Take 1 capsule (0.4 mg total) by mouth 2 (two) times daily. 60 capsule 11   No current facility-administered medications for this visit.   Facility-Administered Medications Ordered in Other Visits  Medication Dose Route Frequency Provider Last Rate Last Admin   degarelix (FIRMAGON) injection 80 mg  80 mg Subcutaneous Once Derek Jack, MD        ALLERGIES:  No Known Allergies  PHYSICAL EXAM:  Performance status (ECOG): 1 - Symptomatic but completely ambulatory  There were no vitals filed for this visit. Wt Readings from Last 3 Encounters:  01/04/22 125 lb (56.7 kg)  12/29/21 118 lb 8 oz (53.8 kg)  11/16/21 129 lb 3 oz (58.6 kg)   Physical Exam Vitals reviewed.  Constitutional:      Appearance: Normal appearance.  Cardiovascular:     Rate and Rhythm: Normal rate and regular rhythm.     Pulses: Normal pulses.     Heart sounds: Normal heart sounds.  Pulmonary:     Effort: Pulmonary effort is normal.     Breath sounds: Normal breath sounds.  Neurological:     General: No focal deficit present.     Mental Status: He is alert and oriented to  person, place, and time.  Psychiatric:        Mood and Affect: Mood normal.        Behavior: Behavior normal.     LABORATORY DATA:  I have reviewed the labs as listed.     Latest Ref Rng & Units 01/04/2022    1:39 PM 12/30/2021    3:57 AM 12/29/2021    1:45 PM  CBC  WBC 4.0 - 10.5 K/uL 6.9  10.3  10.6   Hemoglobin 13.0 - 17.0 g/dL 10.7  11.2  12.6   Hematocrit 39.0 - 52.0 % 32.5  34.0  38.8   Platelets 150 - 400 K/uL 198  184  186       Latest Ref Rng & Units 01/04/2022    1:39 PM 12/29/2021    2:34 PM 12/29/2021    1:45 PM  CMP  Glucose 70 - 99 mg/dL 153  87  85   BUN 8 - 23 mg/dL 53  37  36   Creatinine 0.61 - 1.24 mg/dL 2.27  1.96  1.98   Sodium 135 - 145 mmol/L 137  138  137   Potassium 3.5 - 5.1 mmol/L 4.8  4.1  4.0   Chloride 98 - 111 mmol/L 105  103  103   CO2 22 - 32 mmol/L 22  22  22    Calcium 8.9 - 10.3 mg/dL 9.1  9.0  9.0   Total Protein 6.5 - 8.1 g/dL 7.2  7.1  7.1   Total Bilirubin 0.3 - 1.2 mg/dL 0.4  1.1  1.2   Alkaline Phos 38 - 126 U/L 76  92  92   AST 15 - 41 U/L 42  19  20   ALT 0 - 44 U/L 54  16  16     DIAGNOSTIC IMAGING:  I have independently reviewed the scans and discussed with the patient. DG Chest 2 View  Result Date: 12/29/2021 CLINICAL DATA:  Shortness of breath. EXAM: CHEST - 2 VIEW COMPARISON:  April 26, 2018.  October 03, 2021. FINDINGS: The heart size and mediastinal contours are within normal limits. Hyperexpansion of the lungs is noted. Left subclavian Port-A-Cath is noted with distal tip in expected position of cavoatrial junction. There is in ill-defined density projected over a lower thoracic vertebral body on lateral projection only which corresponds in location to left lower lobe pulmonary nodule noted on prior CT scan. Bony thorax is unremarkable. IMPRESSION: Hyperexpansion of the lungs. Ill-defined rounded density seen projected over lower thoracic vertebral body on lateral projection only; this corresponds to left lower lobe pulmonary  nodule and probable metastatic lesion seen on prior chest CT. No other definite abnormality is seen involving the lungs. Electronically Signed   By: Marijo Conception M.D.   On: 12/29/2021 15:19     ASSESSMENT:  1.  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 -6 cycles of carboplatin, paclitaxel and pembrolizumab from 05/03/2018 through 08/21/2018. -Maintenance pembrolizumab started on 09/11/2018. -PET scan on 09/15/2019 showed interval decrease in hypermetabolic areas associated with tongue and floor of the mouth.  Hypermetabolic metastatic lymphadenopathy in the chest is stable.  No new sites seen. -PET scan on 03/15/2020 shows persistent, stable hypermetabolism in the tongue/floor of mouth.  Slight interval decrease in hypermetabolism with mediastinal/hilar adenopathy.  Persistent hypermetabolic focus in the right supraclavicular region.  New focus of hypermetabolic them identified in the right external iliac chain of pelvis with no discernible adenopathy on  the CT. -CT CAP on 11/24/2020 showed left lower lobe lung nodule measuring 1.3 x 1.1 cm, previously 1.0 x 0.9 cm on CT scan from June.  Lesion was negative on PSMA PET scan.  Stable mediastinal lymph nodes. 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 - 6 cycles of carboplatin, paclitaxel and pembrolizumab from 01/12/2021 through 05/04/2021 with progression. - NGS test: No targetable mutations.  PD-L1 TPS is negative.  MSI-stable.  TMB-low. - Gemcitabine 2 weeks on/1 week off started on 07/05/2021.   2.  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  3.  Prostate cancer: - Evaluated by Dr. Alyson Ingles and biopsy was recommended.  Patient declined. - Last PSA was 99.7 on 10/03/2021. - We reviewed PSMA PET scan (10/13/2021): New multifocal bone  mets in the pelvis, spine, bilateral ribs.  New mediastinal and hilar lymph nodes.  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  Intense radiotracer avid tumor within the prostate gland.  Persistent radiotracer avid pelvic lymph nodes. - Degarelix loading dose started on 10/27/2021.  Abiraterone 1000 mg daily started on 11/03/2021.   4.  Left kidney mass: - Biopsy of the left kidney mass was consistent with metastatic squamous cell carcinoma.   PLAN:  1.  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    2.  Hypothyroidism: -Continue Synthroid 88 mcg.  Last TSH was within normal limits.   3.  Bilateral knee pains: -Continue hydrocodone twice daily as needed.   4.  Bone metastasis: -He has bone metastasis from prostate cancer.  We talked about initiating him on denosumab monthly to decrease SRE. - We talked about side effects including hypocalcemia and rare chance of osteonecrosis of the jaw.  He wears dentures. - We will initiate denosumab after insurance authorization.   5.  Anemia/CKD: -Anemia from myelosuppression and CKD.  He is status post Venofer.  Hemoglobin improved to 10.7.  6.  Prostate cancer Gleason 4+5=9: -Degarelix started on 10/27/2021.  He missed September and last week doses. -Abiraterone 1000 mg daily started on 11/03/2021 along with prednisone. -He continues to tolerate Abiraterone well.  Blood pressure is 130/87.  Potassium is 4.8.  CKD stable with creatinine 2.27.  He has mildly elevated AST and ALT of 42 and 54 respectively.  Last PSA has improved to 17.7 (12/29/2021) from 99 on 10/03/2021. - RTC 4 weeks with repeat LFTs and PSA.   Orders placed this encounter:  No orders of the defined types were placed in this encounter.    Derek Jack, MD [place] [phone]

## 2022-01-04 NOTE — Patient Instructions (Signed)
[facility]  Discharge Instructions: Thank you for choosing [facility] to provide your oncology and hematology care.  If you have a lab appointment with the [facility], please come in thru the Main Entrance and check in at the main information desk.  Wear comfortable clothing and clothing appropriate for easy access to any Portacath or PICC line.   We strive to give you quality time with your provider. You may need to reschedule your appointment if you arrive late (15 or more minutes).  Arriving late affects you and other patients whose appointments are after yours.  Also, if you miss three or more appointments without notifying the office, you may be dismissed from the clinic at the provider's discretion.      For prescription refill requests, have your pharmacy contact our office and allow 72 hours for refills to be completed.    Today you received the following chemotherapy and/or immunotherapy agents [place]      To help prevent nausea and vomiting after your treatment, we encourage you to take your nausea medication as directed.  BELOW ARE SYMPTOMS THAT SHOULD BE REPORTED IMMEDIATELY: *FEVER GREATER THAN 100.4 F (38 C) OR HIGHER *CHILLS OR SWEATING *NAUSEA AND VOMITING THAT IS NOT CONTROLLED WITH YOUR NAUSEA MEDICATION *UNUSUAL SHORTNESS OF BREATH *UNUSUAL BRUISING OR BLEEDING *URINARY PROBLEMS (pain or burning when urinating, or frequent urination) *BOWEL PROBLEMS (unusual diarrhea, constipation, pain near the anus) TENDERNESS IN MOUTH AND THROAT WITH OR WITHOUT PRESENCE OF ULCERS (sore throat, sores in mouth, or a toothache) UNUSUAL RASH, SWELLING OR PAIN  UNUSUAL VAGINAL DISCHARGE OR ITCHING   Items with * indicate a potential emergency and should be followed up as soon as possible or go to the Emergency Department if any problems should occur.  Please show the CHEMOTHERAPY ALERT CARD or IMMUNOTHERAPY ALERT CARD at check-in to the  Emergency Department and triage nurse.  Should you have questions after your visit or need to cancel or reschedule your appointment, please contact [facility] [phone]  and follow the prompts.  Office hours are 8:00 a.m. to 4:30 p.m. Monday - Friday. Please note that voicemails left after 4:00 p.m. may not be returned until the following business day.  We are closed weekends and major holidays. You have access to a nurse at all times for urgent questions. Please call the main number to the clinic [phone] and follow the prompts.  For any non-urgent questions, you may also contact your provider using MyChart. We now offer e-Visits for anyone 67 and older to request care online for non-urgent symptoms. For details visit [email or web address].   Also download the MyChart app! Go to the app store, search "MyChart", open the app, select [facility], and log in with your MyChart username and password.  Masks are optional in the cancer centers. If you would like for your care team to wear a mask while they are taking care of you, please let them know. You may have one support person who is at least 72 years old accompany you for your appointments.

## 2022-01-04 NOTE — Progress Notes (Signed)
Patients port flushed without difficulty.  Good blood return noted with no bruising or swelling noted at site.  Band aid applied.  VSS with discharge and left in satisfactory condition with no s/s of distress noted.    Stable during [place] administration without incident; injection site WNL; see MAR for injection details.  Patient tolerated procedure well and without incident.  No questions or complaints noted at this time.

## 2022-01-04 NOTE — Patient Instructions (Signed)
[facility]  Discharge Instructions  You were seen and examined today by Dr. Delton Coombes.  Dr. Delton Coombes discussed your most recent lab work which revealed everything looks okay.  Continue taking Zytiga as prescribed. Continue getting your monthly injection. Dr. Delton Coombes also discussed starting you on Xgeva injections to help with your bones.   Follow-up as scheduled in 1 month with labs and injections.    Thank you for choosing [facility] to provide your oncology and hematology care.   To afford each patient quality time with our provider, please arrive at least 15 minutes before your scheduled appointment time. You may need to reschedule your appointment if you arrive late (10 or more minutes). Arriving late affects you and other patients whose appointments are after yours.  Also, if you miss three or more appointments without notifying the office, you may be dismissed from the clinic at the provider's discretion.    Again, thank you for choosing [facility].  Our hope is that these requests will decrease the amount of time that you wait before being seen by our physicians.   If you have a lab appointment with the [facility] please come in thru the Main Entrance and check in at the main information desk.           _____________________________________________________________  Should you have questions after your visit to [facility], please contact our office at [phone] and follow the prompts.  Our office hours are 8:00 a.m. to 4:30 p.m. Monday - Thursday and 8:00 a.m. to 2:30 p.m. Friday.  Please note that voicemails left after 4:00 p.m. may not be returned until the following business day.  We are closed weekends and all major holidays.  You do have access to a nurse 24-7, just call the main number to the clinic [phone] and do not press any options, hold on the line and a nurse will answer  the phone.    For prescription refill requests, have your pharmacy contact our office and allow 72 hours.    Masks are optional in the cancer centers. If you would like for your care team to wear a mask while they are taking care of you, please let them know. You may have one support person who is at least 72 years old accompany you for your appointments.

## 2022-01-05 ENCOUNTER — Other Ambulatory Visit: Payer: Self-pay

## 2022-01-06 ENCOUNTER — Other Ambulatory Visit: Payer: Self-pay

## 2022-01-20 ENCOUNTER — Ambulatory Visit: Payer: Medicare Other | Admitting: Urology

## 2022-01-20 DIAGNOSIS — R339 Retention of urine, unspecified: Secondary | ICD-10-CM

## 2022-01-20 DIAGNOSIS — N138 Other obstructive and reflux uropathy: Secondary | ICD-10-CM

## 2022-01-23 ENCOUNTER — Other Ambulatory Visit (HOSPITAL_COMMUNITY): Payer: Self-pay

## 2022-01-25 ENCOUNTER — Other Ambulatory Visit (HOSPITAL_COMMUNITY): Payer: Self-pay

## 2022-01-27 ENCOUNTER — Other Ambulatory Visit (HOSPITAL_COMMUNITY): Payer: Self-pay

## 2022-02-02 ENCOUNTER — Other Ambulatory Visit: Payer: Self-pay | Admitting: *Deleted

## 2022-02-02 DIAGNOSIS — C61 Malignant neoplasm of prostate: Secondary | ICD-10-CM

## 2022-02-02 DIAGNOSIS — E039 Hypothyroidism, unspecified: Secondary | ICD-10-CM

## 2022-02-02 MED ORDER — OMEPRAZOLE 20 MG PO CPDR: 20.0000 mg | DELAYED_RELEASE_CAPSULE | Freq: Every day | ORAL | 3 refills | Status: AC

## 2022-02-02 MED ORDER — HYDROCODONE-ACETAMINOPHEN 10-325 MG PO TABS: ORAL_TABLET | ORAL | 0 refills | Status: DC

## 2022-02-02 MED ORDER — LEVOTHYROXINE SODIUM 88 MCG PO TABS: 88.0000 ug | ORAL_TABLET | Freq: Every day | ORAL | 3 refills | Status: AC

## 2022-02-02 MED ORDER — ABIRATERONE ACETATE 250 MG PO TABS: 1000.0000 mg | ORAL_TABLET | Freq: Every day | ORAL | 3 refills | Status: DC

## 2022-02-03 ENCOUNTER — Other Ambulatory Visit: Payer: Self-pay | Admitting: Hematology

## 2022-02-06 ENCOUNTER — Other Ambulatory Visit: Payer: Self-pay | Admitting: *Deleted

## 2022-02-06 DIAGNOSIS — C61 Malignant neoplasm of prostate: Secondary | ICD-10-CM

## 2022-02-06 MED ORDER — ABIRATERONE ACETATE 250 MG PO TABS: 1000.0000 mg | ORAL_TABLET | Freq: Every day | ORAL | 3 refills | Status: DC

## 2022-02-07 ENCOUNTER — Other Ambulatory Visit: Payer: Self-pay | Admitting: *Deleted

## 2022-02-07 DIAGNOSIS — C61 Malignant neoplasm of prostate: Secondary | ICD-10-CM

## 2022-02-07 MED ORDER — ABIRATERONE ACETATE 250 MG PO TABS: 1000.0000 mg | ORAL_TABLET | Freq: Every day | ORAL | 3 refills | Status: DC

## 2022-02-07 MED ORDER — PREDNISONE 5 MG PO TABS: 5.0000 mg | ORAL_TABLET | Freq: Every day | ORAL | 0 refills | Status: AC

## 2022-02-08 ENCOUNTER — Inpatient Hospital Stay: Payer: 59

## 2022-02-08 ENCOUNTER — Inpatient Hospital Stay: Payer: 59 | Attending: Hematology | Admitting: Hematology

## 2022-02-08 DIAGNOSIS — M25562 Pain in left knee: Secondary | ICD-10-CM | POA: Insufficient documentation

## 2022-02-08 DIAGNOSIS — N189 Chronic kidney disease, unspecified: Secondary | ICD-10-CM | POA: Diagnosis not present

## 2022-02-08 DIAGNOSIS — Z87891 Personal history of nicotine dependence: Secondary | ICD-10-CM | POA: Insufficient documentation

## 2022-02-08 DIAGNOSIS — E039 Hypothyroidism, unspecified: Secondary | ICD-10-CM | POA: Diagnosis not present

## 2022-02-08 DIAGNOSIS — Z9221 Personal history of antineoplastic chemotherapy: Secondary | ICD-10-CM | POA: Insufficient documentation

## 2022-02-08 DIAGNOSIS — K5900 Constipation, unspecified: Secondary | ICD-10-CM | POA: Insufficient documentation

## 2022-02-08 DIAGNOSIS — Z923 Personal history of irradiation: Secondary | ICD-10-CM | POA: Insufficient documentation

## 2022-02-08 DIAGNOSIS — D509 Iron deficiency anemia, unspecified: Secondary | ICD-10-CM

## 2022-02-08 DIAGNOSIS — Z79890 Hormone replacement therapy (postmenopausal): Secondary | ICD-10-CM | POA: Diagnosis not present

## 2022-02-08 DIAGNOSIS — C3492 Malignant neoplasm of unspecified part of left bronchus or lung: Secondary | ICD-10-CM | POA: Insufficient documentation

## 2022-02-08 DIAGNOSIS — C7951 Secondary malignant neoplasm of bone: Secondary | ICD-10-CM | POA: Diagnosis not present

## 2022-02-08 DIAGNOSIS — D631 Anemia in chronic kidney disease: Secondary | ICD-10-CM | POA: Diagnosis not present

## 2022-02-08 DIAGNOSIS — Z85810 Personal history of malignant neoplasm of tongue: Secondary | ICD-10-CM | POA: Diagnosis not present

## 2022-02-08 DIAGNOSIS — C61 Malignant neoplasm of prostate: Secondary | ICD-10-CM

## 2022-02-08 DIAGNOSIS — M25561 Pain in right knee: Secondary | ICD-10-CM | POA: Diagnosis not present

## 2022-02-08 DIAGNOSIS — C7902 Secondary malignant neoplasm of left kidney and renal pelvis: Secondary | ICD-10-CM | POA: Diagnosis not present

## 2022-02-08 DIAGNOSIS — Z95828 Presence of other vascular implants and grafts: Secondary | ICD-10-CM

## 2022-02-08 LAB — PSA: Prostatic Specific Antigen: 14.93 ng/mL — ABNORMAL HIGH (ref 0.00–4.00)

## 2022-02-08 LAB — COMPREHENSIVE METABOLIC PANEL
ALT: 18 U/L (ref 0–44)
AST: 22 U/L (ref 15–41)
Albumin: 3 g/dL — ABNORMAL LOW (ref 3.5–5.0)
Alkaline Phosphatase: 123 U/L (ref 38–126)
Anion gap: 10 (ref 5–15)
BUN: 28 mg/dL — ABNORMAL HIGH (ref 8–23)
CO2: 23 mmol/L (ref 22–32)
Calcium: 8.8 mg/dL — ABNORMAL LOW (ref 8.9–10.3)
Chloride: 99 mmol/L (ref 98–111)
Creatinine, Ser: 1.7 mg/dL — ABNORMAL HIGH (ref 0.61–1.24)
GFR, Estimated: 42 mL/min — ABNORMAL LOW (ref >60)
Glucose, Bld: 111 mg/dL — ABNORMAL HIGH (ref 70–99)
Potassium: 4.6 mmol/L (ref 3.5–5.1)
Sodium: 132 mmol/L — ABNORMAL LOW (ref 135–145)
Total Bilirubin: 0.3 mg/dL (ref 0.3–1.2)
Total Protein: 6.9 g/dL (ref 6.5–8.1)

## 2022-02-08 LAB — TSH: TSH: 21.674 u[IU]/mL — ABNORMAL HIGH (ref 0.350–4.500)

## 2022-02-08 LAB — CBC WITH DIFFERENTIAL/PLATELET
Abs Immature Granulocytes: 0.02 10*3/uL (ref 0.00–0.07)
Basophils Absolute: 0 10*3/uL (ref 0.0–0.1)
Basophils Relative: 0 %
Eosinophils Absolute: 0 10*3/uL (ref 0.0–0.5)
Eosinophils Relative: 1 %
HCT: 33.4 % — ABNORMAL LOW (ref 39.0–52.0)
Hemoglobin: 10.7 g/dL — ABNORMAL LOW (ref 13.0–17.0)
Immature Granulocytes: 0 %
Lymphocytes Relative: 19 %
Lymphs Abs: 1.1 10*3/uL (ref 0.7–4.0)
MCH: 29.1 pg (ref 26.0–34.0)
MCHC: 32 g/dL (ref 30.0–36.0)
MCV: 90.8 fL (ref 80.0–100.0)
Monocytes Absolute: 0.6 10*3/uL (ref 0.1–1.0)
Monocytes Relative: 10 %
Neutro Abs: 4.1 10*3/uL (ref 1.7–7.7)
Neutrophils Relative %: 70 %
Platelets: 256 10*3/uL (ref 150–400)
RBC: 3.68 MIL/uL — ABNORMAL LOW (ref 4.22–5.81)
RDW: 16.1 % — ABNORMAL HIGH (ref 11.5–15.5)
WBC: 5.8 10*3/uL (ref 4.0–10.5)
nRBC: 0 % (ref 0.0–0.2)

## 2022-02-08 LAB — MAGNESIUM: Magnesium: 2 mg/dL (ref 1.7–2.4)

## 2022-02-08 MED ORDER — DEGARELIX ACETATE 80 MG ~~LOC~~ SOLR
80.0000 mg | Freq: Once | SUBCUTANEOUS | Status: AC
  Administered 2022-02-08: 80 mg via SUBCUTANEOUS
  Filled 2022-02-08: qty 4

## 2022-02-08 MED ORDER — HEPARIN SOD (PORK) LOCK FLUSH 100 UNIT/ML IV SOLN
500.0000 [IU] | Freq: Once | INTRAVENOUS | Status: AC
  Administered 2022-02-08: 500 [IU] via INTRAVENOUS

## 2022-02-08 MED ORDER — ALBUTEROL SULFATE HFA 108 (90 BASE) MCG/ACT IN AERS: 2.0000 | INHALATION_SPRAY | Freq: Four times a day (QID) | RESPIRATORY_TRACT | 11 refills | Status: AC | PRN

## 2022-02-08 MED ORDER — DENOSUMAB 120 MG/1.7ML ~~LOC~~ SOLN
120.0000 mg | Freq: Once | SUBCUTANEOUS | Status: AC
  Administered 2022-02-08: 120 mg via SUBCUTANEOUS
  Filled 2022-02-08: qty 1.7

## 2022-02-08 MED ORDER — SODIUM CHLORIDE 0.9% FLUSH
10.0000 mL | INTRAVENOUS | Status: DC | PRN
  Administered 2022-02-08: 10 mL via INTRAVENOUS

## 2022-02-08 NOTE — Progress Notes (Signed)
Patient tolerated Mills Koller and Xgeva injections with no complaints voiced.  Site clean and dry with no bruising or swelling noted.  No complaints of pain.  Discharged with vital signs stable and no signs or symptoms of distress noted.

## 2022-02-08 NOTE — Patient Instructions (Signed)
[name]  Discharge Instructions  You were seen and examined today by Dr. Delton Coombes.  Dr. Delton Coombes discussed your most recent lab work which revealed that everything looks okay. You are slightly anemic and your sodium and protein in your blood is low.  Increase your protein in your diet by eating more beans, peas, soy, tofu, or meats. Continue taking your medication as prescribed.  Follow-up as scheduled in 4 weeks.    Thank you for choosing [facility] to provide your oncology and hematology care.   To afford each patient quality time with our provider, please arrive at least 15 minutes before your scheduled appointment time. You may need to reschedule your appointment if you arrive late (10 or more minutes). Arriving late affects you and other patients whose appointments are after yours.  Also, if you miss three or more appointments without notifying the office, you may be dismissed from the clinic at the provider's discretion.    Again, thank you for choosing [facility].  Our hope is that these requests will decrease the amount of time that you wait before being seen by our physicians.   If you have a lab appointment with the [facility] please come in thru the Main Entrance and check in at the main information desk.           _____________________________________________________________  Should you have questions after your visit to [facility], please contact our office at [phone] and follow the prompts.  Our office hours are 8:00 a.m. to 4:30 p.m. Monday - Thursday and 8:00 a.m. to 2:30 p.m. Friday.  Please note that voicemails left after 4:00 p.m. may not be returned until the following business day.  We are closed weekends and all major holidays.  You do have access to a nurse 24-7, just call the main number to the clinic [phone] and do not press any options, hold on the line and a  nurse will answer the phone.    For prescription refill requests, have your pharmacy contact our office and allow 72 hours.    Masks are optional in the cancer centers. If you would like for your care team to wear a mask while they are taking care of you, please let them know. You may have one support person who is at least 72 years old accompany you for your appointments.

## 2022-02-08 NOTE — Patient Instructions (Signed)
[facility]  Discharge Instructions: Thank you for choosing [facility] to provide your oncology and hematology care.  If you have a lab appointment with the Cancer Center, please come in thru the Main Entrance and check in at the main information desk.  Wear comfortable clothing and clothing appropriate for easy access to any Portacath or PICC line.   We strive to give you quality time with your provider. You may need to reschedule your appointment if you arrive late (15 or more minutes).  Arriving late affects you and other patients whose appointments are after yours.  Also, if you miss three or more appointments without notifying the office, you may be dismissed from the clinic at the provider's discretion.      For prescription refill requests, have your pharmacy contact our office and allow 72 hours for refills to be completed.     To help prevent nausea and vomiting after your treatment, we encourage you to take your nausea medication as directed.  BELOW ARE SYMPTOMS THAT SHOULD BE REPORTED IMMEDIATELY: *FEVER GREATER THAN 100.4 F (38 C) OR HIGHER *CHILLS OR SWEATING *NAUSEA AND VOMITING THAT IS NOT CONTROLLED WITH YOUR NAUSEA MEDICATION *UNUSUAL SHORTNESS OF BREATH *UNUSUAL BRUISING OR BLEEDING *URINARY PROBLEMS (pain or burning when urinating, or frequent urination) *BOWEL PROBLEMS (unusual diarrhea, constipation, pain near the anus) TENDERNESS IN MOUTH AND THROAT WITH OR WITHOUT PRESENCE OF ULCERS (sore throat, sores in mouth, or a toothache) UNUSUAL RASH, SWELLING OR PAIN  UNUSUAL VAGINAL DISCHARGE OR ITCHING   Items with * indicate a potential emergency and should be followed up as soon as possible or go to the Emergency Department if any problems should occur.  Please show the CHEMOTHERAPY ALERT CARD or IMMUNOTHERAPY ALERT CARD at check-in to the Emergency Department and triage nurse.  Should you have questions after your visit or need to  cancel or reschedule your appointment, please contact [facility] [phone]  and follow the prompts.  Office hours are 8:00 a.m. to 4:30 p.m. Monday - Friday. Please note that voicemails left after 4:00 p.m. may not be returned until the following business day.  We are closed weekends and major holidays. You have access to a nurse at all times for urgent questions. Please call the main number to the clinic [phone] and follow the prompts.  For any non-urgent questions, you may also contact your provider using MyChart. We now offer e-Visits for anyone 18 and older to request care online for non-urgent symptoms. For details visit [email or web address].   Also download the MyChart app! Go to the app store, search "MyChart", open the app, select , and log in with your MyChart username and password.  Masks are optional in the cancer centers. If you would like for your care team to wear a mask while they are taking care of you, please let them know. You may have one support person who is at least 72 years old accompany you for your appointments.  

## 2022-02-08 NOTE — Progress Notes (Signed)
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)  07/27/2014 Imaging   CT neck- Advanced stage oropharyngeal cancer with necrotic adenopathy accounting for the left neck swelling.   07/28/2014 Initial Diagnosis   Oropharyngeal cancer   08/03/2014 Imaging   CT CAP- L supraclavicular lymphadenopathy is not completely visualized. This is better seen on the previous neck CT from 07/27/2014. Otherwise, no evidence for metastatic disease in the chest, abdomen, or pelvis.   08/03/2014 Imaging   Bone scan- Uptake at adjacent anterior LEFT 6, 7, 8 ribs likely representing trauma/fractures. Questionable nonspecific increased tracer localization at the posterior RIGHT 8th and 9th ribs, the adjacent nature which raises a a question of trauma as well   08/06/2014 Pathology Results   Alexander [name]- Oropharynx, biopsy, Left - INVASIVE SQUAMOUS CELL CARCINOMA.   08/12/2014 Procedure   Alexander [name]- 1. Multiple extraction of tooth numbers 6, 17, 22, 23, 24, 25, 26, and 27. 3 Quadrants of alveoloplasty   08/17/2014 Pathology Results   PORT and G-TUBE placed by Alexander [name].   08/26/2014 PET scan   Large hypermetabolic mass in the left base of tongue. Activity extends across midline Alexander the right base tongue. 2. Intensely hypermetabolic left cervical metastatic lymph nodes. Lymph nodes extend from the left level II position Alexander the left supraclavi   09/01/2014 - 09/22/2014  Chemotherapy   Concurrent chemoradiation with Cisplatin 100 mg/m2 x 2 cycles with Neulasta support. Held cycle #3 d/t renal toxicity.    09/03/2014 - 10/23/2014 Radiation Therapy   Treated in [place], IMRT Alexander [name]).  Base of tongue and bilat neck. Total dose: 70 Gy in 35 fractions. (of note, he did miss several treatments requiring BID dosing towards the end of treatment).    01/25/2015 PET scan   Near complete resolution of metabolic activity at the base of tongue. Minimal residual activity is likely post treatment effect. 2. Complete resolution of metabolic activity above LEFT cervical lymph nodes. No evidence of residual metabolically active    10/27/2334 Procedure   Port-a-cath removed Alexander [name])    05/03/2018 - 08/23/2018 Chemotherapy   The patient had dexamethasone (DECADRON) 4 MG tablet, 8 mg, Oral, Daily, 1 of 1 cycle, Start date: 05/01/2018, End date: 10/23/2018 palonosetron (ALOXI) injection 0.25 mg, 0.25 mg, Intravenous,  Once, 6 of 6 cycles Administration: 0.25 mg (05/03/2018), 0.25 mg (05/24/2018), 0.25 mg (06/14/2018), 0.25 mg (07/09/2018), 0.25 mg (07/30/2018), 0.25 mg (08/21/2018) pegfilgrastim-cbqv (UDENYCA) injection 6 mg, 6 mg, Subcutaneous, Once, 5 of 5 cycles Administration: 6 mg (05/27/2018), 6 mg (06/17/2018), 6 mg (07/11/2018), 6 mg (08/01/2018), 6 mg (08/23/2018) CARBOplatin (PARAPLATIN) 380 mg in sodium chloride 0.9 % 250 mL chemo infusion, 380 mg (100 % of original dose 381 mg), Intravenous,  Once, 6 of 6 cycles Dose modification:   (original dose 381 mg, Cycle 1),   (original dose 309.5 mg, Cycle 2), 307.5 mg (original dose 309.5 mg, Cycle 5) Administration: 380 mg (05/03/2018), 310 mg (05/24/2018), 310 mg (06/14/2018), 340 mg (07/09/2018), 310 mg (07/30/2018), 350 mg (  08/21/2018) PACLitaxel (TAXOL) 330 mg in sodium chloride 0.9 % 500 mL chemo infusion (> 56m/m2), 175 mg/m2 = 330 mg (100 % of original dose 175 mg/m2), Intravenous,  Once, 6 of 6 cycles Dose modification: 175 mg/m2 (original dose 175  mg/m2, Cycle 1, Reason: Patient Age) Administration: 330 mg (05/03/2018), 330 mg (05/24/2018), 330 mg (06/14/2018), 330 mg (07/09/2018), 330 mg (07/30/2018), 330 mg (08/21/2018)  for chemotherapy treatment.    05/24/2018 - 05/06/2021 Chemotherapy   Patient is on Treatment Plan : HEAD/NECK Pembrolizumab/Taxol/Carboplatin Q21D     07/05/2021 -  Chemotherapy   Patient is on Treatment Plan : LUNG Gemcitabine D1,8 q21d     Squamous cell lung cancer, left ([facility]  06/14/2018 Initial Diagnosis   Squamous cell lung cancer, left ([facility])     CANCER STAGING:  Cancer Staging  Oropharyngeal carcinoma ([facility] Staging form: Pharynx - Oropharynx, AJCC 7th Edition - Clinical: Stage IVA (T4a, N2b, M0) - Unsigned   INTERVAL HISTORY:  Alexander [name] a 71y.o. male, seen for follow-up of metastatic prostate cancer and lung cancer.  He reports that he is tolerating 4 tablets of Abiraterone very well.  Reports that his breathing is getting better.  Requests refill of the inhaler which she uses at nighttime.  Had some constipation since the start of Abiraterone.  Energy levels are 50%.  Knee pains are [name].  REVIEW OF SYSTEMS:  Review of Systems  Constitutional:  Negative for appetite change, fatigue and unexpected weight change.  Respiratory:  Positive for shortness of breath. Negative for cough.   Gastrointestinal:  Positive for constipation. Negative for nausea.  Genitourinary:  Negative for difficulty urinating.   Musculoskeletal:  Positive for arthralgias (7/10 bilateral knee pain).  All other systems reviewed and are negative.   PAST MEDICAL/SURGICAL HISTORY:  Past Medical History:  Diagnosis Date   GERD (gastroesophageal reflux disease)    Mass of neck    dx. oropharyngeal squamous cell carcinoma- Chemo. radiation planned   [place] ([facility] 07/28/2014   dx. 3 weeks ago.- [facility]. [facility] [name]   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[facility] 08/06/2014   SCCa of Left BOT    Past Surgical History:  Procedure Laterality Date   BIOPSY  01/15/2018   Procedure: BIOPSY;  Surgeon: Alexander Binder MD;  [facility];  Service: Endoscopy;;  gastric   COLONOSCOPY N/A 03/13/2016   Procedure: COLONOSCOPY;  Surgeon: SDanie Binder MD;  [facility];  Service: Endoscopy;  Laterality: N/A;  2:15 PM   CYSTOSCOPY N/A 08/29/2021   Procedure: CYSTOSCOPY;  Surgeon: MCleon Gustin MD;  Location: [facility];  Service: Urology;  Laterality: N/A;   CYSTOSCOPY WITH INSERTION OF UROLIFT N/A 06/27/2021   Procedure: CYSTOSCOPY WITH INSERTION OF UROLIFT;  Surgeon: MCleon Gustin MD;  Location: [facility];  Service: Urology;  Laterality: N/A;   ESOPHAGOGASTRODUODENOSCOPY (EGD) WITH PROPOFOL N/A 08/17/2014   Procedure: ESOPHAGOGASTRODUODENOSCOPY (EGD) WITH PROPOFOL (procedure #1);  Surgeon: MAviva SignsMd, MD;  Location: [facility];  Service: General;  Laterality: N/A;   ESOPHAGOGASTRODUODENOSCOPY (EGD) WITH PROPOFOL N/A 01/15/2018   Procedure: ESOPHAGOGASTRODUODENOSCOPY (EGD) WITH PROPOFOL;  Surgeon: Alexander Binder MD;  [facility];  Service: Endoscopy;  Laterality: N/A;  9:30am   MULTIPLE EXTRACTIONS WITH ALVEOLOPLASTY N/A 08/12/2014   Procedure: Extraction of tooth #'s 6,17,22,23,24,25,26,27 with alveoloplasty;  Surgeon: RLenn Cal DDS;  Location: [facility];  Service: Oral Surgery;  Laterality: N/A;   PANENDOSCOPY N/A  08/06/2014   Procedure: PANENDOSCOPY WITH BIOPSY;  Surgeon: Leta Baptist, MD;  Location: [facility];  Service: ENT;  Laterality: N/A;   PEG PLACEMENT Left 08/17/14   PEG PLACEMENT N/A 08/17/2014   Procedure: PERCUTANEOUS ENDOSCOPIC GASTROSTOMY (PEG) PLACEMENT (procedure #1);  Surgeon: Aviva Signs Md, MD;  Location: [facility];  Service: General;  Laterality: N/A;   PORT-A-CATH REMOVAL Right 07/17/2016   Procedure: MINOR REMOVAL PORT-A-CATH;  Surgeon: Aviva Signs, MD;  Location: [facility];  Service: General;  Laterality: Right;    PORTACATH PLACEMENT Right 08/17/14   PORTACATH PLACEMENT Right 08/17/2014   Procedure: INSERTION PORT-A-CATH (procedure #2);  Surgeon: Aviva Signs Md, MD;  Location[facility];  Service: General;  Laterality: Right;   PORTACATH PLACEMENT Left 04/26/2018   Procedure: INSERTION PORT-A-CATH (attached catheter in left subclavian);  Surgeon: Aviva Signs, MD;  Location[facility];  Service: General;  Laterality: Left;   SAVORY DILATION N/A 01/15/2018   Procedure: SAVORY DILATION;  Surgeon: Alexander Binder, MD;  [facility];  Service: Endoscopy;  Laterality: N/A;   TRANSURETHRAL RESECTION OF PROSTATE N/A 08/29/2021   Procedure: TRANSURETHRAL RESECTION OF THE PROSTATE (TURP);  Surgeon: Cleon Gustin, MD;  Location[facility];  Service: Urology;  Laterality: N/A;   VIDEO BRONCHOSCOPY WITH ENDOBRONCHIAL ULTRASOUND N/A 04/15/2018   Procedure: VIDEO BRONCHOSCOPY WITH ENDOBRONCHIAL ULTRASOUND;  Surgeon: Melrose Nakayama, MD;  Location: [facility] OR;  Service: Thoracic;  Laterality: N/A;    SOCIAL HISTORY:  Social History   Socioeconomic History   Marital status: Legally Separated    Spouse name: Not on file   Number of children: 5   Years of education: Not on file   Highest education level: Not on file  Occupational History   Not on file  Tobacco Use   Smoking status: Former    Packs/day: 0.50    Years: 30.00    Total pack years: 15.00    Types: Cigarettes    Quit date: 07/22/2014    Years since quitting: 7.5   Smokeless tobacco: Never  Vaping Use   Vaping Use: Never used  Substance and Sexual Activity   Alcohol use: Not Currently    Alcohol/week: 0.0 standard drinks of alcohol    Comment: None currently (11/09/17); previously 1-2 beers on the weekend   Drug use: No   Sexual activity: Yes  Other Topics Concern   Not on file  Social History Narrative   Not on file   Social Determinants of Health   Financial Resource Strain: Low Risk  (02/12/2020)   Overall Financial Resource  Strain (CARDIA)    Difficulty of Paying Living Expenses: Not hard at all  Food Insecurity: No Food Insecurity (12/29/2021)   Hunger Vital Sign    Worried About Running Out of Food in the Last Year: Never true    Ran Out of Food in the Last Year: Never true  Transportation Needs: No Transportation Needs (12/29/2021)   PRAPARE - Hydrologist (Medical): No    Lack of Transportation (Non-Medical): No  Physical Activity: Inactive (02/12/2020)   Exercise Vital Sign    Days of Exercise per Week: 0 days    Minutes of Exercise per Session: 0 min  Stress: No Stress Concern Present (02/12/2020)   [place]    Feeling of Stress : Not at all  Social Connections: Moderately Isolated (02/12/2020)   Social Connection and Isolation Panel [NHANES]  Frequency of Communication with Friends and Family: More than three times a week    Frequency of Social Gatherings with Friends and Family: Twice a week    Attends Religious Services: Never    Marine scientist or Organizations: No    Attends Archivist Meetings: Never    Marital Status: Married  Human resources officer Violence: Not At Risk (12/29/2021)   Humiliation, Afraid, Rape, and Kick questionnaire    Fear of Current or Ex-Partner: No    Emotionally Abused: No    Physically Abused: No    Sexually Abused: No    FAMILY HISTORY:  Family History  Problem Relation Age of Onset   Colon cancer Neg Hx    Gastric cancer Neg Hx    Esophageal cancer Neg Hx     CURRENT MEDICATIONS:  Current Outpatient Medications  Medication Sig Dispense Refill   abiraterone acetate (ZYTIGA) 250 MG tablet Take 4 tablets (1,000 mg total) by mouth daily. Take on an empty stomach 1 hour before or 2 hours after a meal 120 tablet 3   albuterol (VENTOLIN HFA) 108 (90 Base) MCG/ACT inhaler Inhale 2 puffs into the lungs every 6 (six) hours as needed for wheezing or shortness  of breath. 8 g 2   Calcium Carb-Cholecalciferol (CALCIUM 500 + D PO) Take by mouth.     Gemcitabine HCl (GEMZAR IV) Inject into the vein once a week. Days 1 & 8 q 21 days     guaiFENesin-dextromethorphan (ROBITUSSIN DM) 100-10 MG/5ML syrup Take 10 mLs by mouth every 8 (eight) hours as needed for cough. 118 mL 0   HYDROcodone-acetaminophen (NORCO) 10-325 MG tablet TAKE 1 TABLET EVERY 12 HOURS AS NEEDED. 60 tablet 0   levothyroxine (SYNTHROID) 88 MCG tablet Take 1 tablet (88 mcg total) by mouth daily before breakfast. 30 tablet 3   naproxen sodium (ALEVE) 220 MG tablet Take 220 mg by mouth daily as needed (pain.).     omeprazole (PRILOSEC) 20 MG capsule Take 1 capsule (20 mg total) by mouth daily. 30 capsule 3   predniSONE (DELTASONE) 5 MG tablet Take 1 tablet (5 mg total) by mouth daily with breakfast. 90 tablet 0   sildenafil (VIAGRA) 100 MG tablet Take 1 tablet (100 mg total) by mouth as needed for erectile dysfunction. 10 tablet 6   tamsulosin (FLOMAX) 0.4 MG CAPS capsule Take 1 capsule (0.4 mg total) by mouth 2 (two) times daily. 60 capsule 11   No current facility-administered medications for this visit.   Facility-Administered Medications Ordered in Other Visits  Medication Dose Route Frequency Provider Last Rate Last Admin   sodium chloride flush (NS) 0.9 % injection 10 mL  10 mL Intravenous PRN Derek Jack, MD   10 mL at 02/08/22 1416    ALLERGIES:  No Known Allergies  PHYSICAL EXAM:  Performance status (ECOG): 1 - Symptomatic but completely ambulatory  There were no vitals filed for this visit. Wt Readings from Last 3 Encounters:  02/08/22 127 lb 3.3 oz (57.7 kg)  01/04/22 125 lb (56.7 kg)  12/29/21 118 lb 8 oz (53.8 kg)   Physical Exam Vitals reviewed.  Constitutional:      Appearance: Normal appearance.  Cardiovascular:     Rate and Rhythm: Normal rate and regular rhythm.     Pulses: Normal pulses.     Heart sounds: Normal heart sounds.  Pulmonary:      Effort: Pulmonary effort is normal.     Breath sounds: Normal breath sounds.  Neurological:     General: No focal deficit present.     Mental Status: He is alert and oriented Alexander person, place, and time.  Psychiatric:        Mood and Affect: Mood normal.        Behavior: Behavior normal.    LABORATORY DATA:  I have reviewed the labs as listed.     Latest Ref Rng & Units 01/04/2022    1:39 PM 12/30/2021    3:57 AM 12/29/2021    1:45 PM  CBC  WBC 4.0 - 10.5 K/uL 6.9  10.3  10.6   Hemoglobin 13.0 - 17.0 g/dL 10.7  11.2  12.6   Hematocrit 39.0 - 52.0 % 32.5  34.0  38.8   Platelets 150 - 400 K/uL 198  184  186       Latest Ref Rng & Units 01/04/2022    1:39 PM 12/29/2021    2:34 PM 12/29/2021    1:45 PM  CMP  Glucose 70 - 99 mg/dL 153  87  85   BUN 8 - 23 mg/dL 53  37  36   Creatinine 0.61 - 1.24 mg/dL 2.27  1.96  1.98   Sodium 135 - 145 mmol/L 137  138  137   Potassium 3.5 - 5.1 mmol/L 4.8  4.1  4.0   Chloride 98 - 111 mmol/L 105  103  103   CO2 22 - 32 mmol/L _0 Calcium 8.9 - 10.3 mg/dL 9.1  9.0  9.0   Total Protein 6.5 - 8.1 g/dL 7.2  7.1  7.1   Total Bilirubin 0.3 - 1.2 mg/dL 0.4  1.1  1.2   Alkaline Phos 38 - 126 U/L 76  92  92   AST 15 - 41 U/L 42  19  20   ALT 0 - 44 U/L 54  16  16     DIAGNOSTIC IMAGING:  I have independently reviewed the scans and discussed with the patient. No results found.   ASSESSMENT:  1.  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 -6 cycles of carboplatin, paclitaxel and pembrolizumab from 05/03/2018 through 08/21/2018. -Maintenance pembrolizumab started on 09/11/2018. -PET scan on 09/15/2019 showed interval decrease in hypermetabolic areas associated with tongue and floor of the mouth.  Hypermetabolic metastatic lymphadenopathy in the chest is [name].  No new sites seen. -PET scan on 03/15/2020 shows persistent, [name] hypermetabolism in the tongue/floor of  mouth.  Slight interval decrease in hypermetabolism with mediastinal/hilar adenopathy.  Persistent hypermetabolic focus in the right supraclavicular region.  New focus of hypermetabolic them identified in the right external iliac chain of pelvis with no discernible adenopathy on the CT. -CT CAP on 11/24/2020 showed left lower lobe lung nodule measuring 1.3 x 1.1 cm, previously 1.0 x 0.9 cm on CT scan from June.  Lesion was negative on PSMA PET scan.  [name] mediastinal lymph nodes. 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 - 6 cycles of carboplatin, paclitaxel and pembrolizumab from 01/12/2021 through 05/04/2021 with progression. - NGS test: No targetable mutations.  PD-L1 TPS is negative.  MSI-[name].  TMB-low. - Gemcitabine 2 weeks on/1 week off started on 07/05/2021.   2.  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  3.  Prostate cancer: - Evaluated by Dr.  McKenzie and biopsy was recommended.  Patient declined. - Last PSA was 99.7 on 10/03/2021. - We reviewed PSMA PET scan (10/13/2021): New multifocal bone mets in the pelvis, spine, bilateral ribs.  New mediastinal and hilar lymph nodes.  Increased right hydronephrosis related Alexander local extension of prostate carcinoma at the level of the right distal ureter/vesicoureteral junction.  Intense radiotracer avid tumor within the prostate gland.  Persistent radiotracer avid pelvic lymph nodes. - Degarelix loading dose started on 10/27/2021.  Abiraterone 1000 mg daily started on 11/03/2021.   4.  Left kidney mass: - Biopsy of the left kidney mass was consistent with metastatic squamous cell carcinoma.   PLAN:  1.  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   2.  Hypothyroidism: - Continue Synthroid 88 mcg.   Last TSH was within normal limits.   3.  Bilateral knee pains: - Continue hydrocodone twice daily as needed.   4.  Bone metastasis: - He has bone metastasis from prostate cancer. - We discussed about initiating him on denosumab Alexander decrease skeletal related events. - Calcium today is 8.8 with almond 3.0.  We discussed side effects in detail. - He may proceed with first dose today.   5.  Anemia/CKD: - Anemia from myelosuppression and CKD.  He has received Venofer in the past. - Recommend checking ferritin and iron panel next time.  6.  Prostate cancer Gleason 4+5=9: - He is tolerating Abiraterone and prednisone very well. - Last PSA improved Alexander 17.7 on 12/29/2021. - PSA today is pending.  Potassium is normal.  Blood pressure is slightly high at 139/83. - Continue Abiraterone 4 tablets daily along with prednisone 5 mg daily.  Continue Diaglyk's today. - RTC 4 weeks for follow-up with labs.  We will schedule CT CAP at next visit.   Orders placed this encounter:  No orders of the defined types were placed in this encounter.    Derek Jack, MD [place] [phone]

## 2022-02-09 ENCOUNTER — Encounter: Payer: Self-pay | Admitting: Hematology

## 2022-02-09 ENCOUNTER — Other Ambulatory Visit: Payer: Self-pay | Admitting: Pharmacist

## 2022-02-09 ENCOUNTER — Other Ambulatory Visit: Payer: Self-pay

## 2022-02-09 ENCOUNTER — Other Ambulatory Visit (HOSPITAL_COMMUNITY): Payer: Self-pay

## 2022-02-09 DIAGNOSIS — C61 Malignant neoplasm of prostate: Secondary | ICD-10-CM

## 2022-02-09 MED ORDER — ABIRATERONE ACETATE 250 MG PO TABS
1000.0000 mg | ORAL_TABLET | Freq: Every day | ORAL | 3 refills | Status: AC
  Filled 2022-02-09 – 2022-02-27 (×2): qty 120, 30d supply, fill #0
  Filled 2022-03-21 (×2): qty 120, 30d supply, fill #1

## 2022-02-13 ENCOUNTER — Other Ambulatory Visit (HOSPITAL_COMMUNITY): Payer: Self-pay

## 2022-02-21 ENCOUNTER — Other Ambulatory Visit (HOSPITAL_COMMUNITY): Payer: Self-pay

## 2022-02-23 ENCOUNTER — Other Ambulatory Visit: Payer: Self-pay

## 2022-02-27 ENCOUNTER — Other Ambulatory Visit (HOSPITAL_COMMUNITY): Payer: Self-pay

## 2022-02-28 ENCOUNTER — Other Ambulatory Visit (HOSPITAL_COMMUNITY): Payer: Self-pay

## 2022-03-03 ENCOUNTER — Other Ambulatory Visit: Payer: Self-pay

## 2022-03-03 MED ORDER — HYDROCODONE-ACETAMINOPHEN 10-325 MG PO TABS: ORAL_TABLET | ORAL | 0 refills | Status: DC

## 2022-03-08 ENCOUNTER — Telehealth: Payer: Self-pay

## 2022-03-08 NOTE — Telephone Encounter (Signed)
Oral Oncology Patient Advocate Encounter  Prior Authorization for Abiraterone has been approved.    PA# [id number]  Effective dates: 12.13.23 through 12.31.24    Berdine Addison, [place] Patient [place]  [phone] (phone) [phone] (fax) 03/08/2022 9:47 AM

## 2022-03-08 NOTE — Telephone Encounter (Signed)
Oral Oncology Patient Advocate Encounter   Received notification that prior authorization for Abiraterone is due for renewal.   PA submitted on 12.13.23  Key [id number]  Status is pending     Alexander [name], [place] Patient [place]  [phone] (phone) [phone] (fax) 03/08/2022 9:12 AM

## 2022-03-09 ENCOUNTER — Inpatient Hospital Stay: Payer: 59 | Attending: Hematology

## 2022-03-09 ENCOUNTER — Inpatient Hospital Stay: Payer: 59

## 2022-03-09 ENCOUNTER — Inpatient Hospital Stay (HOSPITAL_BASED_OUTPATIENT_CLINIC_OR_DEPARTMENT_OTHER): Payer: 59 | Admitting: Hematology

## 2022-03-09 VITALS — BP 92/61 | HR 99 | Temp 96.5°F | Resp 20 | Ht 72.0 in | Wt 121.8 lb

## 2022-03-09 DIAGNOSIS — C3492 Malignant neoplasm of unspecified part of left bronchus or lung: Secondary | ICD-10-CM

## 2022-03-09 DIAGNOSIS — Z923 Personal history of irradiation: Secondary | ICD-10-CM | POA: Insufficient documentation

## 2022-03-09 DIAGNOSIS — Z79890 Hormone replacement therapy (postmenopausal): Secondary | ICD-10-CM | POA: Diagnosis not present

## 2022-03-09 DIAGNOSIS — C61 Malignant neoplasm of prostate: Secondary | ICD-10-CM

## 2022-03-09 DIAGNOSIS — C7902 Secondary malignant neoplasm of left kidney and renal pelvis: Secondary | ICD-10-CM | POA: Diagnosis not present

## 2022-03-09 DIAGNOSIS — Z87891 Personal history of nicotine dependence: Secondary | ICD-10-CM | POA: Diagnosis not present

## 2022-03-09 DIAGNOSIS — M25561 Pain in right knee: Secondary | ICD-10-CM | POA: Insufficient documentation

## 2022-03-09 DIAGNOSIS — Z85810 Personal history of malignant neoplasm of tongue: Secondary | ICD-10-CM | POA: Diagnosis not present

## 2022-03-09 DIAGNOSIS — M25562 Pain in left knee: Secondary | ICD-10-CM | POA: Diagnosis not present

## 2022-03-09 DIAGNOSIS — N189 Chronic kidney disease, unspecified: Secondary | ICD-10-CM | POA: Diagnosis not present

## 2022-03-09 DIAGNOSIS — D509 Iron deficiency anemia, unspecified: Secondary | ICD-10-CM

## 2022-03-09 DIAGNOSIS — E039 Hypothyroidism, unspecified: Secondary | ICD-10-CM | POA: Insufficient documentation

## 2022-03-09 DIAGNOSIS — D631 Anemia in chronic kidney disease: Secondary | ICD-10-CM | POA: Diagnosis not present

## 2022-03-09 DIAGNOSIS — Z9221 Personal history of antineoplastic chemotherapy: Secondary | ICD-10-CM | POA: Diagnosis not present

## 2022-03-09 DIAGNOSIS — Z95828 Presence of other vascular implants and grafts: Secondary | ICD-10-CM

## 2022-03-09 DIAGNOSIS — Z79891 Long term (current) use of opiate analgesic: Secondary | ICD-10-CM | POA: Diagnosis not present

## 2022-03-09 DIAGNOSIS — Z7952 Long term (current) use of systemic steroids: Secondary | ICD-10-CM | POA: Insufficient documentation

## 2022-03-09 DIAGNOSIS — C7951 Secondary malignant neoplasm of bone: Secondary | ICD-10-CM | POA: Insufficient documentation

## 2022-03-09 LAB — IRON AND TIBC
Iron: 17 ug/dL — ABNORMAL LOW (ref 45–182)
Saturation Ratios: 7 % — ABNORMAL LOW (ref 17.9–39.5)
TIBC: 249 ug/dL — ABNORMAL LOW (ref 250–450)
UIBC: 232 ug/dL

## 2022-03-09 LAB — CBC WITH DIFFERENTIAL/PLATELET
Abs Immature Granulocytes: 0.02 10*3/uL (ref 0.00–0.07)
Basophils Absolute: 0 10*3/uL (ref 0.0–0.1)
Basophils Relative: 0 %
Eosinophils Absolute: 0 10*3/uL (ref 0.0–0.5)
Eosinophils Relative: 0 %
HCT: 33.8 % — ABNORMAL LOW (ref 39.0–52.0)
Hemoglobin: 10.7 g/dL — ABNORMAL LOW (ref 13.0–17.0)
Immature Granulocytes: 0 %
Lymphocytes Relative: 15 %
Lymphs Abs: 1.1 10*3/uL (ref 0.7–4.0)
MCH: 27.7 pg (ref 26.0–34.0)
MCHC: 31.7 g/dL (ref 30.0–36.0)
MCV: 87.6 fL (ref 80.0–100.0)
Monocytes Absolute: 0.7 10*3/uL (ref 0.1–1.0)
Monocytes Relative: 9 %
Neutro Abs: 5.6 10*3/uL (ref 1.7–7.7)
Neutrophils Relative %: 76 %
Platelets: 283 10*3/uL (ref 150–400)
RBC: 3.86 MIL/uL — ABNORMAL LOW (ref 4.22–5.81)
RDW: 17.3 % — ABNORMAL HIGH (ref 11.5–15.5)
WBC: 7.4 10*3/uL (ref 4.0–10.5)
nRBC: 0 % (ref 0.0–0.2)

## 2022-03-09 LAB — COMPREHENSIVE METABOLIC PANEL
ALT: 13 U/L (ref 0–44)
AST: 17 U/L (ref 15–41)
Albumin: 2.8 g/dL — ABNORMAL LOW (ref 3.5–5.0)
Alkaline Phosphatase: 72 U/L (ref 38–126)
Anion gap: 9 (ref 5–15)
BUN: 20 mg/dL (ref 8–23)
CO2: 22 mmol/L (ref 22–32)
Calcium: 6.6 mg/dL — ABNORMAL LOW (ref 8.9–10.3)
Chloride: 104 mmol/L (ref 98–111)
Creatinine, Ser: 1.97 mg/dL — ABNORMAL HIGH (ref 0.61–1.24)
GFR, Estimated: 35 mL/min — ABNORMAL LOW (ref >60)
Glucose, Bld: 85 mg/dL (ref 70–99)
Potassium: 3.8 mmol/L (ref 3.5–5.1)
Sodium: 135 mmol/L (ref 135–145)
Total Bilirubin: 0.8 mg/dL (ref 0.3–1.2)
Total Protein: 6.7 g/dL (ref 6.5–8.1)

## 2022-03-09 LAB — FERRITIN: Ferritin: 555 ng/mL — ABNORMAL HIGH (ref 24–336)

## 2022-03-09 LAB — PSA: Prostatic Specific Antigen: 9.1 ng/mL — ABNORMAL HIGH (ref 0.00–4.00)

## 2022-03-09 LAB — MAGNESIUM: Magnesium: 1.6 mg/dL — ABNORMAL LOW (ref 1.7–2.4)

## 2022-03-09 MED ORDER — DEGARELIX ACETATE 80 MG ~~LOC~~ SOLR
80.0000 mg | Freq: Once | SUBCUTANEOUS | Status: AC
  Administered 2022-03-09: 80 mg via SUBCUTANEOUS
  Filled 2022-03-09: qty 4

## 2022-03-09 MED ORDER — SODIUM CHLORIDE 0.9% FLUSH
10.0000 mL | Freq: Once | INTRAVENOUS | Status: AC
  Administered 2022-03-09: 10 mL via INTRAVENOUS

## 2022-03-09 MED ORDER — HEPARIN SOD (PORK) LOCK FLUSH 100 UNIT/ML IV SOLN
500.0000 [IU] | Freq: Once | INTRAVENOUS | Status: AC
  Administered 2022-03-09: 500 [IU] via INTRAVENOUS

## 2022-03-09 NOTE — Patient Instructions (Signed)
[facility]  Discharge Instructions: Thank you for choosing [facility] to provide your oncology and hematology care.  If you have a lab appointment with the [facility], please come in thru the Main Entrance and check in at the main information desk.  Wear comfortable clothing and clothing appropriate for easy access to any Portacath or PICC line.   We strive to give you quality time with your provider. You may need to reschedule your appointment if you arrive late (15 or more minutes).  Arriving late affects you and other patients whose appointments are after yours.  Also, if you miss three or more appointments without notifying the office, you may be dismissed from the clinic at the provider's discretion.      For prescription refill requests, have your pharmacy contact our office and allow 72 hours for refills to be completed.    Today you received the following chemotherapy and/or immunotherapy agents Firmagon injection, return as scheduled.    To help prevent nausea and vomiting after your treatment, we encourage you to take your nausea medication as directed.  BELOW ARE SYMPTOMS THAT SHOULD BE REPORTED IMMEDIATELY: *FEVER GREATER THAN 100.4 F (38 C) OR HIGHER *CHILLS OR SWEATING *NAUSEA AND VOMITING THAT IS NOT CONTROLLED WITH YOUR NAUSEA MEDICATION *UNUSUAL SHORTNESS OF BREATH *UNUSUAL BRUISING OR BLEEDING *URINARY PROBLEMS (pain or burning when urinating, or frequent urination) *BOWEL PROBLEMS (unusual diarrhea, constipation, pain near the anus) TENDERNESS IN MOUTH AND THROAT WITH OR WITHOUT PRESENCE OF ULCERS (sore throat, sores in mouth, or a toothache) UNUSUAL RASH, SWELLING OR PAIN  UNUSUAL VAGINAL DISCHARGE OR ITCHING   Items with * indicate a potential emergency and should be followed up as soon as possible or go to the Emergency Department if any problems should occur.  Please show the CHEMOTHERAPY ALERT CARD or IMMUNOTHERAPY ALERT  CARD at check-in to the Emergency Department and triage nurse.  Should you have questions after your visit or need to cancel or reschedule your appointment, please contact [facility] [phone]  and follow the prompts.  Office hours are 8:00 a.m. to 4:30 p.m. Monday - Friday. Please note that voicemails left after 4:00 p.m. may not be returned until the following business day.  We are closed weekends and major holidays. You have access to a nurse at all times for urgent questions. Please call the main number to the clinic [phone] and follow the prompts.  For any non-urgent questions, you may also contact your provider using MyChart. We now offer e-Visits for anyone 72 and older to request care online for non-urgent symptoms. For details visit [email or web address].   Also download the MyChart app! Go to the app store, search "MyChart", open the app, select [facility], and log in with your MyChart username and password.  Masks are optional in the cancer centers. If you would like for your care team to wear a mask while they are taking care of you, please let them know. You may have one support person who is at least 72 years old accompany you for your appointments.

## 2022-03-09 NOTE — Progress Notes (Signed)
Patient is taking Zytiga as prescribed.  He has not missed any doses and reports no side effects at this time.

## 2022-03-09 NOTE — Progress Notes (Signed)
Per Dr. Delton Coombes patient okay for firmagon injection. Hold Xgeva. Patient tolerated  Firmagon injection with no complaints voiced. Site clean and dry with no bruising or swelling noted at site. See MAR for details. Band aid applied.  Patient stable during and after injection. VSS with discharge and left in satisfactory condition with no s/s of distress noted.

## 2022-03-09 NOTE — Patient Instructions (Signed)
[place] at [facility] Discharge Instructions   You were seen and examined today by Dr. Delton Coombes.  He reviewed your lab work. Your calcium is low. You should double up on your calcium pills to take 1200 mg daily.   We will hold the Xgeva shot today due to your calcium level.  We will repeat a scan prior to your next appointment.   Return as scheduled.    Thank you for choosing [place] at [facility] to provide your oncology and hematology care.  To afford each patient quality time with our provider, please arrive at least 15 minutes before your scheduled appointment time.   If you have a lab appointment with the [place] please come in thru the Main Entrance and check in at the main information desk.  You need to re-schedule your appointment should you arrive 10 or more minutes late.  We strive to give you quality time with our providers, and arriving late affects you and other patients whose appointments are after yours.  Also, if you no show three or more times for appointments you may be dismissed from the clinic at the providers discretion.     Again, thank you for choosing [facility].  Our hope is that these requests will decrease the amount of time that you wait before being seen by our physicians.       _____________________________________________________________  Should you have questions after your visit to [facility], please contact our office at [phone] and follow the prompts.  Our office hours are 8:00 a.m. and 4:30 p.m. Monday - Friday.  Please note that voicemails left after 4:00 p.m. may not be returned until the following business day.  We are closed weekends and major holidays.  You do have access to a nurse 24-7, just call the main number to the clinic [phone] and do not press any options, hold on the line and a nurse will answer the phone.    For prescription refill  requests, have your pharmacy contact our office and allow 72 hours.    Due to Covid, you will need to wear a mask upon entering the hospital. If you do not have a mask, a mask will be given to you at the Main Entrance upon arrival. For doctor visits, patients may have 1 support person age 19 or older with them. For treatment visits, patients can not have anyone with them due to social distancing guidelines and our immunocompromised population.

## 2022-03-10 ENCOUNTER — Encounter: Payer: Self-pay | Admitting: Hematology

## 2022-03-10 ENCOUNTER — Other Ambulatory Visit: Payer: Self-pay

## 2022-03-10 NOTE — Progress Notes (Signed)
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)  07/27/2014 Imaging   CT neck- Advanced stage oropharyngeal cancer with necrotic adenopathy accounting for the left neck swelling.   07/28/2014 Initial Diagnosis   Oropharyngeal cancer   08/03/2014 Imaging   CT CAP- L supraclavicular lymphadenopathy is not completely visualized. This is better seen on the previous neck CT from 07/27/2014. Otherwise, no evidence for metastatic disease in the chest, abdomen, or pelvis.   08/03/2014 Imaging   Bone scan- Uptake at adjacent anterior LEFT 6, 7, 8 ribs likely representing trauma/fractures. Questionable nonspecific increased tracer localization at the posterior RIGHT 8th and 9th ribs, the adjacent nature which raises a a question of trauma as well   08/06/2014 Pathology Results   Dr. Benjamine Mola- Oropharynx, biopsy, Left - INVASIVE SQUAMOUS CELL CARCINOMA.   08/12/2014 Procedure   Dr. Enrique Sack- 1. Multiple extraction of tooth numbers 6, 17, 22, 23, 24, 25, 26, and 27. 3 Quadrants of alveoloplasty   08/17/2014 Pathology Results   PORT and G-TUBE placed by Dr. Carlis Stable.   08/26/2014 PET scan   Large hypermetabolic mass in the left base of tongue. Activity extends across midline to the right base tongue. 2. Intensely hypermetabolic left cervical metastatic lymph nodes. Lymph nodes extend from the left level II position to the left supraclavi   09/01/2014 - 09/22/2014  Chemotherapy   Concurrent chemoradiation with Cisplatin 100 mg/m2 x 2 cycles with Neulasta support. Held cycle #3 d/t renal toxicity.    09/03/2014 - 10/23/2014 Radiation Therapy   Treated in [place], IMRT Isidore Moos).  Base of tongue and bilat neck. Total dose: 70 Gy in 35 fractions. (of note, he did miss several treatments requiring BID dosing towards the end of treatment).    01/25/2015 PET scan   Near complete resolution of metabolic activity at the base of tongue. Minimal residual activity is likely post treatment effect. 2. Complete resolution of metabolic activity above LEFT cervical lymph nodes. No evidence of residual metabolically active    10/27/2334 Procedure   Port-a-cath removed [name])    05/03/2018 - 08/23/2018 Chemotherapy   The patient had dexamethasone (DECADRON) 4 MG tablet, 8 mg, Oral, Daily, 1 of 1 cycle, Start date: 05/01/2018, End date: 10/23/2018 palonosetron (ALOXI) injection 0.25 mg, 0.25 mg, Intravenous,  Once, 6 of 6 cycles Administration: 0.25 mg (05/03/2018), 0.25 mg (05/24/2018), 0.25 mg (06/14/2018), 0.25 mg (07/09/2018), 0.25 mg (07/30/2018), 0.25 mg (08/21/2018) pegfilgrastim-cbqv (UDENYCA) injection 6 mg, 6 mg, Subcutaneous, Once, 5 of 5 cycles Administration: 6 mg (05/27/2018), 6 mg (06/17/2018), 6 mg (07/11/2018), 6 mg (08/01/2018), 6 mg (08/23/2018) CARBOplatin (PARAPLATIN) 380 mg in sodium chloride 0.9 % 250 mL chemo infusion, 380 mg (100 % of original dose 381 mg), Intravenous,  Once, 6 of 6 cycles Dose modification:   (original dose 381 mg, Cycle 1),   (original dose 309.5 mg, Cycle 2), 307.5 mg (original dose 309.5 mg, Cycle 5) Administration: 380 mg (05/03/2018), 310 mg (05/24/2018), 310 mg (06/14/2018), 340 mg (07/09/2018), 310 mg (07/30/2018), 350 mg (  08/21/2018) PACLitaxel (TAXOL) 330 mg in sodium chloride 0.9 % 500 mL chemo infusion (> 38m/m2), 175 mg/m2 = 330 mg (100 % of original dose 175 mg/m2), Intravenous,  Once, 6 of 6 cycles Dose modification: 175 mg/m2 (original dose 175  mg/m2, Cycle 1, Reason: Patient Age) Administration: 330 mg (05/03/2018), 330 mg (05/24/2018), 330 mg (06/14/2018), 330 mg (07/09/2018), 330 mg (07/30/2018), 330 mg (08/21/2018)  for chemotherapy treatment.    05/24/2018 - 05/06/2021 Chemotherapy   Patient is on Treatment Plan : HEAD/NECK Pembrolizumab/Taxol/Carboplatin Q21D     07/05/2021 -  Chemotherapy   Patient is on Treatment Plan : LUNG Gemcitabine D1,8 q21d     Squamous cell lung cancer, left ([facility]  06/14/2018 Initial Diagnosis   Squamous cell lung cancer, left ([facility])     CANCER STAGING:  Cancer Staging  Oropharyngeal carcinoma ([facility] Staging form: Pharynx - Oropharynx, AJCC 7th Edition - Clinical: Stage IVA (T4a, N2b, M0) - Unsigned   INTERVAL HISTORY:  Alexander [name] a 71y.o. male, seen for follow-up of metastatic prostate cancer and lung cancer.  He is tolerating 4 tablets of Abiraterone in the mornings very well.  He is also continuing prednisone daily.  Knee pains are well-controlled with hydrocodone.  He lost about 4 pounds since last visit and is drinking about 2 cans of boost per day.  Denies any falls.  Taking calcium 600 mg once daily.  REVIEW OF SYSTEMS:  Review of Systems  Constitutional:  Negative for appetite change, fatigue and unexpected weight change.  Respiratory:  Positive for shortness of breath. Negative for cough.   Gastrointestinal:  Negative for nausea.  Genitourinary:  Negative for difficulty urinating.   Musculoskeletal:  Positive for arthralgias (7/10 bilateral knee pain).  Psychiatric/Behavioral:  Positive for sleep disturbance.   All other systems reviewed and are negative.   PAST MEDICAL/SURGICAL HISTORY:  Past Medical History:  Diagnosis Date   GERD (gastroesophageal reflux disease)    Mass of neck    dx. oropharyngeal squamous cell carcinoma- Chemo. radiation planned   [place] 07/28/2014   dx. 3 weeks ago.- [facility] [place]   Squamous cell carcinoma  of base of tongue ([facility] 08/06/2014   SCCa of Left BOT   Past Surgical History:  Procedure Laterality Date   BIOPSY  01/15/2018   Procedure: BIOPSY;  Surgeon: FDanie Binder MD;  [facility];  Service: Endoscopy;;  gastric   COLONOSCOPY N/A 03/13/2016   Procedure: COLONOSCOPY;  Surgeon: SDanie Binder MD;  [facility];  Service: Endoscopy;  Laterality: N/A;  2:15 PM   CYSTOSCOPY N/A 08/29/2021   Procedure: CYSTOSCOPY;  Surgeon: MCleon Gustin MD;  Location: [facility] ORS;  Service: Urology;  Laterality: N/A;   CYSTOSCOPY WITH INSERTION OF UROLIFT N/A 06/27/2021   Procedure: CYSTOSCOPY WITH INSERTION OF UROLIFT;  Surgeon: MCleon Gustin MD;  Location: [facility];  Service: Urology;  Laterality: N/A;   ESOPHAGOGASTRODUODENOSCOPY (EGD) WITH PROPOFOL N/A 08/17/2014   Procedure: ESOPHAGOGASTRODUODENOSCOPY (EGD) WITH PROPOFOL (procedure #1);  Surgeon: MAviva SignsMd, MD;  Location: [facility];  Service: General;  Laterality: N/A;   ESOPHAGOGASTRODUODENOSCOPY (EGD) WITH PROPOFOL N/A 01/15/2018   Procedure: ESOPHAGOGASTRODUODENOSCOPY (EGD) WITH PROPOFOL;  Surgeon: FDanie Binder MD;  [facility]: [facility];  Service: Endoscopy;  Laterality: N/A;  9:30am   MULTIPLE EXTRACTIONS WITH ALVEOLOPLASTY N/A 08/12/2014   Procedure: Extraction of tooth #'s 6,17,22,23,24,25,26,27 with alveoloplasty;  Surgeon: RLenn Cal DDS;  Location: [facility];  Service: Oral Surgery;  Laterality: N/A;   PANENDOSCOPY N/A 08/06/2014   Procedure: PANENDOSCOPY WITH BIOPSY;  Surgeon: Leta Baptist, MD;  Location: [facility];  Service: ENT;  Laterality: N/A;   PEG PLACEMENT Left 08/17/14   PEG PLACEMENT N/A 08/17/2014   Procedure: PERCUTANEOUS ENDOSCOPIC GASTROSTOMY (PEG) PLACEMENT (procedure #1);  Surgeon: Aviva Signs Md, MD;  Location: [facility];  Service: General;  Laterality: N/A;   PORT-A-CATH REMOVAL Right 07/17/2016   Procedure: MINOR REMOVAL PORT-A-CATH;  Surgeon: Aviva Signs, MD;  Location:  [facility];  Service: General;  Laterality: Right;   PORTACATH PLACEMENT Right 08/17/14   PORTACATH PLACEMENT Right 08/17/2014   Procedure: INSERTION PORT-A-CATH (procedure #2);  Surgeon: Aviva Signs Md, MD;  Location: [facility];  Service: General;  Laterality: Right;   PORTACATH PLACEMENT Left 04/26/2018   Procedure: INSERTION PORT-A-CATH (attached catheter in left subclavian);  Surgeon: Aviva Signs, MD;  Location: [facility];  Service: General;  Laterality: Left;   SAVORY DILATION N/A 01/15/2018   Procedure: SAVORY DILATION;  Surgeon: Danie Binder, MD;  [facility];  Service: Endoscopy;  Laterality: N/A;   TRANSURETHRAL RESECTION OF PROSTATE N/A 08/29/2021   Procedure: TRANSURETHRAL RESECTION OF THE PROSTATE (TURP);  Surgeon: Cleon Gustin, MD;  Location: [facility];  Service: Urology;  Laterality: N/A;   VIDEO BRONCHOSCOPY WITH ENDOBRONCHIAL ULTRASOUND N/A 04/15/2018   Procedure: VIDEO BRONCHOSCOPY WITH ENDOBRONCHIAL ULTRASOUND;  Surgeon: Melrose Nakayama, MD;  [facility] OR;  Service: Thoracic;  Laterality: N/A;    SOCIAL HISTORY:  Social History   Socioeconomic History   Marital status: Legally Separated    Spouse name: Not on file   Number of children: 5   Years of education: Not on file   Highest education level: Not on file  Occupational History   Not on file  Tobacco Use   Smoking status: Former    Packs/day: 0.50    Years: 30.00    Total pack years: 15.00    Types: Cigarettes    Quit date: 07/22/2014    Years since quitting: 7.6   Smokeless tobacco: Never  Vaping Use   Vaping Use: Never used  Substance and Sexual Activity   Alcohol use: Not Currently    Alcohol/week: 0.0 standard drinks of alcohol    Comment: None currently (11/09/17); previously 1-2 beers on the weekend   Drug use: No   Sexual activity: Yes  Other Topics Concern   Not on file  Social History Narrative   Not on file   Social Determinants of Health   Financial Resource Strain: Low  Risk  (02/12/2020)   Overall Financial Resource Strain (CARDIA)    Difficulty of Paying Living Expenses: Not hard at all  Food Insecurity: No Food Insecurity (12/29/2021)   Hunger Vital Sign    Worried About Running Out of Food in the Last Year: Never true    Ran Out of Food in the Last Year: Never true  Transportation Needs: No Transportation Needs (12/29/2021)   PRAPARE - Hydrologist (Medical): No    Lack of Transportation (Non-Medical): No  Physical Activity: Inactive (02/12/2020)   Exercise Vital Sign    Days of Exercise per Week: 0 days    Minutes of Exercise per Session: 0 min  Stress: No Stress Concern Present (02/12/2020)   [place]    Feeling of Stress : Not at all  Social Connections: Moderately Isolated (  02/12/2020)   Social Connection and Isolation Panel [NHANES]    Frequency of Communication with Friends and Family: More than three times a week    Frequency of Social Gatherings with Friends and Family: Twice a week    Attends Religious Services: Never    Marine scientist or Organizations: No    Attends Archivist Meetings: Never    Marital Status: Married  Human resources officer Violence: Not At Risk (12/29/2021)   Humiliation, Afraid, Rape, and Kick questionnaire    Fear of Current or Ex-Partner: No    Emotionally Abused: No    Physically Abused: No    Sexually Abused: No    FAMILY HISTORY:  Family History  Problem Relation Age of Onset   Colon cancer Neg Hx    Gastric cancer Neg Hx    Esophageal cancer Neg Hx     CURRENT MEDICATIONS:  Current Outpatient Medications  Medication Sig Dispense Refill   abiraterone acetate (ZYTIGA) 250 MG tablet Take 4 tablets (1,000 mg total) by mouth daily. Take on an empty stomach 1 hour before or 2 hours after a meal 120 tablet 3   albuterol (VENTOLIN HFA) 108 (90 Base) MCG/ACT inhaler Inhale 2 puffs into the lungs every  6 (six) hours as needed for wheezing or shortness of breath. 8 g 11   Calcium Carb-Cholecalciferol (CALCIUM 500 + D PO) Take by mouth.     Gemcitabine HCl (GEMZAR IV) Inject into the vein once a week. Days 1 & 8 q 21 days     guaiFENesin-dextromethorphan (ROBITUSSIN DM) 100-10 MG/5ML syrup Take 10 mLs by mouth every 8 (eight) hours as needed for cough. 118 mL 0   HYDROcodone-acetaminophen (NORCO) 10-325 MG tablet TAKE 1 TABLET EVERY 12 HOURS AS NEEDED. 60 tablet 0   levothyroxine (SYNTHROID) 88 MCG tablet Take 1 tablet (88 mcg total) by mouth daily before breakfast. 30 tablet 3   LORazepam (ATIVAN) 0.5 MG tablet Take by mouth.     naproxen sodium (ALEVE) 220 MG tablet Take 220 mg by mouth daily as needed (pain.).     omeprazole (PRILOSEC) 20 MG capsule Take 1 capsule (20 mg total) by mouth daily. 30 capsule 3   ondansetron (ZOFRAN) 8 MG tablet Take by mouth.     predniSONE (DELTASONE) 5 MG tablet Take 1 tablet (5 mg total) by mouth daily with breakfast. 90 tablet 0   sildenafil (VIAGRA) 100 MG tablet Take 1 tablet (100 mg total) by mouth as needed for erectile dysfunction. 10 tablet 6   tamsulosin (FLOMAX) 0.4 MG CAPS capsule Take 1 capsule (0.4 mg total) by mouth 2 (two) times daily. 60 capsule 11   No current facility-administered medications for this visit.    ALLERGIES:  No Known Allergies  PHYSICAL EXAM:  Performance status (ECOG): 1 - Symptomatic but completely ambulatory  There were no vitals filed for this visit. Wt Readings from Last 3 Encounters:  03/09/22 121 lb 12.8 oz (55.2 kg)  02/08/22 127 lb 3.3 oz (57.7 kg)  01/04/22 125 lb (56.7 kg)   Physical Exam Vitals reviewed.  Constitutional:      Appearance: Normal appearance.  Cardiovascular:     Rate and Rhythm: Normal rate and regular rhythm.     Pulses: Normal pulses.     Heart sounds: Normal heart sounds.  Pulmonary:     Effort: Pulmonary effort is normal.     Breath sounds: Normal breath sounds.  Neurological:      General: No focal  deficit present.     Mental Status: He is alert and oriented to person, place, and time.  Psychiatric:        Mood and Affect: Mood normal.        Behavior: Behavior normal.     LABORATORY DATA:  I have reviewed the labs as listed.     Latest Ref Rng & Units 03/09/2022   10:21 AM 02/08/2022    2:10 PM 01/04/2022    1:39 PM  CBC  WBC 4.0 - 10.5 K/uL 7.4  5.8  6.9   Hemoglobin 13.0 - 17.0 g/dL 10.7  10.7  10.7   Hematocrit 39.0 - 52.0 % 33.8  33.4  32.5   Platelets 150 - 400 K/uL 283  256  198       Latest Ref Rng & Units 03/09/2022   10:21 AM 02/08/2022    2:10 PM 01/04/2022    1:39 PM  CMP  Glucose 70 - 99 mg/dL 85  111  153   BUN 8 - 23 mg/dL 20  28  53   Creatinine 0.61 - 1.24 mg/dL 1.97  1.70  2.27   Sodium 135 - 145 mmol/L 135  132  137   Potassium 3.5 - 5.1 mmol/L 3.8  4.6  4.8   Chloride 98 - 111 mmol/L 104  99  105   CO2 22 - 32 mmol/L _0 Calcium 8.9 - 10.3 mg/dL 6.6  8.8  9.1   Total Protein 6.5 - 8.1 g/dL 6.7  6.9  7.2   Total Bilirubin 0.3 - 1.2 mg/dL 0.8  0.3  0.4   Alkaline Phos 38 - 126 U/L 72  123  76   AST 15 - 41 U/L 17  22  42   ALT 0 - 44 U/L 13  18  54     DIAGNOSTIC IMAGING:  I have independently reviewed the scans and discussed with the patient. No results found.   ASSESSMENT:  1.  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 -6 cycles of carboplatin, paclitaxel and pembrolizumab from 05/03/2018 through 08/21/2018. -Maintenance pembrolizumab started on 09/11/2018. -PET scan on 09/15/2019 showed interval decrease in hypermetabolic areas associated with tongue and floor of the mouth.  Hypermetabolic metastatic lymphadenopathy in the chest is stable.  No new sites seen. -PET scan on 03/15/2020 shows persistent, stable hypermetabolism in the tongue/floor of mouth.  Slight interval decrease in hypermetabolism with mediastinal/hilar adenopathy.   Persistent hypermetabolic focus in the right supraclavicular region.  New focus of hypermetabolic them identified in the right external iliac chain of pelvis with no discernible adenopathy on the CT. -CT CAP on 11/24/2020 showed left lower lobe lung nodule measuring 1.3 x 1.1 cm, previously 1.0 x 0.9 cm on CT scan from June.  Lesion was negative on PSMA PET scan.  Stable mediastinal lymph nodes. 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 - 6 cycles of carboplatin, paclitaxel and pembrolizumab from 01/12/2021 through 05/04/2021 with progression. - NGS test: No targetable mutations.  PD-L1 TPS is negative.  MSI-stable.  TMB-low. - Gemcitabine 2 weeks on/1 week off started on 07/05/2021.   2.  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  3.  Prostate cancer: - Evaluated by Dr. Alyson Ingles and biopsy was recommended.  Patient declined. -  Last PSA was 99.7 on 10/03/2021. - We reviewed PSMA PET scan (10/13/2021): New multifocal bone mets in the pelvis, spine, bilateral ribs.  New mediastinal and hilar lymph nodes.  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  Intense radiotracer avid tumor within the prostate gland.  Persistent radiotracer avid pelvic lymph nodes. - Degarelix loading dose started on 10/27/2021.  Abiraterone 1000 mg daily started on 11/03/2021.   4.  Left kidney mass: - Biopsy of the left kidney mass was consistent with metastatic squamous cell carcinoma.   PLAN:  1.  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   2.  Metastatic prostate cancer (4+5): - He is tolerating Abiraterone and prednisone very well. - PSA today improved to 9.1. - Continue Abiraterone 1000 mg daily and prednisone 5 mg  daily. - Continue degarelix monthly. - RTC 1 month with plan to repeat CT CAP with contrast.   3.  Bilateral knee pains: - Continue hydrocodone twice daily as needed.   4.  Bone metastasis: - He received first dose of Xgeva and tolerated it well. - Today calcium is 6.6 with albumin of 2.8.  Will hold his Xgeva. - He will increase calcium 600 mg to twice daily.   5.  Anemia/CKD: - Anemia from myelosuppression and CKD.  He has received Venofer in the past. - Ferritin today is 555, percent saturation 7.  Hemoglobin 10.7.  No indication for parenteral iron therapy.    Orders placed this encounter:  Orders Placed This Encounter  Procedures   CT CHEST [place], [place] [phone]

## 2022-03-12 ENCOUNTER — Other Ambulatory Visit: Payer: Self-pay

## 2022-03-21 ENCOUNTER — Other Ambulatory Visit: Payer: Self-pay

## 2022-03-27 ENCOUNTER — Encounter: Payer: Self-pay | Admitting: Hematology

## 2022-03-28 ENCOUNTER — Encounter: Payer: Self-pay | Admitting: Hematology

## 2022-03-28 ENCOUNTER — Other Ambulatory Visit (HOSPITAL_COMMUNITY): Payer: Self-pay

## 2022-04-03 ENCOUNTER — Other Ambulatory Visit: Payer: Self-pay

## 2022-04-03 ENCOUNTER — Emergency Department (HOSPITAL_COMMUNITY): Payer: 59

## 2022-04-03 ENCOUNTER — Inpatient Hospital Stay (HOSPITAL_COMMUNITY)
Admission: EM | Admit: 2022-04-03 | Discharge: 2022-04-08 | DRG: 181 | Disposition: A | Discharge status: Home / Self Care | Payer: 59 | Attending: Internal Medicine | Admitting: Internal Medicine

## 2022-04-03 ENCOUNTER — Inpatient Hospital Stay: Payer: 59 | Attending: Hematology

## 2022-04-03 ENCOUNTER — Ambulatory Visit (HOSPITAL_COMMUNITY)
Admission: RE | Admit: 2022-04-03 | Discharge: 2022-04-03 | Disposition: A | Discharge status: Home / Self Care | Payer: Medicare Other | Source: Ambulatory Visit | Attending: Hematology | Admitting: Hematology

## 2022-04-03 ENCOUNTER — Encounter (HOSPITAL_COMMUNITY): Payer: Self-pay | Admitting: *Deleted

## 2022-04-03 VITALS — BP 78/42 | HR 90 | Temp 97.0°F | Resp 20 | Wt 119.3 lb

## 2022-04-03 DIAGNOSIS — J90 Pleural effusion, not elsewhere classified: Secondary | ICD-10-CM | POA: Diagnosis present

## 2022-04-03 DIAGNOSIS — Z923 Personal history of irradiation: Secondary | ICD-10-CM | POA: Insufficient documentation

## 2022-04-03 DIAGNOSIS — J9809 Other diseases of bronchus, not elsewhere classified: Secondary | ICD-10-CM | POA: Diagnosis not present

## 2022-04-03 DIAGNOSIS — R627 Adult failure to thrive: Secondary | ICD-10-CM | POA: Diagnosis present

## 2022-04-03 DIAGNOSIS — D649 Anemia, unspecified: Secondary | ICD-10-CM | POA: Diagnosis not present

## 2022-04-03 DIAGNOSIS — Z85818 Personal history of malignant neoplasm of other sites of lip, oral cavity, and pharynx: Secondary | ICD-10-CM

## 2022-04-03 DIAGNOSIS — Z87891 Personal history of nicotine dependence: Secondary | ICD-10-CM | POA: Insufficient documentation

## 2022-04-03 DIAGNOSIS — N1832 Chronic kidney disease, stage 3b: Secondary | ICD-10-CM | POA: Diagnosis present

## 2022-04-03 DIAGNOSIS — R64 Cachexia: Secondary | ICD-10-CM | POA: Diagnosis not present

## 2022-04-03 DIAGNOSIS — C61 Malignant neoplasm of prostate: Secondary | ICD-10-CM

## 2022-04-03 DIAGNOSIS — Z9221 Personal history of antineoplastic chemotherapy: Secondary | ICD-10-CM

## 2022-04-03 DIAGNOSIS — D509 Iron deficiency anemia, unspecified: Secondary | ICD-10-CM | POA: Diagnosis not present

## 2022-04-03 DIAGNOSIS — N289 Disorder of kidney and ureter, unspecified: Secondary | ICD-10-CM | POA: Diagnosis not present

## 2022-04-03 DIAGNOSIS — Z85810 Personal history of malignant neoplasm of tongue: Secondary | ICD-10-CM | POA: Insufficient documentation

## 2022-04-03 DIAGNOSIS — Z66 Do not resuscitate: Secondary | ICD-10-CM | POA: Diagnosis present

## 2022-04-03 DIAGNOSIS — K219 Gastro-esophageal reflux disease without esophagitis: Secondary | ICD-10-CM | POA: Diagnosis present

## 2022-04-03 DIAGNOSIS — C7902 Secondary malignant neoplasm of left kidney and renal pelvis: Secondary | ICD-10-CM | POA: Insufficient documentation

## 2022-04-03 DIAGNOSIS — L03818 Cellulitis of other sites: Secondary | ICD-10-CM | POA: Diagnosis not present

## 2022-04-03 DIAGNOSIS — R0602 Shortness of breath: Secondary | ICD-10-CM | POA: Diagnosis present

## 2022-04-03 DIAGNOSIS — Z95828 Presence of other vascular implants and grafts: Secondary | ICD-10-CM | POA: Diagnosis not present

## 2022-04-03 DIAGNOSIS — Z79890 Hormone replacement therapy (postmenopausal): Secondary | ICD-10-CM | POA: Insufficient documentation

## 2022-04-03 DIAGNOSIS — Z79899 Other long term (current) drug therapy: Secondary | ICD-10-CM

## 2022-04-03 DIAGNOSIS — Z79891 Long term (current) use of opiate analgesic: Secondary | ICD-10-CM | POA: Insufficient documentation

## 2022-04-03 DIAGNOSIS — J9811 Atelectasis: Secondary | ICD-10-CM | POA: Diagnosis present

## 2022-04-03 DIAGNOSIS — L03116 Cellulitis of left lower limb: Secondary | ICD-10-CM | POA: Diagnosis present

## 2022-04-03 DIAGNOSIS — D631 Anemia in chronic kidney disease: Secondary | ICD-10-CM | POA: Diagnosis present

## 2022-04-03 DIAGNOSIS — E871 Hypo-osmolality and hyponatremia: Secondary | ICD-10-CM | POA: Diagnosis present

## 2022-04-03 DIAGNOSIS — C7951 Secondary malignant neoplasm of bone: Secondary | ICD-10-CM | POA: Diagnosis present

## 2022-04-03 DIAGNOSIS — L0390 Cellulitis, unspecified: Secondary | ICD-10-CM

## 2022-04-03 DIAGNOSIS — C3402 Malignant neoplasm of left main bronchus: Secondary | ICD-10-CM | POA: Diagnosis present

## 2022-04-03 DIAGNOSIS — I959 Hypotension, unspecified: Secondary | ICD-10-CM | POA: Diagnosis not present

## 2022-04-03 DIAGNOSIS — Z681 Body mass index (BMI) 19 or less, adult: Secondary | ICD-10-CM | POA: Diagnosis not present

## 2022-04-03 DIAGNOSIS — C3492 Malignant neoplasm of unspecified part of left bronchus or lung: Secondary | ICD-10-CM | POA: Diagnosis present

## 2022-04-03 DIAGNOSIS — N1330 Unspecified hydronephrosis: Secondary | ICD-10-CM | POA: Diagnosis present

## 2022-04-03 DIAGNOSIS — L03221 Cellulitis of neck: Secondary | ICD-10-CM | POA: Diagnosis present

## 2022-04-03 DIAGNOSIS — C109 Malignant neoplasm of oropharynx, unspecified: Secondary | ICD-10-CM | POA: Diagnosis present

## 2022-04-03 DIAGNOSIS — N183 Chronic kidney disease, stage 3 unspecified: Secondary | ICD-10-CM | POA: Diagnosis present

## 2022-04-03 DIAGNOSIS — R918 Other nonspecific abnormal finding of lung field: Secondary | ICD-10-CM | POA: Diagnosis not present

## 2022-04-03 DIAGNOSIS — N179 Acute kidney failure, unspecified: Secondary | ICD-10-CM | POA: Diagnosis not present

## 2022-04-03 DIAGNOSIS — N189 Chronic kidney disease, unspecified: Secondary | ICD-10-CM | POA: Insufficient documentation

## 2022-04-03 DIAGNOSIS — R471 Dysarthria and anarthria: Secondary | ICD-10-CM | POA: Diagnosis present

## 2022-04-03 DIAGNOSIS — E039 Hypothyroidism, unspecified: Secondary | ICD-10-CM | POA: Insufficient documentation

## 2022-04-03 DIAGNOSIS — Z7952 Long term (current) use of systemic steroids: Secondary | ICD-10-CM

## 2022-04-03 DIAGNOSIS — R1310 Dysphagia, unspecified: Secondary | ICD-10-CM | POA: Diagnosis present

## 2022-04-03 DIAGNOSIS — R54 Age-related physical debility: Secondary | ICD-10-CM | POA: Diagnosis present

## 2022-04-03 DIAGNOSIS — R079 Chest pain, unspecified: Secondary | ICD-10-CM

## 2022-04-03 DIAGNOSIS — R636 Underweight: Secondary | ICD-10-CM | POA: Diagnosis present

## 2022-04-03 DIAGNOSIS — R590 Localized enlarged lymph nodes: Secondary | ICD-10-CM | POA: Diagnosis present

## 2022-04-03 DIAGNOSIS — Z9079 Acquired absence of other genital organ(s): Secondary | ICD-10-CM

## 2022-04-03 LAB — COMPREHENSIVE METABOLIC PANEL
ALT: 9 U/L (ref 0–44)
AST: 16 U/L (ref 15–41)
Albumin: 2.6 g/dL — ABNORMAL LOW (ref 3.5–5.0)
Alkaline Phosphatase: 62 U/L (ref 38–126)
Anion gap: 13 (ref 5–15)
BUN: 24 mg/dL — ABNORMAL HIGH (ref 8–23)
CO2: 20 mmol/L — ABNORMAL LOW (ref 22–32)
Calcium: 7.4 mg/dL — ABNORMAL LOW (ref 8.9–10.3)
Chloride: 99 mmol/L (ref 98–111)
Creatinine, Ser: 1.93 mg/dL — ABNORMAL HIGH (ref 0.61–1.24)
GFR, Estimated: 36 mL/min — ABNORMAL LOW (ref >60)
Glucose, Bld: 104 mg/dL — ABNORMAL HIGH (ref 70–99)
Potassium: 3.9 mmol/L (ref 3.5–5.1)
Sodium: 132 mmol/L — ABNORMAL LOW (ref 135–145)
Total Bilirubin: 0.4 mg/dL (ref 0.3–1.2)
Total Protein: 6.7 g/dL (ref 6.5–8.1)

## 2022-04-03 LAB — CBC WITH DIFFERENTIAL/PLATELET
Abs Immature Granulocytes: 0.03 10*3/uL (ref 0.00–0.07)
Basophils Absolute: 0 10*3/uL (ref 0.0–0.1)
Basophils Relative: 0 %
Eosinophils Absolute: 0 10*3/uL (ref 0.0–0.5)
Eosinophils Relative: 0 %
HCT: 27.8 % — ABNORMAL LOW (ref 39.0–52.0)
Hemoglobin: 8.6 g/dL — ABNORMAL LOW (ref 13.0–17.0)
Immature Granulocytes: 0 %
Lymphocytes Relative: 9 %
Lymphs Abs: 0.7 10*3/uL (ref 0.7–4.0)
MCH: 27.8 pg (ref 26.0–34.0)
MCHC: 30.9 g/dL (ref 30.0–36.0)
MCV: 90 fL (ref 80.0–100.0)
Monocytes Absolute: 0.7 10*3/uL (ref 0.1–1.0)
Monocytes Relative: 9 %
Neutro Abs: 6.2 10*3/uL (ref 1.7–7.7)
Neutrophils Relative %: 82 %
Platelets: 212 10*3/uL (ref 150–400)
RBC: 3.09 MIL/uL — ABNORMAL LOW (ref 4.22–5.81)
RDW: 17.5 % — ABNORMAL HIGH (ref 11.5–15.5)
WBC: 7.7 10*3/uL (ref 4.0–10.5)
nRBC: 0 % (ref 0.0–0.2)

## 2022-04-03 LAB — TROPONIN I (HIGH SENSITIVITY)
Troponin I (High Sensitivity): 6 ng/L (ref <18)
Troponin I (High Sensitivity): 5 ng/L (ref <18)

## 2022-04-03 MED ORDER — ALBUTEROL SULFATE (2.5 MG/3ML) 0.083% IN NEBU: 2.5000 mg | INHALATION_SOLUTION | Freq: Four times a day (QID) | RESPIRATORY_TRACT | Status: DC | PRN

## 2022-04-03 MED ORDER — TAMSULOSIN HCL 0.4 MG PO CAPS
0.4000 mg | ORAL_CAPSULE | Freq: Two times a day (BID) | ORAL | Status: DC
  Administered 2022-04-03 – 2022-04-08 (×9): 0.4 mg via ORAL
  Filled 2022-04-03 (×10): qty 1

## 2022-04-03 MED ORDER — PANTOPRAZOLE SODIUM 40 MG PO TBEC
40.0000 mg | DELAYED_RELEASE_TABLET | Freq: Every day | ORAL | Status: DC
  Administered 2022-04-03 – 2022-04-08 (×5): 40 mg via ORAL
  Filled 2022-04-03 (×6): qty 1

## 2022-04-03 MED ORDER — LACTATED RINGERS IV BOLUS
1000.0000 mL | Freq: Once | INTRAVENOUS | Status: AC
  Administered 2022-04-03: 1000 mL via INTRAVENOUS

## 2022-04-03 MED ORDER — ACETAMINOPHEN 325 MG PO TABS: 650.0000 mg | ORAL_TABLET | Freq: Four times a day (QID) | ORAL | Status: DC | PRN

## 2022-04-03 MED ORDER — IOHEXOL 350 MG/ML SOLN
60.0000 mL | Freq: Once | INTRAVENOUS | Status: AC | PRN
  Administered 2022-04-03: 60 mL via INTRAVENOUS

## 2022-04-03 MED ORDER — ONDANSETRON HCL 4 MG PO TABS: 4.0000 mg | ORAL_TABLET | Freq: Four times a day (QID) | ORAL | Status: DC | PRN

## 2022-04-03 MED ORDER — SODIUM CHLORIDE 0.9 % IV BOLUS: 1000.0000 mL | Freq: Once | INTRAVENOUS | Status: DC

## 2022-04-03 MED ORDER — ACETAMINOPHEN 650 MG RE SUPP: 650.0000 mg | Freq: Four times a day (QID) | RECTAL | Status: DC | PRN

## 2022-04-03 MED ORDER — ONDANSETRON HCL 4 MG/2ML IJ SOLN: 4.0000 mg | Freq: Four times a day (QID) | INTRAMUSCULAR | Status: DC | PRN

## 2022-04-03 MED ORDER — SODIUM CHLORIDE 0.9 % IV SOLN: INTRAVENOUS | Status: DC

## 2022-04-03 MED ORDER — SODIUM CHLORIDE 0.9% FLUSH: 10.0000 mL | Freq: Once | INTRAVENOUS | Status: DC

## 2022-04-03 MED ORDER — PREDNISONE 5 MG PO TABS
5.0000 mg | ORAL_TABLET | Freq: Every day | ORAL | Status: DC
  Administered 2022-04-05 – 2022-04-08 (×4): 5 mg via ORAL
  Filled 2022-04-03 (×5): qty 1

## 2022-04-03 MED ORDER — ABIRATERONE ACETATE 250 MG PO TABS: 1000.0000 mg | ORAL_TABLET | Freq: Every day | ORAL | Status: DC

## 2022-04-03 MED ORDER — POLYETHYLENE GLYCOL 3350 17 G PO PACK: 17.0000 g | PACK | Freq: Every day | ORAL | Status: DC | PRN

## 2022-04-03 MED ORDER — LEVOTHYROXINE SODIUM 88 MCG PO TABS
88.0000 ug | ORAL_TABLET | Freq: Every day | ORAL | Status: DC
  Administered 2022-04-05 – 2022-04-08 (×4): 88 ug via ORAL
  Filled 2022-04-03 (×5): qty 1

## 2022-04-03 MED ORDER — HYDROCODONE-ACETAMINOPHEN 10-325 MG PO TABS
1.0000 | ORAL_TABLET | Freq: Four times a day (QID) | ORAL | Status: DC | PRN
  Administered 2022-04-06 – 2022-04-08 (×4): 1 via ORAL
  Filled 2022-04-03 (×4): qty 1

## 2022-04-03 NOTE — ED Provider Notes (Signed)
[facility] Provider Note   CSN: [id number] Arrival date & time: 04/03/22  1031     History  Chief Complaint  Patient presents with   Chest Pain    Alexander [name] is a 72 y.o. male.  Patient presents to the emergency department from the cancer center complaining of left-sided chest pain, shortness of breath, and left arm pain which is been ongoing for the past few days.  Patient was at the cancer center for a scan and a port flush this morning and they noticed that he was having low oxygen saturations.  He was started on 2 L/min of oxygen via nasal cannula and sent by wheelchair to the emergency department.  Patient states the pains been ongoing for at least 3 or 4 days at this time.  Patient is being treated for prostate cancer but denies any known metastasis.  Patient rates pain as severe, 8 out of 10 in severity.  Pain is described as sharp.  He does complain of tenderness when the chest is.  Touched.  Patient currently denying nausea, vomiting, abdominal pain.  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 Authorizing Provider  abiraterone acetate (ZYTIGA) 250 MG tablet Take 4 tablets (1,000 mg total) by mouth daily. Take on an empty stomach 1 hour before or 2 hours after a meal 02/09/22  Yes Derek Jack, MD  albuterol (VENTOLIN HFA) 108 (90 Base) MCG/ACT inhaler Inhale 2 puffs into the lungs every 6 (six) hours as needed for wheezing or shortness of breath. 02/08/22  Yes Derek Jack, MD  Calcium Carb-Cholecalciferol (CALCIUM 500 + D PO) Take by mouth.   Yes [provider]  levothyroxine (SYNTHROID) 88 MCG tablet Take 1 tablet (88 mcg total) by mouth daily before breakfast. 02/02/22   Yes Derek Jack, MD  omeprazole (PRILOSEC) 20 MG capsule Take 1 capsule (20 mg total) by mouth daily. 02/02/22  Yes Derek Jack, MD  predniSONE (DELTASONE) 5 MG tablet Take 1 tablet (5 mg total) by mouth daily with breakfast. 02/07/22  Yes Derek Jack, MD  sildenafil (VIAGRA) 100 MG tablet Take 1 tablet (100 mg total) by mouth as needed for erectile dysfunction. 10/21/21  Yes McKenzie, Candee Furbish, MD  tamsulosin (FLOMAX) 0.4 MG CAPS capsule Take 1 capsule (0.4 mg total) by mouth 2 (two) times daily. 07/01/21  Yes [place]lin, Berneice Heinrich, PA-C  Gemcitabine HCl (GEMZAR IV) Inject into the vein once a week. Days 1 & 8 q 21 days    [provider]  guaiFENesin-dextromethorphan (ROBITUSSIN DM) 100-10 MG/5ML syrup Take 10 mLs by mouth every 8 (eight) hours as needed for cough. Patient not taking: Reported on 04/03/2022 01/03/22   Derek Jack, MD  HYDROcodone-acetaminophen [facility]) 10-325 MG tablet TAKE 1 TABLET EVERY 12 HOURS AS NEEDED. 03/03/22   Tarri Abernethy M, PA-C  ondansetron (ZOFRAN) 8 MG tablet Take by mouth. Patient not taking: Reported on 04/03/2022 05/01/18   [provider]  prochlorperazine (COMPAZINE) 10 MG tablet Take 1 tablet (10 mg total) by mouth every 6 (six) hours as needed (Nausea or vomiting). Patient not taking: Reported on 11/13/2018 05/01/18 12/04/18  Zoila Shutter, MD      Allergies    Patient has no known allergies.  Review of Systems   Review of Systems  Constitutional:  Negative for fever.  Respiratory:  Positive for shortness of breath. Negative for cough.   Cardiovascular:  Positive for chest pain. Negative for leg swelling.  Gastrointestinal:  Negative for abdominal pain, constipation, nausea and vomiting.  Neurological:  Negative for headaches.    Physical Exam Updated Vital Signs BP (!) 102/58   Pulse 73   Temp 98.2 F (36.8 C)   Resp 16   Ht 6\' 1"  (1.854 m)   Wt 54.4 kg   SpO2 100%   BMI 15.83 kg/m   Physical Exam Vitals and nursing note reviewed.  Constitutional:      General: He is not in acute distress.    Appearance: He is well-developed.  HENT:     Head: Normocephalic and atraumatic.  Eyes:     Conjunctiva/sclera: Conjunctivae normal.  Cardiovascular:     Rate and Rhythm: Normal rate and regular rhythm.     Heart sounds: No murmur heard. Pulmonary:     Effort: Pulmonary effort is normal. No respiratory distress.     Breath sounds: Examination of the left-upper field reveals decreased breath sounds. Examination of the left-middle field reveals decreased breath sounds. Examination of the left-lower field reveals decreased breath sounds. Decreased breath sounds present.  Abdominal:     Palpations: Abdomen is soft.     Tenderness: There is no abdominal tenderness.  Musculoskeletal:        General: No swelling.     Cervical back: Neck supple.  Skin:    General: Skin is warm and dry.     Capillary Refill: Capillary refill takes less than 2 seconds.  Neurological:     Mental Status: He is alert.  Psychiatric:        Mood and Affect: Mood normal.     ED Results / Procedures / Treatments   Labs (all labs ordered are listed, but only abnormal results are displayed) Labs Reviewed  COMPREHENSIVE METABOLIC PANEL - Abnormal; Notable for the following components:      Result Value   Sodium 132 (*)    CO2 20 (*)    Glucose, Bld 104 (*)    BUN 24 (*)    Creatinine, Ser 1.93 (*)    Calcium 7.4 (*)    Albumin 2.6 (*)    GFR, Estimated 36 (*)    All other components within normal limits  CBC WITH DIFFERENTIAL/PLATELET - Abnormal; Notable for the following components:   RBC 3.09 (*)    Hemoglobin 8.6 (*)    HCT 27.8 (*)    RDW 17.5 (*)    All other components within normal limits  TROPONIN I (HIGH SENSITIVITY)  TROPONIN I (HIGH SENSITIVITY)    EKG None  Radiology CT Angio Chest PE W and/or Wo Contrast  Result Date: 04/03/2022 CLINICAL DATA:  History of lung  carcinoma, metastatic prostate carcinoma, chest pain and increased shortness of breath x3 days EXAM: CT ANGIOGRAPHY CHEST WITH CONTRAST TECHNIQUE: Multidetector CT imaging of the chest was performed using the standard protocol during bolus administration of intravenous contrast. Multiplanar CT image reconstructions and MIPs were obtained to evaluate the vascular anatomy. RADIATION DOSE REDUCTION: This exam was performed according to the departmental dose-optimization program which includes automated exposure control, adjustment of the mA and/or kV according to patient size and/or use of iterative reconstruction technique. CONTRAST:  104mL OMNIPAQUE IOHEXOL 350 MG/ML SOLN COMPARISON:  Previous CT done on 10/03/2021, chest radiograph done earlier today FINDINGS:  Cardiovascular: There is homogeneous enhancement in thoracic aorta. There are no intraluminal filling defects in pulmonary artery branches. There is homogeneous enhancement in thoracic aorta. Major branches of thoracic aorta appear patent. Scattered coronary artery calcifications are seen. Mediastinum/Nodes: There are enlarged lymph nodes in mediastinum with interval worsening. Largest of the nodes is noted in aortopulmonary window measuring 2.7 cm in short axis. There is air-fluid level in thoracic esophagus. Lungs/Pleura: There is complete atelectasis in left lung. Evaluation of left lung for infiltrates or nodules is limited by complete atelectasis. There is no demonstrable air in left main bronchus and its branches. There is soft tissue attenuation in the lumen of left main bronchus. There is large left pleural effusion. Scattered blebs and bullae are seen in right lung. There are no infiltrates or discrete nodules in right lung. There is pleural thickening in the right apex, more so in the anterior aspect which has not changed significantly. There is no pneumothorax. Upper Abdomen: There is fullness of collecting systems in both kidneys. Ureters are not  included in the study. There are abnormally enlarged lymph nodes in the retrocrural region and upper abdomen. There are few scattered low-density foci, possibly cysts or hemangiomas in the liver. Largest of these lesions measures 2.6 cm. Musculoskeletal: Slightly displaced fracture is seen in the posterior aspect of left twelfth rib. Sclerosis seen in the left eighth rib may suggest metastatic disease. There are a few sclerotic lesions in thoracic spine. Severe degenerative changes are noted in the lower cervical spine. Review of the MIP images confirms the above findings. IMPRESSION: There is no evidence of pulmonary embolism. There is no evidence of thoracic aortic dissection. Coronary artery disease. There is complete atelectasis of left lung. There is soft tissue density in the lumen of left main bronchus in the mediastinum. This may be due to large mucous plug in the left main bronchus or neoplastic infiltration. Endoscopic correlation should be considered. Large left pleural effusion. COPD. Right lung is clear. There is significant interval worsening of lymphadenopathy in mediastinum. There are abnormally enlarged lymph nodes in upper abdomen. Findings suggest progression of metastatic lymphadenopathy. There is air-fluid level in thoracic esophagus suggesting gastroesophageal reflux. There is slightly displaced fracture in the posterior left twelfth rib. There is fullness of collecting systems in both kidneys. This finding is not fully evaluated. Other findings as described in the body of the report. Electronically Signed   By: Elmer Picker M.D.   On: 04/03/2022 13:14   DG Chest 2 View  Result Date: 04/03/2022 CLINICAL DATA:  History of lung cancer with increased shortness of breath EXAM: CHEST - 2 VIEW COMPARISON:  Chest radiograph dated 12/29/2021 FINDINGS: Left chest wall port tip projects over the superior cavoatrial junction. Right lung is hyperinflated. Complete opacification of the left lung  with leftward deviation of the airways and mediastinum. Left lower lobe nodular mass is better evaluated on prior CT. No pneumothorax. The cardiac contours are obscured. The visualized skeletal structures are unremarkable. IMPRESSION: Complete opacification of the left lung with leftward deviation of the airways and mediastinum, likely due to atelectasis. Superimposed consolidation or pleural effusion can not be excluded. Electronically Signed   By: Darrin Nipper M.D.   On: 04/03/2022 11:19    Procedures Procedures    Medications Ordered in ED Medications  sodium chloride 0.9 % bolus 1,000 mL (1,000 mLs Intravenous Not Given 04/03/22 1156)  lactated ringers bolus 1,000 mL (0 mLs Intravenous Stopped 04/03/22 1436)  iohexol (OMNIPAQUE) 350 MG/ML injection  60 mL (60 mLs Intravenous Contrast Given 04/03/22 1236)    ED Course/ Medical Decision Making/ A&P                           Medical Decision Making Amount and/or Complexity of Data Reviewed Labs: ordered. Radiology: ordered.  Risk Prescription drug management.   This patient presents to the ED for concern of shortness of breath and chest pain, this involves an extensive number of treatment options, and is a complaint that carries with it a high risk of complications and morbidity.  The differential diagnosis includes pulmonary embolism, pleural effusion, pneumonia, ACS, and others   Co morbidities that complicate the patient evaluation  History of lung cancer, prostate cancer, oropharyngeal cancer, chronic kidney disease   Additional history obtained:  Additional history obtained from nursing from the cancer center External records from outside source obtained and reviewed including notes from oncology in December of this past year for evaluation due to squamous cell carcinoma of the left lung   Lab Tests:  I Ordered, and personally interpreted labs.  The pertinent results include: Sodium 132, creatinine at baseline, hemoglobin 8.6,  initial troponin 6   Imaging Studies ordered:  I ordered imaging studies including chest x-ray and CT angio chest PE study I independently visualized and interpreted imaging which showed  Complete opacification of the left lung with leftward deviation of  the airways and mediastinum, likely due to atelectasis. Superimposed  consolidation or pleural effusion can not be excluded.   There is no evidence of pulmonary embolism. There is no evidence of  thoracic aortic dissection. Coronary artery disease.    There is complete atelectasis of left lung. There is soft tissue  density in the lumen of left main bronchus in the mediastinum. This  may be due to large mucous plug in the left main bronchus or  neoplastic infiltration. Endoscopic correlation should be  considered. Large left pleural effusion.    COPD. Right lung is clear. There is significant interval worsening  of lymphadenopathy in mediastinum. There are abnormally enlarged  lymph nodes in upper abdomen. Findings suggest progression of  metastatic lymphadenopathy.    There is air-fluid level in thoracic esophagus suggesting  gastroesophageal reflux.    There is slightly displaced fracture in the posterior left twelfth  rib.    There is fullness of collecting systems in both kidneys. This  finding is not fully evaluated.    Other findings as described in the body of the report.   I agree with the radiologist interpretation   Cardiac Monitoring: / EKG:  The patient was maintained on a cardiac monitor.  I personally viewed and interpreted the cardiac monitored which showed an underlying rhythm of: Normal sinus rhythm   Consultations Obtained:  I requested consultation with the hospitalist,  and discussed lab and imaging findings as well as pertinent plan - they recommend: Admission. Will plan on admission to [facility] or [facility] Requested consultation with pulmonology. Dr. Halford Chessman request medicine admission to either  [facility] or [facility] for likely bronchoscopy   Problem List / ED Course / Critical interventions / Medication management   I ordered medication including lactated ringers for fluid resuscitation  Reevaluation of the patient after these medicines showed that the patient improved I have reviewed the patients home medicines and have made adjustments as needed   Test / Admission - Considered:  Patient will need admission due to complete atelectasis of  the left lung with large left sided pleural effusion.  There is concern about mucous plugging and the patient would benefit from a bronchoscopy.        Final Clinical Impression(s) / ED Diagnoses Final diagnoses:  Pleural effusion  Atelectasis of left lung  Chest pain, unspecified type  Shortness of breath    Rx / DC Orders ED Discharge Orders     None         Ronny Bacon 04/03/22 1722    Kommor, Debe Coder, MD 04/03/22 1851    Teressa Lower, MD 04/04/22 [phone]

## 2022-04-03 NOTE — Assessment & Plan Note (Signed)
Follows with Dr. 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 -He is currently on treatment for prostate cancer, Zytiga and prednisone and monthly Degarelix

## 2022-04-03 NOTE — Assessment & Plan Note (Signed)
CKD 3B.  Creatinine 1.93 at baseline.

## 2022-04-03 NOTE — Plan of Care (Signed)

## 2022-04-03 NOTE — Progress Notes (Signed)
Patient arrived to [facility] for labs. Patient's BP on arrival 78/74, O2 on arrival 78% patient not on oxygen at home, O2 improved to 94% on 2L. Patient reports redness and tenderness around neck to left side that started 3-4 days ago. Patient evaluated by Dr. Delton Coombes and suggested to go to ED. ED nurse Arta Silence, RN called report by Jene Every RN. Patient discharged to ED in satisfactory condition via wheelchair via tech & RN with O2.

## 2022-04-03 NOTE — H&P (Addendum)
History and Physical    Alexander [name] SEG:[id number] DOB: Mar 11, 1951 DOA: 04/03/2022  PCP: Lemmie Evens, MD   Patient coming from: Home  I have personally briefly reviewed patient's old medical records in [place]  Chief Complaint: Left chest pain  HPI: Alexander [name] is a 72 y.o. male with medical history significant for oropharyngeal squamous cell cancer, prostate cancer, CKD 3. Presents to cancer center reports of hypotension blood pressure was 74, O2 sats of 78% on room air.  He also reported left neck and chest pain. Patient tells me symptoms started about 3 to 4 days ago, with pain to the left side of his neck, to his upper chest and left upper arm.  Symptoms have been persistent.  He denies difficulty breathing.  No lower extremity swelling.  Quit smoking cigarettes about 7 years ago. He otherwise denies any other complaints.  No vomiting no loose stools.  ED Course: Blood pressure initially 74/52 improved to 103/58, after 2 L bolus.  O2 sats 100% on room air.  Temperature 98.2.  Respiratory rate 15-25. Chest x-ray showed complete opacification of left lung with left fourth deviation of the airways and mediastinum likely due to atelectasis Subsequent CTA chest-complete atelectasis of left lung, soft tissue in lumen of left main bronchus in the mediastinum, -mucous plug or neoplastic infiltration. EDP to Dr. Halford Chessman, recommended admission to [facility] or [facility], reconsult pulmonology on arrival for bronchoscopy.  Review of Systems: As per HPI all other systems reviewed and negative.  Past Medical History:  Diagnosis Date   GERD (gastroesophageal reflux disease)    Mass of neck    dx. oropharyngeal squamous cell carcinoma- Chemo. radiation planned   Oropharyngeal cancer ([place]) 07/28/2014   dx. 3 weeks ago.- Dr. Oneal [facility] center [place], [place].   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[place]) 08/06/2014   SCCa of Left BOT    Past Surgical History:  Procedure  Laterality Date   BIOPSY  01/15/2018   Procedure: BIOPSY;  Surgeon: Danie Binder, MD;  [facility];  Service: Endoscopy;;  gastric   COLONOSCOPY N/A 03/13/2016   Procedure: COLONOSCOPY;  Surgeon: Danie Binder, MD;  [facility];  Service: Endoscopy;  Laterality: N/A;  2:15 PM   CYSTOSCOPY N/A 08/29/2021   Procedure: CYSTOSCOPY;  Surgeon: Cleon Gustin, MD;  Location: [facility];  Service: Urology;  Laterality: N/A;   CYSTOSCOPY WITH INSERTION OF UROLIFT N/A 06/27/2021   Procedure: CYSTOSCOPY WITH INSERTION OF UROLIFT;  Surgeon: Cleon Gustin, MD;  Location: [facility];  Service: Urology;  Laterality: N/A;   ESOPHAGOGASTRODUODENOSCOPY (EGD) WITH PROPOFOL N/A 08/17/2014   Procedure: ESOPHAGOGASTRODUODENOSCOPY (EGD) WITH PROPOFOL (procedure #1);  Surgeon: Aviva Signs Md, MD;  Location: [facility];  Service: General;  Laterality: N/A;   ESOPHAGOGASTRODUODENOSCOPY (EGD) WITH PROPOFOL N/A 01/15/2018   Procedure: ESOPHAGOGASTRODUODENOSCOPY (EGD) WITH PROPOFOL;  Surgeon: Danie Binder, MD;  [facility];  Service: Endoscopy;  Laterality: N/A;  9:30am   MULTIPLE EXTRACTIONS WITH ALVEOLOPLASTY N/A 08/12/2014   Procedure: Extraction of tooth #'s 6,17,22,23,24,25,26,27 with alveoloplasty;  Surgeon: Lenn Cal, DDS;  Location: [facility];  Service: Oral Surgery;  Laterality: N/A;   PANENDOSCOPY N/A 08/06/2014   Procedure: PANENDOSCOPY WITH BIOPSY;  Surgeon: Leta Baptist, MD;  Location: [place];  Service: ENT;  Laterality: N/A;   PEG PLACEMENT Left 08/17/14   PEG PLACEMENT N/A 08/17/2014   Procedure: PERCUTANEOUS ENDOSCOPIC GASTROSTOMY (PEG) PLACEMENT (procedure #1);  Surgeon: Aviva Signs Md, MD;  Location[facility];  Service: General;  Laterality: N/A;   PORT-A-CATH REMOVAL Right 07/17/2016   Procedure: MINOR REMOVAL PORT-A-CATH;  Surgeon: Aviva Signs, MD;  [facility];  Service: General;  Laterality: Right;   PORTACATH PLACEMENT Right 08/17/14    PORTACATH PLACEMENT Right 08/17/2014   Procedure: INSERTION PORT-A-CATH (procedure #2);  Surgeon: Aviva Signs Md, MD;  [facility];  Service: General;  Laterality: Right;   PORTACATH PLACEMENT Left 04/26/2018   Procedure: INSERTION PORT-A-CATH (attached catheter in left subclavian);  Surgeon: Aviva Signs, MD;  [facility];  Service: General;  Laterality: Left;   SAVORY DILATION N/A 01/15/2018   Procedure: SAVORY DILATION;  Surgeon: Danie Binder, MD;  [facility];  Service: Endoscopy;  Laterality: N/A;   TRANSURETHRAL RESECTION OF PROSTATE N/A 08/29/2021   Procedure: TRANSURETHRAL RESECTION OF THE PROSTATE (TURP);  Surgeon: Cleon Gustin, MD;  Location[facility];  Service: Urology;  Laterality: N/A;   VIDEO BRONCHOSCOPY WITH ENDOBRONCHIAL ULTRASOUND N/A 04/15/2018   Procedure: VIDEO BRONCHOSCOPY WITH ENDOBRONCHIAL ULTRASOUND;  Surgeon: Melrose Nakayama, MD;  Location: [place];  Service: Thoracic;  Laterality: N/A;     reports that he quit smoking about 7 years ago. His smoking use included cigarettes. He has a 15.00 pack-year smoking history. He has never used smokeless tobacco. He reports that he does not currently use alcohol. He reports that he does not use drugs.  No Known Allergies  Family History  Problem Relation Age of Onset   Colon cancer Neg Hx    Gastric cancer Neg Hx    Esophageal cancer Neg Hx     Prior to Admission medications   Medication Sig Start Date End Date Taking? Authorizing Provider  abiraterone acetate (ZYTIGA) 250 MG tablet Take 4 tablets (1,000 mg total) by mouth daily. Take on an empty stomach 1 hour before or 2 hours after a meal 02/09/22  Yes Derek Jack, MD  albuterol (VENTOLIN HFA) 108 (90 Base) MCG/ACT inhaler Inhale 2 puffs into the lungs every 6 (six) hours as needed for wheezing or shortness of breath. 02/08/22  Yes Derek Jack, MD  Calcium Carb-Cholecalciferol (CALCIUM 500 + D PO) Take by mouth.   Yes  [provider]  levothyroxine (SYNTHROID) 88 MCG tablet Take 1 tablet (88 mcg total) by mouth daily before breakfast. 02/02/22  Yes Derek Jack, MD  omeprazole (PRILOSEC) 20 MG capsule Take 1 capsule (20 mg total) by mouth daily. 02/02/22  Yes Derek Jack, MD  predniSONE (DELTASONE) 5 MG tablet Take 1 tablet (5 mg total) by mouth daily with breakfast. 02/07/22  Yes Derek Jack, MD  sildenafil (VIAGRA) 100 MG tablet Take 1 tablet (100 mg total) by mouth as needed for erectile dysfunction. 10/21/21  Yes McKenzie, Candee Furbish, MD  tamsulosin (FLOMAX) 0.4 MG CAPS capsule Take 1 capsule (0.4 mg total) by mouth 2 (two) times daily. 07/01/21  Yes Summerlin, Berneice Heinrich, PA-C  Gemcitabine HCl (GEMZAR IV) Inject into the vein once a week. Days 1 & 8 q 21 days    [provider]  guaiFENesin-dextromethorphan (ROBITUSSIN DM) 100-10 MG/5ML syrup Take 10 mLs by mouth every 8 (eight) hours as needed for cough. Patient not taking: Reported on 04/03/2022 01/03/22   Derek Jack, MD  HYDROcodone-acetaminophen [facility]) 10-325 MG tablet TAKE 1 TABLET EVERY 12 HOURS AS NEEDED. 03/03/22   Tarri Abernethy M, PA-C  ondansetron (ZOFRAN) 8 MG tablet Take by mouth. Patient not taking: Reported  on 04/03/2022 05/01/18   [provider]  prochlorperazine (COMPAZINE) 10 MG tablet Take 1 tablet (10 mg total) by mouth every 6 (six) hours as needed (Nausea or vomiting). Patient not taking: Reported on 11/13/2018 05/01/18 12/04/18  Zoila Shutter, MD    Physical Exam: Vitals:   04/03/22 1300 04/03/22 1330 04/03/22 1430 04/03/22 1454  BP: (!) 102/58 107/62 (!) 102/58   Pulse: 71 72 73   Resp: 18 15 16    Temp:    98.2 F (36.8 C)  TempSrc:      SpO2: 100% 100% 100%   Weight:      Height:        Constitutional: Chronically ill-appearing, calm, comfortable Vitals:   04/03/22 1300 04/03/22 1330 04/03/22 1430 04/03/22 1454  BP: (!) 102/58 107/62 (!) 102/58   Pulse: 71 72  73   Resp: 18 15 16    Temp:    98.2 F (36.8 C)  TempSrc:      SpO2: 100% 100% 100%   Weight:      Height:       Eyes: PERRL, lids and conjunctivae normal ENMT: Mucous membranes are moist. Neck: normal, supple, no masses, no thyromegaly Respiratory: Reduced air entry left lung fields, . Normal respiratory effort. No accessory muscle use.  Cardiovascular: Regular rate and rhythm, no murmurs / rubs / gallops. No extremity edema.  Extremities warm. Abdomen: no tenderness, no masses palpated. No hepatosplenomegaly.  Musculoskeletal: no clubbing / cyanosis. No joint deformity upper and lower extremities.  Skin: no rashes, lesions, ulcers. No induration Neurologic: No apparent cranial nerve abnormality, no facial asymmetry, moving all extremities spontaneously.  Psychiatric: Normal judgment and insight. Alert and oriented x 3. Normal mood.   Labs on Admission: I have personally reviewed following labs and imaging studies  CBC: Recent Labs  Lab 04/03/22 1127  WBC 7.7  NEUTROABS 6.2  HGB 8.6*  HCT 27.8*  MCV 90.0  PLT 622   Basic Metabolic Panel: Recent Labs  Lab 04/03/22 1127  NA 132*  K 3.9  CL 99  CO2 20*  GLUCOSE 104*  BUN 24*  CREATININE 1.93*  CALCIUM 7.4*   GFR: Estimated Creatinine Clearance: 26.6 mL/min (A) (by C-G formula based on SCr of 1.93 mg/dL (H)). Liver Function Tests: Recent Labs  Lab 04/03/22 1127  AST 16  ALT 9  ALKPHOS 62  BILITOT 0.4  PROT 6.7  ALBUMIN 2.6*    Radiological Exams on Admission: CT Angio Chest PE W and/or Wo Contrast  Result Date: 04/03/2022 CLINICAL DATA:  History of lung carcinoma, metastatic prostate carcinoma, chest pain and increased shortness of breath x3 days EXAM: CT ANGIOGRAPHY CHEST WITH CONTRAST TECHNIQUE: Multidetector CT imaging of the chest was performed using the standard protocol during bolus administration of intravenous contrast. Multiplanar CT image reconstructions and MIPs were obtained to evaluate the  vascular anatomy. RADIATION DOSE REDUCTION: This exam was performed according to the departmental dose-optimization program which includes automated exposure control, adjustment of the mA and/or kV according to patient size and/or use of iterative reconstruction technique. CONTRAST:  76mL OMNIPAQUE IOHEXOL 350 MG/ML SOLN COMPARISON:  Previous CT done on 10/03/2021, chest radiograph done earlier today FINDINGS: Cardiovascular: There is homogeneous enhancement in thoracic aorta. There are no intraluminal filling defects in pulmonary artery branches. There is homogeneous enhancement in thoracic aorta. Major branches of thoracic aorta appear patent. Scattered coronary artery calcifications are seen. Mediastinum/Nodes: There are enlarged lymph nodes in mediastinum with interval worsening. Largest of the nodes is  noted in aortopulmonary window measuring 2.7 cm in short axis. There is air-fluid level in thoracic esophagus. Lungs/Pleura: There is complete atelectasis in left lung. Evaluation of left lung for infiltrates or nodules is limited by complete atelectasis. There is no demonstrable air in left main bronchus and its branches. There is soft tissue attenuation in the lumen of left main bronchus. There is large left pleural effusion. Scattered blebs and bullae are seen in right lung. There are no infiltrates or discrete nodules in right lung. There is pleural thickening in the right apex, more so in the anterior aspect which has not changed significantly. There is no pneumothorax. Upper Abdomen: There is fullness of collecting systems in both kidneys. Ureters are not included in the study. There are abnormally enlarged lymph nodes in the retrocrural region and upper abdomen. There are few scattered low-density foci, possibly cysts or hemangiomas in the liver. Largest of these lesions measures 2.6 cm. Musculoskeletal: Slightly displaced fracture is seen in the posterior aspect of left twelfth rib. Sclerosis seen in the  left eighth rib may suggest metastatic disease. There are a few sclerotic lesions in thoracic spine. Severe degenerative changes are noted in the lower cervical spine. Review of the MIP images confirms the above findings. IMPRESSION: There is no evidence of pulmonary embolism. There is no evidence of thoracic aortic dissection. Coronary artery disease. There is complete atelectasis of left lung. There is soft tissue density in the lumen of left main bronchus in the mediastinum. This may be due to large mucous plug in the left main bronchus or neoplastic infiltration. Endoscopic correlation should be considered. Large left pleural effusion. COPD. Right lung is clear. There is significant interval worsening of lymphadenopathy in mediastinum. There are abnormally enlarged lymph nodes in upper abdomen. Findings suggest progression of metastatic lymphadenopathy. There is air-fluid level in thoracic esophagus suggesting gastroesophageal reflux. There is slightly displaced fracture in the posterior left twelfth rib. There is fullness of collecting systems in both kidneys. This finding is not fully evaluated. Other findings as described in the body of the report. Electronically Signed   By: Elmer Picker M.D.   On: 04/03/2022 13:14   DG Chest 2 View  Result Date: 04/03/2022 CLINICAL DATA:  History of lung cancer with increased shortness of breath EXAM: CHEST - 2 VIEW COMPARISON:  Chest radiograph dated 12/29/2021 FINDINGS: Left chest wall port tip projects over the superior cavoatrial junction. Right lung is hyperinflated. Complete opacification of the left lung with leftward deviation of the airways and mediastinum. Left lower lobe nodular mass is better evaluated on prior CT. No pneumothorax. The cardiac contours are obscured. The visualized skeletal structures are unremarkable. IMPRESSION: Complete opacification of the left lung with leftward deviation of the airways and mediastinum, likely due to atelectasis.  Superimposed consolidation or pleural effusion can not be excluded. Electronically Signed   By: Darrin Nipper M.D.   On: 04/03/2022 11:19    EKG: Independently reviewed.  Sinus rhythm, rate 86, QTc 452.  No significant change from prior.  Assessment/Plan Principal Problem:   Collapse of left lung Active Problems:   Squamous cell lung cancer, left (HCC)   Oropharyngeal carcinoma (HCC)   Iron deficiency anemia   Prostate cancer (HCC)   CKD (chronic kidney disease), stage III (HCC)   Hypotension  Assessment and Plan: * Collapse of left lung Presenting with left-sided chest and neck pain.  O2 sats 100% on room air here.  Not on home O2.  CTA chest-complete atelectasis of  left lung, soft tissue density in left main bronchus in the mediastinum-large mucous plug or neoplastic infiltration. - EDP Dr. Halford Chessman, recommended admission to [facility] or [facility], reconsult pulmonology on arrival. -N.p.o. midnight -   Squamous cell lung cancer, left ([place]) Follows with Dr. 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 -He is currently on treatment for prostate cancer, Zytiga and prednisone and monthly Degarelix  Hypotension Systolic down to 74, proved to low 100s after 2 L bolus.  No suggestion of sepsis at this time.  Not on antihypertensives. -Cont N/s 100cc/hr x 10hrs  CKD (chronic kidney disease), stage III (HCC) CKD 3B.  Creatinine 1.93 at baseline.  Iron deficiency anemia Acute on chronic anemia.  Hemoglobin 8.6, baseline ~ 10.  Recent anemia panel 02/2022 suggest both iron deficiency anemia and anemia of chronic disease. -Trend CBC for now   DVT prophylaxis: SCDS for now pending pulm eval Code Status:  FULL code- confirmed with patient at bedside Family Communication: None at bedside Disposition Plan: ~ 2 days Consults called: Pls reconsult pulmonology on arrival to [facility]  or [place] [facility].  Admission status: Inpt Tele I certify that at the point of admission it is my clinical judgment that the patient will require inpatient hospital care spanning beyond 2 midnights from the point of admission due to high intensity of service, high risk for further deterioration and high frequency of surveillance required.   Author: Bethena Roys, MD 04/03/2022 5:56 PM  For on call review [email or web address].

## 2022-04-03 NOTE — Assessment & Plan Note (Signed)
Systolic down to 74, proved to low 100s after 2 L bolus.  No suggestion of sepsis at this time.  Not on antihypertensives. -Cont N/s 100cc/hr x 10hrs

## 2022-04-03 NOTE — ED Triage Notes (Signed)
Pt c/o left side chest pain with left arm pain for the last few days  Pt is from cancer center and staff reported pt was found to have low O2 sats and pt started on 2l of oxygen via [place]

## 2022-04-03 NOTE — Assessment & Plan Note (Addendum)
Acute on chronic anemia.  Hemoglobin 8.6, baseline ~ 10.  Recent anemia panel 02/2022 suggest both iron deficiency anemia and anemia of chronic disease. -Trend CBC for now

## 2022-04-03 NOTE — Assessment & Plan Note (Addendum)
Presenting with left-sided chest and neck pain.  O2 sats 100% on room air here.  Not on home O2.  CTA chest-complete atelectasis of left lung, soft tissue density in left main bronchus in the mediastinum-large mucous plug or neoplastic infiltration. - EDP Dr. Halford Chessman, recommended admission to [facility] or [facility], reconsult pulmonology on arrival. -N.p.o. midnight -

## 2022-04-04 ENCOUNTER — Inpatient Hospital Stay (HOSPITAL_COMMUNITY): Payer: 59 | Admitting: Anesthesiology

## 2022-04-04 ENCOUNTER — Encounter (HOSPITAL_COMMUNITY)
Admission: EM | Disposition: A | Discharge status: Home / Self Care | Payer: Self-pay | Source: Home / Self Care | Attending: Internal Medicine

## 2022-04-04 DIAGNOSIS — Z87891 Personal history of nicotine dependence: Secondary | ICD-10-CM | POA: Diagnosis not present

## 2022-04-04 DIAGNOSIS — D649 Anemia, unspecified: Secondary | ICD-10-CM | POA: Diagnosis not present

## 2022-04-04 DIAGNOSIS — R918 Other nonspecific abnormal finding of lung field: Secondary | ICD-10-CM | POA: Diagnosis not present

## 2022-04-04 DIAGNOSIS — L03818 Cellulitis of other sites: Secondary | ICD-10-CM

## 2022-04-04 DIAGNOSIS — L0390 Cellulitis, unspecified: Secondary | ICD-10-CM

## 2022-04-04 DIAGNOSIS — C3492 Malignant neoplasm of unspecified part of left bronchus or lung: Secondary | ICD-10-CM | POA: Diagnosis not present

## 2022-04-04 DIAGNOSIS — N289 Disorder of kidney and ureter, unspecified: Secondary | ICD-10-CM

## 2022-04-04 DIAGNOSIS — C61 Malignant neoplasm of prostate: Secondary | ICD-10-CM | POA: Diagnosis not present

## 2022-04-04 DIAGNOSIS — J9809 Other diseases of bronchus, not elsewhere classified: Secondary | ICD-10-CM

## 2022-04-04 DIAGNOSIS — J9811 Atelectasis: Secondary | ICD-10-CM | POA: Diagnosis not present

## 2022-04-04 HISTORY — PX: VIDEO BRONCHOSCOPY: SHX5072

## 2022-04-04 HISTORY — PX: HEMOSTASIS CONTROL: SHX6838

## 2022-04-04 HISTORY — PX: BRONCHIAL BIOPSY: SHX5109

## 2022-04-04 LAB — BASIC METABOLIC PANEL
Anion gap: 10 (ref 5–15)
BUN: 22 mg/dL (ref 8–23)
CO2: 22 mmol/L (ref 22–32)
Calcium: 7.3 mg/dL — ABNORMAL LOW (ref 8.9–10.3)
Chloride: 101 mmol/L (ref 98–111)
Creatinine, Ser: 1.7 mg/dL — ABNORMAL HIGH (ref 0.61–1.24)
GFR, Estimated: 42 mL/min — ABNORMAL LOW (ref >60)
Glucose, Bld: 102 mg/dL — ABNORMAL HIGH (ref 70–99)
Potassium: 3.9 mmol/L (ref 3.5–5.1)
Sodium: 133 mmol/L — ABNORMAL LOW (ref 135–145)

## 2022-04-04 LAB — CBC
HCT: 26.2 % — ABNORMAL LOW (ref 39.0–52.0)
Hemoglobin: 8.4 g/dL — ABNORMAL LOW (ref 13.0–17.0)
MCH: 27.2 pg (ref 26.0–34.0)
MCHC: 32.1 g/dL (ref 30.0–36.0)
MCV: 84.8 fL (ref 80.0–100.0)
Platelets: 209 10*3/uL (ref 150–400)
RBC: 3.09 MIL/uL — ABNORMAL LOW (ref 4.22–5.81)
RDW: 17 % — ABNORMAL HIGH (ref 11.5–15.5)
WBC: 5.9 10*3/uL (ref 4.0–10.5)
nRBC: 0 % (ref 0.0–0.2)

## 2022-04-04 LAB — MRSA NEXT GEN BY PCR, NASAL: MRSA by PCR Next Gen: NOT DETECTED

## 2022-04-04 SURGERY — VIDEO BRONCHOSCOPY WITHOUT FLUORO
Anesthesia: General

## 2022-04-04 MED ORDER — EPINEPHRINE 1 MG/10ML IJ SOSY
PREFILLED_SYRINGE | INTRAMUSCULAR | Status: AC
  Filled 2022-04-04: qty 10

## 2022-04-04 MED ORDER — SODIUM CHLORIDE 0.9 % IV SOLN: INTRAVENOUS | Status: DC

## 2022-04-04 MED ORDER — PROPOFOL 500 MG/50ML IV EMUL
INTRAVENOUS | Status: DC | PRN
  Administered 2022-04-04: 75 ug/kg/min via INTRAVENOUS

## 2022-04-04 MED ORDER — SODIUM CHLORIDE 0.9 % IV SOLN
1.0000 g | INTRAVENOUS | Status: DC
  Administered 2022-04-04 – 2022-04-07 (×4): 1 g via INTRAVENOUS
  Filled 2022-04-04 (×4): qty 10

## 2022-04-04 MED ORDER — SODIUM CHLORIDE 0.9 % IV SOLN
INTRAVENOUS | Status: AC | PRN
  Administered 2022-04-04: 500 mL via INTRAMUSCULAR

## 2022-04-04 MED ORDER — PROPOFOL 10 MG/ML IV BOLUS
INTRAVENOUS | Status: DC | PRN
  Administered 2022-04-04: 100 mg via INTRAVENOUS

## 2022-04-04 MED ORDER — LIDOCAINE HCL 4 % EX SOLN
CUTANEOUS | Status: DC | PRN
  Administered 2022-04-04: 4 mL via TOPICAL

## 2022-04-04 MED ORDER — CHLORHEXIDINE GLUCONATE CLOTH 2 % EX PADS
6.0000 | MEDICATED_PAD | Freq: Every day | CUTANEOUS | Status: DC
  Administered 2022-04-05 – 2022-04-08 (×5): 6 via TOPICAL

## 2022-04-04 MED ORDER — FENTANYL CITRATE (PF) 100 MCG/2ML IJ SOLN
INTRAMUSCULAR | Status: DC | PRN
  Administered 2022-04-04: 50 ug via INTRAVENOUS

## 2022-04-04 MED ORDER — PHENYLEPHRINE HCL-NACL 20-0.9 MG/250ML-% IV SOLN
INTRAVENOUS | Status: DC | PRN
  Administered 2022-04-04: 75 ug/min via INTRAVENOUS

## 2022-04-04 MED ORDER — EPHEDRINE SULFATE-NACL 50-0.9 MG/10ML-% IV SOSY
PREFILLED_SYRINGE | INTRAVENOUS | Status: DC | PRN
  Administered 2022-04-04: 5 mg via INTRAVENOUS

## 2022-04-04 MED ORDER — SODIUM CHLORIDE (PF) 0.9 % IJ SOLN
PREFILLED_SYRINGE | INTRAMUSCULAR | Status: DC | PRN
  Administered 2022-04-04 (×2): 2 mL
  Administered 2022-04-04: 4 mL
  Administered 2022-04-04: 2 mL

## 2022-04-04 MED ORDER — PHENYLEPHRINE HCL-NACL 20-0.9 MG/250ML-% IV SOLN: INTRAVENOUS | Status: DC | PRN

## 2022-04-04 MED ORDER — SODIUM CHLORIDE 0.9% FLUSH
10.0000 mL | Freq: Two times a day (BID) | INTRAVENOUS | Status: DC
  Administered 2022-04-05: 20 mL
  Administered 2022-04-05 – 2022-04-08 (×6): 10 mL

## 2022-04-04 MED ORDER — PHENYLEPHRINE 80 MCG/ML (10ML) SYRINGE FOR IV PUSH (FOR BLOOD PRESSURE SUPPORT)
PREFILLED_SYRINGE | INTRAVENOUS | Status: DC | PRN
  Administered 2022-04-04 (×3): 80 ug via INTRAVENOUS

## 2022-04-04 MED ORDER — SUCCINYLCHOLINE CHLORIDE 200 MG/10ML IV SOSY
PREFILLED_SYRINGE | INTRAVENOUS | Status: DC | PRN
  Administered 2022-04-04: 100 mg via INTRAVENOUS

## 2022-04-04 NOTE — Hospital Course (Signed)
Alexander [name] is a 71 yo male with PMH left lung cancer, oropharyngeal cancer (SCC), prostate cancer, GERD, CKD3b who presented with pain over his left chest wall and redness.  He states symptoms had started approximately 4 to 5 days prior to admission.  He has also felt short of breath. He has not been on any recent antibiotics nor undergoing any recent cancer treatment with chemoradiation.  He follows outpatient with Dr. Delton Coombes and his last chemotherapy was gemcitabine from April through July 2023.  He has been on Degarelix and Abiraterone currently for prostate cancer treatment. New metastasis to left kidney after biopsy as well. Gemcitabine treatment for lung cancer has been on hold in setting of treating prostate cancer.  He underwent workup on admission regarding his shortness of breath and left-sided chest wall pains.  CXR showed complete opacification of the left lung fields.  CTA chest then obtained which was negative for PE and showed complete atelectasis of left lung.  There was also soft tissue density in the lumen of the left main bronchus and the mediastinum concerning for possible mucous plugging versus neoplastic infiltration. Pulmonology was consulted upon admission for further assistance with evaluation.

## 2022-04-04 NOTE — Plan of Care (Signed)

## 2022-04-04 NOTE — Assessment & Plan Note (Signed)
-   Involving base of tongue - hx chemoradiation therapy from 09/01/2014 through 09/22/2014 with 2 cycles of high-dose cisplatin.

## 2022-04-04 NOTE — Op Note (Signed)
[facility] Cardiopulmonary Patient Name: Alexander [name] Date: 04/04/2022 MRN: [id number] Attending MD: Collene Gobble , MD,  Date of Birth: [birth year] CSN: Finalized Age: 72 Admit Type: Inpatient Gender: Male Procedure:             Bronchoscopy Indications:           Left mainstem mass Providers:             Collene Gobble, MD, Dulcy Fanny, Faustina                         Mbumina, Technician Referring MD:           Medicines:             General Anesthesia, Epinephrine 1 mg/10 mL topical 10                         mL Complications:         No immediate complications Estimated Blood Loss:  Estimated blood loss: 20 mL requiring treatment with                         epinephrine. Procedure:             Pre-Anesthesia Assessment:                        - A History and Physical has been performed. Patient                         meds and allergies have been reviewed. The risks and                         benefits of the procedure and the sedation options and                         risks were discussed with the patient. All questions                         were answered and informed consent was obtained.                         Patient identification and proposed procedure were                         verified prior to the procedure by the physician in                         the pre-procedure area. Mental Status Examination:                         normal. Airway Examination: small/crowded                         oropharyngeal airway. Respiratory Examination: clear                         to auscultation in the right lung and poor air                         movement  in the left lung. CV Examination: normal. ASA                         Grade Assessment: III - A patient with severe systemic                         disease. After reviewing the risks and benefits, the                         patient was deemed in satisfactory condition to                          undergo the procedure. The anesthesia plan was to use                         general anesthesia. Immediately prior to                         administration of medications, the patient was                         re-assessed for adequacy to receive sedatives. The                         heart rate, respiratory rate, oxygen saturations,                         blood pressure, adequacy of pulmonary ventilation, and                         response to care were monitored throughout the                         procedure. The physical status of the patient was                         re-assessed after the procedure.                        After obtaining informed consent, the bronchoscope was                         passed under direct vision. Throughout the procedure,                         the patient's blood pressure, pulse, and oxygen                         saturations were monitored continuously. the [id number]                         ([id number]) Olympus bronchoscope was introduced through                         the mouth, via the endotracheal tube (the patient was                         intubated for the procedure) and advanced to the right  lung only. The procedure was accomplished without                         difficulty. The patient tolerated the procedure well. Scope In: Scope Out: Findings:      Left Lung Abnormalities: A completely obstructing mass was found       proximally, at the orifice in the left mainstem bronchus. The mass was       large and bloody, endobronchial, exophytic, friable, polypoid and       raised. The lesion was not traversed. There was some extension of the       mass towards the R mainstem, and down the inferior wall of the R       mainstem with 50% occlusion. The scope would pass the lesion into the R       mainstem. The RUL airway was somewhat edematous and narrowed but patent.       The RML and RLL airways were normal.       Endobronchial biopsies of a mass were performed in the left mainstem       bronchus using a forceps and sent for histopathology examination. Two       samples were obtained. Impression:            - Left mainstem mass                        - A bloody, endobronchial, exophytic, friable,                         polypoid and raised mass was found in the left                         mainstem bronchus. This lesion is malignant.                        - Endobronchial biopsies were performed. Moderate Sedation:      Performed under general anesthesia Recommendation:        - Refer to/consult with Radiation Oncology.                        - Refer to/consult with Thoracic Surgery. Procedure Code(s):     --- Professional ---                        [phone], Bronchoscopy, rigid or flexible, including                         fluoroscopic guidance, when performed; with bronchial                         or endobronchial biopsy(s), single or multiple sites Diagnosis Code(s):     --- Professional ---                        R91.8, Other nonspecific abnormal finding of lung field                        C34.02, Malignant neoplasm of left main bronchus CPT copyright 2022 [employer]. All rights reserved. The codes documented in this report are preliminary and upon coder review may  be  revised to meet current compliance requirements. Collene Gobble, MD Collene Gobble, MD 04/04/2022 3:20:48 PM Number of Addenda: 0

## 2022-04-04 NOTE — Progress Notes (Signed)
. Radiation Oncology         [phone] ________________________________  Initial Inpatient Consultation  Name: Alexander [name] MRN: [id number]  Date: 04/05/2022  DOB: [birth year]  WI:[id number], [name], MD  Dwyane Dee, MD   REFERRING PHYSICIAN: Dwyane Dee, MD  DIAGNOSIS: No diagnosis found.   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 Delton Coombes  CHIEF COMPLAINT: Here to discuss management of left lung mass   HISTORY OF PRESENT ILLNESS::Alexander [name] is a 72 y.o. 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 Delton Coombes, and stage 3 CKD. He is currently undergoing treatment for prostate cancer consisting of daily Abiraterone, prednisone and monthly Degarelix.   The patient recently presented to the cancer center on 04/03/21 and was found to be hypotensive, hypoxic at 78% on room air, and endorsed left neck and chest pain x 3-4 days. The patient was accordingly sent to the ED for further evaluation. Chest x-ray performed on arrival showed complete opacification of left lung with left fourth deviation of the airways and mediastinum likely due to atelectasis. This prompted a CTA of the chest which demonstrated complete  atelectasis of left lung and soft tissue in the lumen of left main bronchus in the mediastinum concerning for either mucous plugging or neoplastic infiltration. CT otherwise showed no evidence of PE or thoracic aortic dissection.  ED course included 2L bolus with improvement of BP.   The patient was promptly admitted yesterday for further management including bronchoscopy with biopsies of the left mainstem occlusion/mass (performed yesterday by Dr. Lamonte Sakai). Pathology results are pending at this time. 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  Total dose: 70 Gy in 35 fractions. (of note, he did miss several treatments requiring BID dosing towards the end of treatment)   PAST MEDICAL HISTORY:  has a past medical history of GERD (gastroesophageal reflux disease), Mass of neck, Oropharyngeal cancer ([place]) (07/28/2014), and 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[place]) (08/06/2014).    PAST SURGICAL HISTORY: Past Surgical History:  Procedure Laterality Date   BIOPSY  01/15/2018   Procedure: BIOPSY;  Surgeon: Danie Binder, MD;  [facility];  Service: Endoscopy;;  gastric   COLONOSCOPY N/A 03/13/2016   Procedure: COLONOSCOPY;  Surgeon: Danie Binder, MD;  [facility];  Service: Endoscopy;  Laterality: N/A;  2:15 PM   CYSTOSCOPY N/A 08/29/2021   Procedure: CYSTOSCOPY;  Surgeon: Cleon Gustin, MD;  Location: [facility];  Service: Urology;  Laterality: N/A;   CYSTOSCOPY WITH INSERTION OF UROLIFT N/A 06/27/2021   Procedure: CYSTOSCOPY WITH INSERTION OF UROLIFT;  Surgeon: Cleon Gustin, MD;  Location: [facility];  Service: Urology;  Laterality: N/A;  ESOPHAGOGASTRODUODENOSCOPY (EGD) WITH PROPOFOL N/A 08/17/2014   Procedure: ESOPHAGOGASTRODUODENOSCOPY (EGD) WITH PROPOFOL (procedure #1);  Surgeon: Aviva Signs Md, MD;  Location: [facility];  Service: General;  Laterality: N/A;   ESOPHAGOGASTRODUODENOSCOPY (EGD) WITH PROPOFOL N/A 01/15/2018   Procedure: ESOPHAGOGASTRODUODENOSCOPY (EGD) WITH PROPOFOL;  Surgeon: Danie Binder, MD;  [facility];  Service: Endoscopy;  Laterality: N/A;  9:30am   MULTIPLE EXTRACTIONS WITH ALVEOLOPLASTY N/A 08/12/2014   Procedure: Extraction of tooth #'s 6,17,22,23,24,25,26,27 with alveoloplasty;  Surgeon: Lenn Cal, DDS;  Location: [facility];  Service: Oral Surgery;  Laterality: N/A;   PANENDOSCOPY N/A 08/06/2014   Procedure: PANENDOSCOPY WITH BIOPSY;  Surgeon: Leta Baptist, MD;  Location: [place];  Service: ENT;  Laterality: N/A;   PEG PLACEMENT Left 08/17/14   PEG PLACEMENT N/A 08/17/2014   Procedure: PERCUTANEOUS ENDOSCOPIC GASTROSTOMY (PEG) PLACEMENT (procedure #1);  Surgeon: Aviva Signs Md, MD;  [facility];  Service: General;  Laterality: N/A;   PORT-A-CATH REMOVAL Right 07/17/2016   Procedure: MINOR REMOVAL PORT-A-CATH;  Surgeon: Aviva Signs, MD;  [facility];  Service: General;  Laterality: Right;   PORTACATH PLACEMENT Right 08/17/14   PORTACATH PLACEMENT Right 08/17/2014   Procedure: INSERTION PORT-A-CATH (procedure #2);  Surgeon: Aviva Signs Md, MD;  [facility];  Service: General;  Laterality: Right;   PORTACATH PLACEMENT Left 04/26/2018   Procedure: INSERTION PORT-A-CATH (attached catheter in left subclavian);  Surgeon: Aviva Signs, MD;  [facility];  Service: General;  Laterality: Left;   SAVORY DILATION N/A 01/15/2018   Procedure: SAVORY DILATION;  Surgeon: Danie Binder, MD;  [facility];  Service: Endoscopy;  Laterality: N/A;   TRANSURETHRAL RESECTION OF PROSTATE N/A 08/29/2021   Procedure: TRANSURETHRAL RESECTION OF THE PROSTATE (TURP);  Surgeon: Cleon Gustin, MD;  [facility];  Service: Urology;  Laterality: N/A;   VIDEO BRONCHOSCOPY WITH ENDOBRONCHIAL ULTRASOUND N/A 04/15/2018   Procedure: VIDEO BRONCHOSCOPY  WITH ENDOBRONCHIAL ULTRASOUND;  Surgeon: Melrose Nakayama, MD;  Location: [place];  Service: Thoracic;  Laterality: N/A;    FAMILY HISTORY: family history is not on file.  SOCIAL HISTORY:  reports that he quit smoking about 7 years ago. His smoking use included cigarettes. He has a 15.00 pack-year smoking history. He has never used smokeless tobacco. He reports that he does not currently use alcohol. He reports that he does not use drugs.  ALLERGIES: Patient has no known allergies.  MEDICATIONS:  No current facility-administered medications for this encounter.   No current outpatient medications on file.   Facility-Administered Medications Ordered in Other Encounters  Medication Dose Route Frequency Provider Last Rate Last Admin   abiraterone acetate (ZYTIGA) tablet 1,000 mg  1,000 mg Oral Daily Byrum, Rose Fillers, MD       acetaminophen (TYLENOL) tablet 650 mg  650 mg Oral Q6H PRN Collene Gobble, MD       Or   acetaminophen (TYLENOL) suppository 650 mg  650 mg Rectal Q6H PRN Collene Gobble, MD       albuterol (PROVENTIL) (2.5 MG/3ML) 0.083% nebulizer solution 2.5 mg  2.5 mg Inhalation Q6H PRN Collene Gobble, MD       cefTRIAXone (ROCEPHIN) 1 g in sodium chloride 0.9 % 100 mL IVPB  1 g Intravenous Q24H Byrum, Rose Fillers, MD       HYDROcodone-acetaminophen (NORCO) 10-325 MG per tablet 1 tablet  1 tablet Oral Q6H PRN Collene Gobble, MD  levothyroxine (SYNTHROID) tablet 88 mcg  88 mcg Oral QAC breakfast Collene Gobble, MD       ondansetron [facility]) tablet 4 mg  4 mg Oral Q6H PRN Collene Gobble, MD       Or   ondansetron [facility]) injection 4 mg  4 mg Intravenous Q6H PRN Collene Gobble, MD       pantoprazole (PROTONIX) EC tablet 40 mg  40 mg Oral Daily Collene Gobble, MD   40 mg at 04/03/22 2111   polyethylene glycol (MIRALAX / GLYCOLAX) packet 17 g  17 g Oral Daily PRN Collene Gobble, MD       predniSONE (DELTASONE) tablet 5 mg  5 mg Oral Q breakfast Byrum, Rose Fillers, MD        sodium chloride 0.9 % bolus 1,000 mL  1,000 mL Intravenous Once Collene Gobble, MD       tamsulosin [facility]) capsule 0.4 mg  0.4 mg Oral BID Collene Gobble, MD   0.4 mg at 04/03/22 2111    REVIEW OF SYSTEMS:  Notable for that above.   PHYSICAL EXAM:  vitals were not taken for this visit.   General: Alert and oriented, in no acute distress *** HEENT: Head is normocephalic. Extraocular movements are intact. Oropharynx is clear. Neck: Neck is supple, no palpable cervical or supraclavicular lymphadenopathy. Heart: Regular in rate and rhythm with no murmurs, rubs, or gallops. Chest: Clear to auscultation bilaterally, with no rhonchi, wheezes, or rales. Abdomen: Soft, nontender, nondistended, with no rigidity or guarding. Extremities: No cyanosis or edema. Lymphatics: see Neck Exam Skin: No concerning lesions. Musculoskeletal: symmetric strength and muscle tone throughout. Neurologic: Cranial nerves II through XII are grossly intact. No obvious focalities. Speech is fluent. Coordination is intact. Psychiatric: Judgment and insight are intact. Affect is appropriate.   ECOG = ***  0 - Asymptomatic (Fully active, able to carry on all predisease activities without restriction)  1 - Symptomatic but completely ambulatory (Restricted in physically strenuous activity but ambulatory and able to carry out work of a light or sedentary nature. For example, light housework, office work)  2 - Symptomatic, <50% in bed during the day (Ambulatory and capable of all self care but unable to carry out any work activities. Up and about more than 50% of waking hours)  3 - Symptomatic, >50% in bed, but not bedbound (Capable of only limited self-care, confined to bed or chair 50% or more of waking hours)  4 - Bedbound (Completely disabled. Cannot carry on any self-care. Totally confined to bed or chair)  5 - Death   Eustace Pen MM, Creech RH, Tormey DC, et al. [phone]). "Toxicity and response criteria of the [facility]". [place] Oncol. 5 (6): 649-55   LABORATORY DATA:  Lab Results  Component Value Date   WBC 5.9 04/04/2022   HGB 8.4 (L) 04/04/2022   HCT 26.2 (L) 04/04/2022   MCV 84.8 04/04/2022   PLT 209 04/04/2022   CMP     Component Value Date/Time   NA 133 (L) 04/04/2022 0336   K 3.9 04/04/2022 0336   CL 101 04/04/2022 0336   CO2 22 04/04/2022 0336   GLUCOSE 102 (H) 04/04/2022 0336   BUN 22 04/04/2022 0336   CREATININE 1.70 (H) 04/04/2022 0336   CALCIUM 7.3 (L) 04/04/2022 0336   PROT 6.7 04/03/2022 1127   ALBUMIN 2.6 (L) 04/03/2022 1127   AST 16 04/03/2022 1127   ALT 9 04/03/2022 1127  ALKPHOS 62 04/03/2022 1127   BILITOT 0.4 04/03/2022 1127   GFRNONAA 42 (L) 04/04/2022 0336   GFRAA 35 (L) 12/11/2019 1202         RADIOGRAPHY: CT Angio Chest PE W and/or Wo Contrast  Result Date: 04/03/2022 CLINICAL DATA:  History of lung carcinoma, metastatic prostate carcinoma, chest pain and increased shortness of breath x3 days EXAM: CT ANGIOGRAPHY CHEST WITH CONTRAST TECHNIQUE: Multidetector CT imaging of the chest was performed using the standard protocol during bolus administration of intravenous contrast. Multiplanar CT image reconstructions and MIPs were obtained to evaluate the vascular anatomy. RADIATION DOSE REDUCTION: This exam was performed according to the departmental dose-optimization program which includes automated exposure control, adjustment of the mA and/or kV according to patient size and/or use of iterative reconstruction technique. CONTRAST:  56mL OMNIPAQUE IOHEXOL 350 MG/ML SOLN COMPARISON:  Previous CT done on 10/03/2021, chest radiograph done earlier today FINDINGS: Cardiovascular: There is homogeneous enhancement in thoracic aorta. There are no intraluminal filling defects in pulmonary artery branches. There is homogeneous enhancement in thoracic aorta. Major branches of thoracic aorta appear patent. Scattered coronary artery calcifications are seen.  Mediastinum/Nodes: There are enlarged lymph nodes in mediastinum with interval worsening. Largest of the nodes is noted in aortopulmonary window measuring 2.7 cm in short axis. There is air-fluid level in thoracic esophagus. Lungs/Pleura: There is complete atelectasis in left lung. Evaluation of left lung for infiltrates or nodules is limited by complete atelectasis. There is no demonstrable air in left main bronchus and its branches. There is soft tissue attenuation in the lumen of left main bronchus. There is large left pleural effusion. Scattered blebs and bullae are seen in right lung. There are no infiltrates or discrete nodules in right lung. There is pleural thickening in the right apex, more so in the anterior aspect which has not changed significantly. There is no pneumothorax. Upper Abdomen: There is fullness of collecting systems in both kidneys. Ureters are not included in the study. There are abnormally enlarged lymph nodes in the retrocrural region and upper abdomen. There are few scattered low-density foci, possibly cysts or hemangiomas in the liver. Largest of these lesions measures 2.6 cm. Musculoskeletal: Slightly displaced fracture is seen in the posterior aspect of left twelfth rib. Sclerosis seen in the left eighth rib may suggest metastatic disease. There are a few sclerotic lesions in thoracic spine. Severe degenerative changes are noted in the lower cervical spine. Review of the MIP images confirms the above findings. IMPRESSION: There is no evidence of pulmonary embolism. There is no evidence of thoracic aortic dissection. Coronary artery disease. There is complete atelectasis of left lung. There is soft tissue density in the lumen of left main bronchus in the mediastinum. This may be due to large mucous plug in the left main bronchus or neoplastic infiltration. Endoscopic correlation should be considered. Large left pleural effusion. COPD. Right lung is clear. There is significant interval  worsening of lymphadenopathy in mediastinum. There are abnormally enlarged lymph nodes in upper abdomen. Findings suggest progression of metastatic lymphadenopathy. There is air-fluid level in thoracic esophagus suggesting gastroesophageal reflux. There is slightly displaced fracture in the posterior left twelfth rib. There is fullness of collecting systems in both kidneys. This finding is not fully evaluated. Other findings as described in the body of the report. Electronically Signed   By: Elmer Picker M.D.   On: 04/03/2022 13:14   DG Chest 2 View  Result Date: 04/03/2022 CLINICAL DATA:  History of lung cancer with  increased shortness of breath EXAM: CHEST - 2 VIEW COMPARISON:  Chest radiograph dated 12/29/2021 FINDINGS: Left chest wall port tip projects over the superior cavoatrial junction. Right lung is hyperinflated. Complete opacification of the left lung with leftward deviation of the airways and mediastinum. Left lower lobe nodular mass is better evaluated on prior CT. No pneumothorax. The cardiac contours are obscured. The visualized skeletal structures are unremarkable. IMPRESSION: Complete opacification of the left lung with leftward deviation of the airways and mediastinum, likely due to atelectasis. Superimposed consolidation or pleural effusion can not be excluded. Electronically Signed   By: Darrin Nipper M.D.   On: 04/03/2022 11:19      IMPRESSION/PLAN:***    On date of service, in total, I spent *** minutes on this encounter. Patient was seen in person.   __________________________________________   Eppie Gibson, MD  This document serves as a record of services personally performed by Eppie Gibson, MD. It was created on her behalf by Roney Mans, a trained medical scribe. The creation of this record is based on the scribe's personal observations and the provider's statements to them. This document has been checked and approved by the attending provider.

## 2022-04-04 NOTE — Progress Notes (Signed)
Progress Note    Alexander [name]   ZOX:[id number]  DOB: Sep 17, 1950  DOA: 04/03/2022     1 PCP: Alexander Evens, MD  Initial CC: left chest pain, SOB  Hospital Course: Alexander [name] is a 72 yo male with PMH left lung cancer, oropharyngeal cancer ([facility]), prostate cancer, GERD, CKD3b who presented with pain over his left chest wall and redness.  He states symptoms had started approximately 4 to 5 days prior to admission.  He has also felt short of breath. He has not been on any recent antibiotics nor undergoing any recent cancer treatment with chemoradiation.  He follows outpatient with Dr. Delton Coombes and his last chemotherapy was gemcitabine from April through July 2023.  He has been on Degarelix and Abiraterone currently for prostate cancer treatment. New metastasis to left kidney after biopsy as well. Gemcitabine treatment for lung cancer has been on hold in setting of treating prostate cancer.  He underwent workup on admission regarding his shortness of breath and left-sided chest wall pains.  CXR showed complete opacification of the left lung fields.  CTA chest then obtained which was negative for PE and showed complete atelectasis of left lung.  There was also soft tissue density in the lumen of the left main bronchus and the mediastinum concerning for possible mucous plugging versus neoplastic infiltration. Pulmonology was consulted upon admission for further assistance with evaluation.    Interval History:  Short of breath some but still on RA. Mostly complaining of the left chest wall redness and tenderness. He understands plan for bronch and further workup of his left lung collapse.   Assessment and Plan: * Collapse of left lung - Presenting with left-sided chest and neck pain. - no hypoxia  - CXR and CTA chest noting full collapse; differential includes chronic collapse/atelectasis from mass effect vs mucous plug vs other - pulmonology consulted for help with next steps to treat and  evaluate; possibility of bronch in case of need for biopsy; also discussing with radonc - depending on workup, further decision to be made about thoracentesis for the effusion as well  Cellulitis - Patient endorsing erythema and tenderness over left chest wall.  Port is on that side but no obvious edema and low suspicion for involvement of port at this time - Start on Rocephin and monitor for improvement - may still need ultrasound if suspicion increases for port involvement   Squamous cell lung cancer, left ([place]) Follows with Dr. 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 -He is currently on treatment for prostate cancer, Zytiga and prednisone and monthly Degarelix  Hypotension - s/p IVF - continue abx  Prostate cancer ([place]) - On current treatment with oncology.  New multifocal bone mets noted in pelvis, spine, bilateral ribs.  Also new mediastinal and hilar lymphadenopathy and recent evidence of metastasis to the left kidney. 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   CKD (chronic kidney disease), stage III (HCC) CKD 3B.  Creatinine 1.93 at baseline.  Iron deficiency anemia Acute on chronic anemia.  Hemoglobin 8.6, baseline ~ 10.  Recent anemia panel 02/2022 suggest both iron deficiency anemia and anemia of chronic disease. -Trend CBC for now   Old records reviewed in assessment of this patient  Antimicrobials: Rocephin 04/04/22 >> current   DVT  prophylaxis:  SCDs Start: 04/03/22 1858   Code Status:   Code Status: Full Code  Mobility Assessment (last 72 hours)     Mobility Assessment     Row Name 04/03/22 2117           Does patient have an order for bedrest or is patient medically unstable No -  Continue assessment       What is the highest level of mobility based on the progressive mobility assessment? Level 3 (Stands with assist) - Balance while standing  and cannot march in place       Is the above level different from baseline mobility prior to current illness? No - Consider discontinuing PT/OT                Barriers to discharge:  Disposition Plan:  Home 3-4 days Status is: Inpt  Objective: Blood pressure 108/67, pulse 81, temperature 99.3 F (37.4 C), temperature source Oral, resp. rate 17, height 6\' 1"  (1.854 m), weight 54.4 kg, SpO2 100 %.  Examination:  Physical Exam Constitutional:      General: He is not in acute distress.    Appearance: Normal appearance.     Comments: Very thin  HENT:     Head: Normocephalic and atraumatic.     Mouth/Throat:     Mouth: Mucous membranes are moist.  Eyes:     Extraocular Movements: Extraocular movements intact.  Cardiovascular:     Rate and Rhythm: Normal rate and regular rhythm.     Heart sounds: Normal heart sounds.  Pulmonary:     Effort: Pulmonary effort is normal. No respiratory distress.     Breath sounds: No wheezing.     Comments: Coarse breath sounds bilaterally but decreased breath sounds noted on left lung fields Chest:     Comments: Erythema along left upper chest, lower neck. Does not involve port site. Some mild TTP. No edema or drainage  Abdominal:     General: Bowel sounds are normal. There is no distension.     Palpations: Abdomen is soft.     Tenderness: There is no abdominal tenderness.  Musculoskeletal:        General: Normal range of motion.     Cervical back: Normal range of motion and neck supple.  Skin:    General: Skin is warm and dry.  Neurological:     General: No focal deficit present.     Mental Status: He is alert.  Psychiatric:        Mood and Affect: Mood normal.        Behavior: Behavior normal.      Consultants:  Pulmonology   Procedures:    Data Reviewed: Results  for orders placed or performed during the hospital encounter of 04/03/22 (from the past 24 hour(s))  Basic metabolic panel     Status: Abnormal   Collection Time: 04/04/22  3:36 AM  Result Value Ref Range   Sodium 133 (L) 135 - 145 mmol/L   Potassium 3.9 3.5 - 5.1 mmol/L   Chloride 101 98 - 111 mmol/L   CO2 22 22 - 32 mmol/L   Glucose, Bld 102 (H) 70 - 99 mg/dL   BUN 22 8 - 23 mg/dL   Creatinine, Ser 1.70 (H) 0.61 - 1.24 mg/dL   Calcium 7.3 (L) 8.9 - 10.3 mg/dL   GFR, Estimated 42 (L) >60 mL/min   Anion gap 10 5 - 15  CBC     Status: Abnormal   Collection  Time: 04/04/22  3:36 AM  Result Value Ref Range   WBC 5.9 4.0 - 10.5 K/uL   RBC 3.09 (L) 4.22 - 5.81 MIL/uL   Hemoglobin 8.4 (L) 13.0 - 17.0 g/dL   HCT 26.2 (L) 39.0 - 52.0 %   MCV 84.8 80.0 - 100.0 fL   MCH 27.2 26.0 - 34.0 pg   MCHC 32.1 30.0 - 36.0 g/dL   RDW 17.0 (H) 11.5 - 15.5 %   Platelets 209 150 - 400 K/uL   nRBC 0.0 0.0 - 0.2 %    I have reviewed pertinent nursing notes, vitals, labs, and images as necessary. I have ordered labwork to follow up on as indicated.  I have reviewed the last notes from staff over past 24 hours. I have discussed patient's care plan and test results with nursing staff, CM/SW, and other staff as appropriate.  Time spent: Greater than 50% of the 55 minute visit was spent in counseling/coordination of care for the patient as laid out in the A&P.   LOS: 1 day   Dwyane Dee, MD [facility] 04/04/2022, 1:37 PM

## 2022-04-04 NOTE — Consult Note (Signed)
NAME:  Alexander [name], MRN:  [id number], DOB:  October 22, 1950, LOS: 1 ADMISSION DATE:  04/03/2022, CONSULTATION DATE: 04/04/2022 REFERRING MD: Dr. 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  Completed 6 cycles chemotherapy 04/2021, degarelix, abiraterone started 10/2021.  PET scan 09/2021 showed narrowing of his left mainstem bronchus but with no overt occlusion.  He did have new multifocal skeletal metastases, radiotracer avid mediastinal and hilar lymphadenopathy, right hydronephrosis with possible local extension of prostate cancer level of the right distal ureter, intense prostate gland hypermetabolism. He began to experience pain in his left clavicular area between his port and the left neck about 5 days prior to admission.  Then progressive shortness of breath and some left-sided chest discomfort beginning 3 days prior to admission.  CT-PA without any evidence of pulmonary embolism but did show complete left atelectasis, and associated free-flowing left effusion, left mainstem bronchus cutoff sign.  PCCM consulted to help with management  Pertinent  Medical History   Past Medical History:  Diagnosis Date   GERD (gastroesophageal reflux disease)    Mass of neck    dx. oropharyngeal squamous cell carcinoma- Chemo. radiation planned   Oropharyngeal cancer ([place]) 07/28/2014   dx. 3 weeks ago.- Dr. [facility] [place], [place].   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  New multifocal skeletal metastases with intense hypermetabolism, significant hypermetabolism prostate, increased right hydronephrosis, question related to local extension of the prostate carcinoma at level of right distal ureter. CT-PA chest 04/03/2022 > no pulmonary embolism, complete left lung atelectasis with soft tissue density in left mainstem bronchus, mediastinal enlargement.  Associated large left pleural effusion.  Question left mainstem mass versus mucous plugging.  Interim History / Subjective:  Patient complains left upper clavicular and lower neck discomfort, warmth Improved shortness of breath.  Still feels like he cannot get a full deep breath.  Minimal left-sided chest pain  Objective   Blood pressure (!) 97/59, pulse 78, temperature 98.4 F (36.9 C), temperature source Oral, resp. rate 16, height 6\' 1"  (1.854 m), weight 54.4 kg, SpO2 99 %.        Intake/Output Summary (Last 24 hours) at 04/04/2022 1141 Last data filed at 04/03/2022 1436 Gross per 24 hour  Intake 1000 ml  Output --  Net 1000 ml   Filed Weights   04/03/22 1049  Weight: 54.4 kg    Examination: General: Cachectic gentleman laying in bed in no distress. HENT: Slight tongue deviation, no mass noted.  Mild dysarthria but easily understood.  Some radiation change left neck, port in place, some mild erythema, warmth, significant tenderness left supraclavicular region Lungs: Minimal air movement left, with some bronchial breath sounds.  Clear on the right. Cardiovascular: Regular, distant, no murmur Abdomen: Nondistended, positive bowel sounds Extremities: No edema Neuro: Awake, alert, interacting appropriately, moves all extremities and follows commands.    Resolved Hospital Problem list     Assessment & Plan:  Complete left atelectasis with left mainstem occlusion  on CT chest.  Looks most consistent with  either mass or external compression from lymphadenopathy.  Consider also mucous plugging. -Discussed pros and cons of inspection bronchoscopy with the patient to determine whether mass or external compression present.  I recommended he agrees to inspection bronchoscopy.  If this is mucous plugging we will try to clear secretions to relieve atelectasis.  More likely this reflects progression of his malignancy.  If there is an endobronchial lesion we will try to biopsy it.  He will likely need palliative XRT to try to open his left mainstem bronchus.  He would be open to this if it is possible to undergo.  He has had documented squamous cell lung cancer in the left mainstem bronchus on biopsy in the past, but if there is no endobronchial lesion then he may need to go back for EBUS, nodal biopsies to differentiate between squamous cell cancer and prostate cancer.  Plan for bronchoscopy this afternoon.  Keep him n.p.o.   Left pleural effusion, likely secondary to left lung atelectasis. -Likely little benefit to be had from drainage as I do not believe the left lung is going to reinflate due to proximal obstruction.  If we are able to determine that there is no proximal obstruction or if this is reversible then thoracentesis would likely be beneficial.  Left periclavicular and lower neck cellulitis -Agree with initiation of antibiotics -May need to consider further neck imaging if discomfort continues, any expansion of skin erythema.    Labs   CBC: Recent Labs  Lab 04/03/22 1127 04/04/22 0336  WBC 7.7 5.9  NEUTROABS 6.2  --   HGB 8.6* 8.4*  HCT 27.8* 26.2*  MCV 90.0 84.8  PLT 212 151    Basic Metabolic Panel: Recent Labs  Lab 04/03/22 1127 04/04/22 0336  NA 132* 133*  K 3.9 3.9  CL 99 101  CO2 20* 22  GLUCOSE 104* 102*  BUN 24* 22  CREATININE 1.93* 1.70*  CALCIUM 7.4* 7.3*   GFR: Estimated Creatinine Clearance: 30.2 mL/min (A) (by C-G formula based on SCr of 1.7 mg/dL  (H)). Recent Labs  Lab 04/03/22 1127 04/04/22 0336  WBC 7.7 5.9    Liver Function Tests: Recent Labs  Lab 04/03/22 1127  AST 16  ALT 9  ALKPHOS 62  BILITOT 0.4  PROT 6.7  ALBUMIN 2.6*   No results for input(s): "LIPASE", "AMYLASE" in the last 168 hours. No results for input(s): "AMMONIA" in the last 168 hours.  ABG No results found for: "PHART", "PCO2ART", "PO2ART", "HCO3", "TCO2", "ACIDBASEDEF", "O2SAT"   Coagulation Profile: No results for input(s): "INR", "PROTIME" in the last 168 hours.  Cardiac Enzymes: No results for input(s): "CKTOTAL", "CKMB", "CKMBINDEX", "TROPONINI" in the last 168 hours.  HbA1C: No results found for: "HGBA1C"  CBG: No results for input(s): "GLUCAP" in the last 168 hours.  Review of Systems:   As per HPI  Past Medical History:  He,  has a past medical history of GERD (gastroesophageal reflux disease), Mass of neck, Oropharyngeal cancer ([place]) (07/28/2014), and 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[place]) (08/06/2014).   Surgical History:   Past Surgical History:  Procedure Laterality Date   BIOPSY  01/15/2018   Procedure: BIOPSY;  Surgeon: Danie Binder, MD;  [facility];  Service: Endoscopy;;  gastric   COLONOSCOPY N/A 03/13/2016   Procedure: COLONOSCOPY;  Surgeon: Danie Binder, MD;  [facility];  Service: Endoscopy;  Laterality: N/A;  2:15 PM  CYSTOSCOPY N/A 08/29/2021   Procedure: CYSTOSCOPY;  Surgeon: Cleon Gustin, MD;  [facility];  Service: Urology;  Laterality: N/A;   CYSTOSCOPY WITH INSERTION OF UROLIFT N/A 06/27/2021   Procedure: CYSTOSCOPY WITH INSERTION OF UROLIFT;  Surgeon: Cleon Gustin, MD;  [facility];  Service: Urology;  Laterality: N/A;   ESOPHAGOGASTRODUODENOSCOPY (EGD) WITH PROPOFOL N/A 08/17/2014   Procedure: ESOPHAGOGASTRODUODENOSCOPY (EGD) WITH PROPOFOL (procedure #1);  Surgeon: Aviva Signs Md, MD;  [facility]: [facility];  Service: General;  Laterality: N/A;    ESOPHAGOGASTRODUODENOSCOPY (EGD) WITH PROPOFOL N/A 01/15/2018   Procedure: ESOPHAGOGASTRODUODENOSCOPY (EGD) WITH PROPOFOL;  Surgeon: Danie Binder, MD;  [facility];  Service: Endoscopy;  Laterality: N/A;  9:30am   MULTIPLE EXTRACTIONS WITH ALVEOLOPLASTY N/A 08/12/2014   Procedure: Extraction of tooth #'s 6,17,22,23,24,25,26,27 with alveoloplasty;  Surgeon: Lenn Cal, DDS;  Location: [facility];  Service: Oral Surgery;  Laterality: N/A;   PANENDOSCOPY N/A 08/06/2014   Procedure: PANENDOSCOPY WITH BIOPSY;  Surgeon: Leta Baptist, MD;  Location: [facility];  Service: ENT;  Laterality: N/A;   PEG PLACEMENT Left 08/17/14   PEG PLACEMENT N/A 08/17/2014   Procedure: PERCUTANEOUS ENDOSCOPIC GASTROSTOMY (PEG) PLACEMENT (procedure #1);  Surgeon: Aviva Signs Md, MD;  [facility];  Service: General;  Laterality: N/A;   PORT-A-CATH REMOVAL Right 07/17/2016   Procedure: MINOR REMOVAL PORT-A-CATH;  Surgeon: Aviva Signs, MD;  [facility];  Service: General;  Laterality: Right;   PORTACATH PLACEMENT Right 08/17/14   PORTACATH PLACEMENT Right 08/17/2014   Procedure: INSERTION PORT-A-CATH (procedure #2);  Surgeon: Aviva Signs Md, MD;  [facility];  Service: General;  Laterality: Right;   PORTACATH PLACEMENT Left 04/26/2018   Procedure: INSERTION PORT-A-CATH (attached catheter in left subclavian);  Surgeon: Aviva Signs, MD;  [facility];  Service: General;  Laterality: Left;   SAVORY DILATION N/A 01/15/2018   Procedure: SAVORY DILATION;  Surgeon: Danie Binder, MD;  [facility];  Service: Endoscopy;  Laterality: N/A;   TRANSURETHRAL RESECTION OF PROSTATE N/A 08/29/2021   Procedure: TRANSURETHRAL RESECTION OF THE PROSTATE (TURP);  Surgeon: Cleon Gustin, MD;  [facility];  Service: Urology;  Laterality: N/A;   VIDEO BRONCHOSCOPY WITH ENDOBRONCHIAL ULTRASOUND N/A 04/15/2018   Procedure: VIDEO BRONCHOSCOPY WITH ENDOBRONCHIAL ULTRASOUND;  Surgeon:  Melrose Nakayama, MD;  Location: [facility];  Service: Thoracic;  Laterality: N/A;     Social History:   reports that he quit smoking about 7 years ago. His smoking use included cigarettes. He has a 15.00 pack-year smoking history. He has never used smokeless tobacco. He reports that he does not currently use alcohol. He reports that he does not use drugs.   Family History:  His family history is negative for Colon cancer, Gastric cancer, and Esophageal cancer.   Allergies No Known Allergies   Home Medications  Prior to Admission medications   Medication Sig Start Date End Date Taking? Authorizing Provider  abiraterone acetate (ZYTIGA) 250 MG tablet Take 4 tablets (1,000 mg total) by mouth daily. Take on an empty stomach 1 hour before or 2 hours after a meal 02/09/22  Yes Derek Jack, MD  albuterol (VENTOLIN HFA) 108 (90 Base) MCG/ACT inhaler Inhale 2 puffs into the lungs every 6 (six) hours as needed for wheezing or shortness of breath. 02/08/22  Yes Derek Jack, MD  Calcium Carb-Cholecalciferol (CALCIUM 500 + D PO) Take by mouth.   Yes [provider]  levothyroxine (SYNTHROID) 88 MCG  tablet Take 1 tablet (88 mcg total) by mouth daily before breakfast. 02/02/22  Yes Derek Jack, MD  omeprazole (PRILOSEC) 20 MG capsule Take 1 capsule (20 mg total) by mouth daily. 02/02/22  Yes Derek Jack, MD  predniSONE (DELTASONE) 5 MG tablet Take 1 tablet (5 mg total) by mouth daily with breakfast. 02/07/22  Yes Derek Jack, MD  sildenafil (VIAGRA) 100 MG tablet Take 1 tablet (100 mg total) by mouth as needed for erectile dysfunction. 10/21/21  Yes McKenzie, Candee Furbish, MD  tamsulosin (FLOMAX) 0.4 MG CAPS capsule Take 1 capsule (0.4 mg total) by mouth 2 (two) times daily. 07/01/21  Yes Summerlin, Berneice Heinrich, PA-C  Gemcitabine HCl (GEMZAR IV) Inject into the vein once a week. Days 1 & 8 q 21 days    [provider]  guaiFENesin-dextromethorphan  (ROBITUSSIN DM) 100-10 MG/5ML syrup Take 10 mLs by mouth every 8 (eight) hours as needed for cough. Patient not taking: Reported on 04/03/2022 01/03/22   Derek Jack, MD  HYDROcodone-acetaminophen [facility]) 10-325 MG tablet TAKE 1 TABLET EVERY 12 HOURS AS NEEDED. 03/03/22   Tarri Abernethy M, PA-C  ondansetron (ZOFRAN) 8 MG tablet Take by mouth. Patient not taking: Reported on 04/03/2022 05/01/18   [provider]  prochlorperazine (COMPAZINE) 10 MG tablet Take 1 tablet (10 mg total) by mouth every 6 (six) hours as needed (Nausea or vomiting). Patient not taking: Reported on 11/13/2018 05/01/18 12/04/18  Zoila Shutter, MD     Critical care time: NA     Baltazar Apo, MD, PhD 04/04/2022, 12:03 PM [facility] [phone] or if no answer before 7:00PM call [phone] For any issues after 7:00PM please call eLink [phone]

## 2022-04-04 NOTE — Assessment & Plan Note (Signed)
-   On current treatment with oncology.  New multifocal bone mets noted in pelvis, spine, bilateral ribs.  Also new mediastinal and hilar lymphadenopathy and recent evidence of metastasis to the left kidney. -Currently on degarelix and abiraterone

## 2022-04-04 NOTE — Progress Notes (Signed)
Kept the patient NPO since last night per doctors note, current SBPs is in 90s, will continue to monitor.

## 2022-04-04 NOTE — Progress Notes (Signed)
Pt presented to endoscopy today for bronchoscopy with MD Byrum. Post-operatively, patient's blood pressure was initially 88/45. Subsequent blood pressure at 1520 was 75/45. Fluids open wide and pt placed into trendelenberg position at that time. At 1525, pt remained hypotensive with pressure of 77/44. MDA R. Ola Spurr and MD Byrum notified of pt's hypotension. MD Byrum ordered 500 mL bolus of lactated ringers IV. IV fluids given as ordered. At 1537, MDA R. Fitgerald assessed the pt at the bedside. At 1540, MD Byrum assessed the pt at the bedside. LR bolus was completed at 1600. BP at that time was 104/55. MD Byrum updated. Per MD Byrum, okay to transfer pt to inpt unit. Pt transferred to 5N inpatient unit.  Debarah Crape, RN 04/04/22 4:27 PM

## 2022-04-04 NOTE — Transfer of Care (Signed)
Immediate Anesthesia Transfer of Care Note  Patient: Alexander [name]  Procedure(s) Performed: VIDEO BRONCHOSCOPY WITHOUT FLUORO BRONCHIAL BIOPSIES HEMOSTASIS CONTROL  Patient Location: Endoscopy Unit  Anesthesia Type:General  Level of Consciousness: awake, alert , and oriented  Airway & Oxygen Therapy: Patient Spontanous Breathing and Patient connected to nasal cannula oxygen  Post-op Assessment: Report given to RN and Post -op Vital signs reviewed and stable  Post vital signs: Reviewed and stable  Last Vitals:  Vitals Value Taken Time  BP 77/44 04/04/22 1524  Temp 35.9 C 04/04/22 1512  Pulse 77 04/04/22 1526  Resp 13 04/04/22 1526  SpO2 99 % 04/04/22 1526  Vitals shown include unvalidated device data.  Last Pain:  Vitals:   04/04/22 1520  TempSrc:   PainSc: 0-No pain         Complications: No notable events documented.

## 2022-04-04 NOTE — H&P (View-Only) (Signed)
NAME:  Alexander [name], MRN:  [id number], DOB:  1950-12-28, LOS: 1 ADMISSION DATE:  04/03/2022, CONSULTATION DATE: 04/04/2022 REFERRING MD: Dr. 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  Completed 6 cycles chemotherapy 04/2021, degarelix, abiraterone started 10/2021.  PET scan 09/2021 showed narrowing of his left mainstem bronchus but with no overt occlusion.  He did have new multifocal skeletal metastases, radiotracer avid mediastinal and hilar lymphadenopathy, right hydronephrosis with possible local extension of prostate cancer level of the right distal ureter, intense prostate gland hypermetabolism. He began to experience pain in his left clavicular area between his port and the left neck about 5 days prior to admission.  Then progressive shortness of breath and some left-sided chest discomfort beginning 3 days prior to admission.  CT-PA without any evidence of pulmonary embolism but did show complete left atelectasis, and associated free-flowing left effusion, left mainstem bronchus cutoff sign.  PCCM consulted to help with management  Pertinent  Medical History   Past Medical History:  Diagnosis Date   GERD (gastroesophageal reflux disease)    Mass of neck    dx. oropharyngeal squamous cell carcinoma- Chemo. radiation planned   Oropharyngeal cancer ([place]) 07/28/2014   dx. 3 weeks ago.- Dr. Oneal Deputy [facility] [place], [place].   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  New multifocal skeletal metastases with intense hypermetabolism, significant hypermetabolism prostate, increased right hydronephrosis, question related to local extension of the prostate carcinoma at level of right distal ureter. CT-PA chest 04/03/2022 > no pulmonary embolism, complete left lung atelectasis with soft tissue density in left mainstem bronchus, mediastinal enlargement.  Associated large left pleural effusion.  Question left mainstem mass versus mucous plugging.  Interim History / Subjective:  Patient complains left upper clavicular and lower neck discomfort, warmth Improved shortness of breath.  Still feels like he cannot get a full deep breath.  Minimal left-sided chest pain  Objective   Blood pressure (!) 97/59, pulse 78, temperature 98.4 F (36.9 C), temperature source Oral, resp. rate 16, height 6\' 1"  (1.854 m), weight 54.4 kg, SpO2 99 %.        Intake/Output Summary (Last 24 hours) at 04/04/2022 1141 Last data filed at 04/03/2022 1436 Gross per 24 hour  Intake 1000 ml  Output --  Net 1000 ml   Filed Weights   04/03/22 1049  Weight: 54.4 kg    Examination: General: Cachectic gentleman laying in bed in no distress. HENT: Slight tongue deviation, no mass noted.  Mild dysarthria but easily understood.  Some radiation change left neck, port in place, some mild erythema, warmth, significant tenderness left supraclavicular region Lungs: Minimal air movement left, with some bronchial breath sounds.  Clear on the right. Cardiovascular: Regular, distant, no murmur Abdomen: Nondistended, positive bowel sounds Extremities: No edema Neuro: Awake, alert, interacting appropriately, moves all extremities and follows commands.    Resolved Hospital Problem list     Assessment & Plan:  Complete left atelectasis with left mainstem occlusion  on CT chest.  Looks most consistent with  either mass or external compression from lymphadenopathy.  Consider also mucous plugging. -Discussed pros and cons of inspection bronchoscopy with the patient to determine whether mass or external compression present.  I recommended he agrees to inspection bronchoscopy.  If this is mucous plugging we will try to clear secretions to relieve atelectasis.  More likely this reflects progression of his malignancy.  If there is an endobronchial lesion we will try to biopsy it.  He will likely need palliative XRT to try to open his left mainstem bronchus.  He would be open to this if it is possible to undergo.  He has had documented squamous cell lung cancer in the left mainstem bronchus on biopsy in the past, but if there is no endobronchial lesion then he may need to go back for EBUS, nodal biopsies to differentiate between squamous cell cancer and prostate cancer.  Plan for bronchoscopy this afternoon.  Keep him n.p.o.   Left pleural effusion, likely secondary to left lung atelectasis. -Likely little benefit to be had from drainage as I do not believe the left lung is going to reinflate due to proximal obstruction.  If we are able to determine that there is no proximal obstruction or if this is reversible then thoracentesis would likely be beneficial.  Left periclavicular and lower neck cellulitis -Agree with initiation of antibiotics -May need to consider further neck imaging if discomfort continues, any expansion of skin erythema.    Labs   CBC: Recent Labs  Lab 04/03/22 1127 04/04/22 0336  WBC 7.7 5.9  NEUTROABS 6.2  --   HGB 8.6* 8.4*  HCT 27.8* 26.2*  MCV 90.0 84.8  PLT 212 720    Basic Metabolic Panel: Recent Labs  Lab 04/03/22 1127 04/04/22 0336  NA 132* 133*  K 3.9 3.9  CL 99 101  CO2 20* 22  GLUCOSE 104* 102*  BUN 24* 22  CREATININE 1.93* 1.70*  CALCIUM 7.4* 7.3*   GFR: Estimated Creatinine Clearance: 30.2 mL/min (A) (by C-G formula based on SCr of 1.7 mg/dL  (H)). Recent Labs  Lab 04/03/22 1127 04/04/22 0336  WBC 7.7 5.9    Liver Function Tests: Recent Labs  Lab 04/03/22 1127  AST 16  ALT 9  ALKPHOS 62  BILITOT 0.4  PROT 6.7  ALBUMIN 2.6*   No results for input(s): "LIPASE", "AMYLASE" in the last 168 hours. No results for input(s): "AMMONIA" in the last 168 hours.  ABG No results found for: "PHART", "PCO2ART", "PO2ART", "HCO3", "TCO2", "ACIDBASEDEF", "O2SAT"   Coagulation Profile: No results for input(s): "INR", "PROTIME" in the last 168 hours.  Cardiac Enzymes: No results for input(s): "CKTOTAL", "CKMB", "CKMBINDEX", "TROPONINI" in the last 168 hours.  HbA1C: No results found for: "HGBA1C"  CBG: No results for input(s): "GLUCAP" in the last 168 hours.  Review of Systems:   As per HPI  Past Medical History:  He,  has a past medical history of GERD (gastroesophageal reflux disease), Mass of neck, Oropharyngeal cancer ([place]) (07/28/2014), and 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[place]) (08/06/2014).   Surgical History:   Past Surgical History:  Procedure Laterality Date   BIOPSY  01/15/2018   Procedure: BIOPSY;  Surgeon: Danie Binder, MD;  [facility];  Service: Endoscopy;;  gastric   COLONOSCOPY N/A 03/13/2016   Procedure: COLONOSCOPY;  Surgeon: Danie Binder, MD;  [facility];  Service: Endoscopy;  Laterality: N/A;  2:15 PM  CYSTOSCOPY N/A 08/29/2021   Procedure: CYSTOSCOPY;  Surgeon: Cleon Gustin, MD;  [facility];  Service: Urology;  Laterality: N/A;   CYSTOSCOPY WITH INSERTION OF UROLIFT N/A 06/27/2021   Procedure: CYSTOSCOPY WITH INSERTION OF UROLIFT;  Surgeon: Cleon Gustin, MD;  [facility];  Service: Urology;  Laterality: N/A;   ESOPHAGOGASTRODUODENOSCOPY (EGD) WITH PROPOFOL N/A 08/17/2014   Procedure: ESOPHAGOGASTRODUODENOSCOPY (EGD) WITH PROPOFOL (procedure #1);  Surgeon: Aviva Signs Md, MD;  [facility];  Service: General;  Laterality: N/A;    ESOPHAGOGASTRODUODENOSCOPY (EGD) WITH PROPOFOL N/A 01/15/2018   Procedure: ESOPHAGOGASTRODUODENOSCOPY (EGD) WITH PROPOFOL;  Surgeon: Danie Binder, MD;  [facility];  Service: Endoscopy;  Laterality: N/A;  9:30am   MULTIPLE EXTRACTIONS WITH ALVEOLOPLASTY N/A 08/12/2014   Procedure: Extraction of tooth #'s 6,17,22,23,24,25,26,27 with alveoloplasty;  Surgeon: Lenn Cal, DDS;  Location[facility];  Service: Oral Surgery;  Laterality: N/A;   PANENDOSCOPY N/A 08/06/2014   Procedure: PANENDOSCOPY WITH BIOPSY;  Surgeon: Leta Baptist, MD;  Location: [facility];  Service: ENT;  Laterality: N/A;   PEG PLACEMENT Left 08/17/14   PEG PLACEMENT N/A 08/17/2014   Procedure: PERCUTANEOUS ENDOSCOPIC GASTROSTOMY (PEG) PLACEMENT (procedure #1);  Surgeon: Aviva Signs Md, MD;  [facility];  Service: General;  Laterality: N/A;   PORT-A-CATH REMOVAL Right 07/17/2016   Procedure: MINOR REMOVAL PORT-A-CATH;  Surgeon: Aviva Signs, MD;  [facility];  Service: General;  Laterality: Right;   PORTACATH PLACEMENT Right 08/17/14   PORTACATH PLACEMENT Right 08/17/2014   Procedure: INSERTION PORT-A-CATH (procedure #2);  Surgeon: Aviva Signs Md, MD;  [facility];  Service: General;  Laterality: Right;   PORTACATH PLACEMENT Left 04/26/2018   Procedure: INSERTION PORT-A-CATH (attached catheter in left subclavian);  Surgeon: Aviva Signs, MD;  [facility];  Service: General;  Laterality: Left;   SAVORY DILATION N/A 01/15/2018   Procedure: SAVORY DILATION;  Surgeon: Danie Binder, MD;  [facility];  Service: Endoscopy;  Laterality: N/A;   TRANSURETHRAL RESECTION OF PROSTATE N/A 08/29/2021   Procedure: TRANSURETHRAL RESECTION OF THE PROSTATE (TURP);  Surgeon: Cleon Gustin, MD;  [facility];  Service: Urology;  Laterality: N/A;   VIDEO BRONCHOSCOPY WITH ENDOBRONCHIAL ULTRASOUND N/A 04/15/2018   Procedure: VIDEO BRONCHOSCOPY WITH ENDOBRONCHIAL ULTRASOUND;  Surgeon:  Melrose Nakayama, MD;  Location: [facility];  Service: Thoracic;  Laterality: N/A;     Social History:   reports that he quit smoking about 7 years ago. His smoking use included cigarettes. He has a 15.00 pack-year smoking history. He has never used smokeless tobacco. He reports that he does not currently use alcohol. He reports that he does not use drugs.   Family History:  His family history is negative for Colon cancer, Gastric cancer, and Esophageal cancer.   Allergies No Known Allergies   Home Medications  Prior to Admission medications   Medication Sig Start Date End Date Taking? Authorizing Provider  abiraterone acetate (ZYTIGA) 250 MG tablet Take 4 tablets (1,000 mg total) by mouth daily. Take on an empty stomach 1 hour before or 2 hours after a meal 02/09/22  Yes Derek Jack, MD  albuterol (VENTOLIN HFA) 108 (90 Base) MCG/ACT inhaler Inhale 2 puffs into the lungs every 6 (six) hours as needed for wheezing or shortness of breath. 02/08/22  Yes Derek Jack, MD  Calcium Carb-Cholecalciferol (CALCIUM 500 + D PO) Take by mouth.   Yes [provider]  levothyroxine (SYNTHROID) 88 MCG  tablet Take 1 tablet (88 mcg total) by mouth daily before breakfast. 02/02/22  Yes Derek Jack, MD  omeprazole (PRILOSEC) 20 MG capsule Take 1 capsule (20 mg total) by mouth daily. 02/02/22  Yes Derek Jack, MD  predniSONE (DELTASONE) 5 MG tablet Take 1 tablet (5 mg total) by mouth daily with breakfast. 02/07/22  Yes Derek Jack, MD  sildenafil (VIAGRA) 100 MG tablet Take 1 tablet (100 mg total) by mouth as needed for erectile dysfunction. 10/21/21  Yes McKenzie, Candee Furbish, MD  tamsulosin (FLOMAX) 0.4 MG CAPS capsule Take 1 capsule (0.4 mg total) by mouth 2 (two) times daily. 07/01/21  Yes [place]lin, Berneice Heinrich, PA-C  Gemcitabine HCl (GEMZAR IV) Inject into the vein once a week. Days 1 & 8 q 21 days    [provider]  guaiFENesin-dextromethorphan  (ROBITUSSIN DM) 100-10 MG/5ML syrup Take 10 mLs by mouth every 8 (eight) hours as needed for cough. Patient not taking: Reported on 04/03/2022 01/03/22   Derek Jack, MD  HYDROcodone-acetaminophen [facility]) 10-325 MG tablet TAKE 1 TABLET EVERY 12 HOURS AS NEEDED. 03/03/22   Tarri Abernethy M, PA-C  ondansetron (ZOFRAN) 8 MG tablet Take by mouth. Patient not taking: Reported on 04/03/2022 05/01/18   [provider]  prochlorperazine (COMPAZINE) 10 MG tablet Take 1 tablet (10 mg total) by mouth every 6 (six) hours as needed (Nausea or vomiting). Patient not taking: Reported on 11/13/2018 05/01/18 12/04/18  Zoila Shutter, MD     Critical care time: NA     Baltazar Apo, MD, PhD 04/04/2022, 12:03 PM [facility] [phone] or if no answer before 7:00PM call [phone] For any issues after 7:00PM please call eLink [phone]

## 2022-04-04 NOTE — Anesthesia Procedure Notes (Signed)
Procedure Name: Intubation Date/Time: 04/04/2022 2:42 PM  Performed by: Mariea Clonts, CRNAPre-anesthesia Checklist: Patient identified, Emergency Drugs available, Suction available and Patient being monitored Patient Re-evaluated:Patient Re-evaluated prior to induction Oxygen Delivery Method: Circle System Utilized Preoxygenation: Pre-oxygenation with 100% oxygen Induction Type: IV induction Ventilation: Mask ventilation without difficulty Laryngoscope Size: Glidescope and 4 Grade View: Grade II Tube type: Oral Tube size: 8.5 mm Number of attempts: 1 Airway Equipment and Method: Stylet and Oral airway Placement Confirmation: ETT inserted through vocal cords under direct vision, positive ETCO2 and breath sounds checked- equal and bilateral Tube secured with: Tape Dental Injury: Teeth and Oropharynx as per pre-operative assessment

## 2022-04-04 NOTE — Progress Notes (Signed)
Patient transferred to [facility] ICU stepdown Pod A via [place]. Med necessity and facesheet printed and given to [facility] transport. Report called and given.

## 2022-04-04 NOTE — Anesthesia Preprocedure Evaluation (Signed)
Anesthesia Evaluation  Patient identified by MRN, date of birth, ID band Patient awake    Reviewed: Allergy & Precautions, H&P , NPO status , Patient's Chart, lab work & pertinent test results  Airway Mallampati: II   Neck ROM: full    Dental   Pulmonary former smoker H/o oropharyngeal CA s/p chemo and radiation.  Left mainstem bronchus occlusion with no aeration of left lung.   breath sounds clear to auscultation       Cardiovascular negative cardio ROS  Rhythm:regular Rate:Normal     Neuro/Psych    GI/Hepatic ,GERD  ,,  Endo/Other    Renal/GU Renal InsufficiencyRenal disease     Musculoskeletal   Abdominal   Peds  Hematology  (+) Blood dyscrasia, anemia   Anesthesia Other Findings   Reproductive/Obstetrics                             Anesthesia Physical Anesthesia Plan  ASA: 3  Anesthesia Plan: General   Post-op Pain Management:    Induction:   PONV Risk Score and Plan: 2 and Ondansetron, Dexamethasone and Treatment may vary due to age or medical condition  Airway Management Planned: Oral ETT  Additional Equipment:   Intra-op Plan:   Post-operative Plan: Extubation in OR  Informed Consent: I have reviewed the patients History and Physical, chart, labs and discussed the procedure including the risks, benefits and alternatives for the proposed anesthesia with the patient or authorized representative who has indicated his/her understanding and acceptance.     Dental advisory given  Plan Discussed with: CRNA, Anesthesiologist and Surgeon  Anesthesia Plan Comments:        Anesthesia Quick Evaluation

## 2022-04-04 NOTE — Assessment & Plan Note (Signed)
-   Patient endorsing erythema and tenderness over left chest wall.  Port is on that side but no obvious edema and low suspicion for involvement of port at this time - Start on Rocephin and monitor for improvement - may still need ultrasound if suspicion increases for port involvement

## 2022-04-04 NOTE — Interval H&P Note (Signed)
History and Physical Interval Note:  04/04/2022 2:09 PM  Alexander [name]  has presented today for surgery, with the diagnosis of L main stem occlusion/mass.  The various methods of treatment have been discussed with the patient and family. After consideration of risks, benefits and other options for treatment, the patient has consented to  Procedure(s): VIDEO BRONCHOSCOPY WITHOUT FLUORO (N/A) as a surgical intervention.  The patient's history has been reviewed, patient examined, no change in status, stable for surgery.  I have reviewed the patient's chart and labs.  Questions were answered to the patient's satisfaction.     Collene Gobble

## 2022-04-05 ENCOUNTER — Ambulatory Visit
Admit: 2022-04-05 | Discharge: 2022-04-05 | Disposition: A | Discharge status: Home / Self Care | Payer: Medicare Other | Attending: Radiation Oncology | Admitting: Radiation Oncology

## 2022-04-05 ENCOUNTER — Inpatient Hospital Stay (HOSPITAL_COMMUNITY): Payer: 59

## 2022-04-05 ENCOUNTER — Ambulatory Visit
Admit: 2022-04-05 | Discharge: 2022-04-05 | Disposition: A | Discharge status: Home / Self Care | Payer: 59 | Attending: Radiation Oncology | Admitting: Radiation Oncology

## 2022-04-05 ENCOUNTER — Encounter: Payer: Self-pay | Admitting: Radiation Oncology

## 2022-04-05 ENCOUNTER — Other Ambulatory Visit: Payer: Self-pay

## 2022-04-05 DIAGNOSIS — C3412 Malignant neoplasm of upper lobe, left bronchus or lung: Secondary | ICD-10-CM

## 2022-04-05 DIAGNOSIS — C3492 Malignant neoplasm of unspecified part of left bronchus or lung: Secondary | ICD-10-CM

## 2022-04-05 DIAGNOSIS — J9811 Atelectasis: Secondary | ICD-10-CM | POA: Diagnosis not present

## 2022-04-05 DIAGNOSIS — R918 Other nonspecific abnormal finding of lung field: Secondary | ICD-10-CM | POA: Diagnosis not present

## 2022-04-05 LAB — CBC WITH DIFFERENTIAL/PLATELET
Abs Immature Granulocytes: 0.02 10*3/uL (ref 0.00–0.07)
Abs Immature Granulocytes: 0.02 10*3/uL (ref 0.00–0.07)
Basophils Absolute: 0 10*3/uL (ref 0.0–0.1)
Basophils Absolute: 0 10*3/uL (ref 0.0–0.1)
Basophils Relative: 0 %
Basophils Relative: 0 %
Eosinophils Absolute: 0 10*3/uL (ref 0.0–0.5)
Eosinophils Absolute: 0 10*3/uL (ref 0.0–0.5)
Eosinophils Relative: 1 %
Eosinophils Relative: 1 %
HCT: 26.4 % — ABNORMAL LOW (ref 39.0–52.0)
HCT: 26.4 % — ABNORMAL LOW (ref 39.0–52.0)
Hemoglobin: 8.2 g/dL — ABNORMAL LOW (ref 13.0–17.0)
Hemoglobin: 8.2 g/dL — ABNORMAL LOW (ref 13.0–17.0)
Immature Granulocytes: 0 %
Immature Granulocytes: 0 %
Lymphocytes Relative: 11 %
Lymphocytes Relative: 11 %
Lymphs Abs: 0.6 10*3/uL — ABNORMAL LOW (ref 0.7–4.0)
Lymphs Abs: 0.6 10*3/uL — ABNORMAL LOW (ref 0.7–4.0)
MCH: 27 pg (ref 26.0–34.0)
MCH: 27 pg (ref 26.0–34.0)
MCHC: 31.1 g/dL (ref 30.0–36.0)
MCHC: 31.1 g/dL (ref 30.0–36.0)
MCV: 86.8 fL (ref 80.0–100.0)
MCV: 86.8 fL (ref 80.0–100.0)
Monocytes Absolute: 0.7 10*3/uL (ref 0.1–1.0)
Monocytes Absolute: 0.7 10*3/uL (ref 0.1–1.0)
Monocytes Relative: 12 %
Monocytes Relative: 12 %
Neutro Abs: 4.4 10*3/uL (ref 1.7–7.7)
Neutro Abs: 4.4 10*3/uL (ref 1.7–7.7)
Neutrophils Relative %: 76 %
Neutrophils Relative %: 76 %
Platelets: 200 10*3/uL (ref 150–400)
Platelets: 200 10*3/uL (ref 150–400)
RBC: 3.04 MIL/uL — ABNORMAL LOW (ref 4.22–5.81)
RBC: 3.04 MIL/uL — ABNORMAL LOW (ref 4.22–5.81)
RDW: 17.2 % — ABNORMAL HIGH (ref 11.5–15.5)
RDW: 17.2 % — ABNORMAL HIGH (ref 11.5–15.5)
WBC: 5.7 10*3/uL (ref 4.0–10.5)
WBC: 5.7 10*3/uL (ref 4.0–10.5)
nRBC: 0 % (ref 0.0–0.2)
nRBC: 0 % (ref 0.0–0.2)

## 2022-04-05 LAB — RAD ONC ARIA SESSION SUMMARY
Course Elapsed Days: 0
Plan Fractions Treated to Date: 1
Plan Prescribed Dose Per Fraction: 3 Gy
Plan Total Fractions Prescribed: 10
Plan Total Prescribed Dose: 30 Gy
Reference Point Dosage Given to Date: 3 Gy
Reference Point Session Dosage Given: 3 Gy
Session Number: 1

## 2022-04-05 LAB — BASIC METABOLIC PANEL
Anion gap: 6 (ref 5–15)
Anion gap: 6 (ref 5–15)
BUN: 20 mg/dL (ref 8–23)
BUN: 20 mg/dL (ref 8–23)
CO2: 23 mmol/L (ref 22–32)
CO2: 23 mmol/L (ref 22–32)
Calcium: 7.1 mg/dL — ABNORMAL LOW (ref 8.9–10.3)
Calcium: 7.1 mg/dL — ABNORMAL LOW (ref 8.9–10.3)
Chloride: 105 mmol/L (ref 98–111)
Chloride: 105 mmol/L (ref 98–111)
Creatinine, Ser: 1.37 mg/dL — ABNORMAL HIGH (ref 0.61–1.24)
Creatinine, Ser: 1.37 mg/dL — ABNORMAL HIGH (ref 0.61–1.24)
GFR calc Af Amer: 55 mL/min — ABNORMAL LOW (ref >60)
GFR, Estimated: 55 mL/min — ABNORMAL LOW (ref >60)
Glucose, Bld: 97 mg/dL (ref 70–99)
Glucose, Bld: 97 mg/dL (ref 70–99)
Potassium: 3.8 mmol/L (ref 3.5–5.1)
Potassium: 3.8 mmol/L (ref 3.5–5.1)
Sodium: 134 mmol/L — ABNORMAL LOW (ref 135–145)
Sodium: 134 mmol/L — ABNORMAL LOW (ref 135–145)

## 2022-04-05 LAB — MAGNESIUM
Magnesium: 1.8 mg/dL (ref 1.7–2.4)
Magnesium: 1.8 mg/dL (ref 1.7–2.4)

## 2022-04-05 MED ORDER — ARFORMOTEROL TARTRATE 15 MCG/2ML IN NEBU: 15.0000 ug | INHALATION_SOLUTION | Freq: Two times a day (BID) | RESPIRATORY_TRACT | Status: DC

## 2022-04-05 MED ORDER — REVEFENACIN 175 MCG/3ML IN SOLN: 175.0000 ug | Freq: Every day | RESPIRATORY_TRACT | Status: DC

## 2022-04-05 MED ORDER — SODIUM CHLORIDE 0.9 % IV SOLN: INTRAVENOUS | Status: AC

## 2022-04-05 MED ORDER — GADOBUTROL 1 MMOL/ML IV SOLN
5.5000 mL | Freq: Once | INTRAVENOUS | Status: AC | PRN
  Administered 2022-04-05: 5.5 mL via INTRAVENOUS

## 2022-04-05 NOTE — Progress Notes (Signed)
Thoracic Location of Tumor / Histology:  left squamous cell lung cancer   Patient presented with symptoms of:  Findings:      Left Lung Abnormalities: A completely obstructing mass was found       proximally, at the orifice in the left mainstem bronchus. The mass was       large and bloody, endobronchial, exophytic, friable, polypoid and       raised. The lesion was not traversed. There was some extension of the       mass towards the R mainstem, and down the inferior wall of the R       mainstem with 50% occlusion. The scope would pass the lesion into the R       mainstem. The RUL airway was somewhat edematous and narrowed but patent.       The RML and RLL airways were normal.      Endobronchial biopsies of a mass were performed in the left mainstem       bronchus using a forceps and sent for histopathology examination. Two       samples were obtained. Impression:            - Left mainstem mass                        - A bloody, endobronchial, exophytic, friable,                         polypoid and raised mass was found in the left                         mainstem bronchus. This lesion is malignant.                        - Endobronchial biopsies were performed. Moderate Sedation:      Performed under general anesthesia Recommendation:        - Refer to/consult with Radiation Oncology.   Biopsies revealed: Surgical pathology collected on 04-04-22  Tobacco/Marijuana/Snuff/ETOH use: former smoker  Past/Anticipated interventions by cardiothoracic surgery, if any: yes [facility] Cardiop[facility] Patient Name: Alexander [name] Date: 04/04/2022 MRN: [id number] Attending MD: Collene Gobble , MD,  Date of Birth: Jan 22, 1951 CSN: Finalized Age: 72 Admit Type: Inpatient Gender: Male Procedure:             Bronchoscopy Indications:           Left mainstem mass Providers:             Collene Gobble, MD,  Past/Anticipated interventions by medical oncology, if  any:  ASSESSMENT:  1.  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 -6 cycles of carboplatin, paclitaxel and pembrolizumab from 05/03/2018 through 08/21/2018. -Maintenance pembrolizumab started on 09/11/2018. -PET scan on 09/15/2019 showed interval decrease in hypermetabolic areas associated with tongue and floor of the mouth.  Hypermetabolic metastatic lymphadenopathy in the chest is stable.  No new sites seen. -PET scan on 03/15/2020 shows persistent, stable hypermetabolism in the tongue/floor of mouth.  Slight interval decrease in hypermetabolism with mediastinal/hilar adenopathy.  Persistent hypermetabolic focus in the right supraclavicular region.  New focus of hypermetabolic them identified in the right external iliac chain of pelvis with no discernible adenopathy on the CT. -CT CAP on 11/24/2020 showed left lower lobe lung  nodule measuring 1.3 x 1.1 cm, previously 1.0 x 0.9 cm on CT scan from June.  Lesion was negative on PSMA PET scan.  Stable mediastinal lymph nodes. 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 - 6 cycles of carboplatin, paclitaxel and pembrolizumab from 01/12/2021 through 05/04/2021 with progression. - NGS test: No targetable mutations.  PD-L1 TPS is negative.  MSI-stable.  TMB-low. - Gemcitabine 2 weeks on/1 week off started on 07/05/2021.  Signs/Symptoms Weight changes, if any:  Respiratory complaints, if any:  Hemoptysis, if any:  Pain issues, if any:    SAFETY ISSUES: Prior radiation? yes Pacemaker/ICD? no Possible current pregnancy? no Is the patient on methotrexate? no  Current Complaints / other details: Dr. Emi Belfast to see in CT Sim for consult

## 2022-04-05 NOTE — Progress Notes (Addendum)
BSE completed, full report to follow. Observed pt with meal, frequent throat clearing, occasional cough and piecemealing noted. No expectoration, incr work of breathing nor change in vitals.     Pt has chronic dysphagia from 2016 - for which he has tolerated.  He is demonstrating clinical indications of oropharyngeal and suspected esophageal dysphagia for which he compensates by self created compensations including consuming softer meats, piecemealing and drinking liquids.  SLP recommends MBS while pt here to determine if any compensation strategies may be helpful - pt does not want diet restriction and SLP respects his wishes.  He will willing to conduct MBS and will tentatively plan 04/06/2022.  Kathleen Lime, MS [facility] SLP Acute Rehab Services Office [phone] Pager [phone]

## 2022-04-05 NOTE — Progress Notes (Addendum)
NAME:  Alexander [name], MRN:  [id number], DOB:  Dec 27, 1950, LOS: 2 ADMISSION DATE:  04/03/2022, CONSULTATION DATE: 04/04/2022 REFERRING MD: Dr. 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  Completed 6 cycles chemotherapy 04/2021, degarelix, abiraterone started 10/2021.  PET scan 09/2021 showed narrowing of his left mainstem bronchus but with no overt occlusion.  He did have new multifocal skeletal metastases, radiotracer avid mediastinal and hilar lymphadenopathy, right hydronephrosis with possible local extension of prostate cancer level of the right distal ureter, intense prostate gland hypermetabolism. He began to experience pain in his left clavicular area between his port and the left neck about 5 days prior to admission.  Then progressive shortness of breath and some left-sided chest discomfort beginning 3 days prior to admission.  CT-PA without any evidence of pulmonary embolism but did show complete left atelectasis, and associated free-flowing left effusion, left mainstem bronchus cutoff sign.  PCCM consulted to help with management  Pertinent  Medical History   Past Medical History:  Diagnosis Date   GERD (gastroesophageal reflux disease)    Mass of neck    dx. oropharyngeal squamous cell carcinoma- Chemo. radiation planned   Oropharyngeal cancer ([place]) 07/28/2014   dx. 3 weeks ago.- Dr. Oneal Deputy center [place], [place].   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  New multifocal skeletal metastases with intense hypermetabolism, significant hypermetabolism prostate, increased right hydronephrosis, question related to local extension of the prostate carcinoma at level of right distal ureter. CT-PA chest 04/03/2022 > no pulmonary embolism, complete left lung atelectasis with soft tissue density in left mainstem bronchus, mediastinal enlargement.  Associated large left pleural effusion.  Question left mainstem mass versus mucous plugging. 04/04/22 bronch/endobronchial bx of LMB, RMB endobronchial tumor  Interim History / Subjective:  Surprisingly says he feels like his dyspnea is better today. Says pain near port is improved.   Objective   Blood pressure (!) 99/41, pulse 82, temperature 98.4 F (36.9 C), temperature source Oral, resp. rate 19, height 6\' 1"  (1.854 m), weight 54.4 kg, SpO2 100 %.        Intake/Output Summary (Last 24 hours) at 04/05/2022 0953 Last data filed at 04/05/2022 0900 Gross per 24 hour  Intake 500 ml  Output 500 ml  Net 0 ml   Filed Weights   04/03/22 1049  Weight: 54.4 kg    Examination: General appearance: 72 y.o., male, NAD, conversant  Eyes: anicteric sclerae; PERRL, tracking appropriately HENT: NCAT; MMM Neck: Trachea midline; no lymphadenopathy, no JVD Lungs: CTAB, no crackles, no wheeze, with normal respiratory effort CV: RRR, no murmur  Abdomen: Soft, non-tender; non-distended, BS present  Extremities: No peripheral edema, warm Skin: Normal turgor and texture; no rash Psych: Appropriate affect Neuro: Alert and oriented to person and place, no focal deficit   Surg path 1/9 pending CBC stable  No new imaging  Resolved Hospital Problem list     Assessment & Plan:  Complete left atelectasis with left mainstem occlusion on  CT chest  Mediastinal/paratracheal mass 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  Will sign off but glad to be reinvolved for consideration of thora if persistent dyspnea following XRT, etc   Critical care time: NA     Fredirick [facility]lin [facility]  04/05/2022, 9:53 AM [facility] [phone] or if no answer before 7:00PM call [phone] For any issues after 7:00PM please call eLink [phone]

## 2022-04-05 NOTE — Progress Notes (Signed)
When patient was given a liquid drink this AM, patient automatically began to cough/choke on liquid despite being positioned fully upright. Patient encouraged to cough and this nurse placed a speech evaluation consult. Patient was agreeable to this consult. Informed Dr. Posey Pronto.

## 2022-04-05 NOTE — Progress Notes (Signed)
Prior-To-Admission Oral Chemotherapy for Treatment of Oncologic Disease   Order noted from Dr. Denton Brick to continue prior-to-admission oral chemotherapy regimen of Zytiga .  Procedure Per Pharmacy & Therapeutics Committee Policy: Orders for continuation of home oral chemotherapy for treatment of an oncologic disease will be held unless approved by an oncologist during current admission.    For patients receiving oncology care at [facility], inpatient pharmacist contacts patient's oncologist during regular office hours to review. If earlier review is medically necessary, attending physician consults [facility] on-call oncologist   For patients receiving oncology care outside of [facility], attending physician consults patient's oncologist to review. If this oncologist or their coverage cannot be reached, attending physician consults [facility] on-call oncologist   I reached out to Dr Delton Coombes, who stated it was okay to continue med while inpatient    Oral chemotherapy continuation order is to be continued as per oncologist's instructions       [name], PharmD, BCPS 04/05/2022 9:20 AM

## 2022-04-05 NOTE — Anesthesia Postprocedure Evaluation (Signed)
Anesthesia Post Note  Patient: Alexander [name]  Procedure(s) Performed: VIDEO BRONCHOSCOPY WITHOUT FLUORO BRONCHIAL BIOPSIES HEMOSTASIS CONTROL     Patient location during evaluation: PACU Anesthesia Type: General Level of consciousness: awake and alert Pain management: pain level controlled Vital Signs Assessment: post-procedure vital signs reviewed and stable Respiratory status: spontaneous breathing, nonlabored ventilation, respiratory function stable and patient connected to nasal cannula oxygen Cardiovascular status: blood pressure returned to baseline and stable Postop Assessment: no apparent nausea or vomiting Anesthetic complications: no   No notable events documented.  Last Vitals:  Vitals:   04/05/22 0421 04/05/22 0756  BP:    Pulse:  82  Resp:  19  Temp: 36.8 C   SpO2:  100%    Last Pain:  Vitals:   04/05/22 0756  TempSrc:   PainSc: 0-No pain                 Wrangler Penning S

## 2022-04-05 NOTE — Evaluation (Signed)
Clinical/Bedside Swallow Evaluation Patient Details  Name: Alexander [name] MRN: [id number] Date of Birth: 07-Sep-1950  Today's Date: 04/05/2022 Time: SLP Start Time (ACUTE ONLY): 68 SLP Stop Time (ACUTE ONLY): 1100 SLP Time Calculation (min) (ACUTE ONLY): 20 min  Past Medical History:  Past Medical History:  Diagnosis Date   GERD (gastroesophageal reflux disease)    Mass of neck    dx. oropharyngeal squamous cell carcinoma- Chemo. radiation planned   Oropharyngeal cancer ([place]) 07/28/2014   dx. 3 weeks ago.- Dr. Oneal Deputy center [place], [place].   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   SCCa of Left BOT   Past Surgical History:  Past Surgical History:  Procedure Laterality Date   BIOPSY  01/15/2018   Procedure: BIOPSY;  Surgeon: Danie Binder, MD;  [facility];  Service: Endoscopy;;  gastric   COLONOSCOPY N/A 03/13/2016   Procedure: COLONOSCOPY;  Surgeon: Danie Binder, MD;  [facility];  Service: Endoscopy;  Laterality: N/A;  2:15 PM   CYSTOSCOPY N/A 08/29/2021   Procedure: CYSTOSCOPY;  Surgeon: Cleon Gustin, MD;  Location: [facility];  Service: Urology;  Laterality: N/A;   CYSTOSCOPY WITH INSERTION OF UROLIFT N/A 06/27/2021   Procedure: CYSTOSCOPY WITH INSERTION OF UROLIFT;  Surgeon: Cleon Gustin, MD;  Location: [facility];  Service: Urology;  Laterality: N/A;   ESOPHAGOGASTRODUODENOSCOPY (EGD) WITH PROPOFOL N/A 08/17/2014   Procedure: ESOPHAGOGASTRODUODENOSCOPY (EGD) WITH PROPOFOL (procedure #1);  Surgeon: Aviva Signs Md, MD;  Location: [facility];  Service: General;  Laterality: N/A;   ESOPHAGOGASTRODUODENOSCOPY (EGD) WITH PROPOFOL N/A 01/15/2018   Procedure: ESOPHAGOGASTRODUODENOSCOPY (EGD) WITH PROPOFOL;  Surgeon: Danie Binder, MD;  [facility];  Service: Endoscopy;  Laterality: N/A;  9:30am   MULTIPLE EXTRACTIONS WITH ALVEOLOPLASTY N/A 08/12/2014   Procedure: Extraction of tooth #'s 6,17,22,23,24,25,26,27 with  alveoloplasty;  Surgeon: Lenn Cal, DDS;  Location: [facility];  Service: Oral Surgery;  Laterality: N/A;   PANENDOSCOPY N/A 08/06/2014   Procedure: PANENDOSCOPY WITH BIOPSY;  Surgeon: Leta Baptist, MD;  Location: [place];  Service: ENT;  Laterality: N/A;   PEG PLACEMENT Left 08/17/14   PEG PLACEMENT N/A 08/17/2014   Procedure: PERCUTANEOUS ENDOSCOPIC GASTROSTOMY (PEG) PLACEMENT (procedure #1);  Surgeon: Aviva Signs Md, MD;  Location: [facility];  Service: General;  Laterality: N/A;   PORT-A-CATH REMOVAL Right 07/17/2016   Procedure: MINOR REMOVAL PORT-A-CATH;  Surgeon: Aviva Signs, MD;  Location: [facility];  Service: General;  Laterality: Right;   PORTACATH PLACEMENT Right 08/17/14   PORTACATH PLACEMENT Right 08/17/2014   Procedure: INSERTION PORT-A-CATH (procedure #2);  Surgeon: Aviva Signs Md, MD;  Location: [facility];  Service: General;  Laterality: Right;   PORTACATH PLACEMENT Left 04/26/2018   Procedure: INSERTION PORT-A-CATH (attached catheter in left subclavian);  Surgeon: Aviva Signs, MD;  Location: [facility];  Service: General;  Laterality: Left;   SAVORY DILATION N/A 01/15/2018   Procedure: SAVORY DILATION;  Surgeon: Danie Binder, MD;  [facility];  Service: Endoscopy;  Laterality: N/A;   TRANSURETHRAL RESECTION OF PROSTATE N/A 08/29/2021   Procedure: TRANSURETHRAL RESECTION OF THE PROSTATE (TURP);  Surgeon: Cleon Gustin, MD;  Location: [facility];  Service: Urology;  Laterality: N/A;   VIDEO BRONCHOSCOPY WITH ENDOBRONCHIAL ULTRASOUND N/A 04/15/2018   Procedure: VIDEO BRONCHOSCOPY WITH ENDOBRONCHIAL ULTRASOUND;  Surgeon: Melrose Nakayama, MD;  Location: [facility] OR;  Service: Thoracic;  Laterality: N/A;   HPI:  Per CCM note - "72 year old male  with history of 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   Completed 6 cycles chemotherapy 04/2021, degarelix, abiraterone started 10/2021.  PET scan 09/2021 showed narrowing of his left mainstem bronchus but with no overt occlusion.  He did have new multifocal skeletal metastases, radiotracer avid mediastinal and hilar lymphadenopathy, right hydronephrosis with possible local extension of prostate cancer level of the right distal ureter, intense prostate gland hypermetabolism.  He began to experience pain in his left clavicular area between his port and the left neck about 5 days prior to admission.  Then progressive shortness of breath and some left-sided chest discomfort beginning 3 days prior to admission.  CT-PA without any evidence of pulmonary embolism but did show complete left atelectasis, and associated free-flowing left effusion, left mainstem bronchus cutoff sign."  Pt is s/p bronch.  Imaging also showed "There is air-fluid level in thoracic esophagus suggesting  gastroesophageal reflux. "  Pt reports he takes a PPI =    Assessment / Plan / Recommendation  Clinical Impression  Pt has chronic dysphagia due to iatrogenic impacts of XRT for his oropharyngeal cancer from 2016 - - for which he has tolerated.  He is demonstrating clinical indications of oropharyngeal and suspected esophageal dysphagia for which he compensates by self created compensations including consuming softer meats, piecemealing and drinking liquids during meals.   Lingual atrophy noted on left, decreased lingual strength on left with lingual deviation upon protrusion.  He is observed to clear his throat across all po *observed with breakfast* but does not demonstrate clinical indications of overt aspiration. Pt denies issues with requiring heimlich manuever, having recurrent pneumonias. He admits to weight loss which he attributes to poor appetite, but denies dysphagia impacting po intake.  SLP educated pt to impacts of XRT for his cancer contributing to fibrosis likely impacting pharyngeal  clearance and contributing to aspiration.  SLP recommends MBS to instrumentally evaluate pt's swallowing and hopefully compensation strategies may be helpful - pt does not want diet restriction and SLP respects his wishes.  He is willing to conduct MBS and will tentatively plan 04/06/2022. SLP Visit Diagnosis: Dysphagia, oropharyngeal phase (R13.12);Dysphagia, pharyngoesophageal phase (R13.14)    Aspiration Risk  Risk for inadequate nutrition/hydration;Moderate aspiration risk    Diet Recommendation Regular;Thin liquid   Liquid Administration via: Cup;Straw Medication Administration: Other (Comment) (defer to pt) Supervision: Patient able to self feed Compensations: Minimize environmental distractions;Slow rate;Small sips/bites Postural Changes: Seated upright at 90 degrees;Remain upright for at least 30 minutes after po intake    Other  Recommendations Oral Care Recommendations: Oral care BID    Recommendations for follow up therapy are one component of a multi-disciplinary discharge planning process, led by the attending physician.  Recommendations may be updated based on patient status, additional functional criteria and insurance authorization.  Follow up Recommendations    TBD    Assistance Recommended at Discharge  TBD  Functional Status Assessment Patient has had a recent decline in their functional status and demonstrates the ability to make significant improvements in function in a reasonable and predictable amount of time.  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   Completed 6 cycles chemotherapy 04/2021, degarelix, abiraterone started 10/2021.  PET scan 09/2021 showed narrowing of his left mainstem bronchus but with no overt occlusion.  He did have new multifocal skeletal metastases, radiotracer avid mediastinal and hilar lymphadenopathy, right hydronephrosis with possible local extension of prostate cancer level of the right distal ureter, intense prostate gland hypermetabolism.  He began to experience pain in his left clavicular area between his port and the left neck about 5 days prior to admission.  Then progressive shortness of breath and some left-sided chest discomfort beginning 3 days prior to admission.  CT-PA without any evidence of pulmonary embolism but did show complete left atelectasis, and associated free-flowing left effusion, left mainstem bronchus cutoff sign."  Pt is s/p bronch.  Imaging also showed "There is air-fluid level in thoracic esophagus suggesting  gastroesophageal reflux. "  Pt reports he takes a PPI = Type of Study: Bedside Swallow Evaluation Diet Prior to this Study: Regular;Thin liquids Temperature Spikes Noted: No Respiratory Status: Room air History of Recent Intubation: No Behavior/Cognition: Alert;Cooperative;Pleasant mood Oral Cavity Assessment: Within Functional Limits Oral Care Completed by SLP: No Oral Cavity - Dentition: Dentures, top;Dentures, bottom Vision: Functional for self-feeding Self-Feeding Abilities: Able to feed self Patient Positioning: Upright in bed Baseline Vocal Quality: Hoarse Volitional Cough: Strong Volitional Swallow: Able to elicit    Oral/Motor/Sensory Function Overall Oral Motor/Sensory Function: Moderate impairment Facial ROM: Within Functional Limits Facial Symmetry: Within Functional Limits Facial Strength: Within Functional Limits Facial Sensation: Within Functional Limits Lingual ROM: Reduced left Lingual Symmetry: Abnormal symmetry left Lingual Strength: Reduced Lingual Sensation:  Other (Comment) (DNT) Velum: Impaired left Mandible: Within Functional Limits   Ice Chips Ice chips: Not tested   Thin Liquid Thin Liquid: Impaired Presentation: Cup;Self Fed Pharyngeal  Phase Impairments: Multiple swallows;Throat Clearing - Immediate;Throat Clearing - Delayed;Cough - Delayed    Nectar Thick Nectar Thick Liquid: Not tested   Honey Thick Honey Thick Liquid: Not tested   Puree Puree: Not tested   Solid     Solid: Impaired Presentation: Self Fed Oral Phase Functional Implications: Prolonged oral transit;Left lateral sulci pocketing;Oral residue Pharyngeal Phase Impairments: Suspected delayed Swallow;Throat Clearing - Immediate      Macario Golds 04/05/2022,4:50 PM  Kathleen Lime, MS [facility] SLP Acute Rehab Services Office [phone] Pager [phone]

## 2022-04-05 NOTE — Progress Notes (Signed)
  Interdisciplinary Goals of Care Family Meeting   Date carried out:: 04/05/2022  Location of the meeting: Bedside  Member's involved: Physician  Durable Power of Attorney or acting medical decision maker: Mr. Roupp    Discussion: We discussed goals of care for Alexander [name]. He would want to be full code if recovery to current level of functional status could be guaranteed. However if he sustained cardiac arrest or developed respiratory failure his likelihood of recovery to functional independence would be probably be low in setting of his cancer and frailty. He is DNR/DNI. Transfer to ICU would be ok, pressors would be ok but there is not code status option in Epic for me to select that reflects this.  Code status: Full DNR  Disposition: Continue current acute care   Time spent for the meeting: 15 minutes  Maryjane Hurter 04/05/2022, 12:35 PM

## 2022-04-05 NOTE — Progress Notes (Signed)
[facility] Progress Note Patient: Alexander [name] GUY:[id number] DOB: 08-03-1950 DOA: 04/03/2022  DOS: the patient was seen and examined on 04/05/2022  Brief hospital course: Mr. Latouche is a 72 yo male with PMH left lung cancer, oropharyngeal cancer (SCC), prostate cancer, GERD, CKD3b who presented with pain over his left chest wall and redness for 4 to 5 days and short of breath.  Also had left shoulder pain. Outpatient oncologist Dr. Delton Coombes and his last chemotherapy was gemcitabine from April through July 2023.  He has been on Degarelix and Abiraterone currently for prostate cancer treatment.  CTA chest then obtained which was negative for PE and showed complete atelectasis of left lung.  Underwent bronchoscopy at [facility], found to have mass obstructing left main bronchus.  Currently at [facility] for radiation Assessment and Plan: Endobronchial tumor with complete occlusion of the left mainstem and 50% occlusion of the right mainstem. Left pleural effusion Left lung atelectasis. Underwent endobronchial biopsy.  Results currently pending. Radiation oncology consulted, currently undergoing urgent XRT. Continue bronchodilators. 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  Left periclavicular and lower leg cellulitis. Patient has mild skin erythema and warmth on the left upper chest and shoulder area. Reports significant pain while raising his shoulder above 90 degree. Will perform MRI shoulder for further workup. Currently on IV ceftriaxone.  Continue.  Goals of care conversation. PCCM involved, discussed with patient currently DNR/DNI. Okay for pressors.  Okay for ICU therapy.  Left lung squamous cell cancer. Metastatic prostate cancer. 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 Follows up with Dr. Raliegh Ip. Currently on therapy for prostate cancer with Zytiga prednisone and degarelix. New  multifocal bone mass noted in the pelvic spine and bilateral rib area as well as mediastinal and hilar lymphadenopathy as well as masses to left kidney.  Hypotension. After bronchoscopy. Patient currently asymptomatic. Will provide IV fluid and monitor. Less likely septic.  Monitor.  Dysphagia. Speech therapy consulted.  Known history of dysphagia since 2016. Patient understand the risk or continuing current diet but does not want to change his diet consistency for now. MBS ordered for tomorrow.  AKI on CKD 3B Baseline serum creatinine around 1.9-2.3. After receiving IV fluid currently serum creatinine 1.37. Monitor for now.  Iron deficiency anemia. H&H relatively stable. Monitor.  Underweight.  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  Placing the patient at high risk for poor outcome. Will consult pulm palliative care.   Subjective: No acute complaint.  Continues to have pain in his left shoulder.  Continues to have some pain in the left chest area as well.  No nausea no vomiting no fever no chills.  No dizziness or lightheadedness.  Physical Exam: General: in Mild distress, No Rash, mild erythema of the left chest area Cardiovascular: S1 and S2 Present, No Murmur Respiratory: Good respiratory effort, Bilateral Air entry present. No Crackles, No wheezes Abdomen: Bowel Sound present, No tenderness Extremities: No edema Neuro: Alert and oriented x3, no new focal deficit  Data Reviewed: I have Reviewed nursing notes, Vitals, and Lab results. Since last encounter, pertinent lab results CBC and BMP   . I have ordered test including CBC and BMP  . I have discussed pt's care plan and test results with pulmonary/ICU care  . I have ordered imaging MRI left shoulder  .   Disposition: Status is: Inpatient Remains inpatient  appropriate because: Still need IV antibiotics and hypotension correction  SCDs Start: 04/03/22 1858    Family Communication: No one at bedside Level of care: Stepdown continue stepdown unit due to soft blood pressure Vitals:   04/05/22 1500 04/05/22 1600 04/05/22 1627 04/05/22 1700  BP:  (!) 83/47 (!) 92/53 (!) 106/56  Pulse: 81   79  Resp: (!) 22 20  (!) 21  Temp:  97.9 F (36.6 C)    TempSrc:  Axillary    SpO2: 100% 100%  100%  Weight:      Height:         Author: Berle Mull, MD 04/05/2022 6:38 PM  Please look on [email or web address] to find out who is on call.

## 2022-04-06 ENCOUNTER — Other Ambulatory Visit: Payer: Self-pay

## 2022-04-06 ENCOUNTER — Inpatient Hospital Stay (HOSPITAL_COMMUNITY): Payer: 59

## 2022-04-06 ENCOUNTER — Ambulatory Visit
Admit: 2022-04-06 | Discharge: 2022-04-06 | Disposition: A | Discharge status: Home / Self Care | Payer: 59 | Attending: Radiation Oncology | Admitting: Radiation Oncology

## 2022-04-06 DIAGNOSIS — J9811 Atelectasis: Secondary | ICD-10-CM | POA: Diagnosis not present

## 2022-04-06 LAB — RAD ONC ARIA SESSION SUMMARY
Course Elapsed Days: 1
Plan Fractions Treated to Date: 2
Plan Prescribed Dose Per Fraction: 3 Gy
Plan Total Fractions Prescribed: 10
Plan Total Prescribed Dose: 30 Gy
Reference Point Dosage Given to Date: 6 Gy
Reference Point Session Dosage Given: 3 Gy
Session Number: 2

## 2022-04-06 LAB — CBC WITH DIFFERENTIAL/PLATELET
Abs Immature Granulocytes: 0.02 10*3/uL (ref 0.00–0.07)
Basophils Absolute: 0 10*3/uL (ref 0.0–0.1)
Basophils Relative: 0 %
Eosinophils Absolute: 0.1 10*3/uL (ref 0.0–0.5)
Eosinophils Relative: 1 %
HCT: 26.2 % — ABNORMAL LOW (ref 39.0–52.0)
Hemoglobin: 8.1 g/dL — ABNORMAL LOW (ref 13.0–17.0)
Immature Granulocytes: 0 %
Lymphocytes Relative: 8 %
Lymphs Abs: 0.6 10*3/uL — ABNORMAL LOW (ref 0.7–4.0)
MCH: 27 pg (ref 26.0–34.0)
MCHC: 30.9 g/dL (ref 30.0–36.0)
MCV: 87.3 fL (ref 80.0–100.0)
Monocytes Absolute: 0.7 10*3/uL (ref 0.1–1.0)
Monocytes Relative: 10 %
Neutro Abs: 5.7 10*3/uL (ref 1.7–7.7)
Neutrophils Relative %: 81 %
Platelets: 207 10*3/uL (ref 150–400)
RBC: 3 MIL/uL — ABNORMAL LOW (ref 4.22–5.81)
RDW: 17.4 % — ABNORMAL HIGH (ref 11.5–15.5)
WBC: 7 10*3/uL (ref 4.0–10.5)
nRBC: 0 % (ref 0.0–0.2)

## 2022-04-06 LAB — BASIC METABOLIC PANEL
Anion gap: 7 (ref 5–15)
BUN: 21 mg/dL (ref 8–23)
CO2: 23 mmol/L (ref 22–32)
Calcium: 7.4 mg/dL — ABNORMAL LOW (ref 8.9–10.3)
Chloride: 106 mmol/L (ref 98–111)
Creatinine, Ser: 1.49 mg/dL — ABNORMAL HIGH (ref 0.61–1.24)
GFR, Estimated: 50 mL/min — ABNORMAL LOW (ref >60)
Glucose, Bld: 112 mg/dL — ABNORMAL HIGH (ref 70–99)
Potassium: 3.9 mmol/L (ref 3.5–5.1)
Sodium: 136 mmol/L (ref 135–145)

## 2022-04-06 LAB — SURGICAL PATHOLOGY

## 2022-04-06 LAB — MAGNESIUM: Magnesium: 1.9 mg/dL (ref 1.7–2.4)

## 2022-04-06 NOTE — Progress Notes (Signed)
Pt arrives via wheelchair from ICU, into bed without assist. Assessment completed. Tele applied, pt positioned for comfort. Denies further needs. Call bell in reach.

## 2022-04-06 NOTE — Progress Notes (Addendum)
Modified Barium Swallow Progress Note  Patient Details  Name: Alexander [name] MRN: [id number] Date of Birth: 05-Aug-1950  Today's Date: 04/06/2022  Modified Barium Swallow completed.  Full report located under Chart Review in the Imaging Section.  Brief recommendations include the following:  Clinical Impression  Patient is protecting his airway across all po intake beautifully.  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  Laryngeal penetration observed with all liquids--However pt is clearing with reflexive throat clearing and re-swallow.  He does not sense mild retention but is clearing his pharynx.  Pt able to swallow tablet - with liquids and transit it through oropharynx.   Barium tablet appeared to halt at mid-esophagus without pt awareness -and required several boluses of pudding and thin to help clear.  HOB reclined to 45* to help contain barium retention of liquids - marginally more effective to protect airway.  Head turn did not improve pharyngeal clearance consistently.  At this time, recommend pt continue diet with strict swallow precautions. Pt educated using teach back and video review.   Pt did NOT cough during MBS (cough x1 after exam) and did not aspirate despite sequential swallows of thin liquids.   Throat clearing throughout was coorelated to laryngeal penetration.     Swallow Evaluation Recommendations       SLP Diet Recommendations: Regular solids;Thin liquid (order softer foods!)   Liquid Administration via: Cup;Straw   Medication Administration: Whole meds with liquid (if small) Consider cut or crushed if large and not contraindicated.    Supervision: Patient able to self feed   Compensations: Slow rate;Small sips/bites       Oral Care Recommendations: Oral care BID     Alexander Lime, MS [facility] SLP Acute Rehab Services Office [phone] Pager  [phone]    Alexander [name] 04/06/2022,9:42 AM

## 2022-04-06 NOTE — Progress Notes (Signed)
Attempted to give report to nurse on 5E but they were busy.... call back number provided when they are ready for report.

## 2022-04-06 NOTE — Progress Notes (Signed)
[facility] Progress Note Patient: Alexander [name] GBT:[id number] DOB: [birth year] DOA: 04/03/2022  DOS: the patient was seen and examined on 04/06/2022  Brief hospital course: Mr. Washabaugh is a 72 yo male with PMH left lung cancer, oropharyngeal cancer (SCC), prostate cancer, GERD, CKD3b who presented with pain over his left chest wall and redness for 4 to 5 days and short of breath.  Also had left shoulder pain. Outpatient oncologist Dr. Delton Coombes and his last chemotherapy was gemcitabine from April through July 2023.  He has been on Degarelix and Abiraterone currently for prostate cancer treatment.  CTA chest then obtained which was negative for PE and showed complete atelectasis of left lung.  Underwent bronchoscopy at [facility], found to have mass obstructing left main bronchus.  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 Left pleural effusion Left lung atelectasis. Underwent endobronchial biopsy.  Results currently pending. Radiation oncology consulted, currently undergoing urgent XRT. Continue bronchodilators. 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  Left periclavicular and lower leg cellulitis. Patient has mild skin erythema and warmth on the left upper chest and shoulder area. Reports significant pain while raising his shoulder above 90 degree. MRI shoulder concerning for possible metastatic lesions.  Informed the patient about the findings. Currently on IV ceftriaxone.  Continue.  Goals of care conversation. PCCM involved, discussed with patient currently DNR/DNI. Okay for pressors.  Okay for ICU therapy.  Left lung squamous cell cancer. Metastatic prostate cancer. 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 Follows up with Dr. Raliegh Ip. Currently on therapy for prostate cancer  with Zytiga prednisone and degarelix. New multifocal bone mass noted in the pelvic spine and bilateral rib area as well as mediastinal and hilar lymphadenopathy as well as masses to left kidney. Patient will benefit from outpatient palliative therapy.  Hypotension. After bronchoscopy. Patient currently asymptomatic. Treated with IV fluid, now blood pressure is better.  monitor. Less likely septic.  Monitor.  Dysphagia. Speech therapy consulted.  Known history of dysphagia since 2016. Patient understand the risk or continuing current diet but does not want to change his diet consistency for now. MBS shows patient is able to compensate well for his dysphagia.  Monitor.  AKI on CKD 3B Baseline serum creatinine around 1.9-2.3. After receiving IV fluid currently serum creatinine 1.37. Monitor for now.  Iron deficiency anemia. H&H relatively stable. Monitor.  Underweight.  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  Placing the patient at high risk for poor outcome. Will consult pulm palliative care.   Subjective: Continues to have some cough.  No nausea no vomiting.  No fever no chills.  No chest pain.  Continues to have pain in his left shoulder area.  Physical Exam: General: in Mild distress, left-sided lower left neck erythema, no other rash Cardiovascular: S1 and S2 Present, No Murmur Respiratory: Good respiratory effort, Bilateral Air entry present.  Bilateral crackles, left-sided wheezes Abdomen: Bowel Sound present, No tenderness Extremities: No edema Neuro: Alert and oriented x3, no new focal deficit   Data Reviewed: I have Reviewed nursing notes, Vitals, and Lab results. Since last encounter, pertinent lab results CBC and BMP   . I have ordered test including CBC and BMP  .    Disposition: Status is: Inpatient Remains inpatient appropriate because: Still  need IV antibiotics   SCDs Start: 04/03/22 1858   Family  Communication: No one at bedside Level of care: Telemetry.  Patient improving.  Transfer to telemetry. Vitals:   04/06/22 1200 04/06/22 1300 04/06/22 1400 04/06/22 1523  BP: (!) 140/66 (!) 147/69  128/72  Pulse: 79 80 88 81  Resp: 15 17 (!) 28 20  Temp:    98.3 F (36.8 C)  TempSrc:      SpO2: 97% 99% 100% 100%  Weight:      Height:         Author: Berle Mull, MD 04/06/2022 6:55 PM  Please look on [email or web address] to find out who is on call.

## 2022-04-06 NOTE — Progress Notes (Signed)
Pt returns via bed from radiation. Denies needs. No distress noted. Call bell in reach.

## 2022-04-06 NOTE — Evaluation (Signed)
Physical Therapy Evaluation Patient Details Name: Alexander [name] MRN: [id number] DOB: Dec 13, 1950 Today's Date: 04/06/2022  History of Present Illness  [name] is a 72 yo male with PMH left lung cancer, oropharyngeal cancer (SCC), prostate cancer, GERD, CKD3b who presented 04/03/22 with pain over his left chest wall and redness for 4 to 5 days and short of breath.  Also had left shoulder pain. 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  Left pleural effusion  Left lung atelectasis.  Underwent endobronchial biopsy.    Radiation oncology consulted, currently undergoing urgent XRT  Clinical Impression  Pt admitted with above diagnosis.  Pt currently with functional limitations due to the deficits listed below (see PT Problem List). Pt will benefit from skilled PT to increase their independence and safety with mobility to allow discharge to the venue listed below.     The patient  reports independent and lives a lone PTA. Uses a cane. Reports that he has family and friend  for support. Patient  moving to anew room so ambulation limited, RN reports that patient has been up ad lib to BR , calls when back in bed to be reattached to monitor. . Continue PT for mobility and /dc recommendations.  Spo2 on  RA for  several minutes 98%. Replaced on 2 LPM.     Recommendations for follow up therapy are one component of a multi-disciplinary discharge planning process, led by the attending physician.  Recommendations may be updated based on patient status, additional functional criteria and insurance authorization.  Follow Up Recommendations No PT follow up      Assistance Recommended at Discharge Intermittent Supervision/Assistance  Patient can return home with the following  A little help with bathing/dressing/bathroom;Assistance with cooking/housework;Assist for transportation;Help with stairs or ramp for entrance    Equipment  Recommendations None recommended by PT  Recommendations for Other Services       Functional Status Assessment Patient has had a recent decline in their functional status and demonstrates the ability to make significant improvements in function in a reasonable and predictable amount of time.     Precautions / Restrictions Precautions Precautions: Fall Precaution Comments: monitor sats      Mobility  Bed Mobility Overal bed mobility: Independent                  Transfers Overall transfer level: Independent                      Ambulation/Gait Ambulation/Gait assistance: Supervision Gait Distance (Feet):  (x 2) Assistive device: None Gait Pattern/deviations: Step-through pattern       General Gait Details: llimited due to patient being transferred to another unit. per Rn, patient ambulates  in room to Br without assistnace.  Stairs            Wheelchair Mobility    Modified Rankin (Stroke Patients Only)       Balance Overall balance assessment: No apparent balance deficits (not formally assessed)                                           Pertinent Vitals/Pain Pain Assessment Pain Assessment: No/denies pain    Home Living Family/patient expects to be discharged to:: Private residence Living Arrangements: Alone Available Help at Discharge: Available PRN/intermittently;Friend(s);Family Type of Home: Apartment Home Access:  Level entry       Home Layout: One level Home Equipment: Cane - single point;Shower seat      Prior Function Prior Level of Function : Independent/Modified Independent             Mobility Comments: uses family/friends for transportation ADLs Comments: IADLs     Hand Dominance        Extremity/Trunk Assessment   Upper Extremity Assessment Upper Extremity Assessment: Defer to OT evaluation    Lower Extremity Assessment Lower Extremity Assessment: Overall WFL for tasks assessed     Cervical / Trunk Assessment Cervical / Trunk Assessment: Normal  Communication   Communication:  (mildly slurred, but understandable)  Cognition Arousal/Alertness: Awake/alert Behavior During Therapy: WFL for tasks assessed/performed Overall Cognitive Status: Within Functional Limits for tasks assessed                                          General Comments      Exercises     Assessment/Plan    PT Assessment Patient needs continued PT services  PT Problem List Decreased strength;Decreased activity tolerance;Decreased mobility;Decreased knowledge of precautions       PT Treatment Interventions DME instruction;Functional mobility training;Gait training;Therapeutic activities;Therapeutic exercise    PT Goals (Current goals can be found in the Care Plan section)  Acute Rehab PT Goals Patient Stated Goal: to go home PT Goal Formulation: With patient Time For Goal Achievement: 04/20/22 Potential to Achieve Goals: Good    Frequency Min 3X/week     Co-evaluation               AM-PAC PT "6 Clicks" Mobility  Outcome Measure Help needed turning from your back to your side while in a flat bed without using bedrails?: None Help needed moving from lying on your back to sitting on the side of a flat bed without using bedrails?: None Help needed moving to and from a bed to a chair (including a wheelchair)?: A Little Help needed standing up from a chair using your arms (e.g., wheelchair or bedside chair)?: A Little Help needed to walk in hospital room?: A Little Help needed climbing 3-5 steps with a railing? : A Lot 6 Click Score: 19    End of Session Equipment Utilized During Treatment: Oxygen Activity Tolerance: Patient tolerated treatment well Patient left: in bed;with nursing/sitter in room;with call bell/phone within reach Nurse Communication: Mobility status PT Visit Diagnosis: Difficulty in walking, not elsewhere classified (R26.2)    Time:  9702-6378 PT Time Calculation (min) (ACUTE ONLY): 13 min   Charges:   PT Evaluation $PT Eval Low Complexity: 1 Low          [place] Office [phone] Weekend pager-[phone]   Alexander [name] 04/06/2022, 2:29 PM

## 2022-04-06 NOTE — Progress Notes (Signed)
To radiation via bed. No distress noted.

## 2022-04-07 ENCOUNTER — Encounter (HOSPITAL_COMMUNITY): Payer: Self-pay | Admitting: Emergency Medicine

## 2022-04-07 ENCOUNTER — Other Ambulatory Visit: Payer: Self-pay

## 2022-04-07 ENCOUNTER — Ambulatory Visit
Admit: 2022-04-07 | Discharge: 2022-04-07 | Disposition: A | Discharge status: Home / Self Care | Payer: 59 | Attending: Radiation Oncology | Admitting: Radiation Oncology

## 2022-04-07 DIAGNOSIS — J9811 Atelectasis: Secondary | ICD-10-CM | POA: Diagnosis not present

## 2022-04-07 LAB — RAD ONC ARIA SESSION SUMMARY
Course Elapsed Days: 2
Plan Fractions Treated to Date: 3
Plan Prescribed Dose Per Fraction: 3 Gy
Plan Total Fractions Prescribed: 10
Plan Total Prescribed Dose: 30 Gy
Reference Point Dosage Given to Date: 9 Gy
Reference Point Session Dosage Given: 3 Gy
Session Number: 3

## 2022-04-07 LAB — BASIC METABOLIC PANEL
Anion gap: 9 (ref 5–15)
BUN: 18 mg/dL (ref 8–23)
CO2: 22 mmol/L (ref 22–32)
Calcium: 7.7 mg/dL — ABNORMAL LOW (ref 8.9–10.3)
Chloride: 101 mmol/L (ref 98–111)
Creatinine, Ser: 1.34 mg/dL — ABNORMAL HIGH (ref 0.61–1.24)
GFR, Estimated: 56 mL/min — ABNORMAL LOW (ref >60)
Glucose, Bld: 105 mg/dL — ABNORMAL HIGH (ref 70–99)
Potassium: 4 mmol/L (ref 3.5–5.1)
Sodium: 132 mmol/L — ABNORMAL LOW (ref 135–145)

## 2022-04-07 LAB — MAGNESIUM: Magnesium: 1.7 mg/dL (ref 1.7–2.4)

## 2022-04-07 MED ORDER — CEPHALEXIN 500 MG PO CAPS
500.0000 mg | ORAL_CAPSULE | Freq: Three times a day (TID) | ORAL | Status: DC
  Administered 2022-04-07 – 2022-04-08 (×2): 500 mg via ORAL
  Filled 2022-04-07 (×2): qty 1

## 2022-04-07 NOTE — Progress Notes (Signed)
[facility] Progress Note Patient: Alexander [name] HWN:[id number] DOB: [birth year] DOA: 04/03/2022  DOS: the patient was seen and examined on 04/07/2022  Brief hospital course: Alexander [name] is a 72 yo male with PMH left lung cancer, oropharyngeal cancer (SCC), prostate cancer, GERD, CKD3b who presented with pain over his left chest wall and redness for 4 to 5 days and short of breath.  Also had left shoulder pain. Outpatient oncologist Dr. Ellin [name] and his last chemotherapy was gemcitabine from April through July 2023.  He has been on Degarelix and Abiraterone currently for prostate cancer treatment.  CTA chest then obtained which was negative for PE and showed complete atelectasis of left lung.  Underwent bronchoscopy at [facility], found to have mass obstructing left main bronchus.  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 Left pleural effusion Left lung atelectasis. Underwent endobronchial biopsy.  Results Invasive moderately differentiated squamous cell carcinoma  Radiation oncology consulted, currently undergoing urgent XRT. Continue bronchodilators. 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  Left periclavicular and lower leg cellulitis. Patient has mild skin erythema and warmth on the left upper chest and shoulder area. Reports significant pain while raising his shoulder above 90 degree. MRI shoulder concerning for possible metastatic lesions.  Informed the patient about the findings. Currently on IV ceftriaxone.  Switch to oral Keflex.  Goals of care conversation. PCCM involved, discussed with patient currently DNR/DNI. Okay for pressors.  Okay for ICU therapy.  Left lung squamous cell cancer. Metastatic prostate cancer. 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 Follows up  with Dr. Kirtland [name]. Currently on therapy for prostate cancer with Zytiga prednisone and degarelix. New multifocal bone mass noted in the pelvic spine and bilateral rib area as well as mediastinal and hilar lymphadenopathy as well as masses to left kidney. Patient will benefit from outpatient palliative therapy.  Hypotension. After bronchoscopy. Patient currently asymptomatic. Treated with IV fluid, now blood pressure is better.  monitor. Less likely septic.  Monitor.  Dysphagia. Speech therapy consulted.  Known history of dysphagia since 2016. Patient understand the risk or continuing current diet but does not want to change his diet consistency for now. MBS shows patient is able to compensate well for his dysphagia.  Monitor.  AKI on CKD 3B Baseline serum creatinine around 1.9-2.3. After receiving IV fluid currently serum creatinine 1.37. Monitor for now.  Iron deficiency anemia. H&H relatively stable. Monitor.  Underweight.  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  Placing the patient at high risk for poor outcome. Will consult pulm palliative care.   Subjective: Symptoms are improving.  No nausea no vomiting no fever no chills.  Physical Exam: General: in Mild distress, No Rash, improving redness of the left neck area. Cardiovascular: S1 and S2 Present, No Murmur Respiratory: Good respiratory effort, Bilateral Air entry present. No Crackles, left-sided wheezes Abdomen: Bowel Sound present, No tenderness Extremities: No edema Neuro: Alert and oriented x3, no new focal deficit   Data Reviewed: I have Reviewed nursing notes, Vitals, and Lab results. Since last encounter, pertinent lab results CBC and BMP   . I have ordered test including BMP  .    Discussed with radiation oncology Disposition: Status is: Inpatient Remains inpatient appropriate because: Monitor for stability overnight, discharged on 1/13.  SCDs Start:  04/03/22 1858   Family Communication: No one at bedside Level of care: Telemetry.   Vitals:   04/06/22 2159 04/07/22 0530 04/07/22 1327 04/07/22 1518  BP: (!) 152/68 122/63  114/63  Pulse: 99 81  78  Resp: 20 18  18   Temp: 98.8 F (37.1 C) 98 F (36.7 C)    TempSrc:  Oral    SpO2: 98% 100% 96% 100%  Weight:      Height:         Author: Berle Mull, MD 04/07/2022 6:27 PM  Please look on [email or web address] to find out who is on call.

## 2022-04-07 NOTE — Progress Notes (Signed)
Mobility Specialist - Progress Note   04/07/22 1327  Oxygen Therapy  SpO2 96 %  O2 Device Room Air  Mobility  Activity Ambulated with assistance in hallway  Level of Assistance Modified independent, requires aide device or extra time  Assistive Device None  Distance Ambulated (ft) 200 ft  Activity Response Tolerated well  Mobility Referral Yes  $Mobility charge 1 Mobility   Nurse requested Mobility Specialist to perform oxygen saturation test with pt which includes removing pt from oxygen both at rest and while ambulating.  Below are the results from that testing.     Patient Saturations on Room Air at Rest = spO2 95%  Patient Saturations on Room Air while Ambulating = sp02 96% .    At end of testing pt left in room on 2  Liters of oxygen.  Reported results to nurse.    Pt received in chair and agreed to mobility, had no issues during session. Pt back to bed with all needs met.   Marilynne Halsted Mobility Specialist

## 2022-04-07 NOTE — Care Management Important Message (Signed)
Important Message  Patient Details  Name: Alexander [name] MRN: [id number] Date of Birth: [birth year]   Medicare Important Message Given:  Yes     Mardene Sayer 04/07/2022, 1:31 PM

## 2022-04-07 NOTE — Progress Notes (Addendum)
Occupational Therapy Evaluation Patient Details Name: Alexander [name] MRN: [id number] DOB: [birth year] Today's Date: 04/07/2022   History of Present Illness Patient is a 72 yr old male admitted to the hospital with chest pain, L shoulder pain, and shortness of breath. He was found to have L lung atelectasis. PMH: lung CA, oropharyngeal CA, prostate CA, CKD, GERD   Clinical Impression      [name] performed all assessed tasks with modified independence or better, including supine to sit, lower body dressing, sit to stand, and ambulating in the hall. His O2 saturation was noted to be 92% on room air with activity. He appears to be very near to his baseline level of functioning for self-care management. (Of note:pt's nurse contacted this OT after the session stating MD is requesting therapy educate the pt on L shoulder ROM and gentle stretches, as he is reporting discomfort and intermittent ROM limitations of the shoulder; MD believes such symptoms may be related to metastatic disease). OT anticipates the pt will only require this 1 treatment session & he can be discharged from acute OT services.    Recommendations for follow up therapy are one component of a multi-disciplinary discharge planning process, led by the attending physician.  Recommendations may be updated based on patient status, additional functional criteria and insurance authorization.   Follow Up Recommendations  (P) No OT follow up     Assistance Recommended at Discharge (P) PRN     Functional Status Assessment  (P) Patient has had a recent decline in their functional status and demonstrates the ability to make significant improvements in function in a reasonable and predictable amount of time.  Equipment Recommendations  (P) None recommended by OT       Precautions / Restrictions Restrictions Weight Bearing Restrictions: (P) No      Mobility Bed Mobility Overal bed mobility: (P) Modified Independent        Transfers Overall transfer level: (P) Independent           Balance Overall balance assessment: (P) No apparent balance deficits (not formally assessed)         ADL either performed or assessed with clinical judgement   ADL Overall ADL's : (P) Modified independent;Independent Eating/Feeding: (P) Independent Eating/Feeding Details (indicate cue type and reason): (P) based on clinical judgement Grooming: (P) Supervision/safety;Modified independent;Standing           Upper Body Dressing : (P) Independent;Sitting   Lower Body Dressing: (P) Independent Lower Body Dressing Details (indicate cue type and reason): (P) for sock management seated EOB Toilet Transfer: (P) Modified Independent Toilet Transfer Details (indicate cue type and reason): (P) at bathroom level                 Vision Patient Visual Report: (P) No change from baseline              Pertinent Vitals/Pain Pain Assessment Pain Assessment: 0-10 Pain Score: 5  Pain Location: (P) chronic bilateral knee pain Pain Intervention(s): Limited activity within patient's tolerance     Hand Dominance (P) Right      Communication Communication Communication: (P) No difficulties   Cognition Arousal/Alertness: (P) Awake/alert Behavior During Therapy: (P) WFL for tasks assessed/performed Overall Cognitive Status: (P) Within Functional Limits for tasks assessed            General Comments: (P) Oriented x4, able to follow commands without difficulty                Home  Living Family/patient expects to be discharged to:: (P) Private residence Living Arrangements: (P) Alone   Type of Home: (P) Apartment Home Access: (P) Level entry     Home Layout: (P) One level               Home Equipment: (P) Cane - single point          Prior Functioning/Environment Prior Level of Function : (P) Independent/Modified Independent             Mobility Comments: (P)  (He was independent with  ambulation.) ADLs Comments: (P)  (He reported being independent with ADLs, cooking, and cleaning. He does not drive.)        OT Problem List: (P) Decreased range of motion;Pain      OT Treatment/Interventions: (P) Self-care/ADL training;Therapeutic exercise;Therapeutic activities    OT Goals(Current goals can be found in the care plan section) Acute Rehab OT Goals OT Goal Formulation: (P) With patient Time For Goal Achievement: (P) 04/21/22 Potential to Achieve Goals: (P) Good ADL Goals Additional ADL Goal #1: The patient will perform L shoulder ROM and gentle stretches with supervision, to decrease discomfort and  to maximize upper extremity function needed for ADL completion.  OT Frequency: (P) Min 2X/week       AM-PAC OT "6 Clicks" Daily Activity     Outcome Measure Help from another person eating meals?: (P) None Help from another person taking care of personal grooming?: (P) None Help from another person toileting, which includes using toliet, bedpan, or urinal?: (P) None Help from another person bathing (including washing, rinsing, drying)?: (P) None Help from another person to put on and taking off regular upper body clothing?: (P) None Help from another person to put on and taking off regular lower body clothing?: (P) None 6 Click Score: (P) 24   End of Session Nurse Communication: (P) Mobility status  Activity Tolerance: (P) Patient tolerated treatment well Patient left: (P) in chair;with call bell/phone within reach  OT Visit Diagnosis: (P) Pain Pain - part of body: (P) Shoulder                Time:  -    Charges:       Reuben Likes, OTR/L 04/07/2022, 2:41 PM

## 2022-04-07 NOTE — Progress Notes (Signed)
  Transition of Care [facility]) Screening Note   Patient Details  Name: ADOLF ORMISTON Date of Birth: Aug 10, 1950   Transition of Care [facility]) CM/SW Contact:    Otelia Santee, LCSW Phone Number: 04/07/2022, 1:42 PM    Transition of Care Department [facility]) has reviewed patient and no TOC needs have been identified at this time. We will continue to monitor patient advancement through interdisciplinary progression rounds. If new patient transition needs arise, please place a TOC consult.

## 2022-04-08 DIAGNOSIS — J9811 Atelectasis: Secondary | ICD-10-CM | POA: Diagnosis not present

## 2022-04-08 LAB — BASIC METABOLIC PANEL
Anion gap: 10 (ref 5–15)
BUN: 19 mg/dL (ref 8–23)
CO2: 22 mmol/L (ref 22–32)
Calcium: 7.7 mg/dL — ABNORMAL LOW (ref 8.9–10.3)
Chloride: 102 mmol/L (ref 98–111)
Creatinine, Ser: 1.21 mg/dL (ref 0.61–1.24)
GFR, Estimated: >60 mL/min (ref >60)
Glucose, Bld: 95 mg/dL (ref 70–99)
Potassium: 4.1 mmol/L (ref 3.5–5.1)
Sodium: 134 mmol/L — ABNORMAL LOW (ref 135–145)

## 2022-04-08 LAB — MAGNESIUM: Magnesium: 1.6 mg/dL — ABNORMAL LOW (ref 1.7–2.4)

## 2022-04-08 MED ORDER — CEPHALEXIN 500 MG PO CAPS: 500.0000 mg | ORAL_CAPSULE | Freq: Three times a day (TID) | ORAL | 0 refills | Status: AC

## 2022-04-08 MED ORDER — MAGNESIUM SULFATE 2 GM/50ML IV SOLN
2.0000 g | Freq: Once | INTRAVENOUS | Status: AC
  Administered 2022-04-08: 2 g via INTRAVENOUS
  Filled 2022-04-08: qty 50

## 2022-04-08 MED ORDER — HEPARIN SOD (PORK) LOCK FLUSH 100 UNIT/ML IV SOLN
500.0000 [IU] | INTRAVENOUS | Status: AC | PRN
  Administered 2022-04-08: 500 [IU]
  Filled 2022-04-08: qty 5

## 2022-04-08 MED ORDER — POLYETHYLENE GLYCOL 3350 17 G PO PACK: 17.0000 g | PACK | Freq: Every day | ORAL | 0 refills | Status: AC | PRN

## 2022-04-08 NOTE — Progress Notes (Signed)
Occupational Therapy Treatment Patient Details Name: Alexander [name] MRN: [id number] DOB: Jan 02, 1951 Today's Date: 04/08/2022   History of present illness Patient is a 72 yr old male admitted to the hospital with chest pain, L shoulder pain, and shortness of breath. He was found to have L lung atelectasis. PMH: lung CA, oropharyngeal CA, prostate CA, CKD, GERD   OT comments  Patient demonstrates functional shoulder AROM - though decreased compared to right. Has pain with above shoulder ROM. Patient educated on ROM exercises to perform in order to maintain ROM of shoulder and reduce stiffness. No resistance exercises recommended at this time as no therapist to follow up in case of pain. Patient has multiple pathologies leading to pain in left shoulder -- therefore conservative ROM exercises recommended and therapist suggested active assist for any above shoulder ROM -- as continued ROM with pain typically results in increased pain and irritation of site. Patient verbalized understanding.    Recommendations for follow up therapy are one component of a multi-disciplinary discharge planning process, led by the attending physician.  Recommendations may be updated based on patient status, additional functional criteria and insurance authorization.    Follow Up Recommendations  No OT follow up     Assistance Recommended at Discharge    Patient can return home with the following      Equipment Recommendations  None recommended by OT    Recommendations for Other Services      Precautions / Restrictions Precautions Precautions: Fall Precaution Comments: monitor sats Restrictions Weight Bearing Restrictions: No        ADL either performed or assessed with clinical judgement   ADL                                              Extremity/Trunk Assessment Upper Extremity Assessment Upper Extremity Assessment: LUE deficits/detail LUE Deficits / Details: Approx 120  degrees shoulder flexion - reports pain with AROM - strength not tested. Otherwsie functional LUE. LUE Sensation: WNL LUE Coordination: WNL             Cognition Arousal/Alertness: Awake/alert Behavior During Therapy: WFL for tasks assessed/performed Overall Cognitive Status: Within Functional Limits for tasks assessed                                          Exercises Other Exercises Other Exercises: Educated on active assist exercsie for above shoulder ROM in order to reduce pain with movement and limit further pain and injury. Other Exercises: Educated on limited shoulder flexion reps and external rotation reps to maintain shoulder ROM but limit resistance.    Shoulder Instructions  Limit overhead reaching     General Comments      Pertinent Vitals/ Pain       Pain Assessment Pain Assessment: Faces Faces Pain Scale: Hurts a little bit Pain Location: L shoulder Pain Descriptors / Indicators: Aching Pain Intervention(s): Monitored during session   Frequency  Min 2X/week        Progress Toward Goals  OT Goals(current goals can now be found in the care plan section)  Progress towards OT goals: Progressing toward goals  Acute Rehab OT Goals OT Goal Formulation: With patient Time For Goal Achievement: 04/21/22 Potential to Achieve Goals: Good  Plan Discharge plan  remains appropriate    Co-evaluation                 AM-PAC OT "6 Clicks" Daily Activity     Outcome Measure   Help from another person eating meals?: None Help from another person taking care of personal grooming?: None Help from another person toileting, which includes using toliet, bedpan, or urinal?: None Help from another person bathing (including washing, rinsing, drying)?: None Help from another person to put on and taking off regular upper body clothing?: None Help from another person to put on and taking off regular lower body clothing?: None 6 Click Score:  24    End of Session    OT Visit Diagnosis: Pain Pain - part of body: Shoulder   Activity Tolerance Patient tolerated treatment well   Patient Left in bed;with call bell/phone within reach   Nurse Communication Mobility status        Time: 1154-1200 OT Time Calculation (min): 6 min  Charges: OT General Charges $OT Visit: 1 Visit (no charge)  Donnella Sham, OTR/L Acute Care Rehab Services  Office [phone]   Kelli Churn 04/08/2022, 12:50 PM

## 2022-04-08 NOTE — Plan of Care (Signed)

## 2022-04-08 NOTE — Plan of Care (Signed)

## 2022-04-10 ENCOUNTER — Other Ambulatory Visit: Payer: 59

## 2022-04-10 ENCOUNTER — Ambulatory Visit: Payer: 59

## 2022-04-10 ENCOUNTER — Inpatient Hospital Stay: Payer: 59 | Admitting: Hematology

## 2022-04-10 ENCOUNTER — Inpatient Hospital Stay: Payer: 59

## 2022-04-10 ENCOUNTER — Other Ambulatory Visit: Payer: Self-pay | Admitting: Hematology

## 2022-04-10 ENCOUNTER — Other Ambulatory Visit: Payer: Self-pay | Admitting: *Deleted

## 2022-04-10 DIAGNOSIS — C61 Malignant neoplasm of prostate: Secondary | ICD-10-CM

## 2022-04-10 MED ORDER — HYDROCODONE-ACETAMINOPHEN 10-325 MG PO TABS: ORAL_TABLET | ORAL | 0 refills | Status: AC

## 2022-04-10 NOTE — Discharge Summary (Signed)
Physician Discharge Summary   Patient: Alexander [name] MRN: [id number] DOB: 1951/03/04  Admit date:     04/03/2022  Discharge date: 04/08/2022  Discharge Physician: Alexander [name]  PCP: Alexander Morgan, MD  Recommendations at discharge:  Follow up for radiation  Pt will benefit from outpt Palliative care referral   Follow-up Information     Alexander Morgan, MD. Schedule an appointment as soon as possible for a visit in 1 week(s).   Specialty: Family Medicine Contact information: [address] [place] [address] [phone]         Alexander Peak, MD. Call on 04/10/2022.   Specialty: Radiation Oncology Contact information: [address] [place] [address] [phone]                Discharge Diagnoses: Principal Problem:   Collapse of left lung Active Problems:   Squamous cell lung cancer, left (HCC)   Cellulitis   Prostate cancer (HCC)   Hypotension   Oropharyngeal carcinoma (HCC)   Iron deficiency anemia   CKD (chronic kidney disease), stage III (HCC)   Lung mass  Hospital Course: [name] is a 72 yo male with PMH left lung cancer, oropharyngeal cancer (SCC), prostate cancer, GERD, CKD3b who presented with pain over his left chest wall and redness for 4 to 5 days and short of breath.  Also had left shoulder pain. Outpatient oncologist Dr. Ellin [name] and his last chemotherapy was gemcitabine from April through July 2023.  He has been on Degarelix and Abiraterone currently for prostate cancer treatment.  CTA chest then obtained which was negative for PE and showed complete atelectasis of left lung.  Underwent bronchoscopy at [facility], found to have mass obstructing left main bronchus.  Pt was at [facility] for radiation.   Assessment and Plan  Endobronchial tumor with complete occlusion of the left mainstem and 50% occlusion of the right mainstem. Left pleural effusion Left lung atelectasis. Underwent endobronchial biopsy.  Results Invasive  moderately differentiated squamous cell carcinoma  Radiation oncology consulted, currently undergoing urgent XRT. Continue bronchodilators. 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   Left periclavicular and lower leg cellulitis. Patient has mild skin erythema and warmth on the left upper chest and shoulder area. Reports significant pain while raising his shoulder above 90 degree. MRI shoulder concerning for possible metastatic lesions.  Informed the patient about the findings. Was on IV ceftriaxone.  Switch to oral Keflex.   Goals of care conversation. PCCM involved, discussed with patient currently DNR/DNI. Okay for pressors.  Okay for ICU therapy.   Left lung squamous cell cancer. Metastatic prostate cancer. 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 Follows up with Dr. Kirtland [name]. Currently on therapy for prostate cancer with Zytiga prednisone and degarelix. New multifocal bone mass noted in the pelvic spine and bilateral rib area as well as mediastinal and hilar lymphadenopathy as well as masses to left kidney. Patient will benefit from outpatient palliative therapy.   Hypotension. After bronchoscopy. Patient currently asymptomatic. Treated with IV fluid, now blood pressure is better.  monitor. Less likely septic.  Monitor.   Dysphagia. Speech therapy consulted.  Known history of dysphagia since 2016. Patient understand the risk or continuing current diet but does not want to change his diet consistency for now. MBS shows patient is able to compensate well for his dysphagia.  Monitor.   AKI on CKD 3B Baseline serum creatinine around 1.9-2.3.  After receiving IV fluid currently serum creatinine 1.37. Monitor for now.   Iron deficiency anemia. H&H relatively stable. Monitor.   Underweight.  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  Placing the  patient at high risk for poor outcome.  Pain control - [name] Controlled Substance Reporting System database was reviewed. and patient was instructed, not to drive, operate heavy machinery, perform activities at heights, swimming or participation in water activities or provide baby-sitting services while on Pain, Sleep and Anxiety Medications; until their outpatient Physician has advised to do so again. Also recommended to not to take more than prescribed Pain, Sleep and Anxiety Medications.  Consultants:  PCCM  Rad Oncology   Procedures performed:  Radiation Bronchoscopy  DISCHARGE MEDICATION: Allergies as of 04/08/2022   No Known Allergies      Medication List     TAKE these medications    abiraterone acetate 250 MG tablet Commonly known as: ZYTIGA Take 4 tablets (1,000 mg total) by mouth daily. Take on an empty stomach 1 hour before or 2 hours after a meal   albuterol 108 (90 Base) MCG/ACT inhaler Commonly known as: VENTOLIN HFA Inhale 2 puffs into the lungs every 6 (six) hours as needed for wheezing or shortness of breath.   CALCIUM 500 + D PO Take by mouth.   cephALEXin 500 MG capsule Commonly known as: KEFLEX Take 1 capsule (500 mg total) by mouth 3 (three) times daily for 3 days.   GEMZAR IV Inject into the vein once a week. Days 1 & 8 q 21 days   guaiFENesin-dextromethorphan 100-10 MG/5ML syrup Commonly known as: ROBITUSSIN DM Take 10 mLs by mouth every 8 (eight) hours as needed for cough.   levothyroxine 88 MCG tablet Commonly known as: SYNTHROID Take 1 tablet (88 mcg total) by mouth daily before breakfast.   omeprazole 20 MG capsule Commonly known as: PRILOSEC Take 1 capsule (20 mg total) by mouth daily.   ondansetron 8 MG tablet Commonly known as: ZOFRAN Take by mouth.   polyethylene glycol 17 g packet Commonly known as: MIRALAX / GLYCOLAX Take 17 g by mouth daily as needed for mild constipation.   predniSONE 5 MG tablet Commonly known  as: DELTASONE Take 1 tablet (5 mg total) by mouth daily with breakfast.   sildenafil 100 MG tablet Commonly known as: VIAGRA Take 1 tablet (100 mg total) by mouth as needed for erectile dysfunction.   tamsulosin 0.4 MG Caps capsule Commonly known as: FLOMAX Take 1 capsule (0.4 mg total) by mouth 2 (two) times daily.       Disposition: Home Diet recommendation: Regular diet  Discharge Exam: Vitals:   04/07/22 1327 04/07/22 1518 04/07/22 1943 04/08/22 0605  BP:  114/63 (!) 127/56 (!) 115/53  Pulse:  78 89 80  Resp:  18 18 18   Temp:   98.1 F (36.7 C) 97.6 F (36.4 C)  TempSrc:   Oral Oral  SpO2: 96% 100% 92% 97%  Weight:      Height:       General: Appear in no distress; no visible Abnormal Neck Mass Or lumps, Conjunctiva normal Cardiovascular: S1 and S2 Present, no Murmur, Respiratory: good respiratory effort, Bilateral Air entry present and faint Crackles, Occasional  wheezes Abdomen: Bowel Sound present, Non tender  Extremities: no Pedal edema Neurology: alert and oriented to time, place, and person  Filed Weights   04/03/22 1049  Weight: 54.4 kg   Condition at  discharge: stable  The results of significant diagnostics from this hospitalization (including imaging, microbiology, ancillary and laboratory) are listed below for reference.   Imaging Studies: DG Swallowing Func-Speech Pathology  Result Date: 04/06/2022 Table formatting from the original result was not included. Objective Swallowing Evaluation: Type of Study: MBS-Modified Barium Swallow Study  Patient Details Name: CHRISTPOHER SIEVERS MRN: [id number] Date of Birth: [birth year] Today's Date: 04/06/2022 Time: SLP Start Time (ACUTE ONLY): 0815 -SLP Stop Time (ACUTE ONLY): 0849 SLP Time Calculation (min) (ACUTE ONLY): 34 min Past Medical History: Past Medical History: Diagnosis Date  GERD (gastroesophageal reflux disease)   Mass of neck   dx. oropharyngeal squamous cell carcinoma- Chemo. radiation planned  Oropharyngeal  cancer ([place]) 07/28/2014  dx. 3 weeks ago.- Dr. Oneal [facility] [place], [place].  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[place]) 08/06/2014  SCCa of Left BOT Past Surgical History: Past Surgical History: Procedure Laterality Date  BIOPSY  01/15/2018  Procedure: BIOPSY;  Surgeon: Danie Binder, MD;  [facility];  Service: Endoscopy;;  gastric  COLONOSCOPY N/A 03/13/2016  Procedure: COLONOSCOPY;  Surgeon: Danie Binder, MD;  [facility];  Service: Endoscopy;  Laterality: N/A;  2:15 PM  CYSTOSCOPY N/A 08/29/2021  Procedure: CYSTOSCOPY;  Surgeon: Cleon Gustin, MD;  Location: [facility];  Service: Urology;  Laterality: N/A;  CYSTOSCOPY WITH INSERTION OF UROLIFT N/A 06/27/2021  Procedure: CYSTOSCOPY WITH INSERTION OF UROLIFT;  Surgeon: Cleon Gustin, MD;  Location: [facility];  Service: Urology;  Laterality: N/A;  ESOPHAGOGASTRODUODENOSCOPY (EGD) WITH PROPOFOL N/A 08/17/2014  Procedure: ESOPHAGOGASTRODUODENOSCOPY (EGD) WITH PROPOFOL (procedure #1);  Surgeon: Aviva Signs Md, MD;  Location: [facility];  Service: General;  Laterality: N/A;  ESOPHAGOGASTRODUODENOSCOPY (EGD) WITH PROPOFOL N/A 01/15/2018  Procedure: ESOPHAGOGASTRODUODENOSCOPY (EGD) WITH PROPOFOL;  Surgeon: Danie Binder, MD;  [facility];  Service: Endoscopy;  Laterality: N/A;  9:30am  MULTIPLE EXTRACTIONS WITH ALVEOLOPLASTY N/A 08/12/2014  Procedure: Extraction of tooth #'s 6,17,22,23,24,25,26,27 with alveoloplasty;  Surgeon: Lenn Cal, DDS;  Location: [facility];  Service: Oral Surgery;  Laterality: N/A;  PANENDOSCOPY N/A 08/06/2014  Procedure: PANENDOSCOPY WITH BIOPSY;  Surgeon: Leta Baptist, MD;  Location: [facility];  Service: ENT;  Laterality: N/A;  PEG PLACEMENT Left 08/17/14  PEG PLACEMENT N/A 08/17/2014  Procedure: PERCUTANEOUS ENDOSCOPIC GASTROSTOMY (PEG) PLACEMENT (procedure #1);  Surgeon: Aviva Signs Md, MD;  Location: [facility];  Service: General;  Laterality: N/A;  PORT-A-CATH REMOVAL Right 07/17/2016   Procedure: MINOR REMOVAL PORT-A-CATH;  Surgeon: Aviva Signs, MD;  Location: [facility];  Service: General;  Laterality: Right;  PORTACATH PLACEMENT Right 08/17/14  PORTACATH PLACEMENT Right 08/17/2014  Procedure: INSERTION PORT-A-CATH (procedure #2);  Surgeon: Aviva Signs Md, MD;  Location[facility];  Service: General;  Laterality: Right;  PORTACATH PLACEMENT Left 04/26/2018  Procedure: INSERTION PORT-A-CATH (attached catheter in left subclavian);  Surgeon: Aviva Signs, MD;  Location[facility];  Service: General;  Laterality: Left;  SAVORY DILATION N/A 01/15/2018  Procedure: SAVORY DILATION;  Surgeon: Danie Binder, MD;  [facility];  Service: Endoscopy;  Laterality: N/A;  TRANSURETHRAL RESECTION OF PROSTATE N/A 08/29/2021  Procedure: TRANSURETHRAL RESECTION OF THE PROSTATE (TURP);  Surgeon: Cleon Gustin, MD;  Location: [facility];  Service: Urology;  Laterality: N/A;  VIDEO BRONCHOSCOPY WITH ENDOBRONCHIAL ULTRASOUND N/A 04/15/2018  Procedure: VIDEO BRONCHOSCOPY WITH ENDOBRONCHIAL ULTRASOUND;  Surgeon: Melrose Nakayama, MD;  Location: [facility] OR;  Service: Thoracic;  Laterality: N/A; HPI: Per CCM note - "72 year old  male with history of 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  Completed 6 cycles chemotherapy 04/2021, degarelix, abiraterone started 10/2021.  PET scan 09/2021 showed narrowing of his left mainstem bronchus but with no overt occlusion.  He did have new multifocal skeletal metastases, radiotracer avid mediastinal and hilar lymphadenopathy, right hydronephrosis with possible local extension of prostate cancer level of the right distal ureter, intense prostate gland hypermetabolism.  He began to experience pain in his left clavicular area between his port and the left neck about 5 days prior to admission.  Then progressive shortness of  breath and some left-sided chest discomfort beginning 3 days prior to admission.  CT-PA without any evidence of pulmonary embolism but did show complete left atelectasis, and associated free-flowing left effusion, left mainstem bronchus cutoff sign."  Pt is s/p bronch.  Imaging also showed "There is air-fluid level in thoracic esophagus suggesting  gastroesophageal reflux. "  Pt reports he takes a PPI =  MBS indicated - and pt agreeable.  Subjective: pt awake in flouro chair  Recommendations for follow up therapy are one component of a multi-disciplinary discharge planning process, led by the attending physician.  Recommendations may be updated based on patient status, additional functional criteria and insurance authorization. Assessment / Plan / Recommendation   04/06/2022   9:18 AM Clinical Impressions Clinical Impression Patient is protecting his airway across all po intake beautifully.  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  Laryngeal penetration observed with all liquids--However pt is clearing with reflexive throat clearing and re-swallow.  He does not sense mild retention but is clearing his pharynx.  Pt able to swallow tablet - with liquids and transit it through oropharynx.   Barium tablet appeared to halt at mid-esophagus without pt awareness -and required several boluses of pudding and thin to help clear.  HOB reclined to 45* to help contain barium retention of liquids - marginally more effective to protect airway. Head turn left did not improve pharyngeal clearance consistently.  At this time, recommend pt continue diet with strict swallow precautions. Pt educated using teach back and video review.  Swallow Evaluation Recommendations     SLP Visit Diagnosis Dysphagia, pharyngoesophageal phase (R13.14);Dysphagia, pharyngeal phase (R13.13) Impact on safety and function Mild aspiration  risk     04/06/2022   9:18 AM Treatment Recommendations Treatment Recommendations Therapy as outlined in treatment plan below     04/06/2022   9:39 AM Prognosis Prognosis for Safe Diet Advancement Good Barriers to Reach Goals Time post onset   04/06/2022   9:18 AM Diet Recommendations SLP Diet Recommendations Regular solids;Thin liquid Liquid Administration via Cup;Straw Medication Administration Whole meds with liquid Compensations Slow rate;Small sips/bites     04/06/2022   9:18 AM Other Recommendations Oral Care Recommendations Oral care BID Follow Up Recommendations No SLP follow up Functional Status Assessment Patient has had a recent decline in their functional status and demonstrates the ability to make significant improvements in function in a reasonable and predictable amount of time.   04/06/2022   9:18 AM Frequency and Duration  Speech Therapy Frequency (ACUTE ONLY) min 1 x/week Treatment Duration 1 week     04/06/2022   9:13 AM Oral Phase Oral Phase [facility]    04/06/2022   9:13 AM Pharyngeal Phase Pharyngeal Phase Impaired  Pharyngeal- Nectar Cup Pharyngeal residue - valleculae;Pharyngeal residue - pyriform;Reduced tongue base retraction;Reduced epiglottic inversion Pharyngeal Material does not enter airway Pharyngeal- Thin Cup Penetration/Aspiration during swallow;Reduced anterior laryngeal mobility;Reduced laryngeal elevation;Reduced airway/laryngeal closure;Reduced tongue base retraction;Pharyngeal residue - pyriform;Pharyngeal residue - valleculae;Delayed swallow initiation-vallecula;Delayed swallow initiation-pyriform sinuses;Reduced epiglottic inversion Pharyngeal Material enters airway, remains ABOVE vocal cords then ejected out Pharyngeal- Thin Straw Reduced epiglottic inversion;Reduced tongue base retraction;Penetration/Aspiration during swallow Pharyngeal Material enters airway, remains ABOVE vocal cords then ejected out Pharyngeal- Puree Reduced pharyngeal peristalsis;Reduced epiglottic inversion;Reduced  tongue base retraction;Pharyngeal residue - valleculae Pharyngeal Material does not enter airway Pharyngeal- Mechanical Soft Reduced epiglottic inversion;Reduced pharyngeal peristalsis;Reduced tongue base retraction;Pharyngeal residue - valleculae Pharyngeal Material does not enter airway Pharyngeal- Pill WFL Pharyngeal Material does not enter airway    04/06/2022   9:18 AM Cervical Esophageal Phase  Cervical Esophageal Phase WFL Rolena Infante, MS [facility] SLP Acute Rehab Services Office [phone] Pager [phone] Chales Abrahams 04/06/2022, 9:55 AM                     MR SHOULDER LEFT W WO CONTRAST  Result Date: 04/06/2022 CLINICAL DATA:  History of multiple cancers including prostate cancer with bone metastasis. One week history of left shoulder pain and decreased range of motion. EXAM: MRI OF THE LEFT SHOULDER WITHOUT AND WITH CONTRAST TECHNIQUE: Multiplanar, multisequence MR imaging of the left shoulder was performed before and after the administration of intravenous contrast. CONTRAST:  5.30mL GADAVIST GADOBUTROL 1 MMOL/ML IV SOLN COMPARISON:  Chest CT 04/03/2022 FINDINGS: Examination is somewhat limited as the fat saturation was ineffective. Rotator cuff: Mild rotator tendinopathy/tendinosis but no full-thickness retracted rotator cuff tear. Muscles: Diffuse edema like signal changes in the shoulder musculature. There is also fluid and inflammation surrounding the muscles most notably in the axilla. Biceps long head:  Intact Acromioclavicular Joint: Advanced AC joint arthropathy. Surrounding inflammatory changes. Type 2 acromion. No lateral downsloping or subacromial spurring. Glenohumeral Joint: Moderate degenerative chondrosis. No joint effusion. Labrum:  Superior and posterior labral tears. Bones: Very heterogeneous marrow signal with patchy areas of marrow enhancement most noted in the humeral shaft suspicious for metastatic bone disease. Other: No subacromial/subdeltoid fluid collections to suggest  bursitis. IMPRESSION: 1. Very heterogeneous marrow signal with patchy areas of marrow enhancement most notable in the humeral shaft suspicious for metastatic bone disease. 2. Mild rotator cuff tendinopathy/tendinosis but no full-thickness retracted rotator cuff tear. 3. Intact long head biceps tendon. 4. Superior and posterior labral tears. 5. Advanced AC joint arthropathy with surrounding inflammatory changes. 6. Diffuse body wall edema. Electronically Signed   By: Rudie Meyer M.D.   On: 04/06/2022 06:29   CT Angio Chest PE W and/or Wo Contrast  Result Date: 04/03/2022 CLINICAL DATA:  History of lung carcinoma, metastatic prostate carcinoma, chest pain and increased shortness of breath x3 days EXAM: CT ANGIOGRAPHY CHEST WITH CONTRAST TECHNIQUE: Multidetector CT imaging of the chest was performed using the standard protocol during bolus administration of intravenous contrast. Multiplanar CT image reconstructions and MIPs were obtained to evaluate the vascular anatomy. RADIATION DOSE REDUCTION: This exam was performed according to the departmental dose-optimization program which includes automated exposure control, adjustment of the mA and/or kV according to patient size and/or use of iterative reconstruction technique. CONTRAST:  53mL OMNIPAQUE IOHEXOL 350 MG/ML SOLN COMPARISON:  Previous CT done on 10/03/2021, chest radiograph done earlier today FINDINGS: Cardiovascular: There is homogeneous enhancement in thoracic aorta. There are no intraluminal filling defects in pulmonary artery branches. There  is homogeneous enhancement in thoracic aorta. Major branches of thoracic aorta appear patent. Scattered coronary artery calcifications are seen. Mediastinum/Nodes: There are enlarged lymph nodes in mediastinum with interval worsening. Largest of the nodes is noted in aortopulmonary window measuring 2.7 cm in short axis. There is air-fluid level in thoracic esophagus. Lungs/Pleura: There is complete atelectasis in  left lung. Evaluation of left lung for infiltrates or nodules is limited by complete atelectasis. There is no demonstrable air in left main bronchus and its branches. There is soft tissue attenuation in the lumen of left main bronchus. There is large left pleural effusion. Scattered blebs and bullae are seen in right lung. There are no infiltrates or discrete nodules in right lung. There is pleural thickening in the right apex, more so in the anterior aspect which has not changed significantly. There is no pneumothorax. Upper Abdomen: There is fullness of collecting systems in both kidneys. Ureters are not included in the study. There are abnormally enlarged lymph nodes in the retrocrural region and upper abdomen. There are few scattered low-density foci, possibly cysts or hemangiomas in the liver. Largest of these lesions measures 2.6 cm. Musculoskeletal: Slightly displaced fracture is seen in the posterior aspect of left twelfth rib. Sclerosis seen in the left eighth rib may suggest metastatic disease. There are a few sclerotic lesions in thoracic spine. Severe degenerative changes are noted in the lower cervical spine. Review of the MIP images confirms the above findings. IMPRESSION: There is no evidence of pulmonary embolism. There is no evidence of thoracic aortic dissection. Coronary artery disease. There is complete atelectasis of left lung. There is soft tissue density in the lumen of left main bronchus in the mediastinum. This may be due to large mucous plug in the left main bronchus or neoplastic infiltration. Endoscopic correlation should be considered. Large left pleural effusion. COPD. Right lung is clear. There is significant interval worsening of lymphadenopathy in mediastinum. There are abnormally enlarged lymph nodes in upper abdomen. Findings suggest progression of metastatic lymphadenopathy. There is air-fluid level in thoracic esophagus suggesting gastroesophageal reflux. There is slightly  displaced fracture in the posterior left twelfth rib. There is fullness of collecting systems in both kidneys. This finding is not fully evaluated. Other findings as described in the body of the report. Electronically Signed   By: Ernie Avena M.D.   On: 04/03/2022 13:14   DG Chest 2 View  Result Date: 04/03/2022 CLINICAL DATA:  History of lung cancer with increased shortness of breath EXAM: CHEST - 2 VIEW COMPARISON:  Chest radiograph dated 12/29/2021 FINDINGS: Left chest wall port tip projects over the superior cavoatrial junction. Right lung is hyperinflated. Complete opacification of the left lung with leftward deviation of the airways and mediastinum. Left lower lobe nodular mass is better evaluated on prior CT. No pneumothorax. The cardiac contours are obscured. The visualized skeletal structures are unremarkable. IMPRESSION: Complete opacification of the left lung with leftward deviation of the airways and mediastinum, likely due to atelectasis. Superimposed consolidation or pleural effusion can not be excluded. Electronically Signed   By: Agustin Cree M.D.   On: 04/03/2022 11:19    Microbiology: Results for orders placed or performed during the hospital encounter of 04/03/22  MRSA Next Gen by PCR, Nasal     Status: None   Collection Time: 04/04/22  6:28 PM   Specimen: Nasal Mucosa; Nasal Swab  Result Value Ref Range Status   MRSA by PCR Next Gen NOT DETECTED NOT DETECTED Final  Comment: (NOTE) The GeneXpert MRSA Assay (FDA approved for NASAL specimens only), is one component of a comprehensive MRSA colonization surveillance program. It is not intended to diagnose MRSA infection nor to guide or monitor treatment for MRSA infections. Test performance is not FDA approved in patients less than 26 years old. Performed at [facility], [place] [address], [place], [place] [address]    Labs: CBC: Recent Labs  Lab 04/04/22 0336 04/05/22 0748 04/06/22 0504  WBC 5.9  5.7  5.7 7.0  NEUTROABS  --  4.4  4.4 5.7  HGB 8.4* 8.2*  8.2* 8.1*  HCT 26.2* 26.4*  26.4* 26.2*  MCV 84.8 86.8  86.8 87.3  PLT 209 200  200 158   Basic Metabolic Panel: Recent Labs  Lab 04/04/22 0336 04/05/22 0748 04/06/22 0504 04/07/22 0245 04/08/22 0350  NA 133* 134*  134* 136 132* 134*  K 3.9 3.8  3.8 3.9 4.0 4.1  CL 101 105  105 106 101 102  CO2 22 23  23 23 22 22   GLUCOSE 102* 97  97 112* 105* 95  BUN 22 20  20 21 18 19   CREATININE 1.70* 1.37*  1.37* 1.49* 1.34* 1.21  CALCIUM 7.3* 7.1*  7.1* 7.4* 7.7* 7.7*  MG  --  1.8  1.8 1.9 1.7 1.6*   Liver Function Tests: No results for input(s): "AST", "ALT", "ALKPHOS", "BILITOT", "PROT", "ALBUMIN" in the last 168 hours. CBG: No results for input(s): "GLUCAP" in the last 168 hours.  Discharge time spent: greater than 30 minutes.  Signed: Berle Mull, MD [facility] 04/08/2022

## 2022-04-11 ENCOUNTER — Telehealth: Payer: Self-pay

## 2022-04-11 ENCOUNTER — Ambulatory Visit: Payer: 59

## 2022-04-11 NOTE — Telephone Encounter (Signed)
Rn attempted to call pt for a second time without success. Voicemail was left for pt again. Rn also tried to call pt friend that was listed as well with no success.

## 2022-04-11 NOTE — Telephone Encounter (Signed)
Rn called pt again when he did not show up for his radiation appointment. Another phone message was left for pt to call us back.

## 2022-04-11 NOTE — Telephone Encounter (Signed)
RN Victorino Dike called for welfare check on pt due to pt not coming to appointments and not answering his phone as well. 911 operator stated they had a car in the area that could check on him and let us know he is ok. Rn awaiting a call back.

## 2022-04-11 NOTE — Telephone Encounter (Signed)
RN attempted to call to see if he will make his appointment for radiation therapy today. Pt missed appointment yesterday and staff was unable to get a hold of him. Rn will try to call again this am. Voicemail was left for pt.

## 2022-04-12 ENCOUNTER — Telehealth: Payer: Self-pay

## 2022-04-12 ENCOUNTER — Ambulatory Visit: Payer: 59

## 2022-04-12 NOTE — Telephone Encounter (Signed)
Rn Victorino Dike attempted to call pt again after he missed his third straight radiation treatment. Police did do a welfare check yesterday and the pt was ok just weak, EMS was dispatched to  home as well per voicemail left by officer. Voice mail left for for again. Pt friend also not able to be reached as well with full voicemail. Rn will discuss this Dr. Basilio Cairo to see what she would like to do with further treatments.

## 2022-04-12 NOTE — Telephone Encounter (Signed)
[place] [employer] called back following welfare check. Pt was ok though weak. EMS was called due to his weakness. Rn will attempt to call him again today.

## 2022-04-13 ENCOUNTER — Telehealth: Payer: Self-pay

## 2022-04-13 ENCOUNTER — Ambulatory Visit: Payer: 59

## 2022-04-13 ENCOUNTER — Other Ambulatory Visit: Payer: Self-pay

## 2022-04-13 ENCOUNTER — Other Ambulatory Visit (HOSPITAL_COMMUNITY): Payer: Self-pay

## 2022-04-13 NOTE — Progress Notes (Signed)
The proposed treatment discussed in conference is for discussion purpose only and is not a binding recommendation.  The patients have not been physically examined, or presented with their treatment options.  Therefore, final treatment plans cannot be decided.  

## 2022-04-13 NOTE — Telephone Encounter (Signed)
Rn called pt and left voicemail when he did not show up for his radiation appointment. Alternative contact also attempted but voicemail was full.

## 2022-04-14 ENCOUNTER — Other Ambulatory Visit (HOSPITAL_COMMUNITY): Payer: Self-pay

## 2022-04-14 ENCOUNTER — Telehealth: Payer: Self-pay

## 2022-04-14 ENCOUNTER — Ambulatory Visit: Payer: 59

## 2022-04-14 NOTE — Telephone Encounter (Signed)
Rn left message on pt voicemail due to pt not showing up for radiation treatment. Rn has left several messages for pt and he has not contacted [place] back. His back up contact also was not available and the voicemail was full. L2 informed of this information.

## 2022-04-15 ENCOUNTER — Encounter: Payer: Self-pay | Admitting: Hematology

## 2022-04-17 ENCOUNTER — Telehealth: Payer: Self-pay

## 2022-04-17 ENCOUNTER — Ambulatory Visit: Payer: 59

## 2022-04-17 ENCOUNTER — Other Ambulatory Visit (HOSPITAL_COMMUNITY): Payer: Self-pay

## 2022-04-17 NOTE — Telephone Encounter (Signed)
Rn attempted to call pt when he missed his radiation appointment. A voice mail was left for the pt. Rn has left several messages in the past as well.

## 2022-04-18 ENCOUNTER — Ambulatory Visit: Payer: 59

## 2022-04-18 ENCOUNTER — Inpatient Hospital Stay: Payer: 59

## 2022-04-18 VITALS — BP 77/37

## 2022-04-18 VITALS — BP 70/35 | HR 79 | Temp 96.5°F | Resp 18

## 2022-04-18 DIAGNOSIS — C61 Malignant neoplasm of prostate: Secondary | ICD-10-CM

## 2022-04-18 DIAGNOSIS — C7902 Secondary malignant neoplasm of left kidney and renal pelvis: Secondary | ICD-10-CM | POA: Diagnosis not present

## 2022-04-18 DIAGNOSIS — E039 Hypothyroidism, unspecified: Secondary | ICD-10-CM | POA: Diagnosis not present

## 2022-04-18 DIAGNOSIS — R7989 Other specified abnormal findings of blood chemistry: Secondary | ICD-10-CM

## 2022-04-18 DIAGNOSIS — D631 Anemia in chronic kidney disease: Secondary | ICD-10-CM | POA: Diagnosis not present

## 2022-04-18 DIAGNOSIS — Z95828 Presence of other vascular implants and grafts: Secondary | ICD-10-CM

## 2022-04-18 DIAGNOSIS — Z85810 Personal history of malignant neoplasm of tongue: Secondary | ICD-10-CM | POA: Diagnosis not present

## 2022-04-18 DIAGNOSIS — Z79890 Hormone replacement therapy (postmenopausal): Secondary | ICD-10-CM | POA: Diagnosis not present

## 2022-04-18 DIAGNOSIS — Z79891 Long term (current) use of opiate analgesic: Secondary | ICD-10-CM | POA: Diagnosis not present

## 2022-04-18 DIAGNOSIS — N189 Chronic kidney disease, unspecified: Secondary | ICD-10-CM | POA: Diagnosis not present

## 2022-04-18 DIAGNOSIS — Z9221 Personal history of antineoplastic chemotherapy: Secondary | ICD-10-CM | POA: Diagnosis not present

## 2022-04-18 DIAGNOSIS — C7951 Secondary malignant neoplasm of bone: Secondary | ICD-10-CM | POA: Diagnosis not present

## 2022-04-18 DIAGNOSIS — C3492 Malignant neoplasm of unspecified part of left bronchus or lung: Secondary | ICD-10-CM | POA: Diagnosis not present

## 2022-04-18 DIAGNOSIS — Z7952 Long term (current) use of systemic steroids: Secondary | ICD-10-CM | POA: Diagnosis not present

## 2022-04-18 DIAGNOSIS — Z87891 Personal history of nicotine dependence: Secondary | ICD-10-CM | POA: Diagnosis not present

## 2022-04-18 DIAGNOSIS — Z923 Personal history of irradiation: Secondary | ICD-10-CM | POA: Diagnosis not present

## 2022-04-18 LAB — COMPREHENSIVE METABOLIC PANEL
ALT: 11 U/L (ref 0–44)
AST: 19 U/L (ref 15–41)
Albumin: 2.5 g/dL — ABNORMAL LOW (ref 3.5–5.0)
Alkaline Phosphatase: 69 U/L (ref 38–126)
Anion gap: 13 (ref 5–15)
BUN: 42 mg/dL — ABNORMAL HIGH (ref 8–23)
CO2: 24 mmol/L (ref 22–32)
Calcium: 7.5 mg/dL — ABNORMAL LOW (ref 8.9–10.3)
Chloride: 100 mmol/L (ref 98–111)
Creatinine, Ser: 3.62 mg/dL — ABNORMAL HIGH (ref 0.61–1.24)
GFR, Estimated: 17 mL/min — ABNORMAL LOW (ref >60)
Glucose, Bld: 109 mg/dL — ABNORMAL HIGH (ref 70–99)
Potassium: 4.3 mmol/L (ref 3.5–5.1)
Sodium: 137 mmol/L (ref 135–145)
Total Bilirubin: 0.5 mg/dL (ref 0.3–1.2)
Total Protein: 6.4 g/dL — ABNORMAL LOW (ref 6.5–8.1)

## 2022-04-18 LAB — CBC WITH DIFFERENTIAL/PLATELET
Abs Immature Granulocytes: 0.02 10*3/uL (ref 0.00–0.07)
Basophils Absolute: 0 10*3/uL (ref 0.0–0.1)
Basophils Relative: 0 %
Eosinophils Absolute: 0.1 10*3/uL (ref 0.0–0.5)
Eosinophils Relative: 2 %
HCT: 26.7 % — ABNORMAL LOW (ref 39.0–52.0)
Hemoglobin: 8.4 g/dL — ABNORMAL LOW (ref 13.0–17.0)
Immature Granulocytes: 0 %
Lymphocytes Relative: 8 %
Lymphs Abs: 0.5 10*3/uL — ABNORMAL LOW (ref 0.7–4.0)
MCH: 27.1 pg (ref 26.0–34.0)
MCHC: 31.5 g/dL (ref 30.0–36.0)
MCV: 86.1 fL (ref 80.0–100.0)
Monocytes Absolute: 0.5 10*3/uL (ref 0.1–1.0)
Monocytes Relative: 9 %
Neutro Abs: 4.6 10*3/uL (ref 1.7–7.7)
Neutrophils Relative %: 81 %
Platelets: 253 10*3/uL (ref 150–400)
RBC: 3.1 MIL/uL — ABNORMAL LOW (ref 4.22–5.81)
RDW: 17.4 % — ABNORMAL HIGH (ref 11.5–15.5)
WBC: 5.8 10*3/uL (ref 4.0–10.5)
nRBC: 0 % (ref 0.0–0.2)

## 2022-04-18 LAB — MAGNESIUM: Magnesium: 2.8 mg/dL — ABNORMAL HIGH (ref 1.7–2.4)

## 2022-04-18 MED ORDER — SODIUM CHLORIDE 0.9% FLUSH
10.0000 mL | INTRAVENOUS | Status: DC | PRN
  Administered 2022-04-18: 10 mL via INTRAVENOUS

## 2022-04-18 MED ORDER — HEPARIN SOD (PORK) LOCK FLUSH 100 UNIT/ML IV SOLN
500.0000 [IU] | Freq: Once | INTRAVENOUS | Status: AC
  Administered 2022-04-18: 500 [IU] via INTRAVENOUS

## 2022-04-18 MED ORDER — DENOSUMAB 120 MG/1.7ML ~~LOC~~ SOLN
120.0000 mg | Freq: Once | SUBCUTANEOUS | Status: AC
  Administered 2022-04-18: 120 mg via SUBCUTANEOUS
  Filled 2022-04-18: qty 1.7

## 2022-04-18 MED ORDER — MAGNESIUM SULFATE 2 GM/50ML IV SOLN
2.0000 g | Freq: Once | INTRAVENOUS | Status: AC
  Administered 2022-04-18: 2 g via INTRAVENOUS
  Filled 2022-04-18: qty 50

## 2022-04-18 MED ORDER — DEGARELIX ACETATE 80 MG ~~LOC~~ SOLR
80.0000 mg | Freq: Once | SUBCUTANEOUS | Status: AC
  Administered 2022-04-18: 80 mg via SUBCUTANEOUS
  Filled 2022-04-18: qty 4

## 2022-04-18 MED ORDER — POTASSIUM CHLORIDE IN NACL 20-0.9 MEQ/L-% IV SOLN
Freq: Once | INTRAVENOUS | Status: AC
  Filled 2022-04-18: qty 1000

## 2022-04-18 MED ORDER — SODIUM CHLORIDE 0.9 % IV SOLN: Freq: Once | INTRAVENOUS | Status: AC

## 2022-04-18 NOTE — Progress Notes (Signed)
Patient presents today for Rivka Barbara and Firmagon injections.  Patient is feeling weak and lightheaded today.  BP today is 70/35.  All other vitals are stable.  MD made aware of BP.  We will give house IVF over 2 hours today per Dr. Ellin Saba.    Patient tolerated Deborra Medina and Xgeva injections with no complaints voiced.  Site clean and dry with no bruising or swelling noted.  No complaints of pain.   Creatinine today is 3.62.  Patient will receive NS 500 mL over 1 hour in addition to the house fluids per Dr. Ellin Saba.  Patient will return tomorrow for repeat labs.    Patient tolerated fluids well with no complaints voiced.  Patient left via wheelchair in stable condition.  Vital signs stable at discharge.  Follow up as scheduled.

## 2022-04-18 NOTE — Patient Instructions (Signed)
[facility]  Discharge Instructions: Thank you for choosing [facility] to provide your oncology and hematology care.  If you have a lab appointment with the Cancer Center, please come in thru the Main Entrance and check in at the main information desk.  Wear comfortable clothing and clothing appropriate for easy access to any Portacath or PICC line.   We strive to give you quality time with your provider. You may need to reschedule your appointment if you arrive late (15 or more minutes).  Arriving late affects you and other patients whose appointments are after yours.  Also, if you miss three or more appointments without notifying the office, you may be dismissed from the clinic at the provider's discretion.      For prescription refill requests, have your pharmacy contact our office and allow 72 hours for refills to be completed.    To help prevent nausea and vomiting after your treatment, we encourage you to take your nausea medication as directed.  BELOW ARE SYMPTOMS THAT SHOULD BE REPORTED IMMEDIATELY: *FEVER GREATER THAN 100.4 F (38 C) OR HIGHER *CHILLS OR SWEATING *NAUSEA AND VOMITING THAT IS NOT CONTROLLED WITH YOUR NAUSEA MEDICATION *UNUSUAL SHORTNESS OF BREATH *UNUSUAL BRUISING OR BLEEDING *URINARY PROBLEMS (pain or burning when urinating, or frequent urination) *BOWEL PROBLEMS (unusual diarrhea, constipation, pain near the anus) TENDERNESS IN MOUTH AND THROAT WITH OR WITHOUT PRESENCE OF ULCERS (sore throat, sores in mouth, or a toothache) UNUSUAL RASH, SWELLING OR PAIN  UNUSUAL VAGINAL DISCHARGE OR ITCHING   Items with * indicate a potential emergency and should be followed up as soon as possible or go to the Emergency Department if any problems should occur.  Please show the CHEMOTHERAPY ALERT CARD or IMMUNOTHERAPY ALERT CARD at check-in to the Emergency Department and triage nurse.  Should you have questions after your visit or need to  cancel or reschedule your appointment, please contact [facility] [phone]  and follow the prompts.  Office hours are 8:00 a.m. to 4:30 p.m. Monday - Friday. Please note that voicemails left after 4:00 p.m. may not be returned until the following business day.  We are closed weekends and major holidays. You have access to a nurse at all times for urgent questions. Please call the main number to the clinic [phone] and follow the prompts.  For any non-urgent questions, you may also contact your provider using MyChart. We now offer e-Visits for anyone 18 and older to request care online for non-urgent symptoms. For details visit [email or web address].   Also download the MyChart app! Go to the app store, search "MyChart", open the app, select [place], and log in with your MyChart username and password.   

## 2022-04-18 NOTE — Progress Notes (Signed)
Patients port flushed without difficulty.  Good blood return noted with no bruising or swelling noted at site. Patient remains accessed.  

## 2022-04-19 ENCOUNTER — Other Ambulatory Visit: Payer: Self-pay

## 2022-04-19 ENCOUNTER — Other Ambulatory Visit (HOSPITAL_COMMUNITY): Payer: Self-pay

## 2022-04-19 ENCOUNTER — Ambulatory Visit: Payer: 59

## 2022-04-19 ENCOUNTER — Telehealth: Payer: Self-pay | Admitting: Radiation Oncology

## 2022-04-19 ENCOUNTER — Other Ambulatory Visit: Payer: Medicaid Other

## 2022-04-19 DIAGNOSIS — R7989 Other specified abnormal findings of blood chemistry: Secondary | ICD-10-CM

## 2022-04-19 NOTE — Telephone Encounter (Signed)
DICOM records request received via fax and given to dosimetry team 04/19/21

## 2022-04-20 ENCOUNTER — Ambulatory Visit: Payer: 59

## 2022-04-20 ENCOUNTER — Other Ambulatory Visit: Payer: Self-pay

## 2022-04-20 DIAGNOSIS — C3492 Malignant neoplasm of unspecified part of left bronchus or lung: Secondary | ICD-10-CM

## 2022-04-20 NOTE — Radiation Completion Notes (Signed)
Patient Name: Alexander [name], Alexander [name] MRN: [id number] Date of Birth: 12/16/1950 Referring Physician: Dwyane Dee, M.D. Date of Service: 2022-04-20 Radiation Oncologist: Eppie Gibson, M.D. 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

## 2022-04-20 NOTE — Progress Notes (Signed)
LCSW referral placed

## 2022-04-21 ENCOUNTER — Ambulatory Visit: Payer: 59

## 2022-04-24 ENCOUNTER — Ambulatory Visit: Payer: 59

## 2022-04-25 ENCOUNTER — Ambulatory Visit: Payer: 59

## 2022-04-26 ENCOUNTER — Ambulatory Visit: Payer: 59

## 2022-05-02 ENCOUNTER — Encounter: Payer: Self-pay | Admitting: Hematology

## 2022-05-04 ENCOUNTER — Inpatient Hospital Stay: Payer: 59 | Attending: Hematology | Admitting: Licensed Clinical Social Worker

## 2022-05-04 ENCOUNTER — Telehealth: Payer: Self-pay | Admitting: *Deleted

## 2022-05-04 ENCOUNTER — Inpatient Hospital Stay: Payer: 59

## 2022-05-04 ENCOUNTER — Ambulatory Visit (HOSPITAL_COMMUNITY): Admission: RE | Admit: 2022-05-04 | Payer: 59 | Source: Ambulatory Visit

## 2022-05-04 ENCOUNTER — Inpatient Hospital Stay: Payer: 59 | Admitting: Licensed Clinical Social Worker

## 2022-05-04 DIAGNOSIS — C61 Malignant neoplasm of prostate: Secondary | ICD-10-CM

## 2022-05-04 NOTE — Progress Notes (Signed)
[place] CSW Progress Note  Clinical Social Worker  attempted to contact pt to assess w/ no answer.  CSW was able to reach pt's friend Alexander [name] [phone]).  Per Alexander [name] pt recently has fallen and she has not been able to get out of the house on his own.  Alexander [name] reports she was unaware of the scan that pt was scheduled to have today and would be unable to go to his apartment due to a prior commitment, but if it can be rescheduled for tomorrow she will try to get pt to the appointment.    Medical team informed of the above to reschedule scan and contact Alexander [name] with the details.  CSW to follow up w/ pt after the scan is complete.    Henriette Combs, LCSW

## 2022-05-04 NOTE — Telephone Encounter (Signed)
Received call this morning from friend [name] to advise that patient was not going to be able to come to his CT today, as he fell yesterday and does not feel like getting out.  She stated that she would check on him and call me once she was at his home.  Upon arrival, she noted that he had fallen again today.  He was moaning in the background and said that he had fallen on his buttocks this morning and it hurt for a while, however had no pain at this time.  I suggested that he go to the ER to be evaluated for injury.  He refused to do so.  Will reschedule CT scan at his convenience.

## 2022-05-10 ENCOUNTER — Ambulatory Visit: Payer: Medicaid Other

## 2022-05-10 ENCOUNTER — Ambulatory Visit: Payer: Medicaid Other | Admitting: Hematology

## 2022-05-11 ENCOUNTER — Ambulatory Visit (HOSPITAL_COMMUNITY): Payer: 59

## 2022-05-16 ENCOUNTER — Other Ambulatory Visit: Payer: Medicaid Other

## 2022-05-16 ENCOUNTER — Ambulatory Visit: Payer: Medicaid Other

## 2022-05-16 ENCOUNTER — Ambulatory Visit: Payer: 59 | Admitting: Hematology

## 2022-05-23 ENCOUNTER — Ambulatory Visit: Payer: Self-pay | Admitting: Radiation Oncology

## 2022-05-26 DEATH — deceased

## 2022-06-05 ENCOUNTER — Other Ambulatory Visit (HOSPITAL_COMMUNITY): Payer: 59

## 2022-06-07 ENCOUNTER — Ambulatory Visit: Payer: 59 | Admitting: Hematology

## 2022-06-13 ENCOUNTER — Ambulatory Visit: Payer: 59

## 2022-06-13 ENCOUNTER — Other Ambulatory Visit: Payer: 59
